# Patient Record
Sex: Male | Born: 1935 | Race: White | Hispanic: No | Smoking: Former smoker
Health system: Southern US, Community
[De-identification: ages and names within clinical notes are randomized; demographics above are authoritative.]

## PROBLEM LIST (undated history)

## (undated) DIAGNOSIS — M359 Systemic involvement of connective tissue, unspecified: Secondary | ICD-10-CM

## (undated) DIAGNOSIS — I10 Essential (primary) hypertension: Secondary | ICD-10-CM

## (undated) DIAGNOSIS — I509 Heart failure, unspecified: Secondary | ICD-10-CM

## (undated) DIAGNOSIS — C801 Malignant (primary) neoplasm, unspecified: Secondary | ICD-10-CM

## (undated) HISTORY — DX: Malignant (primary) neoplasm, unspecified: C80.1

## (undated) HISTORY — PX: APPENDECTOMY: SHX54

## (undated) HISTORY — PX: OTHER SURGICAL HISTORY: SHX169

## (undated) HISTORY — PX: PACEMAKER INSERTION: SHX728

## (undated) HISTORY — PX: CRANIOTOMY: SHX93

## (undated) HISTORY — PX: INGUINAL HERNIA REPAIR: SHX194

## (undated) HISTORY — PX: COLONOSCOPY: SHX5424

## (undated) HISTORY — DX: Essential (primary) hypertension: I10

---

## 1998-08-09 ENCOUNTER — Ambulatory Visit (HOSPITAL_COMMUNITY): Admission: RE | Admit: 1998-08-09 | Discharge: 1998-08-09 | Payer: Self-pay | Admitting: Internal Medicine

## 1998-08-09 ENCOUNTER — Encounter: Payer: Self-pay | Admitting: Internal Medicine

## 1999-11-19 ENCOUNTER — Encounter: Payer: Self-pay | Admitting: Internal Medicine

## 1999-11-19 ENCOUNTER — Ambulatory Visit (HOSPITAL_COMMUNITY): Admission: RE | Admit: 1999-11-19 | Discharge: 1999-11-19 | Payer: Self-pay | Admitting: Internal Medicine

## 2000-08-11 ENCOUNTER — Ambulatory Visit (HOSPITAL_COMMUNITY): Admission: RE | Admit: 2000-08-11 | Discharge: 2000-08-12 | Payer: Self-pay

## 2000-11-24 ENCOUNTER — Emergency Department (HOSPITAL_COMMUNITY): Admission: EM | Admit: 2000-11-24 | Discharge: 2000-11-24 | Payer: Self-pay | Admitting: *Deleted

## 2000-11-24 ENCOUNTER — Encounter: Payer: Self-pay | Admitting: *Deleted

## 2000-11-26 ENCOUNTER — Emergency Department (HOSPITAL_COMMUNITY): Admission: EM | Admit: 2000-11-26 | Discharge: 2000-11-26 | Payer: Self-pay | Admitting: Emergency Medicine

## 2000-11-28 ENCOUNTER — Emergency Department (HOSPITAL_COMMUNITY): Admission: EM | Admit: 2000-11-28 | Discharge: 2000-11-28 | Payer: Self-pay | Admitting: *Deleted

## 2000-12-08 ENCOUNTER — Emergency Department (HOSPITAL_COMMUNITY): Admission: EM | Admit: 2000-12-08 | Discharge: 2000-12-08 | Payer: Self-pay | Admitting: *Deleted

## 2002-02-25 ENCOUNTER — Encounter: Payer: Self-pay | Admitting: Internal Medicine

## 2002-02-25 ENCOUNTER — Ambulatory Visit (HOSPITAL_COMMUNITY): Admission: RE | Admit: 2002-02-25 | Discharge: 2002-02-26 | Payer: Self-pay | Admitting: Internal Medicine

## 2002-02-26 ENCOUNTER — Encounter: Payer: Self-pay | Admitting: Internal Medicine

## 2003-04-08 ENCOUNTER — Ambulatory Visit (HOSPITAL_COMMUNITY): Admission: RE | Admit: 2003-04-08 | Discharge: 2003-04-09 | Payer: Self-pay | Admitting: Cardiology

## 2004-02-08 ENCOUNTER — Ambulatory Visit: Payer: Self-pay | Admitting: *Deleted

## 2004-02-24 ENCOUNTER — Ambulatory Visit: Payer: Self-pay | Admitting: Gastroenterology

## 2004-03-02 ENCOUNTER — Ambulatory Visit: Payer: Self-pay | Admitting: Internal Medicine

## 2004-03-14 ENCOUNTER — Ambulatory Visit: Payer: Self-pay | Admitting: Gastroenterology

## 2004-03-14 ENCOUNTER — Ambulatory Visit (HOSPITAL_COMMUNITY): Admission: RE | Admit: 2004-03-14 | Discharge: 2004-03-14 | Payer: Self-pay | Admitting: Gastroenterology

## 2004-05-29 ENCOUNTER — Ambulatory Visit: Payer: Self-pay | Admitting: Internal Medicine

## 2004-05-31 ENCOUNTER — Ambulatory Visit: Payer: Self-pay | Admitting: Internal Medicine

## 2004-06-13 ENCOUNTER — Ambulatory Visit: Payer: Self-pay | Admitting: *Deleted

## 2004-06-13 ENCOUNTER — Ambulatory Visit: Payer: Self-pay | Admitting: Internal Medicine

## 2004-08-30 ENCOUNTER — Ambulatory Visit: Payer: Self-pay | Admitting: Internal Medicine

## 2004-10-10 ENCOUNTER — Ambulatory Visit: Payer: Self-pay | Admitting: *Deleted

## 2004-10-31 ENCOUNTER — Ambulatory Visit: Payer: Self-pay | Admitting: Internal Medicine

## 2004-10-31 ENCOUNTER — Ambulatory Visit: Payer: Self-pay

## 2004-11-23 ENCOUNTER — Ambulatory Visit: Payer: Self-pay | Admitting: Internal Medicine

## 2004-11-30 ENCOUNTER — Ambulatory Visit: Payer: Self-pay | Admitting: Internal Medicine

## 2005-01-24 ENCOUNTER — Ambulatory Visit: Payer: Self-pay | Admitting: Internal Medicine

## 2005-02-05 ENCOUNTER — Ambulatory Visit: Payer: Self-pay | Admitting: *Deleted

## 2005-03-01 ENCOUNTER — Ambulatory Visit: Payer: Self-pay | Admitting: Internal Medicine

## 2005-03-05 ENCOUNTER — Ambulatory Visit: Payer: Self-pay | Admitting: Internal Medicine

## 2005-05-02 ENCOUNTER — Ambulatory Visit: Payer: Self-pay | Admitting: Internal Medicine

## 2005-05-28 ENCOUNTER — Ambulatory Visit: Payer: Self-pay | Admitting: Internal Medicine

## 2005-05-30 ENCOUNTER — Ambulatory Visit: Payer: Self-pay | Admitting: Internal Medicine

## 2005-06-05 ENCOUNTER — Ambulatory Visit: Payer: Self-pay | Admitting: *Deleted

## 2005-06-07 ENCOUNTER — Ambulatory Visit: Payer: Self-pay | Admitting: Internal Medicine

## 2005-06-27 ENCOUNTER — Emergency Department (HOSPITAL_COMMUNITY): Admission: EM | Admit: 2005-06-27 | Discharge: 2005-06-28 | Payer: Self-pay | Admitting: Emergency Medicine

## 2005-07-02 ENCOUNTER — Ambulatory Visit: Payer: Self-pay | Admitting: *Deleted

## 2005-07-16 ENCOUNTER — Ambulatory Visit: Payer: Self-pay | Admitting: *Deleted

## 2005-07-30 ENCOUNTER — Ambulatory Visit: Payer: Self-pay | Admitting: Internal Medicine

## 2005-08-06 ENCOUNTER — Ambulatory Visit: Payer: Self-pay | Admitting: Internal Medicine

## 2005-09-05 ENCOUNTER — Ambulatory Visit: Payer: Self-pay | Admitting: Internal Medicine

## 2005-09-09 ENCOUNTER — Ambulatory Visit: Payer: Self-pay | Admitting: Internal Medicine

## 2005-09-11 ENCOUNTER — Ambulatory Visit: Payer: Self-pay | Admitting: *Deleted

## 2005-09-13 ENCOUNTER — Inpatient Hospital Stay (HOSPITAL_COMMUNITY): Admission: RE | Admit: 2005-09-13 | Discharge: 2005-09-14 | Payer: Self-pay | Admitting: Internal Medicine

## 2005-09-13 ENCOUNTER — Ambulatory Visit: Payer: Self-pay | Admitting: Internal Medicine

## 2005-11-05 ENCOUNTER — Ambulatory Visit: Payer: Self-pay | Admitting: Internal Medicine

## 2005-11-26 ENCOUNTER — Ambulatory Visit: Payer: Self-pay | Admitting: Internal Medicine

## 2005-11-28 ENCOUNTER — Ambulatory Visit: Payer: Self-pay | Admitting: Internal Medicine

## 2005-12-03 ENCOUNTER — Ambulatory Visit: Payer: Self-pay | Admitting: Internal Medicine

## 2005-12-10 ENCOUNTER — Ambulatory Visit: Payer: Self-pay | Admitting: Internal Medicine

## 2005-12-27 ENCOUNTER — Ambulatory Visit: Payer: Self-pay | Admitting: Internal Medicine

## 2006-01-14 ENCOUNTER — Ambulatory Visit: Payer: Self-pay | Admitting: *Deleted

## 2006-01-15 ENCOUNTER — Encounter: Admission: RE | Admit: 2006-01-15 | Discharge: 2006-01-15 | Payer: Self-pay | Admitting: Nephrology

## 2006-01-28 ENCOUNTER — Encounter: Payer: Self-pay | Admitting: Cardiology

## 2006-01-28 ENCOUNTER — Ambulatory Visit: Payer: Self-pay

## 2006-02-04 ENCOUNTER — Emergency Department (HOSPITAL_COMMUNITY): Admission: EM | Admit: 2006-02-04 | Discharge: 2006-02-05 | Payer: Self-pay | Admitting: Emergency Medicine

## 2006-02-05 ENCOUNTER — Ambulatory Visit: Payer: Self-pay | Admitting: Internal Medicine

## 2006-02-07 ENCOUNTER — Ambulatory Visit: Payer: Self-pay

## 2006-02-10 ENCOUNTER — Ambulatory Visit: Payer: Self-pay | Admitting: Cardiology

## 2006-02-10 ENCOUNTER — Inpatient Hospital Stay (HOSPITAL_COMMUNITY): Admission: RE | Admit: 2006-02-10 | Discharge: 2006-02-13 | Payer: Self-pay | Admitting: Urology

## 2006-02-10 ENCOUNTER — Encounter (INDEPENDENT_AMBULATORY_CARE_PROVIDER_SITE_OTHER): Payer: Self-pay | Admitting: *Deleted

## 2006-02-28 ENCOUNTER — Ambulatory Visit: Payer: Self-pay | Admitting: Internal Medicine

## 2006-05-05 ENCOUNTER — Ambulatory Visit: Payer: Self-pay | Admitting: *Deleted

## 2006-05-05 ENCOUNTER — Ambulatory Visit: Payer: Self-pay | Admitting: Internal Medicine

## 2006-05-29 ENCOUNTER — Ambulatory Visit: Payer: Self-pay | Admitting: Internal Medicine

## 2006-05-29 LAB — CONVERTED CEMR LAB
BUN: 29 mg/dL — ABNORMAL HIGH (ref 6–23)
Creatinine, Ser: 1.7 mg/dL — ABNORMAL HIGH (ref 0.4–1.5)
TSH: 3.72 microintl units/mL (ref 0.35–5.50)

## 2006-06-06 ENCOUNTER — Ambulatory Visit: Payer: Self-pay | Admitting: Internal Medicine

## 2006-08-05 ENCOUNTER — Ambulatory Visit: Payer: Self-pay | Admitting: *Deleted

## 2006-08-12 ENCOUNTER — Ambulatory Visit: Payer: Self-pay | Admitting: Internal Medicine

## 2006-08-26 ENCOUNTER — Ambulatory Visit: Payer: Self-pay | Admitting: Internal Medicine

## 2006-08-26 ENCOUNTER — Ambulatory Visit: Payer: Self-pay | Admitting: *Deleted

## 2006-08-26 LAB — CONVERTED CEMR LAB
ALT: 25 units/L (ref 0–40)
AST: 28 units/L (ref 0–37)
BUN: 29 mg/dL — ABNORMAL HIGH (ref 6–23)
Bilirubin, Direct: 0.2 mg/dL (ref 0.0–0.3)
Calcium: 8.9 mg/dL (ref 8.4–10.5)
Potassium: 4.2 meq/L (ref 3.5–5.1)
Sed Rate: 27 mm/hr — ABNORMAL HIGH (ref 0–20)
Sodium: 137 meq/L (ref 135–145)
Total Bilirubin: 1.1 mg/dL (ref 0.3–1.2)
Vit D, 1,25-Dihydroxy: 50 (ref 20–57)

## 2006-09-04 ENCOUNTER — Ambulatory Visit: Payer: Self-pay | Admitting: Internal Medicine

## 2006-09-09 ENCOUNTER — Ambulatory Visit: Payer: Self-pay | Admitting: Family Medicine

## 2006-11-11 ENCOUNTER — Ambulatory Visit: Payer: Self-pay | Admitting: Internal Medicine

## 2006-11-12 ENCOUNTER — Ambulatory Visit: Payer: Self-pay | Admitting: Internal Medicine

## 2006-11-12 LAB — CONVERTED CEMR LAB
Calcium: 9.5 mg/dL (ref 8.4–10.5)
Creatinine, Ser: 1.8 mg/dL — ABNORMAL HIGH (ref 0.4–1.5)
GFR calc Af Amer: 47 mL/min
Glucose, Bld: 96 mg/dL (ref 70–99)
Sodium: 138 meq/L (ref 135–145)

## 2007-01-07 ENCOUNTER — Encounter: Payer: Self-pay | Admitting: Internal Medicine

## 2007-01-08 DIAGNOSIS — E039 Hypothyroidism, unspecified: Secondary | ICD-10-CM

## 2007-01-08 DIAGNOSIS — I10 Essential (primary) hypertension: Secondary | ICD-10-CM

## 2007-01-30 ENCOUNTER — Encounter: Payer: Self-pay | Admitting: Internal Medicine

## 2007-02-10 ENCOUNTER — Ambulatory Visit: Payer: Self-pay | Admitting: Internal Medicine

## 2007-02-13 ENCOUNTER — Ambulatory Visit: Payer: Self-pay | Admitting: Internal Medicine

## 2007-02-13 LAB — CONVERTED CEMR LAB
Alkaline Phosphatase: 66 units/L (ref 39–117)
Calcium: 9.2 mg/dL (ref 8.4–10.5)
Cholesterol: 117 mg/dL (ref 0–200)
Creatinine, Ser: 2 mg/dL — ABNORMAL HIGH (ref 0.4–1.5)
Glucose, Bld: 92 mg/dL (ref 70–99)
Sodium: 139 meq/L (ref 135–145)
Total Bilirubin: 1 mg/dL (ref 0.3–1.2)
Total CHOL/HDL Ratio: 3.3
Total Protein: 6.8 g/dL (ref 6.0–8.3)
Triglycerides: 59 mg/dL (ref 0–149)

## 2007-03-09 ENCOUNTER — Ambulatory Visit: Payer: Self-pay | Admitting: Internal Medicine

## 2007-03-09 LAB — CONVERTED CEMR LAB
Chloride: 102 meq/L (ref 96–112)
GFR calc Af Amer: 47 mL/min
GFR calc non Af Amer: 39 mL/min

## 2007-05-12 ENCOUNTER — Ambulatory Visit: Payer: Self-pay | Admitting: Internal Medicine

## 2007-05-22 ENCOUNTER — Encounter: Admission: RE | Admit: 2007-05-22 | Discharge: 2007-05-22 | Payer: Self-pay | Admitting: Orthopedic Surgery

## 2007-06-09 ENCOUNTER — Ambulatory Visit: Payer: Self-pay | Admitting: Internal Medicine

## 2007-06-09 LAB — CONVERTED CEMR LAB
BUN: 45 mg/dL — ABNORMAL HIGH (ref 6–23)
Basophils Absolute: 0 10*3/uL (ref 0.0–0.1)
Calcium: 8.9 mg/dL (ref 8.4–10.5)
GFR calc Af Amer: 50 mL/min
GFR calc non Af Amer: 41 mL/min
Glucose, Bld: 90 mg/dL (ref 70–99)
Hemoglobin: 13.2 g/dL (ref 13.0–17.0)
Lymphocytes Relative: 11.1 % — ABNORMAL LOW (ref 12.0–46.0)
MCV: 90.3 fL (ref 78.0–100.0)
Monocytes Absolute: 2 10*3/uL — ABNORMAL HIGH (ref 0.2–0.7)
Monocytes Relative: 13.2 % — ABNORMAL HIGH (ref 3.0–11.0)
Potassium: 4.9 meq/L (ref 3.5–5.1)
RDW: 12.6 % (ref 11.5–14.6)
Vit D, 1,25-Dihydroxy: 36 (ref 30–89)

## 2007-06-15 ENCOUNTER — Ambulatory Visit: Payer: Self-pay | Admitting: Internal Medicine

## 2007-06-15 DIAGNOSIS — M545 Low back pain: Secondary | ICD-10-CM

## 2007-06-23 ENCOUNTER — Encounter: Payer: Self-pay | Admitting: Internal Medicine

## 2007-08-11 ENCOUNTER — Ambulatory Visit: Payer: Self-pay | Admitting: Internal Medicine

## 2007-09-08 ENCOUNTER — Ambulatory Visit: Payer: Self-pay | Admitting: Internal Medicine

## 2007-09-12 LAB — CONVERTED CEMR LAB
BUN: 26 mg/dL — ABNORMAL HIGH (ref 6–23)
Basophils Relative: 0.8 % (ref 0.0–1.0)
CO2: 30 meq/L (ref 19–32)
Calcium: 8.8 mg/dL (ref 8.4–10.5)
Creatinine, Ser: 1.4 mg/dL (ref 0.4–1.5)
Eosinophils Relative: 4.5 % (ref 0.0–5.0)
Glucose, Bld: 94 mg/dL (ref 70–99)
HCT: 35.3 % — ABNORMAL LOW (ref 39.0–52.0)
MCHC: 34.6 g/dL (ref 30.0–36.0)
MCV: 91.1 fL (ref 78.0–100.0)
Neutrophils Relative %: 60.7 % (ref 43.0–77.0)
Sodium: 140 meq/L (ref 135–145)

## 2007-09-18 ENCOUNTER — Ambulatory Visit: Payer: Self-pay | Admitting: Internal Medicine

## 2007-09-30 ENCOUNTER — Encounter: Payer: Self-pay | Admitting: Internal Medicine

## 2007-11-10 ENCOUNTER — Ambulatory Visit: Payer: Self-pay | Admitting: Internal Medicine

## 2007-12-14 ENCOUNTER — Ambulatory Visit: Payer: Self-pay | Admitting: Internal Medicine

## 2007-12-14 LAB — CONVERTED CEMR LAB
BUN: 34 mg/dL — ABNORMAL HIGH (ref 6–23)
Basophils Relative: 0.3 % (ref 0.0–3.0)
CO2: 27 meq/L (ref 19–32)
Chloride: 108 meq/L (ref 96–112)
Creatinine, Ser: 1.6 mg/dL — ABNORMAL HIGH (ref 0.4–1.5)
Glucose, Bld: 103 mg/dL — ABNORMAL HIGH (ref 70–99)
Lymphocytes Relative: 17.9 % (ref 12.0–46.0)
Neutrophils Relative %: 65.4 % (ref 43.0–77.0)
Potassium: 4.5 meq/L (ref 3.5–5.1)
RBC: 4.48 M/uL (ref 4.22–5.81)
RDW: 12.6 % (ref 11.5–14.6)
Sodium: 140 meq/L (ref 135–145)

## 2007-12-21 ENCOUNTER — Ambulatory Visit: Payer: Self-pay | Admitting: Internal Medicine

## 2008-01-25 ENCOUNTER — Encounter: Payer: Self-pay | Admitting: Internal Medicine

## 2008-01-27 ENCOUNTER — Ambulatory Visit: Payer: Self-pay

## 2008-04-26 ENCOUNTER — Ambulatory Visit: Payer: Self-pay | Admitting: Internal Medicine

## 2008-06-07 ENCOUNTER — Encounter: Payer: Self-pay | Admitting: Internal Medicine

## 2008-06-17 ENCOUNTER — Ambulatory Visit: Payer: Self-pay | Admitting: Internal Medicine

## 2008-06-17 LAB — CONVERTED CEMR LAB
Alkaline Phosphatase: 72 units/L (ref 39–117)
BUN: 37 mg/dL — ABNORMAL HIGH (ref 6–23)
Basophils Relative: 1.5 % (ref 0.0–3.0)
Bilirubin, Direct: 0.2 mg/dL (ref 0.0–0.3)
CO2: 28 meq/L (ref 19–32)
Creatinine, Ser: 1.9 mg/dL — ABNORMAL HIGH (ref 0.4–1.5)
Eosinophils Relative: 4.8 % (ref 0.0–5.0)
GFR calc non Af Amer: 36.09 mL/min (ref >60)
Glucose, Bld: 95 mg/dL (ref 70–99)
Lymphocytes Relative: 22.4 % (ref 12.0–46.0)
MCHC: 34.2 g/dL (ref 30.0–36.0)
MCV: 92.7 fL (ref 78.0–100.0)
Monocytes Relative: 15.7 % — ABNORMAL HIGH (ref 3.0–12.0)
Neutrophils Relative %: 55.6 % (ref 43.0–77.0)
Platelets: 142 10*3/uL — ABNORMAL LOW (ref 150.0–400.0)
Sodium: 137 meq/L (ref 135–145)
Total Protein: 6.9 g/dL (ref 6.0–8.3)

## 2008-06-21 ENCOUNTER — Ambulatory Visit: Payer: Self-pay | Admitting: Internal Medicine

## 2008-06-21 DIAGNOSIS — R413 Other amnesia: Secondary | ICD-10-CM | POA: Insufficient documentation

## 2008-06-28 ENCOUNTER — Inpatient Hospital Stay (HOSPITAL_COMMUNITY): Admission: EM | Admit: 2008-06-28 | Discharge: 2008-07-04 | Payer: Self-pay | Admitting: Emergency Medicine

## 2008-07-04 ENCOUNTER — Inpatient Hospital Stay: Admission: RE | Admit: 2008-07-04 | Discharge: 2008-07-26 | Payer: Self-pay | Admitting: Internal Medicine

## 2008-08-03 ENCOUNTER — Ambulatory Visit: Payer: Self-pay | Admitting: Internal Medicine

## 2008-08-16 ENCOUNTER — Encounter (INDEPENDENT_AMBULATORY_CARE_PROVIDER_SITE_OTHER): Payer: Self-pay | Admitting: *Deleted

## 2008-10-05 ENCOUNTER — Inpatient Hospital Stay (HOSPITAL_COMMUNITY): Admission: EM | Admit: 2008-10-05 | Discharge: 2008-10-10 | Payer: Self-pay | Admitting: Emergency Medicine

## 2008-10-10 ENCOUNTER — Inpatient Hospital Stay: Admission: RE | Admit: 2008-10-10 | Discharge: 2008-11-11 | Payer: Self-pay | Admitting: Internal Medicine

## 2008-10-14 ENCOUNTER — Encounter (INDEPENDENT_AMBULATORY_CARE_PROVIDER_SITE_OTHER): Payer: Self-pay | Admitting: *Deleted

## 2008-11-23 ENCOUNTER — Ambulatory Visit: Payer: Self-pay | Admitting: Internal Medicine

## 2008-11-23 LAB — CONVERTED CEMR LAB
BUN: 34 mg/dL — ABNORMAL HIGH (ref 6–23)
CO2: 28 meq/L (ref 19–32)
Chloride: 103 meq/L (ref 96–112)
Glucose, Bld: 96 mg/dL (ref 70–99)
TSH: 4.29 microintl units/mL (ref 0.35–5.50)

## 2008-11-25 ENCOUNTER — Ambulatory Visit: Payer: Self-pay | Admitting: Internal Medicine

## 2008-12-30 ENCOUNTER — Ambulatory Visit: Payer: Self-pay | Admitting: Internal Medicine

## 2008-12-30 DIAGNOSIS — Z95810 Presence of automatic (implantable) cardiac defibrillator: Secondary | ICD-10-CM

## 2009-01-30 ENCOUNTER — Encounter: Payer: Self-pay | Admitting: Internal Medicine

## 2009-03-25 HISTORY — PX: HIP FRACTURE SURGERY: SHX118

## 2009-04-07 ENCOUNTER — Ambulatory Visit: Payer: Self-pay | Admitting: Internal Medicine

## 2009-04-07 ENCOUNTER — Encounter: Payer: Self-pay | Admitting: Internal Medicine

## 2009-04-07 LAB — CONVERTED CEMR LAB
ALT: 16 units/L (ref 0–53)
BUN: 46 mg/dL — ABNORMAL HIGH (ref 6–23)
Basophils Absolute: 0.1 10*3/uL (ref 0.0–0.1)
Bilirubin Urine: NEGATIVE
CO2: 14 meq/L — ABNORMAL LOW (ref 19–32)
Chloride: 101 meq/L (ref 96–112)
Creatinine, Ser: 1.68 mg/dL — ABNORMAL HIGH (ref 0.40–1.50)
Eosinophils Absolute: 0.4 10*3/uL (ref 0.0–0.7)
Eosinophils Relative: 3.9 % (ref 0.0–5.0)
Glucose, Bld: 68 mg/dL — ABNORMAL LOW (ref 70–99)
Hemoglobin, Urine: NEGATIVE
Leukocytes, UA: NEGATIVE
MCHC: 31.9 g/dL (ref 30.0–36.0)
MCV: 94 fL (ref 78.0–100.0)
Monocytes Absolute: 1.6 10*3/uL — ABNORMAL HIGH (ref 0.1–1.0)
Neutrophils Relative %: 67.6 % (ref 43.0–77.0)
Nitrite: NEGATIVE
Potassium: 4.4 meq/L (ref 3.5–5.3)
RBC: 4.68 M/uL (ref 4.22–5.81)
Sed Rate: 43 mm/hr — ABNORMAL HIGH (ref 0–22)
Specific Gravity, Urine: 1.015 (ref 1.000–1.030)
TSH: 7.35 microintl units/mL — ABNORMAL HIGH (ref 0.35–5.50)
Total Protein: 7.6 g/dL (ref 6.0–8.3)
Urobilinogen, UA: 0.2 (ref 0.0–1.0)
WBC: 11 10*3/uL — ABNORMAL HIGH (ref 4.5–10.5)
pH: 5.5 (ref 5.0–8.0)

## 2009-04-14 ENCOUNTER — Ambulatory Visit: Payer: Self-pay | Admitting: Internal Medicine

## 2009-04-26 ENCOUNTER — Encounter: Payer: Self-pay | Admitting: Internal Medicine

## 2009-05-08 ENCOUNTER — Ambulatory Visit: Payer: Self-pay | Admitting: Internal Medicine

## 2009-05-08 LAB — CONVERTED CEMR LAB: TSH: 2.65 microintl units/mL (ref 0.35–5.50)

## 2009-05-11 ENCOUNTER — Ambulatory Visit: Payer: Self-pay | Admitting: Internal Medicine

## 2009-07-18 ENCOUNTER — Ambulatory Visit: Payer: Self-pay | Admitting: Internal Medicine

## 2009-07-19 ENCOUNTER — Encounter: Payer: Self-pay | Admitting: Internal Medicine

## 2009-07-20 ENCOUNTER — Telehealth: Payer: Self-pay | Admitting: Internal Medicine

## 2009-08-01 ENCOUNTER — Ambulatory Visit: Payer: Self-pay | Admitting: Internal Medicine

## 2009-08-03 ENCOUNTER — Ambulatory Visit: Payer: Self-pay | Admitting: Internal Medicine

## 2009-08-04 LAB — CONVERTED CEMR LAB
CO2: 26 meq/L (ref 19–32)
Creatinine, Ser: 1.5 mg/dL (ref 0.4–1.5)
GFR calc non Af Amer: 45.87 mL/min (ref >60)
Potassium: 4.5 meq/L (ref 3.5–5.1)

## 2009-08-10 ENCOUNTER — Ambulatory Visit: Payer: Self-pay | Admitting: Internal Medicine

## 2009-08-17 ENCOUNTER — Ambulatory Visit: Payer: Self-pay | Admitting: Internal Medicine

## 2009-08-18 LAB — CONVERTED CEMR LAB
Basophils Relative: 0.7 % (ref 0.0–3.0)
Calcium: 9.1 mg/dL (ref 8.4–10.5)
Chloride: 106 meq/L (ref 96–112)
Eosinophils Relative: 6 % — ABNORMAL HIGH (ref 0.0–5.0)
GFR calc non Af Amer: 42.35 mL/min (ref >60)
Glucose, Bld: 121 mg/dL — ABNORMAL HIGH (ref 70–99)
HCT: 38.5 % — ABNORMAL LOW (ref 39.0–52.0)
Hemoglobin: 13.1 g/dL (ref 13.0–17.0)
Monocytes Absolute: 1.3 10*3/uL — ABNORMAL HIGH (ref 0.1–1.0)
Monocytes Relative: 14.3 % — ABNORMAL HIGH (ref 3.0–12.0)
Neutrophils Relative %: 62.7 % (ref 43.0–77.0)
Platelets: 155 10*3/uL (ref 150.0–400.0)
Prothrombin Time: 11.5 s (ref 9.1–11.7)
RBC: 4.18 M/uL — ABNORMAL LOW (ref 4.22–5.81)
RDW: 12.9 % (ref 11.5–14.6)
Sodium: 139 meq/L (ref 135–145)
WBC: 8.9 10*3/uL (ref 4.5–10.5)

## 2009-08-24 ENCOUNTER — Ambulatory Visit: Payer: Self-pay | Admitting: Internal Medicine

## 2009-08-24 ENCOUNTER — Ambulatory Visit (HOSPITAL_COMMUNITY): Admission: RE | Admit: 2009-08-24 | Discharge: 2009-08-24 | Payer: Self-pay | Admitting: Internal Medicine

## 2009-09-07 ENCOUNTER — Encounter: Payer: Self-pay | Admitting: Internal Medicine

## 2009-09-07 ENCOUNTER — Ambulatory Visit: Payer: Self-pay

## 2009-10-06 ENCOUNTER — Ambulatory Visit: Payer: Self-pay | Admitting: Internal Medicine

## 2009-10-12 ENCOUNTER — Ambulatory Visit: Payer: Self-pay | Admitting: Internal Medicine

## 2009-11-22 ENCOUNTER — Ambulatory Visit: Payer: Self-pay | Admitting: Internal Medicine

## 2009-11-30 ENCOUNTER — Telehealth: Payer: Self-pay | Admitting: Internal Medicine

## 2009-12-19 ENCOUNTER — Ambulatory Visit: Payer: Self-pay | Admitting: Internal Medicine

## 2009-12-28 ENCOUNTER — Telehealth: Payer: Self-pay | Admitting: Internal Medicine

## 2010-01-12 ENCOUNTER — Encounter (INDEPENDENT_AMBULATORY_CARE_PROVIDER_SITE_OTHER): Payer: Self-pay | Admitting: *Deleted

## 2010-01-12 ENCOUNTER — Ambulatory Visit: Payer: Self-pay | Admitting: Internal Medicine

## 2010-01-12 LAB — CONVERTED CEMR LAB
ALT: 26 units/L (ref 0–53)
Albumin: 4 g/dL (ref 3.5–5.2)
Basophils Absolute: 0.1 10*3/uL (ref 0.0–0.1)
Basophils Relative: 1 % (ref 0.0–3.0)
Bilirubin Urine: NEGATIVE
CO2: 28 meq/L (ref 19–32)
Calcium: 9.5 mg/dL (ref 8.4–10.5)
Chloride: 105 meq/L (ref 96–112)
Cholesterol: 112 mg/dL (ref 0–200)
Creatinine, Ser: 1.6 mg/dL — ABNORMAL HIGH (ref 0.4–1.5)
Eosinophils Absolute: 0.5 10*3/uL (ref 0.0–0.7)
Eosinophils Relative: 5.1 % — ABNORMAL HIGH (ref 0.0–5.0)
HDL: 38.2 mg/dL — ABNORMAL LOW (ref >39.00)
Hemoglobin, Urine: NEGATIVE
Hemoglobin: 13.7 g/dL (ref 13.0–17.0)
Ketones, ur: NEGATIVE mg/dL
LDL Cholesterol: 55 mg/dL (ref 0–99)
Lymphocytes Relative: 19.3 % (ref 12.0–46.0)
Lymphs Abs: 1.9 10*3/uL (ref 0.7–4.0)
MCHC: 34.1 g/dL (ref 30.0–36.0)
Monocytes Absolute: 1.4 10*3/uL — ABNORMAL HIGH (ref 0.1–1.0)
Monocytes Relative: 14.9 % — ABNORMAL HIGH (ref 3.0–12.0)
Neutrophils Relative %: 59.7 % (ref 43.0–77.0)
Nitrite: NEGATIVE
Potassium: 5.6 meq/L — ABNORMAL HIGH (ref 3.5–5.1)
Sodium: 142 meq/L (ref 135–145)
Total Bilirubin: 1.1 mg/dL (ref 0.3–1.2)
Total CHOL/HDL Ratio: 3
Total Protein, Urine: NEGATIVE mg/dL
Triglycerides: 94 mg/dL (ref 0.0–149.0)
Urine Glucose: NEGATIVE mg/dL
Urobilinogen, UA: 0.2 (ref 0.0–1.0)
VLDL: 18.8 mg/dL (ref 0.0–40.0)
WBC: 9.7 10*3/uL (ref 4.5–10.5)
pH: 6 (ref 5.0–8.0)

## 2010-01-17 ENCOUNTER — Ambulatory Visit: Payer: Self-pay | Admitting: Internal Medicine

## 2010-03-22 ENCOUNTER — Ambulatory Visit
Admission: RE | Admit: 2010-03-22 | Discharge: 2010-03-22 | Payer: Self-pay | Source: Home / Self Care | Attending: Internal Medicine | Admitting: Internal Medicine

## 2010-03-22 ENCOUNTER — Encounter: Payer: Self-pay | Admitting: Internal Medicine

## 2010-03-30 LAB — CONVERTED CEMR LAB: CO2: 21 meq/L (ref 19–32)

## 2010-04-04 ENCOUNTER — Encounter (INDEPENDENT_AMBULATORY_CARE_PROVIDER_SITE_OTHER): Payer: Self-pay | Admitting: *Deleted

## 2010-04-26 NOTE — Letter (Signed)
Summary: Implantable Device Instructions  [facility], Main Office  [address]   [place], [place] [address]   Phone: [phone]  Fax: [phone]      Implantable Device Instructions  You are scheduled for:   _____ Generator Change  on 08/25/09 with Dr. Ladona Ridgel.  1.  Please arrive at the Short Stay Center at [facility] at 8:00am on the day of your procedure.  2.  Do not eat or drinkafter midnight the night before your procedure.  3.  Complete lab work on 08/17/09 at 10:00am.   You do not have to be fasting  4.  Plan for an overnight stay.  Bring your insurance cards and a list of your medications.  5.  Wash your chest and neck with antibacterial soap (any brand) the evening before and the morning of your procedure.  Rinse well.    *If you have ANY questions after you get home, please call the office [phone]. Dennis Bast RN  *Every attempt is made to prevent procedures from being rescheduled.  Due to the nauture of Electrophysiology, rescheduling can happen.  The physician is always aware and directs the staff when this occurs.    Appended Document: Implantable Device Instructions procedure date is 08/24/09

## 2010-04-26 NOTE — Assessment & Plan Note (Signed)
Summary: 2 mos f/u // # cd   Vital Signs:  Patient profile:   77 year old male Height:      67 inches Weight:      144 pounds BMI:     22.64 O2 Sat:      98 % on Room air Temp:     98.1 degrees F oral Pulse rate:   79 / minute Pulse rhythm:   regular Resp:     16 per minute BP sitting:   112 / 68  (left arm) Cuff size:   regular  Vitals Entered By: Lanier Prude, CMA(AAMA) (October 12, 2009 9:49 AM)  O2 Flow:  Room air CC: 2 mo f/u Is Patient Diabetic? No   CC:  2 mo f/u.  History of Present Illness: F/u hypothyroidism  Current Medications (verified): 1)  Lipitor 20 Mg  Tabs (Atorvastatin Calcium) .... 1/2 Tab By Mouth Qd 2)  Nasacort Aq 55 Mcg/act  Aers (Triamcinolone Acetonide(Nasal)) .... Each Nostril Once Daily 3)  Nitrostat 0.4 Mg Subl (Nitroglycerin) .Marland Kitchen.. 1 Tablet Under Tongue At Onset of Chest Pain; You May Repeat Every 5 Minutes For Up To 3 Doses. 4)  Metoprolol Succinate 50 Mg  Tb24 (Metoprolol Succinate) .... 1/2 Two Times A Day 5)  Osteo Bi-Flex Adv Double St   Tabs (Misc Natural Products) .... Once Daily 6)  Aspir-Low 81 Mg Tbec (Aspirin) .Marland Kitchen.. 1 Once Daily Pc 7)  Vitamin D3 1000 Unit  Tabs (Cholecalciferol) .Marland Kitchen.. 1 By Mouth Daily 8)  Aricept 10 Mg Tabs (Donepezil Hydrochloride) .... Take 1 Tab Daily 9)  Folic Acid 800 Mcg Tabs (Folic Acid) .... Take 1 By Mouth Once Daily 10)  Levoxyl 112 Mcg Tabs (Levothyroxine Sodium) .Marland Kitchen.. 1 By Mouth Qd  Allergies (verified): No Known Drug Allergies  Past History:  Past Medical History: Last updated: 05/11/2009 Coronary artery disease Dr Ladona Ridgel Hypertension Hypothyroidism Osteoarthritis Osteoporosis (took Actonel x 5 y) Benign prostatic hypertrophy Dr Vonita Moss Renal insufficiency   Dr Briant Cedar Low back pain 2009 Hip fracture x2 on R Sternum fracture 2011  Social History: Last updated: 02/13/2007 Retired Former Smoker Alcohol use-no Regular exercise-no Married  Review of Systems       The patient  complains of weight loss.  The patient denies chest pain, syncope, and abdominal pain.    Physical Exam  General:  Well-developed,well-nourished,in no acute distress; alert,appropriate and cooperative throughout examination Mouth:  Oral mucosa and oropharynx without lesions or exudates.  Teeth in good repair. Lungs:  Normal respiratory effort, chest expands symmetrically. Lungs are clear to auscultation, no crackles or wheezes. Heart:  Normal rate and regular rhythm. S1 and S2 normal without gallop, murmur, click, rub or other extra sounds. Abdomen:  Bowel sounds positive,abdomen soft and non-tender without masses, organomegaly or hernias noted. Msk:  No deformity or scoliosis noted of thoracic or lumbar spine.  Using a walker. R hip is not tender Neurologic:  No cranial nerve deficits noted. Station and gait are normal. Skin:  No bruises Psych:  Cognition and judgment appear intact. Alert and cooperative with normal attention span and concentration. No apparent delusions, illusions, hallucinations   Impression & Recommendations:  Problem # 1:  HYPOTHYROIDISM (ICD-244.9) Assessment Comment Only  The following medications were removed from the medication list:    Levoxyl 112 Mcg Tabs (Levothyroxine sodium) .Marland Kitchen... 1 by mouth qd His updated medication list for this problem includes:    Levoxyl 100 Mcg Tabs (Levothyroxine sodium) .Marland Kitchen... 1 by mouth qd  Labs Reviewed:  TSH: 0.08 (10/06/2009)    Chol: 117 (02/13/2007)   HDL: 35.0 (02/13/2007)   LDL: 70 (02/13/2007)   TG: 59 (02/13/2007)  Problem # 2:  Wt loss Assessment: Comment Only Poss due to low TSH  Complete Medication List: 1)  Lipitor 20 Mg Tabs (Atorvastatin calcium) .... 1/2 tab by mouth qd 2)  Nasacort Aq 55 Mcg/act Aers (Triamcinolone acetonide(nasal)) .... Each nostril once daily 3)  Nitrostat 0.4 Mg Subl (Nitroglycerin) .Marland Kitchen.. 1 tablet under tongue at onset of chest pain; you may repeat every 5 minutes for up to 3 doses. 4)   Metoprolol Succinate 50 Mg Tb24 (Metoprolol succinate) .... 1/2 two times a day 5)  Osteo Bi-flex Adv Double St Tabs (Misc natural products) .... Once daily 6)  Aspir-low 81 Mg Tbec (Aspirin) .Marland Kitchen.. 1 once daily pc 7)  Vitamin D3 1000 Unit Tabs (Cholecalciferol) .Marland Kitchen.. 1 by mouth daily 8)  Aricept 10 Mg Tabs (Donepezil hydrochloride) .... Take 1 tab daily 9)  Folic Acid 800 Mcg Tabs (Folic acid) .... Take 1 by mouth once daily 10)  Levoxyl 100 Mcg Tabs (Levothyroxine sodium) .Marland Kitchen.. 1 by mouth qd  Other Orders: Prescription Created Electronically [id number])  Patient Instructions: 1)  Please schedule a follow-up appointment in 3 months well w/labs. 2)  TSH prior to visit, ICD-9:244.8 - in 6 wks 3)  Take Synthroid samples 200 micrograms 1/2 a day Prescriptions: NASACORT AQ 55 MCG/ACT  AERS (TRIAMCINOLONE ACETONIDE(NASAL)) each nostril once daily  #3 x 4   Entered and Authorized by:   Tresa Garter MD   Signed by:   Tresa Garter MD on 10/12/2009   Method used:   Electronically to        [facility]* (retail)             ,          Ph: [phone]       Fax: [phone]   RxID:   [id number]

## 2010-04-26 NOTE — Letter (Signed)
Summary: Remote Device Check  [employer], Main Office  [address]   [place], [place] [address]   Phone: [phone]  Fax: [phone]     April 04, 2010 MRN: [id number]   NEWT Meester [address] [place], [place]  [address]   Dear Mr. FJELD,   Your remote transmission was recieved and reviewed by your physician.  All diagnostics were within normal limits for you.  __X___Your next transmission is scheduled for:   06-21-2010.  Please transmit at any time this day.  If you have a wireless device your transmission will be sent automatically.   Sincerely,  Vella Kohler

## 2010-04-26 NOTE — Cardiovascular Report (Signed)
Summary: Office Visit   Office Visit   Imported By: Roderic Ovens 12/20/2009 15:45:46  _____________________________________________________________________  External Attachment:    Type:   Image     Comment:   External Document

## 2010-04-26 NOTE — Assessment & Plan Note (Signed)
Summary: PER VANESSA 4 WK APPT--STC   Vital Signs:  Patient profile:   77 year old male Weight:      154 pounds Temp:     96.8 degrees F oral Pulse rate:   52 / minute BP sitting:   112 / 70  (left arm)  Vitals Entered By: Tora Perches (May 11, 2009 10:29 AM) CC: f/u Is Patient Diabetic? No   CC:  f/u.  History of Present Illness: F/u sternum fracture and abn thyroid, HTN. Pain is getting better.  Preventive Screening-Counseling & Management  Alcohol-Tobacco     Smoking Status: quit  Current Medications (verified): 1)  Lipitor 20 Mg  Tabs (Atorvastatin Calcium) .... 1/2  Two Times A Day 2)  Nasacort Aq 55 Mcg/act  Aers (Triamcinolone Acetonide(Nasal)) .... Each Nostril Once Daily 3)  Nitrostat 0.4 Mg Subl (Nitroglycerin) .Marland Kitchen.. 1 Tablet Under Tongue At Onset of Chest Pain; You May Repeat Every 5 Minutes For Up To 3 Doses. 4)  Metoprolol Succinate 50 Mg  Tb24 (Metoprolol Succinate) .... 1/2 Two Times A Day 5)  Osteo Bi-Flex Adv Double St   Tabs (Misc Natural Products) .... Once Daily 6)  Aspir-Low 81 Mg Tbec (Aspirin) .Marland Kitchen.. 1 Once Daily Pc 7)  Vitamin D3 1000 Unit  Tabs (Cholecalciferol) .Marland Kitchen.. 1 By Mouth Daily 8)  Aricept 10 Mg Tabs (Donepezil Hydrochloride) .... Take 1 Tab Daily 9)  Folic Acid 800 Mcg Tabs (Folic Acid) .... Take 1 By Mouth Once Daily 10)  Levothroid 100 Mcg Tabs (Levothyroxine Sodium) .Marland Kitchen.. 1 By Mouth Once Daily For Thyroid  Allergies (verified): No Known Drug Allergies  Past History:  Social History: Last updated: 02/13/2007 Retired Former Smoker Alcohol use-no Regular exercise-no Married  Past Medical History: Coronary artery disease Dr Ladona Ridgel Hypertension Hypothyroidism Osteoarthritis Osteoporosis (took Actonel x 5 y) Benign prostatic hypertrophy Dr Vonita Moss Renal insufficiency   Dr Briant Cedar Low back pain 2009 Hip fracture x2 on R Sternum fracture 2011  Social History: Smoking Status:  quit  Review of Systems       The patient  complains of chest pain.  The patient denies dyspnea on exertion.    Physical Exam  General:  Well-developed,well-nourished,in no acute distress; alert,appropriate and cooperative throughout examination Mouth:  Oral mucosa and oropharynx without lesions or exudates.  Teeth in good repair. Neck:  No deformities, masses, or tenderness noted. Chest Wall:  less tender sernum, no deformitie or crepitus Lungs:  Normal respiratory effort, chest expands symmetrically. Lungs are clear to auscultation, no crackles or wheezes. Heart:  Normal rate and regular rhythm. S1 and S2 normal without gallop, murmur, click, rub or other extra sounds. Abdomen:  Bowel sounds positive,abdomen soft and non-tender without masses, organomegaly or hernias noted. Msk:  No deformity or scoliosis noted of thoracic or lumbar spine.  Using a walker. R hip is not tender Neurologic:  No cranial nerve deficits noted. Station and gait are normal. Skin:  No bruises Psych:  Cognition and judgment appear intact. Alert and cooperative with normal attention span and concentration. No apparent delusions, illusions, hallucinations   Impression & Recommendations:  Problem # 1:  HYPOTHYROIDISM (ICD-244.9)  The following medications were removed from the medication list:    Levothroid 100 Mcg Tabs (Levothyroxine sodium) .Marland Kitchen... 1 by mouth once daily for thyroid His updated medication list for this problem includes:    Levoxyl 125 Mcg Tabs (Levothyroxine sodium) .Marland Kitchen... 1po qd  Problem # 2:  HYPERTENSION (ICD-401.9)  His updated medication list for this problem  includes:    Metoprolol Succinate 50 Mg Tb24 (Metoprolol succinate) .Marland Kitchen... 1/2 two times a day  Problem # 3:  CHEST WALL PAIN, ACUTE (ICD-786.52) Assessment: Improved  His updated medication list for this problem includes:    Nitrostat 0.4 Mg Subl (Nitroglycerin) .Marland Kitchen... 1 tablet under tongue at onset of chest pain; you may repeat every 5 minutes for up to 3 doses.    Aspir-low  81 Mg Tbec (Aspirin) .Marland Kitchen... 1 once daily pc The xrays were reviewed with the patient.   Problem # 4:  MEMORY LOSS (ICD-780.93) Assessment: Improved  Complete Medication List: 1)  Lipitor 20 Mg Tabs (Atorvastatin calcium) .... 1/2  two times a day 2)  Nasacort Aq 55 Mcg/act Aers (Triamcinolone acetonide(nasal)) .... Each nostril once daily 3)  Nitrostat 0.4 Mg Subl (Nitroglycerin) .Marland Kitchen.. 1 tablet under tongue at onset of chest pain; you may repeat every 5 minutes for up to 3 doses. 4)  Metoprolol Succinate 50 Mg Tb24 (Metoprolol succinate) .... 1/2 two times a day 5)  Osteo Bi-flex Adv Double St Tabs (Misc natural products) .... Once daily 6)  Aspir-low 81 Mg Tbec (Aspirin) .Marland Kitchen.. 1 once daily pc 7)  Vitamin D3 1000 Unit Tabs (Cholecalciferol) .Marland Kitchen.. 1 by mouth daily 8)  Aricept 10 Mg Tabs (Donepezil hydrochloride) .... Take 1 tab daily 9)  Folic Acid 800 Mcg Tabs (Folic acid) .... Take 1 by mouth once daily 10)  Levoxyl 125 Mcg Tabs (Levothyroxine sodium) .Marland Kitchen.. 1po qd  Patient Instructions: 1)  Please schedule a follow-up appointment in 3 months. 2)  BMP prior to visit, ICD-9: 3)  TSH prior to visit, ICD-9:244.8 Prescriptions: LEVOXYL 125 MCG TABS (LEVOTHYROXINE SODIUM) 1po qd  #90 x 3   Entered and Authorized by:   Tresa Garter MD   Signed by:   Tresa Garter MD on 05/11/2009   Method used:   Electronically to        [facility]* (mail-order)             ,          Ph: [phone]       Fax: [phone]   RxID:   [id number]

## 2010-04-26 NOTE — Progress Notes (Signed)
Summary: ICD at [facility]  Phone Note Outgoing Call Call back at [facility] Phone [phone]   Call placed by: Gypsy Balsam RN BSN,  July 20, 2009 3:44 PM Summary of Call: No answer at patient's home, attempted to call about ICD being at [facility].  Pt needs appt with Dr Ladona Ridgel to discuss. Gypsy Balsam RN BSN  July 20, 2009 3:44 PM   Follow-up for Phone Call        Spoke with patient.  Appt made for 5-10 with Dr Ladona Ridgel to discuss.  He does not hear device beeping. Gypsy Balsam RN BSN  July 21, 2009 6:38 PM

## 2010-04-26 NOTE — Cardiovascular Report (Signed)
Summary: Office Visit Remote   Office Visit Remote   Imported By: Roderic Ovens 08/07/2009 15:32:57  _____________________________________________________________________  External Attachment:    Type:   Image     Comment:   External Document

## 2010-04-26 NOTE — Assessment & Plan Note (Signed)
Summary: 3 MTH FU--STC   Vital Signs:  Patient profile:   77 year old male Height:      67 inches Weight:      150 pounds BMI:     23.58 O2 Sat:      97 % on Room air Temp:     97.1 degrees F oral Pulse rate:   54 / minute BP sitting:   120 / 56  (left arm) Cuff size:   regular  Vitals Entered By: Lucious Groves (Aug 10, 2009 10:21 AM)  O2 Flow:  Room air Comments Patient notes he has upcoming defib. replacement./kb   History of Present Illness: The patient presents for a follow up of hypertension, OA, hyperlipidemia   Current Medications (verified): 1)  Lipitor 20 Mg  Tabs (Atorvastatin Calcium) .... 1/2 Tab By Mouth Qd 2)  Nasacort Aq 55 Mcg/act  Aers (Triamcinolone Acetonide(Nasal)) .... Each Nostril Once Daily 3)  Nitrostat 0.4 Mg Subl (Nitroglycerin) .Marland Kitchen.. 1 Tablet Under Tongue At Onset of Chest Pain; You May Repeat Every 5 Minutes For Up To 3 Doses. 4)  Metoprolol Succinate 50 Mg  Tb24 (Metoprolol Succinate) .... 1/2 Two Times A Day 5)  Osteo Bi-Flex Adv Double St   Tabs (Misc Natural Products) .... Once Daily 6)  Aspir-Low 81 Mg Tbec (Aspirin) .Marland Kitchen.. 1 Once Daily Pc 7)  Vitamin D3 1000 Unit  Tabs (Cholecalciferol) .Marland Kitchen.. 1 By Mouth Daily 8)  Aricept 10 Mg Tabs (Donepezil Hydrochloride) .... Take 1 Tab Daily 9)  Folic Acid 800 Mcg Tabs (Folic Acid) .... Take 1 By Mouth Once Daily 10)  Levoxyl 125 Mcg Tabs (Levothyroxine Sodium) .Marland Kitchen.. 1 By Mouth Qd 11)  Nasacort Aq 55 Mcg/act Aers (Triamcinolone Acetonide(Nasal)) .... Uad  Allergies (verified): No Known Drug Allergies  Past History:  Past Medical History: Last updated: 05/11/2009 Coronary artery disease Dr Ladona Ridgel Hypertension Hypothyroidism Osteoarthritis Osteoporosis (took Actonel x 5 y) Benign prostatic hypertrophy Dr Vonita Moss Renal insufficiency   Dr Briant Cedar Low back pain 2009 Hip fracture x2 on R Sternum fracture 2011  Social History: Last updated: 02/13/2007 Retired Former Smoker Alcohol use-no Regular  exercise-no Married  Review of Systems  The patient denies fever, chest pain, syncope, dyspnea on exertion, and abdominal pain.    Physical Exam  General:  Well-developed,well-nourished,in no acute distress; alert,appropriate and cooperative throughout examination Nose:  External nasal examination shows no deformity or inflammation. Nasal mucosa are pink and moist without lesions or exudates. Mouth:  Oral mucosa and oropharynx without lesions or exudates.  Teeth in good repair. Lungs:  Normal respiratory effort, chest expands symmetrically. Lungs are clear to auscultation, no crackles or wheezes. Heart:  Normal rate and regular rhythm. S1 and S2 normal without gallop, murmur, click, rub or other extra sounds. Abdomen:  Bowel sounds positive,abdomen soft and non-tender without masses, organomegaly or hernias noted. Msk:  No deformity or scoliosis noted of thoracic or lumbar spine.  Using a walker. R hip is not tender Skin:  No bruises   Impression & Recommendations:  Problem # 1:  HYPOTHYROIDISM (ICD-244.9) Assessment Deteriorated The labs were reviewed with the patient.  The following medications were removed from the medication list:    Levoxyl 125 Mcg Tabs (Levothyroxine sodium) .Marland Kitchen... 1 by mouth qd His updated medication list for this problem includes:    Levoxyl 112 Mcg Tabs (Levothyroxine sodium) .Marland Kitchen... 1 by mouth qd  Orders: Prescription Created Electronically [id number])  Problem # 2:  MEMORY LOSS (ICD-780.93) Assessment: Improved  Problem # 3:  RENAL INSUFFICIENCY (ICD-588.9) Assessment: Unchanged  Problem # 4:  HYPERTENSION (ICD-401.9) Assessment: Unchanged  His updated medication list for this problem includes:    Metoprolol Succinate 50 Mg Tb24 (Metoprolol succinate) .Marland Kitchen... 1/2 two times a day  BP today: 120/56 Prior BP: 130/70 (08/01/2009)  Labs Reviewed: K+: 4.5 (08/03/2009) Creat: : 1.5 (08/03/2009)   Chol: 117 (02/13/2007)   HDL: 35.0 (02/13/2007)   LDL: 70  (02/13/2007)   TG: 59 (02/13/2007)  Problem # 5:  CORONARY ARTERY DISEASE (ICD-414.00) Assessment: Unchanged  His updated medication list for this problem includes:    Nitrostat 0.4 Mg Subl (Nitroglycerin) .Marland Kitchen... 1 tablet under tongue at onset of chest pain; you may repeat every 5 minutes for up to 3 doses.    Metoprolol Succinate 50 Mg Tb24 (Metoprolol succinate) .Marland Kitchen... 1/2 two times a day    Aspir-low 81 Mg Tbec (Aspirin) .Marland Kitchen... 1 once daily pc  Complete Medication List: 1)  Lipitor 20 Mg Tabs (Atorvastatin calcium) .... 1/2 tab by mouth qd 2)  Nasacort Aq 55 Mcg/act Aers (Triamcinolone acetonide(nasal)) .... Each nostril once daily 3)  Nitrostat 0.4 Mg Subl (Nitroglycerin) .Marland Kitchen.. 1 tablet under tongue at onset of chest pain; you may repeat every 5 minutes for up to 3 doses. 4)  Metoprolol Succinate 50 Mg Tb24 (Metoprolol succinate) .... 1/2 two times a day 5)  Osteo Bi-flex Adv Double St Tabs (Misc natural products) .... Once daily 6)  Aspir-low 81 Mg Tbec (Aspirin) .Marland Kitchen.. 1 once daily pc 7)  Vitamin D3 1000 Unit Tabs (Cholecalciferol) .Marland Kitchen.. 1 by mouth daily 8)  Aricept 10 Mg Tabs (Donepezil hydrochloride) .... Take 1 tab daily 9)  Folic Acid 800 Mcg Tabs (Folic acid) .... Take 1 by mouth once daily 10)  Nasacort Aq 55 Mcg/act Aers (Triamcinolone acetonide(nasal)) .... Uad 11)  Levoxyl 112 Mcg Tabs (Levothyroxine sodium) .Marland Kitchen.. 1 by mouth qd  Patient Instructions: 1)  Please schedule a follow-up appointment in 2 months. 2)  TSH prior to visit, ICD-9: 244.8 Prescriptions: LEVOXYL 112 MCG TABS (LEVOTHYROXINE SODIUM) 1 by mouth qd  #90 x 3   Entered and Authorized by:   Tresa Garter MD   Signed by:   Tresa Garter MD on 08/10/2009   Method used:   Electronically to        [facility]* (mail-order)             ,          Ph: [phone]       Fax: [phone]   RxID:   [id number]

## 2010-04-26 NOTE — Assessment & Plan Note (Signed)
Summary: 4 MO ROV /NWS  $50   Vital Signs:  Patient profile:   77 year old [name] Weight:      155 pounds Temp:     96.8 degrees F oral Pulse rate:   49 / minute BP sitting:   112 / 64  (left arm)  Vitals Entered By: Tora Perches (April 07, 2009 10:03 AM) CC: f/u Is Patient Diabetic? No   CC:  f/u.  History of Present Illness: The patient presents for a follow up of hypertension, CAD, hyperlipidemia. C/o mid CP x 1 wk after he fell on a night stand (got dizzy)   Preventive Screening-Counseling & Management  Alcohol-Tobacco     Smoking Status: quit < 6 months  Current Medications (verified): 1)  Levoxyl 75 Mcg Tabs (Levothyroxine Sodium) .Marland Kitchen.. 1 By Mouth Qd 2)  Lipitor 20 Mg  Tabs (Atorvastatin Calcium) .... 1/2  Two Times A Day 3)  Nasacort Aq [name] Mcg/act  Aers (Triamcinolone Acetonide(Nasal)) .... Each Nostril Once Daily 4)  Nitrostat 0.4 Mg Subl (Nitroglycerin) .Marland Kitchen.. 1 Tablet Under Tongue At Onset of Chest Pain; You May Repeat Every 5 Minutes For Up To 3 Doses. 5)  Metoprolol Succinate 50 Mg  Tb24 (Metoprolol Succinate) .... 1/2 Two Times A Day 6)  Osteo Bi-Flex Adv Double St   Tabs (Misc Natural Products) .... Once Daily 7)  Aspir-Low 81 Mg Tbec (Aspirin) .Marland Kitchen.. 1 Once Daily Pc 8)  Vitamin D3 1000 Unit  Tabs (Cholecalciferol) .Marland Kitchen.. 1 By Mouth Daily 9)  Aricept 10 Mg Tabs (Donepezil Hydrochloride) .... Take 1 Tab Daily 10)  Folic Acid 800 Mcg Tabs (Folic Acid) .... Take 1 By Mouth Once Daily  Allergies (verified): No Known Drug Allergies  Past History:  Past Medical History: Coronary artery disease Dr Ladona Ridgel Hypertension Hypothyroidism Osteoarthritis Osteoporosis (took Actonel x 5 y) Benign prostatic hypertrophy Dr Vonita Moss Renal insufficiency   Dr Briant Cedar Low back pain 2009 Hip fracture x2 on R  Social History: Smoking Status:  quit < 6 months  Physical Exam  General:  Well-developed,well-nourished,in no acute distress; alert,appropriate and cooperative  throughout examination Eyes:  No corneal or conjunctival inflammation noted. EOMI. Perrla.  Nose:  External nasal examination shows no deformity or inflammation. Nasal mucosa are pink and moist without lesions or exudates. Mouth:  Oral mucosa and oropharynx without lesions or exudates.  Teeth in good repair. Neck:  No deformities, masses, or tenderness noted. Chest Wall:  tender sernum, no deformitie or crepitus Lungs:  Normal respiratory effort, chest expands symmetrically. Lungs are clear to auscultation, no crackles or wheezes. Heart:  Normal rate and regular rhythm. S1 and S2 normal without gallop, murmur, click, rub or other extra sounds. Abdomen:  Bowel sounds positive,abdomen soft and non-tender without masses, organomegaly or hernias noted. Msk:  No deformity or scoliosis noted of thoracic or lumbar spine.  Using a walker. R hip is not tender Neurologic:  No cranial nerve deficits noted. Station and gait are normal. Skin:  No bruises Psych:  Cognition and judgment appear intact. Alert and cooperative with normal attention span and concentration. No apparent delusions, illusions, hallucinations   Impression & Recommendations:  Problem # 1:  CHEST WALL PAIN, ACUTE (ICD-786.52) r/o sternal/rib fx  Assessment New  His updated medication list for this problem includes:    Nitrostat 0.4 Mg Subl (Nitroglycerin) .Marland Kitchen... 1 tablet under tongue at onset of chest pain; you may repeat every 5 minutes for up to 3 doses.    Aspir-low 81 Mg Tbec (  Aspirin) .Marland Kitchen... 1 once daily pc  Orders: T-Sternum (71120TC) TLB-BMP (Basic Metabolic Panel-BMET) (80048-METABOL) TLB-CBC Platelet - w/Differential (85025-CBCD) TLB-Hepatic/Liver Function Pnl (80076-HEPATIC) TLB-Sedimentation Rate (ESR) (85652-ESR) TLB-TSH (Thyroid Stimulating Hormone) (84443-TSH) TLB-Udip ONLY (81003-UDIP)  Problem # 2:  LOW BACK PAIN (ICD-724.2) Assessment: Comment Only  His updated medication list for this problem includes:     Aspir-low 81 Mg Tbec (Aspirin) .Marland Kitchen... 1 once daily pc  Problem # 3:  OSTEOPOROSIS (ICD-733.00) Assessment: Comment Only We talked about Prolia -not approved for men yet  Problem # 4:  CORONARY ARTERY DISEASE (ICD-414.00) Assessment: Unchanged  His updated medication list for this problem includes:    Nitrostat 0.4 Mg Subl (Nitroglycerin) .Marland Kitchen... 1 tablet under tongue at onset of chest pain; you may repeat every 5 minutes for up to 3 doses.    Metoprolol Succinate 50 Mg Tb24 (Metoprolol succinate) .Marland Kitchen... 1/2 two times a day    Aspir-low 81 Mg Tbec (Aspirin) .Marland Kitchen... 1 once daily pc  Problem # 5:  HYPOTHYROIDISM (ICD-244.9) Assessment: Deteriorated  The following medications were removed from the medication list:    Levoxyl 75 Mcg Tabs (Levothyroxine sodium) .Marland Kitchen... 1 by mouth qd His updated medication list for this problem includes:    Levothroid 100 Mcg Tabs (Levothyroxine sodium) .Marland Kitchen... 1 by mouth once daily for thyroid  Orders: T- * Misc. Laboratory test [phone]) TLB-CBC Platelet - w/Differential (85025-CBCD) TLB-Sedimentation Rate (ESR) (85652-ESR) TLB-TSH (Thyroid Stimulating Hormone) (84443-TSH) TLB-Udip ONLY (81003-UDIP)  Complete Medication List: 1)  Lipitor 20 Mg Tabs (Atorvastatin calcium) .... 1/2  two times a day 2)  Nasacort Aq [name] Mcg/act Aers (Triamcinolone acetonide(nasal)) .... Each nostril once daily 3)  Nitrostat 0.4 Mg Subl (Nitroglycerin) .Marland Kitchen.. 1 tablet under tongue at onset of chest pain; you may repeat every 5 minutes for up to 3 doses. 4)  Metoprolol Succinate 50 Mg Tb24 (Metoprolol succinate) .... 1/2 two times a day 5)  Osteo Bi-flex Adv Double St Tabs (Misc natural products) .... Once daily 6)  Aspir-low 81 Mg Tbec (Aspirin) .Marland Kitchen.. 1 once daily pc 7)  Vitamin D3 1000 Unit Tabs (Cholecalciferol) .Marland Kitchen.. 1 by mouth daily 8)  Aricept 10 Mg Tabs (Donepezil hydrochloride) .... Take 1 tab daily 9)  Folic Acid 800 Mcg Tabs (Folic acid) .... Take 1 by mouth once daily 10)   Levothroid 100 Mcg Tabs (Levothyroxine sodium) .Marland Kitchen.. 1 by mouth once daily for thyroid  Patient Instructions: 1)  Please schedule a follow-up appointment in 3 months. 2)  BMP prior to visit, ICD-9: 3)  Lipid Panel prior to visit, ICD-9:995.20 414.8 4)  TSH prior to visit, ICD-9: 5)  CBC w/ Diff prior to visit, ICD-9: 6)  Call if you are not better in a reasonable amount of time or if worse.  Prescriptions: LEVOTHROID 100 MCG TABS (LEVOTHYROXINE SODIUM) 1 by mouth once daily for thyroid  #30 x 12   Entered and Authorized by:   Tresa Garter MD   Signed by:   Tresa Garter MD on 04/10/2009   Method used:   Print then Give to Patient   RxID:   [id number]

## 2010-04-26 NOTE — Assessment & Plan Note (Signed)
Summary: 3 mo rov /nws  #   Vital Signs:  Patient profile:   77 year old male Height:      67 inches Weight:      150 pounds BMI:     23.58 Temp:     97.6 degrees F oral Pulse rate:   80 / minute Pulse rhythm:   regular Resp:     16 per minute BP sitting:   110 / 68  (left arm) Cuff size:   regular  Vitals Entered By: Lanier Prude, CMA(AAMA) (January 17, 2010 11:18 AM) CC: 3 mo f/u   Primary Care Provider:  Tresa Garter MD  CC:  3 mo f/u.  History of Present Illness: The patient presents for a follow up of hypertension, CAD, hyperlipidemia The patient presents for a preventive health examination  Patient past medical history, social history, and family history reviewed in detail no significant changes.  Patient is physically active. Depression is negative and mood is good. Hearing is decreasedl, and able to perform activities of daily living. Risk of falling is negligible and home safety has been reviewed and is appropriate. Patient has normal height, weight, and visual acuity w/glasses. Patient has been counseled on age-appropriate routine health concerns for screening and prevention. Education, counseling done.    Preventive Screening-Counseling & Management  Alcohol-Tobacco     Alcohol drinks/day: 0     Smoking Status: quit > 6 months     Smoking Cessation Counseling: no  Caffeine-Diet-Exercise     Caffeine Counseling: not indicated; caffeine use is not excessive or problematic     Diet Counseling: not indicated; diet is assessed to be healthy     Does Patient Exercise: yes     Depression Counseling: not indicated; screening negative for depression  Hep-HIV-STD-Contraception     Hepatitis Risk: no risk noted     Sun Exposure-Excessive: no  Safety-Violence-Falls     Seat Belt Use: yes     Firearms in the Home: firearms in the home     Fall Risk Counseling: not indicated; no significant falls noted  Current Medications (verified): 1)  Lipitor 20 Mg  Tabs  (Atorvastatin Calcium) .... 1/2 Tab By Mouth Qd 2)  Nasacort Aq 55 Mcg/act  Aers (Triamcinolone Acetonide(Nasal)) .... Each Nostril Once Daily 3)  Nitrostat 0.4 Mg Subl (Nitroglycerin) .Marland Kitchen.. 1 Tablet Under Tongue At Onset of Chest Pain; You May Repeat Every 5 Minutes For Up To 3 Doses. 4)  Metoprolol Succinate 50 Mg  Tb24 (Metoprolol Succinate) .... 1/2 Two Times A Day 5)  Osteo Bi-Flex Adv Double St   Tabs (Misc Natural Products) .... Once Daily 6)  Aspir-Low 81 Mg Tbec (Aspirin) .Marland Kitchen.. 1 Once Daily Pc 7)  Vitamin D3 1000 Unit  Tabs (Cholecalciferol) .Marland Kitchen.. 1 By Mouth Daily 8)  Aricept 10 Mg Tabs (Donepezil Hydrochloride) .... Take 1 Tab Daily 9)  Folic Acid 800 Mcg Tabs (Folic Acid) .... Take 1 By Mouth Once Daily 10)  Levoxyl 112 Mcg Tabs (Levothyroxine Sodium) .... Once Daily  Allergies (verified): No Known Drug Allergies  Past History:  Past Medical History: Last updated: 05/11/2009 Coronary artery disease Dr Ladona Ridgel Hypertension Hypothyroidism Osteoarthritis Osteoporosis (took Actonel x 5 y) Benign prostatic hypertrophy Dr Vonita Moss Renal insufficiency   Dr Briant Cedar Low back pain 2009 Hip fracture x2 on R Sternum fracture 2011  Social History: Last updated: 02/13/2007 Retired Former Smoker Alcohol use-no Regular exercise-no Married  Social History: Smoking Status:  quit > 6 months Hepatitis Risk:  no risk noted Sun Exposure-Excessive:  no Seat Belt Use:  yes  Review of Systems  The patient denies fever, weight gain, chest pain, and headaches.    Physical Exam  General:  Well-developed,well-nourished,in no acute distress; alert,appropriate and cooperative throughout examination Nose:  External nasal examination shows no deformity or inflammation. Nasal mucosa are pink and moist without lesions or exudates. Mouth:  Oral mucosa and oropharynx without lesions or exudates.  Teeth in good repair. Lungs:  Normal respiratory effort, chest expands symmetrically. Lungs are  clear to auscultation, no crackles or wheezes. Heart:  Normal rate and regular rhythm. S1 and S2 normal without gallop, murmur, click, rub or other extra sounds. Abdomen:  Bowel sounds positive,abdomen soft and non-tender without masses, organomegaly or hernias noted. Msk:  No deformity or scoliosis noted of thoracic or lumbar spine.  Using a walker. R hip is not tender Neurologic:  No cranial nerve deficits noted. Station and gait are normal.ataxic gait.   Skin:  No bruises Psych:  Cognition and judgment appear intact. Alert and cooperative with normal attention span and concentration. No apparent delusions, illusions, hallucinations   Impression & Recommendations:  Problem # 1:  HEALTH MAINTENANCE EXAM (ICD-V70.0) Assessment New  Overall doing well, age appropriate education and counseling updated and referral for appropriate preventive services done unless declined, immunizations up to date or declined, diet counseling done if overweight, urged to quit smoking if smokes, most recent labs reviewed and current ordered if appropriate, ecg reviewed or declined (interpretation per ECG scanned in the EMR if done); information regarding Medicare Preventation requirements given if appropriate.  The labs were reviewed with the patient.   Orders: Medicare -1st Annual Wellness Visit [phone])  Problem # 2:  CHRONIC SYSTOLIC HEART FAILURE (ICD-428.22)  His updated medication list for this problem includes:    Metoprolol Succinate 50 Mg Tb24 (Metoprolol succinate) .Marland Kitchen... 1/2 two times a day    Aspir-low 81 Mg Tbec (Aspirin) .Marland Kitchen... 1 once daily pc  Problem # 3:  MEMORY LOSS (ICD-780.93) Assessment: Unchanged On the regimen of medicine(s) reflected in the chart    Problem # 4:  CORONARY ARTERY DISEASE (ICD-414.00)  His updated medication list for this problem includes:    Nitrostat 0.4 Mg Subl (Nitroglycerin) .Marland Kitchen... 1 tablet under tongue at onset of chest pain; you may repeat every 5 minutes for up  to 3 doses.    Metoprolol Succinate 50 Mg Tb24 (Metoprolol succinate) .Marland Kitchen... 1/2 two times a day    Aspir-low 81 Mg Tbec (Aspirin) .Marland Kitchen... 1 once daily pc  Problem # 5:  RENAL INSUFFICIENCY (ICD-588.9) Assessment: Unchanged  Problem # 6:  ATAXIA (ICD-781.3) multifactorial Assessment: Unchanged See "Patient Instructions".   Complete Medication List: 1)  Lipitor 20 Mg Tabs (Atorvastatin calcium) .... 1/2 tab by mouth two times a day 2)  Nasacort Aq 55 Mcg/act Aers (Triamcinolone acetonide(nasal)) .... Each nostril once daily 3)  Nitrostat 0.4 Mg Subl (Nitroglycerin) .Marland Kitchen.. 1 tablet under tongue at onset of chest pain; you may repeat every 5 minutes for up to 3 doses. 4)  Metoprolol Succinate 50 Mg Tb24 (Metoprolol succinate) .... 1/2 two times a day 5)  Osteo Bi-flex Adv Double St Tabs (Misc natural products) .... Once daily 6)  Aspir-low 81 Mg Tbec (Aspirin) .Marland Kitchen.. 1 once daily pc 7)  Vitamin D3 1000 Unit Tabs (Cholecalciferol) .Marland Kitchen.. 1 by mouth daily 8)  Aricept 10 Mg Tabs (Donepezil hydrochloride) .... Take 1 tab daily 9)  Folic Acid 800 Mcg Tabs (Folic acid) .Marland KitchenMarland KitchenMarland Kitchen  Take 1 by mouth once daily 10)  Levoxyl 100 Mcg Tabs (Levothyroxine sodium) .Marland Kitchen.. 1 once daily  Other Orders: Tdap => 61yrs IM (21308) Admin 1st Vaccine (65784)  Patient Instructions: 1)  Please schedule a follow-up appointment in 4 months. 2)  BMP prior to visit, ICD-9: 3)  TSH prior to visit, ICD-9: 4)  Hepatic Panel prior to visit, ICD-9:244.8 995.20 5)  Use balance exercises that I have provided (5 min. or longer every day)  Prescriptions: LIPITOR 20 MG  TABS (ATORVASTATIN CALCIUM) 1/2 tab by mouth two times a day  #90 x 3   Entered and Authorized by:   Tresa Garter MD   Signed by:   Tresa Garter MD on 01/17/2010   Method used:   Electronically to        [facility]* (retail)             ,          Ph: [phone]       Fax: [phone]   RxID:   [id number]    Orders Added: 1)  Tdap => 17yrs  IM [38756] 2)  Admin 1st Vaccine [90471] 3)  Medicare -1st Annual Wellness Visit [G0438] 4)  Est. Patient Level IV [43329]   Immunization History:  Pneumovax Immunization History:    Pneumovax:  historical (08/31/2003)  Immunizations Administered:  Tetanus Vaccine:    Vaccine Type: Tdap    Site: right deltoid    Dose: 0.5 ml    Route: IM    Given by: Lanier Prude, CMA(AAMA)    Exp. Date: 01/12/2012    Lot #: [id number]    VIS given: 02/10/08 version given January 17, 2010.   Immunization History:  Pneumovax Immunization History:    Pneumovax:  Historical (08/31/2003)  Immunizations Administered:  Tetanus Vaccine:    Vaccine Type: Tdap    Site: right deltoid    Dose: 0.5 ml    Route: IM    Given by: Lanier Prude, CMA(AAMA)    Exp. Date: 01/12/2012    Lot #: [id number]    VIS given: 02/10/08 version given January 17, 2010.  Influenza Vaccine (to be given today)

## 2010-04-26 NOTE — Assessment & Plan Note (Signed)
Summary: pc2 medtronic  *change name in programmer*   Visit Type:  Follow-up Primary Provider:  Tresa Garter MD   History of Present Illness: [name] [name] returns today for followup.  He is a pleasant 77 yo man with an ICM, CHF, HTN, and mild dementia.  In the interim, he has improved after his prior falls and hip fractures.  He denies c/p, sob, or peripheral edema. He is s/p ICD generator change. He denies any intercurrent ICD therapies.  Current Medications (verified): 1)  Lipitor 20 Mg  Tabs (Atorvastatin Calcium) .... 1/2 Tab By Mouth Qd 2)  Nasacort Aq 55 Mcg/act  Aers (Triamcinolone Acetonide(Nasal)) .... Each Nostril Once Daily 3)  Nitrostat 0.4 Mg Subl (Nitroglycerin) .Marland Kitchen.. 1 Tablet Under Tongue At Onset of Chest Pain; You May Repeat Every 5 Minutes For Up To 3 Doses. 4)  Metoprolol Succinate 50 Mg  Tb24 (Metoprolol Succinate) .... 1/2 Two Times A Day 5)  Osteo Bi-Flex Adv Double St   Tabs (Misc Natural Products) .... Once Daily 6)  Aspir-Low 81 Mg Tbec (Aspirin) .Marland Kitchen.. 1 Once Daily Pc 7)  Vitamin D3 1000 Unit  Tabs (Cholecalciferol) .Marland Kitchen.. 1 By Mouth Daily 8)  Aricept 10 Mg Tabs (Donepezil Hydrochloride) .... Take 1 Tab Daily 9)  Folic Acid 800 Mcg Tabs (Folic Acid) .... Take 1 By Mouth Once Daily 10)  Levoxyl 112 Mcg Tabs (Levothyroxine Sodium) .... Once Daily  Allergies (verified): No Known Drug Allergies  Past History:  Past Medical History: Last updated: 05/11/2009 Coronary artery disease Dr Ladona Ridgel Hypertension Hypothyroidism Osteoarthritis Osteoporosis (took Actonel x 5 y) Benign prostatic hypertrophy Dr Vonita Moss Renal insufficiency   Dr Briant Cedar Low back pain 2009 Hip fracture x2 on R Sternum fracture 2011  Past Surgical History: Last updated: 11/25/2008 Permanent pacemaker Appendectomy Inguinal herniorrhaphy Hip fracture ORIF 2010 R  Review of Systems  The patient denies chest pain, syncope, dyspnea on exertion, and peripheral edema.    Vital  Signs:  Patient profile:   77 year old male Height:      67 inches Weight:      149 pounds BMI:     23.42 Pulse rate:   67 / minute BP sitting:   110 / 60  (left arm)  Vitals Entered By: Laurance Flatten CMA (December 19, 2009 2:21 PM)  Physical Exam  General:  Well-developed,well-nourished,in no acute distress; alert,appropriate and cooperative throughout examination Head:  Normocephalic and atraumatic without obvious abnormalities. No apparent alopecia or balding. Eyes:  No corneal or conjunctival inflammation noted. EOMI. Perrla.  Mouth:  Oral mucosa and oropharynx without lesions or exudates.  Teeth in good repair. Neck:  No deformities, masses, or tenderness noted. Chest Wall:  less tender sernum, no deformitie or crepitus. Well healed ICD incision. Lungs:  Normal respiratory effort, chest expands symmetrically. Lungs are clear to auscultation, no crackles or wheezes. Heart:  Normal rate and regular rhythm. S1 and S2 normal without gallop, murmur, click, rub or other extra sounds. Abdomen:  Bowel sounds positive,abdomen soft and non-tender without masses, organomegaly or hernias noted. Msk:  No deformity or scoliosis noted of thoracic or lumbar spine.  Using a walker. R hip is not tender Pulses:  R and L carotid,radial,femoral,dorsalis pedis and posterior tibial pulses are full and equal bilaterally Extremities:  No clubbing, cyanosis, edema, or deformity noted with normal full range of motion of all joints.   Neurologic:  No cranial nerve deficits noted. Station and gait are normal.    ICD Specifications Following MD:  [name] Bunting, MD     ICD Vendor:  Medtronic     ICD Model Number:  [id number]     ICD Serial Number:  [id number] ICD DOI:  08/24/2009     ICD Implanting MD:  [name] Bunting, MD  Lead 1:    Location: RA     DOI: 02/25/2002     Model #: [id number]     Serial #: [id number]     Status: active Lead 2:    Location: RV     DOI: 02/25/2002     Model #: [id number]     Serial #: [id number]      Status: active  Indications::  ICM   ICD Follow Up Battery Voltage:  3.19 V     Charge Time:  8.6 seconds     Underlying rhythm:  SR   ICD Device Measurements Atrium:  Amplitude: 1.9 mV, Impedance: 532 ohms, Threshold: 0.75 V at 0.40 msec Right Ventricle:  Amplitude: 11.3 mV, Impedance: 418 ohms, Threshold: 1.25 V at 0.40 msec Shock Impedance: 41/61 ohms   Episodes MS Episodes:  0     Coumadin:  No Shock:  0     ATP:  0     Nonsustained:  0     Atrial Therapies:  0 Atrial Pacing:  98.8%     Ventricular Pacing:  <0.1%  Brady Parameters Mode MVP     Lower Rate Limit:  60     Upper Rate Limit 130 PAV 180     Sensed AV Delay:  150  Tachy Zones VF:  200     VT:  158     VT1:  158     Next Remote Date:  03/22/2010     Next Cardiology Appt Due:  11/26/2010 Tech Comments:  NORMAL DEVICE FUNCTION.  NO EPISODES SINCE LAST CHECK.  OPTIVOL STABLE.  NO CHANGES MADE. CARELINK TRANSMISSION 03-22-10. ROV IN 12 MTHS. Vella Kohler  December 19, 2009 2:37 PM MD Comments:  Agree with above.  Impression & Recommendations:  Problem # 1:  AUTOMATIC IMPLANTABLE CARDIAC DEFIBRILLATOR SITU (ICD-V45.02) His device is working normally.  Will recheck in several months.  Problem # 2:  CHRONIC SYSTOLIC HEART FAILURE (ICD-428.22) He will continue his current meds and maintain a low sodium diet. His updated medication list for this problem includes:    Nitrostat 0.4 Mg Subl (Nitroglycerin) .Marland Kitchen... 1 tablet under tongue at onset of chest pain; you may repeat every 5 minutes for up to 3 doses.    Metoprolol Succinate 50 Mg Tb24 (Metoprolol succinate) .Marland Kitchen... 1/2 two times a day    Aspir-low 81 Mg Tbec (Aspirin) .Marland Kitchen... 1 once daily pc  Problem # 3:  CORONARY ARTERY DISEASE (ICD-414.00) No anginal symptoms.  Continue meds as below.  He is fairly sedentary. His updated medication list for this problem includes:    Nitrostat 0.4 Mg Subl (Nitroglycerin) .Marland Kitchen... 1 tablet under tongue at onset of chest pain; you may  repeat every 5 minutes for up to 3 doses.    Metoprolol Succinate 50 Mg Tb24 (Metoprolol succinate) .Marland Kitchen... 1/2 two times a day    Aspir-low 81 Mg Tbec (Aspirin) .Marland Kitchen... 1 once daily pc  Patient Instructions: 1)  Your physician wants you to follow-up in:  12 months with DrTaylor  You will receive a reminder letter in the mail two months in advance. If you don't receive a letter, please call our office to schedule the follow-up appointment. 2)  [facility] transmission on 03/22/10 Prescriptions:  METOPROLOL SUCCINATE 50 MG  TB24 (METOPROLOL SUCCINATE) 1/2 two times a day  #90 Tablet x 3   Entered by:   Dennis Bast, RN, BSN   Authorized by:   Laren Boom, MD, [facility]   Signed by:   Dennis Bast, RN, BSN on 12/19/2009   Method used:   Electronically to        [facility]* (retail)             ,          Ph: [phone]       Fax: [phone]   RxID:   [id number] LIPITOR 20 MG  TABS (ATORVASTATIN CALCIUM) 1/2 tab by mouth qd  #90 x 3   Entered by:   Dennis Bast, RN, BSN   Authorized by:   Laren Boom, MD, [facility]   Signed by:   Dennis Bast, RN, BSN on 12/19/2009   Method used:   Electronically to        [facility]* (retail)             ,          Ph: [phone]       Fax: [phone]   RxID:   [id number]

## 2010-04-26 NOTE — Cardiovascular Report (Signed)
Summary: Office Visit Remote   Office Visit Remote   Imported By: Roderic Ovens 05/01/2009 16:31:44  _____________________________________________________________________  External Attachment:    Type:   Image     Comment:   External Document

## 2010-04-26 NOTE — Cardiovascular Report (Signed)
Summary: Office Visit   Office Visit   Imported By: Roderic Ovens 09/21/2009 12:42:45  _____________________________________________________________________  External Attachment:    Type:   Image     Comment:   External Document

## 2010-04-26 NOTE — Progress Notes (Signed)
Summary: [facility] dosage question  Phone Note From Pharmacy Message from:  Patient on December 28, 2009 12:14 PM  Refills Requested: Medication #1:  LIPITOR 20 MG  TABS 1/2 tab by mouth qd verfiy dosage [phone]  ref #[id number]  Caller: [facility]* Summary of Call: [facility] has questions re dosage [phone]  WNU#[id number] Initial call taken by: Glynda Jaeger,  December 28, 2009 12:16 PM  Follow-up for Phone Call        Pharmacy calling backm regarding medication Lipitor Judie Grieve  December 29, 2009 1:25 PM  Pharmacy calling back regarding the medication Lipitor Judie Grieve  January 04, 2010 9:21 AM

## 2010-04-26 NOTE — Cardiovascular Report (Signed)
Summary: Pre Op Orders  Pre Op Orders   Imported By: Roderic Ovens 08/17/2009 12:40:02  _____________________________________________________________________  External Attachment:    Type:   Image     Comment:   External Document

## 2010-04-26 NOTE — Cardiovascular Report (Signed)
Summary: Office Visit Remote   Office Visit Remote   Imported By: Roderic Ovens 04/13/2010 10:53:14  _____________________________________________________________________  External Attachment:    Type:   Image     Comment:   External Document

## 2010-04-26 NOTE — Letter (Signed)
Summary: Remote Device Check  [employer], Main Office  [address]   [place], [place] [address]   Phone: [phone]  Fax: [phone]     April 26, 2009 MRN: [id number]   [name] [name] [address] [place], [place]  [address]   Dear [name],   Your remote transmission was recieved and reviewed by your physician.  All diagnostics were within normal limits for you.  __X___Your next transmission is scheduled for:    July 18, 2009.  Please transmit at any time this day.  If you have a wireless device your transmission will be sent automatically.      Sincerely,  Proofreader

## 2010-04-26 NOTE — Letter (Signed)
Summary: Device-Delinquent Phone Journalist, newspaper, Main Office  [address]   [place], [place] [address]   Phone: [phone]  Fax: [phone]     April 07, 2009 MRN: [id number]   [name] [name] [address] [place], [place]  [address]   Dear Mr. POYSER,  According to our records, you were scheduled for a device phone transmission on  April 04, 2009.     We did not receive any results from this check.  If you transmitted on your scheduled day, please call us to help troubleshoot your system.  If you forgot to send your transmission, please send one upon receipt of this letter.  Thank you,   Architectural technologist [facility]

## 2010-04-26 NOTE — Progress Notes (Signed)
Summary: RESULTS  Phone Note Call from Patient Call back at Home Phone [phone]   Summary of Call: Patient is requesting results of TSH. Should he stay on same dose of medication? If yes, pt needs new rx.  Initial call taken by: Lamar Sprinkles, CMA,  November 30, 2009 10:01 AM  Follow-up for Phone Call        Same dose: ref x 12 months  Follow-up by: Tresa Garter MD,  November 30, 2009 12:42 PM  Additional Follow-up for Phone Call Additional follow up Details #1::        Left detailed vm on pt's home #  Additional Follow-up by: Lamar Sprinkles, CMA,  November 30, 2009 4:11 PM    Prescriptions: LEVOXYL 100 MCG TABS (LEVOTHYROXINE SODIUM) 1 by mouth qd  #90 x 3   Entered by:   Lamar Sprinkles, CMA   Authorized by:   Tresa Garter MD   Signed by:   Lamar Sprinkles, CMA on 11/30/2009   Method used:   Electronically to        [facility]* (retail)             ,          Ph: [phone]       Fax: [phone]   RxID:   [id number]

## 2010-04-26 NOTE — Cardiovascular Report (Signed)
Summary: Pre Op Orders   Pre Op Orders   Imported By: Roderic Ovens 08/30/2009 15:31:16  _____________________________________________________________________  External Attachment:    Type:   Image     Comment:   External Document

## 2010-04-26 NOTE — Assessment & Plan Note (Signed)
Summary: defib [email or web address]   History of Present Illness: Mr. Nylund returns today for followup.  He is a pleasant 77 yo man with an ICM, CHF, HTN, and mild dementia.  In the interim, he has improved after his prior falls and hip fractures.  He denies c/p, sob, or peripheral edema. He has reached ERI.  Current Medications (verified): 1)  Lipitor 20 Mg  Tabs (Atorvastatin Calcium) .... 1/2  Two Times A Day 2)  Nasacort Aq 55 Mcg/act  Aers (Triamcinolone Acetonide(Nasal)) .... Each Nostril Once Daily 3)  Nitrostat 0.4 Mg Subl (Nitroglycerin) .Marland Kitchen.. 1 Tablet Under Tongue At Onset of Chest Pain; You May Repeat Every 5 Minutes For Up To 3 Doses. 4)  Metoprolol Succinate 50 Mg  Tb24 (Metoprolol Succinate) .... 1/2 Two Times A Day 5)  Osteo Bi-Flex Adv Double St   Tabs (Misc Natural Products) .... Once Daily 6)  Aspir-Low 81 Mg Tbec (Aspirin) .Marland Kitchen.. 1 Once Daily Pc 7)  Vitamin D3 1000 Unit  Tabs (Cholecalciferol) .Marland Kitchen.. 1 By Mouth Daily 8)  Aricept 10 Mg Tabs (Donepezil Hydrochloride) .... Take 1 Tab Daily 9)  Folic Acid 800 Mcg Tabs (Folic Acid) .... Take 1 By Mouth Once Daily 10)  Levoxyl 75 Mcg Tabs (Levothyroxine Sodium) .... Take One Tablet By Mouth Once Daily. 11)  Nasacort Aq 55 Mcg/act Aers (Triamcinolone Acetonide(Nasal)) .... Uad  Allergies (verified): No Known Drug Allergies  Past History:  Past Medical History: Last updated: 05/11/2009 Coronary artery disease Dr Ladona Ridgel Hypertension Hypothyroidism Osteoarthritis Osteoporosis (took Actonel x 5 y) Benign prostatic hypertrophy Dr Vonita Moss Renal insufficiency   Dr Briant Cedar Low back pain 2009 Hip fracture x2 on R Sternum fracture 2011  Past Surgical History: Last updated: 11/25/2008 Permanent pacemaker Appendectomy Inguinal herniorrhaphy Hip fracture ORIF 2010 R  Review of Systems  The patient denies chest pain, syncope, dyspnea on exertion, and peripheral edema.    Vital Signs:  Patient profile:   77 year old  male Height:      67 inches Weight:      149 pounds BMI:     23.42 Pulse rate:   50 / minute BP sitting:   130 / 70  (left arm)  Vitals Entered By: Laurance Flatten CMA (Aug 01, 2009 3:43 PM)  Physical Exam  General:  Well-developed,well-nourished,in no acute distress; alert,appropriate and cooperative throughout examination Head:  Normocephalic and atraumatic without obvious abnormalities. No apparent alopecia or balding. Eyes:  No corneal or conjunctival inflammation noted. EOMI. Perrla.  Mouth:  Oral mucosa and oropharynx without lesions or exudates.  Teeth in good repair. Neck:  No deformities, masses, or tenderness noted. Chest Wall:  less tender sernum, no deformitie or crepitus. Well healed ICD incision. Lungs:  Normal respiratory effort, chest expands symmetrically. Lungs are clear to auscultation, no crackles or wheezes. Heart:  Normal rate and regular rhythm. S1 and S2 normal without gallop, murmur, click, rub or other extra sounds. Abdomen:  Bowel sounds positive,abdomen soft and non-tender without masses, organomegaly or hernias noted. Msk:  No deformity or scoliosis noted of thoracic or lumbar spine.  Pulses:  R and L carotid,radial,femoral,dorsalis pedis and posterior tibial pulses are full and equal bilaterally Extremities:  No clubbing, cyanosis, edema, or deformity noted with normal full range of motion of all joints.   Neurologic:  No cranial nerve deficits noted. Station and gait are normal.    ICD Specifications Following MD:  Lewayne Bunting, MD     ICD Vendor:  Medtronic     ICD  Model Number:  7278     ICD Serial Number:  [id number] ICD DOI:  02/25/2002     ICD Implanting MD:  Lewayne Bunting, MD  Lead 1:    Location: RA     DOI: 02/25/2002     Model #: 0981     Serial #: [id number]     Status: active Lead 2:    Location: RV     DOI: 02/25/2002     Model #: 6213     Serial #: [id number]     Status: active  Indications::  ICM   ICD Follow Up Battery Voltage:  2.62 V      Charge Time:  11.19 seconds     Underlying rhythm:  SR   ICD Device Measurements Atrium:  Amplitude: 2.6 mV, Impedance: 624 ohms, Threshold: 1.0 V at 0.2 msec Right Ventricle:  Amplitude: 9.0 mV, Impedance: 448 ohms, Threshold: 1.0 V at 0.03 msec Shock Impedance: 44/65 ohms   Episodes MS Episodes:  0     Percent Mode Switch:  0     Coumadin:  No Shock:  0     ATP:  0     Nonsustained:  0     Atrial Therapies:  0 Atrial Pacing:  27.5%     Ventricular Pacing:  0.3%  Brady Parameters Mode DDD     Lower Rate Limit:  50     Upper Rate Limit 120 PAV 300     Sensed AV Delay:  300  Tachy Zones VF:  200     VT:  250     VT1:  158     Tech Comments:  BATTERY AT ERI.  NORMAL DEVICE FUNCTION.  NO CHANGES MADE.  Vella Kohler  Aug 01, 2009 3:52 PM MD Comments:  Agree with above.  Impression & Recommendations:  Problem # 1:  AUTOMATIC IMPLANTABLE CARDIAC DEFIBRILLATOR SITU (ICD-V45.02) His device has reached ERI. Review of the electrograms reveals that he is using backup pacing.  I discussed the options which include proceeding with ICD generator change or changing to a PPM.  I have discussed the issues of each and will plan to proceed with new ICD insertion and generator change.  Problem # 2:  CHRONIC SYSTOLIC HEART FAILURE (ICD-428.22) His symptoms remain class 2.  Will followup after ICD generator change. His updated medication list for this problem includes:    Nitrostat 0.4 Mg Subl (Nitroglycerin) .Marland Kitchen... 1 tablet under tongue at onset of chest pain; you may repeat every 5 minutes for up to 3 doses.    Metoprolol Succinate 50 Mg Tb24 (Metoprolol succinate) .Marland Kitchen... 1/2 two times a day    Aspir-low 81 Mg Tbec (Aspirin) .Marland Kitchen... 1 once daily pc  Patient Instructions: 1)  Your physician recommends that you continue on your current medications as directed. Please refer to the Current Medication list given to you today.

## 2010-04-26 NOTE — Miscellaneous (Signed)
Summary: Registration Update  Registration Update   Imported By: Elenor Legato 08/01/2009 15:24:16  _____________________________________________________________________  External Attachment:    Type:   Image     Comment:   External Document

## 2010-04-26 NOTE — Procedures (Signed)
Summary: WOUND CHEKC   Current Medications (verified): 1)  Lipitor 20 Mg  Tabs (Atorvastatin Calcium) .... 1/2 Tab By Mouth Qd 2)  Nasacort Aq 55 Mcg/act  Aers (Triamcinolone Acetonide(Nasal)) .... Each Nostril Once Daily 3)  Nitrostat 0.4 Mg Subl (Nitroglycerin) .Marland Kitchen.. 1 Tablet Under Tongue At Onset of Chest Pain; You May Repeat Every 5 Minutes For Up To 3 Doses. 4)  Metoprolol Succinate 50 Mg  Tb24 (Metoprolol Succinate) .... 1/2 Two Times A Day 5)  Osteo Bi-Flex Adv Double St   Tabs (Misc Natural Products) .... Once Daily 6)  Aspir-Low 81 Mg Tbec (Aspirin) .Marland Kitchen.. 1 Once Daily Pc 7)  Vitamin D3 1000 Unit  Tabs (Cholecalciferol) .Marland Kitchen.. 1 By Mouth Daily 8)  Aricept 10 Mg Tabs (Donepezil Hydrochloride) .... Take 1 Tab Daily 9)  Folic Acid 800 Mcg Tabs (Folic Acid) .... Take 1 By Mouth Once Daily 10)  Levoxyl 112 Mcg Tabs (Levothyroxine Sodium) .Marland Kitchen.. 1 By Mouth Qd  Allergies (verified): No Known Drug Allergies   ICD Specifications Following MD:  Lewayne Bunting, MD     ICD Vendor:  Medtronic     ICD Model Number:  7278     ICD Serial Number:  [id number] ICD DOI:  02/25/2002     ICD Implanting MD:  Lewayne Bunting, MD  Lead 1:    Location: RA     DOI: 02/25/2002     Model #: 0981     Serial #: [id number]     Status: active Lead 2:    Location: RV     DOI: 02/25/2002     Model #: 6213     Serial #: [id number]     Status: active  Indications::  ICM   ICD Follow Up Battery Voltage:  3.21 V     Charge Time:  8.6 seconds     Underlying rhythm:  SR   ICD Device Measurements Atrium:  Impedance: 551 ohms,  Right Ventricle:  Impedance: 418 ohms,  Shock Impedance: 42/60 ohms   Episodes MS Episodes:  0     Percent Mode Switch:  0     Coumadin:  No Shock:  0     ATP:  0     Nonsustained:  0     Atrial Therapies:  0 Atrial Pacing:  92%     Ventricular Pacing:  <0.1%  Brady Parameters Mode DDDR     Lower Rate Limit:  60     Upper Rate Limit 130 PAV 180     Sensed AV Delay:  150  Tachy Zones VF:   200     VT:  250     VT1:  158     Tech Comments:  WOUND CHECK--STERI STRIPS REMOVED.  WOUND WITHOUT REDNESS OR EDEMA.  NORMAL DEVICE FUNCTION.  HISTOGRAM FLAT AND WITH PT AP 92% TURNED ON RATE RESPONSE. PT IS SOMEWHAT ACTIVE AND DOES GET TIRED EASILY.  AT CHANGE OUT PT'S HR CHANGED FROM 50 TO 60. CHANGED DEVICE INFORMATION IN [facility] AND NEW TRANSMITTER ORDERED FOR PT.  PT AWARE TO SEND OLD TRANSMITTER BACK.  PT AWARE TO SEND TRANSMISSION ONCE RECEIVES NEW TRANSMITTER.  ROV IN 3 MTHS W/GT. Vella Kohler  September 07, 2009 11:07 AM

## 2010-05-02 ENCOUNTER — Other Ambulatory Visit: Payer: Self-pay

## 2010-05-11 ENCOUNTER — Other Ambulatory Visit: Payer: Medicare Other

## 2010-05-11 ENCOUNTER — Encounter (INDEPENDENT_AMBULATORY_CARE_PROVIDER_SITE_OTHER): Payer: Self-pay | Admitting: *Deleted

## 2010-05-11 ENCOUNTER — Other Ambulatory Visit: Payer: Self-pay | Admitting: Internal Medicine

## 2010-05-11 DIAGNOSIS — E038 Other specified hypothyroidism: Secondary | ICD-10-CM

## 2010-05-11 DIAGNOSIS — T887XXA Unspecified adverse effect of drug or medicament, initial encounter: Secondary | ICD-10-CM

## 2010-05-11 LAB — BASIC METABOLIC PANEL
CO2: 31 mEq/L (ref 19–32)
Chloride: 102 mEq/L (ref 96–112)
Creatinine, Ser: 1.8 mg/dL — ABNORMAL HIGH (ref 0.4–1.5)
Sodium: 138 mEq/L (ref 135–145)

## 2010-05-11 LAB — HEPATIC FUNCTION PANEL
ALT: 34 U/L (ref 0–53)
AST: 33 U/L (ref 0–37)
Bilirubin, Direct: 0.2 mg/dL (ref 0.0–0.3)
Total Bilirubin: 1 mg/dL (ref 0.3–1.2)

## 2010-05-17 ENCOUNTER — Ambulatory Visit (INDEPENDENT_AMBULATORY_CARE_PROVIDER_SITE_OTHER): Payer: Medicare Other | Admitting: Internal Medicine

## 2010-05-17 ENCOUNTER — Encounter: Payer: Self-pay | Admitting: Internal Medicine

## 2010-05-17 DIAGNOSIS — I10 Essential (primary) hypertension: Secondary | ICD-10-CM

## 2010-05-17 DIAGNOSIS — I2510 Atherosclerotic heart disease of native coronary artery without angina pectoris: Secondary | ICD-10-CM

## 2010-05-17 DIAGNOSIS — L570 Actinic keratosis: Secondary | ICD-10-CM

## 2010-05-17 DIAGNOSIS — N259 Disorder resulting from impaired renal tubular function, unspecified: Secondary | ICD-10-CM

## 2010-05-22 NOTE — Assessment & Plan Note (Signed)
Summary: 4 MO FU-# CD   Vital Signs:  Patient profile:   77 year old male Height:      67 inches Weight:      150 pounds BMI:     23.58 Temp:     97.8 degrees F oral Pulse rate:   92 / minute Pulse rhythm:   regular Resp:     16 per minute BP sitting:   120 / 70  (left arm) Cuff size:   regular  Vitals Entered By: Lanier Prude, CMA(AAMA) (May 17, 2010 11:04 AM) CC: 4 mo f/u  Is Patient Diabetic? No   Primary Care Provider:  Tresa Garter MD  CC:  4 mo f/u .  History of Present Illness: The patient presents for a follow up of hypertension, CRF, hyperlipidemia   Current Medications (verified): 1)  Lipitor 20 Mg  Tabs (Atorvastatin Calcium) .... 1/2 Tab By Mouth Two Times A Day 2)  Nasacort Aq 55 Mcg/act  Aers (Triamcinolone Acetonide(Nasal)) .... Each Nostril Once Daily 3)  Nitrostat 0.4 Mg Subl (Nitroglycerin) .Marland Kitchen.. 1 Tablet Under Tongue At Onset of Chest Pain; You May Repeat Every 5 Minutes For Up To 3 Doses. 4)  Metoprolol Succinate 50 Mg  Tb24 (Metoprolol Succinate) .... 1/2 Two Times A Day 5)  Osteo Bi-Flex Adv Double St   Tabs (Misc Natural Products) .... Once Daily 6)  Aspir-Low 81 Mg Tbec (Aspirin) .Marland Kitchen.. 1 Once Daily Pc 7)  Vitamin D3 1000 Unit  Tabs (Cholecalciferol) .Marland Kitchen.. 1 By Mouth Daily 8)  Aricept 10 Mg Tabs (Donepezil Hydrochloride) .... Take 1 Tab Daily 9)  Folic Acid 800 Mcg Tabs (Folic Acid) .... Take 1 By Mouth Once Daily 10)  Levoxyl 100 Mcg Tabs (Levothyroxine Sodium) .Marland Kitchen.. 1 Once Daily  Allergies (verified): No Known Drug Allergies  Past History:  Social History: Last updated: 02/13/2007 Retired Former Smoker Alcohol use-no Regular exercise-no Married  Past Medical History: Coronary artery disease Dr Ladona Ridgel Hypertension Hypothyroidism Osteoarthritis Osteoporosis (took Actonel x 5 y) Benign prostatic hypertrophy Dr Vonita Moss Renal insufficiency   Dr Briant Cedar Low back pain 2009 Hip fracture x2 on R Sternum fracture 2011 Elev  Alk P 2011  Physical Exam  General:  Well-developed,well-nourished,in no acute distress; alert,appropriate and cooperative throughout examination Nose:  External nasal examination shows no deformity or inflammation. Nasal mucosa are pink and moist without lesions or exudates. Mouth:  Oral mucosa and oropharynx without lesions or exudates.  Teeth in good repair. Lungs:  Normal respiratory effort, chest expands symmetrically. Lungs are clear to auscultation, no crackles or wheezes. Heart:  Normal rate and regular rhythm. S1 and S2 normal without gallop, murmur, click, rub or other extra sounds. Abdomen:  Bowel sounds positive,abdomen soft and non-tender without masses, organomegaly or hernias noted. Msk:  No deformity or scoliosis noted of thoracic or lumbar spine.  Using a walker. R hip is not tender Neurologic:  No cranial nerve deficits noted. Station and gait are normal.ataxic gait.   Skin:  AKs on face Psych:  Cognition and judgment appear intact. Alert and cooperative with normal attention span and concentration. No apparent delusions, illusions, hallucinations   Impression & Recommendations:  Problem # 1:  RENAL INSUFFICIENCY (ICD-588.9) Assessment Unchanged The labs were reviewed with the patient. See "Patient Instructions".   Problem # 2:  Elev ALK-P poss due to meds Assessment: Unchanged Will recheck [place] if not better  Problem # 3:  ACTINIC KERATOSIS (ICD-702.0) face Assessment: New He wants to see his grdtr - a  Derm PA  Problem # 4:  CORONARY ARTERY DISEASE (ICD-414.00) Assessment: Unchanged  His updated medication list for this problem includes:    Nitrostat 0.4 Mg Subl (Nitroglycerin) .Marland Kitchen... 1 tablet under tongue at onset of chest pain; you may repeat every 5 minutes for up to 3 doses.    Metoprolol Succinate 50 Mg Tb24 (Metoprolol succinate) .Marland Kitchen... 1/2 two times a day    Aspir-low 81 Mg Tbec (Aspirin) .Marland Kitchen... 1 once daily pc  Problem # 6:  HYPERTENSION  (ICD-401.9) Assessment: Unchanged  His updated medication list for this problem includes:    Metoprolol Succinate 50 Mg Tb24 (Metoprolol succinate) .Marland Kitchen... 1/2 two times a day  Complete Medication List: 1)  Lipitor 20 Mg Tabs (Atorvastatin calcium) .... 1/2 tab by mouth two times a day 2)  Nasacort Aq 55 Mcg/act Aers (Triamcinolone acetonide(nasal)) .... Each nostril once daily 3)  Nitrostat 0.4 Mg Subl (Nitroglycerin) .Marland Kitchen.. 1 tablet under tongue at onset of chest pain; you may repeat every 5 minutes for up to 3 doses. 4)  Metoprolol Succinate 50 Mg Tb24 (Metoprolol succinate) .... 1/2 two times a day 5)  Osteo Bi-flex Adv Double St Tabs (Misc natural products) .... Once daily 6)  Aspir-low 81 Mg Tbec (Aspirin) .Marland Kitchen.. 1 once daily pc 7)  Vitamin D3 1000 Unit Tabs (Cholecalciferol) .Marland Kitchen.. 1 by mouth daily 8)  Aricept 10 Mg Tabs (Donepezil hydrochloride) .... Take 1 tab daily 9)  Folic Acid 800 Mcg Tabs (Folic acid) .... Take 1 by mouth once daily 10)  Levoxyl 100 Mcg Tabs (Levothyroxine sodium) .Marland Kitchen.. 1 once daily  Patient Instructions: 1)  Please schedule a follow-up appointment in 4 months. 2)  BMP prior to visit, ICD-9: 3)  Hepatic Panel prior to visit, ICD-9:401.1  790.50 4)  CBC w/ Diff prior to visit, ICD-9: 5)  TSH prior to visit, ICD-9:   Orders Added: 1)  Est. Patient Level IV [04540]

## 2010-06-11 LAB — SURGICAL PCR SCREEN
MRSA, PCR: NEGATIVE
Staphylococcus aureus: NEGATIVE

## 2010-06-21 ENCOUNTER — Encounter (INDEPENDENT_AMBULATORY_CARE_PROVIDER_SITE_OTHER): Payer: Medicare Other | Admitting: *Deleted

## 2010-06-21 DIAGNOSIS — R0989 Other specified symptoms and signs involving the circulatory and respiratory systems: Secondary | ICD-10-CM

## 2010-06-24 ENCOUNTER — Encounter: Payer: Self-pay | Admitting: *Deleted

## 2010-07-01 LAB — BASIC METABOLIC PANEL
BUN: 27 mg/dL — ABNORMAL HIGH (ref 6–23)
GFR calc non Af Amer: 38 mL/min — ABNORMAL LOW (ref >60)
Potassium: 4.3 mEq/L (ref 3.5–5.1)

## 2010-07-04 LAB — BASIC METABOLIC PANEL WITH GFR
BUN: 30 mg/dL — ABNORMAL HIGH (ref 6–23)
BUN: 34 mg/dL — ABNORMAL HIGH (ref 6–23)
CO2: 24 meq/L (ref 19–32)
CO2: 24 meq/L (ref 19–32)
Calcium: 7.1 mg/dL — ABNORMAL LOW (ref 8.4–10.5)
Calcium: 7.2 mg/dL — ABNORMAL LOW (ref 8.4–10.5)
Chloride: 100 meq/L (ref 96–112)
Chloride: 101 meq/L (ref 96–112)
Creatinine, Ser: 1.64 mg/dL — ABNORMAL HIGH (ref 0.4–1.5)
Creatinine, Ser: 1.9 mg/dL — ABNORMAL HIGH (ref 0.4–1.5)
GFR calc non Af Amer: 34 mL/min — ABNORMAL LOW
GFR calc non Af Amer: 40 mL/min — ABNORMAL LOW
Glucose, Bld: 108 mg/dL — ABNORMAL HIGH (ref 70–99)
Glucose, Bld: 130 mg/dL — ABNORMAL HIGH (ref 70–99)
Potassium: 3.9 meq/L (ref 3.5–5.1)
Potassium: 4.1 meq/L (ref 3.5–5.1)
Sodium: 129 meq/L — ABNORMAL LOW (ref 135–145)
Sodium: 129 meq/L — ABNORMAL LOW (ref 135–145)

## 2010-07-04 LAB — DIFFERENTIAL
Basophils Absolute: 0 K/uL (ref 0.0–0.1)
Basophils Relative: 0 % (ref 0–1)
Eosinophils Absolute: 0.3 K/uL (ref 0.0–0.7)
Eosinophils Relative: 2 % (ref 0–5)
Lymphocytes Relative: 12 % (ref 12–46)
Lymphs Abs: 1.5 K/uL (ref 0.7–4.0)
Monocytes Absolute: 1.4 K/uL — ABNORMAL HIGH (ref 0.1–1.0)
Monocytes Relative: 10 % (ref 3–12)
Neutro Abs: 9.9 K/uL — ABNORMAL HIGH (ref 1.7–7.7)
Neutrophils Relative %: 76 % (ref 43–77)

## 2010-07-04 LAB — PROTIME-INR
INR: 1.1 (ref 0.00–1.49)
Prothrombin Time: 14.1 s (ref 11.6–15.2)

## 2010-07-04 LAB — CROSSMATCH: Antibody Screen: NEGATIVE

## 2010-07-04 LAB — CBC
HCT: 23.1 % — ABNORMAL LOW (ref 39.0–52.0)
HCT: 40.7 % (ref 39.0–52.0)
Hemoglobin: 13.8 g/dL (ref 13.0–17.0)
Hemoglobin: 8.1 g/dL — ABNORMAL LOW (ref 13.0–17.0)
MCHC: 33.9 g/dL (ref 30.0–36.0)
MCHC: 34.2 g/dL (ref 30.0–36.0)
MCHC: 34.8 g/dL (ref 30.0–36.0)
MCV: 92.6 fL (ref 78.0–100.0)
MCV: 93.5 fL (ref 78.0–100.0)
Platelets: 144 K/uL — ABNORMAL LOW (ref 150–400)
Platelets: 80 K/uL — ABNORMAL LOW (ref 150–400)
RBC: 2.5 MIL/uL — ABNORMAL LOW (ref 4.22–5.81)
RBC: 3.45 MIL/uL — ABNORMAL LOW (ref 4.22–5.81)
RBC: 4.35 MIL/uL (ref 4.22–5.81)
RDW: 13.4 % (ref 11.5–15.5)
RDW: 13.9 % (ref 11.5–15.5)
WBC: 11.9 K/uL — ABNORMAL HIGH (ref 4.0–10.5)
WBC: 12 10*3/uL — ABNORMAL HIGH (ref 4.0–10.5)
WBC: 13.1 K/uL — ABNORMAL HIGH (ref 4.0–10.5)

## 2010-07-04 LAB — HEMOGLOBIN AND HEMATOCRIT, BLOOD
HCT: 36.3 % — ABNORMAL LOW (ref 39.0–52.0)
Hemoglobin: 12.3 g/dL — ABNORMAL LOW (ref 13.0–17.0)

## 2010-07-04 LAB — POCT I-STAT, CHEM 8
BUN: 31 mg/dL — ABNORMAL HIGH (ref 6–23)
Calcium, Ion: 1.19 mmol/L (ref 1.12–1.32)
Chloride: 102 meq/L (ref 96–112)
Creatinine, Ser: 2.1 mg/dL — ABNORMAL HIGH (ref 0.4–1.5)
Glucose, Bld: 93 mg/dL (ref 70–99)
HCT: 44 % (ref 39.0–52.0)
Hemoglobin: 15 g/dL (ref 13.0–17.0)
Potassium: 4.5 meq/L (ref 3.5–5.1)
Sodium: 137 meq/L (ref 135–145)
TCO2: 26 mmol/L (ref 0–100)

## 2010-07-04 LAB — COMPREHENSIVE METABOLIC PANEL WITH GFR
ALT: 22 U/L (ref 0–53)
AST: 27 U/L (ref 0–37)
Albumin: 3.7 g/dL (ref 3.5–5.2)
Alkaline Phosphatase: 74 U/L (ref 39–117)
BUN: 25 mg/dL — ABNORMAL HIGH (ref 6–23)
CO2: 26 meq/L (ref 19–32)
Calcium: 9 mg/dL (ref 8.4–10.5)
Chloride: 104 meq/L (ref 96–112)
Creatinine, Ser: 1.85 mg/dL — ABNORMAL HIGH (ref 0.4–1.5)
GFR calc non Af Amer: 35 mL/min — ABNORMAL LOW
Glucose, Bld: 99 mg/dL (ref 70–99)
Potassium: 4.5 meq/L (ref 3.5–5.1)
Sodium: 138 meq/L (ref 135–145)
Total Bilirubin: 1.7 mg/dL — ABNORMAL HIGH (ref 0.3–1.2)
Total Protein: 6.3 g/dL (ref 6.0–8.3)

## 2010-07-04 LAB — BASIC METABOLIC PANEL
BUN: 29 mg/dL — ABNORMAL HIGH (ref 6–23)
Chloride: 105 mEq/L (ref 96–112)
GFR calc non Af Amer: 35 mL/min — ABNORMAL LOW (ref >60)
Potassium: 4.5 mEq/L (ref 3.5–5.1)
Sodium: 136 mEq/L (ref 135–145)

## 2010-07-04 LAB — URINALYSIS, ROUTINE W REFLEX MICROSCOPIC
Bilirubin Urine: NEGATIVE
Glucose, UA: NEGATIVE mg/dL
Hgb urine dipstick: NEGATIVE
Ketones, ur: NEGATIVE mg/dL
Nitrite: NEGATIVE
Protein, ur: NEGATIVE mg/dL
Specific Gravity, Urine: 1.01 (ref 1.005–1.030)
Urobilinogen, UA: 0.2 mg/dL (ref 0.0–1.0)
pH: 6.5 (ref 5.0–8.0)

## 2010-07-11 ENCOUNTER — Ambulatory Visit (INDEPENDENT_AMBULATORY_CARE_PROVIDER_SITE_OTHER): Payer: Medicare Other | Admitting: *Deleted

## 2010-07-11 ENCOUNTER — Other Ambulatory Visit: Payer: Self-pay | Admitting: Internal Medicine

## 2010-07-11 ENCOUNTER — Telehealth: Payer: Self-pay | Admitting: Internal Medicine

## 2010-07-11 DIAGNOSIS — I472 Ventricular tachycardia: Secondary | ICD-10-CM

## 2010-07-11 DIAGNOSIS — Z95810 Presence of automatic (implantable) cardiac defibrillator: Secondary | ICD-10-CM

## 2010-07-11 DIAGNOSIS — I5022 Chronic systolic (congestive) heart failure: Secondary | ICD-10-CM

## 2010-07-12 NOTE — Telephone Encounter (Signed)
Patient received letter re: missed transmission.   Transmission resent and received 4/18

## 2010-07-18 ENCOUNTER — Encounter: Payer: Self-pay | Admitting: *Deleted

## 2010-07-20 NOTE — Progress Notes (Signed)
icd remote check w/icm  

## 2010-08-07 NOTE — Assessment & Plan Note (Signed)
[name]                         ELECTROPHYSIOLOGY OFFICE NOTE   NAME:[name] [name] GLASSCOCK                     MRN:          [id number]  DATE:08/11/2007                            DOB:          October 23, 1922    [name] [name] returns for follow up.  He is a very pleasant elderly male  with a history of ischemic cardiomyopathy, ventricular tachycardia and  SVT.  He is status post ablation of his SVT.  He returns today for  follow-up.  He has been stable.  He denies chest pain or shortness of  breath.  He remains active.  He denies medical noncompliance.   CURRENT MEDICATIONS:  1. Actonel 35 a week.  2. Folate 800 mg daily.  3. Aspirin 81 daily.  4. Lipitor 10 mg daily.  5. Nitrostat 0.4 mg p.r.n.  6. Metoprolol 50 mg half-tablet twice daily.   He is not on an ACE inhibitor secondary to intolerance.  He returns  today for follow-up.  He had no specific complaints today.   PHYSICAL EXAM:  He is a pleasant, elderly man in no distress.  Blood pressure 136/67, pulse was 56 and regular, respirations were 18.  The weight was 155 pounds.  NECK:  No jugular distention.  LUNGS:  Clear bilaterally to auscultation.  No wheezes, rales or rhonchi  were present.  CARDIOVASCULAR:  Regular rate and rhythm, normal S1 and S2.  EXTREMITIES:  No edema.   Interrogation of his defibrillator demonstrates a Medtronic Maximo.  P  and R waves were 2 and 8, respectively, the impedance 536 in the A, 512  at the V.  Threshold was 1 V at 0.2 in both the right atrium and the  right ventricle.  The battery voltage was 2.95 V.  His underlying rhythm  was sinus bradycardia at 56 beats per minute.  There are no intercurrent  IC therapies.  He had no mode switching.  He was 21% A-paced, 2% V-  paced.   IMPRESSION:  1. Ischemic cardiomyopathy.  2. Ventricular tachycardia.  3. Supraventricular tachycardia, status post ablation.   DISCUSSION:  Overall [name] [name] is stable.  His  defibrillator is  working normally.  His heart failure is well-controlled.  He is going to  return to see [place] in a year.  We will do [facility] over the phone in  several months.     Doylene Canning. Ladona Ridgel, MD  Electronically Signed    GWT/MedQ  DD: 08/11/2007  DT: 08/11/2007  Job #: [id number]

## 2010-08-07 NOTE — Assessment & Plan Note (Signed)
[facility]                            CARDIOLOGY OFFICE NOTE   NAME:[name] [name], [name] [name]                     MRN:          [id number]  DATE:08/05/2006                            DOB:          [birth year]    [name] [name] is a very pleasant 77 year old married male followed here  for more than 30 years.  In 1977 he had a acute myocardial infarction  complicated by a ventricular fibrillation.  He essentially had  percutaneous intervention.  He has developed moderate left ventricular  dysfunction with a EF of 45%.   Because of inducible monomorphic VT, he had a implantable Cardioverter  defibrillator by Dr. Ladona Ridgel.   More recently he had episodes of a supraventricular tachycardia and was  seen by Dr. Ladona Ridgel of Aug 06, 2005 and underwent RF ablation which has  been successful and symptomatically is much improved.   He has also had obstructive uropathy, a subsequent TUR by Dr. Vonita Moss  is November and is much improved with a recent BUN of 29, creatinine of  1.7.  He is followed by Dr. Emilie Rutter.   He is feeling well with no cardiac symptoms, no palpitations.   MEDICATIONS:  1. Toprol XL 25 mg.  2. Lipitor 10 mg.  3. Aspirin 81 mg.  4. Folic acid.  5. Aricept.  6. Levothyroxine 75 mg.  7. Actonel.   Blood pressure 130/68, pulse normal sinus rhythm.  GENERAL APPEARANCE:  Normal.  JVP not elevated.  Carotid pulses palpable and equal without bruits.  LUNGS:  Clear.  CARDIAC:  No murmur or gallop.  ABDOMINAL EXAM:  Unremarkable.  EXTREMITIES:  Normal.   DIAGNOSIS:  As above and is noted on May 05, 2006 dictation.   EKG reveals inferior myocardial infarction.   I have suggested the patient have a lipid profile in the near future and  followup for his cardiology care with Dr. Ladona Ridgel.  The patient states it  is just as easy for him to come to [place] as it is to go to  [place].     Cecil Cranker, MD, [facility]     EJL/MedQ  DD:  08/05/2006  DT: 08/06/2006  Job #: [id number]

## 2010-08-07 NOTE — H&P (Signed)
NAMEDIONNE, ROSSA              ACCOUNT NO.:  [id number]   MEDICAL RECORD NO.:  [id number]          PATIENT TYPE:  INP   LOCATION:  1621                         FACILITY:  [facility]   PHYSICIAN:  Madlyn Frankel. Charlann Boxer, M.D.  DATE OF BIRTH:  1922-06-29   DATE OF ADMISSION:  06/28/2008  DATE OF DISCHARGE:                              HISTORY & PHYSICAL   REASON FOR ADMISSION:  Right hip pain.   HISTORY OF PRESENT ILLNESS:  An 77 year old male was tripped up by his  dog today, fell and had immediate right hip pain with inability to bear  weight.  He was brought to the emergency department.  X-rays revealed a  right femoral neck fracture.   PAST HISTORY:  1. Osteoporosis.  2. Hypertension.  3. Pacemaker due to ischemic cardiomyopathy.   FAMILY HISTORY:  Noncontributory.   SOCIAL HISTORY:  Nonsmoker.   DRUG ALLERGIES:  No known drug allergies.   MEDICATION:  1. Metoprolol 50 mg 1/2 tablet p.o. daily.  2. Levothyroxine 175 mcg p.o. daily.  3. Actonel 35 mg weekly.  4. Folic acid 800 mcg daily.  5. Aspirin 81 mg p.o. daily.  6. Nitrate 0.4 mg p.r.n.  7. Lipitor 10 mg p.o. daily.  8. Nasacort p.r.n.  9. Flomax 0.4 mg p.o. daily.   REVIEW OF SYSTEMS:  See HPI.   PHYSICAL EXAMINATION:  Pulse 94, respirations 16, blood pressure 146/90.  GENERAL:  He is medicated.  HEENT:  Has bruising to the left side of his face.  It is nontender to  palpation.  NECK:  Supple with some bruising as well.  No carotid bruits.  CHEST:  Lungs clear to auscultation bilaterally.  BREASTS:  Deferred.  HEART:  S1-S2 distinct.  ABDOMEN:  Soft, nontender, bowel sounds present.  PELVIS:  Stable.  GENITOURINARY:  Deferred.  EXTREMITIES:  Right leg shortened and externally rotated.  SKIN:  Brisk capillary refill.  NEUROLOGIC:  Intact distal sensibilities.   LABORATORY DATA:  Labs, EKG, chest x-ray pending emergency department  evaluation.   IMPRESSION:  Right femoral neck fracture.   PLAN OF ACTION:   Right total hip replacement by Dr. Charlann Boxer.  Risks and  complications were discussed with the patient and family present.     ______________________________  Yetta Glassman. [address], [place]      Madlyn Frankel. Charlann Boxer, M.D.  Electronically Signed    BLM/MEDQ  D:  06/28/2008  T:  06/28/2008  Job:  [id number]

## 2010-08-07 NOTE — Discharge Summary (Signed)
NAMESTAFFORD, RIVIERA              ACCOUNT NO.:  [id number]   MEDICAL RECORD NO.:  [id number]          PATIENT TYPE:  INP   LOCATION:  1621                         FACILITY:  [facility]   PHYSICIAN:  Ollen Gross, M.D.    DATE OF BIRTH:  1922-07-16   DATE OF ADMISSION:  06/28/2008  DATE OF DISCHARGE:  07/02/2008                               DISCHARGE SUMMARY   ADDENDUM:   Please note that Mr. Guastella' hemoglobin increased to 10.7 after  transfusion of 2 units packed red blood cells on April 9.  His sodium  remained stable 129 and his creatinine improved to 1.64 from 1.9.  He is  ready for discharge to skilled nursing facility on April 10.  Discharge  in stable condition.  See regular discharge summary for medications on  discharge.      Ollen Gross, M.D.  Electronically Signed     FA/MEDQ  D:  07/02/2008  T:  07/02/2008  Job:  [id number]

## 2010-08-07 NOTE — Assessment & Plan Note (Signed)
[facility]                         ELECTROPHYSIOLOGY OFFICE NOTE   NAME:[name], [name] EBRON                     MRN:          [id number]  DATE:02/10/2007                            DOB:          [birth year]    [name] [name] returns today for followup.  He is a very pleasant elderly  male with a history of symptomatic bradycardia, ischemic cardiomyopathy,  nonsustained VT, status post ICD implantation, who returns today for  followup.  He denies chest pain or shortness of breath today except that  he does get short of breath with exertion.  He has had no intercurrent  ICD therapies.  He is still taking care of his wife, who is in a  wheelchair.   PHYSICAL EXAMINATION:  He is a pleasant elderly man in no distress.  Blood pressure today was 157/64, pulse 50 and regular.  The respirations  were 18.  The weight was 162 pounds.  NECK:  No jugular venous distention.  LUNGS:  Clear bilaterally to auscultation.  There are no wheezes, rales  or rhonchi present.  CARDIOVASCULAR:  Regular rate and rhythm with a normal S1 and S2.  EXTREMITIES:  No edema.   Interrogation of his defibrillator demonstrates a Medtronic Maximo with  P and R waves of 2 and 10 respectively with an impedance of 568 in the  atrium and 544 in the ventricle.  The threshold has a volt of 0.2 in the  A and a volt of 3 in the V.  Battery voltage was 2.99 volts.  There are  no intercurrent ICD therapies.  He was 29% A-paced.   MEDICATIONS:  1. Synthroid 175 mcg daily.  2. Actonel 35 weekly.  3. Folate.  4. Aspirin 81 a day.  5. Lipitor 20 mg 1/2 tablet daily.  6. Nitrostat.  7. Aricept.  8. Levoxyl.   IMPRESSION:  1. Ischemic cardiomyopathy .  2. Congestive heart failure.  3. Status post implantable cardioverter/defibrillator insertion.   DISCUSSION:  Overall, [name] [name] is stable.  His defibrillator is  working normally.  We will see him back in six months for fasting lipids  and a  cardiology followup.    Doylene Canning. Ladona Ridgel, MD  Electronically Signed   GWT/MedQ  DD: 02/10/2007  DT: 02/11/2007  Job #: [id number]

## 2010-08-07 NOTE — H&P (Signed)
[name] [name], [name] [name]              ACCOUNT NO.:  [id number]   MEDICAL RECORD NO.:  [id number]          PATIENT TYPE:  INP   LOCATION:  5013                         FACILITY:  [facility]   PHYSICIAN:  Madlyn Frankel. Charlann Boxer, M.D.  DATE OF BIRTH:  [birth year]   DATE OF ADMISSION:  10/05/2008  DATE OF DISCHARGE:                              HISTORY & PHYSICAL   REASON FOR ADMISSION:  Right hip pain in the setting of recent right  total hip replacement after falling on the right side.   HISTORY OF PRESENT ILLNESS:  An 77 year old male who fell onto his right  side while walking his dog.  He had immediate pain on his right hip and  right leg when he was bearing weight.  He was brought to the emergency  department via EMS.  In the emergency department, he was evaluated, x-  ray was performed, as well as a CT scan of his right hip.  CT scan  revealed an acute transverse fracture to the trochanteric region of the  femur.  It may have enter into the prosthesis tract.  The Orthopedic  Service was called, and he was evaluated in the emergency department.  Reports no other chest pain, syncope, or other complications as a result  of a fall.   PAST MEDICAL HISTORY:  1. Osteoporosis.  2. Hypertension.  3. Pacemaker.   PAST SURGICAL HISTORY:  Right total hip replacement.   FAMILY HISTORY:  Noncontributory.   SOCIAL HISTORY:  He lives at home with his wife, who is in a wheelchair.  This is a second fall within the past 3-1/2 months.   DRUG ALLERGIES:  No known drug allergies.   MEDICATIONS:  1. Aricept 10 mg p.o. nightly.  2. Levoxyl 75 mcg p.o. daily.  3. Lipitor 10 mg p.o. daily.  4. Metoprolol 50 mg 1/2 tablet p.o. b.i.d.  5. Aspirin 81 mg p.o. daily.  6. Folic acid daily.   REVIEW OF SYSTEMS:  See HPI.   PHYSICAL EXAMINATION:  VITAL SIGNS:  Pulse 52, respiratory rate 16,  blood pressure 134/59.  GENERAL:  Awake, alert, comfortable in supine position.  HEENT:  Normocephalic.  NECK:  Supple.   No carotid bruits.  CHEST:  Lung sounds clear to auscultation bilaterally.  CHEST:  Deferred.  HEART:  S1 and S2 distinct.  ABDOMEN:  Soft.  Bowel sounds present.  PELVIS:  Stable.  GENITOURINARY:  Noncontributory.  EXTREMITIES:  Right lower extremity has difficulty with straight leg  raise.  He has thin body habitus.  SKIN:  He has abrasion on right shin.  NEUROLOGIC:  He has intact distal sensibilities right lower extremity.   RADIOLOGY:  CT scan, right hip shows a right greater trochanter acute  transverse fracture that possibly extends into the lateral portion of  the hip prosthesis tract.   IMPRESSION:  Acute right trochanteric fracture, transverse in nature in  the setting of recent right total hip replacement with pain with  weightbearing.   PLAN OF ACTION:  1. Admit for PT/OT consultation and evaluation.  2. Social work consultation for living situation, would be a  skilled      nursing facility placement candidate based upon unsafe living      conditions and multiple falls recently.  3. For pain control.   Attending physician, Dr. Charlann Boxer will speak with family practitioner as  well as helping coordination of discharge.  All questions were  encouraged, answered, and reviewed.     ______________________________  Yetta Glassman [address], [place]      Madlyn Frankel. Charlann Boxer, M.D.  Electronically Signed    BLM/MEDQ  D:  10/05/2008  T:  10/06/2008  Job:  [id number]

## 2010-08-07 NOTE — Op Note (Signed)
NAMELEIF, LOFLIN              ACCOUNT NO.:  [id number]   MEDICAL RECORD NO.:  [id number]          PATIENT TYPE:  INP   LOCATION:  1621                         FACILITY:  [facility]   PHYSICIAN:  Madlyn Frankel. Charlann Boxer, M.D.  DATE OF BIRTH:  1923-02-19   DATE OF PROCEDURE:  06/30/2008  DATE OF DISCHARGE:                               OPERATIVE REPORT   PREOPERATIVE DIAGNOSIS:  Right femoral neck fracture, displaced,  complicated by preoperative osteoarthritis of the right hip with  symptoms and radiographic findings noted for that.   POSTOPERATIVE DIAGNOSIS:  Right femoral neck fracture, displaced,  complicated by preoperative osteoarthritis of the right hip with  symptoms and radiographic findings noted for that.   PROCEDURE:  Right total hip replacement.   COMPONENTS USED:  DePuy hip system with a size 54 pinnacle cup with a 36  plus 4 neutral Marathon liner and a single cancellous screw and a hole  eliminator, a size 7 high Tri-Lock stem with 36, 1.5 metal ball.   SURGEON:  Madlyn Frankel. Charlann Boxer, M.D.   ASSISTANT:  Yetta Glassman. [address], PA   ANESTHESIA:  General.   BLOOD LOSS:  Approximately 400 cc.   DRAINS:  One.   COMPLICATIONS:  None.   SPECIMENS:  None.   INDICATIONS:  Mr. Swander is an 77 year old male who presented to the  emergency room on June 28, 2008, after being knocked down by his dog.  He sustained a displaced femoral neck fracture.  Review of radiographs  reveal osteoarthritis in the acetabular region in addition to his  femoral head.  He did report hip pain that he was doing a little bit of  physical therapy for preoperatively.  This was an anterior thigh pain.  Based on the fracture pattern and his arthritis, I discussed with the  family and the patient total hip replacement versus hemiarthroplasty,  the risks and benefits of both.  Consent was obtained for a right total  hip replacement.   PROCEDURE IN DETAIL:  The patient was brought to operative theater.  Once  adequate anesthesia, preoperative antibiotics, Ancef administered,  the patient was positioned into the left lateral decubitus position with  the right side up.  The right lower extremity was then prescrubbed,  prepped and draped in a sterile fashion.   A time-out was performed identifying the patient, planned procedure and  extremity.  A lateral-based incision was made for posterior approach to  the hip.  The iliotibial band and gluteal fascia were incised  posteriorly.  The short external rotators were taken down and severed  from the posterior capsule, preserving the posterior capsular leaflet to  repair at the end of the case but also to protect the sciatic nerve from  retractors.  The femoral head was removed following a neck osteotomy  into the trochanteric fossa.  This was a subcapital neck fracture with  displacement, thus the osteotomy was necessary to remove the bone.   We then at this point attended to the femur first.  Retractors were  placed.  I opened up the proximal femur with the canal initiator and  then  reamed once with the hand reamer.  I irrigated the canal to prevent  fat emboli.  At this point I broached up to a size 7, which sat nicely  and wedged within the medial and lateral dimensions.   At this point I placed a sponge into the femur and attended to the  acetabulum.  Acetabular exposure was obtained placing retractors.  The  labrum was debrided.  I began reaming with a 44 reamer and reamed up to  a 53 reamer with excellent bony bed preparation.  A 54 pinnacle cup was  then impacted.  It sat at approximately 20 degrees of forward flexion  and 40 degrees of abduction.  I checked with the alignment guide prior  to this.  The cup sat just at the rim of the anterior column had a  portion of the cup exposed superior lateral.  A single cancellous screw  was utilized to support the initial scratch fit.  A 36 plus 4 neutral  Marathon liner was then utilized as a trial  liner and then the trial  reduction was carried out.  With a 7 high neck and 36, 1.5 ball, the hip  instability was very good.  There was no evidence of impingement and  anteversion was approximately 45 degrees.  There was about a millimeter  of shuck and the leg lengths appeared to be equal.  Given these  parameters, all the trial components were removed.  The hole eliminator  was placed in the acetabulum and the final 36 plus 4 neutral Marathon  liner was impacted with good rim fit.  Final 7 high Tri-Lock stem was  then impacted.  It sat fully seated at about the level where the broach  was.  I did retrial with a plus 5 and a 1.5 but felt that there was the  same stability with a 1.5 ball as there was the 5, and there was even  less shuck with a plus 5, so I was not worried about lengthening.  Given  these parameters, the final 36, 1.5 ball was then impacted onto the  clean and dried trunnion and the hip was reduced.  The hip was irrigated  throughout the case and again at this point.  I reapproximated the  posterior capsule superior leaflet using #1 Vicryl.  A medium Hemovac  drain was placed deep.  The iliotibial band and gluteal fascia were then  reapproximated over top of this drain with #1 Vicryl.  The remaining  wound was closed with 2-0 Vicryl and running 4-0 Monocryl on the skin.  The skin was cleaned, dried and dressed sterilely with Steri-Strips and  Mepilex dressing.  He was brought to the recovery room in stable  condition, tolerating the procedure well.      Madlyn Frankel Charlann Boxer, M.D.  Electronically Signed     MDO/MEDQ  D:  06/30/2008  T:  06/30/2008  Job:  [id number]

## 2010-08-07 NOTE — Discharge Summary (Signed)
[name] [name], [name] [name]              ACCOUNT NO.:  [id number]   MEDICAL RECORD NO.:  [id number]          PATIENT TYPE:  INP   LOCATION:  1621                         FACILITY:  [facility]   PHYSICIAN:  Madlyn Frankel. Charlann Boxer, M.D.  DATE OF BIRTH:  November 30, 1922   DATE OF ADMISSION:  06/28/2008  DATE OF DISCHARGE:  07/02/2008                               DISCHARGE SUMMARY   ADMITTING DIAGNOSES:  1. Osteoporosis.  2. Hypertension.  3. Pacemaker due to ischemic cardiomyopathy.   DISCHARGE DIAGNOSES:  1. Osteoporosis.  2. Hypertension.  3. Pacemaker due to ischemic cardiomyopathy.  4. Acute blood loss anemia.   HISTORY OF PRESENT ILLNESS:  An 77 year old male who was tripped up by  his dog, fell, landed on his right hip.  Had inability to bear weight in  view of pain.  Was brought to the emergency department where x-rays  revealed a right femoral neck fracture.  Prior to this fall he had had  signs and symptoms and evaluation of right hip osteoarthritis.  Due to  the fact that he had previously existing symptoms of osteoarthritis with  symptoms resulting from acetabular wear, he was admitted for a right  total hip replacement due to his injury.   PROCEDURE:  A right total knee replacement by surgeon Dr. Durene Romans,  assistant Dwyane Luo PA-C.   CONSULTS:  [employer] for pacemaker management perioperatively.   LABORATORY DATA:  CBC:  White blood cell 11.9, hemoglobin 8.1,  hematocrit 23.1, platelets 80.  Was transfused 2 units.  H and H to  follow.  Metabolic panel:  Sodium 129, potassium 4.1, BUN 34, creatinine  1.9, glucose 130.  UA in emergency department was negative for UTI.   EKG shows sinus rhythm with premature atrial complexes.  It is paced.   HOSPITAL COURSE:  The patient admitted through the emergency room to  orthopedic service.  He is admitted for a right total hip replacement.  Surgery was performed on April 7.  Postoperatively he did well.  He did  have some acute blood loss  anemia as noted.  He, otherwise, remained  afebrile and orthopedically stable.  His dressing was changed.  No  significant drainage from the wound.  He remained neurovascularly intact  for his right lower extremity throughout his course of stay.  He was  weightbearing as tolerated with improving progress.  Per physical  therapy and occupational therapy evaluation, he was able to get from  sitting to standing with minimal assistance with improving ability to  ambulate with the use of rolling walker and assistance and supervision.  He was transfused 2 units of blood on the 9th.  He was going to have his  H and H rechecked prior to discharge on Saturday, and if he was stable  on Saturday then he would be ready for discharge to skilled nursing  facility rehab.   DISCHARGE DISPOSITION:  Discharge to skilled nursing facility  rehabilitation in stable improved condition upon stabilization.   DISCHARGE DIET:  Heart-healthy.   DISCHARGE WOUND CARE:  Keep wound dry.   DISCHARGE PHYSICAL THERAPY:  He is weightbearing  as tolerated with the  use of a rolling walker.  Goals of physical therapy are going to be to  increase strength, increase balance, and encourage independence in  activities of daily living.   DISCHARGE MEDICATIONS:  1. Actonel 35 mg q. weekly.  2. Foley to 800 mg p.o. daily.  3. Aspirin 81 mg, hold.  4. Lipitor 10 mg p.o. daily.  5. Nitrostat 0.4 mg p.r.n.  6. Levoxyl 75 mcg p.o. daily.  7. Aricept 10 mg p.o. q.h.s.  8. Nasacort 55 mcg spray daily each nostril.  9. Metoprolol 50 mg 1/2 tab b.i.d.  10.Osteo Bi-Flex daily.  11.Vitamin D 3000 units daily.  12.Lovenox 40 mg subcu q.24 x10 days.  Then start enteric-coated      aspirin when Lovenox is completed.  ECASA 325 mg tablet p.o. daily      x4 weeks.  Then resume his 81 mg daily dosage.  13.Robaxin 500 mg p.o. q.6 p.r.n. muscle spasm pain.  14.Iron 325 mg 1 p.o. t.i.d. x3 weeks.  15.Colace 100 mg 1 p.o. b.i.d.   16.MiraLax 17 g p.o. daily p.r.n.  17.Norco 5/325 one to two p.o. q.4-6 p.r.n. pain.   DISCHARGE FOLLOWUP:  Follow up with Dr. Charlann Boxer at phone number [phone] in  2 weeks for wound check.     ______________________________  Yetta Glassman. [address], [place]      Madlyn Frankel. Charlann Boxer, M.D.  Electronically Signed    BLM/MEDQ  D:  07/01/2008  T:  07/01/2008  Job:  [id number]   cc:   Primary care physician of record

## 2010-08-07 NOTE — Assessment & Plan Note (Signed)
[facility]                         ELECTROPHYSIOLOGY OFFICE NOTE   NAME:Kapur, HAADI SANTELLAN                     MRN:          [id number]  DATE:08/12/2006                            DOB:          1922/09/22    Mr. Hofmann returns today for followup.  He is a very pleasant elderly  male with a history of ischemic cardiomyopathy and SVT, status post  ablation, who also is status post ICD insertion.  The patient returns  today for followup and overall is feeling well.  He denies chest pain,  shortness of breath, or palpitations.   MEDICATIONS:  1. Synthroid 175 mcg daily.  2. Actonel.  3. Folate.  4. Aspirin 81 a day.  5. Lipitor 20 mg 1/2 tablet daily.  6. Toprol XL 50 a day.  7. Aricept.  8. Nitrostat.   PHYSICAL EXAMINATION:  GENERAL:  On exam today, he is a pleasant, well-  appearing elderly man in no acute distress.  VITAL SIGNS:  Blood pressure today was 145/62.  The pulse was 50 and  regular.  The respirations were 18.  The weight was 157 pounds.  NECK:  No jugular venous distention.  LUNGS:  Clear bilaterally to auscultation.  There are no wheezes, rales  or rhonchi.  CARDIOVASCULAR:  Regular rate and rhythm with a normal S1 and S2.  EXTREMITIES:  No edema.   Interrogation of his defibrillator demonstrates a Medtronic Maximo with  P and R waves of 2 and 10 respectively.  The impedence of 484 ohm in the  atrium and 496 in the right ventricle with a threshold volt of 0.2 in  the atrium and a volt of 0.3 in the right ventricle.  The battery  voltage is 3.02 volts.  The underlying rhythm was sinus bradycardia.   IMPRESSION:  1. Ischemic cardiomyopathy.  2. Congestive heart failure.  3. History of ventricular tachycardia.  4. Status post ablation.   DISCUSSION:  Overall, Mr. Popov is stable.  His defibrillator is  working normally.  We will plan to see him back in the office in six  months.     Doylene Canning. Ladona Ridgel, MD  Electronically  Signed    GWT/MedQ  DD: 08/12/2006  DT: 08/13/2006  Job #: [id number]

## 2010-08-07 NOTE — Discharge Summary (Signed)
NAMECHERRY, WITTWER              ACCOUNT NO.:  [id number]   MEDICAL RECORD NO.:  [id number]          PATIENT TYPE:  INP   LOCATION:  5013                         FACILITY:  [facility]   PHYSICIAN:  Madlyn Frankel. Charlann Boxer, M.D.  DATE OF BIRTH:  1922/07/24   DATE OF ADMISSION:  10/05/2008  DATE OF DISCHARGE:  10/10/2008                               DISCHARGE SUMMARY   ADMITTING DIAGNOSES:  1. Right hip pain, status post fall.  2. Osteoporosis.  3. Hypertension.  4. Pacemaker.   DISCHARGE DIAGNOSES:  1. Nondisplaced periprosthetic right trochanteric femur fracture.  2. Osteoporosis.  3. Hypertension.  4. Pacemaker.  5. Chronic falls.   HISTORY OF PRESENT ILLNESS:  An 77 year old male who fell on his right  side while walking his dog.  He had immediate pain, difficulty bearing  weight on his right leg, brought to the emergency department where a CT  scan of his right hip showed an acute transverse fracture to the  trochanteric region in the femur; it was nondisplaced.  It did enter  into the prosthesis tract.  He reported no chest pain, syncope, or other  complications as a result of the fall.  No other injuries reported.   CONSULTS:  1. Physical therapy.  2. Occupational therapy.  3. Clinical social work.   LABORATORY DATA:  None.   HOSPITAL COURSE:  Patient admitted to orthopedic service due to fall.  He was partial weightbearing 50% with the use of a rolling walker.  Physical therapy and occupational therapy evaluated patient while in  hospital.  He had pain with ambulation.  He did make some progress while  in the hospital.  Due to his living arrangements, he was not safe for  return home.  Skilled nursing facility rehab was recommended.  Family  was agreeable.  After being seen and evaluated, he was stable when seen  on October 10, 2008, with improved progress.  He was able to ambulate to  the bathroom with a rolling walker independently.  He could perform a  straight-leg raise of  his right leg although it was uncomfortable.   DISCHARGE DISPOSITION:  Discharge to skilled nurse facility rehab in  stable and improved condition.   DISCHARGE DIET:  Regular.   DISCHARGE MEDICATIONS:  1. Folic acid 800 mcg one p.o. daily.  2. Aspirin 81 mg one p.o. daily.  3. Osteo Bi-Flex triple strength 2 tabs b.i.d.  4. Lipitor 10 mg p.o. b.i.d.  5. Nasacort 1 puff daily.  6. Aricept 10 mg daily.  7. Levoxyl 75 mcg daily.  8. Calcitriol 0.5 mcg daily in the evening.  9. Tylenol 325 to 650 mg p.o. q.6 p.r.n. pain.  10.Robaxin 500 mg p.o. q.6 p.r.n. muscle spasm pain.   DISCHARGE PHYSICAL THERAPY:  He is partial weightbearing at 50% with the  use of a rolling walker.  Limit abduction movements.  Need to work on  his strength and his balance.  This to prevent future falls.   DISCHARGE FOLLOWUP:  Follow up with Dr. Charlann Boxer at phone number [phone] in  3 weeks for x-ray followup.  ______________________________  Yetta Glassman [address], [place]      Madlyn Frankel. Charlann Boxer, M.D.  Electronically Signed    BLM/MEDQ  D:  10/10/2008  T:  10/10/2008  Job:  [id number]

## 2010-08-10 NOTE — Cardiovascular Report (Signed)
NAME:  [name] [name], [name] [name]                        ACCOUNT NO.:  [id number]   MEDICAL RECORD NO.:  [id number]                   PATIENT TYPE:  OIB   LOCATION:  6526                                 FACILITY:  [facility]   PHYSICIAN:  Salvadore Farber, M.D.             DATE OF BIRTH:  [birth year]   DATE OF PROCEDURE:  04/08/2003  DATE OF DISCHARGE:  04/09/2003                              CARDIAC CATHETERIZATION   PROCEDURE:  Left heart catheterization, left ventriculography, coronary  angiography, stent to OM-3, drug-eluting stent to mid circumflex.   INDICATION:  Mr. Carne is a 77 year old gentleman with a long history of  coronary artery disease status post inferior myocardial infarction in 1977.  Catheterization in 1978 demonstrated a totally occluded RCA.  He has been  treated medically and generally has done well.  Over the past three weeks,  he has suffered chest discomfort with activity such as walking up his  driveway to the mailbox.  Symptoms are relieved with rest.  Given his known  disease and classic symptom, he was referred for diagnostic angiography with  an eye to percutaneous intervention.   PROCEDURAL TECHNIQUE:  Informed consent was obtained.  Under 1% lidocaine  local anesthesia, a 6 French sheath was placed in the right femoral artery  using the modified Seldinger technique.  Diagnostic angiography and  ventriculography were performed using JL-4, JR-4, and pigtail catheters.  Images demonstrated at least moderate stenosis of the distal portion of the  AV groove circumflex and a severe stenosis in the proximal portion of the  third obtuse marginal.  Decision was made to treat the marginal stenosis as  the likely culprit for his recent onset angina.   Anticoagulation was initiated with heparin, Plavix and double bolus  eptifibatide to achieve and maintain an ACT of greater than 200 seconds.  A  Q 3.5 guide was advanced over wire and engaged in the ostium of the left  main.  Further images were obtained after the administration of  intracoronary nitroglycerin.  Attempt was made to pass the Luge wire beyond  the lesions.  However, it could not be manipulated beyond the  angiographically moderate lesion of the AV groove circumflex.  A PT2 wire  was then successfully advanced beyond this and the more distal lesion into  the distal lesion into the distal portion of the third obtuse marginal.  I  then attempted to perform intravascular ultrasound to better assess the  lesion of the AV groove circumflex.  However, the IVUS catheter could not be  passed beyond this lesion.  This inability to pass the IVUS catheter raised  concern that this lesion was more severe than was evident angiographically.  The Voyager balloon was then positioned across the lesion of the AV groove  circumflex and inflated six atmospheres for 32 seconds.  Attempt was then  made to pass 2.25 x 13-mm Pixel stent through this lesion into the distal  lesion.  I was unable to pass this stent.  Therefore, a 2.0 x 9-mm Maverick  was used to further dilate this lesion at 8 atmospheres for two sequential  inflations.  Attempts were then made to place the Pixel in the distal  lesion.  However, it could not be advanced beyond the more proximal lesion.  In these attempts, the guide and wire became dislodged from the coronary.  Guide was therefore changed to a 6 [place] Q 4.0 guide.  Attempt was made to  pass a Luge wire beyond the lesion without success.  PT2 wire was then  successfully advanced beyond it.  A 3.0 x 15-mm Maverick over-the-wire  balloon was then advanced beyond both lesions into the distal portion of the  third obtuse marginal.  Wire was removed and a support wire advanced via the  balloon lumen.  The Maverick balloon was pulled back and used to further  dilate the lesion of the mid circumflex at 10 atmospheres.  With this  further dilatation of the mid circumflex and additional support  provided by  the wire, I was able to pass the Pixel (2.25 x 13) into position at the  distal lesion.  It was deployed at 12 atmospheres.  The lesion in the mid  circumflex was then stented using a 2.75 x 20-mm Taxus at 18 atmospheres.  A  2.25 x 13-mm PowerSail was then advanced into the distal stent and post  dilatation performed at 16 atmospheres.  A 3.0 x 18-mm PowerSail was then  positioned in the proximal stent and post dilation performed at 18  atmospheres.  Final angiogram demonstrated no residual stenosis in either  lesion and TIMI-3 flow to the distal vasculature and no dissection.  The  patient tolerated the procedure well and was transferred to the holding room  in stable condition.   COMPLICATIONS:  None.   FINDINGS:  1. LV:  96/4/8.  EF 44% with severe inferior hypokinesis.  2. No aortic stenosis.  3. Unable to assess mitral regurgitation due to ventricular ectopy during     ventriculography.  4. Left main:  Angiographically normal.  5. LAD:  The LAD is a large vessel giving rise to a single diagonal branch.     The mid LAD has serial 60 and 50% stenoses.  6. Circumflex:  The circumflex is a large vessel giving rise to three obtuse     marginal branches.  It supplies substantial collaterals to the distal     RCA.  The AV groove circumflex has 40% stenosis in its mid section at the     take off of the second obtuse marginal and at least 60% stenosis more     distally.  While only moderately impressive angiographically, this lesion     precluded passage of all but hydrophilic guide wires and stents implying     a more substantial stenosis.  It was thus treated with angioplasty and     subsequent stenting to facilitate passage of equipment to the lesion of     the third obtuse marginal.  The third obtuse marginal had an 80% stenosis     which was stented using a non drug-eluting stent resulting in no residual     stenosis. 7. RCA:  Proximally occluded with distal portion  collateralized primarily     from the circumflex.   IMPRESSION/RECOMMENDATION:  Successful drug-eluting stent placement in the  distal portion of the AV groove circumflex and non drug-eluting stent  placement in  the proximal portion of large third obtuse marginal.  The  patient tolerated the procedure well.  He will be continued on Plavix for  one year.  Aspirin will be continued indefinitely.  Eptifibatide will be  continued for 18 hours.                                               Salvadore Farber, M.D.    WED/MEDQ  D:  05/19/2003  T:  05/19/2003  Job:  [id number]   cc:   Georgina Quint. Plotnikov, M.D. [facility]   Doylene Canning. Ladona Ridgel, M.D.

## 2010-08-10 NOTE — Discharge Summary (Signed)
   NAME:  [name] [name], [name] [name]                        ACCOUNT NO.:  [id number]   MEDICAL RECORD NO.:  [id number]                   PATIENT TYPE:  OIB   LOCATION:  6533                                 FACILITY:  [facility]   PHYSICIAN:  Doylene Canning. Ladona Ridgel, M.D. [facility]           DATE OF BIRTH:  1923/01/22   DATE OF ADMISSION:  02/25/2002  DATE OF DISCHARGE:  02/26/2002                                 DISCHARGE SUMMARY   PRIMARY DIAGNOSIS:  Ischemic cardiomyopathy with inducible ventricular  tachycardia.   HISTORY OF PRESENT ILLNESS:  This 77 year old gentleman with a history of  ischemic cardiomyopathy and nonsustained VT with a wide QRS who initially  spoke to EP back in the summer time.  At that time, he was not sure he  wanted to consider  invasive EP testing.  He subsequently discussed it with  Dr. Cecil Cranker and others and returned for follow-up visit and was  referred for an EP study and possible ICD.   HOSPITAL COURSE:  The patient was admitted and underwent inducible  monomorphic ventricular tachycardia.  He subsequently underwent placement of  a Medtronic dual chamber ICD.  He tolerated the procedure well.  He had no  immediate postoperative complications, although the patient did have a small  hematoma at the site.  His values were within normal limits and he was  discharged to home in stable condition.   DISCHARGE MEDICATIONS:  1. He was discharged on Atenolol 25 mg 1/2 tablet q.d.  2. Pravachol 20 mg nightly.  3. Synthroid 25 mcg nightly.  4. Flomax 0.4 mg q.d.  5. Folic acid 1 mg q.d.  6. Actenol every week.  7. He was to take Tylenol 1-2 tablets q.4-6h. p.r.n. pain.   DIET:  Low fat, low cholesterol, low salt diet.    DISCHARGE INSTRUCTIONS:  Activity and wound care were as per pacemaker  discharge instruction sheet.  The patient was to follow with the pacemaker  clinic on March 10, 2002 at 9:30 a.m. and then with Dr. Doylene Canning. Ladona Ridgel  within three months.     Chinita Pester, C.R.N.P. [facility]                 Doylene Canning. Ladona Ridgel, M.D. [facility]    DS/MEDQ  D:  02/26/2002  T:  02/26/2002  Job:  [id number]   cc:   Cecil Cranker, M.D. [facility]   Doylene Canning. Ladona Ridgel, M.D. [facility]  [address]  [place]  [place] [address]  Fax: [phone]   Device Clinic at [facility]

## 2010-08-10 NOTE — Assessment & Plan Note (Signed)
[facility]                           ELECTROPHYSIOLOGY OFFICE NOTE   NAME:Gracie, CURLY MACKOWSKI                     MRN:          [id number]  DATE:11/28/2005                            DOB:          29-Jan-1923    PROGRESS NOTE:  Mr. Mabry returns today for followup. He is a very  pleasant elderly man with a history of ischemic cardiomyopathy and VT as  well as recent development of SVT, for which his symptoms worsened over time  and essentially became almost incessant. He underwent catheter ablation back  in June with marked improvement in his symptoms. He returns today for  followup and denies palpitations. He denies chest pain. He denies shortness  of breath. He has been under some increased emotional stress with his wife's  Alzheimer's dementia illness, that he is the primary caregiver of.   PHYSICAL EXAMINATION:  GENERAL:  A pleasant, elderly man in no acute  distress.  VITAL SIGNS:  Blood pressure 122/62, pulse 60 and regular. Respiratory rate  18. Weight 159 pounds.  NECK:  No jugular venous distention.  LUNGS:  Clear to auscultation bilaterally.  CARDIOVASCULAR:  Regular rate and rhythm. Normal S1 and S2.  EXTREMITIES:  No edema.   LABORATORY DATA:  The EKG demonstrates sinus rhythm with intraventricular  conduction delay and prior inferior MI.   IMPRESSION:  1. Ischemic cardiomyopathy.  2. Ventricular tachycardia.  3. Supraventricular tachycardia.  4. Status post ablation.   DISCUSSION:  Overall, Mr. Craver is stable. His SVT is well controlled. His  VT is well controlled. We will plan to see him back in the office, as  previously scheduled.                                   Doylene Canning. Ladona Ridgel, MD   GWT/MedQ  DD:  11/28/2005  DT:  11/28/2005  Job #:  [id number]

## 2010-08-10 NOTE — Assessment & Plan Note (Signed)
[facility]                              CARDIOLOGY OFFICE NOTE   NAME:January, ABED SCHAR                     MRN:          [id number]  DATE:01/14/2006                            DOB:          Aug 31, 1922    A very pleasant 77 year old white married male followed here for more than  30 years.  In 1977 he had acute myocardial infarction complicated by a  ventricular fibrillation.  He subsequently had percutaneous intervention,  developed moderate LV dysfunction with an EF of 45%.   He had inducible monomorphic VT, for which he had implantable cardioverter  defibrillator per Dr. Ladona Ridgel.   More recently he developed episodes of supraventricular arrhythmia, was seen  by Dr. Ladona Ridgel on May 15, underwent RF ablation, which was successful.  The  patient is feeling much improved, though he still gets some dyspnea on  exertion and leg fatigue.  He is on Flomax 0.8, Actonel 35, folic acid,  aspirin 81, Lipitor 10, Toprol XL 50, and Levothroid 75.   PHYSICAL EXAM:  Blood pressure 120/68, pulse 50, sinus bradycardia.  GENERAL:  In no distress.  JVP's not elevated.  Carotid pulses palpated without bruits.  LUNGS:  Clear.  CARDIAC:  No murmur.  EXTREMITIES:  Show no edema.   An EKG reveals sinus bradycardia, old inferior MI.  Essentially unchanged.   IMPRESSION:  1. Coronary artery disease, as described above.  2. History of an isolated ventricular tachycardia.  3. Supraventricular tachycardia status post ablation with no recurrence.  4. Fatigue and dyspnea probably related to left ventricular dysfunction.   We plan to get a 2D echo and also we plan to decrease his Toprol to 25.  Should also note that he was recently referred to the nephrologist because  of renal insufficiency.  Plan to see him back in 2 months or p.r.n.    ______________________________  E. Graceann Congress, MD, [facility]    EJL/MedQ  DD: 01/14/2006  DT: 01/15/2006  Job #: [id number]

## 2010-08-10 NOTE — Assessment & Plan Note (Signed)
[facility]                              CARDIOLOGY OFFICE NOTE   NAME:[name] [name], [name] [name]                     MRN:          [id number]  DATE:02/06/2006                            DOB:          05/12/1922    Urgent surgery is planned on November 19 per Dr. Vonita Moss.   He was seen most recently by me on January 14, 2006.  He has a long history  of coronary artery disease, LV dysfunction, history of VTAC,  supraventricular tachycardia status post ablation.   Most recent stress test was March 07, 2004.  I have suggested an  adenosine Myoview prior to clearing him for surgery, as I think he is at  increased risk.  Should note that the March 07, 2004 Myoview revealed a  large infarct or scar on the inferior intraseptal wall.  There was no  ischemia.     Cecil Cranker, MD, [facility]  Electronically Signed    EJL/MedQ  DD: 02/06/2006  DT: 02/06/2006  Job #: [id number]

## 2010-08-10 NOTE — H&P (Signed)
NAMEFLORIAN, [name] [name]              ACCOUNT NO.:  [id number]   MEDICAL RECORD NO.:  [id number]          PATIENT TYPE:  INP   LOCATION:  0002                         FACILITY:  [facility]   PHYSICIAN:  Maretta Bees. Vonita Moss, M.D.DATE OF BIRTH:  03/05/1923   DATE OF ADMISSION:  02/10/2006  DATE OF DISCHARGE:                                HISTORY & PHYSICAL   HISTORY:  This 77 year old gentleman has developed urinary retention,  hydronephrosis and renal insufficiency. He has had a catheter in place and  has failed voiding trials. His creatinine has remained in the 2.1 range. He  does have a history of chronic PSA elevations in the 5 to 6 range. Also has  bilateral renal cysts on ultrasound. He is admitted now for TUR of the  prostate after obtaining surgical clearance from Dr. Corinda Gubler who did a  stress test and some scar tissue was found and diminished ejection fraction  but felt like he was a reasonable risk, and fluid intake is to be watched.   PAST HISTORY:  Includes cardiac problems including ischemic cardiomyopathy,  ventricular tachycardia, supraventricular tachycardia, and history of  cardiac ablation and placement of a defibrillator. Other medical problems  include hypertension and gout. He also has osteoporosis.   Previous procedures have included appendectomy and hernia repairs. He has  also had surgery for detached retina and cataract surgery.   CURRENT MEDICATIONS:  1. Actonel 35 mg a week.  2. Calcium and vitamin D 600 mg at bedtime.  3. Metoprolol 25 mg a day.  4. Lipitor 10 mg a day.  5. Flomax 0.4 mg.  6. Levothroid 0.075 mg per day.  7. Folic acid 800 mg daily.  8. Osteo Bi-Flex daily.  9. Aspirin 81 mg which he has stopped at the present time.   ALLERGIES:  To drugs are denied.   He does not smoke or drink alcohol.   REVIEW OF SYSTEMS:  Is noted on health history form which I initialed.   His blood pressure is 115/64, temperature 98, pulse 58.  He is alert and  oriented. He appears younger than stated age.  SKIN:  Is warm and dry.  NECK:  Supple.  HEART:  Tones regular.  CHEST:  Clear.  ABDOMEN:  Soft and nontender. No CVA or bladder tenderness. No  hepatosplenomegaly or hernias.  Penis, urethra, meatus, scrotum, testicles and epididymis remarkable for  Foley catheter, prostate ____________ benign rectal exam.   IMPRESSION:  1. BNC.  2. Urinary retention.  3. Hydronephrosis.  4. Renal insufficiency.  5. Bilateral renal cysts.  6. Elevated PSAs.  7. Hypertension.  8. Gout.  9. Coronary disease.  10.Ventricular tachycardia in the past.  11.Implanted defibrillator.  12.Anemia.   PLAN:  TUR of the prostate.      Maretta Bees. Vonita Moss, M.D.  Electronically Signed     LJP/MEDQ  D:  02/10/2006  T:  02/10/2006  Job:  [id number]   cc:   Cecil Cranker, MD, [facility]  [address]  [address]  [place]  [place] [address]   Dyke Maes, M.D.  Fax: [phone]  Evie Lacks. Plotnikov, MD  [address]  [place] [address]

## 2010-08-10 NOTE — Discharge Summary (Signed)
NAMEMARCE, CHARLESWORTH              ACCOUNT NO.:  [id number]   MEDICAL RECORD NO.:  [id number]          PATIENT TYPE:  INP   LOCATION:  1432                         [facility]ILITY[facility]  [facility]   PHYSICIAN:  Maretta Bees. Vonita Moss, M.D.DATE OF BIRTH:  08/11/1922   DATE OF ADMISSION:  02/10/2006  DATE OF DISCHARGE:  02/13/2006                               DISCHARGE SUMMARY   FINAL DIAGNOSES:  1. Bladder neck contracture.  2. Chronic prostatitis.  3. Urinary retention.  4. Hydronephrosis.  5. Renal insufficiency.  6. Bilateral renal cysts.  7. History of PSA elevation.  8. Hypertension.  9. Gout.  10.Coronary artery disease.  11.History of ventricular tachycardia.  12.Implanted defibrillator.  13.Anemia.  14.Osteoporosis.   PROCEDURE:  Transurethral resection of the prostate.   HISTORY:  This gentleman was admitted because of urinary retention,  hydronephrosis, and renal insufficiency that have been improved with a  catheter.  His creatinine is in the range of 2.  He has had previous PSA  elevation to the level of 5-6.   PHYSICAL EXAMINATION:  GENITOURINARY:  Included Foley catheter in place  but otherwise unremarkable.   HOSPITAL COURSE:  After admission, he underwent TUR of the prostate and  his post-op course was uneventful.  He voided satisfactorily after his  Foley catheter was removed.   DISCHARGE MEDICATIONS:  1. Actonel.  2. Calcium and vitamin D.  3. Metoprolol.  4. Lipitor.  5. Flomax.  6. Levothroid.  7. Aspirin 81 mg.  8. Osteo Bioflex.  9. Folic acid.   He was sent home in improved condition.   He will return to the office in 2 weeks in followup.   He will resume as tolerated and have somewhat limited activity with no  excessive straining for 4 weeks.      Maretta Bees. Vonita Moss, M.D.  Electronically Signed     LJP/MEDQ  D:  04/10/2006  T:  04/10/2006  Job:  [id number]

## 2010-08-10 NOTE — Op Note (Signed)
[name] [name], [name] [name]              ACCOUNT NO.:  [id number]   MEDICAL RECORD NO.:  [id number]          PATIENT TYPE:  INP   LOCATION:  2807                         FACILITY:  [facility]   PHYSICIAN:  Doylene Canning. Ladona Ridgel, M.D.  DATE OF BIRTH:  Oct 24, 1922   DATE OF PROCEDURE:  09/13/2005  DATE OF DISCHARGE:                                 OPERATIVE REPORT   PROCEDURE PERFORMED:  Left flank study and radiofrequency catheter ablation  of AV node reentry tachycardia.   INTRODUCTION:  The patient is a 77 year old male with a history of syncope  and ischemic cardiomyopathy who is status post angioplasty and stenting on  multiple occasions, status post MI, status post ICD implantation.  The  patient has had recurrent episodes of tachypalpitations typically at a rate  of approximately 100 beats per minute.  He states that normally his heart  rate is in the 60-70 range, typically even as low as the 50s but in the last  several weeks, he is noted to his heart rate has been in the 95-110 range  almost consistently.  He was seen in our office and was found to be in SVT,  which was terminate with atrial pacing.  He now refers for ablation.   PROCEDURE:  After informed consent was obtained, the patient was taken to  the diagnostic EP lab in fasting state.  After the usual preparation and  draping, intravenous fentanyl was given for sedation.  A 6-French hexapolar  catheter was inserted percutaneously in the right jugular vein and advanced  to the coronary sinus.  A 5-French quadripolar catheter was inserted  percutaneously in the right femoral vein and advanced to the RV apex.  A 5-  French quadripolar catheter was inserted percutaneously in the right femoral  vein and advanced to the His bundle region.  After measurement of the basic  intervals, mapping was carried out.  Of note, the patient was in SVT during  the onset of the ablation procedure.  Mapping demonstrated a short RP  tachycardia with a VA  time of approximately 40 msec.  The patient had PVCs  placed at the time of His bundle refractoriness, demonstrating no atrial pre-  excitation.  Ventricular pacing did demonstrate a VAV conduction pattern in  SVT.  In addition, ventricular pacing to terminate the tachycardia either by  straight V-pacing or with a PVC.  With all the above, a diagnosis of AV node  reentry tachycardia was made and the 7-French quadripolar ablation catheter  was maneuvered in the usual Koch's triangle region.  A total of 5 RF energy  applications were delivered for site 7 to site 9 in Koch's triangle.  During  this there was accelerated junctional rhythm.  Following ablation,  additional rapid ventricular pacing was carried out from the RV apex  demonstrating VA dissociation at 600 msec.  Programmed ventricular  stimulation was carried out from the RV apex, also demonstrating VA  dissociation at 600 msec.  Rapid atrial pacing was carried out from the  coronary sinus as well as the right atrium with a pacing cycle length of 600  msec and stepwise decreased down to 450 msec, where AV Wenckebach was  observed.  During rapid atrial pacing the PR interval was less than the RR  interval.  Programmed atrial stimulation was then carried out from the  coronary sinus in the high right atrium at base drive cycle lengths of 161  and 700 msec.  The S1-S2 interval was stepwise decreased down to 290 msec,  where AV node ERP was observed.  During programmed atrial stimulation there  were no AH jumps, no echo beats, no inducible SVT following ablation.  At  the conclusion of the procedure the patient was observed for 30 minutes and  no recurrent inducible SVT.  The catheters were then removed.  Hemostasis  was assured and the patient was returned to his room in satisfactory  condition.   COMPLICATIONS:  There were no immediate procedure complications.   RESULTS:  A.  ECG:  Baseline ECG demonstrates SVT at 100 beats per  minute.   B.  Baseline intervals:  The HV interval was 51 msec, the PR interval was  180 msec.  The QRS duration 120 msec.  Sinus node cycle length was 1197  msec.   C.  Rapid ventricular pacing:  Rapid atrial pacing following ablation  demonstrated no inducible SVT.  It should be noted that prior to ablation,  rapid ventricular pacing resulted in the initiation of SVT.   D.  Programmed ventricular stimulation:  Programmed ventricular stimulation  was carried out from the RV apex at base drive cycle length of 096 msec.  This demonstrated VA dissociation.   E.  Rapid atrial pacing:  Rapid atrial pacing was carried out catheter  ablation and demonstrated an AV Wenckebach cycle length of 450 msec.  During  rapid atrial pacing the PR interval was less than the RR interval and there  was no inducible SVT following ablation.  Prior to ablation, rapid atrial  pacing resulted in inducible SVT   F.  Programmed atrial stimulation:  Programmed atrial stimulation was  carried out from the coronary sinus in the high right atrium at base drive  cycle lengths of 045 as well 700 msec.  The S1-S2 interval was stepwise  decreased down to 290 msec, where the AV node ERP was observed.  During  programmed atrial stimulation, there no AH jumps and no echo beats following  ablation.  Prior to ablation probing stimulation resulted in initiation of  SVT.   G.  Arrhythmias observed:  AV node reentry tachycardia.  Initiation:  Spontaneous, with catheter manipulation, and with pacing.  Duration:  Sustained.  Termination was spontaneous or with catheter ablation or  programmed ventricular stimulation.   H.  Mapping:  Mapping of Koch's triangle demonstrated the usual size and  orientation.   1.  Radiofrequency energy application:  A total of five RF energy      applications were delivered to the slow pathway region resulting in     accelerated junctional rhythm.  Following ablation there was no       inducible SVT.   CONCLUSION:  This study demonstrates successful with electrophysiologic  study and radiofrequency catheter ablation of typical and incessant AV node  reentry tachycardia with a total of five radiofrequency energy applications  delivered to the slow pathway region, rendering the tachycardia  noninducible.           ______________________________  Doylene Canning. Ladona Ridgel, M.D.     GWT/MEDQ  D:  09/13/2005  T:  09/13/2005  Job:  [id number]   cc:   Cecil Cranker, M.D.  [address]  [address]  [place]  [place] [address]

## 2010-08-10 NOTE — Op Note (Signed)
NAMECHANEL, MCKESSON              ACCOUNT NO.:  [id number]   MEDICAL RECORD NO.:  [id number]          PATIENT TYPE:  INP   LOCATION:  0002                         FACILITY:  [facility]   PHYSICIAN:  Maretta Bees. Vonita Moss, M.D.DATE OF BIRTH:  [birth year]   DATE OF PROCEDURE:  02/10/2006  DATE OF DISCHARGE:                                 OPERATIVE REPORT   PREOPERATIVE DIAGNOSES:  1. Benign prostatic hypertrophy.  2. Large urinary retention.  3. Large capacity bladder.  4. Bilateral hydronephrosis.  5. Renal insufficiency.   POSTOPERATIVE DIAGNOSES:  1. Benign prostatic hypertrophy.  2. Large urinary retention.  3. Large capacity bladder.  4. Bilateral hydronephrosis.  5. Renal insufficiency.   OPERATION/PROCEDURE:  Transurethral resection of the prostate.   SURGEON:  Maretta Bees. Vonita Moss, M.D.   ANESTHESIA:  General.   INDICATIONS:  This 77 year old gentleman has gone into urinary retention.  He has a very large capacity bladder with bilateral hydronephrosis.  He is  brought to the OR today for transurethral resection of the prostate to make  him catheter-free and try and resolve further progression of hydronephrosis,  felt secondary to bladder hypertrophy.   DESCRIPTION OF PROCEDURE:  The patient was brought to the operating room,  placed in the lithotomy position.  External genitalia prepped and draped in  the usual fashion.  He was prepped and draped in the usual fashion.  He was  sounded at 30-French without difficulty and a 28-French resectoscope sheath  was inserted.  The bladder had trabeculation but no stones, tumors,  inflammatory lesions.  He had mainly bilobar hypertrophy.  Transurethral  resection of the prostate was performed, resecting the bladder neck at  first, then resecting both lateral lobes down the capsule.  Also there was a  fair amount of tissue removed from the bladder floor and some anterior  tissue.  He had a very vascular prostate required some tedious  electrocoagulation.  He had a large capacity bladder.  At the end of the  procedure, it was felt that he was well resected and residual chips were  removed from the bladder and further fulguration of bleeders was done and 24-  [place], 30 mL Foley inserted and connected to closed drainage  after filling the balloon with 45 mL and getting clear irrigation.  He was  taken to the recovery room in stable condition.  His blood pressure was in  the 90s and he will get a stat CBC and BMET in the recovery room and may  require blood transfusion in view of his initial hemoglobin of 10.4  preoperatively.      Maretta Bees. Vonita Moss, M.D.  Electronically Signed     LJP/MEDQ  D:  02/10/2006  T:  02/10/2006  Job:  [id number]   cc:   Cecil Cranker, MD, [facility]  [address]  [address]  [place]  [place] [address]   Dyke Maes, M.D.  Fax: [phone]   Georgina Quint. Plotnikov, MD  [address]  [place]  [place] [address]

## 2010-08-10 NOTE — Discharge Summary (Signed)
[name] [name], YUTZY              ACCOUNT NO.:  [id number]   MEDICAL RECORD NO.:  [id number]          PATIENT TYPE:  INP   LOCATION:  2807                         FACILITY:  [facility]   PHYSICIAN:  Maple Mirza, P.A. DATE OF BIRTH:  [birth year]   DATE OF ADMISSION:  09/13/2005  DATE OF DISCHARGE:  09/14/2005                                 DISCHARGE SUMMARY   This gentleman has no known drug allergies.   PRINCIPAL DIAGNOSES:  1.  Discharge day #1 status post electrophysiology study/radiofrequency      catheter ablation of AVNRT.  2.  History of tachy-palpitation, despite medical therapy.   SECONDARY DIAGNOSES:  1.  History of inferior myocardial infarction in 1977, complicated by      ventricular fibrillation with finding of 100% occlusion of the right      coronary artery.  Medical therapy since then.  2.  Left heart cauterization, January 2005, for accelerating angina.      1.  Stent to the A-V groove of the circumflex (drug-eluding stent).      2.  Stent to the proximal OM-3 (bare-metal stent).  3.  Treated hypothyroidism.  4.  Benign prostatic hypertrophy.  5.  I-II congestive heart failure symptoms.  6.  Status post ICD implantation for inducible monomorphic ventricular      tachycardia.  7.  Dyslipidemia.  8.  Status post bilateral inguinal herniorrhaphies.  9.  Status post appendectomy.  10. Status post cataract repair, left eye.  11. Renal insufficiency with admission of 2.3.   PROCEDURE:  September 13, 2005, electrophysiology study/radiofrequency catheter  ablation of an incessant A-V nodal reentry tachycardia.  Five radiofrequency  catheter ablations delivered to a slow P wave to modify this, with the  patient developing accelerated junctional rhythm.  The patient has had no  postprocedure complications.   BRIEF HISTORY:  Mr. Muzquiz is a pleasant 77 year old male.  He has been  followed by Dr. Corinda Gubler for the last 30 years.  In 1977, he had acute  inferior myocardial  infarction.  It was complicated by ventricular  fibrillation.  He subsequently had percutaneous interventions in 2005 for  accelerating angina.   The patient had an ejection fraction of 45%, but was found to have inducible  monomorphic ventricular tachycardia and underwent implantation of  cardioverter defibrillator by Dr. Ladona Ridgel.   Recently, the patient has had recurrent episodes of tachy-palpitations.  Medications have not help reduce his tachy-palpitations and it was  recommended that electrophysiology study with ablation of a supraventricular  tachycardia would serve him best.  At this time, he notes almost daily  recurrence of these tachy-palpitations and they make him feel weak.  He has  no chest pain or dizziness.  At office visit, he has a tachycardia with a  rate of 100.   HOSPITAL COURSE:  The patient presents electively, September 13, 2005.  He  underwent radiofrequency catheter ablation of what was determined to be an  incessant A-V nodal reentry tachycardia by Dr. Lewayne Bunting.  The patient  has had, as mentioned above, no postprocedure complications.  The  cauterization sites are  healing nicely without hematoma.  His rhythm is a  sinus bradycardia.   The patient goes home on his preadmission of medications, which are:  1.  Levothroid 50 mcg daily.  2.  Flomax 0.4 mg 2 tabs daily.  3.  Actonel 35 mg weekly.  4.  Folic acid 800 mcg daily.  5.  Lipitor 10 mg daily at bedtime.  6.  Nasacort spray as needed.  7.  Toprol XL 50 mg daily.  8.  Nitroglycerin tablets 1 tab under the tongue every 5 minutes x3 doses as      needed for chest pain.   He is to follow up at [facility] [address] with Dr.  Ladona Ridgel, Wednesday October 16, 2005, at 11:45.   LABORATORY STUDIES PERTINENT TO THIS ADMISSION:  Complete blood count:  White cells 9.7, hemoglobin 12.1, hematocrit 34.2, platelets 138.  Prothrombin time is 12.1, INR 1.  Serum electrolytes:  Sodium 141, potassium   4.4, chloride 109, carbonate 26, glucose 97, BUN 30, creatinine 2.3.   This is a 30-minute dictation.      Maple Mirza, P.A.     GM/MEDQ  D:  09/13/2005  T:  09/13/2005  Job:  [id number]   cc:   Doylene Canning. Ladona Ridgel, M.D.  [address]  [address]  [place]  [place] [address]   Georgina Quint. Plotnikov, M.D. [facility]  [address]  [place]  [place] [address]

## 2010-08-10 NOTE — Op Note (Signed)
[place]  Patient:    [name] [name], [name] [name]                     MRN: [id number] Proc. Date: 08/11/00 Adm. Date:  [id number] Attending:  Gennie Alma CC:         Cecil Cranker, M.D. [facility]   Operative Report  PREOPERATIVE DIAGNOSIS:  Right inguinal hernia.  POSTOPERATIVE DIAGNOSIS:  Indirect right inguinal hernia.  OPERATION: Repair of right inguinal hernia with mesh.  SURGEON:  Milus Mallick, M.D.  ANESTHESIA:  Local infiltration with 1% Xylocaine with 1:200,000 parts of epinephrine - 27 cc and monitored anesthesia care.  DESCRIPTION OF PROCEDURE:  Under adequate perioperative intravenous sedation, the patients abdomen was prepared and draped in usual fashion.  Satisfactory local and field block anesthesia was instilled with the above anesthetic in the right inguinal canal.  An oblique incision was made overlying the right inguinal canal and carried down through Scarpas and Campers fascia. Bleeders were electrocoagulated and one large vein was divided over hemostats and ligated with 4-0 Vicryl.  The external oblique aponeurosis was exposed and opened in the direction of its fibers down through the external inguinal ring. The ilioinguinal nerve was identified and placed in a position of protection during the procedure.  The spermatic cord was encircled with a Penrose drain and dissected up out of the inguinal canal.  There was weakness of the floor of the inguinal canal, but there was no direct hernia.  There was an indirect hernia that was dissected off of the spermatic cord and reduced into the preperitoneal space.  The hernia was then repaired with Atrium mesh that was cut to appropriate size and sewn down on the floor of the inguinal canal.  The apex suture was into the pubic tubercle and on the inferior side, the mesh was fixed to the inguinal ligament.  On its superior size, it was affixed to the conjoined tendon.  All of the  sutures were done with 0 Surgilon.  The lateral aspect of the mesh was slit in order to accommodate the spermatic cord and the ilioinguinal nerve.  The tails of the mesh were crossed lateral to the internal inguinal ring and fixed to each other with interrupted sutures of 0 Surgilon.  Hemostasis was ascertained.  The external oblique aponeurosis was closed over the cord and the mesh with continuous suture of 3-0 Vicryl.  The newly constructed external inguinal ring admitted the tip of the index finger. Scarpas fascia was closed with continuous suture of 4-0 Vicryl.  Subcuticular layer was reapproximated with a continuous suture of 5-0 Vicryl and half-inch Steri-Strips were applied to the skin.  A sterile dressing was applied. Estimated blood loss for the procedure was negligible.  Patient tolerated the procedure well and left the operating room in satisfactory condition. DD:  08/11/00 TD:  08/11/00 Job: [id number]

## 2010-08-10 NOTE — Assessment & Plan Note (Signed)
[facility]                            CARDIOLOGY OFFICE NOTE   NAME:[name] [name], [name] [name]                     MRN:          [id number]  DATE:05/05/2006                            DOB:          02/18/1923    The patient is a very pleasant 77 year old white married male, followed  here for more than 30 years.  In 1977 he had acute myocardial infarction  complicated by ventricular fibrillation.  Subsequently had percutaneous  intervention, developed  moderate LV dysfunction with an EF of 45%.   He had inducible monomorphic VT and had a implantable cardioverter  defibrillator per Dr. Ladona Ridgel.   More recently he has had some episodes of supraventricular  arrhythmia,and was  seen Dr. Ladona Ridgel on Aug 06, 2005 and underwent RF  ablation which was successful.  Subsequently the patient had some  increase in renal insufficiency, was seen by Dr. Briant Cedar who felt that  he had obstructive uropathy, subsequently had TUR per Dr. Vonita Moss and  blood work has improved with recent BUN of 33, creatinine 1.47.   The patient is feeling well at this time.   MEDICATIONS:  Include:  1. Flomax 0.8.  2. Actonel 35.  3. Folic Acid.  4. Aspirin 81.  5. Lipitor 10.  6. Levothroid 75.  7. Toprol XL 50.   PHYSICAL EXAMINATION:  Shows blood pressure 180/68, pulse 55, normal  sinus rhythm.  General appearance normal.  JVP not elevated, carotid pulses palpable equally without bruits.  LUNGS:  Are clear.  CARDIAC:  Reveals no murmur or gallop.   IMPRESSION:  1. Coronary artery disease with ischemic cardiomyopathy.  2. Ventricular tachycardia - automatic implantable cardioverter      defibrillation.  3. Supraventricular tachycardia, status post radiofrequency (RF)      ablation.  4. Recent transurethral resection for obstructive uropathy with      improvement in renal function.   I would suggest that the patient decrease the Toprol XL to 25 b.i.d.  Apparently this was  suggested previously but not done.  I will see him  back in 3 months or p.r.n.     E. Graceann Congress, MD, [facility]  Electronically Signed    EJL/MedQ  DD: 05/05/2006  DT: 05/06/2006  Job #: [id number]

## 2010-08-10 NOTE — Op Note (Signed)
NAME:  CLARKSON, ROSSELLI                        ACCOUNT NO.:  [id number]   MEDICAL RECORD NO.:  [id number]                   PATIENT TYPE:  OIB   LOCATION:  2899                                 FACILITY:  [facility]   PHYSICIAN:  Doylene Canning. Ladona Ridgel, M.D. [facility]           DATE OF BIRTH:  15-Dec-1922   DATE OF PROCEDURE:  02/25/2002  DATE OF DISCHARGE:                                 OPERATIVE REPORT   PROCEDURE:  Invasive electrophysiologic study followed by insertion of a  dual-chamber ICD.   INDICATIONS FOR PROCEDURE:  Nonsustained ventricular tachycardia, ischemic  cardiomyopathy with ejection fraction of 30%, left bundle branch block, with  inducible monomorphic VT and EP testing.   PROBLEM #1 INDUCTION:  The patient is a very pleasant 76 year old male with  a history of ischemic cardiomyopathy and baseline left bundle branch block  and AV Wenckebach. He has first-degree heart block as well. The patient is  now referred for invasive electrophysiologic study and risk stratification.   DESCRIPTION OF PROCEDURE:  After informed consent was obtained, the patient  was taken diagnostic EP lab in the fasted state. After the usual preparation  and draping, intravenous fentanyl and midazolam was given for sedation. A 5-  French quadripolar catheter was inserted percutaneously in the right jugular  vein and advanced to the coronary sinus. A 5-French quadripolar catheter was  inserted percutaneously in the right femoral vein and advanced to the RV  apex. A 5-French quadripolar catheter was inserted percutaneously in the  right femoral vein and advanced to the His bundle region. Rapid ventricular  pacing was carried out from the RV apex demonstrating baseline V8  association at 600 milliseconds. Programmed ventricular stimulation was then  carried out from the RV apex at base adjusted cycle length of 600  milliseconds. Programmed ventricular stimulation was carried out from the RV  apex with a base  adjusted cycle length of 600 and 400 milliseconds. S1S2,  S1S2S3, and S1S2S3S4 stimuli were subsequently delivered with the S1S2, S2S3  and S3S4 intervals stepwise decreased down ventricular fractions. During  programmed ventricular stimulation, there was inducible monomorphic VT. This  was a very rapid hemodynamically unstable VT with indeterminate access  although it appeared to be right bundle branch block QRS morphology with a  left superior access. Cycle length was approximately 210 milliseconds.  Multiple attempts at pace termination were carried out, the tachycardia  could not be terminated and the patient was subsequently defibrillated. At  this point, the patient was prepped for ICD insertion after the catheters  were removed.   After the usual preparation and draping, intravenous fentanyl and midazolam  was given for sedation and a total of 30 cc of lidocaine was infiltrated  into the left infraclavicular region. A 9 cm incision was carried out over  this region and electrocautery utilized to dissected down to the  subpectoralis fascia. 10 cc of contrast demonstrated a patent left  subclavian  vein. The vein was subsequently punctured x2 and the Medtronic  model 5076, 52 cm active fixation atrial pacing lead serial #[id number] was  advanced into the right atrium and the Medtronic model 6947, 65 cm active  fixation lead, serial #[id number] (ICD lead) was advanced into the right  ventricle. Mapping was cleared out in the right ventricle. The patient's  right ventricle was not particularly large. R waves 13 millivolts and once  the lead was actively fixed in the right ventricle with the patient's  threshold at 0.5 volts at 0.5 milliseconds with a pacing impedence of 723  ohms. Interval pacing in the right ventricle did not result in diaphragmatic  stimulation. With the ventricular lead in satisfactory position, attention  was turned to the atrial lead. T waves measured 4.1 millivolts  in the right  atrial appendage in the atrial threshold once the lead was actively fixed  with 0.7 volts at 0.5 milliseconds. The pacing impedence was 554 ohms. 10  volt pacing did not result in diaphragmatic stimulation when pacing the  right atrium. With right atrial and defibrillation leads in satisfactory  position, they were secured to the subpectoralis fascia with a figure-of-  eight silk suture. __________ were also secured with silk suture.  Electrocautery was utilized to make a subcutaneous pocket and assure  hemostasis. Kanamycin irrigation was utilized to irrigate the pocket. The  Medtronic maximal DR model 7278 dual chamber defibrillator serial  [id number] was advanced and connected to the atrial and defibrillation lead  and placed in a subcutaneous pocket. The generator was secured with a silk  suture. Additional kanamycin was utilized to irrigate the pocket and  defibrillation threshold testing then carried out.   Dictation ended at this point.                                               Doylene Canning. Ladona Ridgel, M.D. [facility]    GWT/MEDQ  D:  02/25/2002  T:  02/25/2002  Job:  [id number]

## 2010-08-10 NOTE — Discharge Summary (Signed)
NAME:  [name] [name], [name] [name]                        ACCOUNT NO.:  [id number]   MEDICAL RECORD NO.:  [id number]                   PATIENT TYPE:  OIB   LOCATION:  6526                                 FACILITY:  [facility]   PHYSICIAN:  Cecil Cranker, M.D.             DATE OF BIRTH:  07/05/1922   DATE OF ADMISSION:  04/08/2003  DATE OF DISCHARGE:  04/09/2003                                 DISCHARGE SUMMARY   BRIEF HISTORY:  This is a pleasant 77 year old male who was seen in the  office by Dr. Corinda Gubler on April 05, 2003. The patient has a long history of  coronary artery disease. He is status post acute diaphragmatic MI in 1977  complicated by ventricular fibrillation with subsequent coronary angiography  in 1978 revealing a totally occluded right coronary artery with moderate  disease to the mid circumflex and minimal disease of the proximal LAD,  circumflex, and obtuse marginal. The patient was treated medically at that  time.   The patient had a coronary angiogram in March of 1993. At that time, his  ejection fraction was noted to be 44%. He had done well until that time  although he did develop LV dysfunction and wide QRS with inducible  ventricular tachycardia and subsequently underwent implant of a  defibrillator performed by Dr. Ladona Ridgel.   When seen in the office on April 05, 2003, the patient reported anginal  symptoms x3 weeks. A decision was made to admit the patient for further  evaluation.   PAST MEDICAL HISTORY:  See cardiac history as noted above. 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 He is status post multiple surgeries. He  has history of a subdural hematoma.   ALLERGIES:  No known drug allergies.   HOSPITAL COURSE:  As noted, this patient was admitted to [facility]  for cardiac catheterization secondary to three weeks of anginal type  symptoms. He underwent cardiac catheterization on April 08, 2003 performed  by Dr. Samule Ohm. Please see his  dictated report for full details. He underwent  PTCA and stenting of the third obtuse marginal. It was recommended that he  be treated with Plavix for at least one year. His ejection fraction was 44%  at time of catheterization. Please see Dr. Melinda Crutch dictated note for full  details.   The following day, the patient was doing well. Decision was made to proceed  with discharge, and he was discharged in stable and improved condition.   LABORATORY DATA:  CBC on the day of discharge revealed hemoglobin 12.9,  hematocrit 37, WBCs 10,400, platelets 135,000. The platelets had been  111,000 on the previous CBC. CK-MB the day prior to discharge revealed a CK  of 106, MB 5.0, index 4.7.   DISCHARGE MEDICATIONS:  1. Atenolol 25 mg one half tablet daily.  2. Levothyroxine 15 mcg daily.  3. Flomax 0.8 mg at bedtime.  4. Actonel as previously taken.  5. Folic acid 1 mg daily.  6. Osteo Bi-Flex as previously used.  7. Aspirin 81 mg daily.  8. Pravachol as previously used.  9. Norvasc 10 mg daily.  10.      Nitroglycerin p.r.n.  11.      The patient was placed on Plavix which he is to continue for one     year per Dr. Melinda Crutch note. HE was told he could take Tylenol as needed.   DISCHARGE INSTRUCTIONS:  The patient was told to avoid any strenuous  activity or driving for at least two days. He was to be on a low salt, low  fat diet. He was told to call the office if he had any increased pain  swelling, or bleeding from his groin.   The patient was told to call Dr. Corinda Gubler Monday for a followup appointment  in one to two weeks or he could follow up with the physician assistant,  Tereso Newcomer. He was to see Dr. Posey Rea as needed or as scheduled.   PROBLEM LIST:  1. Status post percutaneous transluminal coronary angioplasty stenting of     the third obtuse marginal performed April 08, 2003; ejection fraction     44% at time of catheterization.  2. History of coronary artery disease with  previous myocardial infarction     and previous catheterization as noted above.  3. History of left ventricular dysfunction with ejection fraction of 30 to     40%.  4. History of ventricular tachycardia status post AICD per Dr. Ladona Ridgel.  5. History of chronic obstructive pulmonary disease and asbestos exposure.  6. History of subdural hematoma.  7. History of multiple surgeries.  8. Mildly low platelet count.      Delton See, P.A. [facility]                  Cecil Cranker, M.D.    DR/MEDQ  D:  04/09/2003  T:  04/09/2003  Job:  [id number]   cc:   Georgina Quint. Plotnikov, M.D. [facility]

## 2010-08-13 ENCOUNTER — Other Ambulatory Visit: Payer: Self-pay | Admitting: Internal Medicine

## 2010-09-12 ENCOUNTER — Other Ambulatory Visit (INDEPENDENT_AMBULATORY_CARE_PROVIDER_SITE_OTHER): Payer: Medicare Other

## 2010-09-12 DIAGNOSIS — R748 Abnormal levels of other serum enzymes: Secondary | ICD-10-CM

## 2010-09-12 DIAGNOSIS — I10 Essential (primary) hypertension: Secondary | ICD-10-CM

## 2010-09-12 LAB — HEPATIC FUNCTION PANEL
AST: 24 U/L (ref 0–37)
Albumin: 4.1 g/dL (ref 3.5–5.2)
Alkaline Phosphatase: 110 U/L (ref 39–117)

## 2010-09-12 LAB — BASIC METABOLIC PANEL
GFR: 42.25 mL/min — ABNORMAL LOW (ref >60.00)
Glucose, Bld: 91 mg/dL (ref 70–99)
Potassium: 4.6 mEq/L (ref 3.5–5.1)
Sodium: 135 mEq/L (ref 135–145)

## 2010-09-13 ENCOUNTER — Other Ambulatory Visit: Payer: Self-pay | Admitting: Internal Medicine

## 2010-09-13 DIAGNOSIS — R748 Abnormal levels of other serum enzymes: Secondary | ICD-10-CM

## 2010-09-13 DIAGNOSIS — I10 Essential (primary) hypertension: Secondary | ICD-10-CM

## 2010-09-17 ENCOUNTER — Encounter: Payer: Self-pay | Admitting: Internal Medicine

## 2010-09-18 ENCOUNTER — Ambulatory Visit (INDEPENDENT_AMBULATORY_CARE_PROVIDER_SITE_OTHER): Payer: Medicare Other | Admitting: Internal Medicine

## 2010-09-18 ENCOUNTER — Encounter: Payer: Self-pay | Admitting: Internal Medicine

## 2010-09-18 VITALS — BP 100/62 | HR 80 | Temp 98.3°F | Resp 16 | Ht 66.0 in | Wt 149.0 lb

## 2010-09-18 DIAGNOSIS — E039 Hypothyroidism, unspecified: Secondary | ICD-10-CM

## 2010-09-18 DIAGNOSIS — I2510 Atherosclerotic heart disease of native coronary artery without angina pectoris: Secondary | ICD-10-CM

## 2010-09-18 DIAGNOSIS — I10 Essential (primary) hypertension: Secondary | ICD-10-CM

## 2010-09-18 DIAGNOSIS — N259 Disorder resulting from impaired renal tubular function, unspecified: Secondary | ICD-10-CM

## 2010-09-18 DIAGNOSIS — K624 Stenosis of anus and rectum: Secondary | ICD-10-CM

## 2010-09-18 NOTE — Assessment & Plan Note (Signed)
Procedure: Anoscopy Indication: hard to push stool through Risks and benefits were explained to pt in detail. He was placed in lateral decubitus position. Digital rectal exam was painful. Stool was guaiac negative. Narrow anus. Anoscope was introduced with difficulty. No masses. Upon withdrawal mucosa was observed. There was a 5 mm prolapsing polyp at 9 o'clock. Impression: Anal stenosis. Anal polyp. Tolerated well. Complications - none.  Surgical consult

## 2010-09-18 NOTE — Progress Notes (Signed)
Subjective:    Patient ID: [name] [name], male    DOB: December 02, 1922, 77 y.o.   MRN: [id number]  HPI  The patient presents for a follow-up of  chronic hypertension, chronic dyslipidemia, CAD controlled with medicines C/o difficultie passing stool x 1 year "narrow hole". No blood. C/o itching  Past Medical History  Diagnosis Date  . Hypertension   . Thyroid disease   . CAD (coronary artery disease)     Dr. Ladona Ridgel  . Osteoarthritis   . BPH (benign prostatic hypertrophy)     Dr. Vonita Moss  . Osteoporosis     (took actonel x 5y)  . Renal insufficiency     Dr. Merrily Brittle  . Low back pain 2009  . Hip fx     x 2 on R.  . Sternum fx 2011  . Abnormal laboratory test 2011    elevated alk p   Past Surgical History  Procedure Date  . Heart disease     permanent pacemaker  . Appendectomy   . Inguinal hernia repair   . Hip fracture surgery 2011    ORIF  R.    reports that he has quit smoking. He does not have any smokeless tobacco history on file. He reports that he does not drink alcohol. His drug history not on file. family history includes Coronary artery disease in an unspecified family member and Hypertension in an unspecified family member. No Known Allergies Current Outpatient Prescriptions on File Prior to Visit  Medication Sig Dispense Refill  . aspirin 81 MG EC tablet Take 81 mg by mouth daily.        Marland Kitchen atorvastatin (LIPITOR) 20 MG tablet Take 10 mg by mouth 2 (two) times daily.        . Cholecalciferol (VITAMIN D3) 1000 UNITS tablet Take 1,000 Units by mouth daily.        Marland Kitchen donepezil (ARICEPT) 10 MG tablet TAKE 1 TABLET DAILY  90 tablet  2  . folic acid (FOLVITE) 800 MCG tablet Take 400 mcg by mouth daily.        Marland Kitchen levothyroxine (SYNTHROID, LEVOTHROID) 100 MCG tablet Take 100 mcg by mouth daily.        . metoprolol (TOPROL-XL) 50 MG 24 hr tablet Take 25 mg by mouth 2 (two) times daily.        . Misc Natural Products (OSTEO BI-FLEX ADV DOUBLE ST) TABS Take by mouth daily.         . nitroGLYCERIN (NITROSTAT) 0.4 MG SL tablet Place 0.4 mg under the tongue every 5 (five) minutes as needed. For up to 3 doses.        Marland Kitchen triamcinolone (NASACORT AQ) 55 MCG/ACT nasal inhaler Place into the nose daily.         BP 100/62  Pulse 80  Temp(Src) 98.3 F (36.8 C) (Oral)  Resp 16  Ht 5\' 6"  (1.676 m)  Wt 149 lb (67.586 kg)  BMI 24.05 kg/m2  Review of Systems  Constitutional: Negative for appetite change, fatigue and unexpected weight change.  HENT: Negative for nosebleeds, congestion, sore throat, sneezing, trouble swallowing and neck pain.   Eyes: Negative for itching and visual disturbance.  Respiratory: Negative for cough.   Cardiovascular: Negative for chest pain, palpitations and leg swelling.  Gastrointestinal: Positive for rectal pain. Negative for nausea, diarrhea, blood in stool, abdominal distention and anal bleeding.  Genitourinary: Negative for frequency, hematuria and flank pain.  Musculoskeletal: Negative for back pain, joint swelling and gait problem.  Skin: Negative for rash.  Neurological: Negative for dizziness, tremors, speech difficulty and weakness.  Psychiatric/Behavioral: Negative for sleep disturbance, dysphoric mood and agitation. The patient is not nervous/anxious.        Objective:   Physical Exam  Constitutional: He is oriented to person, place, and time. He appears well-developed.  HENT:  Mouth/Throat: Oropharynx is clear and moist.  Eyes: Conjunctivae are normal. Pupils are equal, round, and reactive to light.  Neck: Normal range of motion. No JVD present. No thyromegaly present.  Cardiovascular: Normal rate, regular rhythm, normal heart sounds and intact distal pulses.  Exam reveals no gallop and no friction rub.   No murmur heard. Pulmonary/Chest: Effort normal and breath sounds normal. No respiratory distress. He has no wheezes. He has no rales. He exhibits no tenderness.  Abdominal: Soft. Bowel sounds are normal. He exhibits no  distension and no mass. There is no tenderness. There is no rebound and no guarding.  Genitourinary: Guaiac negative stool.       See anoscopy report  Musculoskeletal: Normal range of motion. He exhibits no edema and no tenderness.  Lymphadenopathy:    He has no cervical adenopathy.  Neurological: He is alert and oriented to person, place, and time. He has normal reflexes. No cranial nerve deficit. He exhibits normal muscle tone. Coordination normal.  Skin: Skin is warm and dry. No rash noted.  Psychiatric: He has a normal mood and affect. His behavior is normal. Judgment and thought content normal.       Lab Results  Component Value Date   WBC 9.7 01/12/2010   HGB 13.7 01/12/2010   HCT 40.1 01/12/2010   PLT 155.0 01/12/2010   CHOL 112 01/12/2010   TRIG 94.0 01/12/2010   HDL 38.20* 01/12/2010   ALT 26 09/12/2010   AST 24 09/12/2010   NA 135 09/12/2010   K 4.6 09/12/2010   CL 101 09/12/2010   CREATININE 1.7* 09/12/2010   BUN 36* 09/12/2010   CO2 26 09/12/2010   TSH 0.44 09/12/2010   PSA 3.67 01/12/2010   INR 1.1 ratio* 08/17/2009      Assessment & Plan:

## 2010-09-18 NOTE — Assessment & Plan Note (Signed)
On Rx 

## 2010-09-18 NOTE — Assessment & Plan Note (Signed)
Cont Rx 

## 2010-09-18 NOTE — Assessment & Plan Note (Signed)
Monitoring labs 

## 2010-10-01 ENCOUNTER — Encounter: Payer: Self-pay | Admitting: Internal Medicine

## 2010-10-03 ENCOUNTER — Telehealth (INDEPENDENT_AMBULATORY_CARE_PROVIDER_SITE_OTHER): Payer: Self-pay

## 2010-10-03 NOTE — Telephone Encounter (Signed)
I called and the wife answered the phone. I tried to get some information about why the patient was coming in the office and to try and reschedule the appointment.The wife was not clear and was hard to communicate with so I left a message for her to have patient call me back so I can reschedule his appointment.

## 2010-10-04 ENCOUNTER — Ambulatory Visit (INDEPENDENT_AMBULATORY_CARE_PROVIDER_SITE_OTHER): Payer: Medicare Other | Admitting: General Surgery

## 2010-10-04 ENCOUNTER — Ambulatory Visit (INDEPENDENT_AMBULATORY_CARE_PROVIDER_SITE_OTHER): Payer: PRIVATE HEALTH INSURANCE | Admitting: General Surgery

## 2010-10-04 ENCOUNTER — Encounter (INDEPENDENT_AMBULATORY_CARE_PROVIDER_SITE_OTHER): Payer: Self-pay | Admitting: General Surgery

## 2010-10-04 DIAGNOSIS — K624 Stenosis of anus and rectum: Secondary | ICD-10-CM

## 2010-10-04 NOTE — Progress Notes (Signed)
The patient was referred to me by Dr. Trinna Post Plotnikov. He is referred because of anal stenosis small stools and an anorectal mass.  The patient has multiple medical problems.  I focused my examination on the patient's anorectal problem. He has a small anterior and rectal polyp which is not appeared to be the reason for the patient's difficulty having normal caliber stools he has a small posterior anal skin tag also.  On digital examination the patient has a very tight anal sphincter. There was some mild mucosal tearing during the process of the examination. On anoscopy I could not visualize any significant growth or lesion  Impression: A suture gentleman with an anal sphincter tenesmus possibly leading to small caliber stools. I believe that a GI consultation will be in order that the patient in the benefit from a botulin injection of the same suture.  There is no surgical procedure necessary at this time.

## 2010-10-11 ENCOUNTER — Other Ambulatory Visit: Payer: Self-pay | Admitting: Internal Medicine

## 2010-10-11 ENCOUNTER — Ambulatory Visit (INDEPENDENT_AMBULATORY_CARE_PROVIDER_SITE_OTHER): Payer: Medicare Other | Admitting: *Deleted

## 2010-10-11 DIAGNOSIS — I2589 Other forms of chronic ischemic heart disease: Secondary | ICD-10-CM

## 2010-10-11 DIAGNOSIS — Z95810 Presence of automatic (implantable) cardiac defibrillator: Secondary | ICD-10-CM

## 2010-10-11 DIAGNOSIS — I495 Sick sinus syndrome: Secondary | ICD-10-CM

## 2010-10-11 DIAGNOSIS — I509 Heart failure, unspecified: Secondary | ICD-10-CM

## 2010-10-11 DIAGNOSIS — I472 Ventricular tachycardia: Secondary | ICD-10-CM

## 2010-10-11 DIAGNOSIS — I428 Other cardiomyopathies: Secondary | ICD-10-CM

## 2010-10-12 LAB — REMOTE ICD DEVICE
AL AMPLITUDE: 2.4 mv
AL IMPEDENCE ICD: 532 Ohm
ATRIAL PACING ICD: 99.7 pct
FVT: 0
RV LEAD AMPLITUDE: 9.9 mv
RV LEAD IMPEDENCE ICD: 418 Ohm
TOT-0001: 1
TOT-0006: 20110602000000
TZAT-0001ATACH: 2
TZAT-0001SLOWVT: 1
TZAT-0001SLOWVT: 2
TZAT-0002ATACH: NEGATIVE
TZAT-0002ATACH: NEGATIVE
TZAT-0002FASTVT: NEGATIVE
TZAT-0004SLOWVT: 8
TZAT-0005SLOWVT: 88 pct
TZAT-0005SLOWVT: 91 pct
TZAT-0018ATACH: NEGATIVE
TZAT-0018ATACH: NEGATIVE
TZAT-0018FASTVT: NEGATIVE
TZAT-0018SLOWVT: NEGATIVE
TZAT-0018SLOWVT: NEGATIVE
TZAT-0019ATACH: 6 V
TZAT-0019ATACH: 6 V
TZAT-0019FASTVT: 8 V
TZAT-0020FASTVT: 1.5 ms
TZON-0004VSLOWVT: 32
TZST-0001FASTVT: 5
TZST-0001FASTVT: 6
TZST-0001SLOWVT: 3
TZST-0001SLOWVT: 6
TZST-0002ATACH: NEGATIVE
TZST-0002FASTVT: NEGATIVE
TZST-0002FASTVT: NEGATIVE
TZST-0002FASTVT: NEGATIVE
TZST-0003SLOWVT: 25 J
TZST-0003SLOWVT: 35 J
VF: 0

## 2010-10-17 NOTE — Progress Notes (Signed)
icd remote check w/icm  

## 2010-10-23 ENCOUNTER — Other Ambulatory Visit: Payer: Self-pay | Admitting: Internal Medicine

## 2010-10-29 ENCOUNTER — Encounter: Payer: Self-pay | Admitting: Internal Medicine

## 2010-10-30 ENCOUNTER — Encounter: Payer: Self-pay | Admitting: *Deleted

## 2010-11-15 ENCOUNTER — Ambulatory Visit: Payer: Medicare Other | Admitting: Internal Medicine

## 2010-11-30 ENCOUNTER — Encounter: Payer: Self-pay | Admitting: Internal Medicine

## 2010-11-30 ENCOUNTER — Ambulatory Visit (INDEPENDENT_AMBULATORY_CARE_PROVIDER_SITE_OTHER): Payer: Medicare Other | Admitting: Internal Medicine

## 2010-11-30 VITALS — BP 118/60 | HR 84 | Ht 68.0 in | Wt 148.0 lb

## 2010-11-30 DIAGNOSIS — K624 Stenosis of anus and rectum: Secondary | ICD-10-CM

## 2010-11-30 MED ORDER — AMBULATORY NON FORMULARY MEDICATION: Status: DC

## 2010-11-30 NOTE — Assessment & Plan Note (Addendum)
He has seen his PCP and a Careers adviser. Notes of their hx and exms, including anoscopies, reviewed.  This appears to be cause of his problems. Has had colonoscopy in past and given age and the available information would not pursue that or flex sig now. He is not a good candidate for surgery. ? If he has an occult fissure. Will treat with diltiazem gel, continue his stool softeners and bowel regimen that seems to be working, and follow-up.dilt gel See AP and JW notes

## 2010-11-30 NOTE — Patient Instructions (Addendum)
Your prescription(s) has(have) been sent to [facility] at [facility] for you to pick up. Return to see Dr. Leone Payor in about 6 weeks. Call back sooner if you continue to have problems or if they worsen.

## 2010-11-30 NOTE — Progress Notes (Signed)
  Subjective:    Patient ID: [name] [name], male    DOB: March 24, 1923, 77 y.o.   MRN: [id number]  HPI This elderly gentleman has had progressively worsening bowel movement difficulties. He has anal stenosis. Stools are getting more narrow there is some pain with defecation but no real bleeding. He says 2 years ago is when things seemed to get worse after he had some tearing during an anal and rectal exam for a prostate exam. He is using a stool softener. Not much abdominal pain. There has been progressive change over the years but this is clearly the worst. He added Metamucil. Moves bowels most days. He has seen a surgeon who did not think any surgical intervention was needed.   Review of Systems Cares for wife who is wheelchair bound, + eyeglasses + impaired gait after hip replacement All other ROS negative    Objective:   Physical Exam General: Well-developed, well-nourished and in no acute distress Vitals: Reviewed and listed above Eyes:anicteric. Mouth and posterior pharynx: normal with dentures.  Neck: supple w/o thyromegaly or mass.  Lungs: clear. Heart: S1S2, no rubs, murmurs, gallops.Distant Abdomen: soft, non-tender, no hepatosplenomegaly, hernia, or mass and BS+.  Rectal:  Tags, anal polyp anal stenosis - able to insert index finger partway but stopped due to pain - this did dilate it some. Lymphatics: no cervical, [place] or inguinal nodes. Extremities:  no edema Skin no rash. Neuro: nonfocal. A&O x 3.  Psych: appropriate mood and  affect.        Assessment & Plan:

## 2010-12-01 ENCOUNTER — Other Ambulatory Visit: Payer: Self-pay | Admitting: Internal Medicine

## 2010-12-03 ENCOUNTER — Encounter: Payer: Self-pay | Admitting: Internal Medicine

## 2010-12-21 ENCOUNTER — Ambulatory Visit (INDEPENDENT_AMBULATORY_CARE_PROVIDER_SITE_OTHER): Payer: Medicare Other | Admitting: Internal Medicine

## 2010-12-21 ENCOUNTER — Encounter: Payer: Self-pay | Admitting: Internal Medicine

## 2010-12-21 DIAGNOSIS — K624 Stenosis of anus and rectum: Secondary | ICD-10-CM

## 2010-12-21 DIAGNOSIS — R279 Unspecified lack of coordination: Secondary | ICD-10-CM

## 2010-12-21 DIAGNOSIS — I10 Essential (primary) hypertension: Secondary | ICD-10-CM

## 2010-12-21 DIAGNOSIS — E039 Hypothyroidism, unspecified: Secondary | ICD-10-CM

## 2010-12-21 DIAGNOSIS — M545 Low back pain, unspecified: Secondary | ICD-10-CM

## 2010-12-21 DIAGNOSIS — I2510 Atherosclerotic heart disease of native coronary artery without angina pectoris: Secondary | ICD-10-CM

## 2010-12-21 DIAGNOSIS — R413 Other amnesia: Secondary | ICD-10-CM

## 2010-12-21 NOTE — Assessment & Plan Note (Signed)
Continue with current prescription therapy as reflected on the Med list.  

## 2010-12-21 NOTE — Assessment & Plan Note (Signed)
better 

## 2010-12-21 NOTE — Assessment & Plan Note (Signed)
He has anal stenosis. Stools are getting more narrow there is some pain with defecation but no real bleeding. He says 2 years ago is when things seemed to get worse after he had some tearing during an anal and rectal exam for a prostate exam. He is using a stool softener. Not much abdominal pain. There has been progressive change over the years but this is clearly the worst. He added Metamucil. Moves bowels most days.  He has seen a surgeon who did not think any surgical intervention was needed.

## 2010-12-21 NOTE — Assessment & Plan Note (Signed)
mild

## 2010-12-21 NOTE — Progress Notes (Signed)
  Subjective:    Patient ID: [name] [name], male    DOB: 07/18/1922, 77 y.o.   MRN: [id number]  HPI  The patient presents for a follow-up of  Chronic anal stenosis, hypertension, chronic dyslipidemia, CAD controlled with medicines    Review of Systems  Constitutional: Negative for appetite change, fatigue and unexpected weight change.  HENT: Negative for nosebleeds, congestion, sore throat, sneezing, trouble swallowing and neck pain.   Eyes: Negative for itching and visual disturbance.  Respiratory: Negative for cough.   Cardiovascular: Negative for chest pain, palpitations and leg swelling.  Gastrointestinal: Negative for nausea, vomiting, abdominal pain, diarrhea, constipation, blood in stool, abdominal distention and rectal pain.  Genitourinary: Negative for frequency and hematuria.  Musculoskeletal: Negative for back pain, joint swelling and gait problem.  Skin: Negative for rash.  Neurological: Negative for dizziness, tremors, speech difficulty and weakness.  Psychiatric/Behavioral: Negative for sleep disturbance, dysphoric mood and agitation. The patient is not nervous/anxious.        Objective:   Physical Exam  Constitutional: He is oriented to person, place, and time. He appears well-developed and well-nourished.  HENT:  Mouth/Throat: Oropharynx is clear and moist.  Eyes: Conjunctivae are normal. Pupils are equal, round, and reactive to light.  Neck: Normal range of motion. No JVD present. No thyromegaly present.  Cardiovascular: Normal rate, regular rhythm, normal heart sounds and intact distal pulses.  Exam reveals no gallop and no friction rub.   No murmur heard. Pulmonary/Chest: Effort normal and breath sounds normal. No respiratory distress. He has no wheezes. He has no rales. He exhibits no tenderness.  Abdominal: Soft. Bowel sounds are normal. He exhibits no distension and no mass. There is no tenderness. There is no rebound and no guarding.  Musculoskeletal: Normal  range of motion. He exhibits no edema and no tenderness.  Lymphadenopathy:    He has no cervical adenopathy.  Neurological: He is alert and oriented to person, place, and time. He has normal reflexes. No cranial nerve deficit. He exhibits normal muscle tone. Coordination normal.  Skin: Skin is warm and dry. No rash noted.  Psychiatric: He has a normal mood and affect. His behavior is normal. Judgment and thought content normal.          Assessment & Plan:

## 2010-12-21 NOTE — Assessment & Plan Note (Addendum)
Mild- declined meds

## 2010-12-28 ENCOUNTER — Other Ambulatory Visit: Payer: Self-pay

## 2010-12-28 ENCOUNTER — Encounter: Payer: Self-pay | Admitting: Internal Medicine

## 2010-12-28 ENCOUNTER — Ambulatory Visit (INDEPENDENT_AMBULATORY_CARE_PROVIDER_SITE_OTHER): Payer: Medicare Other | Admitting: Internal Medicine

## 2010-12-28 DIAGNOSIS — I5022 Chronic systolic (congestive) heart failure: Secondary | ICD-10-CM

## 2010-12-28 DIAGNOSIS — I2510 Atherosclerotic heart disease of native coronary artery without angina pectoris: Secondary | ICD-10-CM

## 2010-12-28 DIAGNOSIS — I428 Other cardiomyopathies: Secondary | ICD-10-CM

## 2010-12-28 LAB — ICD DEVICE OBSERVATION
AL IMPEDENCE ICD: 532 Ohm
AL THRESHOLD: 0.75 V
BATTERY VOLTAGE: 3.1415 V
DEV-0020ICD: NEGATIVE
RV LEAD AMPLITUDE: 10.875 mv
RV LEAD THRESHOLD: 0.75 V
TOT-0006: 20110602000000
TZAT-0001ATACH: 2
TZAT-0001SLOWVT: 2
TZAT-0002ATACH: NEGATIVE
TZAT-0002ATACH: NEGATIVE
TZAT-0002ATACH: NEGATIVE
TZAT-0004SLOWVT: 8
TZAT-0005SLOWVT: 88 pct
TZAT-0005SLOWVT: 91 pct
TZAT-0011SLOWVT: 10 ms
TZAT-0011SLOWVT: 10 ms
TZAT-0012ATACH: 150 ms
TZAT-0012FASTVT: 200 ms
TZAT-0018ATACH: NEGATIVE
TZAT-0018ATACH: NEGATIVE
TZAT-0018FASTVT: NEGATIVE
TZAT-0019ATACH: 6 V
TZAT-0019ATACH: 6 V
TZAT-0019ATACH: 6 V
TZAT-0019FASTVT: 8 V
TZAT-0020ATACH: 1.5 ms
TZAT-0020ATACH: 1.5 ms
TZAT-0020FASTVT: 1.5 ms
TZON-0004VSLOWVT: 32
TZST-0001ATACH: 5
TZST-0001FASTVT: 3
TZST-0001FASTVT: 5
TZST-0001FASTVT: 6
TZST-0001SLOWVT: 3
TZST-0001SLOWVT: 4
TZST-0001SLOWVT: 6
TZST-0002ATACH: NEGATIVE
TZST-0002FASTVT: NEGATIVE
TZST-0002FASTVT: NEGATIVE
TZST-0003SLOWVT: 25 J
TZST-0003SLOWVT: 35 J
VENTRICULAR PACING ICD: 0.02 pct
VF: 0

## 2010-12-28 MED ORDER — NITROGLYCERIN 0.4 MG SL SUBL: 0.4000 mg | SUBLINGUAL_TABLET | SUBLINGUAL | Status: DC | PRN

## 2010-12-28 NOTE — Assessment & Plan Note (Signed)
His device is working normally. We'll plan a recheck in several months. 

## 2010-12-28 NOTE — Assessment & Plan Note (Signed)
He denies anginal symptoms. He'll continue his current medical therapy. I've asked him to increase his exercise. Today we'll refill his sub-lingual nitroglycerin.

## 2010-12-28 NOTE — Patient Instructions (Signed)
Your physician wants you to follow-up in: YEAR WITH DR Court Joy will receive a reminder letter in the mail two months in advance. If you don't receive a letter, please call our office to schedule the follow-up appointment.  Your physician recommends that you continue on your current medications as directed. Please refer to the Current Medication list given to you today.  CARELINK FROM HOME ON 03-28-11

## 2010-12-28 NOTE — Assessment & Plan Note (Signed)
His symptoms are class II. He will continue his current medical therapy and maintains a low-sodium diet.

## 2010-12-28 NOTE — Progress Notes (Signed)
HPI [name] [name] returns today for followup. He has an ischemic cardiomyopathy, chronic systolic heart failure, chronic stable angina, and is status post ICD implantation. He denies chest pain, shortness of breath, or peripheral edema. He notes some memory difficulties. No syncope or ICD shocks. No Known Allergies   Current Outpatient Prescriptions  Medication Sig Dispense Refill  . AMBULATORY NON FORMULARY MEDICATION Diltiazem gel 2% Apply small amount pea-sized to anal area 2-3 times a day for anal stenosis  30 g  1  . aspirin 81 MG EC tablet Take 81 mg by mouth daily.        Marland Kitchen atorvastatin (LIPITOR) 20 MG tablet Take 10 mg by mouth 2 (two) times daily.        . Cholecalciferol (VITAMIN D3) 1000 UNITS tablet Take 1,000 Units by mouth daily.        Marland Kitchen donepezil (ARICEPT) 10 MG tablet TAKE 1 TABLET DAILY  90 tablet  2  . folic acid (FOLVITE) 800 MCG tablet Take 400 mcg by mouth daily.        Marland Kitchen LEVOXYL 100 MCG tablet TAKE 1 TABLET DAILY  90 tablet  2  . metoprolol (TOPROL-XL) 50 MG 24 hr tablet TAKE ONE-HALF (1/2) TABLET TWICE A DAY  90 tablet  3  . Misc Natural Products (OSTEO BI-FLEX ADV DOUBLE ST) TABS Take by mouth daily.        . nitroGLYCERIN (NITROSTAT) 0.4 MG SL tablet Place 0.4 mg under the tongue every 5 (five) minutes as needed. For up to 3 doses.        Marland Kitchen triamcinolone (NASACORT AQ) 55 MCG/ACT nasal inhaler Place into the nose daily.           Past Medical History  Diagnosis Date  . Hypertension   . Thyroid disease   . CAD (coronary artery disease)     Dr. Ladona Ridgel  . Osteoarthritis   . BPH (benign prostatic hypertrophy)     Dr. Vonita Moss  . Osteoporosis     (took actonel x 5y)  . Renal insufficiency     Dr. Merrily Brittle  . Low back pain 2009  . Hip fx     x 2 on R.  . Sternum fx 2011  . Abnormal laboratory test 2011    elevated alk p  . Bruises easily   . Incontinence of urine   . Hearing loss     ROS:   All systems reviewed and negative except as noted in the  HPI.   Past Surgical History  Procedure Date  . Heart disease     permanent pacemaker  . Appendectomy   . Inguinal hernia repair   . Hip fracture surgery 2011    ORIF  R.  . Pacemaker insertion   . Colonoscopy 12/1998, 02/2004    diverticulosois, external hemorrhoids     Family History  Problem Relation Age of Onset  . Coronary artery disease      male 1st degree relative<60  . Hypertension    . Cancer Sister      History   Social History  . Marital Status: Married    Spouse Name: N/A    Number of Children: 2  . Years of Education: N/A   Occupational History  . retired    Social History Main Topics  . Smoking status: Former Games developer  . Smokeless tobacco: Not on file  . Alcohol Use: No  . Drug Use: No  . Sexually Active: Not Currently   Other Topics  Concern  . Not on file   Social History Narrative   Regular exercise--no.     BP 116/76  Pulse 87  Ht 5\' 8"  (1.727 m)  Wt 147 lb 12.8 oz (67.042 kg)  BMI 22.47 kg/m2  Physical Exam:  Well appearing only meds, NAD HEENT: Unremarkable Neck:  No JVD, no thyromegally Lymphatics:  No adenopathy Back:  No CVA tenderness Lungs:  Clear no wheezes, rales, or rhonchi. Well-healed ICD incision. HEART:  Regular rate rhythm, no murmurs, no rubs, no clicks Abd:  soft, positive bowel sounds, no organomegally, no rebound, no guarding Ext:  2 plus pulses, no edema, no cyanosis, no clubbing Skin:  No rashes no nodules Neuro:  CN II through XII intact, motor grossly intact  DEVICE  Normal device function.  See PaceArt for details.   Assess/Plan:

## 2011-01-06 ENCOUNTER — Other Ambulatory Visit: Payer: Self-pay | Admitting: Internal Medicine

## 2011-01-07 ENCOUNTER — Ambulatory Visit (INDEPENDENT_AMBULATORY_CARE_PROVIDER_SITE_OTHER): Payer: Medicare Other | Admitting: Internal Medicine

## 2011-01-07 VITALS — BP 110/62 | HR 80 | Ht 68.0 in | Wt 149.0 lb

## 2011-01-07 DIAGNOSIS — K624 Stenosis of anus and rectum: Secondary | ICD-10-CM

## 2011-01-07 MED ORDER — AMBULATORY NON FORMULARY MEDICATION: Status: DC

## 2011-01-07 NOTE — Patient Instructions (Signed)
Your prescription(s) has(have) been sent to your pharmacy for you to pick up [facility]- Diltiazem gel).

## 2011-01-07 NOTE — Progress Notes (Signed)
  Subjective:    Patient ID: [name] [name], male    DOB: 04-Dec-1922, 77 y.o.   MRN: [id number]  HPI elderly man with anal stenosis seen in the last 6-8 weeks. Diltiazem gel was prescribed. He thinks the diltiazem gel helped. No significant straning with defecation.   Review of Systems As above.    Objective:   Physical Exam Elderly white man no acute distress       Assessment & Plan:

## 2011-01-07 NOTE — Assessment & Plan Note (Signed)
Will continue with diltiazem gel 2%, he can use that as needed. See me on an as-needed basis.

## 2011-01-18 ENCOUNTER — Telehealth: Payer: Self-pay | Admitting: Internal Medicine

## 2011-01-18 MED ORDER — NITROGLYCERIN 0.4 MG SL SUBL: 0.4000 mg | SUBLINGUAL_TABLET | SUBLINGUAL | Status: DC | PRN

## 2011-01-18 NOTE — Telephone Encounter (Signed)
Pt wants refill of nitroglycerin pills called to [facility] please call when done

## 2011-02-28 ENCOUNTER — Other Ambulatory Visit: Payer: Self-pay | Admitting: *Deleted

## 2011-02-28 MED ORDER — ATORVASTATIN CALCIUM 20 MG PO TABS: 10.0000 mg | ORAL_TABLET | Freq: Two times a day (BID) | ORAL | Status: DC

## 2011-02-28 MED ORDER — DONEPEZIL HCL 10 MG PO TABS: 10.0000 mg | ORAL_TABLET | Freq: Every day | ORAL | Status: DC

## 2011-03-27 ENCOUNTER — Encounter: Payer: Self-pay | Admitting: Internal Medicine

## 2011-03-27 ENCOUNTER — Other Ambulatory Visit: Payer: Self-pay | Admitting: Internal Medicine

## 2011-03-27 ENCOUNTER — Ambulatory Visit (INDEPENDENT_AMBULATORY_CARE_PROVIDER_SITE_OTHER): Payer: Medicare Other | Admitting: *Deleted

## 2011-03-27 DIAGNOSIS — Z95810 Presence of automatic (implantable) cardiac defibrillator: Secondary | ICD-10-CM | POA: Diagnosis not present

## 2011-03-27 DIAGNOSIS — I5022 Chronic systolic (congestive) heart failure: Secondary | ICD-10-CM

## 2011-03-27 DIAGNOSIS — I2589 Other forms of chronic ischemic heart disease: Secondary | ICD-10-CM

## 2011-03-27 DIAGNOSIS — I495 Sick sinus syndrome: Secondary | ICD-10-CM

## 2011-03-27 LAB — REMOTE ICD DEVICE
AL IMPEDENCE ICD: 494 Ohm
ATRIAL PACING ICD: 98.31 pct
BATTERY VOLTAGE: 3.1274 V
CHARGE TIME: 8.868 s
DEV-0020ICD: NEGATIVE
PACEART VT: 0
TOT-0001: 1
TOT-0002: 0
TOT-0006: 20110602000000
TZAT-0001ATACH: 3
TZAT-0001FASTVT: 1
TZAT-0001SLOWVT: 1
TZAT-0001SLOWVT: 2
TZAT-0002ATACH: NEGATIVE
TZAT-0002FASTVT: NEGATIVE
TZAT-0004SLOWVT: 8
TZAT-0012ATACH: 150 ms
TZAT-0012ATACH: 150 ms
TZAT-0012ATACH: 150 ms
TZAT-0012FASTVT: 200 ms
TZAT-0012SLOWVT: 200 ms
TZAT-0012SLOWVT: 200 ms
TZAT-0013SLOWVT: 2
TZAT-0018ATACH: NEGATIVE
TZAT-0018FASTVT: NEGATIVE
TZAT-0019ATACH: 6 V
TZAT-0019SLOWVT: 8 V
TZAT-0019SLOWVT: 8 V
TZAT-0020ATACH: 1.5 ms
TZAT-0020ATACH: 1.5 ms
TZAT-0020SLOWVT: 1.5 ms
TZAT-0020SLOWVT: 1.5 ms
TZON-0003SLOWVT: 380 ms
TZON-0003VSLOWVT: 410 ms
TZST-0001ATACH: 4
TZST-0001ATACH: 6
TZST-0001FASTVT: 2
TZST-0001FASTVT: 4
TZST-0001FASTVT: 5
TZST-0001FASTVT: 6
TZST-0001SLOWVT: 3
TZST-0001SLOWVT: 5
TZST-0002ATACH: NEGATIVE
TZST-0002FASTVT: NEGATIVE
TZST-0002FASTVT: NEGATIVE
TZST-0002FASTVT: NEGATIVE
TZST-0003SLOWVT: 25 J
TZST-0003SLOWVT: 35 J
TZST-0003SLOWVT: 35 J
VENTRICULAR PACING ICD: 0 pct

## 2011-03-28 ENCOUNTER — Encounter: Payer: Medicare Other | Admitting: *Deleted

## 2011-03-28 NOTE — Progress Notes (Signed)
Remote defib check  

## 2011-04-10 ENCOUNTER — Encounter: Payer: Self-pay | Admitting: *Deleted

## 2011-04-16 ENCOUNTER — Encounter: Payer: Self-pay | Admitting: Endocrinology

## 2011-04-16 ENCOUNTER — Ambulatory Visit (INDEPENDENT_AMBULATORY_CARE_PROVIDER_SITE_OTHER): Payer: Medicare Other | Admitting: Endocrinology

## 2011-04-16 VITALS — BP 132/88 | HR 80 | Temp 97.6°F | Ht 68.0 in | Wt 158.2 lb

## 2011-04-16 DIAGNOSIS — T148XXA Other injury of unspecified body region, initial encounter: Secondary | ICD-10-CM | POA: Diagnosis not present

## 2011-04-16 DIAGNOSIS — W540XXA Bitten by dog, initial encounter: Secondary | ICD-10-CM

## 2011-04-16 NOTE — Progress Notes (Signed)
Subjective:    Patient ID: [name] [name], male    DOB: Dec 14, 1922, 78 y.o.   MRN: [id number]  HPI 4 days ago, pt was bitten by a dog on both hands.  Since then, he has slight pain at both hands, but no assoc fever.  The animal was captured by animal control,and it is in [place].  Past Medical History  Diagnosis Date  . Hypertension   . Thyroid disease   . CAD (coronary artery disease)     Dr. Ladona Ridgel  . Osteoarthritis   . BPH (benign prostatic hypertrophy)     Dr. Vonita Moss  . Osteoporosis     (took actonel x 5y)  . Renal insufficiency     Dr. Merrily Brittle  . Hip fx     x 2 on R.  . Sternum fx 2011  . Urinary incontinence   . Hearing loss     Past Surgical History  Procedure Date  . Heart disease     permanent pacemaker  . Appendectomy   . Inguinal hernia repair   . Hip fracture surgery 2011    ORIF  R.  . Pacemaker insertion   . Colonoscopy 12/1998, 02/2004    diverticulosois, external hemorrhoids    History   Social History  . Marital Status: Married    Spouse Name: N/A    Number of Children: 2  . Years of Education: N/A   Occupational History  . retired    Social History Main Topics  . Smoking status: Former Games developer  . Smokeless tobacco: Never Used  . Alcohol Use: No  . Drug Use: No  . Sexually Active: Not Currently   Other Topics Concern  . Not on file   Social History Narrative   Regular exercise--no.    Current Outpatient Prescriptions on File Prior to Visit  Medication Sig Dispense Refill  . AMBULATORY NON FORMULARY MEDICATION Diltiazem gel 2% Apply small amount pea-sized to anal area 2-3 times a day for anal stenosis. Use as needed.  30 g  3  . aspirin 81 MG EC tablet Take 81 mg by mouth daily.        Marland Kitchen atorvastatin (LIPITOR) 20 MG tablet Take 0.5 tablets (10 mg total) by mouth 2 (two) times daily.  30 tablet  5  . Cholecalciferol (VITAMIN D3) 1000 UNITS tablet Take 1,000 Units by mouth daily.        . Diltiazem HCl POWD Apply a small  pea-sized amount to anal area 2-3 times daily as needed.  25 g  3  . folic acid (FOLVITE) 800 MCG tablet Take 400 mcg by mouth daily.        Marland Kitchen LEVOXYL 100 MCG tablet TAKE 1 TABLET DAILY  90 tablet  2  . metoprolol (TOPROL-XL) 50 MG 24 hr tablet TAKE ONE-HALF (1/2) TABLET TWICE A DAY  90 tablet  3  . Misc Natural Products (OSTEO BI-FLEX ADV DOUBLE ST) TABS Take by mouth daily.        . nitroGLYCERIN (NITROSTAT) 0.4 MG SL tablet Place 1 tablet (0.4 mg total) under the tongue every 5 (five) minutes as needed for chest pain.  100 tablet  3  . triamcinolone (NASACORT AQ) 55 MCG/ACT nasal inhaler Place into the nose daily.        Marland Kitchen DISCONTD: donepezil (ARICEPT) 10 MG tablet Take 1 tablet (10 mg total) by mouth at bedtime.  90 tablet  2    No Known Allergies  Family History  Problem Relation  Age of Onset  . Coronary artery disease      male 1st degree relative<60  . Hypertension    . Cancer Sister     BP 132/88  Pulse 80  Temp(Src) 97.6 F (36.4 C) (Oral)  Ht 5\' 8"  (1.727 m)  Wt 158 lb 4 oz (71.782 kg)  BMI 24.06 kg/m2  SpO2 98%    Review of Systems Bleeding is better.  Denies numbness.      Objective:   Physical Exam VITAL SIGNS:  See vs page GENERAL: no distress Left hand, dorsal aspect:  3 cm long skin avulsion Right little finger:  Flexor aspect, between ip joints, there is a 2 cm transverse laceration.  The finger is otherwise of normal color and temperature, but slightly swollen.   Neuro: sensation is intact to touch on the right little finger. No surrounding erythema or drainage from either laceration.       Assessment & Plan:  Dog bites, new.  He has high risk of infection

## 2011-04-16 NOTE — Patient Instructions (Signed)
Until the wounds are healed, keep then covered with antibiotic ointment and bandaids.   I hope you feel better soon.  If you don't feel better by next week, please call your doctor.

## 2011-04-17 ENCOUNTER — Other Ambulatory Visit: Payer: Self-pay | Admitting: Internal Medicine

## 2011-05-13 DIAGNOSIS — L570 Actinic keratosis: Secondary | ICD-10-CM | POA: Diagnosis not present

## 2011-05-15 ENCOUNTER — Ambulatory Visit (INDEPENDENT_AMBULATORY_CARE_PROVIDER_SITE_OTHER): Payer: Medicare Other | Admitting: Endocrinology

## 2011-05-15 ENCOUNTER — Encounter: Payer: Self-pay | Admitting: Endocrinology

## 2011-05-15 ENCOUNTER — Ambulatory Visit (INDEPENDENT_AMBULATORY_CARE_PROVIDER_SITE_OTHER)
Admission: RE | Admit: 2011-05-15 | Discharge: 2011-05-15 | Disposition: A | Discharge status: Home / Self Care | Payer: Medicare Other | Source: Ambulatory Visit | Attending: Endocrinology | Admitting: Endocrinology

## 2011-05-15 VITALS — BP 142/84 | HR 79 | Temp 96.5°F | Ht 68.0 in | Wt 156.4 lb

## 2011-05-15 DIAGNOSIS — R05 Cough: Secondary | ICD-10-CM

## 2011-05-15 DIAGNOSIS — R0789 Other chest pain: Secondary | ICD-10-CM | POA: Diagnosis not present

## 2011-05-15 MED ORDER — ATORVASTATIN CALCIUM 20 MG PO TABS: 10.0000 mg | ORAL_TABLET | Freq: Two times a day (BID) | ORAL | Status: DC

## 2011-05-15 MED ORDER — CEFUROXIME AXETIL 500 MG PO TABS: 500.0000 mg | ORAL_TABLET | Freq: Two times a day (BID) | ORAL | Status: DC

## 2011-05-15 NOTE — Patient Instructions (Addendum)
A chest-x-ray is requested for you today.  please call [phone] to hear your test results.  You will be prompted to enter the 9-digit "MRN" number that appears at the top left of this page, followed by #.  Then you will hear the message. i have sent a prescription to your pharmacy, for an antibiotic.   here is a sample of "advair-[id number]."  take 1 puff 2x a day.  rinse mouth after using. I hope you feel better soon.  If you don't feel better by next week, please call back (update: i left message on phone-tree:  rx as we discussed)

## 2011-05-15 NOTE — Progress Notes (Signed)
Subjective:    Patient ID: [name] [name], male    DOB: 1922-12-08, 78 y.o.   MRN: [id number]  HPI Pt states few mos of intermittent resp infections.  Most recently, he has 1 week of slight prod-quality cough in the chest, and assoc pain.  No sore throat or earache.   He fell on left chest wall 2 weeks ago.  It feels better now Past Medical History  Diagnosis Date  . Hypertension   . Thyroid disease   . CAD (coronary artery disease)     Dr. Ladona Ridgel  . Osteoarthritis   . BPH (benign prostatic hypertrophy)     Dr. Vonita Moss  . Osteoporosis     (took actonel x 5y)  . Renal insufficiency     Dr. Merrily Brittle  . Hip fx     x 2 on R.  . Sternum fx 2011  . Urinary incontinence   . Hearing loss     Past Surgical History  Procedure Date  . Heart disease     permanent pacemaker  . Appendectomy   . Inguinal hernia repair   . Hip fracture surgery 2011    ORIF  R.  . Pacemaker insertion   . Colonoscopy 12/1998, 02/2004    diverticulosois, external hemorrhoids    History   Social History  . Marital Status: Married    Spouse Name: N/A    Number of Children: 2  . Years of Education: N/A   Occupational History  . retired    Social History Main Topics  . Smoking status: Former Games developer  . Smokeless tobacco: Never Used  . Alcohol Use: No  . Drug Use: No  . Sexually Active: Not Currently   Other Topics Concern  . Not on file   Social History Narrative   Regular exercise--no.    Current Outpatient Prescriptions on File Prior to Visit  Medication Sig Dispense Refill  . AMBULATORY NON FORMULARY MEDICATION Diltiazem gel 2% Apply small amount pea-sized to anal area 2-3 times a day for anal stenosis. Use as needed.  30 g  3  . aspirin 81 MG EC tablet Take 81 mg by mouth daily.        . Cholecalciferol (VITAMIN D3) 1000 UNITS tablet Take 1,000 Units by mouth daily.        . Diltiazem HCl POWD Apply a small pea-sized amount to anal area 2-3 times daily as needed.  25 g  3  .  donepezil (ARICEPT) 10 MG tablet TAKE 1 TABLET DAILY  90 tablet  1  . folic acid (FOLVITE) 800 MCG tablet Take 400 mcg by mouth daily.        Marland Kitchen LEVOXYL 100 MCG tablet TAKE 1 TABLET DAILY  90 tablet  2  . metoprolol (TOPROL-XL) 50 MG 24 hr tablet TAKE ONE-HALF (1/2) TABLET TWICE A DAY  90 tablet  3  . Misc Natural Products (OSTEO BI-FLEX ADV DOUBLE ST) TABS Take by mouth daily.        . nitroGLYCERIN (NITROSTAT) 0.4 MG SL tablet Place 1 tablet (0.4 mg total) under the tongue every 5 (five) minutes as needed for chest pain.  100 tablet  3  . triamcinolone (NASACORT AQ) 55 MCG/ACT nasal inhaler Place into the nose daily.          No Known Allergies  Family History  Problem Relation Age of Onset  . Coronary artery disease      male 1st degree relative<60  . Hypertension    .  Cancer Sister     BP 142/84  Pulse 79  Temp(Src) 96.5 F (35.8 C) (Oral)  Ht 5\' 8"  (1.727 m)  Wt 156 lb 6.4 oz (70.943 kg)  BMI 23.78 kg/m2  SpO2 96%   Review of Systems He has chills.  He thinks he has been having fever, also.  Denies sob.      Objective:   Physical Exam VITAL SIGNS:  See vs page GENERAL: no distress LUNGS:  Clear to auscultation, except rales at the right base.      Cxr: nad    Assessment & Plan:  Acute bronchitis, new Chest-wall contusion, new HTN, with situational component

## 2011-05-23 ENCOUNTER — Encounter: Payer: Self-pay | Admitting: Internal Medicine

## 2011-05-23 ENCOUNTER — Ambulatory Visit (INDEPENDENT_AMBULATORY_CARE_PROVIDER_SITE_OTHER): Payer: Medicare Other | Admitting: Internal Medicine

## 2011-05-23 DIAGNOSIS — R279 Unspecified lack of coordination: Secondary | ICD-10-CM

## 2011-05-23 DIAGNOSIS — I5022 Chronic systolic (congestive) heart failure: Secondary | ICD-10-CM | POA: Diagnosis not present

## 2011-05-23 DIAGNOSIS — J069 Acute upper respiratory infection, unspecified: Secondary | ICD-10-CM

## 2011-05-23 DIAGNOSIS — F4321 Adjustment disorder with depressed mood: Secondary | ICD-10-CM

## 2011-05-23 DIAGNOSIS — N259 Disorder resulting from impaired renal tubular function, unspecified: Secondary | ICD-10-CM

## 2011-05-23 DIAGNOSIS — I10 Essential (primary) hypertension: Secondary | ICD-10-CM

## 2011-05-23 DIAGNOSIS — I2510 Atherosclerotic heart disease of native coronary artery without angina pectoris: Secondary | ICD-10-CM

## 2011-05-23 DIAGNOSIS — E039 Hypothyroidism, unspecified: Secondary | ICD-10-CM

## 2011-05-23 DIAGNOSIS — M810 Age-related osteoporosis without current pathological fracture: Secondary | ICD-10-CM

## 2011-05-23 MED ORDER — TRIAMCINOLONE ACETONIDE(NASAL) 55 MCG/ACT NA INHA: 1.0000 | Freq: Every day | NASAL | Status: DC

## 2011-05-23 NOTE — Assessment & Plan Note (Signed)
Continue with current prescription therapy as reflected on the Med list.  

## 2011-05-23 NOTE — Progress Notes (Signed)
Patient ID: [name] [name], male   DOB: [birth year], 78 y.o.   MRN: [id number]  Subjective:    Patient ID: [name] [name], male    DOB: [birth year], 78 y.o.   MRN: [id number]  Cough Pertinent negatives include no chest pain, rash or sore throat.    The patient presents for a follow-up of  Chronic anal stenosis, hypertension, chronic dyslipidemia, CAD controlled with medicines  He lost his wife in Dec 2012  C/o URI sx's x 2 d    Review of Systems  Constitutional: Negative for appetite change, fatigue and unexpected weight change.  HENT: Negative for nosebleeds, congestion, sore throat, sneezing, trouble swallowing and neck pain.   Eyes: Negative for itching and visual disturbance.  Respiratory: Positive for cough.   Cardiovascular: Negative for chest pain, palpitations and leg swelling.  Gastrointestinal: Negative for nausea, vomiting, abdominal pain, diarrhea, constipation, blood in stool, abdominal distention and rectal pain.  Genitourinary: Negative for frequency and hematuria.  Musculoskeletal: Negative for back pain, joint swelling and gait problem.  Skin: Negative for rash.  Neurological: Negative for dizziness, tremors, speech difficulty and weakness.  Psychiatric/Behavioral: Negative for sleep disturbance, dysphoric mood and agitation. The patient is not nervous/anxious.        Objective:   Physical Exam  Constitutional: He is oriented to person, place, and time. He appears well-developed and well-nourished.  HENT:  Mouth/Throat: Oropharynx is clear and moist.  Eyes: Conjunctivae are normal. Pupils are equal, round, and reactive to light.  Neck: Normal range of motion. No JVD present. No thyromegaly present.  Cardiovascular: Normal rate, regular rhythm, normal heart sounds and intact distal pulses.  Exam reveals no gallop and no friction rub.   No murmur heard. Pulmonary/Chest: Effort normal and breath sounds normal. No respiratory distress. He has no wheezes. He has no  rales. He exhibits no tenderness.  Abdominal: Soft. Bowel sounds are normal. He exhibits no distension and no mass. There is no tenderness. There is no rebound and no guarding.  Musculoskeletal: Normal range of motion. He exhibits no edema and no tenderness.  Lymphadenopathy:    He has no cervical adenopathy.  Neurological: He is alert and oriented to person, place, and time. He has normal reflexes. No cranial nerve deficit. He exhibits normal muscle tone. Coordination normal.  Skin: Skin is warm and dry. No rash noted.  Psychiatric: He has a normal mood and affect. His behavior is normal. Judgment and thought content normal.          Assessment & Plan:

## 2011-05-23 NOTE — Assessment & Plan Note (Signed)
2/13 viral

## 2011-05-23 NOTE — Assessment & Plan Note (Signed)
Vit D 

## 2011-05-23 NOTE — Assessment & Plan Note (Signed)
Discussed.

## 2011-06-13 ENCOUNTER — Ambulatory Visit (INDEPENDENT_AMBULATORY_CARE_PROVIDER_SITE_OTHER): Payer: Medicare Other | Admitting: Internal Medicine

## 2011-06-13 ENCOUNTER — Encounter: Payer: Self-pay | Admitting: Internal Medicine

## 2011-06-13 VITALS — BP 110/82 | HR 85 | Temp 97.7°F | Wt 157.2 lb

## 2011-06-13 DIAGNOSIS — N259 Disorder resulting from impaired renal tubular function, unspecified: Secondary | ICD-10-CM

## 2011-06-13 DIAGNOSIS — R091 Pleurisy: Secondary | ICD-10-CM

## 2011-06-13 DIAGNOSIS — R05 Cough: Secondary | ICD-10-CM

## 2011-06-13 MED ORDER — HYDROCODONE-HOMATROPINE 5-1.5 MG/5ML PO SYRP: 2.5000 mL | ORAL_SOLUTION | Freq: Three times a day (TID) | ORAL | Status: DC | PRN

## 2011-06-13 MED ORDER — DOXYCYCLINE HYCLATE 100 MG PO TABS: 100.0000 mg | ORAL_TABLET | Freq: Two times a day (BID) | ORAL | Status: AC

## 2011-06-13 NOTE — Patient Instructions (Signed)
It was good to see you today. Doxycycline antibiotics and hydrocodone syrup for cough and pain symptoms - Your prescription(s) have been submitted to your pharmacy. Please take as directed and contact our office if you believe you are having problem(s) with the medication(s).  We reviewed your 04/2011 chest x ray - no fluid or infection seen If symptoms worse, or if unimproved in next 10 days, call for reevaluation as needed

## 2011-06-13 NOTE — Progress Notes (Signed)
  Subjective:    Patient ID: [name] [name], male    DOB: 1923/01/28, 78 y.o.   MRN: [id number]  HPI complains of cough - scant yellow green sputum Onset 1 week ago associated with mild R anterior pleurisy No fever, sore throat, sinus pressure or shortness of breath/dyspnea on exertion  Hx same last month, recurrent symptoms        Past Medical History  Diagnosis Date  . Hypertension   . Hypothyroid   . CAD (coronary artery disease)     Dr. Ladona Ridgel  . Osteoarthritis   . BPH (benign prostatic hypertrophy)     Dr. Vonita Moss  . Osteoporosis     (took actonel x 5y)  . Renal insufficiency     Dr. Merrily Brittle  . Hip fx     x 2 on R.  . Sternum fx 2011  . Urinary incontinence   . Hearing loss     Review of Systems  Constitutional: Negative for fever, chills and fatigue.  Respiratory: Positive for cough. Negative for shortness of breath and wheezing.   Cardiovascular: Negative for palpitations and leg swelling.       Objective:   Physical Exam BP 110/82  Pulse 85  Temp(Src) 97.7 F (36.5 C) (Oral)  Wt 157 lb 3.2 oz (71.305 kg)  SpO2 94% Wt Readings from Last 3 Encounters:  06/13/11 157 lb 3.2 oz (71.305 kg)  05/23/11 156 lb (70.761 kg)  05/15/11 156 lb 6.4 oz (70.943 kg)   Constitutional:  He appears well-developed and well-nourished. No distress.  Neck: Normal range of motion. Neck supple. No JVD present. No thyromegaly present.  Cardiovascular: Normal rate, regular rhythm and normal heart sounds.  No murmur heard. no BLE edema Pulmonary/Chest: Effort normal and breath sounds normal. No respiratory distress. no wheezes. Skin: Skin is warm and dry.  No erythema or ulceration.  Psychiatric: he has a normal mood and affect. behavior is normal. Judgment and thought content normal.   Lab Results  Component Value Date   WBC 9.7 01/12/2010   HGB 13.7 01/12/2010   HCT 40.1 01/12/2010   PLT 155.0 01/12/2010   GLUCOSE 91 09/12/2010   CHOL 112 01/12/2010   TRIG 94.0  01/12/2010   HDL 38.20* 01/12/2010   LDLCALC 55 01/12/2010   ALT 26 09/12/2010   AST 24 09/12/2010   NA 135 09/12/2010   K 4.6 09/12/2010   CL 101 09/12/2010   CREATININE 1.7* 09/12/2010   BUN 36* 09/12/2010   CO2 26 09/12/2010   TSH 0.44 09/12/2010   PSA 3.67 01/12/2010   INR 1.1 ratio* 08/17/2009   Dg Chest 2 View  05/15/2011  *RADIOLOGY REPORT*  Clinical Data: Cough, chest pressure  CHEST - 2 VIEW  Comparison: 08/24/2009  Findings: Cardiomediastinal silhouette is stable.  No acute infiltrate or pleural effusion.  No pulmonary edema.  Dual lead cardiac pacemaker is unchanged in position.  Stable calcified granulomas right base.  Stable mild compression deformity lower thoracic spine.  IMPRESSION: No active disease.  No significant change.  Original Report Authenticated By: Natasha Mead, M.D.       Assessment & Plan:   Cough, mild with pleurisy - unable to tolerate NSAIDs due to CKD Will treat with hydrocodone syrup to suppress cough and help pain -  \also empiric doxy given sputum - erx done No evidence of volume overload on exam - no leg swelling or PND Recent CXR reviewed F/u PCP if persisting symptoms or if worse

## 2011-06-27 ENCOUNTER — Other Ambulatory Visit: Payer: Self-pay | Admitting: Internal Medicine

## 2011-06-27 ENCOUNTER — Encounter: Payer: Medicare Other | Admitting: *Deleted

## 2011-07-01 ENCOUNTER — Ambulatory Visit (INDEPENDENT_AMBULATORY_CARE_PROVIDER_SITE_OTHER): Payer: Medicare Other | Admitting: Endocrinology

## 2011-07-01 ENCOUNTER — Other Ambulatory Visit: Payer: Self-pay | Admitting: *Deleted

## 2011-07-01 ENCOUNTER — Encounter: Payer: Self-pay | Admitting: Endocrinology

## 2011-07-01 ENCOUNTER — Ambulatory Visit (INDEPENDENT_AMBULATORY_CARE_PROVIDER_SITE_OTHER)
Admission: RE | Admit: 2011-07-01 | Discharge: 2011-07-01 | Disposition: A | Discharge status: Home / Self Care | Payer: Medicare Other | Source: Ambulatory Visit | Attending: Endocrinology | Admitting: Endocrinology

## 2011-07-01 ENCOUNTER — Ambulatory Visit: Payer: Medicare Other

## 2011-07-01 VITALS — BP 112/72 | HR 75 | Temp 96.3°F | Ht 68.0 in | Wt 157.6 lb

## 2011-07-01 DIAGNOSIS — M545 Low back pain: Secondary | ICD-10-CM | POA: Diagnosis not present

## 2011-07-01 DIAGNOSIS — M8448XA Pathological fracture, other site, initial encounter for fracture: Secondary | ICD-10-CM | POA: Diagnosis not present

## 2011-07-01 DIAGNOSIS — M5137 Other intervertebral disc degeneration, lumbosacral region: Secondary | ICD-10-CM | POA: Diagnosis not present

## 2011-07-01 DIAGNOSIS — M47817 Spondylosis without myelopathy or radiculopathy, lumbosacral region: Secondary | ICD-10-CM | POA: Diagnosis not present

## 2011-07-01 LAB — URINALYSIS, ROUTINE W REFLEX MICROSCOPIC
Ketones, ur: NEGATIVE
Leukocytes, UA: NEGATIVE
Specific Gravity, Urine: <=1.005 (ref 1.000–1.030)
Urine Glucose: NEGATIVE
Urobilinogen, UA: 0.2 (ref 0.0–1.0)
pH: 5.5 (ref 5.0–8.0)

## 2011-07-01 MED ORDER — TRAMADOL-ACETAMINOPHEN 37.5-325 MG PO TABS: 1.0000 | ORAL_TABLET | Freq: Four times a day (QID) | ORAL | Status: DC | PRN

## 2011-07-01 NOTE — Progress Notes (Signed)
Subjective:    Patient ID: [name] [name], male    DOB: [birth year], 78 y.o.   MRN: [id number]  HPI Pt states 1 day of moderate pain at the right lower back, but no assoc gross hematuria.  He thinks he passed a urinary stone 30 years ago, but he is not sure.   Past Medical History  Diagnosis Date  . Hypertension   . Hypothyroid   . CAD (coronary artery disease)     Dr. Ladona Ridgel  . Osteoarthritis   . BPH (benign prostatic hypertrophy)     Dr. Vonita Moss  . Osteoporosis     (took actonel x 5y)  . Renal insufficiency     Dr. Merrily Brittle  . Hip fx     x 2 on R.  . Sternum fx 2011  . Urinary incontinence   . Hearing loss     Past Surgical History  Procedure Date  . Heart disease     permanent pacemaker  . Appendectomy   . Inguinal hernia repair   . Hip fracture surgery 2011    ORIF  R.  . Pacemaker insertion   . Colonoscopy 12/1998, 02/2004    diverticulosois, external hemorrhoids    History   Social History  . Marital Status: Married    Spouse Name: N/A    Number of Children: 2  . Years of Education: N/A   Occupational History  . retired    Social History Main Topics  . Smoking status: Former Games developer  . Smokeless tobacco: Never Used  . Alcohol Use: No  . Drug Use: No  . Sexually Active: Not Currently   Other Topics Concern  . Not on file   Social History Narrative   Regular exercise--no.    Current Outpatient Prescriptions on File Prior to Visit  Medication Sig Dispense Refill  . AMBULATORY NON FORMULARY MEDICATION Diltiazem gel 2% Apply small amount pea-sized to anal area 2-3 times a day for anal stenosis. Use as needed.  30 g  3  . aspirin 81 MG EC tablet Take 81 mg by mouth daily.        Marland Kitchen atorvastatin (LIPITOR) 20 MG tablet Take 0.5 tablets (10 mg total) by mouth 2 (two) times daily.  90 tablet  3  . Cholecalciferol (VITAMIN D3) 1000 UNITS tablet Take 1,000 Units by mouth daily.        . Diltiazem HCl POWD Apply a small pea-sized amount to anal area  2-3 times daily as needed.  25 g  3  . donepezil (ARICEPT) 10 MG tablet TAKE 1 TABLET DAILY  90 tablet  1  . folic acid (FOLVITE) 800 MCG tablet Take 400 mcg by mouth daily.        Marland Kitchen LEVOXYL 100 MCG tablet TAKE 1 TABLET DAILY  90 tablet  3  . metoprolol (TOPROL-XL) 50 MG 24 hr tablet TAKE ONE-HALF (1/2) TABLET TWICE A DAY  90 tablet  3  . Misc Natural Products (OSTEO BI-FLEX ADV DOUBLE ST) TABS Take by mouth daily.        . nitroGLYCERIN (NITROSTAT) 0.4 MG SL tablet Place 1 tablet (0.4 mg total) under the tongue every 5 (five) minutes as needed for chest pain.  100 tablet  3  . triamcinolone (NASACORT AQ) 55 MCG/ACT nasal inhaler Place 1 spray into the nose daily.  3 Inhaler  3    No Known Allergies  Family History  Problem Relation Age of Onset  . Coronary artery disease  male 1st degree relative<60  . Hypertension    . Cancer Sister     BP 112/72  Pulse 75  Temp(Src) 96.3 F (35.7 C) (Oral)  Ht 5\' 8"  (1.727 m)  Wt 157 lb 9.6 oz (71.487 kg)  BMI 23.96 kg/m2  SpO2 95%    Review of Systems Denies numbness and bowel/bladder retention.      Objective:   Physical Exam VITAL SIGNS:  See vs page GENERAL: no distress Spine: nontender Neuro: sensation is intact to touch on the LE's. Gait: normal.    (i reviewed ua result)    Assessment & Plan:  Low-back pain, prob due to strain, new

## 2011-07-01 NOTE — Patient Instructions (Addendum)
X-rays and a urine test are being requested for you today.  You will receive a letter with results. i have sent a prescription to your pharmacy, for a pain medication. I hope you feel better soon.  If you don't feel better by next week, please call back.

## 2011-07-02 ENCOUNTER — Telehealth: Payer: Self-pay | Admitting: *Deleted

## 2011-07-02 NOTE — Telephone Encounter (Signed)
Called pt to inform of chest xray and UA results, no answer/unable to leave message. (Letter also mailed to pt)

## 2011-07-03 NOTE — Telephone Encounter (Signed)
Pt informed of results.

## 2011-07-04 ENCOUNTER — Encounter: Payer: Self-pay | Admitting: *Deleted

## 2011-07-09 ENCOUNTER — Encounter: Payer: Self-pay | Admitting: Internal Medicine

## 2011-07-09 ENCOUNTER — Ambulatory Visit (INDEPENDENT_AMBULATORY_CARE_PROVIDER_SITE_OTHER): Payer: Medicare Other | Admitting: Internal Medicine

## 2011-07-09 VITALS — BP 128/90 | HR 80 | Temp 96.8°F | Resp 16 | Wt 152.0 lb

## 2011-07-09 DIAGNOSIS — I10 Essential (primary) hypertension: Secondary | ICD-10-CM | POA: Diagnosis not present

## 2011-07-09 DIAGNOSIS — M546 Pain in thoracic spine: Secondary | ICD-10-CM

## 2011-07-09 DIAGNOSIS — I2510 Atherosclerotic heart disease of native coronary artery without angina pectoris: Secondary | ICD-10-CM

## 2011-07-09 DIAGNOSIS — S22009A Unspecified fracture of unspecified thoracic vertebra, initial encounter for closed fracture: Secondary | ICD-10-CM

## 2011-07-09 DIAGNOSIS — S22080A Wedge compression fracture of T11-T12 vertebra, initial encounter for closed fracture: Secondary | ICD-10-CM

## 2011-07-09 DIAGNOSIS — E039 Hypothyroidism, unspecified: Secondary | ICD-10-CM

## 2011-07-09 MED ORDER — TRAMADOL-ACETAMINOPHEN 37.5-325 MG PO TABS: 1.0000 | ORAL_TABLET | Freq: Four times a day (QID) | ORAL | Status: AC | PRN

## 2011-07-09 NOTE — Assessment & Plan Note (Signed)
Continue with current prescription therapy as reflected on the Med list.  

## 2011-07-09 NOTE — Assessment & Plan Note (Signed)
4/13/

## 2011-07-09 NOTE — Assessment & Plan Note (Signed)
07/01/11 xray: Comparison: CT myelogram 05/22/2007.  Findings: Degenerative disc disease throughout the lumbar spine,  most pronounced at L1-2 and T12-L1. Mild compression and anterior  wedging noted at T12. This area was not imaged on the prior CT  myelogram, and therefore is age indeterminate. Degenerative facet  disease in the lower lumbar spine.  IMPRESSION: Degenerative changes as above.  Age indeterminate mild compression fracture at T12

## 2011-07-09 NOTE — Progress Notes (Signed)
Patient ID: [name] [name], male   DOB: September 11, 1922, 78 y.o.   MRN: [id number]  Subjective:    Patient ID: [name] [name], male    DOB: 08/11/1922, 78 y.o.   MRN: [id number]  Back Pain Pertinent negatives include no abdominal pain, chest pain or weakness.   C/o lower thor pain x 1 wk  The patient presents for a follow-up of  Chronic anal stenosis, hypertension, chronic dyslipidemia, CAD controlled with medicines  Wt Readings from Last 3 Encounters:  07/09/11 152 lb (68.947 kg)  07/01/11 157 lb 9.6 oz (71.487 kg)  06/13/11 157 lb 3.2 oz (71.305 kg)   BP Readings from Last 3 Encounters:  07/09/11 128/90  07/01/11 112/72  06/13/11 110/82      Review of Systems  Constitutional: Negative for appetite change, fatigue and unexpected weight change.  HENT: Negative for nosebleeds, congestion, sore throat, sneezing, trouble swallowing and neck pain.   Eyes: Negative for itching and visual disturbance.  Respiratory: Negative for cough.   Cardiovascular: Negative for chest pain, palpitations and leg swelling.  Gastrointestinal: Negative for nausea, vomiting, abdominal pain, diarrhea, constipation, blood in stool, abdominal distention and rectal pain.  Genitourinary: Negative for frequency and hematuria.  Musculoskeletal: Positive for back pain. Negative for joint swelling and gait problem.  Skin: Negative for rash.  Neurological: Negative for dizziness, tremors, speech difficulty and weakness.  Psychiatric/Behavioral: Negative for sleep disturbance, dysphoric mood and agitation. The patient is not nervous/anxious.        Objective:   Physical Exam  Constitutional: He is oriented to person, place, and time. He appears well-developed and well-nourished.  HENT:  Mouth/Throat: Oropharynx is clear and moist.  Eyes: Conjunctivae are normal. Pupils are equal, round, and reactive to light.  Neck: Normal range of motion. No JVD present. No thyromegaly present.  Cardiovascular: Normal rate,  regular rhythm, normal heart sounds and intact distal pulses.  Exam reveals no gallop and no friction rub.   No murmur heard. Pulmonary/Chest: Effort normal and breath sounds normal. No respiratory distress. He has no wheezes. He has no rales. He exhibits no tenderness.  Abdominal: Soft. Bowel sounds are normal. He exhibits no distension and no mass. There is no tenderness. There is no rebound and no guarding.  Musculoskeletal: Normal range of motion. He exhibits tenderness (T12 is tender). He exhibits no edema.  Lymphadenopathy:    He has no cervical adenopathy.  Neurological: He is alert and oriented to person, place, and time. He has normal reflexes. No cranial nerve deficit. He exhibits normal muscle tone. Coordination normal.  Skin: Skin is warm and dry. No rash noted.  Psychiatric: He has a normal mood and affect. His behavior is normal. Judgment and thought content normal.   Lab Results  Component Value Date   WBC 9.7 01/12/2010   HGB 13.7 01/12/2010   HCT 40.1 01/12/2010   PLT 155.0 01/12/2010   GLUCOSE 91 09/12/2010   CHOL 112 01/12/2010   TRIG 94.0 01/12/2010   HDL 38.20* 01/12/2010   LDLCALC 55 01/12/2010   ALT 26 09/12/2010   AST 24 09/12/2010   NA 135 09/12/2010   K 4.6 09/12/2010   CL 101 09/12/2010   CREATININE 1.7* 09/12/2010   BUN 36* 09/12/2010   CO2 26 09/12/2010   TSH 0.44 09/12/2010   PSA 3.67 01/12/2010   INR 1.1 ratio* 08/17/2009          Assessment & Plan:

## 2011-07-16 ENCOUNTER — Ambulatory Visit (INDEPENDENT_AMBULATORY_CARE_PROVIDER_SITE_OTHER): Payer: Medicare Other | Admitting: *Deleted

## 2011-07-16 ENCOUNTER — Encounter: Payer: Self-pay | Admitting: Internal Medicine

## 2011-07-16 DIAGNOSIS — I5022 Chronic systolic (congestive) heart failure: Secondary | ICD-10-CM | POA: Diagnosis not present

## 2011-07-16 DIAGNOSIS — I495 Sick sinus syndrome: Secondary | ICD-10-CM

## 2011-07-19 LAB — REMOTE ICD DEVICE
AL AMPLITUDE: 2.1 mv
BAMS-0001: 170 {beats}/min
BATTERY VOLTAGE: 3.1142 V
CHARGE TIME: 8.868 s
RV LEAD AMPLITUDE: 10.8 mv
RV LEAD IMPEDENCE ICD: 437 Ohm
TOT-0001: 1
TOT-0002: 0
TOT-0006: 20110602000000
TZAT-0002ATACH: NEGATIVE
TZAT-0004SLOWVT: 8
TZAT-0004SLOWVT: 8
TZAT-0011SLOWVT: 10 ms
TZAT-0011SLOWVT: 10 ms
TZAT-0012ATACH: 150 ms
TZAT-0012FASTVT: 200 ms
TZAT-0018FASTVT: NEGATIVE
TZAT-0019ATACH: 6 V
TZAT-0019ATACH: 6 V
TZAT-0019FASTVT: 8 V
TZAT-0019SLOWVT: 8 V
TZAT-0019SLOWVT: 8 V
TZAT-0020ATACH: 1.5 ms
TZAT-0020ATACH: 1.5 ms
TZAT-0020ATACH: 1.5 ms
TZAT-0020FASTVT: 1.5 ms
TZAT-0020SLOWVT: 1.5 ms
TZAT-0020SLOWVT: 1.5 ms
TZON-0003VSLOWVT: 410 ms
TZON-0004VSLOWVT: 32
TZST-0001FASTVT: 2
TZST-0001FASTVT: 5
TZST-0001FASTVT: 6
TZST-0001SLOWVT: 5
TZST-0002ATACH: NEGATIVE
TZST-0002FASTVT: NEGATIVE
TZST-0002FASTVT: NEGATIVE
TZST-0002FASTVT: NEGATIVE
TZST-0003SLOWVT: 25 J
TZST-0003SLOWVT: 35 J

## 2011-07-23 NOTE — Progress Notes (Signed)
Remote icd check w/icm  

## 2011-08-09 ENCOUNTER — Ambulatory Visit (INDEPENDENT_AMBULATORY_CARE_PROVIDER_SITE_OTHER): Payer: Medicare Other | Admitting: Internal Medicine

## 2011-08-09 ENCOUNTER — Encounter: Payer: Self-pay | Admitting: Internal Medicine

## 2011-08-09 ENCOUNTER — Encounter: Payer: Self-pay | Admitting: *Deleted

## 2011-08-09 VITALS — BP 112/78 | HR 76 | Temp 97.9°F | Resp 16 | Wt 154.0 lb

## 2011-08-09 DIAGNOSIS — F4321 Adjustment disorder with depressed mood: Secondary | ICD-10-CM | POA: Diagnosis not present

## 2011-08-09 DIAGNOSIS — S22080A Wedge compression fracture of T11-T12 vertebra, initial encounter for closed fracture: Secondary | ICD-10-CM

## 2011-08-09 DIAGNOSIS — S22009A Unspecified fracture of unspecified thoracic vertebra, initial encounter for closed fracture: Secondary | ICD-10-CM

## 2011-08-09 DIAGNOSIS — M546 Pain in thoracic spine: Secondary | ICD-10-CM

## 2011-08-09 DIAGNOSIS — M545 Low back pain: Secondary | ICD-10-CM | POA: Diagnosis not present

## 2011-08-09 DIAGNOSIS — E039 Hypothyroidism, unspecified: Secondary | ICD-10-CM

## 2011-08-09 MED ORDER — LEVOTHYROXINE SODIUM 100 MCG PO TABS: 100.0000 ug | ORAL_TABLET | Freq: Every day | ORAL | Status: DC

## 2011-08-09 NOTE — Assessment & Plan Note (Addendum)
Better Thor vert fx 4/13 -- better Declined further w/up and Rx 

## 2011-08-09 NOTE — Progress Notes (Signed)
   Subjective:    Patient ID: [name] [name], male    DOB: 28-Feb-1923, 78 y.o.   MRN: [id number]  Back Pain Pertinent negatives include no abdominal pain, chest pain or weakness.   C/o lower thor pain (vert fx) x 6 wks -- better  The patient presents for a follow-up of  Chronic anal stenosis, hypertension, chronic dyslipidemia, CAD controlled with medicines  Wt Readings from Last 3 Encounters:  08/09/11 154 lb (69.854 kg)  07/09/11 152 lb (68.947 kg)  07/01/11 157 lb 9.6 oz (71.487 kg)   BP Readings from Last 3 Encounters:  08/09/11 112/78  07/09/11 128/90  07/01/11 112/72      Review of Systems  Constitutional: Negative for appetite change, fatigue and unexpected weight change.  HENT: Negative for nosebleeds, congestion, sore throat, sneezing, trouble swallowing and neck pain.   Eyes: Negative for itching and visual disturbance.  Respiratory: Negative for cough.   Cardiovascular: Negative for chest pain, palpitations and leg swelling.  Gastrointestinal: Negative for nausea, vomiting, abdominal pain, diarrhea, constipation, blood in stool, abdominal distention and rectal pain.  Genitourinary: Negative for frequency and hematuria.  Musculoskeletal: Positive for back pain. Negative for joint swelling and gait problem.  Skin: Negative for rash.  Neurological: Negative for dizziness, tremors, speech difficulty and weakness.  Psychiatric/Behavioral: Negative for sleep disturbance, dysphoric mood and agitation. The patient is not nervous/anxious.        Objective:   Physical Exam  Constitutional: He is oriented to person, place, and time. He appears well-developed and well-nourished.  HENT:  Mouth/Throat: Oropharynx is clear and moist.  Eyes: Conjunctivae are normal. Pupils are equal, round, and reactive to light.  Neck: Normal range of motion. No JVD present. No thyromegaly present.  Cardiovascular: Normal rate, regular rhythm, normal heart sounds and intact distal pulses.   Exam reveals no gallop and no friction rub.   No murmur heard. Pulmonary/Chest: Effort normal and breath sounds normal. No respiratory distress. He has no wheezes. He has no rales. He exhibits no tenderness.  Abdominal: Soft. Bowel sounds are normal. He exhibits no distension and no mass. There is no tenderness. There is no rebound and no guarding.  Musculoskeletal: Normal range of motion. He exhibits tenderness (T12 is tender). He exhibits no edema.  Lymphadenopathy:    He has no cervical adenopathy.  Neurological: He is alert and oriented to person, place, and time. He has normal reflexes. No cranial nerve deficit. He exhibits normal muscle tone. Coordination normal.  Skin: Skin is warm and dry. No rash noted.  Psychiatric: He has a normal mood and affect. His behavior is normal. Judgment and thought content normal.   Lab Results  Component Value Date   WBC 9.7 01/12/2010   HGB 13.7 01/12/2010   HCT 40.1 01/12/2010   PLT 155.0 01/12/2010   GLUCOSE 91 09/12/2010   CHOL 112 01/12/2010   TRIG 94.0 01/12/2010   HDL 38.20* 01/12/2010   LDLCALC 55 01/12/2010   ALT 26 09/12/2010   AST 24 09/12/2010   NA 135 09/12/2010   K 4.6 09/12/2010   CL 101 09/12/2010   CREATININE 1.7* 09/12/2010   BUN 36* 09/12/2010   CO2 26 09/12/2010   TSH 0.44 09/12/2010   PSA 3.67 01/12/2010   INR 1.1 ratio* 08/17/2009          Assessment & Plan:

## 2011-08-09 NOTE — Assessment & Plan Note (Addendum)
Better Thor vert fx 4/13 -- better Declined further w/up and Rx

## 2011-08-09 NOTE — Assessment & Plan Note (Signed)
Thor vert fx 4/13 -- better Declined further w/up and Rx

## 2011-08-09 NOTE — Assessment & Plan Note (Signed)
Continue with current prescription therapy as reflected on the Med list. meds refilled

## 2011-08-09 NOTE — Assessment & Plan Note (Signed)
Doing better.   

## 2011-09-23 DIAGNOSIS — H353290 Exudative age-related macular degeneration, unspecified eye, stage unspecified: Secondary | ICD-10-CM | POA: Diagnosis not present

## 2011-09-23 DIAGNOSIS — H33059 Total retinal detachment, unspecified eye: Secondary | ICD-10-CM | POA: Diagnosis not present

## 2011-09-29 ENCOUNTER — Other Ambulatory Visit: Payer: Self-pay | Admitting: Internal Medicine

## 2011-09-30 ENCOUNTER — Other Ambulatory Visit: Payer: Self-pay | Admitting: *Deleted

## 2011-09-30 MED ORDER — DONEPEZIL HCL 10 MG PO TABS: 10.0000 mg | ORAL_TABLET | Freq: Every day | ORAL | Status: DC

## 2011-10-24 ENCOUNTER — Encounter: Payer: Self-pay | Admitting: Internal Medicine

## 2011-10-24 ENCOUNTER — Ambulatory Visit (INDEPENDENT_AMBULATORY_CARE_PROVIDER_SITE_OTHER): Payer: Medicare Other | Admitting: *Deleted

## 2011-10-24 DIAGNOSIS — I5022 Chronic systolic (congestive) heart failure: Secondary | ICD-10-CM | POA: Diagnosis not present

## 2011-10-24 DIAGNOSIS — Z95810 Presence of automatic (implantable) cardiac defibrillator: Secondary | ICD-10-CM | POA: Diagnosis not present

## 2011-10-29 LAB — REMOTE ICD DEVICE
AL AMPLITUDE: 2.1 mv
AL IMPEDENCE ICD: 494 Ohm
BAMS-0001: 170 {beats}/min
BATTERY VOLTAGE: 3.0938 V
RV LEAD AMPLITUDE: 9.8 mv
TOT-0006: 20110602000000
TZAT-0001ATACH: 1
TZAT-0001ATACH: 2
TZAT-0001ATACH: 3
TZAT-0001SLOWVT: 2
TZAT-0002ATACH: NEGATIVE
TZAT-0004SLOWVT: 8
TZAT-0004SLOWVT: 8
TZAT-0005SLOWVT: 88 pct
TZAT-0005SLOWVT: 91 pct
TZAT-0012ATACH: 150 ms
TZAT-0012ATACH: 150 ms
TZAT-0012SLOWVT: 200 ms
TZAT-0012SLOWVT: 200 ms
TZAT-0013SLOWVT: 2
TZAT-0013SLOWVT: 2
TZAT-0018ATACH: NEGATIVE
TZAT-0018ATACH: NEGATIVE
TZAT-0018FASTVT: NEGATIVE
TZAT-0019ATACH: 6 V
TZAT-0019FASTVT: 8 V
TZAT-0020ATACH: 1.5 ms
TZAT-0020FASTVT: 1.5 ms
TZON-0003ATACH: 350 ms
TZON-0003SLOWVT: 380 ms
TZON-0003VSLOWVT: 410 ms
TZON-0004SLOWVT: 28
TZON-0004VSLOWVT: 32
TZST-0001ATACH: 4
TZST-0001ATACH: 6
TZST-0001FASTVT: 2
TZST-0001FASTVT: 6
TZST-0001SLOWVT: 3
TZST-0001SLOWVT: 5
TZST-0001SLOWVT: 6
TZST-0002ATACH: NEGATIVE
TZST-0002FASTVT: NEGATIVE
TZST-0002FASTVT: NEGATIVE
TZST-0002FASTVT: NEGATIVE
TZST-0003SLOWVT: 25 J
TZST-0003SLOWVT: 35 J
VENTRICULAR PACING ICD: 0.01 pct
VF: 0

## 2011-11-07 ENCOUNTER — Encounter: Payer: Self-pay | Admitting: *Deleted

## 2011-11-22 ENCOUNTER — Encounter: Payer: Self-pay | Admitting: Internal Medicine

## 2011-11-22 ENCOUNTER — Ambulatory Visit (INDEPENDENT_AMBULATORY_CARE_PROVIDER_SITE_OTHER): Payer: Medicare Other | Admitting: Internal Medicine

## 2011-11-22 VITALS — BP 110/58 | HR 70 | Temp 97.7°F | Resp 16 | Wt 157.8 lb

## 2011-11-22 DIAGNOSIS — I2510 Atherosclerotic heart disease of native coronary artery without angina pectoris: Secondary | ICD-10-CM | POA: Diagnosis not present

## 2011-11-22 DIAGNOSIS — F4321 Adjustment disorder with depressed mood: Secondary | ICD-10-CM

## 2011-11-22 DIAGNOSIS — M545 Low back pain, unspecified: Secondary | ICD-10-CM

## 2011-11-22 DIAGNOSIS — Z0000 Encounter for general adult medical examination without abnormal findings: Secondary | ICD-10-CM | POA: Diagnosis not present

## 2011-11-22 DIAGNOSIS — N400 Enlarged prostate without lower urinary tract symptoms: Secondary | ICD-10-CM | POA: Diagnosis not present

## 2011-11-22 DIAGNOSIS — N259 Disorder resulting from impaired renal tubular function, unspecified: Secondary | ICD-10-CM

## 2011-11-22 DIAGNOSIS — S22009A Unspecified fracture of unspecified thoracic vertebra, initial encounter for closed fracture: Secondary | ICD-10-CM

## 2011-11-22 DIAGNOSIS — R413 Other amnesia: Secondary | ICD-10-CM

## 2011-11-22 DIAGNOSIS — I10 Essential (primary) hypertension: Secondary | ICD-10-CM

## 2011-11-22 DIAGNOSIS — I5022 Chronic systolic (congestive) heart failure: Secondary | ICD-10-CM

## 2011-11-22 DIAGNOSIS — S22080A Wedge compression fracture of T11-T12 vertebra, initial encounter for closed fracture: Secondary | ICD-10-CM

## 2011-11-22 MED ORDER — HYDROCODONE-HOMATROPINE 5-1.5 MG/5ML PO SYRP: 2.5000 mL | ORAL_SOLUTION | Freq: Three times a day (TID) | ORAL | Status: AC | PRN

## 2011-11-22 MED ORDER — DONEPEZIL HCL 10 MG PO TABS: 10.0000 mg | ORAL_TABLET | Freq: Every day | ORAL | Status: DC

## 2011-11-22 MED ORDER — NITROGLYCERIN 0.4 MG SL SUBL: 0.4000 mg | SUBLINGUAL_TABLET | SUBLINGUAL | Status: DC | PRN

## 2011-11-22 MED ORDER — METOPROLOL SUCCINATE ER 50 MG PO TB24: 25.0000 mg | ORAL_TABLET | Freq: Every day | ORAL | Status: DC

## 2011-11-22 NOTE — Assessment & Plan Note (Signed)
Continue with current prescription therapy as reflected on the Med list.  

## 2011-11-22 NOTE — Assessment & Plan Note (Signed)
Better  

## 2011-11-22 NOTE — Assessment & Plan Note (Signed)
Watching  

## 2011-11-22 NOTE — Assessment & Plan Note (Signed)
The patient is here for annual Medicare wellness examination and management of other chronic and acute problems.   The risk factors are reflected in the social history.  The roster of all physicians providing medical care to patient - is listed in the Snapshot section of the chart.  Activities of daily living:  The patient is 100% inedpendent in all ADLs: dressing, toileting, feeding as well as independent mobility  Home safety : The patient has smoke detectors in the home. They wear seatbelts. There is no violence in the home.   There is no risks for hepatitis, STDs or HIV. There is no   history of blood transfusion. They have no travel history to infectious disease endemic areas of the world.  The patient has  seen their dentist in the last 12 month. They have  seen their eye doctor in the last year. They deny  Any major hearing difficulty and have not had audiologic testing in the last year.  They do not  have excessive sun exposure. Discussed the need for sun protection: hats, long sleeves and use of sunscreen if there is significant sun exposure.   Diet: the importance of a healthy diet is discussed. They do have a reasonably healthy  diet.  The patient has a fairly regular exercise program of a mixed nature: walking, yard work, etc.The benefits of regular aerobic exercise were discussed.  Depression screen: there are no signs or vegative symptoms of depression- irritability, change in appetite, anhedonia, sadness/tearfullness.  Cognitive assessment: the patient manages all their financial and personal affairs and is actively engaged. They could relate day,date,year and events; recalled 3/3 objects at 3 minutes  The following portions of the patient's history were reviewed and updated as appropriate: allergies, current medications, past family history, past medical history,  past surgical history, past social history  and problem list.  Vision, hearing, body mass index were assessed and  reviewed.   During the course of the visit the patient was educated and counseled about appropriate screening and preventive services including : fall prevention , diabetes screening, nutrition counseling, colorectal cancer screening, and recommended immunizations. He had colonosc in 2005. He states he had Zostavax in 2009

## 2011-11-22 NOTE — Assessment & Plan Note (Addendum)
Continue with current prescription therapy as reflected on the Med list. PSA

## 2011-11-22 NOTE — Progress Notes (Signed)
Subjective:    Patient ID: [name] [name], male    DOB: 11-13-1922, 78 y.o.   MRN: [id number]  The patient is here for a wellness exam. The patient has been doing well overall without major physical or psychological issues going on lately.Back Pain Pertinent negatives include no abdominal pain, chest pain or weakness.   C/o lower thor pain (vert fx)  - off and on x months -- better  The patient presents for a follow-up of chronic anal stenosis, hypertension, chronic dyslipidemia, CAD controlled with medicines  Wt Readings from Last 3 Encounters:  11/22/11 157 lb 12 oz (71.555 kg)  08/09/11 154 lb (69.854 kg)  07/09/11 152 lb (68.947 kg)   BP Readings from Last 3 Encounters:  11/22/11 110/58  08/09/11 112/78  07/09/11 128/90      Review of Systems  Constitutional: Negative for appetite change, fatigue and unexpected weight change.  HENT: Negative for nosebleeds, congestion, sore throat, sneezing, trouble swallowing and neck pain.   Eyes: Negative for itching and visual disturbance.  Respiratory: Negative for cough.   Cardiovascular: Negative for chest pain, palpitations and leg swelling.  Gastrointestinal: Negative for nausea, vomiting, abdominal pain, diarrhea, constipation, blood in stool, abdominal distention and rectal pain.  Genitourinary: Negative for frequency and hematuria.  Musculoskeletal: Positive for back pain. Negative for joint swelling and gait problem.  Skin: Negative for rash.  Neurological: Negative for dizziness, tremors, speech difficulty and weakness.  Psychiatric/Behavioral: Negative for disturbed wake/sleep cycle, dysphoric mood and agitation. The patient is not nervous/anxious.        Objective:   Physical Exam  Constitutional: He is oriented to person, place, and time. He appears well-developed and well-nourished.  HENT:  Mouth/Throat: Oropharynx is clear and moist.  Eyes: Conjunctivae are normal. Pupils are equal, round, and reactive to light.   Neck: Normal range of motion. No JVD present. No thyromegaly present.  Cardiovascular: Normal rate, regular rhythm, normal heart sounds and intact distal pulses.  Exam reveals no gallop and no friction rub.   No murmur heard. Pulmonary/Chest: Effort normal and breath sounds normal. No respiratory distress. He has no wheezes. He has no rales. He exhibits no tenderness.  Abdominal: Soft. Bowel sounds are normal. He exhibits no distension and no mass. There is no tenderness. There is no rebound and no guarding.  Musculoskeletal: Normal range of motion. He exhibits tenderness (T12 is tender). He exhibits no edema.  Lymphadenopathy:    He has no cervical adenopathy.  Neurological: He is alert and oriented to person, place, and time. He has normal reflexes. No cranial nerve deficit. He exhibits normal muscle tone. Coordination normal.  Skin: Skin is warm and dry. No rash noted.  Psychiatric: He has a normal mood and affect. His behavior is normal. Judgment and thought content normal.   Lab Results  Component Value Date   WBC 9.7 01/12/2010   HGB 13.7 01/12/2010   HCT 40.1 01/12/2010   PLT 155.0 01/12/2010   GLUCOSE 91 09/12/2010   CHOL 112 01/12/2010   TRIG 94.0 01/12/2010   HDL 38.20* 01/12/2010   LDLCALC 55 01/12/2010   ALT 26 09/12/2010   AST 24 09/12/2010   NA 135 09/12/2010   K 4.6 09/12/2010   CL 101 09/12/2010   CREATININE 1.7* 09/12/2010   BUN 36* 09/12/2010   CO2 26 09/12/2010   TSH 0.44 09/12/2010   PSA 3.67 01/12/2010   INR 1.1 ratio* 08/17/2009  Assessment & Plan:

## 2011-11-27 ENCOUNTER — Other Ambulatory Visit: Payer: Self-pay | Admitting: *Deleted

## 2011-11-27 MED ORDER — DONEPEZIL HCL 10 MG PO TABS: 10.0000 mg | ORAL_TABLET | Freq: Every day | ORAL | Status: DC

## 2011-11-28 ENCOUNTER — Other Ambulatory Visit (INDEPENDENT_AMBULATORY_CARE_PROVIDER_SITE_OTHER): Payer: Medicare Other

## 2011-11-28 DIAGNOSIS — N400 Enlarged prostate without lower urinary tract symptoms: Secondary | ICD-10-CM

## 2011-11-28 DIAGNOSIS — I10 Essential (primary) hypertension: Secondary | ICD-10-CM | POA: Diagnosis not present

## 2011-11-28 DIAGNOSIS — N259 Disorder resulting from impaired renal tubular function, unspecified: Secondary | ICD-10-CM

## 2011-11-28 DIAGNOSIS — R413 Other amnesia: Secondary | ICD-10-CM

## 2011-11-28 DIAGNOSIS — F4321 Adjustment disorder with depressed mood: Secondary | ICD-10-CM | POA: Diagnosis not present

## 2011-11-28 DIAGNOSIS — Z0000 Encounter for general adult medical examination without abnormal findings: Secondary | ICD-10-CM

## 2011-11-28 DIAGNOSIS — M545 Low back pain, unspecified: Secondary | ICD-10-CM

## 2011-11-28 DIAGNOSIS — I2510 Atherosclerotic heart disease of native coronary artery without angina pectoris: Secondary | ICD-10-CM | POA: Diagnosis not present

## 2011-11-28 DIAGNOSIS — S22080A Wedge compression fracture of T11-T12 vertebra, initial encounter for closed fracture: Secondary | ICD-10-CM

## 2011-11-28 DIAGNOSIS — I5022 Chronic systolic (congestive) heart failure: Secondary | ICD-10-CM | POA: Diagnosis not present

## 2011-11-28 DIAGNOSIS — S22009A Unspecified fracture of unspecified thoracic vertebra, initial encounter for closed fracture: Secondary | ICD-10-CM

## 2011-11-28 LAB — COMPREHENSIVE METABOLIC PANEL
ALT: 19 U/L (ref 0–53)
AST: 22 U/L (ref 0–37)
Albumin: 3.7 g/dL (ref 3.5–5.2)
BUN: 33 mg/dL — ABNORMAL HIGH (ref 6–23)
CO2: 26 mEq/L (ref 19–32)
Calcium: 8.9 mg/dL (ref 8.4–10.5)
Chloride: 100 mEq/L (ref 96–112)
Creatinine, Ser: 1.6 mg/dL — ABNORMAL HIGH (ref 0.4–1.5)
GFR: 43.34 mL/min — ABNORMAL LOW (ref >60.00)
Potassium: 4.5 mEq/L (ref 3.5–5.1)

## 2011-11-28 LAB — CBC WITH DIFFERENTIAL/PLATELET
Basophils Absolute: 0.1 10*3/uL (ref 0.0–0.1)
Eosinophils Absolute: 0.4 10*3/uL (ref 0.0–0.7)
Lymphocytes Relative: 13.9 % (ref 12.0–46.0)
MCHC: 33.1 g/dL (ref 30.0–36.0)
Monocytes Relative: 14.3 % — ABNORMAL HIGH (ref 3.0–12.0)
Neutrophils Relative %: 67.4 % (ref 43.0–77.0)
RBC: 4.29 Mil/uL (ref 4.22–5.81)
RDW: 13.6 % (ref 11.5–14.6)

## 2011-11-28 LAB — BASIC METABOLIC PANEL
CO2: 26 mEq/L (ref 19–32)
Chloride: 100 mEq/L (ref 96–112)
Potassium: 4.5 mEq/L (ref 3.5–5.1)

## 2011-11-28 LAB — TSH: TSH: 2.93 u[IU]/mL (ref 0.35–5.50)

## 2011-11-28 LAB — LIPID PANEL
Cholesterol: 104 mg/dL (ref 0–200)
HDL: 37.3 mg/dL — ABNORMAL LOW (ref >39.00)
LDL Cholesterol: 58 mg/dL (ref 0–99)
Triglycerides: 44 mg/dL (ref 0.0–149.0)
VLDL: 8.8 mg/dL (ref 0.0–40.0)

## 2011-11-28 LAB — URINALYSIS
Bilirubin Urine: NEGATIVE
Hgb urine dipstick: NEGATIVE
Nitrite: NEGATIVE
Total Protein, Urine: NEGATIVE
Urobilinogen, UA: 0.2 (ref 0.0–1.0)

## 2011-11-28 LAB — HEPATIC FUNCTION PANEL
Albumin: 3.7 g/dL (ref 3.5–5.2)
Total Protein: 6.5 g/dL (ref 6.0–8.3)

## 2011-11-28 LAB — PSA: PSA: 3.42 ng/mL (ref 0.10–4.00)

## 2011-12-16 DIAGNOSIS — L820 Inflamed seborrheic keratosis: Secondary | ICD-10-CM | POA: Diagnosis not present

## 2011-12-25 ENCOUNTER — Encounter: Payer: Self-pay | Admitting: *Deleted

## 2011-12-26 ENCOUNTER — Telehealth: Payer: Self-pay | Admitting: Internal Medicine

## 2011-12-26 NOTE — Telephone Encounter (Signed)
Use over-the-counter  "cold" medicines  such as  "Afrin" nasal spray for nasal congestion as directed instead. Use" Delsym" or" Robitussin" cough syrup varietis for cough.  You can use plain "Tylenol" or "Advi"l for fever, chills and achyness.   "Common cold" symptoms are usually triggered by a virus.  The antibiotics are usually not necessary. On average, a" viral cold" illness would take 4-7 days to resolve. Please, make an appointment if you are not better or if you're worse.  

## 2011-12-26 NOTE — Telephone Encounter (Signed)
Pt informed

## 2011-12-26 NOTE — Telephone Encounter (Signed)
Caller: [name]/Patient; Patient Name: [name] [name]; PCP: Sonda Primes (Adults only); Best Callback Phone Number: [phone] Onset 12/26/11  Afebrile, Sore Muscles and slight Headache, pain 4/10. Also states some nasal congestion.  States thinks he has the Flu.  States felt like he had a fever last night unsure of temp.  Has appointment to see Cardiologist 12/31/11 and was waiting to get Flu shot then.  Urgent symptom of "New or worsening flu-like illness and in high risk group for complications" per Flu - Like Symptoms protocol.  Disposition Call provider within 8 hours.   OFFICE NOTE: DISPOSITION CALL PROVIDER IN 8 HOURS.  PLEASE FOLLOW UP WITH PATIENT WITH FURTHER INSTRUCTION OR APPOINTMENT TIME.

## 2011-12-29 ENCOUNTER — Emergency Department (HOSPITAL_COMMUNITY): Payer: Medicare Other

## 2011-12-29 ENCOUNTER — Inpatient Hospital Stay (HOSPITAL_COMMUNITY)
Admission: EM | Admit: 2011-12-29 | Discharge: 2012-01-02 | DRG: 644 | Disposition: A | Discharge status: Home Health | Payer: Medicare Other | Attending: Internal Medicine | Admitting: Internal Medicine

## 2011-12-29 ENCOUNTER — Encounter (HOSPITAL_COMMUNITY): Payer: Self-pay | Admitting: *Deleted

## 2011-12-29 DIAGNOSIS — K624 Stenosis of anus and rectum: Secondary | ICD-10-CM

## 2011-12-29 DIAGNOSIS — Z23 Encounter for immunization: Secondary | ICD-10-CM

## 2011-12-29 DIAGNOSIS — E039 Hypothyroidism, unspecified: Secondary | ICD-10-CM

## 2011-12-29 DIAGNOSIS — M545 Low back pain, unspecified: Secondary | ICD-10-CM

## 2011-12-29 DIAGNOSIS — I495 Sick sinus syndrome: Secondary | ICD-10-CM | POA: Diagnosis not present

## 2011-12-29 DIAGNOSIS — Z79899 Other long term (current) drug therapy: Secondary | ICD-10-CM

## 2011-12-29 DIAGNOSIS — E871 Hypo-osmolality and hyponatremia: Secondary | ICD-10-CM

## 2011-12-29 DIAGNOSIS — R509 Fever, unspecified: Secondary | ICD-10-CM | POA: Diagnosis not present

## 2011-12-29 DIAGNOSIS — R413 Other amnesia: Secondary | ICD-10-CM | POA: Diagnosis not present

## 2011-12-29 DIAGNOSIS — Z7982 Long term (current) use of aspirin: Secondary | ICD-10-CM

## 2011-12-29 DIAGNOSIS — M810 Age-related osteoporosis without current pathological fracture: Secondary | ICD-10-CM

## 2011-12-29 DIAGNOSIS — N259 Disorder resulting from impaired renal tubular function, unspecified: Secondary | ICD-10-CM

## 2011-12-29 DIAGNOSIS — F4321 Adjustment disorder with depressed mood: Secondary | ICD-10-CM

## 2011-12-29 DIAGNOSIS — R05 Cough: Secondary | ICD-10-CM | POA: Diagnosis not present

## 2011-12-29 DIAGNOSIS — Z950 Presence of cardiac pacemaker: Secondary | ICD-10-CM

## 2011-12-29 DIAGNOSIS — Z87891 Personal history of nicotine dependence: Secondary | ICD-10-CM

## 2011-12-29 DIAGNOSIS — M546 Pain in thoracic spine: Secondary | ICD-10-CM

## 2011-12-29 DIAGNOSIS — N400 Enlarged prostate without lower urinary tract symptoms: Secondary | ICD-10-CM

## 2011-12-29 DIAGNOSIS — B9789 Other viral agents as the cause of diseases classified elsewhere: Secondary | ICD-10-CM | POA: Diagnosis present

## 2011-12-29 DIAGNOSIS — R279 Unspecified lack of coordination: Secondary | ICD-10-CM

## 2011-12-29 DIAGNOSIS — I2510 Atherosclerotic heart disease of native coronary artery without angina pectoris: Secondary | ICD-10-CM

## 2011-12-29 DIAGNOSIS — M1990 Unspecified osteoarthritis, unspecified site: Secondary | ICD-10-CM

## 2011-12-29 DIAGNOSIS — D71 Functional disorders of polymorphonuclear neutrophils: Secondary | ICD-10-CM | POA: Diagnosis not present

## 2011-12-29 DIAGNOSIS — L570 Actinic keratosis: Secondary | ICD-10-CM

## 2011-12-29 DIAGNOSIS — J069 Acute upper respiratory infection, unspecified: Secondary | ICD-10-CM

## 2011-12-29 DIAGNOSIS — Z95810 Presence of automatic (implantable) cardiac defibrillator: Secondary | ICD-10-CM

## 2011-12-29 DIAGNOSIS — I5022 Chronic systolic (congestive) heart failure: Secondary | ICD-10-CM | POA: Diagnosis not present

## 2011-12-29 DIAGNOSIS — I10 Essential (primary) hypertension: Secondary | ICD-10-CM

## 2011-12-29 DIAGNOSIS — S22080A Wedge compression fracture of T11-T12 vertebra, initial encounter for closed fracture: Secondary | ICD-10-CM

## 2011-12-29 DIAGNOSIS — H9190 Unspecified hearing loss, unspecified ear: Secondary | ICD-10-CM | POA: Diagnosis present

## 2011-12-29 DIAGNOSIS — Z0000 Encounter for general adult medical examination without abnormal findings: Secondary | ICD-10-CM

## 2011-12-29 DIAGNOSIS — E236 Other disorders of pituitary gland: Principal | ICD-10-CM | POA: Diagnosis present

## 2011-12-29 LAB — URINALYSIS, ROUTINE W REFLEX MICROSCOPIC
Bilirubin Urine: NEGATIVE
Ketones, ur: NEGATIVE mg/dL
Nitrite: NEGATIVE
pH: 6.5 (ref 5.0–8.0)

## 2011-12-29 LAB — COMPREHENSIVE METABOLIC PANEL
ALT: 16 U/L (ref 0–53)
AST: 23 U/L (ref 0–37)
Alkaline Phosphatase: 98 U/L (ref 39–117)
CO2: 23 mEq/L (ref 19–32)
Chloride: 84 mEq/L — ABNORMAL LOW (ref 96–112)
GFR calc Af Amer: 56 mL/min — ABNORMAL LOW (ref >90)
GFR calc non Af Amer: 49 mL/min — ABNORMAL LOW (ref >90)
Glucose, Bld: 97 mg/dL (ref 70–99)
Potassium: 4.5 mEq/L (ref 3.5–5.1)
Sodium: 116 mEq/L — CL (ref 135–145)
Total Bilirubin: 1.1 mg/dL (ref 0.3–1.2)

## 2011-12-29 LAB — BASIC METABOLIC PANEL
BUN: 25 mg/dL — ABNORMAL HIGH (ref 6–23)
CO2: 24 mEq/L (ref 19–32)
CO2: 25 mEq/L (ref 19–32)
Calcium: 8.6 mg/dL (ref 8.4–10.5)
Calcium: 8.4 mg/dL (ref 8.4–10.5)
Chloride: 83 mEq/L — ABNORMAL LOW (ref 96–112)
Creatinine, Ser: 1.35 mg/dL (ref 0.50–1.35)
GFR calc non Af Amer: 43 mL/min — ABNORMAL LOW (ref >90)
Glucose, Bld: 95 mg/dL (ref 70–99)
Glucose, Bld: 99 mg/dL (ref 70–99)
Sodium: 116 mEq/L — CL (ref 135–145)

## 2011-12-29 LAB — INFLUENZA PANEL BY PCR (TYPE A & B): Influenza B By PCR: NEGATIVE

## 2011-12-29 LAB — SODIUM, URINE, RANDOM: Sodium, Ur: 58 mEq/L

## 2011-12-29 LAB — CBC WITH DIFFERENTIAL/PLATELET
Basophils Absolute: 0.1 10*3/uL (ref 0.0–0.1)
Eosinophils Absolute: 0.4 10*3/uL (ref 0.0–0.7)
HCT: 38.5 % — ABNORMAL LOW (ref 39.0–52.0)
Hemoglobin: 13.8 g/dL (ref 13.0–17.0)
MCH: 30.7 pg (ref 26.0–34.0)
MCHC: 35.8 g/dL (ref 30.0–36.0)
Monocytes Absolute: 1.9 10*3/uL — ABNORMAL HIGH (ref 0.1–1.0)
Monocytes Relative: 19 % — ABNORMAL HIGH (ref 3–12)
Neutrophils Relative %: 63 % (ref 43–77)

## 2011-12-29 MED ORDER — DONEPEZIL HCL 10 MG PO TABS
10.0000 mg | ORAL_TABLET | Freq: Every day | ORAL | Status: DC
  Administered 2011-12-29 – 2012-01-01 (×4): 10 mg via ORAL
  Filled 2011-12-29 (×5): qty 1

## 2011-12-29 MED ORDER — ASPIRIN 81 MG PO TBEC
81.0000 mg | DELAYED_RELEASE_TABLET | Freq: Every day | ORAL | Status: DC
Start: 2011-12-29 — End: 2011-12-29

## 2011-12-29 MED ORDER — LEVOTHYROXINE SODIUM 100 MCG PO TABS
100.0000 ug | ORAL_TABLET | Freq: Every day | ORAL | Status: DC
  Administered 2011-12-30 – 2012-01-02 (×4): 100 ug via ORAL
  Filled 2011-12-29 (×5): qty 1

## 2011-12-29 MED ORDER — ONDANSETRON HCL 4 MG/2ML IJ SOLN: 4.0000 mg | Freq: Four times a day (QID) | INTRAMUSCULAR | Status: DC | PRN

## 2011-12-29 MED ORDER — METOPROLOL SUCCINATE ER 25 MG PO TB24
25.0000 mg | ORAL_TABLET | Freq: Every day | ORAL | Status: DC
  Administered 2011-12-30 – 2012-01-01 (×3): 25 mg via ORAL
  Filled 2011-12-29 (×3): qty 1

## 2011-12-29 MED ORDER — ACETAMINOPHEN 325 MG PO TABS
650.0000 mg | ORAL_TABLET | Freq: Four times a day (QID) | ORAL | Status: DC | PRN
  Administered 2012-01-01: 650 mg via ORAL
  Filled 2011-12-29: qty 2

## 2011-12-29 MED ORDER — INFLUENZA VIRUS VACC SPLIT PF IM SUSP
0.5000 mL | INTRAMUSCULAR | Status: AC
  Administered 2011-12-30: 0.5 mL via INTRAMUSCULAR
  Filled 2011-12-29: qty 0.5

## 2011-12-29 MED ORDER — ATORVASTATIN CALCIUM 10 MG PO TABS
10.0000 mg | ORAL_TABLET | Freq: Two times a day (BID) | ORAL | Status: DC
  Administered 2011-12-29 – 2012-01-02 (×8): 10 mg via ORAL
  Filled 2011-12-29 (×9): qty 1

## 2011-12-29 MED ORDER — VITAMIN D3 25 MCG (1000 UNIT) PO TABS
1000.0000 [IU] | ORAL_TABLET | Freq: Every day | ORAL | Status: DC
  Administered 2011-12-30 – 2012-01-02 (×4): 1000 [IU] via ORAL
  Filled 2011-12-29 (×5): qty 1

## 2011-12-29 MED ORDER — NITROGLYCERIN 0.4 MG SL SUBL: 0.4000 mg | SUBLINGUAL_TABLET | SUBLINGUAL | Status: DC | PRN

## 2011-12-29 MED ORDER — ONDANSETRON HCL 4 MG PO TABS: 4.0000 mg | ORAL_TABLET | Freq: Four times a day (QID) | ORAL | Status: DC | PRN

## 2011-12-29 MED ORDER — SODIUM CHLORIDE 0.9 % IV SOLN
INTRAVENOUS | Status: DC
  Administered 2011-12-29 – 2011-12-31 (×3): via INTRAVENOUS

## 2011-12-29 MED ORDER — SODIUM CHLORIDE 1 G PO TABS
1.0000 g | ORAL_TABLET | Freq: Once | ORAL | Status: AC
  Administered 2011-12-29: 1 g via ORAL
  Filled 2011-12-29: qty 1

## 2011-12-29 MED ORDER — ASPIRIN EC 81 MG PO TBEC
81.0000 mg | DELAYED_RELEASE_TABLET | Freq: Every day | ORAL | Status: DC
  Administered 2011-12-30 – 2012-01-02 (×4): 81 mg via ORAL
  Filled 2011-12-29 (×4): qty 1

## 2011-12-29 MED ORDER — ACETAMINOPHEN 650 MG RE SUPP: 650.0000 mg | Freq: Four times a day (QID) | RECTAL | Status: DC | PRN

## 2011-12-29 MED ORDER — DOCUSATE SODIUM 100 MG PO CAPS
100.0000 mg | ORAL_CAPSULE | Freq: Every day | ORAL | Status: DC | PRN
  Administered 2012-01-01: 100 mg via ORAL
  Filled 2011-12-29: qty 1

## 2011-12-29 MED ORDER — HEPARIN SODIUM (PORCINE) 5000 UNIT/ML IJ SOLN
5000.0000 [IU] | Freq: Three times a day (TID) | INTRAMUSCULAR | Status: DC
  Administered 2011-12-29 – 2012-01-02 (×9): 5000 [IU] via SUBCUTANEOUS
  Filled 2011-12-29 (×15): qty 1

## 2011-12-29 NOTE — ED Provider Notes (Signed)
History     CSN: [id number]  Arrival date & time 12/29/11  4098   First MD Initiated Contact with Patient 12/29/11 1225      Chief Complaint  Patient presents with  . Cough  . Fever    (Consider location/radiation/quality/duration/timing/severity/associated sxs/prior treatment) HPI Comments: The patient is an 78 year old man who says his shoulder feels like. Initially he was over a period that he developed cough, and intermittent chills and fever. He tried to call him to be seen by his primary care doctor last week but couldn't get him. He has taken Tylenol for fever. His illnesses lasted about 8 days.  Patient is a 78 y.o. male presenting with general illness. The history is provided by the patient and medical records. No language interpreter was used.  Illness  The current episode started more than 1 week ago. The problem occurs continuously. Progression since onset: Fever and chills waxing and waning. The problem is severe. Nothing relieves the symptoms. Nothing aggravates the symptoms. Associated symptoms include a fever, congestion, muscle aches and cough. He has received no recent medical care.    Past Medical History  Diagnosis Date  . Hypertension   . Hypothyroid   . CAD (coronary artery disease)     Dr. Ladona Ridgel  . Osteoarthritis   . BPH (benign prostatic hypertrophy)     Dr. Vonita Moss  . Osteoporosis     (took actonel x 5y)  . Renal insufficiency     Dr. Merrily Brittle  . Hip fx     x 2 on R.  . Sternum fx 2011  . Urinary incontinence   . Hearing loss     Past Surgical History  Procedure Date  . Heart disease     permanent pacemaker  . Appendectomy   . Inguinal hernia repair   . Hip fracture surgery 2011    ORIF  R.  . Pacemaker insertion   . Colonoscopy 12/1998, 02/2004    diverticulosois, external hemorrhoids    Family History  Problem Relation Age of Onset  . Coronary artery disease      male 1st degree relative<60  . Hypertension    . Cancer  Sister     History  Substance Use Topics  . Smoking status: Former Games developer  . Smokeless tobacco: Never Used  . Alcohol Use: No      Review of Systems  Constitutional: Positive for fever and chills.  HENT: Positive for congestion.   Eyes: Negative.   Respiratory: Positive for cough.   Cardiovascular: Negative.   Gastrointestinal: Negative.   Genitourinary: Negative.   Musculoskeletal: Negative.   Skin: Negative.   Neurological: Negative.   Psychiatric/Behavioral: Negative.     Allergies  Review of patient's allergies indicates no known allergies.  Home Medications   Current Outpatient Rx  Name Route Sig Dispense Refill  . ASPIRIN 81 MG PO TBEC Oral Take 81 mg by mouth daily.      . ATORVASTATIN CALCIUM 20 MG PO TABS Oral Take 10 mg by mouth 2 (two) times daily.    Marland Kitchen VITAMIN D3 1000 UNITS PO TABS Oral Take 1,000 Units by mouth daily.      . DONEPEZIL HCL 10 MG PO TABS Oral Take 10 mg by mouth at bedtime.    Marland Kitchen FOLIC ACID 800 MCG PO TABS Oral Take 800 mcg by mouth daily.     Marland Kitchen LEVOTHYROXINE SODIUM 100 MCG PO TABS Oral Take 100 mcg by mouth daily.    Marland Kitchen METOPROLOL SUCCINATE  ER 50 MG PO TB24 Oral Take 25 mg by mouth daily. Take with or immediately following a meal.    . OSTEO BI-FLEX ADV DOUBLE ST PO TABS Oral Take 1 tablet by mouth at bedtime.     Marland Kitchen OVER THE COUNTER MEDICATION Nasal Place 1 spray into the nose 4 (four) times daily as needed. Nasal spray nasal congestion    . TRIAMCINOLONE ACETONIDE 55 MCG/ACT NA INHA Nasal Place 1 spray into the nose daily.    Marland Kitchen NITROGLYCERIN 0.4 MG SL SUBL Sublingual Place 0.4 mg under the tongue every 5 (five) minutes as needed. Chest pain      BP 122/68  Pulse 60  Temp 97.6 F (36.4 C) (Oral)  Resp 20  SpO2 100%  Physical Exam  Nursing note and vitals reviewed. Constitutional: He is oriented to person, place, and time. He appears well-developed and well-nourished. No distress.  HENT:  Head: Normocephalic and atraumatic.  Right  Ear: External ear normal.  Left Ear: External ear normal.  Mouth/Throat: Oropharynx is clear and moist.  Eyes: Conjunctivae normal and EOM are normal. Pupils are equal, round, and reactive to light.  Neck: Normal range of motion. Neck supple.  Cardiovascular: Normal rate, regular rhythm and normal heart sounds.   Pulmonary/Chest: Effort normal and breath sounds normal.  Abdominal: Soft. Bowel sounds are normal.  Musculoskeletal: Normal range of motion. He exhibits no edema and no tenderness.  Neurological: He is alert and oriented to person, place, and time.       No sensory or motor deficit.  Skin: Skin is warm and dry.  Psychiatric: He has a normal mood and affect. His behavior is normal.    ED Course  Procedures (including critical care time)  Labs Reviewed  CBC WITH DIFFERENTIAL - Abnormal; Notable for the following:    HCT 38.5 (*)     Monocytes Relative 19 (*)     Monocytes Absolute 1.9 (*)     All other components within normal limits  COMPREHENSIVE METABOLIC PANEL - Abnormal; Notable for the following:    Sodium 116 (*)     Chloride 84 (*)     GFR calc non Af Amer 49 (*)     GFR calc Af Amer 56 (*)     All other components within normal limits  URINALYSIS, ROUTINE W REFLEX MICROSCOPIC   Dg Chest 2 View  12/29/2011  *RADIOLOGY REPORT*  Clinical Data: Cough and congestion.  Fever.  Hypertension.  CHEST - 2 VIEW  Comparison: 05/15/2011  Findings: AICD noted with unchanged lead positioning.  Atherosclerotic aortic arch noted.  Old granulomatous disease noted on the right.  Heart size is within normal limits.  Thoracic spondylosis noted with several chronic thoracic compression fractures.  Probable remote sternal fracture.  IMPRESSION:  1.  No acute thoracic findings. 2.  Old granulomatous disease. 3.  Old thoracic compression fractures and old sternal fracture. 4.  Atherosclerotic aortic arch.   Original Report Authenticated By: Dellia Cloud, M.D.     Date: 12/29/2011   Rate: 75  Rhythm: normal sinus rhythm  QRS Axis: right  Intervals: PR prolonged QRS:  Q waves in inferior leads suggest old inferior myocardial infarction.  ST/T Wave abnormalities: Inverted T waves in inferior and lateral leads.  Conduction Disutrbances:nonspecific intraventricular conduction delay  Narrative Interpretation: Abnormal EKg.  Old EKG Reviewed: unchanged  1:17 PM Results for orders placed during the hospital encounter of 12/29/11  URINALYSIS, ROUTINE W REFLEX MICROSCOPIC  Component Value Range   Color, Urine YELLOW  YELLOW   APPearance CLEAR  CLEAR   Specific Gravity, Urine 1.012  1.005 - 1.030   pH 6.5  5.0 - 8.0   Glucose, UA NEGATIVE  NEGATIVE mg/dL   Hgb urine dipstick NEGATIVE  NEGATIVE   Bilirubin Urine NEGATIVE  NEGATIVE   Ketones, ur NEGATIVE  NEGATIVE mg/dL   Protein, ur NEGATIVE  NEGATIVE mg/dL   Urobilinogen, UA 0.2  0.0 - 1.0 mg/dL   Nitrite NEGATIVE  NEGATIVE   Leukocytes, UA NEGATIVE  NEGATIVE  CBC WITH DIFFERENTIAL      Component Value Range   WBC 10.1  4.0 - 10.5 K/uL   RBC 4.49  4.22 - 5.81 MIL/uL   Hemoglobin 13.8  13.0 - 17.0 g/dL   HCT 09.8 (*) 11.9 - 14.7 %   MCV 85.7  78.0 - 100.0 fL   MCH 30.7  26.0 - 34.0 pg   MCHC 35.8  30.0 - 36.0 g/dL   RDW 82.9  56.2 - 13.0 %   Platelets 199  150 - 400 K/uL   Neutrophils Relative 63  43 - 77 %   Neutro Abs 6.4  1.7 - 7.7 K/uL   Lymphocytes Relative 14  12 - 46 %   Lymphs Abs 1.4  0.7 - 4.0 K/uL   Monocytes Relative 19 (*) 3 - 12 %   Monocytes Absolute 1.9 (*) 0.1 - 1.0 K/uL   Eosinophils Relative 4  0 - 5 %   Eosinophils Absolute 0.4  0.0 - 0.7 K/uL   Basophils Relative 1  0 - 1 %   Basophils Absolute 0.1  0.0 - 0.1 K/uL  COMPREHENSIVE METABOLIC PANEL      Component Value Range   Sodium 116 (*) 135 - 145 mEq/L   Potassium 4.5  3.5 - 5.1 mEq/L   Chloride 84 (*) 96 - 112 mEq/L   CO2 23  19 - 32 mEq/L   Glucose, Bld 97  70 - 99 mg/dL   BUN 18  6 - 23 mg/dL   Creatinine, Ser 8.65  0.50 -  1.35 mg/dL   Calcium 9.1  8.4 - 78.4 mg/dL   Total Protein 6.9  6.0 - 8.3 g/dL   Albumin 3.7  3.5 - 5.2 g/dL   AST 23  0 - 37 U/L   ALT 16  0 - 53 U/L   Alkaline Phosphatase 98  39 - 117 U/L   Total Bilirubin 1.1  0.3 - 1.2 mg/dL   GFR calc non Af Amer 49 (*) >90 mL/min   GFR calc Af Amer 56 (*) >90 mL/min   Dg Chest 2 View  12/29/2011  *RADIOLOGY REPORT*  Clinical Data: Cough and congestion.  Fever.  Hypertension.  CHEST - 2 VIEW  Comparison: 05/15/2011  Findings: AICD noted with unchanged lead positioning.  Atherosclerotic aortic arch noted.  Old granulomatous disease noted on the right.  Heart size is within normal limits.  Thoracic spondylosis noted with several chronic thoracic compression fractures.  Probable remote sternal fracture.  IMPRESSION:  1.  No acute thoracic findings. 2.  Old granulomatous disease. 3.  Old thoracic compression fractures and old sternal fracture. 4.  Atherosclerotic aortic arch.   Original Report Authenticated By: Dellia Cloud, M.D.     Lab workup showed hyponatremia of 116.  Advised admission to correct this.    1. Hyponatremia            Carleene Cooper III,  MD 12/30/11 1610

## 2011-12-29 NOTE — H&P (Signed)
[facility] History and Physical  [name] [name] ION:[id number] DOB: [birth year] DOA: 12/29/2011  Referring physician: Dr. Ignacia Palma PCP: Sonda Primes, MD  Specialists: none  Chief Complaint: Body aches  HPI: [name] [name] is a 78 y.o. male  Reports that over the last 8 days he has been having rhinorrhea and body aches.  The problem has been persistent and he has mild relief with tylenol.  Nothing else makes him feel worse that he is aware of.  The problem occurred insidiously 8 days ago and has persisted.  Was associated with generalized body aches.  He denies any recent sick contacts.  He lives at home by himself but reports that his son lives "2 doors down" from him.  He reports that he eats a low sodium diet and that within the last week his oral intake has been poor.  In the ED patient's wbc was WNL at 10.1, Chest x ray 2 view did not show any acute thoracic findings, U/A was WNL's, and Sodium level was 116.  We were subsequently contacted for evaluation and recommendations regarding admission for hyponatremia  Review of Systems: The patient denies anorexia, + fevers, weight loss,, vision loss, decreased hearing, hoarseness, chest pain, syncope, dyspnea on exertion, peripheral edema, balance deficits, hemoptysis, abdominal pain, melena, hematochezia, severe indigestion/heartburn, hematuria, incontinence, genital sores, muscle weakness, suspicious skin lesions, transient blindness, difficulty walking, depression, unusual weight change, abnormal bleeding, enlarged lymph nodes, angioedema, and breast masses.    Past Medical History  Diagnosis Date  . Hypertension   . Hypothyroid   . CAD (coronary artery disease)     Dr. Ladona Ridgel  . Osteoarthritis   . BPH (benign prostatic hypertrophy)     Dr. Vonita Moss  . Osteoporosis     (took actonel x 5y)  . Renal insufficiency     Dr. Merrily Brittle  . Hip fx     x 2 on R.  . Sternum fx 2011  . Urinary incontinence   . Hearing loss     Past Surgical History  Procedure Date  . Heart disease     permanent pacemaker  . Appendectomy   . Inguinal hernia repair   . Hip fracture surgery 2011    ORIF  R.  . Pacemaker insertion   . Colonoscopy 12/1998, 02/2004    diverticulosois, external hemorrhoids   Social History:  reports that he has quit smoking. He has never used smokeless tobacco. He reports that he does not drink alcohol or use illicit drugs. Lives at home by himself Can patient participate in ADLs? yes  No Known Allergies  Family History  Problem Relation Age of Onset  . Coronary artery disease      male 1st degree relative<60  . Hypertension    . Cancer Sister   As indicated above no other condition reported by patient when asked directly.  Prior to Admission medications   Medication Sig Start Date End Date Taking? Authorizing Provider  aspirin 81 MG EC tablet Take 81 mg by mouth daily.     Yes Historical Provider, MD  atorvastatin (LIPITOR) 20 MG tablet Take 10 mg by mouth 2 (two) times daily. 05/15/11  Yes Romero Belling, MD  Cholecalciferol (VITAMIN D3) 1000 UNITS tablet Take 1,000 Units by mouth daily.     Yes Historical Provider, MD  donepezil (ARICEPT) 10 MG tablet Take 10 mg by mouth at bedtime. 11/27/11  Yes Georgina Quint Plotnikov, MD  folic acid (FOLVITE) 800 MCG tablet Take 800 mcg by mouth  daily.    Yes Historical Provider, MD  levothyroxine (SYNTHROID, LEVOTHROID) 100 MCG tablet Take 100 mcg by mouth daily. 08/09/11  Yes Georgina Quint Plotnikov, MD  metoprolol succinate (TOPROL-XL) 50 MG 24 hr tablet Take 25 mg by mouth daily. Take with or immediately following a meal. 11/22/11  Yes Tresa Garter, MD  Misc Natural Products (OSTEO BI-FLEX ADV DOUBLE ST) TABS Take 1 tablet by mouth at bedtime.    Yes Historical Provider, MD  OVER THE COUNTER MEDICATION Place 1 spray into the nose 4 (four) times daily as needed. Nasal spray nasal congestion   Yes Historical Provider, MD  triamcinolone (NASACORT) 55  MCG/ACT nasal inhaler Place 1 spray into the nose daily. 05/23/11  Yes Georgina Quint Plotnikov, MD  nitroGLYCERIN (NITROSTAT) 0.4 MG SL tablet Place 0.4 mg under the tongue every 5 (five) minutes as needed. Chest pain 11/22/11 11/21/12  Tresa Garter, MD   Physical Exam: Filed Vitals:   12/29/11 0954 12/29/11 1210 12/29/11 1245 12/29/11 1345  BP: 124/78 122/68 124/65 134/67  Pulse: 66 60 61 60  Temp: 97.6 F (36.4 C)     TempSrc: Oral     Resp: 20  17 21   SpO2: 100% 100% 98% 95%     General:  Pt in NAD, Alert and awake  Eyes: EOMI, PERRLA  ENT: no masses on visual examination, normal ext. appearance  Neck: supple, no goiter  Cardiovascular: RRR, No MRG  Respiratory: CTA BL, no wheezes  Abdomen: Soft, NT, ND  Skin: warm and dry   Musculoskeletal: no clubbing or cyanosis  Psychiatric: normal mood and affect  Neurologic: Answers questions appropriately and moves all extremities.  Labs on Admission:  Basic Metabolic Panel:  Lab 12/29/11 1478  NA 116*  K 4.5  CL 84*  CO2 23  GLUCOSE 97  BUN 18  CREATININE 1.28  CALCIUM 9.1  MG --  PHOS --   Liver Function Tests:  Lab 12/29/11 1019  AST 23  ALT 16  ALKPHOS 98  BILITOT 1.1  PROT 6.9  ALBUMIN 3.7   No results found for this basename: LIPASE:5,AMYLASE:5 in the last 168 hours No results found for this basename: AMMONIA:5 in the last 168 hours CBC:  Lab 12/29/11 1019  WBC 10.1  NEUTROABS 6.4  HGB 13.8  HCT 38.5*  MCV 85.7  PLT 199   Cardiac Enzymes: No results found for this basename: CKTOTAL:5,CKMB:5,CKMBINDEX:5,TROPONINI:5 in the last 168 hours  BNP (last 3 results) No results found for this basename: PROBNP:3 in the last 8760 hours CBG: No results found for this basename: GLUCAP:5 in the last 168 hours  Radiological Exams on Admission: Dg Chest 2 View  12/29/2011  *RADIOLOGY REPORT*  Clinical Data: Cough and congestion.  Fever.  Hypertension.  CHEST - 2 VIEW  Comparison: 05/15/2011   Findings: AICD noted with unchanged lead positioning.  Atherosclerotic aortic arch noted.  Old granulomatous disease noted on the right.  Heart size is within normal limits.  Thoracic spondylosis noted with several chronic thoracic compression fractures.  Probable remote sternal fracture.  IMPRESSION:  1.  No acute thoracic findings. 2.  Old granulomatous disease. 3.  Old thoracic compression fractures and old sternal fracture. 4.  Atherosclerotic aortic arch.   Original Report Authenticated By: Dellia Cloud, M.D.     EKG: Independently reviewed. Sinus rhythm with T wave inversion at inferior and lateral leads. No ST elevations no prior EKG for comparison.  Pt reports prior history of MI in  56.  Assessment/Plan Principal Problem:  *Hyponatremia Active Problems:  HYPOTHYROIDISM  HYPERTENSION  CORONARY ARTERY DISEASE  Chronic systolic heart failure  URI (upper respiratory infection)   1. Hyponatremia - Place on med/surg bed.  Monitor BMP's q 4 hours to avoid over rapid correction - Most likely related to low sodium diet and poor oral intake at home.  As such low solute intake most likely causative factor.  Will gently hydrate given history of systolic CHF with NS 50cc/hr. - Obtain urine osm/sodium levels and serum osmolality  - Avoid correcting more than 10 meq sodium in 24 hour period  2. Chronic systolic HF - pt appears compensated - continue B blocker - gently hydrate - avoid nsaids  3. Hypothyroidism - Stable will continue home regimen  4. CAD - Continue home regimen of aspirin. Condition stable  5. URI - most likely viral - supportive therapy - assess for influenza  Code Status: full Family Communication: no family at bedside Disposition Plan: Most likely 1-2 days  Time spent: > 50 minutes  Penny Pia [facility] Pager [phone]  If 7PM-7AM, please contact night-coverage [email or web address] Password [email or web address] 12/29/2011, 2:17 PM

## 2011-12-29 NOTE — ED Notes (Signed)
Diet ordered for pt

## 2011-12-29 NOTE — Progress Notes (Signed)
CRITICAL VALUE ALERT  Critical value received:  Serum osmolality   Date of notification:  12/29/11   Time of notification:  2211  Critical value read back: yes   Nurse who received alert: Renessa Wellnitz, Joan Mayans   MD notified (1st page):  Bretta Bang   Time of first page:  2220  MD notified (2nd page):  Time of second page:  Responding MD:  Bretta Bang   Time MD responded:  2225  No changes made per MD.  Will continue to monitor patient.

## 2011-12-29 NOTE — ED Notes (Addendum)
Pt in c/o cough and intermittent fever over last 8 days, told to take tylenol and cough medication per PMD, pt in today due to increased fever last night and possible hallucinations when his fever was elevated. Pt last had tylenol for fever at 10pm yesterday.

## 2011-12-30 DIAGNOSIS — I5022 Chronic systolic (congestive) heart failure: Secondary | ICD-10-CM | POA: Diagnosis not present

## 2011-12-30 DIAGNOSIS — Z95810 Presence of automatic (implantable) cardiac defibrillator: Secondary | ICD-10-CM | POA: Diagnosis not present

## 2011-12-30 DIAGNOSIS — J069 Acute upper respiratory infection, unspecified: Secondary | ICD-10-CM | POA: Diagnosis not present

## 2011-12-30 DIAGNOSIS — E871 Hypo-osmolality and hyponatremia: Secondary | ICD-10-CM | POA: Diagnosis not present

## 2011-12-30 LAB — BASIC METABOLIC PANEL
BUN: 23 mg/dL (ref 6–23)
BUN: 22 mg/dL (ref 6–23)
BUN: 22 mg/dL (ref 6–23)
BUN: 23 mg/dL (ref 6–23)
BUN: 23 mg/dL (ref 6–23)
CO2: 22 mEq/L (ref 19–32)
CO2: 21 mEq/L (ref 19–32)
CO2: 24 mEq/L (ref 19–32)
Calcium: 8.3 mg/dL — ABNORMAL LOW (ref 8.4–10.5)
Chloride: 85 mEq/L — ABNORMAL LOW (ref 96–112)
Chloride: 84 mEq/L — ABNORMAL LOW (ref 96–112)
Chloride: 83 mEq/L — ABNORMAL LOW (ref 96–112)
Creatinine, Ser: 1.26 mg/dL (ref 0.50–1.35)
Creatinine, Ser: 1.26 mg/dL (ref 0.50–1.35)
Creatinine, Ser: 1.29 mg/dL (ref 0.50–1.35)
Creatinine, Ser: 1.38 mg/dL — ABNORMAL HIGH (ref 0.50–1.35)
Creatinine, Ser: 1.2 mg/dL (ref 0.50–1.35)
GFR calc non Af Amer: 53 mL/min — ABNORMAL LOW (ref >90)
Glucose, Bld: 86 mg/dL (ref 70–99)
Glucose, Bld: 111 mg/dL — ABNORMAL HIGH (ref 70–99)
Glucose, Bld: 98 mg/dL (ref 70–99)
Glucose, Bld: 116 mg/dL — ABNORMAL HIGH (ref 70–99)
Glucose, Bld: 105 mg/dL — ABNORMAL HIGH (ref 70–99)
Potassium: 4.3 mEq/L (ref 3.5–5.1)
Potassium: 4.3 mEq/L (ref 3.5–5.1)
Potassium: 4.3 mEq/L (ref 3.5–5.1)
Sodium: 117 mEq/L — CL (ref 135–145)

## 2011-12-30 LAB — CBC
HCT: 35 % — ABNORMAL LOW (ref 39.0–52.0)
Hemoglobin: 12.5 g/dL — ABNORMAL LOW (ref 13.0–17.0)
MCV: 85.2 fL (ref 78.0–100.0)
WBC: 10.7 10*3/uL — ABNORMAL HIGH (ref 4.0–10.5)

## 2011-12-30 MED ORDER — POLYVINYL ALCOHOL 1.4 % OP SOLN
2.0000 [drp] | OPHTHALMIC | Status: DC | PRN
  Filled 2011-12-30: qty 15

## 2011-12-30 MED ORDER — ALPRAZOLAM 0.25 MG PO TABS
0.2500 mg | ORAL_TABLET | Freq: Once | ORAL | Status: DC
  Filled 2011-12-30: qty 1

## 2011-12-30 NOTE — Progress Notes (Signed)
INITIAL ADULT NUTRITION ASSESSMENT Date: 12/30/2011   Time: 9:51 AM Reason for Assessment: Consult   INTERVENTION: 1. Encouraged continued PO intake  2. RD will continue to follow   DOCUMENTATION CODES Per approved criteria  -Not Applicable    ASSESSMENT: Male 78 y.o.  Dx: Hyponatremia  Hx:  Past Medical History  Diagnosis Date  . Hypertension   . Hypothyroid   . CAD (coronary artery disease)     Dr. Ladona Ridgel  . Osteoarthritis   . BPH (benign prostatic hypertrophy)     Dr. Vonita Moss  . Osteoporosis     (took actonel x 5y)  . Renal insufficiency     Dr. Merrily Brittle  . Hip fx     x 2 on R.  . Sternum fx 2011  . Urinary incontinence   . Hearing loss     Past Surgical History  Procedure Date  . Heart disease     permanent pacemaker  . Appendectomy   . Inguinal hernia repair   . Hip fracture surgery 2011    ORIF  R.  . Pacemaker insertion   . Colonoscopy 12/1998, 02/2004    diverticulosois, external hemorrhoids    Related Meds:     . ALPRAZolam  0.25 mg Oral Once  . aspirin EC  81 mg Oral Daily  . atorvastatin  10 mg Oral BID  . cholecalciferol  1,000 Units Oral Daily  . donepezil  10 mg Oral QHS  . heparin  5,000 Units Subcutaneous Q8H  . influenza  inactive virus vaccine  0.5 mL Intramuscular Tomorrow-1000  . levothyroxine  100 mcg Oral QAC breakfast  . metoprolol succinate  25 mg Oral Daily  . sodium chloride  1 g Oral Once  . DISCONTD: aspirin  81 mg Oral Daily     Ht: 5' 9.6" (176.8 cm)  Wt: 154 lb 5.2 oz (70 kg)  Ideal Wt: 73 kg  % Ideal Wt: 96%  Usual Wt:  Wt Readings from Last 5 Encounters:  12/29/11 154 lb 5.2 oz (70 kg)  11/22/11 157 lb 12 oz (71.555 kg)  08/09/11 154 lb (69.854 kg)  07/09/11 152 lb (68.947 kg)  07/01/11 157 lb 9.6 oz (71.487 kg)    % Usual Wt: ~100%  Body mass index is 22.40 kg/(m^2). Pt is WNL per current BMI   Food/Nutrition Related Hx: reports good appetite and no weight changes PTA   Labs:  CMP       Component Value Date/Time   NA 117* 12/30/2011 0530   K 4.3 12/30/2011 0530   CL 85* 12/30/2011 0530   CO2 22 12/30/2011 0530   GLUCOSE 86 12/30/2011 0530   BUN 23 12/30/2011 0530   CREATININE 1.26 12/30/2011 0530   CALCIUM 8.2* 12/30/2011 0530   PROT 6.9 12/29/2011 1019   ALBUMIN 3.7 12/29/2011 1019   AST 23 12/29/2011 1019   ALT 16 12/29/2011 1019   ALKPHOS 98 12/29/2011 1019   BILITOT 1.1 12/29/2011 1019   GFRNONAA 49* 12/30/2011 0530   GFRAA 57* 12/30/2011 0530      Intake/Output Summary (Last 24 hours) at 12/30/11 0953 Last data filed at 12/30/11 0700  Gross per 24 hour  Intake 1197.5 ml  Output    550 ml  Net  647.5 ml     Diet Order: General  Supplements/Tube Feeding: none   IVF:    sodium chloride Last Rate: 50 mL/hr at 12/29/11 1651    Estimated Nutritional Needs:   Kcal: 1610-9604 Protein: 70-80 gm  Fluid: 1.6-1.8 L   Admitted with hyponatremia, suspected r/t poor oral intake and low sodium diet. RD consulted for assessment of nutrition status.  Pt reports eating well, usually 2 meals daily with fluids (coffee/tea/water). No recent weight loss. Good appetite this morning, ate most of his breakfast.   NUTRITION DIAGNOSIS: -Altered nutrition-related laboratory values (specify) (NI-2.2).  Status: Ongoing  RELATED TO: decreased oral intake   AS EVIDENCE BY: Sodium: 117 (L)  MONITORING/EVALUATION(Goals): Goal: PO intake to continue to meet nutrition needs   Monitor: PO intake, weight, labs  EDUCATION NEEDS: -No education needs identified at this time    Clarene Duke RD, LDN Pager [phone] After Hours pager [phone]  12/30/2011, 9:51 AM

## 2011-12-30 NOTE — Progress Notes (Signed)
[facility] PROGRESS NOTE  NEMO SUDBURY ZOX:[id number] DOB: [birth year] DOA: 12/29/2011 PCP: Sonda Primes, MD  HPI/Subjective: Denies any specific complaints  Assessment/Plan:  Hyponatremia -Monitor BMP's q 4 hours to avoid over rapid correction  -Patient seems euvolemic, no evidence of lower extremity edema. -Low serum osmolality, urine osmole is more than 400 and has urine sodium of 58. -And this is consistent with impaired water excretion, likely SIADH -SIADH likely caused by his recent chest infection. -Check TSH, cortisol. I will continue the fluids at a slow rate, restrict fluids, check BMP regularly.  Chronic systolic HF  - pt appears compensated  - continue B blocker  - gently hydrate  - avoid nsaids   Hypothyroidism  - Stable will continue home regimen   CAD  - Continue home regimen of aspirin. Condition stable   URI  - most likely viral  - supportive therapy  - assess for influenza   Code Status: Full Family Communication:  Disposition Plan: Remains in patient   Consultants:  None  Procedures:  None  Antibiotics: None   Objective: Filed Vitals:   12/29/11 1541 12/29/11 1828 12/29/11 2042 12/30/11 0518  BP: 132/63 137/79 149/76 158/79  Pulse: 61 65 62 68  Temp: 97.8 F (36.6 C) 97.5 F (36.4 C) 97.5 F (36.4 C) 97.7 F (36.5 C)  TempSrc: Oral Oral Oral Oral  Resp: 18 18 18 17   Height:   5' 9.6" (1.768 m)   Weight:   70 kg (154 lb 5.2 oz)   SpO2: 98% 99% 99% 95%    Intake/Output Summary (Last 24 hours) at 12/30/11 1226 Last data filed at 12/30/11 0700  Gross per 24 hour  Intake 1197.5 ml  Output    550 ml  Net  647.5 ml   Filed Weights   12/29/11 2042  Weight: 70 kg (154 lb 5.2 oz)    Exam:  General: Alert and awake, oriented x3, not in any acute distress. HEENT: anicteric sclera, pupils reactive to light and accommodation, EOMI CVS: S1-S2 clear, no murmur rubs or gallops Chest: clear to auscultation bilaterally,  no wheezing, rales or rhonchi Abdomen: soft nontender, nondistended, normal bowel sounds, no organomegaly Extremities: no cyanosis, clubbing or edema noted bilaterally Neuro: Cranial nerves II-XII intact, no focal neurological deficits  Data Reviewed: Basic Metabolic Panel:  Lab 12/30/11 8119 12/30/11 0530 12/29/11 2154 12/29/11 1636 12/29/11 1019  NA 117* 117* 116* 116* 116*  K 4.3 4.3 4.6 4.2 4.5  CL 84* 85* 84* 83* 84*  CO2 22 22 25 24 23   GLUCOSE 111* 86 99 95 97  BUN 22 23 25* 19 18  CREATININE 1.26 1.26 1.42* 1.35 1.28  CALCIUM 8.8 8.2* 8.4 8.6 9.1  MG -- -- -- -- --  PHOS -- -- -- -- --   Liver Function Tests:  Lab 12/29/11 1019  AST 23  ALT 16  ALKPHOS 98  BILITOT 1.1  PROT 6.9  ALBUMIN 3.7   No results found for this basename: LIPASE:5,AMYLASE:5 in the last 168 hours No results found for this basename: AMMONIA:5 in the last 168 hours CBC:  Lab 12/30/11 0530 12/29/11 1019  WBC 10.7* 10.1  NEUTROABS -- 6.4  HGB 12.5* 13.8  HCT 35.0* 38.5*  MCV 85.2 85.7  PLT 162 199   Cardiac Enzymes: No results found for this basename: CKTOTAL:5,CKMB:5,CKMBINDEX:5,TROPONINI:5 in the last 168 hours BNP (last 3 results) No results found for this basename: PROBNP:3 in the last 8760 hours CBG: No results found for  this basename: GLUCAP:5 in the last 168 hours  No results found for this or any previous visit (from the past 240 hour(s)).   Studies: Dg Chest 2 View  12/29/2011  *RADIOLOGY REPORT*  Clinical Data: Cough and congestion.  Fever.  Hypertension.  CHEST - 2 VIEW  Comparison: 05/15/2011  Findings: AICD noted with unchanged lead positioning.  Atherosclerotic aortic arch noted.  Old granulomatous disease noted on the right.  Heart size is within normal limits.  Thoracic spondylosis noted with several chronic thoracic compression fractures.  Probable remote sternal fracture.  IMPRESSION:  1.  No acute thoracic findings. 2.  Old granulomatous disease. 3.  Old thoracic  compression fractures and old sternal fracture. 4.  Atherosclerotic aortic arch.   Original Report Authenticated By: Dellia Cloud, M.D.     Scheduled Meds:   . ALPRAZolam  0.25 mg Oral Once  . aspirin EC  81 mg Oral Daily  . atorvastatin  10 mg Oral BID  . cholecalciferol  1,000 Units Oral Daily  . donepezil  10 mg Oral QHS  . heparin  5,000 Units Subcutaneous Q8H  . influenza  inactive virus vaccine  0.5 mL Intramuscular Tomorrow-1000  . levothyroxine  100 mcg Oral QAC breakfast  . metoprolol succinate  25 mg Oral Daily  . sodium chloride  1 g Oral Once  . DISCONTD: aspirin  81 mg Oral Daily   Continuous Infusions:   . sodium chloride 50 mL/hr at 12/30/11 1225    Principal Problem:  *Hyponatremia Active Problems:  HYPOTHYROIDISM  HYPERTENSION  CORONARY ARTERY DISEASE  Chronic systolic heart failure  URI (upper respiratory infection)    Time spent: 35 minute    [facility] A  [facility] Pager [phone]. If 8PM-8AM, please contact night-coverage at [email or web address], password [email or web address] 12/30/2011, 12:26 PM  LOS: 1 day

## 2011-12-30 NOTE — Progress Notes (Signed)
CRITICAL VALUE ALERT  Critical value received:  Sodium 117  Date of notification:  12/30/11  Time of notification:  0750  Critical value read back:yes  Nurse who received alert:  Mauro Kaufmann  MD notified (1st page):  Ahmed Prima  Time of first page:  0750  MD notified (2nd page):  Time of second page:  Responding MD:  Arthor Captain  Time MD responded:  0800

## 2011-12-30 NOTE — Progress Notes (Signed)
Lab called and gave Na+ 116 critical value to charge nurse Macarthur Critchley; hospitalist Dr. Bretta Bang on the floor and stated to continue to monitor patient.

## 2011-12-31 ENCOUNTER — Encounter: Payer: Medicare Other | Admitting: Internal Medicine

## 2011-12-31 DIAGNOSIS — I5022 Chronic systolic (congestive) heart failure: Secondary | ICD-10-CM | POA: Diagnosis not present

## 2011-12-31 DIAGNOSIS — E871 Hypo-osmolality and hyponatremia: Secondary | ICD-10-CM | POA: Diagnosis not present

## 2011-12-31 DIAGNOSIS — I10 Essential (primary) hypertension: Secondary | ICD-10-CM | POA: Diagnosis not present

## 2011-12-31 DIAGNOSIS — J069 Acute upper respiratory infection, unspecified: Secondary | ICD-10-CM | POA: Diagnosis not present

## 2011-12-31 LAB — BASIC METABOLIC PANEL
BUN: 19 mg/dL (ref 6–23)
BUN: 20 mg/dL (ref 6–23)
BUN: 21 mg/dL (ref 6–23)
BUN: 23 mg/dL (ref 6–23)
BUN: 25 mg/dL — ABNORMAL HIGH (ref 6–23)
CO2: 23 mEq/L (ref 19–32)
CO2: 22 mEq/L (ref 19–32)
Calcium: 8 mg/dL — ABNORMAL LOW (ref 8.4–10.5)
Calcium: 8.7 mg/dL (ref 8.4–10.5)
Calcium: 9 mg/dL (ref 8.4–10.5)
Chloride: 89 mEq/L — ABNORMAL LOW (ref 96–112)
Chloride: 90 mEq/L — ABNORMAL LOW (ref 96–112)
Chloride: 91 mEq/L — ABNORMAL LOW (ref 96–112)
Creatinine, Ser: 1.11 mg/dL (ref 0.50–1.35)
Creatinine, Ser: 1.14 mg/dL (ref 0.50–1.35)
Creatinine, Ser: 1.25 mg/dL (ref 0.50–1.35)
GFR calc Af Amer: 58 mL/min — ABNORMAL LOW (ref >90)
GFR calc non Af Amer: 53 mL/min — ABNORMAL LOW (ref >90)
GFR calc non Af Amer: 37 mL/min — ABNORMAL LOW (ref >90)
Glucose, Bld: 87 mg/dL (ref 70–99)
Glucose, Bld: 90 mg/dL (ref 70–99)
Glucose, Bld: 99 mg/dL (ref 70–99)
Potassium: 4 mEq/L (ref 3.5–5.1)

## 2011-12-31 MED ORDER — FUROSEMIDE 10 MG/ML IJ SOLN
40.0000 mg | Freq: Once | INTRAMUSCULAR | Status: AC
  Administered 2011-12-31: 40 mg via INTRAVENOUS
  Filled 2011-12-31: qty 4

## 2011-12-31 NOTE — Evaluation (Signed)
Physical Therapy Evaluation Patient Details Name: [name] [name] MRN: [id number] DOB: 1923-02-10 Today's Date: 12/31/2011 Time: 1203-1221 PT Time Calculation (min): 18 min  PT Assessment / Plan / Recommendation Clinical Impression  Pt admitted with hyponatremia and aching. Pt lives alone with dgtr nearby and lots of family support. Pt with balance deficits with gait and recommend RW at all times for safety and to maximize independence. Will follow acutely to maximize mobility, balance and gait. Discussed with pt the option of HHPT but he denied any therapy outside acute but is agreeable to using RW. Will follow,     PT Assessment  Patient needs continued PT services    Follow Up Recommendations  Supervision - Intermittent;Other (comment) (RW for mobility)    Does the patient have the potential to tolerate intense rehabilitation      Barriers to Discharge Decreased caregiver support      Equipment Recommendations  None recommended by PT    Recommendations for Other Services     Frequency Min 3X/week    Precautions / Restrictions Precautions Precautions: Fall   Pertinent Vitals/Pain No pain     Mobility  Bed Mobility Bed Mobility: Supine to Sit Supine to Sit: With rails;HOB flat;6: Modified independent (Device/Increase time) Transfers Transfers: Sit to Stand;Stand to Sit Sit to Stand: 6: Modified independent (Device/Increase time);From bed Stand to Sit: 6: Modified independent (Device/Increase time);With armrests;To chair/3-in-1 Ambulation/Gait Ambulation/Gait Assistance: 4: Min guard Ambulation Distance (Feet): 300 Feet Assistive device: None Ambulation/Gait Assistance Details: Pt with antalgic gait and one LOB with ambulation with min assist to correct Gait Pattern: Step-through pattern;Antalgic;Trunk flexed;Decreased stride length Gait velocity: decreased Stairs: Yes Stairs Assistance: 6: Modified independent (Device/Increase time) Stair Management Technique: One  rail Left;Step to pattern Number of Stairs: 4     Shoulder Instructions     Exercises General Exercises - Lower Extremity Long Arc Quad: AROM;Both;10 reps;Seated Hip Flexion/Marching: AROM;Both;10 reps;Seated   PT Diagnosis: Difficulty walking  PT Problem List: Decreased activity tolerance;Decreased balance;Decreased knowledge of use of DME PT Treatment Interventions: Gait training;DME instruction;Functional mobility training;Therapeutic activities;Therapeutic exercise;Balance training;Patient/family education   PT Goals Acute Rehab PT Goals PT Goal Formulation: With patient Time For Goal Achievement: 01/14/12 Potential to Achieve Goals: Good Pt will Ambulate: >150 feet;with modified independence;with least restrictive assistive device PT Goal: Ambulate - Progress: Goal set today Additional Goals Additional Goal #1: Pt will complete Berg to further assess balance deficits PT Goal: Additional Goal #1 - Progress: Goal set today  Visit Information  Last PT Received On: 12/31/11 Assistance Needed: +1    Subjective Data  Subjective: I had therapy after my two broken hips Patient Stated Goal: return home   Prior Functioning  Home Living Lives With: Alone Available Help at Discharge: Family;Available PRN/intermittently Type of Home: House Home Access: Stairs to enter [employer] of Steps: 4 Entrance Stairs-Rails: Right;Left Home Layout: One level Bathroom Shower/Tub: Engineer, manufacturing systems: Standard Home Adaptive Equipment: Bedside commode/3-in-1;Walker - rolling;Walker - four wheeled;Wheelchair - powered;Shower chair with back Prior Function Level of Independence: Independent Able to Take Stairs?: Yes Driving: Yes Vocation: Retired Comments: Pt states dgtr lives next door and they share yard work and that he goes to [employer] every morning for breakfast in his golf cart. Pt has power WC from wife who passed in Dec.  Communication Communication: No  difficulties    Cognition  Overall Cognitive Status: Appears within functional limits for tasks assessed/performed Arousal/Alertness: Awake/alert Orientation Level: Appears intact for tasks assessed Behavior During Session:  WFL for tasks performed    Extremity/Trunk Assessment Right Lower Extremity Assessment RLE ROM/Strength/Tone: [facility] for tasks assessed Left Lower Extremity Assessment LLE ROM/Strength/Tone: [facility] for tasks assessed Trunk Assessment Trunk Assessment: Other exceptions;Kyphotic (forward head)   Balance    End of Session PT - End of Session Equipment Utilized During Treatment: Gait belt Activity Tolerance: Patient tolerated treatment well Patient left: in chair;with call bell/phone within reach Nurse Communication: Mobility status  GP Functional Assessment Tool Used: clnical judgement Functional Limitation: Mobility: Walking and moving around Mobility: Walking and Moving Around Current Status (J1914): At least 1 percent but less than 20 percent impaired, limited or restricted Mobility: Walking and Moving Around Goal Status [phone]): 0 percent impaired, limited or restricted   Delorse Lek 12/31/2011, 1:11 PM  Delaney Meigs, PT [phone]

## 2011-12-31 NOTE — Progress Notes (Signed)
[facility] PROGRESS NOTE  [name] [name] ZOX:[id number] DOB: [birth year] DOA: 12/29/2011 PCP: Sonda Primes, MD  HPI/Subjective: Denies any specific complaints  Assessment/Plan:  Hyponatremia -Monitor BMP's q 4 hours to avoid over rapid correction  -Patient seems euvolemic, no evidence of lower extremity edema. -Low serum osmolality, urine osmolality is more than 400 and has urine sodium of 58. 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 Has normal cortisol and TSH levels -I will continue the fluids at a slow rate, restrict fluids, check BMP regularly. -Sodium slowly improving, give IV Lasix today. If no improvement can try Samsca in AM.  Chronic systolic HF  -This is appear to be compensated. -Continue Toprol-XL  Hypothyroidism  - Stable will continue home regimen, TSH is normal   CAD  -Continue home regimen of aspirin. Condition stable   URI  -Likely viral URI, patient improved and denies any current symptoms. -This is presumed to be the cause of his SIADH.   Code Status: Full Family Communication:  Disposition Plan: Remains in patient   Consultants:  None  Procedures:  None  Antibiotics: None   Objective: Filed Vitals:   12/30/11 0518 12/30/11 1401 12/30/11 2052 12/31/11 0555  BP: 158/79 109/63 147/71 134/76  Pulse: 68 60 60 60  Temp: 97.7 F (36.5 C) 97.7 F (36.5 C) 98.8 F (37.1 C) 97.5 F (36.4 C)  TempSrc: Oral Oral Oral Oral  Resp: 17 18 18 16   Height:      Weight:      SpO2: 95% 96% 95% 97%    Intake/Output Summary (Last 24 hours) at 12/31/11 0954 Last data filed at 12/31/11 0900  Gross per 24 hour  Intake 1781.33 ml  Output   1925 ml  Net -143.67 ml   Filed Weights   12/29/11 2042  Weight: 70 kg (154 lb 5.2 oz)    Exam:  General: Alert and awake, oriented x3, not in any acute distress. HEENT: anicteric sclera, pupils reactive to light and  accommodation, EOMI CVS: S1-S2 clear, no murmur rubs or gallops Chest: clear to auscultation bilaterally, no wheezing, rales or rhonchi Abdomen: soft nontender, nondistended, normal bowel sounds, no organomegaly Extremities: no cyanosis, clubbing or edema noted bilaterally Neuro: Cranial nerves II-XII intact, no focal neurological deficits  Data Reviewed: Basic Metabolic Panel:  Lab 12/31/11 8119 12/31/11 0403 12/31/11 0001 12/30/11 2105 12/30/11 1553  NA 121* 122* 119* 120* 117*  K 4.1 4.0 4.4 4.3 4.5  CL 90* 91* 89* 89* 85*  CO2 23 22 22 21 24   GLUCOSE 84 87 94 105* 116*  BUN 19 21 23 23 23   CREATININE 1.14 1.20 1.25 1.20 1.38*  CALCIUM 8.3* 8.3* 8.0* 8.3* 8.3*  MG -- -- -- -- --  PHOS -- -- -- -- --   Liver Function Tests:  Lab 12/29/11 1019  AST 23  ALT 16  ALKPHOS 98  BILITOT 1.1  PROT 6.9  ALBUMIN 3.7   No results found for this basename: LIPASE:5,AMYLASE:5 in the last 168 hours No results found for this basename: AMMONIA:5 in the last 168 hours CBC:  Lab 12/30/11 0530 12/29/11 1019  WBC 10.7* 10.1  NEUTROABS -- 6.4  HGB 12.5* 13.8  HCT 35.0* 38.5*  MCV 85.2 85.7  PLT 162 199   Cardiac Enzymes: No results found for this basename: CKTOTAL:5,CKMB:5,CKMBINDEX:5,TROPONINI:5 in the last 168 hours BNP (last 3 results) No results found for this basename: PROBNP:3 in the last 8760  hours CBG: No results found for this basename: GLUCAP:5 in the last 168 hours  No results found for this or any previous visit (from the past 240 hour(s)).   Studies: Dg Chest 2 View  12/29/2011  *RADIOLOGY REPORT*  Clinical Data: Cough and congestion.  Fever.  Hypertension.  CHEST - 2 VIEW  Comparison: 05/15/2011  Findings: AICD noted with unchanged lead positioning.  Atherosclerotic aortic arch noted.  Old granulomatous disease noted on the right.  Heart size is within normal limits.  Thoracic spondylosis noted with several chronic thoracic compression fractures.  Probable remote  sternal fracture.  IMPRESSION:  1.  No acute thoracic findings. 2.  Old granulomatous disease. 3.  Old thoracic compression fractures and old sternal fracture. 4.  Atherosclerotic aortic arch.   Original Report Authenticated By: Dellia Cloud, M.D.     Scheduled Meds:    . ALPRAZolam  0.25 mg Oral Once  . aspirin EC  81 mg Oral Daily  . atorvastatin  10 mg Oral BID  . cholecalciferol  1,000 Units Oral Daily  . donepezil  10 mg Oral QHS  . heparin  5,000 Units Subcutaneous Q8H  . influenza  inactive virus vaccine  0.5 mL Intramuscular Tomorrow-1000  . levothyroxine  100 mcg Oral QAC breakfast  . metoprolol succinate  25 mg Oral Daily   Continuous Infusions:    . sodium chloride 75 mL/hr at 12/30/11 1316    Principal Problem:  *Hyponatremia Active Problems:  HYPOTHYROIDISM  HYPERTENSION  CORONARY ARTERY DISEASE  Chronic systolic heart failure  URI (upper respiratory infection)    Time spent: 35 minute    [facility] A  [facility] Pager [phone]. If 8PM-8AM, please contact night-coverage at [email or web address], password [facility] 12/31/2011, 9:54 AM  LOS: 2 days

## 2011-12-31 NOTE — Progress Notes (Signed)
CRITICAL VALUE ALERT  Critical value received:  Na 119  Date of notification:  12/31/2011   Time of notification: 12:59 AM   Critical value read back:yes  Nurse who received alert:  Kennyth Arnold, RN  MD notified (1st page):  Schorr, NP  Time of first page:  12:59 AM   MD notified (2nd page):  Time of second page:  Responding MD:    Time MD responded:

## 2011-12-31 NOTE — Care Management Note (Addendum)
    Page 1 of 2   01/02/2012     2:15:32 PM   CARE MANAGEMENT NOTE 01/02/2012  Patient:  [name] [name], [name] [name]   Account Number:  [id number]  Date Initiated:  12/31/2011  Documentation initiated by:  Letha Cape  Subjective/Objective Assessment:   dx hyponatremia  siadh  admit- lives alone.     Action/Plan:   pt eval- rec rolling walker, pt has refused any other pt but will agree to use rolling walker.  pt has decided to have hhpt.   Anticipated DC Date:  01/02/2012   Anticipated DC Plan:  HOME W HOME HEALTH SERVICES      DC Planning Services  CM consult      [facility] Choice  HOME HEALTH   Choice offered to / List presented to:  C-1 Patient   DME arranged  Levan Hurst      DME agency  [facility]     HH arranged  HH-2 PT      [employer] agency  [facility]   Status of service:  Completed, signed off Medicare Important Message given?   (If response is "NO", the following Medicare IM given date fields will be blank) Date Medicare IM given:   Date Additional Medicare IM given:    Discharge Disposition:  HOME W HOME HEALTH SERVICES  Per UR Regulation:  Reviewed for med. necessity/level of care/duration of stay  If discussed at Long Length of Stay Meetings, dates discussed:    Comments:  01/02/12 14:14 Letha Cape RN, BSN [phone] pt dc to home with [facility] services.  01/01/12 10:26 Letha Cape RN, BSN [phone] patient lives alone, patient has decided to have hhpt, he chose [facility], referral made to [facility], Marie notified.  Justin notified for rolling walker as well.  Patient for dc today. Soc will begin 24-48 hrs post discharge.  12/31/11 17:25 Letha Cape RN, BSN [phone] pt lives alone, per physical therapy would like for pt to have some pt at home but patient refused to have any other pt than whats done in the hospital, but is agreeable to a rolling walker, order in for rolling walker.  NCM will continue to follow for dc needs.

## 2012-01-01 DIAGNOSIS — I2510 Atherosclerotic heart disease of native coronary artery without angina pectoris: Secondary | ICD-10-CM | POA: Diagnosis present

## 2012-01-01 DIAGNOSIS — Z23 Encounter for immunization: Secondary | ICD-10-CM | POA: Diagnosis not present

## 2012-01-01 DIAGNOSIS — N400 Enlarged prostate without lower urinary tract symptoms: Secondary | ICD-10-CM | POA: Diagnosis present

## 2012-01-01 DIAGNOSIS — J069 Acute upper respiratory infection, unspecified: Secondary | ICD-10-CM | POA: Diagnosis present

## 2012-01-01 DIAGNOSIS — E871 Hypo-osmolality and hyponatremia: Secondary | ICD-10-CM | POA: Diagnosis not present

## 2012-01-01 DIAGNOSIS — R52 Pain, unspecified: Secondary | ICD-10-CM | POA: Diagnosis not present

## 2012-01-01 DIAGNOSIS — Z7982 Long term (current) use of aspirin: Secondary | ICD-10-CM | POA: Diagnosis not present

## 2012-01-01 DIAGNOSIS — I10 Essential (primary) hypertension: Secondary | ICD-10-CM | POA: Diagnosis not present

## 2012-01-01 DIAGNOSIS — M810 Age-related osteoporosis without current pathological fracture: Secondary | ICD-10-CM | POA: Diagnosis present

## 2012-01-01 DIAGNOSIS — M1990 Unspecified osteoarthritis, unspecified site: Secondary | ICD-10-CM | POA: Diagnosis present

## 2012-01-01 DIAGNOSIS — Z79899 Other long term (current) drug therapy: Secondary | ICD-10-CM | POA: Diagnosis not present

## 2012-01-01 DIAGNOSIS — Z87891 Personal history of nicotine dependence: Secondary | ICD-10-CM | POA: Diagnosis not present

## 2012-01-01 DIAGNOSIS — E236 Other disorders of pituitary gland: Secondary | ICD-10-CM | POA: Diagnosis present

## 2012-01-01 DIAGNOSIS — E039 Hypothyroidism, unspecified: Secondary | ICD-10-CM | POA: Diagnosis not present

## 2012-01-01 DIAGNOSIS — B9789 Other viral agents as the cause of diseases classified elsewhere: Secondary | ICD-10-CM | POA: Diagnosis present

## 2012-01-01 DIAGNOSIS — Z950 Presence of cardiac pacemaker: Secondary | ICD-10-CM | POA: Diagnosis not present

## 2012-01-01 DIAGNOSIS — H9190 Unspecified hearing loss, unspecified ear: Secondary | ICD-10-CM | POA: Diagnosis present

## 2012-01-01 DIAGNOSIS — I5022 Chronic systolic (congestive) heart failure: Secondary | ICD-10-CM | POA: Diagnosis not present

## 2012-01-01 LAB — BASIC METABOLIC PANEL
BUN: 26 mg/dL — ABNORMAL HIGH (ref 6–23)
Chloride: 95 mEq/L — ABNORMAL LOW (ref 96–112)
Creatinine, Ser: 1.52 mg/dL — ABNORMAL HIGH (ref 0.50–1.35)
GFR calc Af Amer: 46 mL/min — ABNORMAL LOW (ref >90)
GFR calc non Af Amer: 39 mL/min — ABNORMAL LOW (ref >90)

## 2012-01-01 MED ORDER — FUROSEMIDE 40 MG PO TABS
40.0000 mg | ORAL_TABLET | Freq: Every day | ORAL | Status: DC
  Administered 2012-01-01 – 2012-01-02 (×2): 40 mg via ORAL
  Filled 2012-01-01 (×2): qty 1

## 2012-01-01 MED ORDER — METOPROLOL SUCCINATE 12.5 MG HALF TABLET
12.5000 mg | ORAL_TABLET | Freq: Every day | ORAL | Status: DC
  Administered 2012-01-02: 12.5 mg via ORAL
  Filled 2012-01-01: qty 1

## 2012-01-01 NOTE — Progress Notes (Signed)
Occupational Therapy Evaluation Patient Details Name: [name] [name] MRN: [id number] DOB: [birth year] Today's Date: 01/01/2012 Time: 7829-5621 OT Time Calculation (min): 16 min  OT Assessment / Plan / Recommendation Clinical Impression  Pleasant 78 yo with recent admission for hyponatremia. Pt lives alone and is approriate to return home to live independently with intermittent S of daughter. Rec to pt to use showerseat that he has and to install grab bars in the shower. Discussed home safety. No further Ot needs.    OT Assessment  Patient does not need any further OT services    Follow Up Recommendations  No OT follow up    Barriers to Discharge  none    Equipment Recommendations  None recommended by OT    Recommendations for Other Services    Frequency    eval only   Precautions / Restrictions Precautions Precautions: None   Pertinent Vitals/Pain No c/o pain    ADL  Grooming: Performed;Modified independent Where Assessed - Grooming: Unsupported standing Upper Body Bathing: Simulated;Modified independent Where Assessed - Upper Body Bathing: Unsupported sitting Lower Body Bathing: Simulated;Set up Where Assessed - Lower Body Bathing: Unsupported sit to stand Upper Body Dressing: Performed;Set up Where Assessed - Upper Body Dressing: Unsupported sitting Lower Body Dressing: Performed;Set up Where Assessed - Lower Body Dressing: Unsupported sit to stand Toilet Transfer: Performed;Supervision/safety Toilet Transfer Method: Sit to stand;Stand pivot Toilet Transfer Equipment: Comfort height toilet Toileting - Clothing Manipulation and Hygiene: Performed;Modified independent Where Assessed - Toileting Clothing Manipulation and Hygiene: Standing Equipment Used: Gait belt Transfers/Ambulation Related to ADLs: mod I  ADL Comments: OVERALL MOD i TO s.     OT Diagnosis:    OT Problem List:   OT Treatment Interventions:     OT Goals Acute Rehab OT Goals OT Goal Formulation:   (eval only)  Visit Information  Last OT Received On: 01/01/12 Assistance Needed: +1    Subjective Data      Prior Functioning     Home Living Lives With: Alone Available Help at Discharge: Family;Available PRN/intermittently Type of Home: House Home Access: Stairs to enter [employer] of Steps: 4 Entrance Stairs-Rails: Right;Left Home Layout: One level Bathroom Shower/Tub: Engineer, manufacturing systems: Standard Bathroom Accessibility: Yes How Accessible: Accessible via walker Home Adaptive Equipment: Bedside commode/3-in-1;Walker - rolling;Walker - four wheeled;Wheelchair - powered;Shower chair with back Prior Function Level of Independence: Independent Able to Take Stairs?: Yes Driving: Yes Vocation: Retired Comments: uses a cane at times Communication Communication: No difficulties Dominant Hand: Right         Vision/Perception     Cognition  Overall Cognitive Status: Appears within functional limits for tasks assessed/performed Arousal/Alertness: Awake/alert Orientation Level: Appears intact for tasks assessed Behavior During Session: [name]    Extremity/Trunk Assessment Right Upper Extremity Assessment RUE ROM/Strength/Tone: Within functional levels Left Upper Extremity Assessment LUE ROM/Strength/Tone: Within functional levels Right Lower Extremity Assessment RLE ROM/Strength/Tone: WFL for tasks assessed Left Lower Extremity Assessment LLE ROM/Strength/Tone: [facility] for tasks assessed Trunk Assessment Trunk Assessment: Kyphotic     Mobility Bed Mobility Bed Mobility: Supine to Sit Supine to Sit: 6: Modified independent (Device/Increase time);HOB flat Transfers Transfers: Sit to Stand;Stand to Sit Sit to Stand: 6: Modified independent (Device/Increase time);With upper extremity assist;From bed Stand to Sit: 6: Modified independent (Device/Increase time);To chair/3-in-1;With upper extremity assist Details for Transfer  Assistance: GOOD SAFETY/JUDGEMENT     Shoulder Instructions     Exercise     Balance Balance Balance Assessed:  (wfL FOR  adl)   End of Session OT - End of Session Equipment Utilized During Treatment: Gait belt Activity Tolerance: Patient tolerated treatment well Patient left: in chair;with call bell/phone within reach Nurse Communication: Mobility status  GO     [name] [name],HILLARY 01/01/2012, 4:00 PM [facility], OTR/L  [phone] 01/01/2012

## 2012-01-01 NOTE — Progress Notes (Signed)
PATIENT DETAILS Name: [name] [name] Age: 78 y.o. Sex: male Date of Birth: [birth year] Admit Date: 12/29/2011 Admitting Physician Penny Pia, MD JYN:WGNF Plotnikov, MD  Subjective:   Assessment/Plan: Principal Problem:  *Hyponatremia -likely 2/2 SIADH -even though has h/o CHF- is euvolemic clinically - start lasix -stop IVF -fluid restriction  Active Problems:  HYPOTHYROIDISM -TSH within normal limits -c/w Levothyroxine   HYPERTENSION -controlled with Metoprolol   CORONARY ARTERY DISEASE -c/w ASA, statins, lopressor   Chronic systolic heart failure -compensated -clinically compensated -EF per echo in 1007-45-50 %   URI (upper respiratory infection) -stable -seems to have resolved  Disposition: Remain inpatient  DVT Prophylaxis: SCD's  Code Status: Full code   Procedures:  None  CONSULTS:  None  PHYSICAL EXAM: Vital signs in last 24 hours: Filed Vitals:   12/31/11 1356 12/31/11 2200 01/01/12 0600 01/01/12 0908  BP: 105/67 106/61 145/76 135/75  Pulse: 60 61 66 60  Temp: 97.5 F (36.4 C) 97.5 F (36.4 C) 97.5 F (36.4 C) 97.6 F (36.4 C)  TempSrc: Oral Oral Oral Oral  Resp: 18   18  Height:      Weight:      SpO2: 99% 94% 97% 98%    Weight change:  Body mass index is 22.40 kg/(m^2).   Gen Exam: Awake and alert with clear speech.   Neck: Supple, No JVD.  Chest: B/L Clear.   CVS: S1 S2 Regular, no murmurs.  Abdomen: soft, BS +, non tender, non distended.  Extremities: no edema, lower extremities warm to touch. Neurologic: Non Focal.   Skin: No Rash.   Wounds: N/A.    Intake/Output from previous day:  Intake/Output Summary (Last 24 hours) at 01/01/12 1121 Last data filed at 01/01/12 0910  Gross per 24 hour  Intake   1465 ml  Output   2550 ml  Net  -1085 ml     LAB RESULTS: CBC  Lab 12/30/11 0530 12/29/11 1019  WBC 10.7* 10.1  HGB 12.5* 13.8  HCT 35.0* 38.5*  PLT 162 199  MCV 85.2 85.7  MCH 30.4 30.7  MCHC 35.7  35.8  RDW 12.6 12.5  LYMPHSABS -- 1.4  MONOABS -- 1.9*  EOSABS -- 0.4  BASOSABS -- 0.1  BANDABS -- --    Chemistries   Lab 01/01/12 0500 12/31/11 2057 12/31/11 1304 12/31/11 0757 12/31/11 0403  NA 127* 124* 122* 121* 122*  K 4.1 3.7 3.9 4.1 4.0  CL 95* 88* 89* 90* 91*  CO2 22 25 22 23 22   GLUCOSE 85 99 90 84 87  BUN 26* 25* 20 19 21   CREATININE 1.52* 1.59* 1.11 1.14 1.20  CALCIUM 8.5 8.7 9.0 8.3* 8.3*  MG -- -- -- -- --    CBG: No results found for this basename: GLUCAP:5 in the last 168 hours  GFR Estimated Creatinine Clearance: 33.9 ml/min (by C-G formula based on Cr of 1.52).  Coagulation profile No results found for this basename: INR:5,PROTIME:5 in the last 168 hours  Cardiac Enzymes No results found for this basename: CK:3,CKMB:3,TROPONINI:3,MYOGLOBIN:3 in the last 168 hours  No components found with this basename: POCBNP:3 No results found for this basename: DDIMER:2 in the last 72 hours No results found for this basename: HGBA1C:2 in the last 72 hours No results found for this basename: CHOL:2,HDL:2,LDLCALC:2,TRIG:2,CHOLHDL:2,LDLDIRECT:2 in the last 72 hours  Basename 12/30/11 1553  TSH 4.012  T4TOTAL --  T3FREE --  THYROIDAB --   No results found for this basename: VITAMINB12:2,FOLATE:2,FERRITIN:2,TIBC:2,IRON:2,RETICCTPCT:2 in the  last 72 hours No results found for this basename: LIPASE:2,AMYLASE:2 in the last 72 hours  Urine Studies No results found for this basename: UACOL:2,UAPR:2,USPG:2,UPH:2,UTP:2,UGL:2,UKET:2,UBIL:2,UHGB:2,UNIT:2,UROB:2,ULEU:2,UEPI:2,UWBC:2,URBC:2,UBAC:2,CAST:2,CRYS:2,UCOM:2,BILUA:2 in the last 72 hours  MICROBIOLOGY: No results found for this or any previous visit (from the past 240 hour(s)).  RADIOLOGY STUDIES/RESULTS: Dg Chest 2 View  12/29/2011  *RADIOLOGY REPORT*  Clinical Data: Cough and congestion.  Fever.  Hypertension.  CHEST - 2 VIEW  Comparison: 05/15/2011  Findings: AICD noted with unchanged lead positioning.   Atherosclerotic aortic arch noted.  Old granulomatous disease noted on the right.  Heart size is within normal limits.  Thoracic spondylosis noted with several chronic thoracic compression fractures.  Probable remote sternal fracture.  IMPRESSION:  1.  No acute thoracic findings. 2.  Old granulomatous disease. 3.  Old thoracic compression fractures and old sternal fracture. 4.  Atherosclerotic aortic arch.   Original Report Authenticated By: Dellia Cloud, M.D.     MEDICATIONS: Scheduled Meds:   . ALPRAZolam  0.25 mg Oral Once  . aspirin EC  81 mg Oral Daily  . atorvastatin  10 mg Oral BID  . cholecalciferol  1,000 Units Oral Daily  . donepezil  10 mg Oral QHS  . furosemide  40 mg Oral Daily  . heparin  5,000 Units Subcutaneous Q8H  . levothyroxine  100 mcg Oral QAC breakfast  . metoprolol succinate  25 mg Oral Daily   Continuous Infusions:   . DISCONTD: sodium chloride 75 mL/hr at 01/01/12 0500   PRN Meds:.acetaminophen, acetaminophen, docusate sodium, nitroGLYCERIN, ondansetron (ZOFRAN) IV, ondansetron, polyvinyl alcohol  Antibiotics: Anti-infectives    None       Jeoffrey Massed, MD  [facility] Pager:[phone]  If 7PM-7AM, please contact night-coverage [email or web address] Password [email or web address] 01/01/2012, 11:21 AM   LOS: 3 days

## 2012-01-01 NOTE — Progress Notes (Signed)
During change of shift report, found prescription eye drops on patient's table.  Pt stated "My doctor prescribed this to me because of allergies.  I take it once a day, one drop in each eye."  Pt and family advised not to use medication until it has been verified by their attending physician and pharmacy.  The eye drops are Bepreve 1.5%.

## 2012-01-02 ENCOUNTER — Telehealth: Payer: Self-pay | Admitting: Internal Medicine

## 2012-01-02 DIAGNOSIS — E871 Hypo-osmolality and hyponatremia: Secondary | ICD-10-CM | POA: Diagnosis not present

## 2012-01-02 DIAGNOSIS — E039 Hypothyroidism, unspecified: Secondary | ICD-10-CM | POA: Diagnosis not present

## 2012-01-02 DIAGNOSIS — I5022 Chronic systolic (congestive) heart failure: Secondary | ICD-10-CM | POA: Diagnosis not present

## 2012-01-02 DIAGNOSIS — I10 Essential (primary) hypertension: Secondary | ICD-10-CM | POA: Diagnosis not present

## 2012-01-02 LAB — BASIC METABOLIC PANEL
BUN: 31 mg/dL — ABNORMAL HIGH (ref 6–23)
CO2: 20 mEq/L (ref 19–32)
Chloride: 95 mEq/L — ABNORMAL LOW (ref 96–112)
Creatinine, Ser: 1.49 mg/dL — ABNORMAL HIGH (ref 0.50–1.35)
Glucose, Bld: 83 mg/dL (ref 70–99)

## 2012-01-02 MED ORDER — FUROSEMIDE 40 MG PO TABS: 40.0000 mg | ORAL_TABLET | Freq: Every day | ORAL | Status: DC

## 2012-01-02 MED ORDER — METOPROLOL SUCCINATE ER 25 MG PO TB24: 12.5000 mg | ORAL_TABLET | Freq: Every day | ORAL | Status: DC

## 2012-01-02 MED ORDER — POTASSIUM CHLORIDE ER 10 MEQ PO TBCR: 10.0000 meq | EXTENDED_RELEASE_TABLET | Freq: Two times a day (BID) | ORAL | Status: DC

## 2012-01-02 NOTE — Progress Notes (Signed)
Physical Therapy Treatment Patient Details Name: [name] [name] MRN: [id number] DOB: 1923/01/07 Today's Date: 01/02/2012 Time: 4098-1191 PT Time Calculation (min): 16 min  PT Assessment / Plan / Recommendation Comments on Treatment Session  Pt admitted for hyponatremia moving well with ambulation and activity today with RW. Pt with improved gait, endurance and speed with use of RW today. Pt with no concerns reguarding returning home and encouraged to continue use of RW. Will follow.     Follow Up Recommendations  No PT follow up;Supervision - Intermittent;Other (comment) (RW for ambulation- pt has rollator)     Does the patient have the potential to tolerate intense rehabilitation     Barriers to Discharge        Equipment Recommendations  None recommended by PT    Recommendations for Other Services    Frequency     Plan Discharge plan remains appropriate    Precautions / Restrictions Precautions Precautions: Fall   Pertinent Vitals/Pain No pain    Mobility  Bed Mobility Supine to Sit: 6: Modified independent (Device/Increase time);HOB flat Transfers Transfers: Stand Pivot Transfers Sit to Stand: 6: Modified independent (Device/Increase time);From chair/3-in-1;From bed Stand to Sit: 6: Modified independent (Device/Increase time);To chair/3-in-1;With armrests Stand Pivot Transfers: 6: Modified independent (Device/Increase time) Ambulation/Gait Ambulation/Gait Assistance: 6: Modified independent (Device/Increase time) Ambulation Distance (Feet): 1000 Feet Assistive device: Rolling walker Ambulation/Gait Assistance Details: initial cueing for position in RW and direction to room but otherwise pt ambulating safely without assist with increased gait speed compared to no AD Gait Pattern: Step-through pattern;Trunk flexed Gait velocity: WFL Stairs: No    Exercises General Exercises - Lower Extremity Long Arc Quad: AROM;Both;20 reps;Seated Hip Flexion/Marching: AROM;Both;20  reps;Seated   PT Diagnosis:    PT Problem List:   PT Treatment Interventions:     PT Goals Acute Rehab PT Goals PT Goal: Ambulate - Progress: Met  Visit Information  Last PT Received On: 01/02/12 Assistance Needed: +1    Subjective Data  Subjective: I didn't walk out of the room yesterday   Cognition  Overall Cognitive Status: Appears within functional limits for tasks assessed/performed Arousal/Alertness: Awake/alert Orientation Level: Appears intact for tasks assessed Behavior During Session: [facility] for tasks performed    Balance     End of Session PT - End of Session Activity Tolerance: Patient tolerated treatment well Patient left: in chair;with call bell/phone within reach Nurse Communication: Mobility status   GP     Toney Sang Beth 01/02/2012, 10:32 AM Delaney Meigs, PT [phone]

## 2012-01-02 NOTE — Telephone Encounter (Signed)
Patient called back and scheduled apt

## 2012-01-02 NOTE — Telephone Encounter (Signed)
Called patient on only number listed to schedule, lvmom.

## 2012-01-02 NOTE — Telephone Encounter (Signed)
Work in w/me tomorrow or pls sch w/another MD next week Thx

## 2012-01-02 NOTE — Discharge Summary (Signed)
PNoneNoneATIENT DETAILS Name: [name] [name] Age: 78 y.o. Sex: male Date of Birth: December 21, 1922 MRN: [id number]. Admit Date: 12/29/2011 Admitting Physician: Penny Pia, MD NWG:NFAO Plotnikov, MD  Recommendations for Outpatient Follow-up:  1. Follow Na and other lytes at next visit 2. If Na persistently low or severe hyponatremia recurs-may need work up for malignancy  PRIMARY DISCHARGE DIAGNOSIS:  Principal Problem:  *Hyponatremia Active Problems:  HYPOTHYROIDISM  HYPERTENSION  CORONARY ARTERY DISEASE  Chronic systolic heart failure  URI (upper respiratory infection)      PAST MEDICAL HISTORY: Past Medical History  Diagnosis Date  . Hypertension   . Hypothyroid   . CAD (coronary artery disease)     Dr. Ladona Ridgel  . Osteoarthritis   . BPH (benign prostatic hypertrophy)     Dr. Vonita Moss  . Osteoporosis     (took actonel x 5y)  . Renal insufficiency     Dr. Merrily Brittle  . Hip fx     x 2 on R.  . Sternum fx 2011  . Urinary incontinence   . Hearing loss     DISCHARGE MEDICATIONS:   Medication List     As of 01/02/2012 10:47 AM    TAKE these medications         aspirin 81 MG EC tablet   Take 81 mg by mouth daily.      atorvastatin 20 MG tablet   Commonly known as: LIPITOR   Take 10 mg by mouth 2 (two) times daily.      cholecalciferol 1000 UNITS tablet   Commonly known as: VITAMIN D   Take 1,000 Units by mouth daily.      donepezil 10 MG tablet   Commonly known as: ARICEPT   Take 10 mg by mouth at bedtime.      folic acid 800 MCG tablet   Commonly known as: FOLVITE   Take 800 mcg by mouth daily.      furosemide 40 MG tablet   Commonly known as: LASIX   Take 1 tablet (40 mg total) by mouth daily.      levothyroxine 100 MCG tablet   Commonly known as: SYNTHROID, LEVOTHROID   Take 100 mcg by mouth daily.      metoprolol succinate 25 MG 24 hr tablet   Commonly known as: TOPROL-XL   Take 0.5 tablets (12.5 mg total) by mouth daily. Take with or  immediately following a meal.      nitroGLYCERIN 0.4 MG SL tablet   Commonly known as: NITROSTAT   Place 0.4 mg under the tongue every 5 (five) minutes as needed. Chest pain      Osteo Bi-Flex Adv Double St Tabs   Take 1 tablet by mouth at bedtime.      OVER THE COUNTER MEDICATION   Place 1 spray into the nose 4 (four) times daily as needed. Nasal spray  nasal congestion      potassium chloride 10 MEQ tablet   Commonly known as: K-DUR   Take 1 tablet (10 mEq total) by mouth 2 (two) times daily.      triamcinolone 55 MCG/ACT nasal inhaler   Commonly known as: NASACORT   Place 1 spray into the nose daily.          BRIEF HPI:  See H&P, Labs, Consult and Test reports for all details in brief, Reports that over the last 8 days he has been having rhinorrhea and body aches. The problem has been persistent and he has mild relief with tylenol.  Nothing else makes him feel worse that he is aware of. The problem occurred insidiously 8 days ago and has persisted. Was associated with generalized body aches. He denies any recent sick contacts. He lives at home by himself but reports that his son lives "2 doors down" from him. He reports that he eats a low sodium diet and that within the last week his oral intake has been poor.  In the ED patient's wbc was WNL at 10.1, Chest x ray 2 view did not show any acute thoracic findings, U/A was WNL's, and Sodium level was 116.  We were subsequently contacted for evaluation and recommendations regarding admission for hyponatremia   CONSULTATIONS:  None  PERTINENT RADIOLOGIC STUDIES: Dg Chest 2 View  12/29/2011  *RADIOLOGY REPORT*  Clinical Data: Cough and congestion.  Fever.  Hypertension.  CHEST - 2 VIEW  Comparison: 05/15/2011  Findings: AICD noted with unchanged lead positioning.  Atherosclerotic aortic arch noted.  Old granulomatous disease noted on the right.  Heart size is within normal limits.  Thoracic spondylosis noted with several chronic  thoracic compression fractures.  Probable remote sternal fracture.  IMPRESSION:  1.  No acute thoracic findings. 2.  Old granulomatous disease. 3.  Old thoracic compression fractures and old sternal fracture. 4.  Atherosclerotic aortic arch.   Original Report Authenticated By: Dellia Cloud, M.D.      PERTINENT LAB RESULTS: CBC: No results found for this basename: WBC:2,HGB:2,HCT:2,PLT:2 in the last 72 hours CMET CMP     Component Value Date/Time   NA 128* 01/02/2012 0540   K 4.2 01/02/2012 0540   CL 95* 01/02/2012 0540   CO2 20 01/02/2012 0540   GLUCOSE 83 01/02/2012 0540   BUN 31* 01/02/2012 0540   CREATININE 1.49* 01/02/2012 0540   CALCIUM 8.9 01/02/2012 0540   PROT 6.9 12/29/2011 1019   ALBUMIN 3.7 12/29/2011 1019   AST 23 12/29/2011 1019   ALT 16 12/29/2011 1019   ALKPHOS 98 12/29/2011 1019   BILITOT 1.1 12/29/2011 1019   GFRNONAA 40* 01/02/2012 0540   GFRAA 47* 01/02/2012 0540    GFR Estimated Creatinine Clearance: 34.6 ml/min (by C-G formula based on Cr of 1.49). No results found for this basename: LIPASE:2,AMYLASE:2 in the last 72 hours No results found for this basename: CKTOTAL:3,CKMB:3,CKMBINDEX:3,TROPONINI:3 in the last 72 hours No components found with this basename: POCBNP:3 No results found for this basename: DDIMER:2 in the last 72 hours No results found for this basename: HGBA1C:2 in the last 72 hours No results found for this basename: CHOL:2,HDL:2,LDLCALC:2,TRIG:2,CHOLHDL:2,LDLDIRECT:2 in the last 72 hours  Basename 12/30/11 1553  TSH 4.012  T4TOTAL --  T3FREE --  THYROIDAB --   No results found for this basename: VITAMINB12:2,FOLATE:2,FERRITIN:2,TIBC:2,IRON:2,RETICCTPCT:2 in the last 72 hours Coags: No results found for this basename: PT:2,INR:2 in the last 72 hours Microbiology: No results found for this or any previous visit (from the past 240 hour(s)).   BRIEF HOSPITAL COURSE:   Principal Problem:  *Hyponatremia -Upon admission initially  given his history it was thought that he probably had hyponatremia from perhaps dehydration. However is hyponatremia still persisted in spite of IV fluids, subsequently a urine osmolality and serum osmolality were done. Serum osmolality was low at 246, urine osmolality was 419, a random urine sodium was 58. Patient was then placed on Lasix, was also placed on fluid restriction, with these measures sodium slowly started to come up. By discharge sodium had come up to 128. Patient feels much more steady on  his feet, he has been seen by physical therapy services, and deemed to need home health physical therapy. -A chest x-ray done on admission was negative for pneumonia or any other significant acute abnormalities. He does have some old granulomatous changes on his chest x-ray. At this time it is felt that his SIADH perhaps may be triggered by a smaller pneumonia, for now we will continue him on fluid restriction, Lasix and monitor him in the outpatient setting. However if he were to have recurrence of severe hyponatremia and at some point we will need to consider malignancy as a cause of his SIADH. He has been instructed to followup with his primary care practitioner within one week to get a repeat electrolytes.  Active Problems:  HYPOTHYROIDISM -TSH was within normal limits, continue with levothyroxine   HYPERTENSION -Since he will be on Lasix, his Toprol dose has been decreased to 12.5 mg.   CORONARY ARTERY DISEASE -This is stable, continue aspirin.   Chronic systolic heart failure -This is clinically compensated.   URI (upper respiratory infection)  -? Viral syndrome, this is resolved.   TODAY-DAY OF DISCHARGE:  Subjective:   [name] [name] today has no headache,no chest abdominal pain,no new weakness tingling or numbness, feels much better wants to go home today.   Objective:   Blood pressure 121/66, pulse 59, temperature 97.8 F (36.6 C), temperature source Oral, resp. rate 18, height  5' 9.6" (1.768 m), weight 70 kg (154 lb 5.2 oz), SpO2 98.00%.  Intake/Output Summary (Last 24 hours) at 01/02/12 1047 Last data filed at 01/02/12 0843  Gross per 24 hour  Intake    582 ml  Output   1566 ml  Net   -984 ml    Exam Awake Alert, Oriented *3, No new F.N deficits, Normal affect [place].AT,PERRAL Supple Neck,No JVD, No cervical lymphadenopathy appriciated.  Symmetrical Chest wall movement, Good air movement bilaterally, CTAB RRR,No Gallops,Rubs or new Murmurs, No Parasternal Heave +ve B.Sounds, Abd Soft, Non tender, No organomegaly appriciated, No rebound -guarding or rigidity. No Cyanosis, Clubbing or edema, No new Rash or bruise  DISCHARGE CONDITION: Stable  DISPOSITION: HOME  DISCHARGE INSTRUCTIONS:    Activity:  As tolerated with Full fall precautions use walker/cane & assistance as needed  Diet recommendation: Heart Healthy diet 1000 ml fluid restriction-daily  Follow-up Information    Follow up with Sonda Primes, MD. Schedule an appointment as soon as possible for a visit in 5 days.   Contact information:   [address] [place] [address] [phone]         Total Time spent on discharge equals 45 minutes.  SignedJeoffrey Massed 01/02/2012 10:47 AM

## 2012-01-02 NOTE — Telephone Encounter (Signed)
The hospital called hoping to schedule a hospital follow up for the patient.  The dr is hoping to get the apt on Monday or Tuesday.  I explained that Dr.Plotnikov was not in the office next week and the next available would be October 21st.  He stated that was too far out, and is hoping a different dr can see the patient Monday or Tuesday.  Do you want me to schedule a hospital follow up with a different Dr?  If so, is there a particular physician that you would prefer?   Dr. Jerral Ralph called from the hospital - [phone]   Thanks!

## 2012-01-03 ENCOUNTER — Ambulatory Visit (INDEPENDENT_AMBULATORY_CARE_PROVIDER_SITE_OTHER): Payer: Medicare Other | Admitting: Internal Medicine

## 2012-01-03 ENCOUNTER — Encounter: Payer: Self-pay | Admitting: Internal Medicine

## 2012-01-03 VITALS — BP 124/72 | HR 72 | Temp 97.7°F | Resp 16 | Wt 152.0 lb

## 2012-01-03 DIAGNOSIS — E871 Hypo-osmolality and hyponatremia: Secondary | ICD-10-CM

## 2012-01-03 DIAGNOSIS — I10 Essential (primary) hypertension: Secondary | ICD-10-CM

## 2012-01-03 NOTE — Assessment & Plan Note (Signed)
Continue with current prescription therapy as reflected on the Med list.  

## 2012-01-03 NOTE — Progress Notes (Signed)
Patient ID: [name] [name], male   DOB: 1922-06-07, 78 y.o.   MRN: [id number]   Subjective:    Patient ID: [name] [name], male    DOB: [birth year], 78 y.o.   MRN: [id number] He is here o hve a post-hosp f/up:   BRIEF HOSPITAL COURSE:  Principal Problem:  *Hyponatremia  -Upon admission initially given his history it was thought that he probably had hyponatremia from perhaps dehydration. However is hyponatremia still persisted in spite of IV fluids, subsequently a urine osmolality and serum osmolality were done. Serum osmolality was low at 246, urine osmolality was 419, a random urine sodium was 58. Patient was then placed on Lasix, was also placed on fluid restriction, with these measures sodium slowly started to come up. By discharge sodium had come up to 128. Patient feels much more steady on his feet, he has been seen by physical therapy services, and deemed to need home health physical therapy.  -A chest x-ray done on admission was negative for pneumonia or any other significant acute abnormalities. He does have some old granulomatous changes on his chest x-ray. At this time it is felt that his SIADH perhaps may be triggered by a smaller pneumonia, for now we will continue him on fluid restriction, Lasix and monitor him in the outpatient setting. However if he were to have recurrence of severe hyponatremia and at some point we will need to consider malignancy as a cause of his SIADH. He has been instructed to followup with his primary care practitioner within one week to get a repeat electrolytes.  Active Problems:  HYPOTHYROIDISM  -TSH was within normal limits, continue with levothyroxine  HYPERTENSION  -Since he will be on Lasix, his Toprol dose has been decreased to 12.5 mg.  CORONARY ARTERY DISEASE  -This is stable, continue aspirin.  Chronic systolic heart failure  -This is clinically compensated.  URI (upper respiratory infection)  -? Viral syndrome, this is resolved.      Back  Pain Pertinent negatives include no abdominal pain, chest pain or weakness.     The patient presents for a follow-up of  Chronic anal stenosis, hypertension, chronic dyslipidemia, CAD controlled with medicines  Wt Readings from Last 3 Encounters:  01/03/12 152 lb (68.947 kg)  12/29/11 154 lb 5.2 oz (70 kg)  11/22/11 157 lb 12 oz (71.555 kg)   BP Readings from Last 3 Encounters:  01/03/12 124/72  01/02/12 121/66  11/22/11 110/58      Review of Systems  Constitutional: Negative for appetite change, fatigue and unexpected weight change.  HENT: Negative for nosebleeds, congestion, sore throat, sneezing, trouble swallowing and neck pain.   Eyes: Negative for itching and visual disturbance.  Respiratory: Negative for cough.   Cardiovascular: Negative for chest pain, palpitations and leg swelling.  Gastrointestinal: Negative for nausea, vomiting, abdominal pain, diarrhea, constipation, blood in stool, abdominal distention and rectal pain.  Genitourinary: Negative for frequency and hematuria.  Musculoskeletal: Positive for back pain. Negative for joint swelling and gait problem.  Skin: Negative for rash.  Neurological: Negative for dizziness, tremors, speech difficulty and weakness.  Psychiatric/Behavioral: Negative for disturbed wake/sleep cycle, dysphoric mood and agitation. The patient is not nervous/anxious.        Objective:   Physical Exam  Constitutional: He is oriented to person, place, and time. He appears well-developed and well-nourished.  HENT:  Mouth/Throat: Oropharynx is clear and moist.  Eyes: Conjunctivae normal are normal. Pupils are equal, round, and reactive to light.  Neck: Normal  range of motion. No JVD present. No thyromegaly present.  Cardiovascular: Normal rate, regular rhythm, normal heart sounds and intact distal pulses.  Exam reveals no gallop and no friction rub.   No murmur heard. Pulmonary/Chest: Effort normal and breath sounds normal. No respiratory  distress. He has no wheezes. He has no rales. He exhibits no tenderness.  Abdominal: Soft. Bowel sounds are normal. He exhibits no distension and no mass. There is no tenderness. There is no rebound and no guarding.  Musculoskeletal: Normal range of motion. He exhibits tenderness (T12 is tender). He exhibits no edema.  Lymphadenopathy:    He has no cervical adenopathy.  Neurological: He is alert and oriented to person, place, and time. He has normal reflexes. No cranial nerve deficit. He exhibits normal muscle tone. Coordination normal.  Skin: Skin is warm and dry. No rash noted.  Psychiatric: He has a normal mood and affect. His behavior is normal. Judgment and thought content normal.   Lab Results  Component Value Date   WBC 10.7* 12/30/2011   HGB 12.5* 12/30/2011   HCT 35.0* 12/30/2011   PLT 162 12/30/2011   GLUCOSE 83 01/02/2012   CHOL 104 11/28/2011   TRIG 44.0 11/28/2011   HDL 37.30* 11/28/2011   LDLCALC 58 11/28/2011   ALT 16 12/29/2011   AST 23 12/29/2011   NA 128* 01/02/2012   K 4.2 01/02/2012   CL 95* 01/02/2012   CREATININE 1.49* 01/02/2012   BUN 31* 01/02/2012   CO2 20 01/02/2012   TSH 4.012 12/30/2011   PSA 3.42 11/28/2011   INR 1.1 ratio* 08/17/2009          Assessment & Plan:

## 2012-01-03 NOTE — Assessment & Plan Note (Signed)
Reduce liquids Labs in 1 week

## 2012-01-06 DIAGNOSIS — I5022 Chronic systolic (congestive) heart failure: Secondary | ICD-10-CM | POA: Diagnosis not present

## 2012-01-06 DIAGNOSIS — R262 Difficulty in walking, not elsewhere classified: Secondary | ICD-10-CM | POA: Diagnosis not present

## 2012-01-06 DIAGNOSIS — IMO0001 Reserved for inherently not codable concepts without codable children: Secondary | ICD-10-CM | POA: Diagnosis not present

## 2012-01-06 DIAGNOSIS — J069 Acute upper respiratory infection, unspecified: Secondary | ICD-10-CM | POA: Diagnosis not present

## 2012-01-06 DIAGNOSIS — I2510 Atherosclerotic heart disease of native coronary artery without angina pectoris: Secondary | ICD-10-CM | POA: Diagnosis not present

## 2012-01-06 DIAGNOSIS — I10 Essential (primary) hypertension: Secondary | ICD-10-CM | POA: Diagnosis not present

## 2012-01-08 ENCOUNTER — Other Ambulatory Visit: Payer: Self-pay | Admitting: Internal Medicine

## 2012-01-09 ENCOUNTER — Other Ambulatory Visit (INDEPENDENT_AMBULATORY_CARE_PROVIDER_SITE_OTHER): Payer: Medicare Other

## 2012-01-09 ENCOUNTER — Telehealth: Payer: Self-pay | Admitting: Internal Medicine

## 2012-01-09 DIAGNOSIS — J069 Acute upper respiratory infection, unspecified: Secondary | ICD-10-CM | POA: Diagnosis not present

## 2012-01-09 DIAGNOSIS — IMO0001 Reserved for inherently not codable concepts without codable children: Secondary | ICD-10-CM | POA: Diagnosis not present

## 2012-01-09 DIAGNOSIS — E871 Hypo-osmolality and hyponatremia: Secondary | ICD-10-CM | POA: Diagnosis not present

## 2012-01-09 DIAGNOSIS — I10 Essential (primary) hypertension: Secondary | ICD-10-CM

## 2012-01-09 DIAGNOSIS — I2510 Atherosclerotic heart disease of native coronary artery without angina pectoris: Secondary | ICD-10-CM | POA: Diagnosis not present

## 2012-01-09 DIAGNOSIS — R262 Difficulty in walking, not elsewhere classified: Secondary | ICD-10-CM | POA: Diagnosis not present

## 2012-01-09 DIAGNOSIS — I5022 Chronic systolic (congestive) heart failure: Secondary | ICD-10-CM | POA: Diagnosis not present

## 2012-01-09 LAB — BASIC METABOLIC PANEL
CO2: 28 mEq/L (ref 19–32)
Calcium: 9.1 mg/dL (ref 8.4–10.5)
Creatinine, Ser: 2.2 mg/dL — ABNORMAL HIGH (ref 0.4–1.5)
Glucose, Bld: 83 mg/dL (ref 70–99)

## 2012-01-09 NOTE — Telephone Encounter (Signed)
[name] [name], please, inform patient that all labs are normal except for abn kidney test (sodium is nl now) Hold Furosemide

## 2012-01-13 DIAGNOSIS — J069 Acute upper respiratory infection, unspecified: Secondary | ICD-10-CM | POA: Diagnosis not present

## 2012-01-13 DIAGNOSIS — R262 Difficulty in walking, not elsewhere classified: Secondary | ICD-10-CM | POA: Diagnosis not present

## 2012-01-13 DIAGNOSIS — I5022 Chronic systolic (congestive) heart failure: Secondary | ICD-10-CM | POA: Diagnosis not present

## 2012-01-13 DIAGNOSIS — I2510 Atherosclerotic heart disease of native coronary artery without angina pectoris: Secondary | ICD-10-CM | POA: Diagnosis not present

## 2012-01-13 DIAGNOSIS — IMO0001 Reserved for inherently not codable concepts without codable children: Secondary | ICD-10-CM | POA: Diagnosis not present

## 2012-01-13 DIAGNOSIS — I10 Essential (primary) hypertension: Secondary | ICD-10-CM | POA: Diagnosis not present

## 2012-01-16 DIAGNOSIS — I5022 Chronic systolic (congestive) heart failure: Secondary | ICD-10-CM | POA: Diagnosis not present

## 2012-01-16 DIAGNOSIS — IMO0001 Reserved for inherently not codable concepts without codable children: Secondary | ICD-10-CM | POA: Diagnosis not present

## 2012-01-16 DIAGNOSIS — I10 Essential (primary) hypertension: Secondary | ICD-10-CM | POA: Diagnosis not present

## 2012-01-16 DIAGNOSIS — R262 Difficulty in walking, not elsewhere classified: Secondary | ICD-10-CM | POA: Diagnosis not present

## 2012-01-16 DIAGNOSIS — J069 Acute upper respiratory infection, unspecified: Secondary | ICD-10-CM | POA: Diagnosis not present

## 2012-01-16 DIAGNOSIS — I2510 Atherosclerotic heart disease of native coronary artery without angina pectoris: Secondary | ICD-10-CM | POA: Diagnosis not present

## 2012-01-21 DIAGNOSIS — I2510 Atherosclerotic heart disease of native coronary artery without angina pectoris: Secondary | ICD-10-CM | POA: Diagnosis not present

## 2012-01-21 DIAGNOSIS — I10 Essential (primary) hypertension: Secondary | ICD-10-CM | POA: Diagnosis not present

## 2012-01-21 DIAGNOSIS — J069 Acute upper respiratory infection, unspecified: Secondary | ICD-10-CM | POA: Diagnosis not present

## 2012-01-21 DIAGNOSIS — IMO0001 Reserved for inherently not codable concepts without codable children: Secondary | ICD-10-CM | POA: Diagnosis not present

## 2012-01-21 DIAGNOSIS — R262 Difficulty in walking, not elsewhere classified: Secondary | ICD-10-CM | POA: Diagnosis not present

## 2012-01-21 DIAGNOSIS — I5022 Chronic systolic (congestive) heart failure: Secondary | ICD-10-CM | POA: Diagnosis not present

## 2012-01-22 DIAGNOSIS — H26499 Other secondary cataract, unspecified eye: Secondary | ICD-10-CM | POA: Diagnosis not present

## 2012-01-22 DIAGNOSIS — H353290 Exudative age-related macular degeneration, unspecified eye, stage unspecified: Secondary | ICD-10-CM | POA: Diagnosis not present

## 2012-01-23 DIAGNOSIS — R262 Difficulty in walking, not elsewhere classified: Secondary | ICD-10-CM | POA: Diagnosis not present

## 2012-01-23 DIAGNOSIS — J069 Acute upper respiratory infection, unspecified: Secondary | ICD-10-CM | POA: Diagnosis not present

## 2012-01-23 DIAGNOSIS — I10 Essential (primary) hypertension: Secondary | ICD-10-CM | POA: Diagnosis not present

## 2012-01-23 DIAGNOSIS — IMO0001 Reserved for inherently not codable concepts without codable children: Secondary | ICD-10-CM | POA: Diagnosis not present

## 2012-01-23 DIAGNOSIS — I2510 Atherosclerotic heart disease of native coronary artery without angina pectoris: Secondary | ICD-10-CM | POA: Diagnosis not present

## 2012-01-23 DIAGNOSIS — I5022 Chronic systolic (congestive) heart failure: Secondary | ICD-10-CM | POA: Diagnosis not present

## 2012-01-28 DIAGNOSIS — I10 Essential (primary) hypertension: Secondary | ICD-10-CM

## 2012-01-28 DIAGNOSIS — IMO0001 Reserved for inherently not codable concepts without codable children: Secondary | ICD-10-CM | POA: Diagnosis not present

## 2012-01-28 DIAGNOSIS — J069 Acute upper respiratory infection, unspecified: Secondary | ICD-10-CM

## 2012-01-28 DIAGNOSIS — I2510 Atherosclerotic heart disease of native coronary artery without angina pectoris: Secondary | ICD-10-CM

## 2012-01-29 ENCOUNTER — Encounter: Payer: Self-pay | Admitting: Internal Medicine

## 2012-01-29 ENCOUNTER — Ambulatory Visit (INDEPENDENT_AMBULATORY_CARE_PROVIDER_SITE_OTHER): Payer: Medicare Other | Admitting: Internal Medicine

## 2012-01-29 VITALS — BP 126/70 | HR 71 | Ht 68.0 in | Wt 151.0 lb

## 2012-01-29 DIAGNOSIS — I5022 Chronic systolic (congestive) heart failure: Secondary | ICD-10-CM | POA: Diagnosis not present

## 2012-01-29 DIAGNOSIS — I495 Sick sinus syndrome: Secondary | ICD-10-CM | POA: Diagnosis not present

## 2012-01-29 LAB — ICD DEVICE OBSERVATION
AL AMPLITUDE: 3.3 mv
AL THRESHOLD: 0.75 V
BAMS-0001: 170 {beats}/min
CHARGE TIME: 9.2 s
RV LEAD IMPEDENCE ICD: 380 Ohm
RV LEAD THRESHOLD: 1.25 V
TZAT-0002ATACH: NEGATIVE
TZAT-0002ATACH: NEGATIVE
TZAT-0004SLOWVT: 8
TZAT-0004SLOWVT: 8
TZAT-0005SLOWVT: 88 pct
TZAT-0005SLOWVT: 91 pct
TZAT-0011SLOWVT: 10 ms
TZAT-0011SLOWVT: 10 ms
TZAT-0012ATACH: 150 ms
TZAT-0012ATACH: 150 ms
TZAT-0012FASTVT: 200 ms
TZAT-0012SLOWVT: 200 ms
TZAT-0012SLOWVT: 200 ms
TZAT-0018ATACH: NEGATIVE
TZAT-0019ATACH: 6 V
TZAT-0019ATACH: 6 V
TZAT-0019FASTVT: 8 V
TZAT-0020ATACH: 1.5 ms
TZAT-0020ATACH: 1.5 ms
TZAT-0020FASTVT: 1.5 ms
TZON-0003ATACH: 350 ms
TZON-0003SLOWVT: 380 ms
TZON-0003VSLOWVT: 410 ms
TZST-0001ATACH: 4
TZST-0001FASTVT: 2
TZST-0001FASTVT: 6
TZST-0001SLOWVT: 4
TZST-0002FASTVT: NEGATIVE
TZST-0002FASTVT: NEGATIVE
TZST-0002FASTVT: NEGATIVE
TZST-0003SLOWVT: 25 J
TZST-0003SLOWVT: 35 J
TZST-0003SLOWVT: 35 J

## 2012-01-29 NOTE — Progress Notes (Signed)
HPI [name] returns today for followup. He is a pleasant elderly man with an ICM, chronic systolic CHF, s/p ICD implant. In the interim he has been stable. He has class 2. Symptoms. No syncope. No edema. No Known Allergies   Current Outpatient Prescriptions  Medication Sig Dispense Refill  . aspirin 81 MG EC tablet Take 81 mg by mouth daily.        Marland Kitchen atorvastatin (LIPITOR) 20 MG tablet Take 10 mg by mouth 2 (two) times daily.      . Cholecalciferol (VITAMIN D3) 1000 UNITS tablet Take 1,000 Units by mouth daily.        . Diltiazem HCl POWD APPLY A SMALL PEA-SIZED AMOUNT TO ANAL AREA 2-3 TIMES DAILY.  30 g  2  . donepezil (ARICEPT) 10 MG tablet Take 10 mg by mouth at bedtime.      . folic acid (FOLVITE) 800 MCG tablet Take 800 mcg by mouth daily.       . furosemide (LASIX) 40 MG tablet Take 1 tablet (40 mg total) by mouth daily.  30 tablet  0  . levothyroxine (SYNTHROID, LEVOTHROID) 100 MCG tablet Take 100 mcg by mouth daily.      . metoprolol succinate (TOPROL-XL) 25 MG 24 hr tablet Take 0.5 tablets (12.5 mg total) by mouth daily. Take with or immediately following a meal.  30 tablet  0  . Misc Natural Products (OSTEO BI-FLEX ADV DOUBLE ST) TABS Take 1 tablet by mouth at bedtime.       . nitroGLYCERIN (NITROSTAT) 0.4 MG SL tablet Place 0.4 mg under the tongue every 5 (five) minutes as needed. Chest pain      . OVER THE COUNTER MEDICATION Place 1 spray into the nose 4 (four) times daily as needed. Nasal spray nasal congestion      . potassium chloride (K-DUR) 10 MEQ tablet Take 1 tablet (10 mEq total) by mouth 2 (two) times daily.  30 tablet  0  . triamcinolone (NASACORT) 55 MCG/ACT nasal inhaler Place 1 spray into the nose daily.         Past Medical History  Diagnosis Date  . Hypertension   . Hypothyroid   . CAD (coronary artery disease)     Dr. Ladona Ridgel  . Osteoarthritis   . BPH (benign prostatic hypertrophy)     Dr. Vonita Moss  . Osteoporosis     (took actonel x 5y)  . Renal  insufficiency     Dr. Merrily Brittle  . Hip fx     x 2 on R.  . Sternum fx 2011  . Urinary incontinence   . Hearing loss     ROS:   All systems reviewed and negative except as noted in the HPI.   Past Surgical History  Procedure Date  . Heart disease     permanent pacemaker  . Appendectomy   . Inguinal hernia repair   . Hip fracture surgery 2011    ORIF  R.  . Pacemaker insertion   . Colonoscopy 12/1998, 02/2004    diverticulosois, external hemorrhoids     Family History  Problem Relation Age of Onset  . Coronary artery disease      male 1st degree relative<60  . Hypertension    . Cancer Sister      History   Social History  . Marital Status: Married    Spouse Name: N/A    Number of Children: 2  . Years of Education: N/A   Occupational History  .  retired    Social History Main Topics  . Smoking status: Former Smoker -- 1.0 packs/day for 30 years    Quit date: 12/29/1950  . Smokeless tobacco: Never Used  . Alcohol Use: No  . Drug Use: No  . Sexually Active: Not Currently   Other Topics Concern  . Not on file   Social History Narrative   Regular exercise--no.     BP 126/70  Pulse 71  Ht 5\' 8"  (1.727 m)  Wt 151 lb (68.493 kg)  BMI 22.96 kg/m2  SpO2 95%  Physical Exam:  Well appearing elderly man, NAD HEENT: Unremarkable Neck:  No JVD, no thyromegally Lungs:  Clear with no wheezes, rales, or rhonchi HEART:  Regular rate rhythm, no murmurs, no rubs, no clicks Abd:  soft, positive bowel sounds, no organomegally, no rebound, no guarding Ext:  2 plus pulses, no edema, no cyanosis, no clubbing Skin:  No rashes no nodules Neuro:  CN II through XII intact, motor grossly intact   DEVICE  Normal device function.  See PaceArt for details.   Assess/Plan:

## 2012-01-29 NOTE — Patient Instructions (Signed)
Your physician wants you to follow-up in: 12 months with Dr. Taylor. You will receive a reminder letter in the mail two months in advance. If you don't receive a letter, please call our office to schedule the follow-up appointment.    

## 2012-02-01 DIAGNOSIS — I10 Essential (primary) hypertension: Secondary | ICD-10-CM

## 2012-02-01 DIAGNOSIS — E871 Hypo-osmolality and hyponatremia: Secondary | ICD-10-CM

## 2012-02-14 ENCOUNTER — Encounter: Payer: Self-pay | Admitting: Internal Medicine

## 2012-02-14 ENCOUNTER — Other Ambulatory Visit (INDEPENDENT_AMBULATORY_CARE_PROVIDER_SITE_OTHER): Payer: Medicare Other

## 2012-02-14 ENCOUNTER — Ambulatory Visit (INDEPENDENT_AMBULATORY_CARE_PROVIDER_SITE_OTHER): Payer: Medicare Other | Admitting: Internal Medicine

## 2012-02-14 VITALS — BP 132/70 | HR 80 | Resp 16 | Wt 153.0 lb

## 2012-02-14 DIAGNOSIS — E039 Hypothyroidism, unspecified: Secondary | ICD-10-CM

## 2012-02-14 DIAGNOSIS — N259 Disorder resulting from impaired renal tubular function, unspecified: Secondary | ICD-10-CM

## 2012-02-14 DIAGNOSIS — R413 Other amnesia: Secondary | ICD-10-CM

## 2012-02-14 DIAGNOSIS — Z23 Encounter for immunization: Secondary | ICD-10-CM

## 2012-02-14 DIAGNOSIS — E871 Hypo-osmolality and hyponatremia: Secondary | ICD-10-CM

## 2012-02-14 DIAGNOSIS — R279 Unspecified lack of coordination: Secondary | ICD-10-CM

## 2012-02-14 DIAGNOSIS — I10 Essential (primary) hypertension: Secondary | ICD-10-CM

## 2012-02-14 LAB — CBC WITH DIFFERENTIAL/PLATELET
Basophils Absolute: 0 10*3/uL (ref 0.0–0.1)
HCT: 40.5 % (ref 39.0–52.0)
Hemoglobin: 13.4 g/dL (ref 13.0–17.0)
Lymphs Abs: 2 10*3/uL (ref 0.7–4.0)
MCHC: 33 g/dL (ref 30.0–36.0)
MCV: 93.7 fl (ref 78.0–100.0)
Monocytes Absolute: 1.5 10*3/uL — ABNORMAL HIGH (ref 0.1–1.0)
Monocytes Relative: 14.9 % — ABNORMAL HIGH (ref 3.0–12.0)
Neutro Abs: 5.7 10*3/uL (ref 1.4–7.7)
Platelets: 172 10*3/uL (ref 150.0–400.0)
RDW: 13.2 % (ref 11.5–14.6)

## 2012-02-14 LAB — BASIC METABOLIC PANEL
BUN: 34 mg/dL — ABNORMAL HIGH (ref 6–23)
CO2: 27 mEq/L (ref 19–32)
Chloride: 102 mEq/L (ref 96–112)
GFR: 40.96 mL/min — ABNORMAL LOW (ref >60.00)
Glucose, Bld: 107 mg/dL — ABNORMAL HIGH (ref 70–99)
Potassium: 4.5 mEq/L (ref 3.5–5.1)
Sodium: 135 mEq/L (ref 135–145)

## 2012-02-14 MED ORDER — NITROGLYCERIN 0.4 MG SL SUBL: 0.4000 mg | SUBLINGUAL_TABLET | SUBLINGUAL | Status: DC | PRN

## 2012-02-14 MED ORDER — METOPROLOL SUCCINATE ER 25 MG PO TB24: 12.5000 mg | ORAL_TABLET | Freq: Two times a day (BID) | ORAL | Status: DC

## 2012-02-14 NOTE — Assessment & Plan Note (Signed)
Continue with current prescription therapy as reflected on the Med list.  

## 2012-02-14 NOTE — Assessment & Plan Note (Signed)
BMET 

## 2012-02-14 NOTE — Assessment & Plan Note (Signed)
Better  

## 2012-02-14 NOTE — Progress Notes (Signed)
   Subjective:    Patient ID: [name] [name], male    DOB: May 11, 1922, 78 y.o.   MRN: [id number]     HPI   The patient presents for a follow-up of anal stenosis, hypertension, chronic dyslipidemia, CAD, dementia controlled with medicines F/u on low Na He insisting on tDAP  Wt Readings from Last 3 Encounters:  02/14/12 153 lb (69.4 kg)  01/29/12 151 lb (68.493 kg)  01/03/12 152 lb (68.947 kg)   BP Readings from Last 3 Encounters:  02/14/12 132/70  01/29/12 126/70  01/03/12 124/72      Review of Systems  Constitutional: Negative for appetite change, fatigue and unexpected weight change.  HENT: Negative for nosebleeds, congestion, sore throat, sneezing, trouble swallowing and neck pain.   Eyes: Negative for itching and visual disturbance.  Respiratory: Negative for cough.   Cardiovascular: Negative for palpitations and leg swelling.  Gastrointestinal: Negative for nausea, vomiting, diarrhea, constipation, blood in stool, abdominal distention and rectal pain.  Genitourinary: Negative for frequency and hematuria.  Musculoskeletal: Negative for joint swelling and gait problem.  Skin: Negative for rash.  Neurological: Negative for dizziness, tremors and speech difficulty.  Psychiatric/Behavioral: Negative for sleep disturbance, dysphoric mood and agitation. The patient is not nervous/anxious.        Objective:   Physical Exam  Constitutional: He is oriented to person, place, and time. He appears well-developed and well-nourished.  HENT:  Mouth/Throat: Oropharynx is clear and moist.  Eyes: Conjunctivae normal are normal. Pupils are equal, round, and reactive to light.  Neck: Normal range of motion. No JVD present. No thyromegaly present.  Cardiovascular: Normal rate, regular rhythm, normal heart sounds and intact distal pulses.  Exam reveals no gallop and no friction rub.   No murmur heard. Pulmonary/Chest: Effort normal and breath sounds normal. No respiratory distress. He  has no wheezes. He has no rales. He exhibits no tenderness.  Abdominal: Soft. Bowel sounds are normal. He exhibits no distension and no mass. There is no tenderness. There is no rebound and no guarding.  Musculoskeletal: Normal range of motion. He exhibits tenderness (T12 is tender). He exhibits no edema.  Lymphadenopathy:    He has no cervical adenopathy.  Neurological: He is alert and oriented to person, place, and time. He has normal reflexes. No cranial nerve deficit. He exhibits normal muscle tone. Coordination normal.  Skin: Skin is warm and dry. No rash noted.  Psychiatric: He has a normal mood and affect. His behavior is normal. Judgment and thought content normal.   Lab Results  Component Value Date   WBC 10.7* 12/30/2011   HGB 12.5* 12/30/2011   HCT 35.0* 12/30/2011   PLT 162 12/30/2011   GLUCOSE 83 01/09/2012   CHOL 104 11/28/2011   TRIG 44.0 11/28/2011   HDL 37.30* 11/28/2011   LDLCALC 58 11/28/2011   ALT 16 12/29/2011   AST 23 12/29/2011   NA 137 01/09/2012   K 4.8 01/09/2012   CL 101 01/09/2012   CREATININE 2.2* 01/09/2012   BUN 44* 01/09/2012   CO2 28 01/09/2012   TSH 4.012 12/30/2011   PSA 3.42 11/28/2011   INR 1.1 ratio* 08/17/2009          Assessment & Plan:

## 2012-02-15 ENCOUNTER — Encounter: Payer: Self-pay | Admitting: Internal Medicine

## 2012-02-17 ENCOUNTER — Other Ambulatory Visit: Payer: Self-pay | Admitting: *Deleted

## 2012-02-17 MED ORDER — NITROGLYCERIN 0.4 MG SL SUBL: 0.4000 mg | SUBLINGUAL_TABLET | SUBLINGUAL | Status: DC | PRN

## 2012-02-17 MED ORDER — ATORVASTATIN CALCIUM 20 MG PO TABS: 10.0000 mg | ORAL_TABLET | Freq: Two times a day (BID) | ORAL | Status: DC

## 2012-02-17 NOTE — Telephone Encounter (Signed)
REFILL ATORVASTATIN TO [facility]

## 2012-04-01 ENCOUNTER — Ambulatory Visit: Payer: Medicare Other | Admitting: Internal Medicine

## 2012-04-29 DIAGNOSIS — L821 Other seborrheic keratosis: Secondary | ICD-10-CM | POA: Diagnosis not present

## 2012-04-29 DIAGNOSIS — D235 Other benign neoplasm of skin of trunk: Secondary | ICD-10-CM | POA: Diagnosis not present

## 2012-04-29 DIAGNOSIS — L820 Inflamed seborrheic keratosis: Secondary | ICD-10-CM | POA: Diagnosis not present

## 2012-05-01 ENCOUNTER — Other Ambulatory Visit: Payer: Self-pay | Admitting: *Deleted

## 2012-05-01 MED ORDER — METOPROLOL SUCCINATE ER 25 MG PO TB24: 12.5000 mg | ORAL_TABLET | Freq: Two times a day (BID) | ORAL | Status: DC

## 2012-05-01 MED ORDER — TRIAMCINOLONE ACETONIDE(NASAL) 55 MCG/ACT NA INHA: 1.0000 | Freq: Every day | NASAL | Status: DC

## 2012-05-01 MED ORDER — LEVOTHYROXINE SODIUM 100 MCG PO TABS: 100.0000 ug | ORAL_TABLET | Freq: Every day | ORAL | Status: DC

## 2012-05-01 MED ORDER — DONEPEZIL HCL 10 MG PO TABS: 10.0000 mg | ORAL_TABLET | Freq: Every day | ORAL | Status: DC

## 2012-05-01 NOTE — Telephone Encounter (Signed)
Pt needs refills sent to [facility]-he changed insurance this year. Rx's sent.

## 2012-05-04 ENCOUNTER — Encounter: Payer: Medicare Other | Admitting: *Deleted

## 2012-05-06 ENCOUNTER — Other Ambulatory Visit: Payer: Self-pay | Admitting: *Deleted

## 2012-05-06 MED ORDER — ATORVASTATIN CALCIUM 20 MG PO TABS: 10.0000 mg | ORAL_TABLET | Freq: Two times a day (BID) | ORAL | Status: DC

## 2012-05-11 ENCOUNTER — Telehealth: Payer: Self-pay | Admitting: *Deleted

## 2012-05-11 ENCOUNTER — Encounter: Payer: Self-pay | Admitting: *Deleted

## 2012-05-11 MED ORDER — FLUTICASONE PROPIONATE 50 MCG/ACT NA SUSP: 2.0000 | Freq: Every day | NASAL | Status: DC

## 2012-05-11 NOTE — Telephone Encounter (Signed)
Rec fax from [facility] stating Triamcinolone is not on formulary. Alternative is Fluticasone 50 mcg/act nasal spray. Please advise. Ok to change?

## 2012-05-11 NOTE — Telephone Encounter (Signed)
New Rx sent.

## 2012-05-11 NOTE — Telephone Encounter (Signed)
Ok Thx 

## 2012-05-12 ENCOUNTER — Encounter: Payer: Self-pay | Admitting: Internal Medicine

## 2012-05-12 ENCOUNTER — Other Ambulatory Visit: Payer: Self-pay | Admitting: Internal Medicine

## 2012-05-12 ENCOUNTER — Ambulatory Visit (INDEPENDENT_AMBULATORY_CARE_PROVIDER_SITE_OTHER): Payer: Medicare Other | Admitting: *Deleted

## 2012-05-12 DIAGNOSIS — I5022 Chronic systolic (congestive) heart failure: Secondary | ICD-10-CM | POA: Diagnosis not present

## 2012-05-12 DIAGNOSIS — Z95810 Presence of automatic (implantable) cardiac defibrillator: Secondary | ICD-10-CM | POA: Diagnosis not present

## 2012-05-17 LAB — REMOTE ICD DEVICE
AL IMPEDENCE ICD: 532 Ohm
CHARGE TIME: 9.529 s
DEV-0020ICD: NEGATIVE
PACEART VT: 0
RV LEAD AMPLITUDE: 10.6 mv
TOT-0001: 1
TOT-0002: 0
TOT-0006: 20110602000000
TZAT-0001ATACH: 3
TZAT-0001FASTVT: 1
TZAT-0001SLOWVT: 1
TZAT-0002ATACH: NEGATIVE
TZAT-0012ATACH: 150 ms
TZAT-0012ATACH: 150 ms
TZAT-0012FASTVT: 200 ms
TZAT-0012SLOWVT: 200 ms
TZAT-0012SLOWVT: 200 ms
TZAT-0018ATACH: NEGATIVE
TZAT-0019ATACH: 6 V
TZAT-0019SLOWVT: 8 V
TZAT-0019SLOWVT: 8 V
TZAT-0020ATACH: 1.5 ms
TZAT-0020ATACH: 1.5 ms
TZAT-0020SLOWVT: 1.5 ms
TZAT-0020SLOWVT: 1.5 ms
TZON-0003VSLOWVT: 410 ms
TZST-0001ATACH: 5
TZST-0001ATACH: 6
TZST-0001FASTVT: 4
TZST-0001FASTVT: 5
TZST-0001SLOWVT: 5
TZST-0002ATACH: NEGATIVE
TZST-0002FASTVT: NEGATIVE
TZST-0002FASTVT: NEGATIVE
TZST-0003SLOWVT: 25 J
TZST-0003SLOWVT: 35 J
VENTRICULAR PACING ICD: 0 pct

## 2012-05-19 ENCOUNTER — Telehealth: Payer: Self-pay | Admitting: Internal Medicine

## 2012-05-19 ENCOUNTER — Encounter: Payer: Self-pay | Admitting: Internal Medicine

## 2012-05-19 ENCOUNTER — Other Ambulatory Visit (INDEPENDENT_AMBULATORY_CARE_PROVIDER_SITE_OTHER): Payer: Medicare Other

## 2012-05-19 ENCOUNTER — Ambulatory Visit (INDEPENDENT_AMBULATORY_CARE_PROVIDER_SITE_OTHER): Payer: Medicare Other | Admitting: Internal Medicine

## 2012-05-19 VITALS — BP 120/70 | HR 72 | Temp 96.8°F | Resp 16 | Wt 153.0 lb

## 2012-05-19 DIAGNOSIS — E871 Hypo-osmolality and hyponatremia: Secondary | ICD-10-CM | POA: Diagnosis not present

## 2012-05-19 DIAGNOSIS — I10 Essential (primary) hypertension: Secondary | ICD-10-CM | POA: Diagnosis not present

## 2012-05-19 DIAGNOSIS — I2510 Atherosclerotic heart disease of native coronary artery without angina pectoris: Secondary | ICD-10-CM | POA: Diagnosis not present

## 2012-05-19 LAB — BASIC METABOLIC PANEL
CO2: 25 mEq/L (ref 19–32)
Calcium: 9 mg/dL (ref 8.4–10.5)
Creatinine, Ser: 1.7 mg/dL — ABNORMAL HIGH (ref 0.4–1.5)
GFR: 41.21 mL/min — ABNORMAL LOW (ref >60.00)
Glucose, Bld: 84 mg/dL (ref 70–99)
Sodium: 135 mEq/L (ref 135–145)

## 2012-05-19 MED ORDER — LEVOTHYROXINE SODIUM 100 MCG PO TABS: 100.0000 ug | ORAL_TABLET | Freq: Every day | ORAL | Status: DC

## 2012-05-19 MED ORDER — METOPROLOL SUCCINATE ER 25 MG PO TB24: 12.5000 mg | ORAL_TABLET | Freq: Two times a day (BID) | ORAL | Status: DC

## 2012-05-19 MED ORDER — FLUTICASONE PROPIONATE 50 MCG/ACT NA SUSP: 2.0000 | Freq: Every day | NASAL | Status: DC

## 2012-05-19 NOTE — Assessment & Plan Note (Signed)
Continue with current prescription therapy as reflected on the Med list.  

## 2012-05-19 NOTE — Telephone Encounter (Signed)
Stacey, please, inform patient that all labs are ok Thx 

## 2012-05-19 NOTE — Assessment & Plan Note (Signed)
Discussed.

## 2012-05-19 NOTE — Assessment & Plan Note (Signed)
Watching Na

## 2012-05-19 NOTE — Assessment & Plan Note (Signed)
Watching creat

## 2012-05-19 NOTE — Progress Notes (Signed)
   Subjective:       HPI   The patient presents for a follow-up of  hypertension, chronic dyslipidemia, CAD, dementia controlled with medicines F/u on low Na   Wt Readings from Last 3 Encounters:  05/19/12 153 lb (69.4 kg)  02/14/12 153 lb (69.4 kg)  01/29/12 151 lb (68.493 kg)   BP Readings from Last 3 Encounters:  05/19/12 120/70  02/14/12 132/70  01/29/12 126/70      Review of Systems  Constitutional: Negative for appetite change, fatigue and unexpected weight change.  HENT: Negative for nosebleeds, congestion, sore throat, sneezing, trouble swallowing and neck pain.   Eyes: Negative for itching and visual disturbance.  Respiratory: Negative for cough.   Cardiovascular: Negative for palpitations and leg swelling.  Gastrointestinal: Negative for nausea, vomiting, diarrhea, constipation, blood in stool, abdominal distention and rectal pain.  Genitourinary: Negative for frequency and hematuria.  Musculoskeletal: Negative for joint swelling and gait problem.  Skin: Negative for rash.  Neurological: Negative for dizziness, tremors and speech difficulty.  Psychiatric/Behavioral: Negative for sleep disturbance, dysphoric mood and agitation. The patient is not nervous/anxious.        Objective:   Physical Exam  Constitutional: He is oriented to person, place, and time. He appears well-developed and well-nourished.  HENT:  Mouth/Throat: Oropharynx is clear and moist.  Eyes: Conjunctivae are normal. Pupils are equal, round, and reactive to light.  Neck: Normal range of motion. No JVD present. No thyromegaly present.  Cardiovascular: Normal rate, regular rhythm, normal heart sounds and intact distal pulses.  Exam reveals no gallop and no friction rub.   No murmur heard. Pulmonary/Chest: Effort normal and breath sounds normal. No respiratory distress. He has no wheezes. He has no rales. He exhibits no tenderness.  Abdominal: Soft. Bowel sounds are normal. He exhibits no  distension and no mass. There is no tenderness. There is no rebound and no guarding.  Musculoskeletal: Normal range of motion. He exhibits tenderness (T12 is tender). He exhibits no edema.  Lymphadenopathy:    He has no cervical adenopathy.  Neurological: He is alert and oriented to person, place, and time. He has normal reflexes. No cranial nerve deficit. He exhibits normal muscle tone. Coordination normal.  Skin: Skin is warm and dry. No rash noted.  Psychiatric: He has a normal mood and affect. His behavior is normal. Judgment and thought content normal.   Lab Results  Component Value Date   WBC 9.9 02/14/2012   HGB 13.4 02/14/2012   HCT 40.5 02/14/2012   PLT 172.0 02/14/2012   GLUCOSE 107* 02/14/2012   CHOL 104 11/28/2011   TRIG 44.0 11/28/2011   HDL 37.30* 11/28/2011   LDLCALC 58 11/28/2011   ALT 16 12/29/2011   AST 23 12/29/2011   NA 135 02/14/2012   K 4.5 02/14/2012   CL 102 02/14/2012   CREATININE 1.7* 02/14/2012   BUN 34* 02/14/2012   CO2 27 02/14/2012   TSH 4.012 12/30/2011   PSA 3.42 11/28/2011   INR 1.1 ratio* 08/17/2009          Assessment & Plan:

## 2012-05-20 NOTE — Telephone Encounter (Signed)
Pt informed

## 2012-05-27 ENCOUNTER — Encounter: Payer: Self-pay | Admitting: *Deleted

## 2012-06-26 ENCOUNTER — Telehealth: Payer: Self-pay | Admitting: Internal Medicine

## 2012-06-26 ENCOUNTER — Ambulatory Visit (INDEPENDENT_AMBULATORY_CARE_PROVIDER_SITE_OTHER)
Admission: RE | Admit: 2012-06-26 | Discharge: 2012-06-26 | Disposition: A | Discharge status: Home / Self Care | Payer: Medicare Other | Source: Ambulatory Visit | Attending: Internal Medicine | Admitting: Internal Medicine

## 2012-06-26 ENCOUNTER — Encounter: Payer: Self-pay | Admitting: Internal Medicine

## 2012-06-26 ENCOUNTER — Ambulatory Visit (INDEPENDENT_AMBULATORY_CARE_PROVIDER_SITE_OTHER): Payer: Medicare Other | Admitting: Internal Medicine

## 2012-06-26 VITALS — BP 118/70 | HR 76 | Temp 96.6°F | Resp 16 | Wt 149.0 lb

## 2012-06-26 DIAGNOSIS — I10 Essential (primary) hypertension: Secondary | ICD-10-CM | POA: Diagnosis not present

## 2012-06-26 DIAGNOSIS — R05 Cough: Secondary | ICD-10-CM

## 2012-06-26 DIAGNOSIS — R059 Cough, unspecified: Secondary | ICD-10-CM

## 2012-06-26 DIAGNOSIS — J449 Chronic obstructive pulmonary disease, unspecified: Secondary | ICD-10-CM | POA: Diagnosis not present

## 2012-06-26 DIAGNOSIS — R413 Other amnesia: Secondary | ICD-10-CM | POA: Diagnosis not present

## 2012-06-26 DIAGNOSIS — J069 Acute upper respiratory infection, unspecified: Secondary | ICD-10-CM

## 2012-06-26 MED ORDER — PROMETHAZINE-CODEINE 6.25-10 MG/5ML PO SYRP: 5.0000 mL | ORAL_SOLUTION | ORAL | Status: DC | PRN

## 2012-06-26 MED ORDER — CEFUROXIME AXETIL 250 MG PO TABS: 250.0000 mg | ORAL_TABLET | Freq: Two times a day (BID) | ORAL | Status: DC

## 2012-06-26 MED ORDER — CEFTRIAXONE SODIUM 1 G IJ SOLR
1.0000 g | Freq: Once | INTRAMUSCULAR | Status: AC
  Administered 2012-06-26: 1 g via INTRAMUSCULAR

## 2012-06-26 NOTE — Assessment & Plan Note (Signed)
CXR Prom-cod Ceftin Rocephin 1 g

## 2012-06-26 NOTE — Patient Instructions (Addendum)
Use over-the-counter  "cold" medicines  such as  "Afrin" nasal spray for nasal congestion as directed instead. Use" Delsym" or" Robitussin" cough syrup varietis for cough.  You can use plain "Tylenol" or "Advil" for fever, chills and achyness.  Please, make an appointment if you are not better or if you're worse.  

## 2012-06-26 NOTE — Telephone Encounter (Signed)
[name] [name], please, inform patient that there was no pneumonia on CXR Thx

## 2012-06-26 NOTE — Progress Notes (Signed)
Patient ID: [name] [name], male   DOB: 06/13/1922, 79 y.o.   MRN: [id number]   Subjective:       Cough This is a new problem. The current episode started in the past 7 days. The problem has been gradually worsening. The problem occurs every few minutes. The cough is productive of brown sputum. Pertinent negatives include no chest pain, fever, rash, sore throat, shortness of breath, weight loss or wheezing. He has tried OTC cough suppressant for the symptoms. The treatment provided no relief.     The patient presents for a follow-up of  hypertension, chronic dyslipidemia, CAD, dementia controlled with medicines F/u on low Na   Wt Readings from Last 3 Encounters:  06/26/12 149 lb (67.586 kg)  05/19/12 153 lb (69.4 kg)  02/14/12 153 lb (69.4 kg)   BP Readings from Last 3 Encounters:  06/26/12 118/70  05/19/12 120/70  02/14/12 132/70      Review of Systems  Constitutional: Negative for fever, weight loss, appetite change, fatigue and unexpected weight change.  HENT: Negative for nosebleeds, congestion, sore throat, sneezing, trouble swallowing and neck pain.   Eyes: Negative for itching and visual disturbance.  Respiratory: Positive for cough. Negative for shortness of breath and wheezing.   Cardiovascular: Negative for chest pain, palpitations and leg swelling.  Gastrointestinal: Negative for nausea, vomiting, diarrhea, constipation, blood in stool, abdominal distention and rectal pain.  Genitourinary: Negative for frequency and hematuria.  Musculoskeletal: Negative for joint swelling and gait problem.  Skin: Negative for rash.  Neurological: Negative for dizziness, tremors and speech difficulty.  Psychiatric/Behavioral: Negative for sleep disturbance, dysphoric mood and agitation. The patient is not nervous/anxious.        Objective:   Physical Exam  Constitutional: He is oriented to person, place, and time. He appears well-developed and well-nourished.  HENT:   Mouth/Throat: Oropharynx is clear and moist.  Eyes: Conjunctivae are normal. Pupils are equal, round, and reactive to light.  Neck: Normal range of motion. No JVD present. No thyromegaly present.  Cardiovascular: Normal rate, regular rhythm, normal heart sounds and intact distal pulses.  Exam reveals no gallop and no friction rub.   No murmur heard. Pulmonary/Chest: Effort normal and breath sounds normal. No respiratory distress. He has no wheezes. He has no rales. He exhibits no tenderness.  Abdominal: Soft. Bowel sounds are normal. He exhibits no distension and no mass. There is no tenderness. There is no rebound and no guarding.  Musculoskeletal: Normal range of motion. He exhibits tenderness (T12 is tender). He exhibits no edema.  Lymphadenopathy:    He has no cervical adenopathy.  Neurological: He is alert and oriented to person, place, and time. He has normal reflexes. No cranial nerve deficit. He exhibits normal muscle tone. Coordination normal.  Skin: Skin is warm and dry. No rash noted.  Psychiatric: He has a normal mood and affect. His behavior is normal. Judgment and thought content normal.   Lab Results  Component Value Date   WBC 9.9 02/14/2012   HGB 13.4 02/14/2012   HCT 40.5 02/14/2012   PLT 172.0 02/14/2012   GLUCOSE 84 05/19/2012   CHOL 104 11/28/2011   TRIG 44.0 11/28/2011   HDL 37.30* 11/28/2011   LDLCALC 58 11/28/2011   ALT 16 12/29/2011   AST 23 12/29/2011   NA 135 05/19/2012   K 4.9 05/19/2012   CL 101 05/19/2012   CREATININE 1.7* 05/19/2012   BUN 35* 05/19/2012   CO2 25 05/19/2012   TSH 4.012  12/30/2011   PSA 3.42 11/28/2011   INR 1.1 ratio* 08/17/2009          Assessment & Plan:

## 2012-06-26 NOTE — Telephone Encounter (Signed)
Pt informed

## 2012-06-27 ENCOUNTER — Encounter: Payer: Self-pay | Admitting: Internal Medicine

## 2012-06-27 NOTE — Assessment & Plan Note (Signed)
Doing well 

## 2012-06-27 NOTE — Assessment & Plan Note (Signed)
IM Rocephin PO abx Prom cod syr

## 2012-06-27 NOTE — Assessment & Plan Note (Signed)
Continue with current prescription therapy as reflected on the Med list.  

## 2012-08-18 ENCOUNTER — Encounter: Payer: Medicare Other | Admitting: *Deleted

## 2012-08-24 ENCOUNTER — Encounter: Payer: Self-pay | Admitting: *Deleted

## 2012-08-25 ENCOUNTER — Encounter: Payer: Self-pay | Admitting: Internal Medicine

## 2012-08-25 ENCOUNTER — Ambulatory Visit (INDEPENDENT_AMBULATORY_CARE_PROVIDER_SITE_OTHER): Payer: Medicare Other | Admitting: *Deleted

## 2012-08-25 DIAGNOSIS — Z95810 Presence of automatic (implantable) cardiac defibrillator: Secondary | ICD-10-CM | POA: Diagnosis not present

## 2012-08-25 DIAGNOSIS — I472 Ventricular tachycardia: Secondary | ICD-10-CM | POA: Diagnosis not present

## 2012-08-25 DIAGNOSIS — I5022 Chronic systolic (congestive) heart failure: Secondary | ICD-10-CM | POA: Diagnosis not present

## 2012-08-25 DIAGNOSIS — I4729 Other ventricular tachycardia: Secondary | ICD-10-CM

## 2012-08-28 LAB — REMOTE ICD DEVICE
AL AMPLITUDE: 2 mv
AL IMPEDENCE ICD: 494 Ohm
ATRIAL PACING ICD: 95.28 pct
CHARGE TIME: 9.729 s
RV LEAD AMPLITUDE: 9.1 mv
RV LEAD IMPEDENCE ICD: 380 Ohm
TOT-0001: 1
TOT-0002: 0
TOT-0006: 20110602000000
TZAT-0001ATACH: 2
TZAT-0001SLOWVT: 1
TZAT-0001SLOWVT: 2
TZAT-0002ATACH: NEGATIVE
TZAT-0002ATACH: NEGATIVE
TZAT-0002ATACH: NEGATIVE
TZAT-0002FASTVT: NEGATIVE
TZAT-0005SLOWVT: 88 pct
TZAT-0005SLOWVT: 91 pct
TZAT-0012FASTVT: 200 ms
TZAT-0018ATACH: NEGATIVE
TZAT-0018FASTVT: NEGATIVE
TZAT-0018SLOWVT: NEGATIVE
TZAT-0018SLOWVT: NEGATIVE
TZAT-0019ATACH: 6 V
TZAT-0019ATACH: 6 V
TZAT-0019ATACH: 6 V
TZAT-0019FASTVT: 8 V
TZAT-0019SLOWVT: 8 V
TZAT-0019SLOWVT: 8 V
TZON-0004VSLOWVT: 32
TZON-0005SLOWVT: 12
TZST-0001ATACH: 5
TZST-0001ATACH: 6
TZST-0001FASTVT: 3
TZST-0001FASTVT: 5
TZST-0001SLOWVT: 3
TZST-0002ATACH: NEGATIVE
TZST-0002FASTVT: NEGATIVE
TZST-0002FASTVT: NEGATIVE
TZST-0002FASTVT: NEGATIVE
TZST-0003SLOWVT: 35 J

## 2012-09-15 ENCOUNTER — Encounter: Payer: Self-pay | Admitting: Internal Medicine

## 2012-09-15 ENCOUNTER — Telehealth: Payer: Self-pay | Admitting: Internal Medicine

## 2012-09-15 NOTE — Telephone Encounter (Signed)
09-02-12 457pm line busy x 4, pt needs device appt to reprgram device/mt 09-03-12 lmm @ 1101am/mt 09-15-12 sent appt letter/mt

## 2012-09-16 ENCOUNTER — Ambulatory Visit: Payer: Medicare Other | Admitting: Internal Medicine

## 2012-09-21 NOTE — Telephone Encounter (Deleted)
error 

## 2012-09-23 ENCOUNTER — Encounter: Payer: Self-pay | Admitting: Internal Medicine

## 2012-09-23 ENCOUNTER — Ambulatory Visit (INDEPENDENT_AMBULATORY_CARE_PROVIDER_SITE_OTHER): Payer: Medicare Other | Admitting: Internal Medicine

## 2012-09-23 ENCOUNTER — Other Ambulatory Visit (INDEPENDENT_AMBULATORY_CARE_PROVIDER_SITE_OTHER): Payer: Medicare Other

## 2012-09-23 VITALS — BP 130/80 | HR 80 | Temp 97.1°F | Resp 16 | Wt 153.0 lb

## 2012-09-23 DIAGNOSIS — J449 Chronic obstructive pulmonary disease, unspecified: Secondary | ICD-10-CM

## 2012-09-23 DIAGNOSIS — I10 Essential (primary) hypertension: Secondary | ICD-10-CM

## 2012-09-23 DIAGNOSIS — R413 Other amnesia: Secondary | ICD-10-CM

## 2012-09-23 DIAGNOSIS — K624 Stenosis of anus and rectum: Secondary | ICD-10-CM

## 2012-09-23 DIAGNOSIS — F4321 Adjustment disorder with depressed mood: Secondary | ICD-10-CM | POA: Diagnosis not present

## 2012-09-23 DIAGNOSIS — IMO0001 Reserved for inherently not codable concepts without codable children: Secondary | ICD-10-CM

## 2012-09-23 LAB — BASIC METABOLIC PANEL
BUN: 26 mg/dL — ABNORMAL HIGH (ref 6–23)
CO2: 25 mEq/L (ref 19–32)
Calcium: 9.2 mg/dL (ref 8.4–10.5)
Creatinine, Ser: 1.5 mg/dL (ref 0.4–1.5)
GFR: 45.53 mL/min — ABNORMAL LOW (ref >60.00)
Glucose, Bld: 95 mg/dL (ref 70–99)

## 2012-09-23 MED ORDER — DONEPEZIL HCL 10 MG PO TABS: 10.0000 mg | ORAL_TABLET | Freq: Every day | ORAL | Status: DC

## 2012-09-23 MED ORDER — UMECLIDINIUM-VILANTEROL 62.5-25 MCG/INH IN AEPB: 1.0000 | INHALATION_SPRAY | Freq: Every day | RESPIRATORY_TRACT | Status: DC

## 2012-09-23 NOTE — Assessment & Plan Note (Signed)
Continue with current prescription therapy as reflected on the Med list.  

## 2012-09-23 NOTE — Assessment & Plan Note (Signed)
H/o smoking and asbestos exposure I personally provided Anoro inhaler use teaching. After the teaching patient was able to demonstrate it's use effectively. All questions were answered

## 2012-09-23 NOTE — Progress Notes (Signed)
Subjective:       Cough This is a new problem. The current episode started more than 1 month ago. The problem has been unchanged. The problem occurs every few hours. The cough is productive of brown sputum. Pertinent negatives include no chest pain, fever, rash, sore throat, shortness of breath, weight loss or wheezing. He has tried OTC cough suppressant for the symptoms. The treatment provided no relief.     The patient presents for a follow-up of  hypertension, chronic dyslipidemia, CAD, dementia controlled with medicines F/u on low Na   Wt Readings from Last 3 Encounters:  09/23/12 153 lb (69.4 kg)  06/26/12 149 lb (67.586 kg)  05/19/12 153 lb (69.4 kg)   BP Readings from Last 3 Encounters:  09/23/12 130/80  06/26/12 118/70  05/19/12 120/70      Review of Systems  Constitutional: Negative for fever, weight loss, appetite change, fatigue and unexpected weight change.  HENT: Negative for nosebleeds, congestion, sore throat, sneezing, trouble swallowing and neck pain.   Eyes: Negative for itching and visual disturbance.  Respiratory: Positive for cough. Negative for shortness of breath and wheezing.   Cardiovascular: Negative for chest pain, palpitations and leg swelling.  Gastrointestinal: Negative for nausea, vomiting, diarrhea, constipation, blood in stool, abdominal distention and rectal pain.  Genitourinary: Negative for frequency and hematuria.  Musculoskeletal: Negative for joint swelling and gait problem.  Skin: Negative for rash.  Neurological: Negative for dizziness, tremors and speech difficulty.  Psychiatric/Behavioral: Negative for sleep disturbance, dysphoric mood and agitation. The patient is not nervous/anxious.        Objective:   Physical Exam  Constitutional: He is oriented to person, place, and time. He appears well-developed and well-nourished.  HENT:  Mouth/Throat: Oropharynx is clear and moist.  Eyes: Conjunctivae are normal. Pupils are  equal, round, and reactive to light.  Neck: Normal range of motion. No JVD present. No thyromegaly present.  Cardiovascular: Normal rate, regular rhythm, normal heart sounds and intact distal pulses.  Exam reveals no gallop and no friction rub.   No murmur heard. Pulmonary/Chest: Effort normal and breath sounds normal. No respiratory distress. He has no wheezes. He has no rales. He exhibits no tenderness.  Abdominal: Soft. Bowel sounds are normal. He exhibits no distension and no mass. There is no tenderness. There is no rebound and no guarding.  Musculoskeletal: Normal range of motion. He exhibits tenderness (T12 is tender). He exhibits no edema.  Lymphadenopathy:    He has no cervical adenopathy.  Neurological: He is alert and oriented to person, place, and time. He has normal reflexes. No cranial nerve deficit. He exhibits normal muscle tone. Coordination normal.  Skin: Skin is warm and dry. No rash noted.  Psychiatric: He has a normal mood and affect. His behavior is normal. Judgment and thought content normal.   Lab Results  Component Value Date   WBC 9.9 02/14/2012   HGB 13.4 02/14/2012   HCT 40.5 02/14/2012   PLT 172.0 02/14/2012   GLUCOSE 84 05/19/2012   CHOL 104 11/28/2011   TRIG 44.0 11/28/2011   HDL 37.30* 11/28/2011   LDLCALC 58 11/28/2011   ALT 16 12/29/2011   AST 23 12/29/2011   NA 135 05/19/2012   K 4.9 05/19/2012   CL 101 05/19/2012   CREATININE 1.7* 05/19/2012   BUN 35* 05/19/2012   CO2 25 05/19/2012   TSH 4.012 12/30/2011   PSA 3.42 11/28/2011   INR 1.1 ratio* 08/17/2009    I personally provided  Anoro inhaler use teaching. After the teaching patient was able to demonstrate it's use effectively. All questions were answered       Assessment & Plan:

## 2012-09-23 NOTE — Assessment & Plan Note (Signed)
Better  

## 2012-09-24 ENCOUNTER — Ambulatory Visit (INDEPENDENT_AMBULATORY_CARE_PROVIDER_SITE_OTHER): Payer: Medicare Other | Admitting: *Deleted

## 2012-09-24 DIAGNOSIS — I428 Other cardiomyopathies: Secondary | ICD-10-CM

## 2012-09-25 ENCOUNTER — Other Ambulatory Visit: Payer: Self-pay | Admitting: Internal Medicine

## 2012-09-25 DIAGNOSIS — I10 Essential (primary) hypertension: Secondary | ICD-10-CM

## 2012-10-30 ENCOUNTER — Telehealth: Payer: Self-pay | Admitting: *Deleted

## 2012-10-30 NOTE — Telephone Encounter (Signed)
Rec fax from pharmacy stating Anoro Ellipta Inhaler is $324.88. Please advise any cheaper alternatives?

## 2012-10-30 NOTE — Telephone Encounter (Signed)
There is no inexpensive inhalers available. [facility] would cover a nebulizer for home use. Would he like to have one? I'll write a Rx. Thx

## 2012-11-02 NOTE — Telephone Encounter (Signed)
Pt called back ask to speak to the nurse. Inform pt that Rx is ready, pt stated he will come pick it up. Please advise.

## 2012-11-02 NOTE — Telephone Encounter (Signed)
Left detailed mess informing pt of below. Rx is at my desk waiting for pt to call back.

## 2012-11-03 NOTE — Telephone Encounter (Signed)
Pt picked up Rx for nebulizer and duoneb.

## 2012-11-30 ENCOUNTER — Ambulatory Visit (INDEPENDENT_AMBULATORY_CARE_PROVIDER_SITE_OTHER): Payer: Medicare Other | Admitting: *Deleted

## 2012-11-30 DIAGNOSIS — I5022 Chronic systolic (congestive) heart failure: Secondary | ICD-10-CM | POA: Diagnosis not present

## 2012-11-30 DIAGNOSIS — Z95810 Presence of automatic (implantable) cardiac defibrillator: Secondary | ICD-10-CM | POA: Diagnosis not present

## 2012-12-07 DIAGNOSIS — Z23 Encounter for immunization: Secondary | ICD-10-CM | POA: Diagnosis not present

## 2012-12-09 LAB — REMOTE ICD DEVICE
AL AMPLITUDE: 2 mv
AL IMPEDENCE ICD: 494 Ohm
ATRIAL PACING ICD: 96.67 pct
CHARGE TIME: 9.729 s
FVT: 0
RV LEAD IMPEDENCE ICD: 418 Ohm
TOT-0001: 1
TOT-0002: 0
TOT-0006: 20110602000000
TZAT-0001SLOWVT: 1
TZAT-0001SLOWVT: 2
TZAT-0002ATACH: NEGATIVE
TZAT-0002ATACH: NEGATIVE
TZAT-0002ATACH: NEGATIVE
TZAT-0002FASTVT: NEGATIVE
TZAT-0005SLOWVT: 88 pct
TZAT-0012FASTVT: 200 ms
TZAT-0018ATACH: NEGATIVE
TZAT-0018SLOWVT: NEGATIVE
TZAT-0018SLOWVT: NEGATIVE
TZAT-0019ATACH: 6 V
TZAT-0019ATACH: 6 V
TZAT-0019ATACH: 6 V
TZAT-0019FASTVT: 8 V
TZAT-0019SLOWVT: 8 V
TZAT-0019SLOWVT: 8 V
TZON-0004VSLOWVT: 32
TZON-0005SLOWVT: 12
TZST-0001ATACH: 5
TZST-0001ATACH: 6
TZST-0001FASTVT: 3
TZST-0001FASTVT: 5
TZST-0001SLOWVT: 3
TZST-0001SLOWVT: 5
TZST-0002ATACH: NEGATIVE
TZST-0002ATACH: NEGATIVE
TZST-0002FASTVT: NEGATIVE
TZST-0002FASTVT: NEGATIVE
TZST-0002FASTVT: NEGATIVE
TZST-0003SLOWVT: 35 J

## 2012-12-22 ENCOUNTER — Encounter: Payer: Self-pay | Admitting: *Deleted

## 2012-12-26 ENCOUNTER — Encounter: Payer: Self-pay | Admitting: Internal Medicine

## 2013-01-25 ENCOUNTER — Encounter: Payer: Self-pay | Admitting: Internal Medicine

## 2013-01-25 ENCOUNTER — Ambulatory Visit (INDEPENDENT_AMBULATORY_CARE_PROVIDER_SITE_OTHER): Payer: Medicare Other | Admitting: Internal Medicine

## 2013-01-25 VITALS — BP 140/72 | HR 72 | Temp 96.7°F | Resp 16 | Wt 152.0 lb

## 2013-01-25 DIAGNOSIS — M545 Low back pain, unspecified: Secondary | ICD-10-CM

## 2013-01-25 DIAGNOSIS — J449 Chronic obstructive pulmonary disease, unspecified: Secondary | ICD-10-CM | POA: Diagnosis not present

## 2013-01-25 DIAGNOSIS — I10 Essential (primary) hypertension: Secondary | ICD-10-CM

## 2013-01-25 DIAGNOSIS — I2510 Atherosclerotic heart disease of native coronary artery without angina pectoris: Secondary | ICD-10-CM

## 2013-01-25 DIAGNOSIS — IMO0001 Reserved for inherently not codable concepts without codable children: Secondary | ICD-10-CM

## 2013-01-25 MED ORDER — ATORVASTATIN CALCIUM 20 MG PO TABS: 10.0000 mg | ORAL_TABLET | Freq: Two times a day (BID) | ORAL | Status: DC

## 2013-01-25 MED ORDER — IPRATROPIUM-ALBUTEROL 0.5-2.5 (3) MG/3ML IN SOLN: 3.0000 mL | Freq: Four times a day (QID) | RESPIRATORY_TRACT | Status: DC | PRN

## 2013-01-25 NOTE — Assessment & Plan Note (Signed)
Continue with current prescription therapy as reflected on the Med list.  

## 2013-01-25 NOTE — Assessment & Plan Note (Signed)
Continue with current prn prescription therapy as reflected on the Med list.  

## 2013-01-25 NOTE — Progress Notes (Signed)
   Subjective:       HPI   The patient presents for a follow-up of  hypertension, chronic dyslipidemia, CAD, dementia controlled with medicines F/u on low Na   Wt Readings from Last 3 Encounters:  01/25/13 152 lb (68.947 kg)  09/23/12 153 lb (69.4 kg)  06/26/12 149 lb (67.586 kg)   BP Readings from Last 3 Encounters:  01/25/13 140/72  09/23/12 130/80  06/26/12 118/70      Review of Systems  Constitutional: Negative for appetite change, fatigue and unexpected weight change.  HENT: Negative for congestion, nosebleeds, sneezing and trouble swallowing.   Eyes: Negative for itching and visual disturbance.  Cardiovascular: Negative for palpitations and leg swelling.  Gastrointestinal: Negative for nausea, vomiting, diarrhea, constipation, blood in stool, abdominal distention and rectal pain.  Genitourinary: Negative for frequency and hematuria.  Musculoskeletal: Negative for gait problem, joint swelling and neck pain.  Neurological: Negative for dizziness, tremors and speech difficulty.  Psychiatric/Behavioral: Negative for sleep disturbance, dysphoric mood and agitation. The patient is not nervous/anxious.        Objective:   Physical Exam  Constitutional: He is oriented to person, place, and time. He appears well-developed and well-nourished.  HENT:  Mouth/Throat: Oropharynx is clear and moist.  Eyes: Conjunctivae are normal. Pupils are equal, round, and reactive to light.  Neck: Normal range of motion. No JVD present. No thyromegaly present.  Cardiovascular: Normal rate, regular rhythm, normal heart sounds and intact distal pulses.  Exam reveals no gallop and no friction rub.   No murmur heard. Pulmonary/Chest: Effort normal and breath sounds normal. No respiratory distress. He has no wheezes. He has no rales. He exhibits no tenderness.  Abdominal: Soft. Bowel sounds are normal. He exhibits no distension and no mass. There is no tenderness. There is no rebound and no  guarding.  Musculoskeletal: Normal range of motion. He exhibits tenderness (T12 is tender). He exhibits no edema.  Lymphadenopathy:    He has no cervical adenopathy.  Neurological: He is alert and oriented to person, place, and time. He has normal reflexes. No cranial nerve deficit. He exhibits normal muscle tone. Coordination normal.  Skin: Skin is warm and dry. No rash noted.  Psychiatric: He has a normal mood and affect. His behavior is normal. Judgment and thought content normal.   Lab Results  Component Value Date   WBC 9.9 02/14/2012   HGB 13.4 02/14/2012   HCT 40.5 02/14/2012   PLT 172.0 02/14/2012   GLUCOSE 95 09/23/2012   CHOL 104 11/28/2011   TRIG 44.0 11/28/2011   HDL 37.30* 11/28/2011   LDLCALC 58 11/28/2011   ALT 16 12/29/2011   AST 23 12/29/2011   NA 131* 09/23/2012   K 4.6 09/23/2012   CL 97 09/23/2012   CREATININE 1.5 09/23/2012   BUN 26* 09/23/2012   CO2 25 09/23/2012   TSH 4.012 12/30/2011   PSA 3.42 11/28/2011   INR 1.1 ratio* 08/17/2009          Assessment & Plan:

## 2013-01-25 NOTE — Assessment & Plan Note (Signed)
On HHN now

## 2013-02-02 ENCOUNTER — Encounter: Payer: Self-pay | Admitting: Internal Medicine

## 2013-02-02 ENCOUNTER — Ambulatory Visit (INDEPENDENT_AMBULATORY_CARE_PROVIDER_SITE_OTHER): Payer: Medicare Other | Admitting: Internal Medicine

## 2013-02-02 VITALS — BP 141/81 | HR 77 | Ht 68.0 in | Wt 153.0 lb

## 2013-02-02 DIAGNOSIS — I10 Essential (primary) hypertension: Secondary | ICD-10-CM | POA: Diagnosis not present

## 2013-02-02 DIAGNOSIS — I495 Sick sinus syndrome: Secondary | ICD-10-CM

## 2013-02-02 DIAGNOSIS — I2510 Atherosclerotic heart disease of native coronary artery without angina pectoris: Secondary | ICD-10-CM

## 2013-02-02 DIAGNOSIS — I5022 Chronic systolic (congestive) heart failure: Secondary | ICD-10-CM | POA: Diagnosis not present

## 2013-02-02 DIAGNOSIS — Z95810 Presence of automatic (implantable) cardiac defibrillator: Secondary | ICD-10-CM

## 2013-02-02 LAB — MDC_IDC_ENUM_SESS_TYPE_INCLINIC
Battery Voltage: 3.01 V
Brady Statistic AP VP Percent: 0.01 %
Brady Statistic AP VS Percent: 95 %
Brady Statistic AS VP Percent: 0 %
Date Time Interrogation Session: 20141111110406
HighPow Impedance: 41 Ohm
HighPow Impedance: 59 Ohm
Lead Channel Impedance Value: 418 Ohm
Lead Channel Impedance Value: 532 Ohm
Lead Channel Pacing Threshold Amplitude: 0.75 V
Lead Channel Pacing Threshold Pulse Width: 0.4 ms
Lead Channel Sensing Intrinsic Amplitude: 11.375 mV
Lead Channel Sensing Intrinsic Amplitude: 3 mV
Lead Channel Setting Pacing Amplitude: 2 V
Lead Channel Setting Pacing Amplitude: 2.5 V
Lead Channel Setting Pacing Pulse Width: 0.4 ms
Lead Channel Setting Sensing Sensitivity: 0.45 mV
Zone Setting Detection Interval: 300 ms
Zone Setting Detection Interval: 350 ms
Zone Setting Detection Interval: 410 ms

## 2013-02-02 NOTE — Patient Instructions (Signed)
Your physician recommends that you continue on your current medications as directed. Please refer to the Current Medication list given to you today.  Remote monitoring is used to monitor your Pacemaker of ICD from home. This monitoring reduces the number of office visits required to check your device to one time per year. It allows [place] to keep an eye on the functioning of your device to ensure it is working properly. You are scheduled for a device check from home on [employer] 05/06/2013. You may send your transmission at any time that day. If you have a wireless device, the transmission will be sent automatically. After your physician reviews your transmission, you will receive a postcard with your next transmission date.  Your physician wants you to follow-up in: 12 months with Dr. Ladona Ridgel. You will receive a reminder letter in the mail two months in advance. If you don't receive a letter, please call our office to schedule the follow-up appointment.

## 2013-02-02 NOTE — Assessment & Plan Note (Signed)
His heart failure symptoms remain class II. He'll continue his current medical therapy. No change in medications at this time.

## 2013-02-02 NOTE — Assessment & Plan Note (Signed)
He is atrial pacing ulcer 100% of the time.

## 2013-02-02 NOTE — Progress Notes (Signed)
HPI [name] [name] returns today for followup. He is a pleasant elderly man with an ICM, chronic systolic CHF, s/p ICD implant. In the interim he has been stable. He has class 2 symptoms. No syncope. No edema.  He notes that he occasionally uses his nebulizer to help with his wheezing. No Known Allergies   Current Outpatient Prescriptions  Medication Sig Dispense Refill  . aspirin 81 MG EC tablet Take 81 mg by mouth daily.        Marland Kitchen atorvastatin (LIPITOR) 20 MG tablet Take 0.5 tablets (10 mg total) by mouth 2 (two) times daily.  90 tablet  2  . Cholecalciferol (VITAMIN D3) 1000 UNITS tablet Take 1,000 Units by mouth daily.        Marland Kitchen donepezil (ARICEPT) 10 MG tablet Take 1 tablet (10 mg total) by mouth at bedtime.  90 tablet  2  . fluticasone (FLONASE) 50 MCG/ACT nasal spray Place 2 sprays into the nose daily.  48 g  3  . folic acid (FOLVITE) 800 MCG tablet Take 800 mcg by mouth daily.       Marland Kitchen ipratropium-albuterol (DUONEB) 0.5-2.5 (3) MG/3ML SOLN Take 3 mLs by nebulization every 6 (six) hours as needed. Via HHN  360 mL  3  . levothyroxine (SYNTHROID, LEVOTHROID) 100 MCG tablet Take 1 tablet (100 mcg total) by mouth daily.  90 tablet  3  . metoprolol succinate (TOPROL-XL) 25 MG 24 hr tablet Take 0.5 tablets (12.5 mg total) by mouth 2 (two) times daily. Take with or immediately following a meal.  90 tablet  3  . Misc Natural Products (OSTEO BI-FLEX ADV DOUBLE ST) TABS Take 1 tablet by mouth at bedtime.       . nitroGLYCERIN (NITROSTAT) 0.4 MG SL tablet Place 1 tablet (0.4 mg total) under the tongue every 5 (five) minutes as needed. Chest pain  30 tablet  2  . OVER THE COUNTER MEDICATION Place 1 spray into the nose 4 (four) times daily as needed. Nasal spray nasal congestion      . triamcinolone (NASACORT) 55 MCG/ACT nasal inhaler Place 1 spray into the nose daily.  3 Inhaler  1   No current facility-administered medications for this visit.     Past Medical History  Diagnosis Date  . Hypertension    . Hypothyroid   . CAD (coronary artery disease)     Dr. Ladona Ridgel  . Osteoarthritis   . BPH (benign prostatic hypertrophy)     Dr. Vonita Moss  . Osteoporosis     (took actonel x 5y)  . Renal insufficiency     Dr. Merrily Brittle  . Hip fx     x 2 on R.  . Sternum fx 2011  . Urinary incontinence   . Hearing loss     ROS:   All systems reviewed and negative except as noted in the HPI.   Past Surgical History  Procedure Laterality Date  . Heart disease      permanent pacemaker  . Appendectomy    . Inguinal hernia repair    . Hip fracture surgery  2011    ORIF  R.  . Pacemaker insertion    . Colonoscopy  12/1998, 02/2004    diverticulosois, external hemorrhoids     Family History  Problem Relation Age of Onset  . Coronary artery disease      male 1st degree relative<60  . Hypertension    . Cancer Sister      History   Social History  .  Marital Status: Married    Spouse Name: N/A    Number of Children: 2  . Years of Education: N/A   Occupational History  . retired    Social History Main Topics  . Smoking status: Former Smoker -- 1.00 packs/day for 30 years    Quit date: 12/29/1950  . Smokeless tobacco: Never Used  . Alcohol Use: No  . Drug Use: No  . Sexual Activity: Not Currently   Other Topics Concern  . Not on file   Social History Narrative   Regular exercise--no.     BP 141/81  Pulse 77  Ht 5\' 8"  (1.727 m)  Wt 153 lb (69.4 kg)  BMI 23.27 kg/m2  Physical Exam:  Well appearing elderly man, NAD HEENT: Unremarkable Neck:   6 cm JVD, no thyromegally Lungs:  Clear with no wheezes, rales, or rhonchi HEART:  Regular rate rhythm, no murmurs, no rubs, no clicks Abd:  soft, positive bowel sounds, no organomegally, no rebound, no guarding Ext:  2 plus pulses, no edema, no cyanosis, no clubbing Skin:  No rashes no nodules Neuro:  CN II through XII intact, motor grossly intact   ECG -  Sinus rhythm with atrial pacing  DEVICE  Normal device  function.  See PaceArt for details.   Assess/Plan:

## 2013-02-02 NOTE — Assessment & Plan Note (Signed)
His Medtronic dual-chamber ICD is working normally. We'll continue  followup in several months.

## 2013-02-05 ENCOUNTER — Encounter: Payer: Self-pay | Admitting: Internal Medicine

## 2013-02-23 DIAGNOSIS — H43399 Other vitreous opacities, unspecified eye: Secondary | ICD-10-CM | POA: Diagnosis not present

## 2013-02-23 DIAGNOSIS — H26499 Other secondary cataract, unspecified eye: Secondary | ICD-10-CM | POA: Diagnosis not present

## 2013-03-23 ENCOUNTER — Other Ambulatory Visit: Payer: Self-pay | Admitting: Internal Medicine

## 2013-03-24 ENCOUNTER — Other Ambulatory Visit: Payer: Self-pay | Admitting: *Deleted

## 2013-03-24 MED ORDER — ATORVASTATIN CALCIUM 20 MG PO TABS: 10.0000 mg | ORAL_TABLET | Freq: Two times a day (BID) | ORAL | Status: DC

## 2013-04-02 ENCOUNTER — Telehealth: Payer: Self-pay | Admitting: Internal Medicine

## 2013-04-02 MED ORDER — AMBULATORY NON FORMULARY MEDICATION: Status: DC

## 2013-04-02 NOTE — Telephone Encounter (Signed)
Ok to refill the diltiazem gel as previously prescribed

## 2013-04-02 NOTE — Telephone Encounter (Signed)
Patient informed that Diltiazem gel phoned into [facility] in [place] at [phone] , pt given pharmacy hours.  He will keep his upcoming appointment.

## 2013-04-02 NOTE — Telephone Encounter (Signed)
Appointment is Jan. 30th, please advise regarding refill Sir, thank you.

## 2013-04-23 ENCOUNTER — Encounter: Payer: Self-pay | Admitting: Internal Medicine

## 2013-04-23 ENCOUNTER — Ambulatory Visit (INDEPENDENT_AMBULATORY_CARE_PROVIDER_SITE_OTHER): Payer: Medicare Other | Admitting: Internal Medicine

## 2013-04-23 VITALS — BP 114/68 | HR 72 | Ht 66.0 in | Wt 151.5 lb

## 2013-04-23 DIAGNOSIS — K624 Stenosis of anus and rectum: Secondary | ICD-10-CM | POA: Diagnosis not present

## 2013-04-23 MED ORDER — DILTIAZEM GEL 2 %: 1.0000 "application " | Freq: Three times a day (TID) | CUTANEOUS | Status: DC

## 2013-04-23 NOTE — Progress Notes (Signed)
         Subjective:    Patient ID: [name] [name], male    DOB: 1922-09-03, 80 y.o.   MRN: [id number]  HPI Patient is here to discuss refilling diltiazem gel for his chronic recurrent anal stenosis. That has provided symptomatic relief over the years. He says that he had filled in [place] and they prescribed an ointment and he prefers the gel that we can get from [facility].  Medications, allergies, past medical history, past surgical history, family history and social history are reviewed and updated in the EMR.  Review of Systems As above    Objective:   Physical Exam Elderly white man in no acute distress    Assessment & Plan:   1. Stenosis, anal canal

## 2013-04-23 NOTE — Patient Instructions (Signed)
We have sent the following medications to [facility] for you to pick up at your convenience: Dilitazem gel  Follow up with [place] as needed.   I appreciate the opportunity to care for you.

## 2013-04-23 NOTE — Assessment & Plan Note (Signed)
Refill diltiazem gel See me prn PCP may refill or I can up to 2 years

## 2013-05-06 ENCOUNTER — Encounter: Payer: Self-pay | Admitting: Internal Medicine

## 2013-05-06 ENCOUNTER — Ambulatory Visit (INDEPENDENT_AMBULATORY_CARE_PROVIDER_SITE_OTHER): Payer: Medicare Other | Admitting: *Deleted

## 2013-05-06 DIAGNOSIS — I495 Sick sinus syndrome: Secondary | ICD-10-CM | POA: Diagnosis not present

## 2013-05-06 DIAGNOSIS — I428 Other cardiomyopathies: Secondary | ICD-10-CM

## 2013-05-06 DIAGNOSIS — I5022 Chronic systolic (congestive) heart failure: Secondary | ICD-10-CM

## 2013-05-12 ENCOUNTER — Encounter: Payer: Self-pay | Admitting: *Deleted

## 2013-05-13 LAB — MDC_IDC_ENUM_SESS_TYPE_REMOTE
Battery Voltage: 2.99 V
Brady Statistic AP VP Percent: 0.01 %
Brady Statistic AS VP Percent: 0 %
Brady Statistic AS VS Percent: 1.5 %
Brady Statistic RA Percent Paced: 98.5 %
Brady Statistic RV Percent Paced: 0.01 %
Date Time Interrogation Session: 20150212214038
HIGH POWER IMPEDANCE MEASURED VALUE: 41 Ohm
HIGH POWER IMPEDANCE MEASURED VALUE: 60 Ohm
Lead Channel Sensing Intrinsic Amplitude: 2.25 mV
Lead Channel Sensing Intrinsic Amplitude: 2.25 mV
Lead Channel Setting Pacing Amplitude: 2.5 V
Lead Channel Setting Pacing Pulse Width: 0.4 ms
MDC IDC MSMT LEADCHNL RA IMPEDANCE VALUE: 494 Ohm
MDC IDC MSMT LEADCHNL RV IMPEDANCE VALUE: 418 Ohm
MDC IDC MSMT LEADCHNL RV SENSING INTR AMPL: 9.5 mV
MDC IDC MSMT LEADCHNL RV SENSING INTR AMPL: 9.5 mV
MDC IDC SET LEADCHNL RA PACING AMPLITUDE: 2 V
MDC IDC SET LEADCHNL RV SENSING SENSITIVITY: 0.45 mV
MDC IDC SET ZONE DETECTION INTERVAL: 410 ms
MDC IDC STAT BRADY AP VS PERCENT: 98.49 %
Zone Setting Detection Interval: 300 ms
Zone Setting Detection Interval: 350 ms
Zone Setting Detection Interval: 380 ms

## 2013-05-19 ENCOUNTER — Encounter: Payer: Self-pay | Admitting: *Deleted

## 2013-05-25 ENCOUNTER — Encounter: Payer: Self-pay | Admitting: Internal Medicine

## 2013-05-25 ENCOUNTER — Other Ambulatory Visit: Payer: Self-pay

## 2013-05-25 ENCOUNTER — Ambulatory Visit (INDEPENDENT_AMBULATORY_CARE_PROVIDER_SITE_OTHER): Payer: Medicare Other | Admitting: Internal Medicine

## 2013-05-25 ENCOUNTER — Other Ambulatory Visit (INDEPENDENT_AMBULATORY_CARE_PROVIDER_SITE_OTHER): Payer: Medicare Other

## 2013-05-25 VITALS — BP 130/66 | HR 80 | Temp 96.7°F | Wt 155.0 lb

## 2013-05-25 DIAGNOSIS — M545 Low back pain, unspecified: Secondary | ICD-10-CM

## 2013-05-25 DIAGNOSIS — Z23 Encounter for immunization: Secondary | ICD-10-CM | POA: Diagnosis not present

## 2013-05-25 DIAGNOSIS — I10 Essential (primary) hypertension: Secondary | ICD-10-CM

## 2013-05-25 DIAGNOSIS — L853 Xerosis cutis: Secondary | ICD-10-CM

## 2013-05-25 DIAGNOSIS — L258 Unspecified contact dermatitis due to other agents: Secondary | ICD-10-CM

## 2013-05-25 DIAGNOSIS — J449 Chronic obstructive pulmonary disease, unspecified: Secondary | ICD-10-CM

## 2013-05-25 DIAGNOSIS — M1990 Unspecified osteoarthritis, unspecified site: Secondary | ICD-10-CM | POA: Diagnosis not present

## 2013-05-25 DIAGNOSIS — E039 Hypothyroidism, unspecified: Secondary | ICD-10-CM

## 2013-05-25 DIAGNOSIS — I2510 Atherosclerotic heart disease of native coronary artery without angina pectoris: Secondary | ICD-10-CM

## 2013-05-25 DIAGNOSIS — IMO0001 Reserved for inherently not codable concepts without codable children: Secondary | ICD-10-CM

## 2013-05-25 LAB — HEPATIC FUNCTION PANEL
ALBUMIN: 3.7 g/dL (ref 3.5–5.2)
ALT: 34 U/L (ref 0–53)
AST: 31 U/L (ref 0–37)
Alkaline Phosphatase: 114 U/L (ref 39–117)
Bilirubin, Direct: 0.1 mg/dL (ref 0.0–0.3)
Total Bilirubin: 0.9 mg/dL (ref 0.3–1.2)
Total Protein: 7.1 g/dL (ref 6.0–8.3)

## 2013-05-25 LAB — LIPID PANEL
Cholesterol: 110 mg/dL (ref 0–200)
HDL: 40.3 mg/dL (ref >39.00)
LDL CALC: 56 mg/dL (ref 0–99)
Total CHOL/HDL Ratio: 3
Triglycerides: 67 mg/dL (ref 0.0–149.0)
VLDL: 13.4 mg/dL (ref 0.0–40.0)

## 2013-05-25 LAB — BASIC METABOLIC PANEL
BUN: 29 mg/dL — ABNORMAL HIGH (ref 6–23)
CO2: 26 meq/L (ref 19–32)
Calcium: 8.9 mg/dL (ref 8.4–10.5)
Chloride: 101 mEq/L (ref 96–112)
Creatinine, Ser: 1.7 mg/dL — ABNORMAL HIGH (ref 0.4–1.5)
GFR: 41.12 mL/min — AB (ref >60.00)
GLUCOSE: 81 mg/dL (ref 70–99)
POTASSIUM: 4.8 meq/L (ref 3.5–5.1)
Sodium: 132 mEq/L — ABNORMAL LOW (ref 135–145)

## 2013-05-25 MED ORDER — METOPROLOL SUCCINATE ER 25 MG PO TB24: 12.5000 mg | ORAL_TABLET | Freq: Two times a day (BID) | ORAL | Status: DC

## 2013-05-25 MED ORDER — LEVOTHYROXINE SODIUM 100 MCG PO TABS: 100.0000 ug | ORAL_TABLET | Freq: Every day | ORAL | Status: DC

## 2013-05-25 MED ORDER — TRIAMCINOLONE ACETONIDE 0.5 % EX CREA: 1.0000 "application " | TOPICAL_CREAM | Freq: Three times a day (TID) | CUTANEOUS | Status: DC

## 2013-05-25 NOTE — Assessment & Plan Note (Signed)
Continue with current prescription therapy as reflected on the Med list.  

## 2013-05-25 NOTE — Assessment & Plan Note (Signed)
Tylenol prn 

## 2013-05-25 NOTE — Assessment & Plan Note (Signed)
Triamc prn

## 2013-05-25 NOTE — Progress Notes (Signed)
   Subjective:       HPI   The patient presents for a follow-up of  hypertension, chronic dyslipidemia, CAD, dementia controlled with medicines F/u on low Na   Wt Readings from Last 3 Encounters:  05/25/13 155 lb (70.308 kg)  04/23/13 151 lb 8 oz (68.72 kg)  02/02/13 153 lb (69.4 kg)   BP Readings from Last 3 Encounters:  05/25/13 130/66  04/23/13 114/68  02/02/13 141/81      Review of Systems  Constitutional: Negative for appetite change, fatigue and unexpected weight change.  HENT: Negative for congestion, nosebleeds, sneezing and trouble swallowing.   Eyes: Negative for itching and visual disturbance.  Cardiovascular: Negative for palpitations and leg swelling.  Gastrointestinal: Negative for nausea, vomiting, diarrhea, constipation, blood in stool, abdominal distention and rectal pain.  Genitourinary: Negative for frequency and hematuria.  Musculoskeletal: Negative for gait problem, joint swelling and neck pain.  Neurological: Negative for dizziness, tremors and speech difficulty.  Psychiatric/Behavioral: Negative for sleep disturbance, dysphoric mood and agitation. The patient is not nervous/anxious.        Objective:   Physical Exam  Constitutional: He is oriented to person, place, and time. He appears well-developed and well-nourished.  HENT:  Mouth/Throat: Oropharynx is clear and moist.  Eyes: Conjunctivae are normal. Pupils are equal, round, and reactive to light.  Neck: Normal range of motion. No JVD present. No thyromegaly present.  Cardiovascular: Normal rate, regular rhythm, normal heart sounds and intact distal pulses.  Exam reveals no gallop and no friction rub.   No murmur heard. Pulmonary/Chest: Effort normal and breath sounds normal. No respiratory distress. He has no wheezes. He has no rales. He exhibits no tenderness.  Abdominal: Soft. Bowel sounds are normal. He exhibits no distension and no mass. There is no tenderness. There is no rebound and  no guarding.  Musculoskeletal: Normal range of motion. He exhibits tenderness (T12 is tender). He exhibits no edema.  Lymphadenopathy:    He has no cervical adenopathy.  Neurological: He is alert and oriented to person, place, and time. He has normal reflexes. No cranial nerve deficit. He exhibits normal muscle tone. Coordination normal.  Skin: Skin is warm and dry. No rash noted.  Psychiatric: He has a normal mood and affect. His behavior is normal. Judgment and thought content normal.   Lab Results  Component Value Date   WBC 9.9 02/14/2012   HGB 13.4 02/14/2012   HCT 40.5 02/14/2012   PLT 172.0 02/14/2012   GLUCOSE 95 09/23/2012   CHOL 104 11/28/2011   TRIG 44.0 11/28/2011   HDL 37.30* 11/28/2011   LDLCALC 58 11/28/2011   ALT 16 12/29/2011   AST 23 12/29/2011   NA 131* 09/23/2012   K 4.6 09/23/2012   CL 97 09/23/2012   CREATININE 1.5 09/23/2012   BUN 26* 09/23/2012   CO2 25 09/23/2012   TSH 4.012 12/30/2011   PSA 3.42 11/28/2011   INR 1.1 ratio* 08/17/2009          Assessment & Plan:

## 2013-05-25 NOTE — Progress Notes (Signed)
Pre visit review using our clinic review tool, if applicable. No additional management support is needed unless otherwise documented below in the visit note. 

## 2013-07-13 ENCOUNTER — Other Ambulatory Visit: Payer: Self-pay | Admitting: *Deleted

## 2013-07-13 MED ORDER — FLUTICASONE PROPIONATE 50 MCG/ACT NA SUSP: 2.0000 | Freq: Every day | NASAL | Status: DC

## 2013-07-19 ENCOUNTER — Ambulatory Visit (INDEPENDENT_AMBULATORY_CARE_PROVIDER_SITE_OTHER): Payer: Medicare Other | Admitting: Internal Medicine

## 2013-07-19 ENCOUNTER — Ambulatory Visit (INDEPENDENT_AMBULATORY_CARE_PROVIDER_SITE_OTHER)
Admission: RE | Admit: 2013-07-19 | Discharge: 2013-07-19 | Disposition: A | Discharge status: Home / Self Care | Payer: Medicare Other | Source: Ambulatory Visit | Attending: Internal Medicine | Admitting: Internal Medicine

## 2013-07-19 ENCOUNTER — Encounter: Payer: Self-pay | Admitting: Internal Medicine

## 2013-07-19 VITALS — BP 130/76 | HR 70 | Temp 97.0°F | Wt 153.0 lb

## 2013-07-19 DIAGNOSIS — R0609 Other forms of dyspnea: Secondary | ICD-10-CM | POA: Diagnosis not present

## 2013-07-19 DIAGNOSIS — R0689 Other abnormalities of breathing: Secondary | ICD-10-CM

## 2013-07-19 DIAGNOSIS — I2510 Atherosclerotic heart disease of native coronary artery without angina pectoris: Secondary | ICD-10-CM

## 2013-07-19 DIAGNOSIS — J309 Allergic rhinitis, unspecified: Secondary | ICD-10-CM

## 2013-07-19 DIAGNOSIS — R0989 Other specified symptoms and signs involving the circulatory and respiratory systems: Secondary | ICD-10-CM

## 2013-07-19 NOTE — Progress Notes (Signed)
Pre visit review using our clinic review tool, if applicable. No additional management support is needed unless otherwise documented below in the visit note. 

## 2013-07-19 NOTE — Progress Notes (Signed)
   Subjective:    Patient ID: [name] [name], male    DOB: 21-Dec-1922, 80 y.o.   MRN: [id number]  HPI   Symptoms began 07/14/13 as sore throat. He believes this was related to pollen exposure & mowing his yard  He used Listerine, cough drops, Tylenol with partial response  At this time he continues to have some sore throat early in the morning which essentially resolves through the day.  He quit smoking at age 89.    Review of Systems  He specifically denies frontal headache, facial pain, dental pain, nasal purulence, earache, or otic discharge  He has no cough or sputum production. He denies any associated wheezing or shortness of breath  He has no fever, chills, sweats  He has no significant extrinsic symptoms of itchy, watery eyes, sneezing.     Objective:   Physical Exam General appearance:good health ;well nourished; no acute distress or increased work of breathing is present.  No  lymphadenopathy about the head, neck, or axilla noted. Appears younger than stated age  Eyes: No conjunctival inflammation or lid edema is present. There is no scleral icterus.  Ears:  External ear exam shows no significant lesions or deformities.  Otoscopic examination reveals wax R > L Nose:  External nasal examination shows no deformity or inflammation. Nasal mucosa are pink and moist without lesions or exudates. No septal dislocation or deviation.No obstruction to airflow.   Oral exam: Dental hygiene is good; lips and gums are healthy appearing.There is no oropharyngeal erythema or exudate noted.   Neck:  No deformities,  masses, or tenderness noted.   Supple with full range of motion without pain.   Heart:  Normal rate and regular rhythm. S1 and S2 normal without gallop, murmur, click, or rub. Distant heart sounds.   Lungs:Rales RLL>>>LLL.Marland KitchenNo increased work of breathing.    Extremities:  No cyanosis, edema, or clubbing  noted    Skin: Warm & dry w/o jaundice or tenting.           Assessment & Plan:  #1 allergic rhinitis  #2 abnormal breath sounds with no increased work of breathing  See orders

## 2013-07-19 NOTE — Patient Instructions (Signed)

## 2013-07-20 ENCOUNTER — Other Ambulatory Visit: Payer: Self-pay | Admitting: *Deleted

## 2013-07-20 MED ORDER — METOPROLOL SUCCINATE ER 25 MG PO TB24: 12.5000 mg | ORAL_TABLET | Freq: Two times a day (BID) | ORAL | Status: DC

## 2013-07-20 MED ORDER — LEVOTHYROXINE SODIUM 100 MCG PO TABS: 100.0000 ug | ORAL_TABLET | Freq: Every day | ORAL | Status: DC

## 2013-07-20 NOTE — Telephone Encounter (Signed)
A user error has taken place: open wrong chart. Pt has 2 charts...Johny Chess

## 2013-08-10 ENCOUNTER — Ambulatory Visit (INDEPENDENT_AMBULATORY_CARE_PROVIDER_SITE_OTHER): Payer: Medicare Other | Admitting: *Deleted

## 2013-08-10 ENCOUNTER — Encounter: Payer: Self-pay | Admitting: Internal Medicine

## 2013-08-10 ENCOUNTER — Telehealth: Payer: Self-pay | Admitting: Cardiology

## 2013-08-10 DIAGNOSIS — I495 Sick sinus syndrome: Secondary | ICD-10-CM

## 2013-08-10 NOTE — Progress Notes (Signed)
Remote ICD transmission.   

## 2013-08-10 NOTE — Telephone Encounter (Signed)
LMOVM reminding pt to send remote transmission.   

## 2013-08-12 LAB — MDC_IDC_ENUM_SESS_TYPE_REMOTE
Battery Voltage: 2.97 V
Brady Statistic AP VS Percent: 99.15 %
Brady Statistic RA Percent Paced: 99.15 %
Brady Statistic RV Percent Paced: 0.01 %
Date Time Interrogation Session: 20150519160020
HighPow Impedance: 43 Ohm
HighPow Impedance: 60 Ohm
Lead Channel Impedance Value: 418 Ohm
Lead Channel Impedance Value: 494 Ohm
Lead Channel Sensing Intrinsic Amplitude: 2 mV
Lead Channel Setting Pacing Amplitude: 2.5 V
MDC IDC MSMT LEADCHNL RA SENSING INTR AMPL: 2 mV
MDC IDC MSMT LEADCHNL RV SENSING INTR AMPL: 9.75 mV
MDC IDC MSMT LEADCHNL RV SENSING INTR AMPL: 9.75 mV
MDC IDC SET LEADCHNL RA PACING AMPLITUDE: 2 V
MDC IDC SET LEADCHNL RV PACING PULSEWIDTH: 0.4 ms
MDC IDC SET LEADCHNL RV SENSING SENSITIVITY: 0.45 mV
MDC IDC SET ZONE DETECTION INTERVAL: 380 ms
MDC IDC STAT BRADY AP VP PERCENT: 0.01 %
MDC IDC STAT BRADY AS VP PERCENT: 0 %
MDC IDC STAT BRADY AS VS PERCENT: 0.85 %
Zone Setting Detection Interval: 300 ms
Zone Setting Detection Interval: 350 ms
Zone Setting Detection Interval: 410 ms

## 2013-08-20 ENCOUNTER — Encounter: Payer: Self-pay | Admitting: Cardiology

## 2013-09-04 ENCOUNTER — Other Ambulatory Visit: Payer: Self-pay | Admitting: Internal Medicine

## 2013-09-28 ENCOUNTER — Encounter: Payer: Self-pay | Admitting: Internal Medicine

## 2013-09-28 ENCOUNTER — Other Ambulatory Visit (INDEPENDENT_AMBULATORY_CARE_PROVIDER_SITE_OTHER): Payer: Medicare Other

## 2013-09-28 ENCOUNTER — Ambulatory Visit (INDEPENDENT_AMBULATORY_CARE_PROVIDER_SITE_OTHER): Payer: Medicare Other | Admitting: Internal Medicine

## 2013-09-28 VITALS — BP 128/76 | HR 87 | Temp 98.5°F | Resp 16 | Ht 66.0 in | Wt 153.0 lb

## 2013-09-28 DIAGNOSIS — R413 Other amnesia: Secondary | ICD-10-CM

## 2013-09-28 DIAGNOSIS — I10 Essential (primary) hypertension: Secondary | ICD-10-CM

## 2013-09-28 DIAGNOSIS — I2510 Atherosclerotic heart disease of native coronary artery without angina pectoris: Secondary | ICD-10-CM

## 2013-09-28 DIAGNOSIS — IMO0002 Reserved for concepts with insufficient information to code with codable children: Secondary | ICD-10-CM | POA: Diagnosis not present

## 2013-09-28 DIAGNOSIS — R279 Unspecified lack of coordination: Secondary | ICD-10-CM

## 2013-09-28 DIAGNOSIS — S22080S Wedge compression fracture of T11-T12 vertebra, sequela: Secondary | ICD-10-CM

## 2013-09-28 DIAGNOSIS — H1045 Other chronic allergic conjunctivitis: Secondary | ICD-10-CM

## 2013-09-28 DIAGNOSIS — H1013 Acute atopic conjunctivitis, bilateral: Secondary | ICD-10-CM

## 2013-09-28 LAB — BASIC METABOLIC PANEL
BUN: 27 mg/dL — ABNORMAL HIGH (ref 6–23)
CALCIUM: 9 mg/dL (ref 8.4–10.5)
CHLORIDE: 97 meq/L (ref 96–112)
CO2: 28 mEq/L (ref 19–32)
CREATININE: 1.6 mg/dL — AB (ref 0.4–1.5)
GFR: 43.15 mL/min — ABNORMAL LOW (ref >60.00)
Glucose, Bld: 99 mg/dL (ref 70–99)
Potassium: 4.6 mEq/L (ref 3.5–5.1)
Sodium: 130 mEq/L — ABNORMAL LOW (ref 135–145)

## 2013-09-28 MED ORDER — BEPOTASTINE BESILATE 1.5 % OP SOLN: OPHTHALMIC | Status: DC

## 2013-09-28 NOTE — Assessment & Plan Note (Signed)
Doing well 

## 2013-09-28 NOTE — Assessment & Plan Note (Signed)
Doing well Continue with current prescription therapy as reflected on the Med list.  

## 2013-09-28 NOTE — Assessment & Plan Note (Addendum)
On Bepreve gtt -- R

## 2013-09-28 NOTE — Progress Notes (Signed)
   Subjective:       HPI   The patient presents for a follow-up of  hypertension, chronic dyslipidemia, CAD, dementia controlled with medicines. C/o eye allergies.  F/u on low Na   Wt Readings from Last 3 Encounters:  09/28/13 153 lb (69.4 kg)  07/19/13 153 lb (69.4 kg)  05/25/13 155 lb (70.308 kg)   BP Readings from Last 3 Encounters:  09/28/13 128/76  07/19/13 130/76  05/25/13 130/66      Review of Systems  Constitutional: Negative for appetite change, fatigue and unexpected weight change.  HENT: Negative for congestion, nosebleeds, sneezing and trouble swallowing.   Eyes: Negative for itching and visual disturbance.  Cardiovascular: Negative for palpitations and leg swelling.  Gastrointestinal: Negative for nausea, vomiting, diarrhea, constipation, blood in stool, abdominal distention and rectal pain.  Genitourinary: Negative for frequency and hematuria.  Musculoskeletal: Negative for gait problem, joint swelling and neck pain.  Neurological: Negative for dizziness, tremors and speech difficulty.  Psychiatric/Behavioral: Negative for sleep disturbance, dysphoric mood and agitation. The patient is not nervous/anxious.        Objective:   Physical Exam  Constitutional: He is oriented to person, place, and time. He appears well-developed and well-nourished.  HENT:  Mouth/Throat: Oropharynx is clear and moist.  Eyes: Conjunctivae are normal. Pupils are equal, round, and reactive to light.  Neck: Normal range of motion. No JVD present. No thyromegaly present.  Cardiovascular: Normal rate, regular rhythm, normal heart sounds and intact distal pulses.  Exam reveals no gallop and no friction rub.   No murmur heard. Pulmonary/Chest: Effort normal and breath sounds normal. No respiratory distress. He has no wheezes. He has no rales. He exhibits no tenderness.  Abdominal: Soft. Bowel sounds are normal. He exhibits no distension and no mass. There is no tenderness. There is  no rebound and no guarding.  Musculoskeletal: Normal range of motion. He exhibits tenderness (T12 is tender). He exhibits no edema.  Lymphadenopathy:    He has no cervical adenopathy.  Neurological: He is alert and oriented to person, place, and time. He has normal reflexes. No cranial nerve deficit. He exhibits normal muscle tone. Coordination normal.  Skin: Skin is warm and dry. No rash noted.  Psychiatric: He has a normal mood and affect. His behavior is normal. Judgment and thought content normal.   Lab Results  Component Value Date   WBC 9.9 02/14/2012   HGB 13.4 02/14/2012   HCT 40.5 02/14/2012   PLT 172.0 02/14/2012   GLUCOSE 81 05/25/2013   CHOL 110 05/25/2013   TRIG 67.0 05/25/2013   HDL 40.30 05/25/2013   LDLCALC 56 05/25/2013   ALT 34 05/25/2013   AST 31 05/25/2013   NA 132* 05/25/2013   K 4.8 05/25/2013   CL 101 05/25/2013   CREATININE 1.7* 05/25/2013   BUN 29* 05/25/2013   CO2 26 05/25/2013   TSH 4.012 12/30/2011   PSA 3.42 11/28/2011   INR 1.1 ratio* 08/17/2009          Assessment & Plan:

## 2013-09-28 NOTE — Assessment & Plan Note (Signed)
Continue with current prescription therapy as reflected on the Med list.  

## 2013-09-29 ENCOUNTER — Telehealth: Payer: Self-pay | Admitting: Internal Medicine

## 2013-09-29 NOTE — Telephone Encounter (Signed)
Relevant patient education assigned to patient using Emmi. ° °

## 2013-11-11 ENCOUNTER — Encounter: Payer: Medicare Other | Admitting: *Deleted

## 2013-11-11 ENCOUNTER — Telehealth: Payer: Self-pay | Admitting: Cardiology

## 2013-11-11 ENCOUNTER — Telehealth: Payer: Self-pay | Admitting: Internal Medicine

## 2013-11-11 NOTE — Telephone Encounter (Signed)
Spoke with pt and reminded pt of remote transmission that is due today. Pt verbalized understanding.   

## 2013-11-11 NOTE — Telephone Encounter (Signed)
Gave pt tech support number for pt to call and receive help with trouble shooting monitor.

## 2013-11-11 NOTE — Telephone Encounter (Signed)
New Message  Pt called requests a call back. He says he is having a hard time sending a signal.. Please assist

## 2013-11-12 ENCOUNTER — Encounter: Payer: Self-pay | Admitting: Cardiology

## 2013-11-17 ENCOUNTER — Encounter: Payer: Self-pay | Admitting: Internal Medicine

## 2013-11-17 ENCOUNTER — Ambulatory Visit (INDEPENDENT_AMBULATORY_CARE_PROVIDER_SITE_OTHER): Payer: Medicare Other | Admitting: *Deleted

## 2013-11-17 DIAGNOSIS — I4729 Other ventricular tachycardia: Secondary | ICD-10-CM | POA: Diagnosis not present

## 2013-11-17 DIAGNOSIS — I428 Other cardiomyopathies: Secondary | ICD-10-CM | POA: Diagnosis not present

## 2013-11-17 DIAGNOSIS — I472 Ventricular tachycardia: Secondary | ICD-10-CM | POA: Diagnosis not present

## 2013-11-17 LAB — MDC_IDC_ENUM_SESS_TYPE_REMOTE
Battery Voltage: 2.92 V
Brady Statistic AS VS Percent: 1.01 %
Date Time Interrogation Session: 20150826140705
HighPow Impedance: 49 Ohm
HighPow Impedance: 66 Ohm
Lead Channel Sensing Intrinsic Amplitude: 10.625 mV
Lead Channel Sensing Intrinsic Amplitude: 10.625 mV
Lead Channel Setting Pacing Amplitude: 2 V
Lead Channel Setting Pacing Pulse Width: 0.4 ms
Lead Channel Setting Sensing Sensitivity: 0.45 mV
MDC IDC MSMT LEADCHNL RA IMPEDANCE VALUE: 532 Ohm
MDC IDC MSMT LEADCHNL RA SENSING INTR AMPL: 2.25 mV
MDC IDC MSMT LEADCHNL RA SENSING INTR AMPL: 2.25 mV
MDC IDC MSMT LEADCHNL RV IMPEDANCE VALUE: 437 Ohm
MDC IDC SET LEADCHNL RV PACING AMPLITUDE: 2.5 V
MDC IDC SET ZONE DETECTION INTERVAL: 300 ms
MDC IDC SET ZONE DETECTION INTERVAL: 350 ms
MDC IDC STAT BRADY AP VP PERCENT: 0.01 %
MDC IDC STAT BRADY AP VS PERCENT: 98.98 %
MDC IDC STAT BRADY AS VP PERCENT: 0 %
MDC IDC STAT BRADY RA PERCENT PACED: 98.99 %
MDC IDC STAT BRADY RV PERCENT PACED: 0.01 %
Zone Setting Detection Interval: 380 ms
Zone Setting Detection Interval: 410 ms

## 2013-11-18 NOTE — Progress Notes (Signed)
Remote ICD transmission.   

## 2013-12-02 ENCOUNTER — Encounter: Payer: Self-pay | Admitting: Cardiology

## 2013-12-07 ENCOUNTER — Encounter: Payer: Self-pay | Admitting: Cardiology

## 2013-12-27 DIAGNOSIS — Z23 Encounter for immunization: Secondary | ICD-10-CM | POA: Diagnosis not present

## 2013-12-28 ENCOUNTER — Other Ambulatory Visit: Payer: Self-pay | Admitting: Internal Medicine

## 2014-02-01 ENCOUNTER — Ambulatory Visit (INDEPENDENT_AMBULATORY_CARE_PROVIDER_SITE_OTHER): Payer: Medicare Other | Admitting: Internal Medicine

## 2014-02-01 ENCOUNTER — Other Ambulatory Visit (INDEPENDENT_AMBULATORY_CARE_PROVIDER_SITE_OTHER): Payer: Medicare Other

## 2014-02-01 ENCOUNTER — Encounter: Payer: Self-pay | Admitting: Internal Medicine

## 2014-02-01 VITALS — BP 150/90 | HR 85 | Temp 98.3°F | Wt 153.0 lb

## 2014-02-01 DIAGNOSIS — I5022 Chronic systolic (congestive) heart failure: Secondary | ICD-10-CM

## 2014-02-01 DIAGNOSIS — I2510 Atherosclerotic heart disease of native coronary artery without angina pectoris: Secondary | ICD-10-CM

## 2014-02-01 DIAGNOSIS — I10 Essential (primary) hypertension: Secondary | ICD-10-CM

## 2014-02-01 DIAGNOSIS — M546 Pain in thoracic spine: Secondary | ICD-10-CM | POA: Diagnosis not present

## 2014-02-01 DIAGNOSIS — K624 Stenosis of anus and rectum: Secondary | ICD-10-CM | POA: Diagnosis not present

## 2014-02-01 LAB — BASIC METABOLIC PANEL
BUN: 27 mg/dL — AB (ref 6–23)
CHLORIDE: 101 meq/L (ref 96–112)
CO2: 22 mEq/L (ref 19–32)
Calcium: 8.8 mg/dL (ref 8.4–10.5)
Creatinine, Ser: 1.5 mg/dL (ref 0.4–1.5)
GFR: 45.73 mL/min — ABNORMAL LOW (ref >60.00)
Glucose, Bld: 90 mg/dL (ref 70–99)
Potassium: 4.7 mEq/L (ref 3.5–5.1)
SODIUM: 134 meq/L — AB (ref 135–145)

## 2014-02-01 MED ORDER — DILTIAZEM GEL 2 %: 1.0000 "application " | Freq: Three times a day (TID) | CUTANEOUS | Status: DC

## 2014-02-01 MED ORDER — IPRATROPIUM-ALBUTEROL 0.5-2.5 (3) MG/3ML IN SOLN: 3.0000 mL | Freq: Four times a day (QID) | RESPIRATORY_TRACT | Status: DC | PRN

## 2014-02-01 NOTE — Assessment & Plan Note (Signed)
Continue with current prescription therapy as reflected on the Med list.  

## 2014-02-01 NOTE — Assessment & Plan Note (Signed)
resolved 

## 2014-02-01 NOTE — Assessment & Plan Note (Signed)
Continue with current prescription therapy as reflected on the Med list. BPis ok at home SBP 130

## 2014-02-01 NOTE — Progress Notes (Signed)
Patient ID: [name] [name], male   DOB: 1922-08-20, 80 y.o.   MRN: [id number]   Subjective:       HPI   The patient presents for a follow-up of  hypertension, chronic dyslipidemia, CAD, dementia controlled with medicines. C/o eye allergies.  F/u on low Na   Wt Readings from Last 3 Encounters:  02/01/14 153 lb (69.4 kg)  09/28/13 153 lb (69.4 kg)  07/19/13 153 lb (69.4 kg)   BP Readings from Last 3 Encounters:  02/01/14 150/90  09/28/13 128/76  07/19/13 130/76      Review of Systems  Constitutional: Negative for appetite change, fatigue and unexpected weight change.  HENT: Negative for congestion, nosebleeds, sneezing and trouble swallowing.   Eyes: Negative for itching and visual disturbance.  Cardiovascular: Negative for palpitations and leg swelling.  Gastrointestinal: Negative for nausea, vomiting, diarrhea, constipation, blood in stool, abdominal distention and rectal pain.  Genitourinary: Negative for frequency and hematuria.  Musculoskeletal: Negative for joint swelling, gait problem and neck pain.  Neurological: Negative for dizziness, tremors and speech difficulty.  Psychiatric/Behavioral: Negative for sleep disturbance, dysphoric mood and agitation. The patient is not nervous/anxious.      Subjective:       HPI   The patient presents for a follow-up of  hypertension, chronic dyslipidemia, CAD, dementia controlled with medicines. C/o eye allergies.  F/u on low Na   Wt Readings from Last 3 Encounters:  02/01/14 153 lb (69.4 kg)  09/28/13 153 lb (69.4 kg)  07/19/13 153 lb (69.4 kg)   BP Readings from Last 3 Encounters:  02/01/14 150/90  09/28/13 128/76  07/19/13 130/76      Review of Systems  Constitutional: Negative for appetite change, fatigue and unexpected weight change.  HENT: Negative for congestion, nosebleeds, sneezing and trouble swallowing.   Eyes: Negative for itching and visual disturbance.  Cardiovascular: Negative for  palpitations and leg swelling.  Gastrointestinal: Negative for nausea, vomiting, diarrhea, constipation, blood in stool, abdominal distention and rectal pain.  Genitourinary: Negative for frequency and hematuria.  Musculoskeletal: Negative for gait problem, joint swelling and neck pain.  Neurological: Negative for dizziness, tremors and speech difficulty.  Psychiatric/Behavioral: Negative for sleep disturbance, dysphoric mood and agitation. The patient is not nervous/anxious.        Objective:     Lab Results  Component Value Date   WBC 9.9 02/14/2012   HGB 13.4 02/14/2012   HCT 40.5 02/14/2012   PLT 172.0 02/14/2012   GLUCOSE 99 09/28/2013   CHOL 110 05/25/2013   TRIG 67.0 05/25/2013   HDL 40.30 05/25/2013   LDLCALC 56 05/25/2013   ALT 34 05/25/2013   AST 31 05/25/2013   NA 130* 09/28/2013   K 4.6 09/28/2013   CL 97 09/28/2013   CREATININE 1.6* 09/28/2013   BUN 27* 09/28/2013   CO2 28 09/28/2013   TSH 4.012 12/30/2011   PSA 3.42 11/28/2011   INR 1.1 ratio* 08/17/2009          Assessment & Plan:       Objective:   Physical Exam  Constitutional: He is oriented to person, place, and time. He appears well-developed. No distress.  NAD  HENT:  Mouth/Throat: Oropharynx is clear and moist.  Eyes: Conjunctivae are normal. Pupils are equal, round, and reactive to light.  Neck: Normal range of motion. No JVD present. No thyromegaly present.  Cardiovascular: Normal rate, regular rhythm, normal heart sounds and intact distal pulses.  Exam reveals no gallop and no friction  rub.   No murmur heard. Pulmonary/Chest: Effort normal and breath sounds normal. No respiratory distress. He has no wheezes. He has no rales. He exhibits no tenderness.  Abdominal: Soft. Bowel sounds are normal. He exhibits no distension and no mass. There is no tenderness. There is no rebound and no guarding.  Musculoskeletal: Normal range of motion. He exhibits no edema or tenderness.  Lymphadenopathy:     He has no cervical adenopathy.  Neurological: He is alert and oriented to person, place, and time. He has normal reflexes. No cranial nerve deficit. He exhibits normal muscle tone. He displays a negative Romberg sign. Coordination and gait normal.  Skin: Skin is warm and dry. No rash noted.  Psychiatric: He has a normal mood and affect. His behavior is normal. Judgment and thought content normal.   Lab Results  Component Value Date   WBC 9.9 02/14/2012   HGB 13.4 02/14/2012   HCT 40.5 02/14/2012   PLT 172.0 02/14/2012   GLUCOSE 99 09/28/2013   CHOL 110 05/25/2013   TRIG 67.0 05/25/2013   HDL 40.30 05/25/2013   LDLCALC 56 05/25/2013   ALT 34 05/25/2013   AST 31 05/25/2013   NA 130* 09/28/2013   K 4.6 09/28/2013   CL 97 09/28/2013   CREATININE 1.6* 09/28/2013   BUN 27* 09/28/2013   CO2 28 09/28/2013   TSH 4.012 12/30/2011   PSA 3.42 11/28/2011   INR 1.1 ratio* 08/17/2009          Assessment & Plan:

## 2014-02-01 NOTE — Progress Notes (Signed)
Pre visit review using our clinic review tool, if applicable. No additional management support is needed unless otherwise documented below in the visit note. 

## 2014-02-04 ENCOUNTER — Other Ambulatory Visit: Payer: Self-pay

## 2014-02-04 ENCOUNTER — Other Ambulatory Visit: Payer: Self-pay | Admitting: Internal Medicine

## 2014-02-04 MED ORDER — ALBUTEROL SULFATE (2.5 MG/3ML) 0.083% IN NEBU: INHALATION_SOLUTION | RESPIRATORY_TRACT | Status: DC

## 2014-02-04 NOTE — Telephone Encounter (Signed)
Patient called in regards to this refill of albuterol.  Patient is completely out.  Patient is requesting a call in regards.

## 2014-02-04 NOTE — Telephone Encounter (Signed)
erx done

## 2014-02-15 ENCOUNTER — Ambulatory Visit (INDEPENDENT_AMBULATORY_CARE_PROVIDER_SITE_OTHER): Payer: Medicare Other | Admitting: Internal Medicine

## 2014-02-15 ENCOUNTER — Encounter: Payer: Self-pay | Admitting: Internal Medicine

## 2014-02-15 VITALS — BP 110/64 | HR 77 | Ht 68.0 in | Wt 154.6 lb

## 2014-02-15 DIAGNOSIS — I10 Essential (primary) hypertension: Secondary | ICD-10-CM | POA: Diagnosis not present

## 2014-02-15 DIAGNOSIS — I495 Sick sinus syndrome: Secondary | ICD-10-CM | POA: Diagnosis not present

## 2014-02-15 DIAGNOSIS — I2510 Atherosclerotic heart disease of native coronary artery without angina pectoris: Secondary | ICD-10-CM | POA: Diagnosis not present

## 2014-02-15 DIAGNOSIS — I5022 Chronic systolic (congestive) heart failure: Secondary | ICD-10-CM | POA: Diagnosis not present

## 2014-02-15 DIAGNOSIS — Z95810 Presence of automatic (implantable) cardiac defibrillator: Secondary | ICD-10-CM | POA: Diagnosis not present

## 2014-02-15 MED ORDER — NITROGLYCERIN 0.4 MG SL SUBL: 0.4000 mg | SUBLINGUAL_TABLET | SUBLINGUAL | Status: DC | PRN

## 2014-02-15 NOTE — Assessment & Plan Note (Signed)
His Medtronic DDD ICD is working normally. Will recheck in several months.

## 2014-02-15 NOTE — Assessment & Plan Note (Signed)
His symptoms are class 2A. He will continue his current meds.  

## 2014-02-15 NOTE — Patient Instructions (Signed)
Your physician wants you to follow-up in: 12 months with Dr. Knox Saliva will receive a reminder letter in the mail two months in advance. If you don't receive a letter, please call our office to schedule the follow-up appointment.  Remote monitoring is used to monitor your Pacemaker or ICD from home. This monitoring reduces the number of office visits required to check your device to one time per year. It allows [place] to keep an eye on the functioning of your device to ensure it is working properly. You are scheduled for a device check from home on 05/17/14. You may send your transmission at any time that day. If you have a wireless device, the transmission will be sent automatically. After your physician reviews your transmission, you will receive a postcard with your next transmission date.

## 2014-02-15 NOTE — Progress Notes (Signed)
HPI [name] [name] returns today for followup. He is a pleasant elderly man with an ICM, chronic systolic CHF, s/p ICD implant. In the interim he has been stable. He has class 2 symptoms. No syncope. No edema.  He notes that he occasionally uses his nebulizer to help with his wheezing. He walks regularly. No chest pain. No Known Allergies   Current Outpatient Prescriptions  Medication Sig Dispense Refill  . albuterol (PROVENTIL) (2.5 MG/3ML) 0.083% nebulizer solution USE ONE VIAL VIA NEBULIZER FOUR TIMES DAILY AS NEEDED. (Patient taking differently: USE ONE VIAL VIA NEBULIZER FOUR TIMES DAILY AS NEEDED. For shortness of breath or wheezing.) 375 mL 5  . aspirin 81 MG EC tablet Take 81 mg by mouth daily.      Marland Kitchen atorvastatin (LIPITOR) 20 MG tablet TAKE 1/2 TABLET  TWICE DAILY (Patient taking differently: TAKE 1/2 TABLET BY MOUTH TWICE DAILY) 90 tablet 2  . Bepotastine Besilate 1.5 % SOLN 1 gtt in R eye qd (Patient taking differently: Place 1 drop into the right eye daily. ) 5 mL 4  . Cholecalciferol (VITAMIN D3) 1000 UNITS tablet Take 1,000 Units by mouth daily.      Marland Kitchen diltiazem 2 % GEL Apply 1 application topically 3 (three) times daily. 30 g 3  . donepezil (ARICEPT) 10 MG tablet TAKE 1 TABLET AT BEDTIME (Patient taking differently: TAKE 1 TABLET BY MOUTH  AT BEDTIME) 90 tablet 2  . fluticasone (FLONASE) 50 MCG/ACT nasal spray Place 2 sprays into both nostrils daily. 48 g 3  . folic acid (FOLVITE) 485 MCG tablet Take 800 mcg by mouth daily.     Marland Kitchen levothyroxine (SYNTHROID, LEVOTHROID) 100 MCG tablet Take 1 tablet (100 mcg total) by mouth daily. 90 tablet 3  . metoprolol succinate (TOPROL-XL) 25 MG 24 hr tablet Take 0.5 tablets (12.5 mg total) by mouth 2 (two) times daily. Take with or immediately following a meal. 90 tablet 3  . Misc Natural Products (OSTEO BI-FLEX ADV DOUBLE ST) TABS Take 1 tablet by mouth at bedtime.     . nitroGLYCERIN (NITROSTAT) 0.4 MG SL tablet Place 1 tablet (0.4 mg total) under  the tongue every 5 (five) minutes as needed. Chest pain 30 tablet 2  . triamcinolone (NASACORT) 55 MCG/ACT nasal inhaler Place 1 spray into the nose daily. 3 Inhaler 1  . triamcinolone cream (KENALOG) 0.5 % Apply 1 application topically 3 (three) times daily. For dry skin, itching 60 g 3   No current facility-administered medications for this visit.     Past Medical History  Diagnosis Date  . Hypertension   . Hypothyroid   . CAD (coronary artery disease)     Dr. Lovena Le  . Osteoarthritis   . BPH (benign prostatic hypertrophy)     Dr. Terance Hart  . Osteoporosis     (took actonel x 5y)  . Renal insufficiency     Dr. Armstead Peaks  . Hip fx     x 2 on R.  . Sternum fx 2011  . Urinary incontinence   . Hearing loss     ROS:   All systems reviewed and negative except as noted in the HPI.   Past Surgical History  Procedure Laterality Date  . Heart disease      permanent pacemaker  . Appendectomy    . Inguinal hernia repair    . Hip fracture surgery  2011    ORIF  R.  . Pacemaker insertion    . Colonoscopy  12/1998, 02/2004  diverticulosois, external hemorrhoids     Family History  Problem Relation Age of Onset  . Coronary artery disease      male 1st degree relative<60  . Hypertension    . Cancer Sister      History   Social History  . Marital Status: Married    Spouse Name: N/A    Number of Children: 2  . Years of Education: N/A   Occupational History  . retired    Social History Main Topics  . Smoking status: Former Smoker -- 1.00 packs/day for 30 years    Quit date: 12/29/1950  . Smokeless tobacco: Never Used  . Alcohol Use: No  . Drug Use: No  . Sexual Activity: Not Currently   Other Topics Concern  . Not on file   Social History Narrative   Regular exercise--no.     BP 110/64 mmHg  Pulse 77  Ht 5\' 8"  (1.727 m)  Wt 154 lb 9.6 oz (70.126 kg)  BMI 23.51 kg/m2  Physical Exam:  Well appearing elderly man, NAD HEENT:  Unremarkable Neck:   6 cm JVD, no thyromegally Lungs:  Clear with no wheezes, rales, or rhonchi HEART:  Regular rate rhythm, no murmurs, no rubs, no clicks Abd:  soft, positive bowel sounds, no organomegally, no rebound, no guarding Ext:  2 plus pulses, no edema, no cyanosis, no clubbing Skin:  No rashes no nodules Neuro:  CN II through XII intact, motor grossly intact   DEVICE  Normal device function.  See PaceArt for details.   Assess/Plan:

## 2014-02-15 NOTE — Assessment & Plan Note (Signed)
His blood pressure is well controlled. He will continue his current medical therapy. He will maintain a low-sodium diet. He will remain active.

## 2014-02-16 LAB — MDC_IDC_ENUM_SESS_TYPE_INCLINIC
Battery Voltage: 2.89 V
Brady Statistic AP VS Percent: 99.05 %
Brady Statistic AS VS Percent: 0.94 %
Brady Statistic RA Percent Paced: 99.06 %
Brady Statistic RV Percent Paced: 0.01 %
HIGH POWER IMPEDANCE MEASURED VALUE: 44 Ohm
HighPow Impedance: 65 Ohm
Lead Channel Impedance Value: 494 Ohm
Lead Channel Sensing Intrinsic Amplitude: 1.875 mV
Lead Channel Sensing Intrinsic Amplitude: 11.875 mV
Lead Channel Setting Pacing Amplitude: 2 V
Lead Channel Setting Pacing Amplitude: 2.5 V
Lead Channel Setting Pacing Pulse Width: 0.4 ms
Lead Channel Setting Sensing Sensitivity: 0.45 mV
MDC IDC MSMT LEADCHNL RA SENSING INTR AMPL: 2.125 mV
MDC IDC MSMT LEADCHNL RV IMPEDANCE VALUE: 437 Ohm
MDC IDC MSMT LEADCHNL RV SENSING INTR AMPL: 10.5 mV
MDC IDC SESS DTM: 20151124210423
MDC IDC SET ZONE DETECTION INTERVAL: 380 ms
MDC IDC STAT BRADY AP VP PERCENT: 0.01 %
MDC IDC STAT BRADY AS VP PERCENT: 0 %
Zone Setting Detection Interval: 300 ms
Zone Setting Detection Interval: 350 ms
Zone Setting Detection Interval: 410 ms

## 2014-02-23 DIAGNOSIS — H26493 Other secondary cataract, bilateral: Secondary | ICD-10-CM | POA: Diagnosis not present

## 2014-03-24 ENCOUNTER — Ambulatory Visit (INDEPENDENT_AMBULATORY_CARE_PROVIDER_SITE_OTHER): Payer: Medicare Other | Admitting: Internal Medicine

## 2014-03-24 ENCOUNTER — Encounter: Payer: Self-pay | Admitting: Internal Medicine

## 2014-03-24 VITALS — BP 150/80 | HR 87 | Temp 98.2°F | Ht 66.0 in | Wt 152.2 lb

## 2014-03-24 DIAGNOSIS — J449 Chronic obstructive pulmonary disease, unspecified: Secondary | ICD-10-CM | POA: Diagnosis not present

## 2014-03-24 DIAGNOSIS — IMO0001 Reserved for inherently not codable concepts without codable children: Secondary | ICD-10-CM

## 2014-03-24 DIAGNOSIS — J209 Acute bronchitis, unspecified: Secondary | ICD-10-CM | POA: Diagnosis not present

## 2014-03-24 DIAGNOSIS — I2510 Atherosclerotic heart disease of native coronary artery without angina pectoris: Secondary | ICD-10-CM

## 2014-03-24 MED ORDER — AZITHROMYCIN 250 MG PO TABS: ORAL_TABLET | ORAL | Status: DC

## 2014-03-24 MED ORDER — HYDROCODONE-HOMATROPINE 5-1.5 MG/5ML PO SYRP: 5.0000 mL | ORAL_SOLUTION | Freq: Four times a day (QID) | ORAL | Status: DC | PRN

## 2014-03-24 NOTE — Progress Notes (Signed)
Pre visit review using our clinic review tool, if applicable. No additional management support is needed unless otherwise documented below in the visit note. 

## 2014-03-24 NOTE — Progress Notes (Signed)
   Subjective:    Patient ID: [name] [name], male    DOB: 1922-12-07, 80 y.o.   MRN: [id number]  HPI  He describes respiratory symptoms for approximately 10 days. Initially he had a sore throat and some postnasal drainage. He's developed a paroxysmal cough which lasts up to 10 seconds. The sputum produced is clear.  He's had associated itchy, watery eyes. He also had chills.  The cough has been associated with shortness of breath  He does have a history of COPD and chronic bronchitis. He uses his nebulizer on a bid schedule.  He has not smoked for over 3 decades. He did smoke 2 packs per day.    Review of Systems He's had no associated fever or sweats  He denies frontal headache, facial pain, nasal purulence, otic pain, otic discharge  The cough is not associated with wheezing.     Objective:   Physical Exam  Pertinent or positive findings include: He is slightly hoarse. Chest is barrel-shaped Pacer is noted in the left upper chest.  General appearance:good health ;well nourished; no acute distress or increased work of breathing is present.  No  lymphadenopathy about the head, neck, or axilla noted.  Eyes: No conjunctival inflammation or lid edema is present. There is no scleral icterus. Ears:  External ear exam shows no significant lesions or deformities.  Otoscopic examination reveals clear canals, tympanic membranes are intact bilaterally without bulging, retraction, inflammation or discharge. Nose:  External nasal examination shows no deformity or inflammation. Nasal mucosa are pink and moist without lesions or exudates. No septal dislocation or deviation.No obstruction to airflow.  Oral exam: Dental hygiene is good; lips and gums are healthy appearing.There is no oropharyngeal erythema or exudate noted.  Neck:  No deformities, thyromegaly, masses, or tenderness noted.   Supple with full range of motion without pain. Heart:  Normal rate and regular rhythm. S1 and S2  normal without gallop, murmur, click, rub or other extra sounds.  Lungs:Chest clear to auscultation; no wheezes, rhonchi,rales ,or rubs present.No increased work of breathing.   Extremities:  No cyanosis, edema, or clubbing  noted  Skin: Warm & dry w/o jaundice or tenting.       Assessment & Plan:  #1 acute bronchitis w/o bronchospasm  #2 COPD Plan: See orders and recommendations

## 2014-03-24 NOTE — Patient Instructions (Signed)
Carry room temperature water and sip liberally after coughing. 

## 2014-04-28 ENCOUNTER — Other Ambulatory Visit: Payer: Self-pay | Admitting: Internal Medicine

## 2014-04-30 ENCOUNTER — Emergency Department (HOSPITAL_COMMUNITY): Payer: No Typology Code available for payment source

## 2014-04-30 ENCOUNTER — Encounter (HOSPITAL_COMMUNITY): Payer: Self-pay | Admitting: Cardiology

## 2014-04-30 ENCOUNTER — Emergency Department (HOSPITAL_COMMUNITY)
Admission: EM | Admit: 2014-04-30 | Discharge: 2014-04-30 | Disposition: A | Discharge status: Home / Self Care | Payer: No Typology Code available for payment source | Attending: Emergency Medicine | Admitting: Emergency Medicine

## 2014-04-30 DIAGNOSIS — Z950 Presence of cardiac pacemaker: Secondary | ICD-10-CM | POA: Diagnosis not present

## 2014-04-30 DIAGNOSIS — I10 Essential (primary) hypertension: Secondary | ICD-10-CM | POA: Insufficient documentation

## 2014-04-30 DIAGNOSIS — T148 Other injury of unspecified body region: Secondary | ICD-10-CM | POA: Insufficient documentation

## 2014-04-30 DIAGNOSIS — S3992XA Unspecified injury of lower back, initial encounter: Secondary | ICD-10-CM | POA: Diagnosis not present

## 2014-04-30 DIAGNOSIS — R079 Chest pain, unspecified: Secondary | ICD-10-CM | POA: Diagnosis not present

## 2014-04-30 DIAGNOSIS — Z7951 Long term (current) use of inhaled steroids: Secondary | ICD-10-CM | POA: Diagnosis not present

## 2014-04-30 DIAGNOSIS — S299XXA Unspecified injury of thorax, initial encounter: Secondary | ICD-10-CM | POA: Insufficient documentation

## 2014-04-30 DIAGNOSIS — Z8781 Personal history of (healed) traumatic fracture: Secondary | ICD-10-CM | POA: Diagnosis not present

## 2014-04-30 DIAGNOSIS — S0990XA Unspecified injury of head, initial encounter: Secondary | ICD-10-CM | POA: Diagnosis not present

## 2014-04-30 DIAGNOSIS — Y998 Other external cause status: Secondary | ICD-10-CM | POA: Diagnosis not present

## 2014-04-30 DIAGNOSIS — S3991XA Unspecified injury of abdomen, initial encounter: Secondary | ICD-10-CM | POA: Diagnosis not present

## 2014-04-30 DIAGNOSIS — H9190 Unspecified hearing loss, unspecified ear: Secondary | ICD-10-CM | POA: Insufficient documentation

## 2014-04-30 DIAGNOSIS — M1990 Unspecified osteoarthritis, unspecified site: Secondary | ICD-10-CM | POA: Diagnosis not present

## 2014-04-30 DIAGNOSIS — Z87891 Personal history of nicotine dependence: Secondary | ICD-10-CM | POA: Insufficient documentation

## 2014-04-30 DIAGNOSIS — Y9389 Activity, other specified: Secondary | ICD-10-CM | POA: Diagnosis not present

## 2014-04-30 DIAGNOSIS — Z87448 Personal history of other diseases of urinary system: Secondary | ICD-10-CM | POA: Diagnosis not present

## 2014-04-30 DIAGNOSIS — M5032 Other cervical disc degeneration, mid-cervical region: Secondary | ICD-10-CM | POA: Insufficient documentation

## 2014-04-30 DIAGNOSIS — I2510 Atherosclerotic heart disease of native coronary artery without angina pectoris: Secondary | ICD-10-CM | POA: Diagnosis not present

## 2014-04-30 DIAGNOSIS — S199XXA Unspecified injury of neck, initial encounter: Secondary | ICD-10-CM | POA: Diagnosis not present

## 2014-04-30 DIAGNOSIS — R109 Unspecified abdominal pain: Secondary | ICD-10-CM | POA: Diagnosis not present

## 2014-04-30 DIAGNOSIS — R0981 Nasal congestion: Secondary | ICD-10-CM | POA: Diagnosis not present

## 2014-04-30 DIAGNOSIS — Z7982 Long term (current) use of aspirin: Secondary | ICD-10-CM | POA: Diagnosis not present

## 2014-04-30 DIAGNOSIS — Z7952 Long term (current) use of systemic steroids: Secondary | ICD-10-CM | POA: Insufficient documentation

## 2014-04-30 DIAGNOSIS — R52 Pain, unspecified: Secondary | ICD-10-CM

## 2014-04-30 DIAGNOSIS — Z792 Long term (current) use of antibiotics: Secondary | ICD-10-CM | POA: Diagnosis not present

## 2014-04-30 DIAGNOSIS — E039 Hypothyroidism, unspecified: Secondary | ICD-10-CM | POA: Diagnosis not present

## 2014-04-30 DIAGNOSIS — Y92410 Unspecified street and highway as the place of occurrence of the external cause: Secondary | ICD-10-CM | POA: Diagnosis not present

## 2014-04-30 DIAGNOSIS — S20219A Contusion of unspecified front wall of thorax, initial encounter: Secondary | ICD-10-CM | POA: Diagnosis not present

## 2014-04-30 DIAGNOSIS — S60811A Abrasion of right wrist, initial encounter: Secondary | ICD-10-CM | POA: Diagnosis not present

## 2014-04-30 DIAGNOSIS — T07XXXA Unspecified multiple injuries, initial encounter: Secondary | ICD-10-CM

## 2014-04-30 LAB — BASIC METABOLIC PANEL
Anion gap: 5 (ref 5–15)
BUN: 26 mg/dL — ABNORMAL HIGH (ref 6–23)
CO2: 25 mmol/L (ref 19–32)
Calcium: 8.8 mg/dL (ref 8.4–10.5)
Chloride: 104 mmol/L (ref 96–112)
Creatinine, Ser: 1.56 mg/dL — ABNORMAL HIGH (ref 0.50–1.35)
GFR calc Af Amer: 44 mL/min — ABNORMAL LOW (ref >90)
GFR calc non Af Amer: 38 mL/min — ABNORMAL LOW (ref >90)
Glucose, Bld: 100 mg/dL — ABNORMAL HIGH (ref 70–99)
Potassium: 4.5 mmol/L (ref 3.5–5.1)
Sodium: 134 mmol/L — ABNORMAL LOW (ref 135–145)

## 2014-04-30 LAB — CBC WITH DIFFERENTIAL/PLATELET
BASOS ABS: 0.1 10*3/uL (ref 0.0–0.1)
Basophils Relative: 1 % (ref 0–1)
EOS PCT: 8 % — AB (ref 0–5)
Eosinophils Absolute: 0.9 10*3/uL — ABNORMAL HIGH (ref 0.0–0.7)
HEMATOCRIT: 38.8 % — AB (ref 39.0–52.0)
Hemoglobin: 13 g/dL (ref 13.0–17.0)
Lymphocytes Relative: 23 % (ref 12–46)
Lymphs Abs: 2.6 10*3/uL (ref 0.7–4.0)
MCH: 31.7 pg (ref 26.0–34.0)
MCHC: 33.5 g/dL (ref 30.0–36.0)
MCV: 94.6 fL (ref 78.0–100.0)
MONO ABS: 1.5 10*3/uL — AB (ref 0.1–1.0)
MONOS PCT: 13 % — AB (ref 3–12)
NEUTROS ABS: 6 10*3/uL (ref 1.7–7.7)
Neutrophils Relative %: 55 % (ref 43–77)
Platelets: 142 10*3/uL — ABNORMAL LOW (ref 150–400)
RBC: 4.1 MIL/uL — ABNORMAL LOW (ref 4.22–5.81)
RDW: 13.5 % (ref 11.5–15.5)
WBC: 11.1 10*3/uL — ABNORMAL HIGH (ref 4.0–10.5)

## 2014-04-30 LAB — TROPONIN I: Troponin I: <0.03 ng/mL (ref <0.031)

## 2014-04-30 MED ORDER — IOHEXOL 300 MG/ML  SOLN
80.0000 mL | Freq: Once | INTRAMUSCULAR | Status: AC | PRN
  Administered 2014-04-30: 80 mL via INTRAVENOUS
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
## 2014-04-30 NOTE — ED Notes (Signed)
Pt no distress.  Still rates chest pain a 2/10.

## 2014-04-30 NOTE — ED Notes (Signed)
Pt alert & oriented x4, stable gait. Patient  given discharge instructions, paperwork & prescription(s). Patient Parent verbalized understanding. Pt left department in wheelchair w/ no further questions.

## 2014-04-30 NOTE — Discharge Instructions (Signed)
Use ice on the sore areas 3 times a day, for 2 days, after that, use heat. Take Tylenol every 4 hours if needed for pain. See your doctor for a checkup, next week.   Contusion A contusion is a deep bruise. Contusions are the result of an injury that caused bleeding under the skin. The contusion may turn blue, purple, or yellow. Minor injuries will give you a painless contusion, but more severe contusions may stay painful and swollen for a few weeks.  CAUSES  A contusion is usually caused by a blow, trauma, or direct force to an area of the body. SYMPTOMS   Swelling and redness of the injured area.  Bruising of the injured area.  Tenderness and soreness of the injured area.  Pain. DIAGNOSIS  The diagnosis can be made by taking a history and physical exam. An X-ray, CT scan, or MRI may be needed to determine if there were any associated injuries, such as fractures. TREATMENT  Specific treatment will depend on what area of the body was injured. In general, the best treatment for a contusion is resting, icing, elevating, and applying cold compresses to the injured area. Over-the-counter medicines may also be recommended for pain control. Ask your caregiver what the best treatment is for your contusion. HOME CARE INSTRUCTIONS   Put ice on the injured area.  Put ice in a plastic bag.  Place a towel between your skin and the bag.  Leave the ice on for 15-20 minutes, 3-4 times a day, or as directed by your health care provider.  Only take over-the-counter or prescription medicines for pain, discomfort, or fever as directed by your caregiver. Your caregiver may recommend avoiding anti-inflammatory medicines (aspirin, ibuprofen, and naproxen) for 48 hours because these medicines may increase bruising.  Rest the injured area.  If possible, elevate the injured area to reduce swelling. SEEK IMMEDIATE MEDICAL CARE IF:   You have increased bruising or swelling.  You have pain that is  getting worse.  Your swelling or pain is not relieved with medicines. MAKE SURE YOU:   Understand these instructions.  Will watch your condition.  Will get help right away if you are not doing well or get worse. Document Released: 12/19/2004 Document Revised: 03/16/2013 Document Reviewed: 01/14/2011 [facility] Patient Information 2015 [place], [place]. This information is not intended to replace advice given to you by your health care provider. Make sure you discuss any questions you have with your health care provider.
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
## 2014-05-06 ENCOUNTER — Ambulatory Visit (INDEPENDENT_AMBULATORY_CARE_PROVIDER_SITE_OTHER): Payer: Medicare Other | Admitting: Internal Medicine

## 2014-05-06 ENCOUNTER — Encounter: Payer: Self-pay | Admitting: Internal Medicine

## 2014-05-06 VITALS — BP 150/84 | HR 82 | Wt 156.0 lb

## 2014-05-06 DIAGNOSIS — M5442 Lumbago with sciatica, left side: Secondary | ICD-10-CM

## 2014-05-06 DIAGNOSIS — M5441 Lumbago with sciatica, right side: Secondary | ICD-10-CM | POA: Diagnosis not present

## 2014-05-06 DIAGNOSIS — M546 Pain in thoracic spine: Secondary | ICD-10-CM
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
## 2014-05-06 NOTE — Progress Notes (Signed)
Pre visit review using our clinic review tool, if applicable. No additional management support is needed unless otherwise documented below in the visit note. 

## 2014-05-10 ENCOUNTER — Other Ambulatory Visit: Payer: Self-pay | Admitting: Internal Medicine

## 2014-05-17 ENCOUNTER — Telehealth: Payer: Self-pay | Admitting: Cardiology

## 2014-05-17 ENCOUNTER — Ambulatory Visit (INDEPENDENT_AMBULATORY_CARE_PROVIDER_SITE_OTHER): Payer: Medicare Other | Admitting: *Deleted

## 2014-05-17 DIAGNOSIS — I472 Ventricular tachycardia: Secondary | ICD-10-CM

## 2014-05-17 DIAGNOSIS — I5022 Chronic systolic (congestive) heart failure: Secondary | ICD-10-CM | POA: Diagnosis not present

## 2014-05-17 DIAGNOSIS — I4729 Other ventricular tachycardia: Secondary | ICD-10-CM

## 2014-05-17 LAB — MDC_IDC_ENUM_SESS_TYPE_REMOTE
Battery Voltage: 2.8 V
Brady Statistic AP VP Percent: 0 %
Brady Statistic AS VP Percent: 0 %
Brady Statistic AS VS Percent: 1.94 %
Date Time Interrogation Session: 20160223183423
HIGH POWER IMPEDANCE MEASURED VALUE: 62 Ohm
HighPow Impedance: 45 Ohm
Lead Channel Impedance Value: 437 Ohm
Lead Channel Impedance Value: 532 Ohm
Lead Channel Sensing Intrinsic Amplitude: 10.875 mV
Lead Channel Sensing Intrinsic Amplitude: 2.125 mV
Lead Channel Sensing Intrinsic Amplitude: 2.125 mV
Lead Channel Setting Pacing Amplitude: 2.5 V
Lead Channel Setting Pacing Pulse Width: 0.4 ms
MDC IDC MSMT LEADCHNL RV SENSING INTR AMPL: 10.875 mV
MDC IDC SET LEADCHNL RA PACING AMPLITUDE: 2 V
MDC IDC SET LEADCHNL RV SENSING SENSITIVITY: 0.45 mV
MDC IDC SET ZONE DETECTION INTERVAL: 410 ms
MDC IDC STAT BRADY AP VS PERCENT: 98.06 %
MDC IDC STAT BRADY RA PERCENT PACED: 98.06 %
MDC IDC STAT BRADY RV PERCENT PACED: 0 %
Zone Setting Detection Interval: 300 ms
Zone Setting Detection Interval: 350 ms
Zone Setting Detection Interval: 380 ms

## 2014-05-17 NOTE — Progress Notes (Signed)
Remote ICD transmission.   

## 2014-05-17 NOTE — Telephone Encounter (Signed)
Spoke with pt and reminded pt of remote transmission that is due today. Pt verbalized understanding.   

## 2014-05-26 ENCOUNTER — Encounter: Payer: Self-pay | Admitting: Internal Medicine

## 2014-05-26 ENCOUNTER — Ambulatory Visit (INDEPENDENT_AMBULATORY_CARE_PROVIDER_SITE_OTHER): Payer: Medicare Other | Admitting: Internal Medicine

## 2014-05-26 VITALS — BP 148/90 | HR 87 | Temp 97.5°F | Wt 153.0 lb

## 2014-05-26 DIAGNOSIS — M25561 Pain in right knee: Secondary | ICD-10-CM | POA: Diagnosis not present

## 2014-05-26 DIAGNOSIS — M546 Pain in thoracic spine: Secondary | ICD-10-CM | POA: Diagnosis not present

## 2014-05-26 MED ORDER — FLUTICASONE PROPIONATE 50 MCG/ACT NA SUSP: 2.0000 | Freq: Every day | NASAL | Status: AC

## 2014-05-26 MED ORDER — METOPROLOL SUCCINATE ER 25 MG PO TB24: 12.5000 mg | ORAL_TABLET | Freq: Two times a day (BID) | ORAL | Status: DC

## 2014-05-26 MED ORDER — LEVOTHYROXINE SODIUM 100 MCG PO TABS: 100.0000 ug | ORAL_TABLET | Freq: Every day | ORAL | Status: DC
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
## 2014-05-26 NOTE — Progress Notes (Signed)
Pre visit review using our clinic review tool, if applicable. No additional management support is needed unless otherwise documented below in the visit note. 
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
## 2014-05-27 ENCOUNTER — Encounter: Payer: Self-pay | Admitting: Cardiology

## 2014-06-01 ENCOUNTER — Encounter: Payer: Self-pay | Admitting: Internal Medicine

## 2014-06-02 ENCOUNTER — Encounter: Payer: Medicare Other | Admitting: Internal Medicine

## 2014-06-03 DIAGNOSIS — S22040A Wedge compression fracture of fourth thoracic vertebra, initial encounter for closed fracture: Secondary | ICD-10-CM | POA: Diagnosis not present

## 2014-06-03 DIAGNOSIS — M546 Pain in thoracic spine: Secondary | ICD-10-CM | POA: Diagnosis not present

## 2014-06-03 DIAGNOSIS — S22030A Wedge compression fracture of third thoracic vertebra, initial encounter for closed fracture: Secondary | ICD-10-CM | POA: Diagnosis not present

## 2014-06-06 ENCOUNTER — Other Ambulatory Visit: Payer: Self-pay | Admitting: Physician Assistant

## 2014-06-06 DIAGNOSIS — M546 Pain in thoracic spine: Secondary | ICD-10-CM

## 2014-06-08 ENCOUNTER — Ambulatory Visit
Admission: RE | Admit: 2014-06-08 | Discharge: 2014-06-08 | Disposition: A | Discharge status: Home / Self Care | Payer: PRIVATE HEALTH INSURANCE | Source: Ambulatory Visit | Attending: Physician Assistant | Admitting: Physician Assistant

## 2014-06-08 DIAGNOSIS — M546 Pain in thoracic spine: Secondary | ICD-10-CM

## 2014-06-08 DIAGNOSIS — S22040A Wedge compression fracture of fourth thoracic vertebra, initial encounter for closed fracture: Secondary | ICD-10-CM | POA: Diagnosis not present

## 2014-06-08 DIAGNOSIS — M5134 Other intervertebral disc degeneration, thoracic region: Secondary | ICD-10-CM | POA: Diagnosis not present

## 2014-06-16 ENCOUNTER — Encounter (HOSPITAL_COMMUNITY): Payer: Self-pay

## 2014-06-16 ENCOUNTER — Inpatient Hospital Stay (HOSPITAL_COMMUNITY)
Admission: EM | Admit: 2014-06-16 | Discharge: 2014-06-20 | DRG: 378 | Disposition: A | Discharge status: Home Health | Payer: Medicare Other | Attending: Internal Medicine | Admitting: Internal Medicine

## 2014-06-16 DIAGNOSIS — M810 Age-related osteoporosis without current pathological fracture: Secondary | ICD-10-CM | POA: Diagnosis present

## 2014-06-16 DIAGNOSIS — D696 Thrombocytopenia, unspecified: Secondary | ICD-10-CM | POA: Diagnosis not present

## 2014-06-16 DIAGNOSIS — I10 Essential (primary) hypertension: Secondary | ICD-10-CM | POA: Diagnosis present

## 2014-06-16 DIAGNOSIS — M1990 Unspecified osteoarthritis, unspecified site: Secondary | ICD-10-CM | POA: Diagnosis present

## 2014-06-16 DIAGNOSIS — D62 Acute posthemorrhagic anemia: Secondary | ICD-10-CM | POA: Diagnosis not present

## 2014-06-16 DIAGNOSIS — K7460 Unspecified cirrhosis of liver: Secondary | ICD-10-CM | POA: Diagnosis present

## 2014-06-16 DIAGNOSIS — E871 Hypo-osmolality and hyponatremia: Secondary | ICD-10-CM | POA: Diagnosis present

## 2014-06-16 DIAGNOSIS — I2510 Atherosclerotic heart disease of native coronary artery without angina pectoris: Secondary | ICD-10-CM | POA: Diagnosis present

## 2014-06-16 DIAGNOSIS — T39395A Adverse effect of other nonsteroidal anti-inflammatory drugs [NSAID], initial encounter: Secondary | ICD-10-CM | POA: Diagnosis present

## 2014-06-16 DIAGNOSIS — K921 Melena: Secondary | ICD-10-CM | POA: Diagnosis not present

## 2014-06-16 DIAGNOSIS — E875 Hyperkalemia: Secondary | ICD-10-CM | POA: Diagnosis present

## 2014-06-16 DIAGNOSIS — I959 Hypotension, unspecified: Secondary | ICD-10-CM

## 2014-06-16 DIAGNOSIS — H9190 Unspecified hearing loss, unspecified ear: Secondary | ICD-10-CM | POA: Diagnosis present

## 2014-06-16 DIAGNOSIS — M40204 Unspecified kyphosis, thoracic region: Secondary | ICD-10-CM | POA: Diagnosis present

## 2014-06-16 DIAGNOSIS — R031 Nonspecific low blood-pressure reading: Secondary | ICD-10-CM | POA: Diagnosis not present

## 2014-06-16 DIAGNOSIS — M549 Dorsalgia, unspecified: Secondary | ICD-10-CM | POA: Diagnosis present

## 2014-06-16 DIAGNOSIS — D631 Anemia in chronic kidney disease: Secondary | ICD-10-CM

## 2014-06-16 DIAGNOSIS — K2980 Duodenitis without bleeding: Secondary | ICD-10-CM | POA: Diagnosis present

## 2014-06-16 DIAGNOSIS — E039 Hypothyroidism, unspecified: Secondary | ICD-10-CM | POA: Diagnosis present

## 2014-06-16 DIAGNOSIS — T1490 Injury, unspecified: Secondary | ICD-10-CM | POA: Diagnosis not present

## 2014-06-16 DIAGNOSIS — Z8249 Family history of ischemic heart disease and other diseases of the circulatory system: Secondary | ICD-10-CM

## 2014-06-16 DIAGNOSIS — Z7982 Long term (current) use of aspirin: Secondary | ICD-10-CM

## 2014-06-16 DIAGNOSIS — N189 Chronic kidney disease, unspecified: Secondary | ICD-10-CM

## 2014-06-16 DIAGNOSIS — Z87891 Personal history of nicotine dependence: Secondary | ICD-10-CM

## 2014-06-16 DIAGNOSIS — K254 Chronic or unspecified gastric ulcer with hemorrhage: Principal | ICD-10-CM | POA: Diagnosis present

## 2014-06-16 DIAGNOSIS — D72829 Elevated white blood cell count, unspecified: Secondary | ICD-10-CM | POA: Diagnosis present

## 2014-06-16 NOTE — ED Provider Notes (Signed)
This chart was scribed for [place], DO by Delphia Grates, ED Scribe. This patient was seen in room APA06/APA06 and the patient's care was started at 11:55 PM.  TIME SEEN: 2355  CHIEF COMPLAINT: Black, tarry stools  HPI:  HPI Comments: [name] [name] is a 81 y.o. male, with history of HTN, hypothyroid, CAD, BPH,  who presents to the Emergency Department complaining of black stools since this morning at 0900. He also notes back pain but states this is chronic from a compression fracture. Reports he has been taking Anaprox recently for his back pain. He reports history of colon polyps and states he last colonoscopy was several years ago. He is unsure who his previous gastroenterologist was. Patient currently takes 81 mg aspirin and has not had a blood transfusion in the past. Patient denies nausea, vomiting, or dizziness. No chest pain or shortness of breath. Denies bowel or bladder incontinence, numbness, tingling or focal weakness.   ROS: See HPI Constitutional: no fever  Eyes: no drainage  ENT: no runny nose   Cardiovascular:  no chest pain  Resp: no SOB  GI: no vomiting GU: no dysuria Integumentary: no rash  Allergy: no hives  Musculoskeletal: no leg swelling  Neurological: no slurred speech ROS otherwise negative  PAST MEDICAL HISTORY/PAST SURGICAL HISTORY:  Past Medical History  Diagnosis Date  . Hypertension   . Hypothyroid   . CAD (coronary artery disease)     Dr. Lovena Le  . Osteoarthritis   . BPH (benign prostatic hypertrophy)     Dr. Terance Hart  . Osteoporosis     (took actonel x 5y)  . Renal insufficiency     Dr. Armstead Peaks  . Hip fx     x 2 on R.  . Sternum fx 2011  . Urinary incontinence   . Hearing loss     MEDICATIONS:  Prior to Admission medications   Medication Sig Start Date End Date Taking? Authorizing Provider  albuterol (PROVENTIL) (2.5 MG/3ML) 0.083% nebulizer solution USE ONE VIAL VIA NEBULIZER FOUR TIMES DAILY AS NEEDED. Patient taking  differently: Take 2.5 mg by nebulization 2 (two) times daily. USE ONE VIAL VIA NEBULIZER FOUR TIMES DAILY AS NEEDED. For shortness of breath or wheezing. 02/04/14   Aleksei Plotnikov V, MD  Ascorbic Acid (VITAMIN C PO) Take 1 tablet by mouth daily.    Historical Provider, MD  aspirin 81 MG EC tablet Take 81 mg by mouth daily.      Historical Provider, MD  atorvastatin (LIPITOR) 20 MG tablet TAKE 1/2 TABLET  TWICE DAILY Patient taking differently: TAKE 1/2 TABLET BY MOUTH TWICE DAILY 12/29/13   Aleksei Plotnikov V, MD  Bepotastine Besilate 1.5 % SOLN 1 gtt in R eye qd Patient taking differently: Place 1 drop into the right eye daily.  09/28/13   Aleksei Plotnikov V, MD  Cholecalciferol (VITAMIN D3) 1000 UNITS tablet Take 1,000 Units by mouth daily.      Historical Provider, MD  diltiazem 2 % GEL Apply 1 application topically 3 (three) times daily. Patient taking differently: Apply 1 application topically 3 (three) times daily as needed.  02/01/14   Aleksei Plotnikov V, MD  donepezil (ARICEPT) 10 MG tablet TAKE 1 TABLET AT BEDTIME 05/10/14   Aleksei Plotnikov V, MD  fluticasone (FLONASE) 50 MCG/ACT nasal spray Place 2 sprays into both nostrils daily. 05/26/14   Aleksei Plotnikov V, MD  folic acid (FOLVITE) 952 MCG tablet Take 800 mcg by mouth daily.     Historical Provider,  MD  hydroxypropyl methylcellulose / hypromellose (ISOPTO TEARS / GONIOVISC) 2.5 % ophthalmic solution Place 1 drop into the left eye daily.    Historical Provider, MD  levothyroxine (SYNTHROID, LEVOTHROID) 100 MCG tablet Take 1 tablet (100 mcg total) by mouth daily. 05/26/14   Aleksei Plotnikov V, MD  metoprolol succinate (TOPROL-XL) 25 MG 24 hr tablet Take 0.5 tablets (12.5 mg total) by mouth 2 (two) times daily. Take with or immediately following a meal. 07/20/13   Aleksei Plotnikov V, MD  metoprolol succinate (TOPROL-XL) 25 MG 24 hr tablet Take 0.5 tablets (12.5 mg total) by mouth 2 (two) times daily. 05/26/14   Aleksei Plotnikov V, MD   Misc Natural Products (OSTEO BI-FLEX ADV DOUBLE ST) TABS Take 1 tablet by mouth at bedtime.     Historical Provider, MD  nitroGLYCERIN (NITROSTAT) 0.4 MG SL tablet Place 1 tablet (0.4 mg total) under the tongue every 5 (five) minutes as needed. Chest pain 02/15/14 02/14/16  Evans Lance, MD  triamcinolone (NASACORT) 55 MCG/ACT nasal inhaler Place 1 spray into the nose daily. 05/01/12   Aleksei Plotnikov V, MD  triamcinolone cream (KENALOG) 0.5 % Apply 1 application topically 3 (three) times daily. For dry skin, itching Patient taking differently: Apply 1 application topically 3 (three) times daily as needed (itching, dry skin). For dry skin, itching 05/25/13   Cassandria Anger, MD    ALLERGIES:  No Known Allergies  SOCIAL HISTORY:  History  Substance Use Topics  . Smoking status: Former Smoker -- 1.00 packs/day for 30 years    Quit date: 12/29/1950  . Smokeless tobacco: Never Used  . Alcohol Use: No    FAMILY HISTORY: Family History  Problem Relation Age of Onset  . Coronary artery disease      male 1st degree relative<60  . Hypertension    . Cancer Sister     EXAM:  BP 95/58 mmHg  Pulse 69  Temp(Src) 97.5 F (36.4 C)  Resp 16  Ht 5\' 4"  (1.626 m)  Wt 153 lb (69.4 kg)  BMI 26.25 kg/m2  SpO2 75%  CONSTITUTIONAL: Alert and oriented and responds appropriately to questions. Thin, chronically ill-appearing, in no distress, hypotensive HEAD: Normocephalic EYES: Conjunctivae pallor, PERRL ENT: normal nose; no rhinorrhea; moist mucous membranes; pharynx without lesions noted NECK: Supple, no meningismus, no LAD  CARD: RRR; S1 and S2 appreciated; no murmurs, no clicks, no rubs, no gallops RESP: Normal chest excursion without splinting or tachypnea; breath sounds clear and equal bilaterally; no wheezes, no rhonchi, no rales, no hypoxia respiratory distress ABD/GI: Normal bowel sounds; non-distended; soft, non-tender, no rebound, no guarding Rectal: black/maroon colored  stool. 2 nonthrombosed external hemorrhoids. Normal rectal tone. BACK:  The back appears normal and is non-tender to palpation, there is no CVA tenderness EXT: Normal ROM in all joints; non-tender to palpation; no edema; normal capillary refill; no cyanosis    SKIN: Normal color for age and race; warm NEURO: Moves all extremities equally, sensation to light touch intact diffusely, cranial nerves II through XII intact PSYCH: The patient's mood and manner are appropriate. Grooming and personal hygiene are appropriate.  MEDICAL DECISION MAKING: Patient here a GI bleed with hypotension. We'll give IV fluids, protonic, obtain labs, type and screen. Abdominal exam is completely benign. Denies prior history of GI bleed. Likely secondary to recent NSAID use due to compression fracture in his back. No new back pain. No new neurologic deficits.  ED PROGRESS: Patient's blood pressure improving with IV fluids. Hemoglobin is  11.9. Platelets, coags normal. Discussed with Dr. Oneida Alar with gastroenterology who will see the patient in the morning for endoscopy. Will keep patient nothing by mouth, continue IV fluids.   Patient now reports he would like to be transferred to [place] with Dr. Alcario Drought with hospitalist service who will assist with transfer to [facility] stepdown. Updated Dr. Oneida Alar.  Patient's blood pressure improved and stable.    We have now been informed that there are no stepdown beds available at [facility]. Patient is now comfortable with staying here. Dr. Oneida Alar will be consulted again by hospitalist service in the morning.      EKG Interpretation  Date/Time:  Thursday June 16 2014 23:39:17 EDT Ventricular Rate:  75 PR Interval:  198 QRS Duration: 127 QT Interval:  419 QTC Calculation: 468 R Axis:   42 Text Interpretation:  Sinus rhythm Nonspecific intraventricular conduction delay Anterolateral infarct, age indeterminate No significant change since last  tracing Confirmed by WARD,  DO, KRISTEN ([id number]) on 06/17/2014 12:15:55 AM         CRITICAL CARE Performed by: Nyra Jabs   Total critical care time: 45 minutes  Critical care time was exclusive of separately billable procedures and treating other patients.  Critical care was necessary to treat or prevent imminent or life-threatening deterioration.  Critical care was time spent personally by me on the following activities: development of treatment plan with patient and/or surrogate as well as nursing, discussions with consultants, evaluation of patient's response to treatment, examination of patient, obtaining history from patient or surrogate, ordering and performing treatments and interventions, ordering and review of laboratory studies, ordering and review of radiographic studies, pulse oximetry and re-evaluation of patient's condition.    I personally performed the services described in this documentation, which was scribed in my presence. The recorded information has been reviewed and is accurate.    [place], DO 06/17/14 [phone]
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
## 2014-06-16 NOTE — ED Notes (Signed)
Patient on cardiac monitor at this time. Manual BP 90/60

## 2014-06-17 ENCOUNTER — Encounter (HOSPITAL_COMMUNITY)
Admission: EM | Disposition: A | Discharge status: Home Health | Payer: Self-pay | Source: Home / Self Care | Attending: Internal Medicine

## 2014-06-17 ENCOUNTER — Encounter (HOSPITAL_COMMUNITY): Payer: Self-pay

## 2014-06-17 DIAGNOSIS — Z8249 Family history of ischemic heart disease and other diseases of the circulatory system: Secondary | ICD-10-CM | POA: Diagnosis not present

## 2014-06-17 DIAGNOSIS — K2980 Duodenitis without bleeding: Secondary | ICD-10-CM | POA: Diagnosis present

## 2014-06-17 DIAGNOSIS — H9190 Unspecified hearing loss, unspecified ear: Secondary | ICD-10-CM | POA: Diagnosis present

## 2014-06-17 DIAGNOSIS — Z87891 Personal history of nicotine dependence: Secondary | ICD-10-CM | POA: Diagnosis not present

## 2014-06-17 DIAGNOSIS — M1990 Unspecified osteoarthritis, unspecified site: Secondary | ICD-10-CM | POA: Diagnosis present

## 2014-06-17 DIAGNOSIS — I10 Essential (primary) hypertension: Secondary | ICD-10-CM | POA: Diagnosis present

## 2014-06-17 DIAGNOSIS — E875 Hyperkalemia: Secondary | ICD-10-CM | POA: Diagnosis not present

## 2014-06-17 DIAGNOSIS — K2950 Unspecified chronic gastritis without bleeding: Secondary | ICD-10-CM | POA: Diagnosis not present

## 2014-06-17 DIAGNOSIS — D696 Thrombocytopenia, unspecified: Secondary | ICD-10-CM | POA: Diagnosis present

## 2014-06-17 DIAGNOSIS — K7460 Unspecified cirrhosis of liver: Secondary | ICD-10-CM | POA: Diagnosis not present

## 2014-06-17 DIAGNOSIS — T39395A Adverse effect of other nonsteroidal anti-inflammatory drugs [NSAID], initial encounter: Secondary | ICD-10-CM | POA: Diagnosis not present

## 2014-06-17 DIAGNOSIS — I2510 Atherosclerotic heart disease of native coronary artery without angina pectoris: Secondary | ICD-10-CM | POA: Diagnosis present

## 2014-06-17 DIAGNOSIS — D72829 Elevated white blood cell count, unspecified: Secondary | ICD-10-CM | POA: Diagnosis present

## 2014-06-17 DIAGNOSIS — E039 Hypothyroidism, unspecified: Secondary | ICD-10-CM | POA: Diagnosis present

## 2014-06-17 DIAGNOSIS — K254 Chronic or unspecified gastric ulcer with hemorrhage: Principal | ICD-10-CM

## 2014-06-17 DIAGNOSIS — K250 Acute gastric ulcer with hemorrhage: Secondary | ICD-10-CM | POA: Diagnosis not present

## 2014-06-17 DIAGNOSIS — I9589 Other hypotension: Secondary | ICD-10-CM

## 2014-06-17 DIAGNOSIS — D62 Acute posthemorrhagic anemia: Secondary | ICD-10-CM | POA: Diagnosis not present

## 2014-06-17 DIAGNOSIS — Z7982 Long term (current) use of aspirin: Secondary | ICD-10-CM | POA: Diagnosis not present

## 2014-06-17 DIAGNOSIS — D6489 Other specified anemias: Secondary | ICD-10-CM | POA: Diagnosis not present

## 2014-06-17 DIAGNOSIS — K921 Melena: Secondary | ICD-10-CM

## 2014-06-17 DIAGNOSIS — K259 Gastric ulcer, unspecified as acute or chronic, without hemorrhage or perforation: Secondary | ICD-10-CM

## 2014-06-17 DIAGNOSIS — E871 Hypo-osmolality and hyponatremia: Secondary | ICD-10-CM | POA: Diagnosis not present

## 2014-06-17 DIAGNOSIS — M810 Age-related osteoporosis without current pathological fracture: Secondary | ICD-10-CM | POA: Diagnosis present

## 2014-06-17 DIAGNOSIS — M40204 Unspecified kyphosis, thoracic region: Secondary | ICD-10-CM | POA: Diagnosis present

## 2014-06-17 DIAGNOSIS — I951 Orthostatic hypotension: Secondary | ICD-10-CM | POA: Diagnosis not present

## 2014-06-17 DIAGNOSIS — M549 Dorsalgia, unspecified: Secondary | ICD-10-CM | POA: Diagnosis present

## 2014-06-17 HISTORY — PX: ESOPHAGOGASTRODUODENOSCOPY: SHX1529

## 2014-06-17 LAB — BASIC METABOLIC PANEL
Anion gap: 6 (ref 5–15)
BUN: 63 mg/dL — ABNORMAL HIGH (ref 6–23)
CO2: 21 mmol/L (ref 19–32)
Calcium: 7.8 mg/dL — ABNORMAL LOW (ref 8.4–10.5)
Chloride: 101 mmol/L (ref 96–112)
Creatinine, Ser: 1.72 mg/dL — ABNORMAL HIGH (ref 0.50–1.35)
GFR calc non Af Amer: 33 mL/min — ABNORMAL LOW (ref >90)
GFR, EST AFRICAN AMERICAN: 39 mL/min — AB (ref >90)
Glucose, Bld: 95 mg/dL (ref 70–99)
POTASSIUM: 5.2 mmol/L — AB (ref 3.5–5.1)
Sodium: 128 mmol/L — ABNORMAL LOW (ref 135–145)

## 2014-06-17 LAB — COMPREHENSIVE METABOLIC PANEL
ALBUMIN: 4 g/dL (ref 3.5–5.2)
ALT: 28 U/L (ref 0–53)
ANION GAP: 11 (ref 5–15)
AST: 33 U/L (ref 0–37)
Alkaline Phosphatase: 155 U/L — ABNORMAL HIGH (ref 39–117)
BUN: 62 mg/dL — ABNORMAL HIGH (ref 6–23)
CO2: 22 mmol/L (ref 19–32)
Calcium: 9.1 mg/dL (ref 8.4–10.5)
Chloride: 92 mmol/L — ABNORMAL LOW (ref 96–112)
Creatinine, Ser: 1.86 mg/dL — ABNORMAL HIGH (ref 0.50–1.35)
GFR calc Af Amer: 35 mL/min — ABNORMAL LOW (ref >90)
GFR calc non Af Amer: 30 mL/min — ABNORMAL LOW (ref >90)
GLUCOSE: 114 mg/dL — AB (ref 70–99)
POTASSIUM: 5.4 mmol/L — AB (ref 3.5–5.1)
Sodium: 125 mmol/L — ABNORMAL LOW (ref 135–145)
TOTAL PROTEIN: 7.5 g/dL (ref 6.0–8.3)
Total Bilirubin: 1.1 mg/dL (ref 0.3–1.2)

## 2014-06-17 LAB — CBC
HCT: 25.5 % — ABNORMAL LOW (ref 39.0–52.0)
Hemoglobin: 8.8 g/dL — ABNORMAL LOW (ref 13.0–17.0)
MCH: 31.9 pg (ref 26.0–34.0)
MCHC: 34.5 g/dL (ref 30.0–36.0)
MCV: 92.4 fL (ref 78.0–100.0)
Platelets: 162 10*3/uL (ref 150–400)
RBC: 2.76 MIL/uL — ABNORMAL LOW (ref 4.22–5.81)
RDW: 13.8 % (ref 11.5–15.5)
WBC: 13.1 10*3/uL — ABNORMAL HIGH (ref 4.0–10.5)

## 2014-06-17 LAB — LACTIC ACID, PLASMA: Lactic Acid, Venous: 2 mmol/L (ref 0.5–2.0)

## 2014-06-17 LAB — CBC WITH DIFFERENTIAL/PLATELET
BASOS ABS: 0 10*3/uL (ref 0.0–0.1)
BASOS PCT: 0 % (ref 0–1)
EOS ABS: 0 10*3/uL (ref 0.0–0.7)
Eosinophils Relative: 0 % (ref 0–5)
HCT: 34.6 % — ABNORMAL LOW (ref 39.0–52.0)
HEMOGLOBIN: 11.9 g/dL — AB (ref 13.0–17.0)
Lymphocytes Relative: 9 % — ABNORMAL LOW (ref 12–46)
Lymphs Abs: 1.4 10*3/uL (ref 0.7–4.0)
MCH: 31.7 pg (ref 26.0–34.0)
MCHC: 34.4 g/dL (ref 30.0–36.0)
MCV: 92.3 fL (ref 78.0–100.0)
Monocytes Absolute: 1.2 10*3/uL — ABNORMAL HIGH (ref 0.1–1.0)
Monocytes Relative: 8 % (ref 3–12)
Neutro Abs: 12.7 10*3/uL — ABNORMAL HIGH (ref 1.7–7.7)
Neutrophils Relative %: 83 % — ABNORMAL HIGH (ref 43–77)
Platelets: 216 10*3/uL (ref 150–400)
RBC: 3.75 MIL/uL — ABNORMAL LOW (ref 4.22–5.81)
RDW: 13.7 % (ref 11.5–15.5)
WBC: 15.3 10*3/uL — ABNORMAL HIGH (ref 4.0–10.5)

## 2014-06-17 LAB — APTT: aPTT: 35 seconds (ref 24–37)

## 2014-06-17 LAB — POC OCCULT BLOOD, ED: Fecal Occult Bld: POSITIVE — AB

## 2014-06-17 LAB — MRSA PCR SCREENING: MRSA BY PCR: NEGATIVE

## 2014-06-17 LAB — PROTIME-INR
INR: 1.17 (ref 0.00–1.49)
Prothrombin Time: 15 seconds (ref 11.6–15.2)

## 2014-06-17 SURGERY — EGD (ESOPHAGOGASTRODUODENOSCOPY)
Anesthesia: Moderate Sedation

## 2014-06-17 MED ORDER — TRIAMCINOLONE ACETONIDE 0.5 % EX CREA
1.0000 "application " | TOPICAL_CREAM | Freq: Three times a day (TID) | CUTANEOUS | Status: DC | PRN
  Filled 2014-06-17: qty 15

## 2014-06-17 MED ORDER — SODIUM CHLORIDE 0.9 % IJ SOLN
3.0000 mL | Freq: Two times a day (BID) | INTRAMUSCULAR | Status: DC
  Administered 2014-06-17 – 2014-06-20 (×7): 3 mL via INTRAVENOUS

## 2014-06-17 MED ORDER — PANTOPRAZOLE SODIUM 40 MG IV SOLR
INTRAVENOUS | Status: AC
  Filled 2014-06-17: qty 80

## 2014-06-17 MED ORDER — PANTOPRAZOLE SODIUM 40 MG IV SOLR
INTRAVENOUS | Status: AC
Start: 2014-06-17 — End: 2014-06-17
  Filled 2014-06-17: qty 80

## 2014-06-17 MED ORDER — MIDAZOLAM HCL 5 MG/5ML IJ SOLN
INTRAMUSCULAR | Status: AC
  Filled 2014-06-17: qty 10

## 2014-06-17 MED ORDER — ALBUTEROL SULFATE (2.5 MG/3ML) 0.083% IN NEBU
2.5000 mg | INHALATION_SOLUTION | Freq: Two times a day (BID) | RESPIRATORY_TRACT | Status: DC
  Administered 2014-06-17 – 2014-06-20 (×6): 2.5 mg via RESPIRATORY_TRACT
  Filled 2014-06-17 (×5): qty 3

## 2014-06-17 MED ORDER — FENTANYL CITRATE 0.05 MG/ML IJ SOLN
INTRAMUSCULAR | Status: DC | PRN
  Administered 2014-06-17: 25 ug via INTRAVENOUS

## 2014-06-17 MED ORDER — OLOPATADINE HCL 0.1 % OP SOLN
1.0000 [drp] | Freq: Two times a day (BID) | OPHTHALMIC | Status: DC
  Administered 2014-06-17 – 2014-06-20 (×7): 1 [drp] via OPHTHALMIC
  Filled 2014-06-17: qty 5

## 2014-06-17 MED ORDER — FENTANYL CITRATE 0.05 MG/ML IJ SOLN
INTRAMUSCULAR | Status: AC
  Filled 2014-06-17: qty 2

## 2014-06-17 MED ORDER — ATORVASTATIN CALCIUM 10 MG PO TABS
10.0000 mg | ORAL_TABLET | Freq: Two times a day (BID) | ORAL | Status: DC
  Administered 2014-06-17 – 2014-06-20 (×6): 10 mg via ORAL
  Filled 2014-06-17 (×7): qty 1

## 2014-06-17 MED ORDER — SODIUM CHLORIDE 0.9 % IV SOLN
1000.0000 mL | Freq: Once | INTRAVENOUS | Status: AC
  Administered 2014-06-17: 1000 mL via INTRAVENOUS

## 2014-06-17 MED ORDER — SODIUM CHLORIDE 0.9 % IV SOLN
1000.0000 mL | INTRAVENOUS | Status: DC
  Administered 2014-06-17: 1000 mL via INTRAVENOUS

## 2014-06-17 MED ORDER — FLUTICASONE PROPIONATE 50 MCG/ACT NA SUSP
2.0000 | Freq: Every day | NASAL | Status: DC
  Administered 2014-06-17 – 2014-06-20 (×4): 2 via NASAL
  Filled 2014-06-17: qty 16

## 2014-06-17 MED ORDER — PANTOPRAZOLE SODIUM 40 MG PO TBEC
40.0000 mg | DELAYED_RELEASE_TABLET | Freq: Two times a day (BID) | ORAL | Status: DC
  Administered 2014-06-18 – 2014-06-20 (×5): 40 mg via ORAL
  Filled 2014-06-17 (×6): qty 1

## 2014-06-17 MED ORDER — POLYETHYLENE GLYCOL 3350 17 G PO PACK
17.0000 g | PACK | Freq: Every day | ORAL | Status: DC
  Administered 2014-06-17 – 2014-06-19 (×3): 17 g via ORAL
  Filled 2014-06-17 (×4): qty 1

## 2014-06-17 MED ORDER — DOCUSATE SODIUM 100 MG PO CAPS
200.0000 mg | ORAL_CAPSULE | Freq: Every day | ORAL | Status: DC
  Administered 2014-06-17 – 2014-06-19 (×3): 200 mg via ORAL
  Filled 2014-06-17 (×3): qty 2

## 2014-06-17 MED ORDER — LIDOCAINE VISCOUS 2 % MT SOLN
OROMUCOSAL | Status: AC
  Filled 2014-06-17: qty 15

## 2014-06-17 MED ORDER — MIDAZOLAM HCL 5 MG/5ML IJ SOLN
INTRAMUSCULAR | Status: DC | PRN
  Administered 2014-06-17: 1 mg via INTRAVENOUS
  Administered 2014-06-17: 2 mg via INTRAVENOUS
  Administered 2014-06-17: 1 mg via INTRAVENOUS

## 2014-06-17 MED ORDER — SODIUM CHLORIDE 0.9 % IV BOLUS (SEPSIS)
1000.0000 mL | Freq: Once | INTRAVENOUS | Status: AC
  Administered 2014-06-17: 1000 mL via INTRAVENOUS

## 2014-06-17 MED ORDER — DONEPEZIL HCL 5 MG PO TABS
10.0000 mg | ORAL_TABLET | Freq: Every day | ORAL | Status: DC
  Administered 2014-06-17 – 2014-06-19 (×3): 10 mg via ORAL
  Filled 2014-06-17 (×3): qty 2
  Filled 2014-06-17 (×3): qty 1

## 2014-06-17 MED ORDER — SODIUM CHLORIDE 0.9 % IV SOLN
1000.0000 mL | INTRAVENOUS | Status: DC
  Administered 2014-06-17 – 2014-06-19 (×4): 1000 mL via INTRAVENOUS

## 2014-06-17 MED ORDER — SODIUM CHLORIDE 0.9 % IV SOLN
80.0000 mg | Freq: Once | INTRAVENOUS | Status: AC
  Administered 2014-06-17: 80 mg via INTRAVENOUS
  Filled 2014-06-17: qty 80

## 2014-06-17 MED ORDER — HYDROCODONE-ACETAMINOPHEN 5-325 MG PO TABS
1.0000 | ORAL_TABLET | Freq: Four times a day (QID) | ORAL | Status: DC | PRN
  Administered 2014-06-17 – 2014-06-20 (×4): 1 via ORAL
  Filled 2014-06-17 (×4): qty 1

## 2014-06-17 MED ORDER — MEPERIDINE HCL 100 MG/ML IJ SOLN
INTRAMUSCULAR | Status: AC
  Filled 2014-06-17: qty 2

## 2014-06-17 MED ORDER — SODIUM CHLORIDE 0.9 % IV SOLN
8.0000 mg/h | INTRAVENOUS | Status: DC
  Administered 2014-06-17 (×2): 8 mg/h via INTRAVENOUS
  Filled 2014-06-17 (×11): qty 80

## 2014-06-17 MED ORDER — ALBUTEROL SULFATE (2.5 MG/3ML) 0.083% IN NEBU
2.5000 mg | INHALATION_SOLUTION | Freq: Two times a day (BID) | RESPIRATORY_TRACT | Status: DC
  Administered 2014-06-17: 2.5 mg via RESPIRATORY_TRACT
  Filled 2014-06-17 (×2): qty 3

## 2014-06-17 MED ORDER — LEVOTHYROXINE SODIUM 100 MCG PO TABS
100.0000 ug | ORAL_TABLET | Freq: Every day | ORAL | Status: DC
  Administered 2014-06-17 – 2014-06-20 (×4): 100 ug via ORAL
  Filled 2014-06-17 (×4): qty 1
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
## 2014-06-17 NOTE — ED Notes (Signed)
Primary assessment was done upon arrival @ 2325. Sao2 increased to 84% with 4lnc and increased to 99% with 5lnc. O2 now down to 2lnc and sats holding @ 100%

## 2014-06-17 NOTE — Interval H&P Note (Signed)
History and Physical Interval Note:  06/17/2014 11:27 AM  [name] [name]  has presented today for surgery, with the diagnosis of Gastric bleed  The various methods of treatment have been discussed with the patient and family. After consideration of risks, benefits and other options for treatment, the patient has consented to  Procedure(s): ESOPHAGOGASTRODUODENOSCOPY (EGD) (N/A) as a surgical intervention .  The patient's history has been reviewed, patient examined, no change in status, stable for surgery.  I have reviewed the patient's chart and labs.  Questions were answered to the patient's satisfaction.     [employer]
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
## 2014-06-17 NOTE — Care Management Note (Addendum)
    Page 1 of 2   06/20/2014     1:14:06 PM CARE MANAGEMENT NOTE 06/20/2014  Patient:  [name] [name], [name] [name]   Account Number:  [id number]  Date Initiated:  06/17/2014  Documentation initiated by:  CHILDRESS,JESSICA  Subjective/Objective Assessment:   Pt is from home, lives alone but has family nearby that checks on him. Pt has cane, walker and wheelchair if needed but doesn't use them regularly. Pt has no other DME's or [place] services. Pt plans to discharge home with self care.     Action/Plan:   Family says they do not anticipate him needing anything, they feel he is still at baseline. No CM needs identified. Will cont to follow if pt does not discharge over weekend.   Anticipated DC Date:  06/18/2014   Anticipated DC Plan:  [place]  CM consult      [facility] Choice  HOME HEALTH   Choice offered to / List presented to:  C-1 Patient        [place] arranged  [place] [facility]      [place].   Status of service:  Completed, signed off Medicare Important Message given?  YES (If response is "NO", the following Medicare IM given date fields will be blank) Date Medicare IM given:  06/17/2014 Medicare IM given by:  Jolene Provost Date Additional Medicare IM given:   Additional Medicare IM given by:    Discharge Disposition:  HOME/SELF CARE  Per UR Regulation:  Reviewed for med. necessity/level of care/duration of stay  If discussed at [place] of Stay Meetings, dates discussed:    Comments:  06/20/14 [place], RN BSN CM Pt discharged home today. Pt and pts nurse aware of discharge arrangements.  06/20/14 [place], RN BSN CM PT recommends HH PT. Pt and daughter chooses [facility] (pt has used in the past). Romualdo Bolk of [facility] is aware and will collect the pts information from the chart. [place] services to start within 48 hours of discharge. Pt has a cane, walker, and w/c if needed. No other Cm needs noted.  06/17/2014 Reid Hope King, RN, MSN, CM

## 2014-06-17 NOTE — ED Notes (Signed)
Sleeping at this time, has remained fully alert, conversant, denies abd pain or rumbling. No further stools

## 2014-06-17 NOTE — Care Management Utilization Note (Signed)
UR completed 

## 2014-06-17 NOTE — ED Notes (Signed)
Sleeping, family has gone home
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
## 2014-06-17 NOTE — Progress Notes (Signed)
Patient seen and examined. Admitted earlier today with melena. Discussed with Dr. Oneida Alar a who plans to take patient for EGD today. Hb down to 8.8. Continue to follow Hb; no plans for transfusion at present.  [name] Mend, MD [facility] Pager: [phone]

## 2014-06-17 NOTE — Op Note (Signed)
[place], [id number]   ENDOSCOPY PROCEDURE REPORT  PATIENT: [name] [name], [name] [name]  MR#: [id number] BIRTHDATE: 17-Jul-1922 , 25  yrs. old GENDER: male  ENDOSCOPIST: Danie Binder, MD REFERRED PP:IRJJ Avel Sensor, M.D.  Evans Lance, M.D.  PROCEDURE DATE: 07/16/2014 PROCEDURE:   EGD w/ biopsy  INDICATIONS:melena. MEDICATIONS: Fentanyl 25 mcg IV and Versed 3 mg IV TOPICAL ANESTHETIC:   Viscous Xylocaine ASA CLASS:  DESCRIPTION OF PROCEDURE:     Physical exam was performed.  Informed consent was obtained from the patient after explaining the benefits, risks, and alternatives to the procedure.  The patient was connected to the monitor and placed in the left lateral position.  Continuous oxygen was provided by nasal cannula and IV medicine administered through an indwelling cannula.  After administration of sedation, the patients esophagus was intubated and the [id number] ([id number])  endoscope was advanced under direct visualization to the second portion of the duodenum.  The scope was removed slowly by carefully examining the color, texture, anatomy, and integrity of the mucosa on the way out.  The patient was recovered in endoscopy and discharged home in satisfactory condition.   ESOPHAGUS: There was a stricture at the gastroesophageal junction. The stricture was easily traversable.   STOMACH: Multiple non-bleeding, clean-based and deep ulcers ranging between 3-5 mm in size with surrounding edema were found in the prepyloric region of the stomach and gastric antrum.   DUODENUM: Moderate duodenal inflammation was found in the duodenal bulb.   The duodenal mucosa showed no abnormalities in the 2nd part of the duodenum. COMPLICATIONS: There were no immediate complications.  ENDOSCOPIC IMPRESSION: 1.   PATENT stricture at the gastroesophageal junction 2.   UGIB DUE TO Multiple GASTRIC ulcers 3.   MODERATE DUODENITIS  RECOMMENDATIONS: BID  PPI FOR 3 MOS THEN ONCE DAILY FOREVER IF PT CONTINUES ON ASA. HOLD ASA.  RE-START IN 2 WEEKS. AVOID NSAIDS FOR ONE MO. VICODIN 1/2-1 PO Q6H PRN PAIN AWAIT BIOPSY OPV IN 2 MOS W/ DR.  Ginnie Marich REPEAT EGD IN 3 MOS TO ASSESS HEALING  REPEAT EXAM: eSigned:  Danie Binder, MD July 16, 2014 2:25 PM   CPT CODES: ICD CODES:  The ICD and CPT codes recommended by this software are interpretations from the data that the clinical staff has captured with the software.  The verification of the translation of this report to the ICD and CPT codes and modifiers is the sole responsibility of the health care institution and practicing physician where this report was generated.  [employer] will not be held responsible for the validity of the ICD and CPT codes included on this report.  AMA assumes no liability for data contained or not contained herein. CPT is a Designer, television/film set of the [employer].

## 2014-06-18 DIAGNOSIS — K250 Acute gastric ulcer with hemorrhage: Secondary | ICD-10-CM

## 2014-06-18 DIAGNOSIS — N189 Chronic kidney disease, unspecified: Secondary | ICD-10-CM

## 2014-06-18 DIAGNOSIS — D631 Anemia in chronic kidney disease: Secondary | ICD-10-CM

## 2014-06-18 DIAGNOSIS — D6489 Other specified anemias: Secondary | ICD-10-CM

## 2014-06-18 DIAGNOSIS — Z7982 Long term (current) use of aspirin: Secondary | ICD-10-CM

## 2014-06-18 DIAGNOSIS — I951 Orthostatic hypotension: Secondary | ICD-10-CM

## 2014-06-18 DIAGNOSIS — K921 Melena: Secondary | ICD-10-CM

## 2014-06-18 LAB — CBC
HCT: 22.5 % — ABNORMAL LOW (ref 39.0–52.0)
Hemoglobin: 7.7 g/dL — ABNORMAL LOW (ref 13.0–17.0)
MCH: 32.5 pg (ref 26.0–34.0)
MCHC: 34.2 g/dL (ref 30.0–36.0)
MCV: 94.9 fL (ref 78.0–100.0)
Platelets: 131 10*3/uL — ABNORMAL LOW (ref 150–400)
RBC: 2.37 MIL/uL — AB (ref 4.22–5.81)
RDW: 14.8 % (ref 11.5–15.5)
WBC: 12.3 10*3/uL — ABNORMAL HIGH (ref 4.0–10.5)

## 2014-06-18 MED ORDER — SODIUM POLYSTYRENE SULFONATE 15 GM/60ML PO SUSP
30.0000 g | Freq: Once | ORAL | Status: AC
  Administered 2014-06-18: 30 g via ORAL
  Filled 2014-06-18: qty 120

## 2014-06-18 NOTE — Progress Notes (Signed)
  Subjective:  Patient has no complaints. He denies nausea vomiting dysphagia heartburn or abdominal pain. He has not had a bowel movement in 24 hours.    Objective: Blood pressure 114/41, pulse 73, temperature 97.9 F (36.6 C), temperature source Oral, resp. rate 20, height 5\' 4"  (1.626 m), weight 147 lb 14.9 oz (67.1 kg), SpO2 98 %. Patient is alert and in no acute distress. Conjunctiva was pale. Sclerae nonicteric. Abdomen is symmetrical soft with mild midepigastric tenderness. No LE edema noted.  Labs/studies Results:   Recent Labs  06/16/14 2350 06/17/14 0506 06/18/14 1033  WBC 15.3* 13.1* 12.3*  HGB 11.9* 8.8* 7.7*  HCT 34.6* 25.5* 22.5*  PLT 216 162 131*    BMET   Recent Labs  06/16/14 2350 06/17/14 0506  NA 125* 128*  K 5.4* 5.2*  CL 92* 101  CO2 22 21  GLUCOSE 114* 95  BUN 62* 63*  CREATININE 1.86* 1.72*  CALCIUM 9.1 7.8*    LFT   Recent Labs  06/16/14 2350  PROT 7.5  ALBUMIN 4.0  AST 33  ALT 28  ALKPHOS 155*  BILITOT 1.1     Assessment:  #1. Upper GI bleed secondary to multiple gastric ulcers. Endoscopically these ulcers appear to be benign. Biopsies pending. Patient has been on low-dose aspirin. No evidence of active bleeding. #2. Anemia secondary to upper GI bleed.    Recommendations;  H&H later today as planned. Unless hemoglobin starts to creep up he will need another unit of PRBCs. Continue pantoprazole at 40 mg by mouth twice a day. Await gastric biopsy results.

## 2014-06-18 NOTE — Progress Notes (Signed)
Called report to nurse RN that is covering 338 on floor 300. Transported all belongings and medications when we transported patient on bed that he was in. No complaints or problems during transfer.
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
## 2014-06-19 DIAGNOSIS — D62 Acute posthemorrhagic anemia: Secondary | ICD-10-CM

## 2014-06-19 LAB — CBC
HEMATOCRIT: 22.5 % — AB (ref 39.0–52.0)
HEMATOCRIT: 28.2 % — AB (ref 39.0–52.0)
HEMOGLOBIN: 7.7 g/dL — AB (ref 13.0–17.0)
HEMOGLOBIN: 9.6 g/dL — AB (ref 13.0–17.0)
MCH: 32.1 pg (ref 26.0–34.0)
MCH: 32.2 pg (ref 26.0–34.0)
MCHC: 34.2 g/dL (ref 30.0–36.0)
MCHC: 34 g/dL (ref 30.0–36.0)
MCV: 93.8 fL (ref 78.0–100.0)
MCV: 94.6 fL (ref 78.0–100.0)
PLATELETS: 138 10*3/uL — AB (ref 150–400)
Platelets: 136 10*3/uL — ABNORMAL LOW (ref 150–400)
RBC: 2.4 MIL/uL — ABNORMAL LOW (ref 4.22–5.81)
RBC: 2.98 MIL/uL — AB (ref 4.22–5.81)
RDW: 14.6 % (ref 11.5–15.5)
RDW: 14.5 % (ref 11.5–15.5)
WBC: 13.3 10*3/uL — ABNORMAL HIGH (ref 4.0–10.5)
WBC: 12.1 10*3/uL — ABNORMAL HIGH (ref 4.0–10.5)

## 2014-06-19 LAB — BASIC METABOLIC PANEL
Anion gap: 7 (ref 5–15)
BUN: 39 mg/dL — ABNORMAL HIGH (ref 6–23)
CALCIUM: 7.9 mg/dL — AB (ref 8.4–10.5)
CO2: 22 mmol/L (ref 19–32)
CREATININE: 1.62 mg/dL — AB (ref 0.50–1.35)
Chloride: 105 mmol/L (ref 96–112)
GFR calc non Af Amer: 36 mL/min — ABNORMAL LOW (ref >90)
GFR, EST AFRICAN AMERICAN: 42 mL/min — AB (ref >90)
Glucose, Bld: 101 mg/dL — ABNORMAL HIGH (ref 70–99)
Potassium: 4.3 mmol/L (ref 3.5–5.1)
SODIUM: 134 mmol/L — AB (ref 135–145)

## 2014-06-19 LAB — PREPARE RBC (CROSSMATCH)

## 2014-06-19 LAB — ABO/RH: ABO/RH(D): A POS

## 2014-06-19 MED ORDER — SODIUM CHLORIDE 0.9 % IV SOLN
Freq: Once | INTRAVENOUS | Status: AC
  Administered 2014-06-19: 10 mL via INTRAVENOUS

## 2014-06-19 NOTE — Progress Notes (Signed)
Notifed Jerilee Hoh MD of new hgb 9.6 post transfusion.

## 2014-06-19 NOTE — Progress Notes (Signed)
Per patients permission I discussed the patients plan of care as far as what the patients hgb was for this am, orders for blood and recheck of hgb, and for activity today as far as the patient being out of bed to chair.  She verbalized understanding.  No complaints or concerns voiced at this time.

## 2014-06-19 NOTE — Plan of Care (Signed)
Patient was gotten OOB at approx 1000.  He got up with minimal assistance and assisted with his bath.  He tolerated the activity well.
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
## 2014-06-20 ENCOUNTER — Telehealth: Payer: Self-pay | Admitting: Gastroenterology

## 2014-06-20 ENCOUNTER — Encounter: Payer: Self-pay | Admitting: Gastroenterology

## 2014-06-20 DIAGNOSIS — K7460 Unspecified cirrhosis of liver: Secondary | ICD-10-CM

## 2014-06-20 LAB — TYPE AND SCREEN
ABO/RH(D): A POS
ANTIBODY SCREEN: NEGATIVE
UNIT DIVISION: 0

## 2014-06-20 LAB — BASIC METABOLIC PANEL
Anion gap: 7 (ref 5–15)
BUN: 28 mg/dL — AB (ref 6–23)
CO2: 23 mmol/L (ref 19–32)
Calcium: 7.9 mg/dL — ABNORMAL LOW (ref 8.4–10.5)
Chloride: 104 mmol/L (ref 96–112)
Creatinine, Ser: 1.39 mg/dL — ABNORMAL HIGH (ref 0.50–1.35)
GFR calc Af Amer: 50 mL/min — ABNORMAL LOW (ref >90)
GFR, EST NON AFRICAN AMERICAN: 43 mL/min — AB (ref >90)
GLUCOSE: 98 mg/dL (ref 70–99)
Potassium: 3.9 mmol/L (ref 3.5–5.1)
Sodium: 134 mmol/L — ABNORMAL LOW (ref 135–145)

## 2014-06-20 LAB — CBC
HCT: 25.5 % — ABNORMAL LOW (ref 39.0–52.0)
HEMOGLOBIN: 8.8 g/dL — AB (ref 13.0–17.0)
MCH: 32.2 pg (ref 26.0–34.0)
MCHC: 34.5 g/dL (ref 30.0–36.0)
MCV: 93.4 fL (ref 78.0–100.0)
Platelets: 141 10*3/uL — ABNORMAL LOW (ref 150–400)
RBC: 2.73 MIL/uL — ABNORMAL LOW (ref 4.22–5.81)
RDW: 14.6 % (ref 11.5–15.5)
WBC: 10.4 10*3/uL (ref 4.0–10.5)

## 2014-06-20 MED ORDER — PANTOPRAZOLE SODIUM 40 MG PO TBEC: 40.0000 mg | DELAYED_RELEASE_TABLET | Freq: Two times a day (BID) | ORAL | Status: DC

## 2014-06-20 MED ORDER — TRAMADOL HCL 50 MG PO TBDP: 1.0000 | ORAL_TABLET | Freq: Four times a day (QID) | ORAL | Status: DC | PRN

## 2014-06-20 NOTE — Progress Notes (Signed)
    Subjective: Large black stool this morning. No abdominal pain, N/V. On complaint is back pain.   Objective: Vital signs in last 24 hours: Temp:  [98 F (36.7 C)-98.4 F (36.9 C)] 98.2 F (36.8 C) (03/28 0611) Pulse Rate:  [66-74] 66 (03/28 0611) Resp:  [20] 20 (03/28 0611) BP: (114-146)/(47-77) 130/77 mmHg (03/28 0611) SpO2:  [95 %-98 %] 98 % (03/28 0701) Last BM Date: 06/16/14 General:   Alert and oriented, pleasant Head:  Normocephalic and atraumatic. Eyes:  No icterus, sclera clear. Conjuctiva pink.  Abdomen:  Bowel sounds present, soft, non-tender, non-distended. No HSM or hernias noted.  Neurologic:  Alert and  oriented x4 Psych:  Alert and cooperative. Normal mood and affect.  Intake/Output from previous day: 03/27 0701 - 03/28 0700 In: 1070 [P.O.:720; Blood:350] Out: 1600 [Urine:1600] Intake/Output this shift:    Lab Results:  Recent Labs  06/19/14 0603 06/19/14 1714 06/20/14 0622  WBC 13.3* 12.1* 10.4  HGB 7.7* 9.6* 8.8*  HCT 22.5* 28.2* 25.5*  PLT 136* 138* 141*   BMET  Recent Labs  06/19/14 0603 06/20/14 0622  NA 134* 134*  K 4.3 3.9  CL 105 104  CO2 22 23  GLUCOSE 101* 98  BUN 39* 28*  CREATININE 1.62* 1.39*  CALCIUM 7.9* 7.9*    Assessment: 81 year old male admitted with upper GI bleed secondary to NSAID-induced PUD. Drift in Hgb from yesterday, down to 8.8. Large black stool this morning. Would monitor serial H/H and hopeful discharge tomorrow if no further overt GI bleeding and stable H/H. As of note, cirrhosis reported on CT Feb 2016. Will arrange outpatient follow-up for cirrhosis care.     Plan: BID PPI for 3 months then daily indefinitely Restart aspirin in 2 weeks Avoid NSAIDs  Follow-up on biopsy Repeat EGD in 3 months Follow-up in approximately 4 weeks for cirrhosis care   Orvil Feil, ANP-BC [facility]     LOS: 3 days    06/20/2014, 10:27 AM

## 2014-06-20 NOTE — Telephone Encounter (Signed)
Please have patient follow-up with [place] in 4 weeks for hospital follow-up.

## 2014-06-20 NOTE — Discharge Instructions (Signed)
Hold aspirin and restart in 2 weeks.

## 2014-06-20 NOTE — Progress Notes (Signed)
Patient discharged with instructions, prescriptions, and care notes.  He and his family verbalized understanding.   Verbalized understanding via teach back.  IV was removed and the site was WNL. Patient voiced no further complaints or concerns at the time of discharge.  Appointments scheduled per instructions.  Patient left the floor via w/c with staff and family in stable condition.

## 2014-06-20 NOTE — Telephone Encounter (Signed)
APPOINTMENT MADE AND LETTER SENT °
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
## 2014-06-20 NOTE — Progress Notes (Signed)
Notified [place] per Dr. Jerilee Hoh request for the patient to have an f/u appointment.  Spoke with Jackelyn Poling and the appointment was made for April 20, at 2 pm and instructed to be there at 1330.  I notified Butch Penny and spoke with the patients daughter and told her about the appointment.  She verbalized understanding but stated that she wasn't sure she needed an appoint ment with Dr. Nelva Bush, that he may need to see another specialist since she stated that MD had done all they could do for Mr. Westall.
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
## 2014-06-20 NOTE — Progress Notes (Signed)
Dr. Jerilee Hoh and Sherle Poe notified of the patient having a large black stool.  MD stated that she verbalized to the patient that he may would have a few more black stools due to the ulcers, and Vicente Males said she would see the patient.  The patient did not complain of any abdominal pain opr discomfort with the  BM he actually state that he felt better since he went.

## 2014-06-20 NOTE — Progress Notes (Signed)
Butch Penny the patients daughter n law called me voicing concerns about the patient being discharged with his current hgb and not having it rechecked in the morning.  She was afraid that he would pass out according to her.  I voiced to her after discussing the issue with Dr. Jerilee Hoh she stated that he was stable and baseline, and that the dark stool this am was probably residual blood since he had not had a BM since admission.  I voiced to the daughter n law and to his daughter that I would call the MD so that she could talk to her.  MD called me back and spoke with the family.

## 2014-06-21 ENCOUNTER — Telehealth: Payer: Self-pay | Admitting: *Deleted

## 2014-06-21 NOTE — Telephone Encounter (Signed)
Daughter Ivin Booty) called back completed TCM call below..  Transition Care Management Follow-up Telephone Call D/C 06/20/14  How have you been since you were released from the hospital? Pt daughter return call back she stated dad is doing ok since being home   Do you understand why you were in the hospital? YES   Do you understand the discharge instrcutions? YES  Items Reviewed:  Medications reviewed: YES  Allergies reviewed: YES  Dietary changes reviewed: YES, low sodium & heart healthy  Referrals reviewed: Yes, [facility] has contact [facility]   Functional Questionnaire:   Activities of Daily Living (ADLs):   Daughter states he are independent in the following: ambulation, bathing and hygiene, feeding, continence, grooming, toileting and dressing She States he gets around pretty good not walking as much de to the back pain but he is able to walk & do for himself   Any transportation issues/concerns?: NO   Any patient concerns? NO   Confirmed importance and date/time of follow-up visits scheduled: YES, already had f/u appt w/Dr. Camila Li on 06/27/14 made that appt a TCM appt  Confirmed with patient if condition begins to worsen call PCP or go to the ER.

## 2014-06-21 NOTE — Telephone Encounter (Signed)
Called pt & daughter concerning TCM appt no answer LMOM RTC...Johny Chess

## 2014-06-21 NOTE — Progress Notes (Signed)
UR chart review completed.  

## 2014-06-22 DIAGNOSIS — I10 Essential (primary) hypertension: Secondary | ICD-10-CM | POA: Diagnosis not present

## 2014-06-22 DIAGNOSIS — K250 Acute gastric ulcer with hemorrhage: Secondary | ICD-10-CM | POA: Diagnosis not present

## 2014-06-22 DIAGNOSIS — S22049D Unspecified fracture of fourth thoracic vertebra, subsequent encounter for fracture with routine healing: Secondary | ICD-10-CM | POA: Diagnosis not present

## 2014-06-22 DIAGNOSIS — Z9181 History of falling: Secondary | ICD-10-CM | POA: Diagnosis not present

## 2014-06-22 DIAGNOSIS — K7460 Unspecified cirrhosis of liver: Secondary | ICD-10-CM | POA: Diagnosis not present

## 2014-06-22 DIAGNOSIS — E871 Hypo-osmolality and hyponatremia: Secondary | ICD-10-CM | POA: Diagnosis not present

## 2014-06-23 DIAGNOSIS — E871 Hypo-osmolality and hyponatremia: Secondary | ICD-10-CM | POA: Diagnosis not present

## 2014-06-23 DIAGNOSIS — I10 Essential (primary) hypertension: Secondary | ICD-10-CM | POA: Diagnosis not present

## 2014-06-23 DIAGNOSIS — S22049D Unspecified fracture of fourth thoracic vertebra, subsequent encounter for fracture with routine healing: Secondary | ICD-10-CM | POA: Diagnosis not present

## 2014-06-23 DIAGNOSIS — K250 Acute gastric ulcer with hemorrhage: Secondary | ICD-10-CM | POA: Diagnosis not present

## 2014-06-23 DIAGNOSIS — Z9181 History of falling: Secondary | ICD-10-CM | POA: Diagnosis not present

## 2014-06-23 DIAGNOSIS — K7460 Unspecified cirrhosis of liver: Secondary | ICD-10-CM | POA: Diagnosis not present

## 2014-06-24 DIAGNOSIS — Z9181 History of falling: Secondary | ICD-10-CM | POA: Diagnosis not present

## 2014-06-24 DIAGNOSIS — E871 Hypo-osmolality and hyponatremia: Secondary | ICD-10-CM | POA: Diagnosis not present

## 2014-06-24 DIAGNOSIS — S22049D Unspecified fracture of fourth thoracic vertebra, subsequent encounter for fracture with routine healing: Secondary | ICD-10-CM | POA: Diagnosis not present

## 2014-06-24 DIAGNOSIS — I10 Essential (primary) hypertension: Secondary | ICD-10-CM | POA: Diagnosis not present

## 2014-06-24 DIAGNOSIS — K250 Acute gastric ulcer with hemorrhage: Secondary | ICD-10-CM | POA: Diagnosis not present

## 2014-06-24 DIAGNOSIS — K7460 Unspecified cirrhosis of liver: Secondary | ICD-10-CM | POA: Diagnosis not present

## 2014-06-27 ENCOUNTER — Encounter: Payer: Self-pay | Admitting: Internal Medicine

## 2014-06-27 ENCOUNTER — Ambulatory Visit (INDEPENDENT_AMBULATORY_CARE_PROVIDER_SITE_OTHER): Payer: Medicare Other | Admitting: Internal Medicine

## 2014-06-27 ENCOUNTER — Other Ambulatory Visit (INDEPENDENT_AMBULATORY_CARE_PROVIDER_SITE_OTHER): Payer: Medicare Other

## 2014-06-27 VITALS — BP 152/82 | HR 88 | Wt 153.0 lb

## 2014-06-27 DIAGNOSIS — Z9181 History of falling: Secondary | ICD-10-CM | POA: Diagnosis not present

## 2014-06-27 DIAGNOSIS — R413 Other amnesia: Secondary | ICD-10-CM

## 2014-06-27 DIAGNOSIS — D62 Acute posthemorrhagic anemia: Secondary | ICD-10-CM | POA: Diagnosis not present

## 2014-06-27 DIAGNOSIS — S22040A Wedge compression fracture of fourth thoracic vertebra, initial encounter for closed fracture: Secondary | ICD-10-CM

## 2014-06-27 DIAGNOSIS — K7460 Unspecified cirrhosis of liver: Secondary | ICD-10-CM | POA: Diagnosis not present

## 2014-06-27 DIAGNOSIS — E871 Hypo-osmolality and hyponatremia: Secondary | ICD-10-CM | POA: Diagnosis not present

## 2014-06-27 DIAGNOSIS — K250 Acute gastric ulcer with hemorrhage: Secondary | ICD-10-CM | POA: Diagnosis not present

## 2014-06-27 DIAGNOSIS — I2510 Atherosclerotic heart disease of native coronary artery without angina pectoris: Secondary | ICD-10-CM

## 2014-06-27 DIAGNOSIS — I5022 Chronic systolic (congestive) heart failure: Secondary | ICD-10-CM | POA: Diagnosis not present

## 2014-06-27 DIAGNOSIS — S22049D Unspecified fracture of fourth thoracic vertebra, subsequent encounter for fracture with routine healing: Secondary | ICD-10-CM | POA: Diagnosis not present

## 2014-06-27 DIAGNOSIS — I10 Essential (primary) hypertension: Secondary | ICD-10-CM | POA: Diagnosis not present

## 2014-06-27 LAB — BASIC METABOLIC PANEL
BUN: 20 mg/dL (ref 6–23)
CO2: 27 mEq/L (ref 19–32)
Calcium: 8.9 mg/dL (ref 8.4–10.5)
Chloride: 97 mEq/L (ref 96–112)
Creatinine, Ser: 1.52 mg/dL — ABNORMAL HIGH (ref 0.40–1.50)
GFR: 46.04 mL/min — ABNORMAL LOW (ref >60.00)
Glucose, Bld: 95 mg/dL (ref 70–99)
Potassium: 4.3 mEq/L (ref 3.5–5.1)
Sodium: 129 mEq/L — ABNORMAL LOW (ref 135–145)

## 2014-06-27 LAB — CBC WITH DIFFERENTIAL/PLATELET
BASOS PCT: 0.9 % (ref 0.0–3.0)
Basophils Absolute: 0.1 10*3/uL (ref 0.0–0.1)
Eosinophils Absolute: 0.5 10*3/uL (ref 0.0–0.7)
Eosinophils Relative: 5.7 % — ABNORMAL HIGH (ref 0.0–5.0)
HCT: 29.3 % — ABNORMAL LOW (ref 39.0–52.0)
HEMOGLOBIN: 10.1 g/dL — AB (ref 13.0–17.0)
LYMPHS PCT: 19 % (ref 12.0–46.0)
Lymphs Abs: 1.7 10*3/uL (ref 0.7–4.0)
MCHC: 34.5 g/dL (ref 30.0–36.0)
MCV: 93.1 fl (ref 78.0–100.0)
MONOS PCT: 16.3 % — AB (ref 3.0–12.0)
Monocytes Absolute: 1.4 10*3/uL — ABNORMAL HIGH (ref 0.1–1.0)
Neutro Abs: 5.1 10*3/uL (ref 1.4–7.7)
Neutrophils Relative %: 58.1 % (ref 43.0–77.0)
Platelets: 227 10*3/uL (ref 150.0–400.0)
RBC: 3.14 Mil/uL — AB (ref 4.22–5.81)
RDW: 14.9 % (ref 11.5–15.5)
WBC: 8.7 10*3/uL (ref 4.0–10.5)

## 2014-06-27 MED ORDER — TRAMADOL HCL 50 MG PO TBDP: 1.0000 | ORAL_TABLET | Freq: Four times a day (QID) | ORAL | Status: DC | PRN

## 2014-06-27 NOTE — Assessment & Plan Note (Signed)
Tramadol and Tylenol prn Kyphoplasty with Dr Patrecia Pour pending

## 2014-06-27 NOTE — Assessment & Plan Note (Signed)
Labs

## 2014-06-27 NOTE — Progress Notes (Signed)
Pre visit review using our clinic review tool, if applicable. No additional management support is needed unless otherwise documented below in the visit note. 
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
## 2014-06-27 NOTE — Patient Instructions (Signed)
Take Vit D and TUMs 2 a day

## 2014-06-27 NOTE — Assessment & Plan Note (Addendum)
Tramadol and Tylenol prn Kyphoplasty with Dr Estanislado Pandy pending

## 2014-06-28 NOTE — Assessment & Plan Note (Signed)
Doing well 

## 2014-06-28 NOTE — Assessment & Plan Note (Signed)
On Lipitor 

## 2014-07-01 ENCOUNTER — Other Ambulatory Visit: Payer: Self-pay | Admitting: Radiology

## 2014-07-01 ENCOUNTER — Other Ambulatory Visit (HOSPITAL_COMMUNITY): Payer: Self-pay | Admitting: Interventional Radiology

## 2014-07-01 DIAGNOSIS — S22040A Wedge compression fracture of fourth thoracic vertebra, initial encounter for closed fracture: Secondary | ICD-10-CM

## 2014-07-01 DIAGNOSIS — M549 Dorsalgia, unspecified: Secondary | ICD-10-CM

## 2014-07-01 DIAGNOSIS — IMO0002 Reserved for concepts with insufficient information to code with codable children: Secondary | ICD-10-CM

## 2014-07-04 ENCOUNTER — Encounter (HOSPITAL_COMMUNITY): Payer: Self-pay

## 2014-07-04 ENCOUNTER — Ambulatory Visit (HOSPITAL_COMMUNITY)
Admission: RE | Admit: 2014-07-04 | Discharge: 2014-07-04 | Disposition: A | Discharge status: Home / Self Care | Payer: No Typology Code available for payment source | Source: Ambulatory Visit | Attending: Interventional Radiology | Admitting: Interventional Radiology

## 2014-07-04 DIAGNOSIS — I10 Essential (primary) hypertension: Secondary | ICD-10-CM | POA: Diagnosis not present

## 2014-07-04 DIAGNOSIS — S22040A Wedge compression fracture of fourth thoracic vertebra, initial encounter for closed fracture: Secondary | ICD-10-CM

## 2014-07-04 DIAGNOSIS — M4854XA Collapsed vertebra, not elsewhere classified, thoracic region, initial encounter for fracture: Secondary | ICD-10-CM | POA: Diagnosis not present

## 2014-07-04 DIAGNOSIS — S22048A Other fracture of fourth thoracic vertebra, initial encounter for closed fracture: Secondary | ICD-10-CM | POA: Insufficient documentation

## 2014-07-04 DIAGNOSIS — Z87891 Personal history of nicotine dependence: Secondary | ICD-10-CM | POA: Diagnosis not present

## 2014-07-04 DIAGNOSIS — H9190 Unspecified hearing loss, unspecified ear: Secondary | ICD-10-CM | POA: Diagnosis not present

## 2014-07-04 DIAGNOSIS — Z9049 Acquired absence of other specified parts of digestive tract: Secondary | ICD-10-CM | POA: Insufficient documentation

## 2014-07-04 DIAGNOSIS — M1990 Unspecified osteoarthritis, unspecified site: Secondary | ICD-10-CM | POA: Diagnosis not present

## 2014-07-04 DIAGNOSIS — I2510 Atherosclerotic heart disease of native coronary artery without angina pectoris: Secondary | ICD-10-CM | POA: Insufficient documentation

## 2014-07-04 DIAGNOSIS — Z950 Presence of cardiac pacemaker: Secondary | ICD-10-CM | POA: Diagnosis not present

## 2014-07-04 DIAGNOSIS — Z79899 Other long term (current) drug therapy: Secondary | ICD-10-CM | POA: Insufficient documentation

## 2014-07-04 DIAGNOSIS — N400 Enlarged prostate without lower urinary tract symptoms: Secondary | ICD-10-CM | POA: Insufficient documentation

## 2014-07-04 DIAGNOSIS — M549 Dorsalgia, unspecified: Secondary | ICD-10-CM

## 2014-07-04 DIAGNOSIS — E039 Hypothyroidism, unspecified: Secondary | ICD-10-CM | POA: Insufficient documentation

## 2014-07-04 DIAGNOSIS — N289 Disorder of kidney and ureter, unspecified: Secondary | ICD-10-CM | POA: Diagnosis not present

## 2014-07-04 DIAGNOSIS — M810 Age-related osteoporosis without current pathological fracture: Secondary | ICD-10-CM | POA: Insufficient documentation

## 2014-07-04 DIAGNOSIS — S22049D Unspecified fracture of fourth thoracic vertebra, subsequent encounter for fracture with routine healing: Secondary | ICD-10-CM | POA: Diagnosis not present

## 2014-07-04 LAB — BASIC METABOLIC PANEL
Anion gap: 12 (ref 5–15)
BUN: 24 mg/dL — AB (ref 6–23)
CHLORIDE: 99 mmol/L (ref 96–112)
CO2: 23 mmol/L (ref 19–32)
Calcium: 9.1 mg/dL (ref 8.4–10.5)
Creatinine, Ser: 1.7 mg/dL — ABNORMAL HIGH (ref 0.50–1.35)
GFR calc Af Amer: 39 mL/min — ABNORMAL LOW (ref >90)
GFR, EST NON AFRICAN AMERICAN: 34 mL/min — AB (ref >90)
Glucose, Bld: 94 mg/dL (ref 70–99)
POTASSIUM: 4.4 mmol/L (ref 3.5–5.1)
Sodium: 134 mmol/L — ABNORMAL LOW (ref 135–145)

## 2014-07-04 LAB — CBC WITH DIFFERENTIAL/PLATELET
BASOS ABS: 0.1 10*3/uL (ref 0.0–0.1)
Basophils Relative: 1 % (ref 0–1)
EOS ABS: 0.7 10*3/uL (ref 0.0–0.7)
EOS PCT: 7 % — AB (ref 0–5)
HCT: 33.1 % — ABNORMAL LOW (ref 39.0–52.0)
Hemoglobin: 10.9 g/dL — ABNORMAL LOW (ref 13.0–17.0)
Lymphocytes Relative: 17 % (ref 12–46)
Lymphs Abs: 1.6 10*3/uL (ref 0.7–4.0)
MCH: 31.3 pg (ref 26.0–34.0)
MCHC: 32.9 g/dL (ref 30.0–36.0)
MCV: 95.1 fL (ref 78.0–100.0)
Monocytes Absolute: 1.5 10*3/uL — ABNORMAL HIGH (ref 0.1–1.0)
Monocytes Relative: 16 % — ABNORMAL HIGH (ref 3–12)
NEUTROS ABS: 5.5 10*3/uL (ref 1.7–7.7)
NEUTROS PCT: 59 % (ref 43–77)
Platelets: 205 10*3/uL (ref 150–400)
RBC: 3.48 MIL/uL — ABNORMAL LOW (ref 4.22–5.81)
RDW: 14.4 % (ref 11.5–15.5)
WBC: 9.3 10*3/uL (ref 4.0–10.5)

## 2014-07-04 LAB — APTT: aPTT: 28 seconds (ref 24–37)

## 2014-07-04 LAB — PROTIME-INR
INR: 1.1 (ref 0.00–1.49)
PROTHROMBIN TIME: 14.3 s (ref 11.6–15.2)

## 2014-07-04 MED ORDER — CEFAZOLIN SODIUM-DEXTROSE 2-3 GM-% IV SOLR: 2.0000 g | Freq: Once | INTRAVENOUS | Status: DC

## 2014-07-04 MED ORDER — SODIUM CHLORIDE 0.9 % IV SOLN: INTRAVENOUS | Status: AC

## 2014-07-04 MED ORDER — NALOXONE HCL 0.4 MG/ML IJ SOLN
INTRAMUSCULAR | Status: AC
  Filled 2014-07-04: qty 1

## 2014-07-04 MED ORDER — CEFAZOLIN SODIUM-DEXTROSE 2-3 GM-% IV SOLR
INTRAVENOUS | Status: AC
  Filled 2014-07-04: qty 50

## 2014-07-04 MED ORDER — FENTANYL CITRATE 0.05 MG/ML IJ SOLN
INTRAMUSCULAR | Status: AC | PRN
  Administered 2014-07-04: 25 ug via INTRAVENOUS
  Administered 2014-07-04 (×2): 12.5 ug via INTRAVENOUS

## 2014-07-04 MED ORDER — SODIUM CHLORIDE 0.9 % IV SOLN
Freq: Once | INTRAVENOUS | Status: AC
  Administered 2014-07-04: 10:00:00 via INTRAVENOUS

## 2014-07-04 MED ORDER — BUPIVACAINE HCL (PF) 0.25 % IJ SOLN
INTRAMUSCULAR | Status: AC
  Filled 2014-07-04: qty 30

## 2014-07-04 MED ORDER — MIDAZOLAM HCL 2 MG/2ML IJ SOLN
INTRAMUSCULAR | Status: AC | PRN
  Administered 2014-07-04 (×2): 0.5 mg via INTRAVENOUS
  Administered 2014-07-04: 1 mg via INTRAVENOUS

## 2014-07-04 MED ORDER — MIDAZOLAM HCL 2 MG/2ML IJ SOLN
INTRAMUSCULAR | Status: AC
  Filled 2014-07-04: qty 4

## 2014-07-04 MED ORDER — FLUMAZENIL 0.5 MG/5ML IV SOLN
INTRAVENOUS | Status: AC
  Filled 2014-07-04: qty 5

## 2014-07-04 MED ORDER — FENTANYL CITRATE 0.05 MG/ML IJ SOLN
INTRAMUSCULAR | Status: AC
  Filled 2014-07-04: qty 4

## 2014-07-04 MED ORDER — IOHEXOL 300 MG/ML  SOLN
50.0000 mL | Freq: Once | INTRAMUSCULAR | Status: AC | PRN
  Administered 2014-07-04: 5 mL via INTRAVENOUS

## 2014-07-04 MED ORDER — HYDROMORPHONE HCL 1 MG/ML IJ SOLN
INTRAMUSCULAR | Status: AC
  Filled 2014-07-04: qty 1

## 2014-07-04 MED ORDER — TOBRAMYCIN SULFATE 1.2 G IJ SOLR
INTRAMUSCULAR | Status: AC
  Filled 2014-07-04: qty 1.2

## 2014-07-04 NOTE — Procedures (Signed)
S/P T4 VP

## 2014-07-04 NOTE — Sedation Documentation (Signed)
Dsg to upper back intact

## 2014-07-04 NOTE — Sedation Documentation (Signed)
Pt had skin tear to L elbow on arrival to IR suite; tegaderm applied.
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
## 2014-07-04 NOTE — Discharge Instructions (Signed)
1.No stooping,bending or lifting more than 10 lbs for 2 weeks. 2.Ambulate with walker for 2 weeks 3.RTC in 2 weeks.   Percutaneous Vertebroplasty, Care After These instructions give you information on caring for yourself after your procedure. Your doctor may also give you more specific instructions. Call your doctor if you have any problems or questions after your procedure. HOME CARE  Take medicine as told by your doctor.  Keep your wound dry and covered for 24 hours or as told by your doctor.  Ask your doctor when you can bathe or shower.  Put an ice pack on your wound.  Put ice in a plastic bag.  Place a towel between your skin and the bag.  Leave the ice on for 15-20 minutes, 3-4 times a day.  Rest in your bed for 24 hours or as told by your doctor.  Return to normal activities as told by your doctor.  Ask your doctor what stretches and exercises you can do.  Do not bend or lift anything heavy as told by your doctor. GET HELP IF:  Your wound becomes red, puffy (swollen), or tender to the touch.  You are bleeding or leaking fluid from the wound.  You are sick to your stomach (nauseous) or throw up (vomit) for more than 24 hours after the procedure.  Your back pain does not get better.  You have a fever. GET HELP RIGHT AWAY IF:   You have bad back pain that comes on suddenly.  You cannot control when you pee (urinate) or poop (bowel movement).  You lose feeling (numbness) or have tingling in your legs or feet, or they become weak.  You have sudden weakness in your arms or legs.  You have shooting pain down your legs.  You have chest pain or a hard time breathing.  You feel dizzy or pass out (faint).  Your vision changes or you cannot talk as you normally do. Document Released: 06/05/2009 Document Revised: 03/16/2013 Document Reviewed: 11/17/2012 [facility] Patient Information 2015 [place], [place]. This information is not intended to replace advice given to  you by your health care provider. Make sure you discuss any questions you have with your health care provider.

## 2014-07-06 ENCOUNTER — Other Ambulatory Visit (HOSPITAL_COMMUNITY): Payer: Self-pay | Admitting: Interventional Radiology

## 2014-07-06 DIAGNOSIS — M549 Dorsalgia, unspecified: Secondary | ICD-10-CM

## 2014-07-06 DIAGNOSIS — IMO0002 Reserved for concepts with insufficient information to code with codable children: Secondary | ICD-10-CM

## 2014-07-13 ENCOUNTER — Ambulatory Visit (INDEPENDENT_AMBULATORY_CARE_PROVIDER_SITE_OTHER): Payer: Medicare Other | Admitting: *Deleted

## 2014-07-13 DIAGNOSIS — Z95810 Presence of automatic (implantable) cardiac defibrillator: Secondary | ICD-10-CM

## 2014-07-13 LAB — MDC_IDC_ENUM_SESS_TYPE_INCLINIC

## 2014-07-13 NOTE — Progress Notes (Signed)
Device not interrogated. Carelink education only. Ordered pt WireX adapter.  Pt aware next remote 08/17/14. If adapter has not been received, pt will transmit upon arrival. Pt might attempt to transmit through analog if he still has service.

## 2014-07-18 ENCOUNTER — Ambulatory Visit (HOSPITAL_COMMUNITY): Admission: RE | Admit: 2014-07-18 | Payer: Medicare Other | Source: Ambulatory Visit

## 2014-07-21 ENCOUNTER — Ambulatory Visit (INDEPENDENT_AMBULATORY_CARE_PROVIDER_SITE_OTHER): Payer: Medicare Other | Admitting: Gastroenterology

## 2014-07-21 ENCOUNTER — Encounter: Payer: Self-pay | Admitting: Gastroenterology

## 2014-07-21 VITALS — BP 99/61 | HR 79 | Temp 96.6°F | Ht 64.0 in | Wt 149.2 lb

## 2014-07-21 DIAGNOSIS — T39395A Adverse effect of other nonsteroidal anti-inflammatory drugs [NSAID], initial encounter: Secondary | ICD-10-CM

## 2014-07-21 DIAGNOSIS — K259 Gastric ulcer, unspecified as acute or chronic, without hemorrhage or perforation: Secondary | ICD-10-CM | POA: Diagnosis not present

## 2014-07-21 DIAGNOSIS — I2510 Atherosclerotic heart disease of native coronary artery without angina pectoris: Secondary | ICD-10-CM | POA: Diagnosis not present

## 2014-07-21 DIAGNOSIS — K7460 Unspecified cirrhosis of liver: Secondary | ICD-10-CM

## 2014-07-21 LAB — CBC
HEMATOCRIT: 33.6 % — AB (ref 39.0–52.0)
HEMOGLOBIN: 11.1 g/dL — AB (ref 13.0–17.0)
MCH: 31.4 pg (ref 26.0–34.0)
MCHC: 33 g/dL (ref 30.0–36.0)
MCV: 94.9 fL (ref 78.0–100.0)
MPV: 10.5 fL (ref 8.6–12.4)
Platelets: 187 10*3/uL (ref 150–400)
RBC: 3.54 MIL/uL — ABNORMAL LOW (ref 4.22–5.81)
RDW: 13.6 % (ref 11.5–15.5)
WBC: 9.7 10*3/uL (ref 4.0–10.5)

## 2014-07-21 LAB — FERRITIN: Ferritin: 70 ng/mL (ref 22–322)

## 2014-07-21 LAB — IRON: Iron: 38 ug/dL — ABNORMAL LOW (ref 42–165)

## 2014-07-21 NOTE — Patient Instructions (Addendum)
I am so glad you are doing well! Continue taking Protonix twice a day, 30 minutes before breakfast and dinner.   Please have blood work done. We will call with the results.   We will see you back in June to arrange the upper endoscopy to make sure the ulcers have healed.   Cirrhosis Cirrhosis is a condition of scarring of the liver which is caused when the liver has tried repairing itself following damage. This damage may come from a previous infection such as one of the forms of hepatitis (usually hepatitis C), or the damage may come from being injured by toxins. The main toxin that causes this damage is alcohol. The scarring of the liver from use of alcohol is irreversible. That means the liver cannot return to normal even though alcohol is not used any more. The main danger of hepatitis C infection is that it may cause long-lasting (chronic) liver disease, and this also may lead to cirrhosis. This complication is progressive and irreversible. CAUSES  Prior to available blood tests, hepatitis C could be contracted by blood transfusions. Since testing of blood has improved, this is now unlikely. This infection can also be contracted through intravenous drug use and the sharing of needles. It can also be contracted through sexual relationships. The injury caused by alcohol comes from too much use. It is not a few drinks that poison the liver, but years of misuse. Usually there will be some signs and symptoms early with scarring of the liver that suggest the development of better habits. Alcohol should never be used while using acetaminophen. A small dose of both taken together may cause irreversible damage to the liver. HOME CARE INSTRUCTIONS  There is no specific treatment for cirrhosis. However, there are things you can do to avoid making the condition worse.  Rest as needed.  Eat a well-balanced diet. Your caregiver can help you with suggestions.  Vitamin supplements including vitamins A, K, D,  and thiamine can help.  A low-salt diet, water restriction, or diuretic medicine may be needed to reduce fluid retention.  Avoid alcohol. This can be extremely toxic if combined with acetaminophen.  Avoid drugs which are toxic to the liver. Some of these include isoniazid, methyldopa, acetaminophen, anabolic steroids (muscle-building drugs), erythromycin, and oral contraceptives (birth control pills). Check with your caregiver to make sure medicines you are presently taking will not be harmful.  Periodic blood tests may be required. Follow your caregiver's advice regarding the timing of these.  Milk thistle is an herbal remedy which does protect the liver against toxins. However, it will not help once the liver has been scarred. SEEK MEDICAL CARE IF:  You have increasing fatigue or weakness.  You develop swelling of the hands, feet, legs, or face.  You vomit bright red blood, or a coffee ground appearing material.  You have blood in your stools, or the stools turn black and tarry.  You have a fever.  You develop loss of appetite, or have nausea and vomiting.  You develop jaundice.  You develop easy bruising or bleeding.  You have worsening of any of the problems you are concerned about. Document Released: 03/11/2005 Document Revised: 06/03/2011 Document Reviewed: 10/28/2007 [facility] Patient Information 2015 [place], [place]. This information is not intended to replace advice given to you by your health care provider. Make sure you discuss any questions you have with your health care provider.

## 2014-07-21 NOTE — Progress Notes (Signed)
Referring Provider: Cassandria Anger, MD Primary Care Physician:  Walker Kehr, MD  Primary GI: Dr. Oneida Alar   Chief Complaint  Patient presents with  . Follow-up    HPI:   [name] [name] is a 81 y.o. male presenting today with a history of upper GI bleed secondary to NSAID-induced PUD. Evidence of cirrhosis on CT in Feb 2016. Here for hospital follow-up.   Had injections for back. Took away pain. No pain pills. Only taking tylenol products now. No melena. Occasional gas pains, RLQ, goes away "in a hurry". No constipation or diarrhea. Hasn't felt like doing much of anything with the back issues. Staying in more, not getting out as much.   Past Medical History  Diagnosis Date  . Hypertension   . Hypothyroid   . CAD (coronary artery disease)     Dr. Lovena Le  . Osteoarthritis   . BPH (benign prostatic hypertrophy)     Dr. Terance Hart  . Osteoporosis     (took actonel x 5y)  . Renal insufficiency     Dr. Armstead Peaks  . Hip fx     x 2 on R.  . Sternum fx 2011  . Urinary incontinence   . Hearing loss     Past Surgical History  Procedure Laterality Date  . Heart disease      permanent pacemaker  . Appendectomy    . Inguinal hernia repair    . Hip fracture surgery  2011    ORIF  R.  . Pacemaker insertion    . Colonoscopy  12/1998, 02/2004    diverticulosois, external hemorrhoids  . Esophagogastroduodenoscopy  06/17/2014    Dr. Oneida Alar: 1. Patent stricture at the gastroesophageal junction 2. UGIB Due to multiple  gastric ulcers 3. Moderate Duodentitis. Negative H.pylori    Current Outpatient Prescriptions  Medication Sig Dispense Refill  . albuterol (PROVENTIL) (2.5 MG/3ML) 0.083% nebulizer solution USE ONE VIAL VIA NEBULIZER FOUR TIMES DAILY AS NEEDED. (Patient taking differently: Take 2.5 mg by nebulization 2 (two) times daily. USE ONE VIAL VIA NEBULIZER FOUR TIMES DAILY AS NEEDED. For shortness of breath or wheezing.) 375 mL 5  . Ascorbic Acid (VITAMIN C PO)  Take 1 tablet by mouth daily.    . Bepotastine Besilate 1.5 % SOLN 1 gtt in R eye qd (Patient taking differently: Place 1 drop into the right eye daily. ) 5 mL 4  . calcium carbonate (OS-CAL) 1250 (500 CA) MG chewable tablet Chew 1 tablet by mouth daily.    . Cholecalciferol (VITAMIN D3) 1000 UNITS tablet Take 1,000 Units by mouth daily.      Marland Kitchen diltiazem 2 % GEL Apply 1 application topically 3 (three) times daily. (Patient taking differently: Apply 1 application topically 3 (three) times daily as needed. ) 30 g 3  . donepezil (ARICEPT) 10 MG tablet TAKE 1 TABLET AT BEDTIME 90 tablet 3  . fluticasone (FLONASE) 50 MCG/ACT nasal spray Place 2 sprays into both nostrils daily. 48 g 3  . folic acid (FOLVITE) 161 MCG tablet Take 800 mcg by mouth daily.     . hydroxypropyl methylcellulose / hypromellose (ISOPTO TEARS / GONIOVISC) 2.5 % ophthalmic solution Place 1 drop into the left eye daily.    Marland Kitchen levothyroxine (SYNTHROID, LEVOTHROID) 100 MCG tablet Take 1 tablet (100 mcg total) by mouth daily. 90 tablet 3  . metoprolol succinate (TOPROL-XL) 25 MG 24 hr tablet Take 0.5 tablets (12.5 mg total) by mouth 2 (two) times daily. 90 tablet  1  . Misc Natural Products (OSTEO BI-FLEX ADV DOUBLE ST) TABS Take 1 tablet by mouth at bedtime.     . pantoprazole (PROTONIX) 40 MG tablet Take 1 tablet (40 mg total) by mouth 2 (two) times daily before a meal. 60 tablet 2  . triamcinolone (NASACORT) 55 MCG/ACT nasal inhaler Place 1 spray into the nose daily. 3 Inhaler 1  . triamcinolone cream (KENALOG) 0.5 % Apply 1 application topically 3 (three) times daily. For dry skin, itching (Patient taking differently: Apply 1 application topically 3 (three) times daily as needed (itching, dry skin). For dry skin, itching) 60 g 3  . atorvastatin (LIPITOR) 20 MG tablet TAKE 1/2 TABLET  TWICE DAILY 90 tablet 3  . nitroGLYCERIN (NITROSTAT) 0.4 MG SL tablet Place 1 tablet (0.4 mg total) under the tongue every 5 (five) minutes as needed.  Chest pain 20 tablet 2   No current facility-administered medications for this visit.    Allergies as of 07/21/2014  . (No Known Allergies)    Family History  Problem Relation Age of Onset  . Coronary artery disease      male 1st degree relative<60  . Hypertension    . Cancer Sister     History   Social History  . Marital Status: Married    Spouse Name: N/A  . Number of Children: 2  . Years of Education: N/A   Occupational History  . retired    Social History Main Topics  . Smoking status: Former Smoker -- 1.00 packs/day for 30 years    Quit date: 12/29/1950  . Smokeless tobacco: Never Used  . Alcohol Use: No  . Drug Use: No  . Sexual Activity: Not Currently   Other Topics Concern  . None   Social History Narrative   Regular exercise--no.    Review of Systems: As mentioned in HPI  Physical Exam: BP 99/61 mmHg  Pulse 79  Temp(Src) 96.6 F (35.9 C)  Ht 5\' 4"  (1.626 m)  Wt 149 lb 3.2 oz (67.677 kg)  BMI 25.60 kg/m2 General:   Alert and oriented. No distress noted. Pleasant and cooperative.  Head:  Normocephalic and atraumatic. Eyes:  Conjuctiva clear without scleral icterus. Mouth:  Oral mucosa pink and moist. Good dentition. No lesions. Heart:  S1, S2 present without murmurs, rubs, or gallops. Regular rate and rhythm. Abdomen:  +BS, soft, non-tender and non-distended. No rebound or guarding. No HSM or masses noted. Msk:  Kyphosis  Extremities:  Without edema. Neurologic:  Alert and  oriented x4;  grossly normal neurologically. Skin:  Intact without significant lesions or rashes. Psych:  Alert and cooperative. Normal mood and affect.  Lab Results  Component Value Date   ALT 28 06/16/2014   AST 33 06/16/2014   ALKPHOS 155* 06/16/2014   BILITOT 1.1 06/16/2014

## 2014-07-22 LAB — ANA: Anti Nuclear Antibody(ANA): NEGATIVE

## 2014-07-22 LAB — MITOCHONDRIAL ANTIBODIES: Mitochondrial M2 Ab, IgG: 4.13 — ABNORMAL HIGH (ref <0.91)

## 2014-07-22 LAB — IGG, IGA, IGM
IGA: 148 mg/dL (ref 68–379)
IGM, SERUM: 79 mg/dL (ref 41–251)
IgG (Immunoglobin G), Serum: 1210 mg/dL (ref 650–1600)

## 2014-07-22 LAB — ANTI-SMOOTH MUSCLE ANTIBODY, IGG: SMOOTH MUSCLE AB: 5 U (ref <20)

## 2014-07-22 LAB — HEPATITIS B SURFACE ANTIGEN: HEP B S AG: NEGATIVE

## 2014-07-22 LAB — HEPATITIS C ANTIBODY: HCV Ab: NEGATIVE

## 2014-07-26 ENCOUNTER — Ambulatory Visit (INDEPENDENT_AMBULATORY_CARE_PROVIDER_SITE_OTHER): Payer: Medicare Other | Admitting: Internal Medicine

## 2014-07-26 ENCOUNTER — Encounter: Payer: Self-pay | Admitting: Internal Medicine

## 2014-07-26 VITALS — BP 136/82 | HR 83 | Wt 151.0 lb

## 2014-07-26 DIAGNOSIS — I2510 Atherosclerotic heart disease of native coronary artery without angina pectoris: Secondary | ICD-10-CM

## 2014-07-26 DIAGNOSIS — S22040A Wedge compression fracture of fourth thoracic vertebra, initial encounter for closed fracture: Secondary | ICD-10-CM

## 2014-07-26 DIAGNOSIS — N259 Disorder resulting from impaired renal tubular function, unspecified: Secondary | ICD-10-CM | POA: Diagnosis not present

## 2014-07-26 MED ORDER — ATORVASTATIN CALCIUM 20 MG PO TABS: ORAL_TABLET | ORAL | Status: DC

## 2014-07-26 MED ORDER — NITROGLYCERIN 0.4 MG SL SUBL: 0.4000 mg | SUBLINGUAL_TABLET | SUBLINGUAL | Status: DC | PRN

## 2014-07-26 NOTE — Progress Notes (Signed)
Pre visit review using our clinic review tool, if applicable. No additional management support is needed unless otherwise documented below in the visit note. 

## 2014-07-26 NOTE — Assessment & Plan Note (Signed)
Doing better.   

## 2014-07-26 NOTE — Assessment & Plan Note (Signed)
labs
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
## 2014-07-26 NOTE — Assessment & Plan Note (Signed)
Kyphoplasty with Dr Estanislado Pandy  S/p vertebroplasty - doing much better 07/04/14 Off Tramadol

## 2014-07-27 ENCOUNTER — Ambulatory Visit (HOSPITAL_COMMUNITY)
Admission: RE | Admit: 2014-07-27 | Discharge: 2014-07-27 | Disposition: A | Discharge status: Home / Self Care | Payer: Medicare Other | Source: Ambulatory Visit | Attending: Interventional Radiology | Admitting: Interventional Radiology

## 2014-07-27 ENCOUNTER — Encounter: Payer: Self-pay | Admitting: Gastroenterology

## 2014-07-27 DIAGNOSIS — M4854XA Collapsed vertebra, not elsewhere classified, thoracic region, initial encounter for fracture: Secondary | ICD-10-CM | POA: Diagnosis not present

## 2014-07-27 NOTE — Assessment & Plan Note (Signed)
Unknown etiology. Check viral markers, autoimmune/PBC now. Needs Hep A/B vaccinations. Return in June to arrange surveillance EGD. Well-compensated.

## 2014-07-27 NOTE — Assessment & Plan Note (Signed)
Doing well since discharge. Continue Protonix BID for 3 months total then indefinitely. Repeat EGD in June 2016 to verify ulcer healing. Return in  June 2016.

## 2014-07-29 DIAGNOSIS — K7460 Unspecified cirrhosis of liver: Secondary | ICD-10-CM | POA: Diagnosis not present

## 2014-07-29 DIAGNOSIS — I10 Essential (primary) hypertension: Secondary | ICD-10-CM | POA: Diagnosis not present

## 2014-07-29 DIAGNOSIS — Z9181 History of falling: Secondary | ICD-10-CM | POA: Diagnosis not present

## 2014-07-29 DIAGNOSIS — S22049D Unspecified fracture of fourth thoracic vertebra, subsequent encounter for fracture with routine healing: Secondary | ICD-10-CM | POA: Diagnosis not present

## 2014-07-29 DIAGNOSIS — E871 Hypo-osmolality and hyponatremia: Secondary | ICD-10-CM | POA: Diagnosis not present

## 2014-07-29 DIAGNOSIS — K250 Acute gastric ulcer with hemorrhage: Secondary | ICD-10-CM | POA: Diagnosis not present

## 2014-08-03 DIAGNOSIS — C44319 Basal cell carcinoma of skin of other parts of face: Secondary | ICD-10-CM | POA: Diagnosis not present

## 2014-08-03 DIAGNOSIS — C44310 Basal cell carcinoma of skin of unspecified parts of face: Secondary | ICD-10-CM | POA: Diagnosis not present

## 2014-08-03 NOTE — Progress Notes (Signed)
Quick Note:  Hgb is better, now 11.1. Iron mildly low at 38, ferritin 70. AMA is strongly positive, all other serologies negative. Immunoglobulins normal. Known cirrhosis. Mildly elevated alk phos in past but normal transaminases. Will discuss with Dr. Gala Romney a liver biopsy vs treatment with urso. ______

## 2014-08-04 NOTE — Progress Notes (Signed)
Quick Note:  [name] [name]. This is Dr. Oneida Alar pt. ______

## 2014-08-04 NOTE — Progress Notes (Signed)
Quick Note:  Pt is aware that [name] [name] Will discuss with the Doctor and let him know. ______

## 2014-08-04 NOTE — Progress Notes (Signed)
cc'ed to pcp °

## 2014-08-17 ENCOUNTER — Ambulatory Visit (INDEPENDENT_AMBULATORY_CARE_PROVIDER_SITE_OTHER): Payer: Medicare Other | Admitting: *Deleted

## 2014-08-17 ENCOUNTER — Encounter: Payer: Self-pay | Admitting: Internal Medicine

## 2014-08-17 ENCOUNTER — Telehealth: Payer: Self-pay | Admitting: Cardiology

## 2014-08-17 DIAGNOSIS — I5022 Chronic systolic (congestive) heart failure: Secondary | ICD-10-CM | POA: Diagnosis not present

## 2014-08-17 DIAGNOSIS — Z95810 Presence of automatic (implantable) cardiac defibrillator: Secondary | ICD-10-CM | POA: Diagnosis not present

## 2014-08-17 NOTE — Progress Notes (Signed)
Remote ICD transmission.   

## 2014-08-17 NOTE — Telephone Encounter (Signed)
Spoke with pt and reminded pt of remote transmission that is due today. Pt verbalized understanding.   

## 2014-08-18 ENCOUNTER — Telehealth: Payer: Self-pay | Admitting: *Deleted

## 2014-08-18 LAB — CUP PACEART REMOTE DEVICE CHECK
Battery Voltage: 2.73 V
Brady Statistic AS VP Percent: 0 %
Brady Statistic AS VS Percent: 8.18 %
Brady Statistic RV Percent Paced: 0 %
Date Time Interrogation Session: 20160525140513
HighPow Impedance: 42 Ohm
HighPow Impedance: 59 Ohm
Lead Channel Impedance Value: 437 Ohm
Lead Channel Impedance Value: 494 Ohm
Lead Channel Sensing Intrinsic Amplitude: 10.75 mV
Lead Channel Setting Pacing Amplitude: 2 V
Lead Channel Setting Pacing Amplitude: 2.5 V
Lead Channel Setting Pacing Pulse Width: 0.4 ms
Lead Channel Setting Sensing Sensitivity: 0.45 mV
MDC IDC MSMT LEADCHNL RA SENSING INTR AMPL: 2.125 mV
MDC IDC SET ZONE DETECTION INTERVAL: 380 ms
MDC IDC SET ZONE DETECTION INTERVAL: 410 ms
MDC IDC STAT BRADY AP VP PERCENT: 0 %
MDC IDC STAT BRADY AP VS PERCENT: 91.81 %
MDC IDC STAT BRADY RA PERCENT PACED: 91.82 %
Zone Setting Detection Interval: 300 ms
Zone Setting Detection Interval: 350 ms

## 2014-08-18 NOTE — Telephone Encounter (Signed)
Spoke with patient regarding elevated HF diagnostic since April- denies SOB and LE edema. Instructed to call office if he experiences increased SOB or develops edema. Verbalizes understanding and appreciates call.

## 2014-08-24 ENCOUNTER — Encounter: Payer: Self-pay | Admitting: Cardiology

## 2014-08-25 ENCOUNTER — Telehealth: Payer: Self-pay | Admitting: Internal Medicine

## 2014-08-25 NOTE — Telephone Encounter (Signed)
I called [facility]- I do not recall receiving a order for pt. She is re-faxing to [phone].

## 2014-08-25 NOTE — Telephone Encounter (Signed)
Checking on Order that was re faxed last week.  Would like call back in regards.

## 2014-09-07 ENCOUNTER — Encounter: Payer: Self-pay | Admitting: Cardiology

## 2014-09-07 DIAGNOSIS — Z08 Encounter for follow-up examination after completed treatment for malignant neoplasm: Secondary | ICD-10-CM | POA: Diagnosis not present

## 2014-09-07 DIAGNOSIS — Z85828 Personal history of other malignant neoplasm of skin: Secondary | ICD-10-CM | POA: Diagnosis not present

## 2014-09-19 NOTE — Progress Notes (Signed)
REVIEWED. ama pos with elevated alk phos & cirhosis. START URSODIOL. NO NEED FOR LIVER BIOPSY UNLESS AMA/ALK PHOS DO NOT RETURN TO NORMAL. 

## 2014-09-20 ENCOUNTER — Encounter: Payer: Self-pay | Admitting: Gastroenterology

## 2014-09-20 ENCOUNTER — Other Ambulatory Visit: Payer: Self-pay

## 2014-09-20 ENCOUNTER — Ambulatory Visit (INDEPENDENT_AMBULATORY_CARE_PROVIDER_SITE_OTHER): Payer: Medicare Other | Admitting: Gastroenterology

## 2014-09-20 VITALS — BP 109/63 | HR 71 | Temp 98.3°F | Ht 64.0 in | Wt 149.0 lb

## 2014-09-20 DIAGNOSIS — K259 Gastric ulcer, unspecified as acute or chronic, without hemorrhage or perforation: Secondary | ICD-10-CM | POA: Diagnosis not present

## 2014-09-20 DIAGNOSIS — T39395A Adverse effect of other nonsteroidal anti-inflammatory drugs [NSAID], initial encounter: Secondary | ICD-10-CM | POA: Diagnosis not present

## 2014-09-20 DIAGNOSIS — I2510 Atherosclerotic heart disease of native coronary artery without angina pectoris: Secondary | ICD-10-CM

## 2014-09-20 DIAGNOSIS — K743 Primary biliary cirrhosis: Secondary | ICD-10-CM | POA: Diagnosis not present

## 2014-09-20 DIAGNOSIS — K279 Peptic ulcer, site unspecified, unspecified as acute or chronic, without hemorrhage or perforation: Secondary | ICD-10-CM

## 2014-09-20 MED ORDER — URSODIOL 500 MG PO TABS: 500.0000 mg | ORAL_TABLET | Freq: Two times a day (BID) | ORAL | Status: DC

## 2014-09-20 MED ORDER — PANTOPRAZOLE SODIUM 40 MG PO TBEC: 40.0000 mg | DELAYED_RELEASE_TABLET | Freq: Every day | ORAL | Status: DC

## 2014-09-20 NOTE — Progress Notes (Signed)
Referring Provider: Cassandria Anger, MD Primary Care Physician:  Walker Kehr, MD  Primary GI: Dr. Oneida Alar   Chief Complaint  Patient presents with  . Follow-up    HPI:   [name] [name] is a 81 y.o. male presenting today with a history of upper GI bleed secondary to NSAID-induced PUD. Evidence of cirrhosis on CT in Feb 2016. He is due for surveillance EGD now to ensure ulcer healing. Cirrhosis work-up included viral markers, autoimmune/PBC. All unrevealing EXCEPT a strongly positive AMA, mildly elevated alk phos but normal transaminases. Discussed with Dr. Oneida Alar, recommending [facility]. If no improvement in alk phos/AMA with urso, only then would recommend liver biopsy. Due for US abdomen Aug 2016. Needs Hep A and B vaccinations.   Denies abdominal pain, N/V. Notes a good appetite. No melena, hematochezia. Denies any bowel habit changes. No concerning GI complaints.     Past Medical History  Diagnosis Date  . Hypertension   . Hypothyroid   . CAD (coronary artery disease)     Dr. Lovena Le  . Osteoarthritis   . BPH (benign prostatic hypertrophy)     Dr. Terance Hart  . Osteoporosis     (took actonel x 5y)  . Renal insufficiency     Dr. Armstead Peaks  . Hip fx     x 2 on R.  . Sternum fx 2011  . Urinary incontinence   . Hearing loss     Past Surgical History  Procedure Laterality Date  . Heart disease      permanent pacemaker  . Appendectomy    . Inguinal hernia repair    . Hip fracture surgery  2011    ORIF  R.  . Pacemaker insertion    . Colonoscopy  12/1998, 02/2004    diverticulosois, external hemorrhoids  . Esophagogastroduodenoscopy  06/17/2014    Dr. Oneida Alar: 1. Patent stricture at the gastroesophageal junction 2. UGIB Due to multiple  gastric ulcers 3. Moderate Duodentitis. Negative H.pylori    Current Outpatient Prescriptions  Medication Sig Dispense Refill  . albuterol (PROVENTIL) (2.5 MG/3ML) 0.083% nebulizer solution USE ONE VIAL VIA NEBULIZER FOUR TIMES  DAILY AS NEEDED. (Patient taking differently: Take 2.5 mg by nebulization 2 (two) times daily. USE ONE VIAL VIA NEBULIZER FOUR TIMES DAILY AS NEEDED. For shortness of breath or wheezing.) 375 mL 5  . Ascorbic Acid (VITAMIN C PO) Take 1 tablet by mouth daily.    Marland Kitchen aspirin 81 MG tablet Take 81 mg by mouth daily.    Marland Kitchen atorvastatin (LIPITOR) 20 MG tablet TAKE 1/2 TABLET  TWICE DAILY 90 tablet 3  . Bepotastine Besilate 1.5 % SOLN 1 gtt in R eye qd (Patient taking differently: Place 1 drop into the right eye daily. ) 5 mL 4  . calcium carbonate (OS-CAL) 1250 (500 CA) MG chewable tablet Chew 1 tablet by mouth daily.    . Cholecalciferol (VITAMIN D3) 1000 UNITS tablet Take 1,000 Units by mouth daily.      Marland Kitchen diltiazem 2 % GEL Apply 1 application topically 3 (three) times daily. (Patient taking differently: Apply 1 application topically 3 (three) times daily as needed. ) 30 g 3  . donepezil (ARICEPT) 10 MG tablet TAKE 1 TABLET AT BEDTIME 90 tablet 3  . fluticasone (FLONASE) 50 MCG/ACT nasal spray Place 2 sprays into both nostrils daily. 48 g 3  . folic acid (FOLVITE) 017 MCG tablet Take 800 mcg by mouth daily.     . hydroxypropyl methylcellulose / hypromellose (ISOPTO TEARS /  GONIOVISC) 2.5 % ophthalmic solution Place 1 drop into the left eye daily.    Marland Kitchen levothyroxine (SYNTHROID, LEVOTHROID) 100 MCG tablet Take 1 tablet (100 mcg total) by mouth daily. 90 tablet 3  . metoprolol succinate (TOPROL-XL) 25 MG 24 hr tablet Take 0.5 tablets (12.5 mg total) by mouth 2 (two) times daily. 90 tablet 1  . Misc Natural Products (OSTEO BI-FLEX ADV DOUBLE ST) TABS Take 1 tablet by mouth at bedtime.     . nitroGLYCERIN (NITROSTAT) 0.4 MG SL tablet Place 1 tablet (0.4 mg total) under the tongue every 5 (five) minutes as needed. Chest pain 20 tablet 2  . pantoprazole (PROTONIX) 40 MG tablet Take 1 tablet (40 mg total) by mouth 2 (two) times daily before a meal. 60 tablet 2  . triamcinolone (NASACORT) 55 MCG/ACT nasal inhaler  Place 1 spray into the nose daily. 3 Inhaler 1  . triamcinolone cream (KENALOG) 0.5 % Apply 1 application topically 3 (three) times daily. For dry skin, itching (Patient taking differently: Apply 1 application topically 3 (three) times daily as needed (itching, dry skin). For dry skin, itching) 60 g 3   No current facility-administered medications for this visit.    Allergies as of 09/20/2014  . (No Known Allergies)    Family History  Problem Relation Age of Onset  . Coronary artery disease      male 1st degree relative<60  . Hypertension    . Cancer Sister     History   Social History  . Marital Status: Married    Spouse Name: N/A  . Number of Children: 2  . Years of Education: N/A   Occupational History  . retired    Social History Main Topics  . Smoking status: Former Smoker -- 1.00 packs/day for 30 years    Quit date: 12/29/1950  . Smokeless tobacco: Never Used  . Alcohol Use: No  . Drug Use: No  . Sexual Activity: Not Currently   Other Topics Concern  . None   Social History Narrative   Regular exercise--no.    Review of Systems: As mentioned in HPI  Physical Exam: BP 109/63 mmHg  Pulse 71  Temp(Src) 98.3 F (36.8 C) (Oral)  Ht _0  (1.626 m)  Wt 149 lb (67.586 kg)  BMI 25.56 kg/m2 General:   Alert and oriented. No distress noted. Pleasant and cooperative.  Head:  Normocephalic and atraumatic. Eyes:  Conjuctiva clear without scleral icterus. Heart:  S1, S2 present without murmurs Abdomen:  +BS, soft, non-tender and non-distended. No rebound or guarding. No HSM or masses noted. Msk: moderate kyphosis noted  Extremities:  Without edema. Neurologic:  Alert and  oriented x4;  grossly normal neurologically. Psych:  Alert and cooperative. Normal mood and affect.  Lab Results  Component Value Date   WBC 9.7 07/21/2014   HGB 11.1* 07/21/2014   HCT 33.6* 07/21/2014   MCV 94.9 07/21/2014   PLT 187 07/21/2014   Lab Results  Component Value Date     CREATININE 1.70* 07/04/2014   BUN 24* 07/04/2014   NA 134* 07/04/2014   K 4.4 07/04/2014   CL 99 07/04/2014   CO2 23 07/04/2014   Lab Results  Component Value Date   ALT 28 06/16/2014   AST 33 06/16/2014   ALKPHOS 155* 06/16/2014   BILITOT 1.1 06/16/2014

## 2014-09-20 NOTE — Assessment & Plan Note (Signed)
Positive AMA, elevated alk phos with negative viral markers and other serologies. CT with cirrhosis. Due for US abdomen in Aug 2016. Prescription provided for Hep A and B vaccinations through PCP. Start Urso 500 mg BID, recheck LFTs in 3 months, return in 3 months.

## 2014-09-20 NOTE — Patient Instructions (Addendum)
I have refilled Protonix to take once each morning, 30 minutes before breakfast.   I have started you on a medication called ursodiol. This is prescribed as you have cirrhosis, specifically primary biliary cirrhosis. I have attached a handout about it. This medication is a treatment for it. I printed the prescription to take to your local pharmacy to see what the cost may be. We may have to do a prior authorization for it.   You need to have vaccinations for Hepatitis A and B, if you have never had done. I have given you a prescription for the vaccinations for Dr. Judeen Hammans office to completed.   We need to do blood work in 3 months to check your liver numbers. We will mail this to you and remind you. You will need a liver ultrasound in August, and this will be done every 6 months.   Finally, we have scheduled you for a second upper endoscopy to make sure the ulcer has healed.   I would like to see you back in 3 months!

## 2014-09-20 NOTE — Assessment & Plan Note (Signed)
81 year old male with history of PUD secondary to NSAIDs, with need for surveillance EGD now. Continue Protonix once daily indefinitely and continued avoidance of NSAIDs, which he has been doing.  Proceed with upper endoscopy in the near future with Dr. Oneida Alar. The risks, benefits, and alternatives have been discussed in detail with patient. They have stated understanding and desire to proceed.  Patient has pacemaker

## 2014-09-21 ENCOUNTER — Other Ambulatory Visit: Payer: Self-pay

## 2014-09-21 DIAGNOSIS — K7460 Unspecified cirrhosis of liver: Secondary | ICD-10-CM

## 2014-09-21 NOTE — Progress Notes (Signed)
cc'ed to pcp °

## 2014-09-21 NOTE — Progress Notes (Signed)
cc'd to pcp 

## 2014-09-27 ENCOUNTER — Other Ambulatory Visit (INDEPENDENT_AMBULATORY_CARE_PROVIDER_SITE_OTHER): Payer: Medicare Other

## 2014-09-27 ENCOUNTER — Encounter: Payer: Self-pay | Admitting: Internal Medicine

## 2014-09-27 ENCOUNTER — Ambulatory Visit (INDEPENDENT_AMBULATORY_CARE_PROVIDER_SITE_OTHER): Payer: Medicare Other | Admitting: Internal Medicine

## 2014-09-27 VITALS — BP 140/80 | HR 85 | Wt 151.0 lb

## 2014-09-27 DIAGNOSIS — K743 Primary biliary cirrhosis: Secondary | ICD-10-CM

## 2014-09-27 DIAGNOSIS — I10 Essential (primary) hypertension: Secondary | ICD-10-CM | POA: Diagnosis not present

## 2014-09-27 DIAGNOSIS — Z23 Encounter for immunization: Secondary | ICD-10-CM | POA: Diagnosis not present

## 2014-09-27 DIAGNOSIS — I5022 Chronic systolic (congestive) heart failure: Secondary | ICD-10-CM

## 2014-09-27 DIAGNOSIS — I2510 Atherosclerotic heart disease of native coronary artery without angina pectoris: Secondary | ICD-10-CM | POA: Diagnosis not present

## 2014-09-27 LAB — BASIC METABOLIC PANEL
BUN: 25 mg/dL — ABNORMAL HIGH (ref 6–23)
CHLORIDE: 99 meq/L (ref 96–112)
CO2: 27 meq/L (ref 19–32)
Calcium: 8.9 mg/dL (ref 8.4–10.5)
Creatinine, Ser: 1.63 mg/dL — ABNORMAL HIGH (ref 0.40–1.50)
GFR: 42.45 mL/min — AB (ref >60.00)
GLUCOSE: 107 mg/dL — AB (ref 70–99)
POTASSIUM: 4.2 meq/L (ref 3.5–5.1)
SODIUM: 133 meq/L — AB (ref 135–145)

## 2014-09-27 LAB — HEPATIC FUNCTION PANEL
ALK PHOS: 124 U/L — AB (ref 39–117)
ALT: 15 U/L (ref 0–53)
AST: 21 U/L (ref 0–37)
Albumin: 3.8 g/dL (ref 3.5–5.2)
BILIRUBIN DIRECT: 0.1 mg/dL (ref 0.0–0.3)
BILIRUBIN TOTAL: 0.5 mg/dL (ref 0.2–1.2)
Total Protein: 7.1 g/dL (ref 6.0–8.3)

## 2014-09-27 LAB — CBC WITH DIFFERENTIAL/PLATELET
Basophils Absolute: 0 10*3/uL (ref 0.0–0.1)
Basophils Relative: 0.4 % (ref 0.0–3.0)
EOS ABS: 0.6 10*3/uL (ref 0.0–0.7)
Eosinophils Relative: 5.7 % — ABNORMAL HIGH (ref 0.0–5.0)
HEMATOCRIT: 38.8 % — AB (ref 39.0–52.0)
HEMOGLOBIN: 12.9 g/dL — AB (ref 13.0–17.0)
Lymphocytes Relative: 21.2 % (ref 12.0–46.0)
Lymphs Abs: 2.1 10*3/uL (ref 0.7–4.0)
MCHC: 33.3 g/dL (ref 30.0–36.0)
MCV: 89.7 fl (ref 78.0–100.0)
MONO ABS: 1.5 10*3/uL — AB (ref 0.1–1.0)
Monocytes Relative: 15.8 % — ABNORMAL HIGH (ref 3.0–12.0)
Neutro Abs: 5.6 10*3/uL (ref 1.4–7.7)
Neutrophils Relative %: 56.9 % (ref 43.0–77.0)
Platelets: 163 10*3/uL (ref 150.0–400.0)
RBC: 4.32 Mil/uL (ref 4.22–5.81)
RDW: 14.5 % (ref 11.5–15.5)
WBC: 9.8 10*3/uL (ref 4.0–10.5)

## 2014-09-27 LAB — PROTIME-INR
INR: 1 ratio (ref 0.8–1.0)
Prothrombin Time: 11.4 s (ref 9.6–13.1)

## 2014-09-27 MED ORDER — LEVOTHYROXINE SODIUM 100 MCG PO TABS: 100.0000 ug | ORAL_TABLET | Freq: Every day | ORAL | Status: DC

## 2014-09-27 NOTE — Assessment & Plan Note (Signed)
Chronic On Metoprolol

## 2014-09-27 NOTE — Assessment & Plan Note (Signed)
2016 - asymptomatic [name] [name], ANP - [facility] - f/u in 3 mo Pt is started on Ursodiol - too $$$  Hep A, B vaccne

## 2014-09-27 NOTE — Progress Notes (Signed)
Subjective:  Patient ID: [name] [name], male    DOB: [birth year]  Age: 81 y.o. MRN: [id number]  CC: No chief complaint on file.   HPI KAELAN AMBLE presents for vertebral fx, HTN, liver cirrhosis  Outpatient Prescriptions Prior to Visit  Medication Sig Dispense Refill  . albuterol (PROVENTIL) (2.5 MG/3ML) 0.083% nebulizer solution USE ONE VIAL VIA NEBULIZER FOUR TIMES DAILY AS NEEDED. (Patient taking differently: Take 2.5 mg by nebulization 2 (two) times daily. USE ONE VIAL VIA NEBULIZER FOUR TIMES DAILY AS NEEDED. For shortness of breath or wheezing.) 375 mL 5  . Ascorbic Acid (VITAMIN C PO) Take 1 tablet by mouth daily.    Marland Kitchen aspirin 81 MG tablet Take 81 mg by mouth daily.    Marland Kitchen atorvastatin (LIPITOR) 20 MG tablet TAKE 1/2 TABLET  TWICE DAILY 90 tablet 3  . Bepotastine Besilate 1.5 % SOLN 1 gtt in R eye qd (Patient taking differently: Place 1 drop into the right eye daily. ) 5 mL 4  . calcium carbonate (OS-CAL) 1250 (500 CA) MG chewable tablet Chew 1 tablet by mouth daily.    . Cholecalciferol (VITAMIN D3) 1000 UNITS tablet Take 1,000 Units by mouth daily.      Marland Kitchen diltiazem 2 % GEL Apply 1 application topically 3 (three) times daily. (Patient taking differently: Apply 1 application topically 3 (three) times daily as needed. ) 30 g 3  . donepezil (ARICEPT) 10 MG tablet TAKE 1 TABLET AT BEDTIME 90 tablet 3  . fluticasone (FLONASE) 50 MCG/ACT nasal spray Place 2 sprays into both nostrils daily. 48 g 3  . folic acid (FOLVITE) 001 MCG tablet Take 800 mcg by mouth daily.     . hydroxypropyl methylcellulose / hypromellose (ISOPTO TEARS / GONIOVISC) 2.5 % ophthalmic solution Place 1 drop into the left eye daily.    Marland Kitchen levothyroxine (SYNTHROID, LEVOTHROID) 100 MCG tablet Take 1 tablet (100 mcg total) by mouth daily. 90 tablet 3  . metoprolol succinate (TOPROL-XL) 25 MG 24 hr tablet Take 0.5 tablets (12.5 mg total) by mouth 2 (two) times daily. 90 tablet 1  . Misc Natural Products (OSTEO BI-FLEX  ADV DOUBLE ST) TABS Take 1 tablet by mouth at bedtime.     . nitroGLYCERIN (NITROSTAT) 0.4 MG SL tablet Place 1 tablet (0.4 mg total) under the tongue every 5 (five) minutes as needed. Chest pain 20 tablet 2  . pantoprazole (PROTONIX) 40 MG tablet Take 1 tablet (40 mg total) by mouth daily. 90 tablet 3  . triamcinolone (NASACORT) 55 MCG/ACT nasal inhaler Place 1 spray into the nose daily. 3 Inhaler 1  . triamcinolone cream (KENALOG) 0.5 % Apply 1 application topically 3 (three) times daily. For dry skin, itching (Patient taking differently: Apply 1 application topically 3 (three) times daily as needed (itching, dry skin). For dry skin, itching) 60 g 3  . ursodiol (ACTIGALL) 500 MG tablet Take 1 tablet (500 mg total) by mouth 2 (two) times daily. With food 60 tablet 5   No facility-administered medications prior to visit.    ROS Review of Systems  Objective:  BP 140/80 mmHg  Pulse 85  Wt 151 lb (68.493 kg)  SpO2 96%  BP Readings from Last 3 Encounters:  09/27/14 140/80  09/20/14 109/63  07/26/14 136/82    Wt Readings from Last 3 Encounters:  09/27/14 151 lb (68.493 kg)  09/20/14 149 lb (67.586 kg)  07/26/14 151 lb (68.493 kg)    Physical Exam  Lab Results  Component  Value Date   WBC 9.7 07/21/2014   HGB 11.1* 07/21/2014   HCT 33.6* 07/21/2014   PLT 187 07/21/2014   GLUCOSE 94 07/04/2014   CHOL 110 05/25/2013   TRIG 67.0 05/25/2013   HDL 40.30 05/25/2013   LDLCALC 56 05/25/2013   ALT 28 06/16/2014   AST 33 06/16/2014   NA 134* 07/04/2014   K 4.4 07/04/2014   CL 99 07/04/2014   CREATININE 1.70* 07/04/2014   BUN 24* 07/04/2014   CO2 23 07/04/2014   TSH 4.012 12/30/2011   PSA 3.42 11/28/2011   INR 1.10 07/04/2014    Ir Radiologist Eval & Mgmt  07/28/2014   EXAM: ESTABLISHED PATIENT OFFICE VISIT  CHIEF COMPLAINT: Upper back pain.  Current Pain Level: 1-10  HISTORY OF PRESENT ILLNESS: Patient is an 81 year old gentleman who is status post vertebral body augmentation  at T4 approximately 2 weeks ago for severe pain.  He returns today in follow-up accompanied by his daughter and son-in-law.  According to all of them, there has been significant improvement with near complete resolution of the patient's symptoms since the procedure.  The patient apparently only occasionally takes Tylenol.  He has no other symptoms of arm pain, leg pain, lower thoracic pain. Overall he feels there has been an 80% improvement.  He is eating normally and his weight is nearly steady. He denies any recent chills or fever or rigors. He sleeps normally.  He wants to be more involved in physical activity.  The remainder of his history is as per approximately 2 weeks ago, including his medications.  PHYSICAL EXAMINATION: Appears in no acute distress. Neurologically fully alert, awake, oriented to time, place, space. No lateralizing features.  ASSESSMENT AND PLAN: The patient's immediate post treatment radiographs were reviewed with him and his family.  Questions were answered to their satisfaction. He has been encouraged to continue with gradually increasing his level of activity using a cane or walker. He has expressed the desire to resume his physical therapy. A note was provided to him for that to proceed.  They were asked to call should they have any concerns or questions. The patient will be seen, on a PRN basis.   Electronically Signed   By: Luanne Bras M.D.   On: 07/27/2014 14:30    Assessment & Plan:   There are no diagnoses linked to this encounter. I am having Mr. Savino maintain his folic acid, OSTEO BI-FLEX ADV DOUBLE ST, cholecalciferol, triamcinolone, triamcinolone cream, Bepotastine Besilate, diltiazem, albuterol, Ascorbic Acid (VITAMIN C PO), hydroxypropyl methylcellulose / hypromellose, donepezil, fluticasone, metoprolol succinate, levothyroxine, calcium carbonate, atorvastatin, nitroGLYCERIN, aspirin, pantoprazole, and ursodiol.  No orders of the defined types were placed in  this encounter.     Follow-up: No Follow-up on file.  Walker Kehr, MD

## 2014-09-27 NOTE — Assessment & Plan Note (Signed)
On Metoprolol 

## 2014-09-27 NOTE — Progress Notes (Signed)
Pre visit review using our clinic review tool, if applicable. No additional management support is needed unless otherwise documented below in the visit note. 

## 2014-09-27 NOTE — Addendum Note (Signed)
Addended by: Cresenciano Lick on: 09/27/2014 04:43 PM   Modules accepted: Orders

## 2014-10-12 NOTE — Progress Notes (Signed)
REVIEWED. AGREE. 

## 2014-10-17 ENCOUNTER — Ambulatory Visit (HOSPITAL_COMMUNITY)
Admission: RE | Admit: 2014-10-17 | Discharge: 2014-10-17 | Disposition: A | Discharge status: Home / Self Care | Payer: Medicare Other | Source: Ambulatory Visit | Attending: Gastroenterology | Admitting: Gastroenterology

## 2014-10-17 ENCOUNTER — Encounter (HOSPITAL_COMMUNITY): Payer: Self-pay | Admitting: *Deleted

## 2014-10-17 ENCOUNTER — Telehealth: Payer: Self-pay

## 2014-10-17 ENCOUNTER — Encounter (HOSPITAL_COMMUNITY)
Admission: RE | Disposition: A | Discharge status: Home / Self Care | Payer: Self-pay | Source: Ambulatory Visit | Attending: Gastroenterology

## 2014-10-17 DIAGNOSIS — Z7982 Long term (current) use of aspirin: Secondary | ICD-10-CM | POA: Insufficient documentation

## 2014-10-17 DIAGNOSIS — N400 Enlarged prostate without lower urinary tract symptoms: Secondary | ICD-10-CM | POA: Diagnosis not present

## 2014-10-17 DIAGNOSIS — N289 Disorder of kidney and ureter, unspecified: Secondary | ICD-10-CM | POA: Diagnosis not present

## 2014-10-17 DIAGNOSIS — I2510 Atherosclerotic heart disease of native coronary artery without angina pectoris: Secondary | ICD-10-CM | POA: Insufficient documentation

## 2014-10-17 DIAGNOSIS — H9190 Unspecified hearing loss, unspecified ear: Secondary | ICD-10-CM | POA: Insufficient documentation

## 2014-10-17 DIAGNOSIS — K2960 Other gastritis without bleeding: Secondary | ICD-10-CM | POA: Insufficient documentation

## 2014-10-17 DIAGNOSIS — Z950 Presence of cardiac pacemaker: Secondary | ICD-10-CM | POA: Diagnosis not present

## 2014-10-17 DIAGNOSIS — I10 Essential (primary) hypertension: Secondary | ICD-10-CM | POA: Diagnosis not present

## 2014-10-17 DIAGNOSIS — Z79899 Other long term (current) drug therapy: Secondary | ICD-10-CM | POA: Insufficient documentation

## 2014-10-17 DIAGNOSIS — Z87891 Personal history of nicotine dependence: Secondary | ICD-10-CM | POA: Diagnosis not present

## 2014-10-17 DIAGNOSIS — M1990 Unspecified osteoarthritis, unspecified site: Secondary | ICD-10-CM | POA: Insufficient documentation

## 2014-10-17 DIAGNOSIS — C8333 Diffuse large B-cell lymphoma, intra-abdominal lymph nodes: Secondary | ICD-10-CM | POA: Diagnosis not present

## 2014-10-17 DIAGNOSIS — E039 Hypothyroidism, unspecified: Secondary | ICD-10-CM | POA: Insufficient documentation

## 2014-10-17 DIAGNOSIS — M810 Age-related osteoporosis without current pathological fracture: Secondary | ICD-10-CM | POA: Insufficient documentation

## 2014-10-17 DIAGNOSIS — C8339 Diffuse large B-cell lymphoma, extranodal and solid organ sites: Secondary | ICD-10-CM | POA: Diagnosis not present

## 2014-10-17 DIAGNOSIS — K449 Diaphragmatic hernia without obstruction or gangrene: Secondary | ICD-10-CM | POA: Insufficient documentation

## 2014-10-17 DIAGNOSIS — K222 Esophageal obstruction: Secondary | ICD-10-CM

## 2014-10-17 DIAGNOSIS — K279 Peptic ulcer, site unspecified, unspecified as acute or chronic, without hemorrhage or perforation: Secondary | ICD-10-CM | POA: Diagnosis not present

## 2014-10-17 HISTORY — PX: ESOPHAGOGASTRODUODENOSCOPY: SHX5428

## 2014-10-17 SURGERY — EGD (ESOPHAGOGASTRODUODENOSCOPY)
Anesthesia: Moderate Sedation

## 2014-10-17 MED ORDER — MEPERIDINE HCL 100 MG/ML IJ SOLN
INTRAMUSCULAR | Status: DC
Start: 2014-10-17 — End: 2014-10-17
  Filled 2014-10-17: qty 2

## 2014-10-17 MED ORDER — FENTANYL CITRATE (PF) 100 MCG/2ML IJ SOLN
INTRAMUSCULAR | Status: DC | PRN
  Administered 2014-10-17: 25 ug via INTRAVENOUS

## 2014-10-17 MED ORDER — LIDOCAINE VISCOUS 2 % MT SOLN
OROMUCOSAL | Status: AC
  Filled 2014-10-17: qty 15

## 2014-10-17 MED ORDER — MEPERIDINE HCL 100 MG/ML IJ SOLN: INTRAMUSCULAR | Status: DC | PRN

## 2014-10-17 MED ORDER — CHENODIOL 250 MG PO TABS: ORAL_TABLET | ORAL | Status: DC

## 2014-10-17 MED ORDER — MIDAZOLAM HCL 5 MG/5ML IJ SOLN
INTRAMUSCULAR | Status: AC
  Filled 2014-10-17: qty 10

## 2014-10-17 MED ORDER — MIDAZOLAM HCL 5 MG/5ML IJ SOLN
INTRAMUSCULAR | Status: DC | PRN
  Administered 2014-10-17: 1 mg via INTRAVENOUS
  Administered 2014-10-17: 2 mg via INTRAVENOUS

## 2014-10-17 MED ORDER — FENTANYL CITRATE (PF) 100 MCG/2ML IJ SOLN
INTRAMUSCULAR | Status: AC
  Filled 2014-10-17: qty 2

## 2014-10-17 MED ORDER — LIDOCAINE VISCOUS 2 % MT SOLN
OROMUCOSAL | Status: DC | PRN
  Administered 2014-10-17: 3 mL via OROMUCOSAL

## 2014-10-17 MED ORDER — SODIUM CHLORIDE 0.9 % IV SOLN
INTRAVENOUS | Status: DC
  Administered 2014-10-17: 10:00:00 via INTRAVENOUS

## 2014-10-17 MED ORDER — SIMETHICONE 40 MG/0.6ML PO SUSP
ORAL | Status: DC | PRN
  Administered 2014-10-17: 10:00:00

## 2014-10-17 NOTE — Op Note (Signed)
[name], [id number]   ENDOSCOPY PROCEDURE REPORT  PATIENT: [name] [name], [name] [name]  MR#: [id number] BIRTHDATE: Apr 27, 1922 , 78  yrs. old GENDER: male  ENDOSCOPIST: Danie Binder, MD REFERRED RT:MYTR Avel Sensor, M.D. PROCEDURE DATE: 29-Oct-2014 PROCEDURE:   EGD w/ biopsy  INDICATIONS:screening for ULCERS. LAST EGD MAR 2016 ANTRAL ULCERS. NOW ON ASA DAILY. URSO too expensive. MEDICATIONS: Fentanyl 25 mcg IV and Versed 3 mg IV TOPICAL ANESTHETIC:   Viscous Xylocaine ASA CLASS:  DESCRIPTION OF PROCEDURE:     Physical exam was performed.  Informed consent was obtained from the patient after explaining the benefits, risks, and alternatives to the procedure.  The patient was connected to the monitor and placed in the left lateral position.  Continuous oxygen was provided by nasal cannula and IV medicine administered through an indwelling cannula.  After administration of sedation, the patients esophagus was intubated and the     endoscope was advanced under direct visualization to the second portion of the duodenum.  The scope was removed slowly by carefully examining the color, texture, anatomy, and integrity of the mucosa on the way out.  The patient was recovered in endoscopy and discharged home in satisfactory condition.  Estimated blood loss is zero unless otherwise noted in this procedure report.     ESOPHAGUS: There was a stricture at the gastroesophageal junction. The stricture was easily traversable.  STOMACH: A medium sized hiatal hernia was noted.   Four non-bleeding, clean-based and punctate ulcers ranging between 3-69mm in size with surrounding edema were found on the lesser curvature of the gastric body and greater curvature gastric body.  Biopsies were taken around the ulcers.   Mild non-erosive gastritis (inflammation) was found in the gastric antrum.  DUODENUM: The duodenal mucosa showed no abnormalities in the bulb and 2nd  part of the duodenum. COMPLICATIONS: There were no immediate complications.  ENDOSCOPIC IMPRESSION: 1.   PATENT stricture at the gastroesophageal junction 2.   Medium sized hiatal hernia 3.   Four GASTRIC ulcers on the lesser curvature of the gastric body and greater curvature gastric body  RECOMMENDATIONS: COMPLETE THE CURRENT BOTTLE OF URSO.  CONTINUE CHENODIOL 250 MG BID WITH A 90 DAY SUPPLY. STRICTLY AVOID ASPIRIN, AND NSAIDS FOR 3 MOS DUE TO PUD. USE TYLENOL AS NEEDED FOR PAIN. PROTONIX 30 MINUTES PRIOR TO FIRST MEAL DAILY. AWAIT BIOPSY RESULTS. FOLLOW UP IN 3 MOS FOR EGD TO ASSESS HEALING. CONSIDER EUS. REPEAT LIVER PANEL IN 4 MOS.  REPEAT EXAM:    eSigned:  Danie Binder, MD 10-29-14 11:04 AM   CPT CODES: ICD CODES:  The ICD and CPT codes recommended by this software are interpretations from the data that the clinical staff has captured with the software.  The verification of the translation of this report to the ICD and CPT codes and modifiers is the sole responsibility of the health care institution and practicing physician where this report was generated.  [place]. will not be held responsible for the validity of the ICD and CPT codes included on this report.  AMA assumes no liability for data contained or not contained herein. CPT is a Designer, television/film set of the [employer].

## 2014-10-17 NOTE — Telephone Encounter (Signed)
PER ENDO. PATIENT NEEDS REPEAT LIVER PANEL IN 4 MONTHS

## 2014-10-17 NOTE — Discharge Instructions (Signed)
YOU STILL HAVE ulcers in your stomach BUT THEY ARE IN A DIFFERENT LOCATION. I BIOPSIED YOUR ULCERS AND YOUR STOMACH.    COMPLETE THE CURRENT BOTTLE OF URSO. YOU WILL BE SENT A NEW BOTTLE FOR CHENODIOL 250 MG BID WITH A 90 DAY SUPPLY.  STRICTLY AVOID ASPIRIN, BC/GOODY POWDERS, IBUPROFEN/MOTRIN, OR NAPROXEN/ALEVE FOR 3 MOS BECAUSE YOU HAVE A ULCERS IN YOUR STOMACH.  USE TYLENOL AS NEEDED FOR PAIN.  CONTINUE PROTONIX  TAKE 30 MINUTES PRIOR TO YOUR FIRST MEAL DAILY.  YOUR BIOPSY RESULTS WILL BE AVAILABLE IN MY CHART AFTER JUL 27 AND MY OFFICE WILL CONTACT YOU IN 10-14 DAYS WITH YOUR RESULTS.    FOLLOW UP IN 3 MOS FOR REPEAT UPPER ENDOSCOPY TO MAKE SURE THE ULCERS HAVE HEALED.  REPEAT LIVER PANEL IN 4 MOS.    UPPER ENDOSCOPY AFTER CARE Read the instructions outlined below and refer to this sheet in the next week. These discharge instructions provide you with general information on caring for yourself after you leave the hospital. While your treatment has been planned according to the most current medical practices available, unavoidable complications occasionally occur. If you have any problems or questions after discharge, call DR. Jarika Robben, [phone].  ACTIVITY  You may resume your regular activity, but move at a slower pace for the next 24 hours.   Take frequent rest periods for the next 24 hours.   Walking will help get rid of the air and reduce the bloated feeling in your belly (abdomen).   No driving for 24 hours (because of the medicine (anesthesia) used during the test).   You may shower.   Do not sign any important legal documents or operate any machinery for 24 hours (because of the anesthesia used during the test).    NUTRITION  Drink plenty of fluids.   You may resume your normal diet as instructed by your doctor.   Begin with a light meal and progress to your normal diet. Heavy or fried foods are harder to digest and may make you feel sick to your stomach  (nauseated).   Avoid alcoholic beverages for 24 hours or as instructed.    MEDICATIONS  You may resume your normal medications.   WHAT YOU CAN EXPECT TODAY  Some feelings of bloating in the abdomen.   Passage of more gas than usual.    IF YOU HAD A BIOPSY TAKEN DURING THE UPPER ENDOSCOPY:    Eat a soft diet IF YOU HAVE NAUSEA, BLOATING, ABDOMINAL PAIN, OR VOMITING.    FINDING OUT THE RESULTS OF YOUR TEST Not all test results are available during your visit. DR. Oneida Alar WILL CALL YOU WITHIN 14 DAYS OF YOUR PROCEDUE WITH YOUR RESULTS. Do not assume everything is normal if you have not heard from DR. Tava Peery, CALL HER OFFICE AT [phone].  SEEK IMMEDIATE MEDICAL ATTENTION AND CALL THE OFFICE: [phone] IF:  You have more than a spotting of blood in your stool.   Your belly is swollen (abdominal distention).   You are nauseated or vomiting.   You have a temperature over 101F.   You have abdominal pain or discomfort that is severe or gets worse throughout the day.   Ulcer Disease (Peptic Ulcer, Gastric Ulcer, Duodenal Ulcer) You have an ulcer. This may be in your stomach (gastric ulcer) or in the first part of your small bowel, the duodenum (duodenal ulcer). An ulcer is a break in the lining of the stomach or duodenum. The ulcer causes erosion into the deeper  tissue.  CAUSES The stomach has a lining to protect itself from the acid that digests food. The lining can be damaged in two main ways:  The Helico Pylori bacteria (H. Pyolori) can infect the lining of the stomach and cause ulcers.   ASPIRIN AND Nonsteroidal, anti-inflammatory medications (NSAIDS) can cause gastric ulcerations.   Smoking tobacco can increase the acid in the stomach. This can lead to ulcers, and will impair healing of ulcers.   Other factors, such as alcohol use and stress may contribute to ulcer formation.  SYMPTOMS The problems (symptoms) of ulcer disease are usually a burning or gnawing  of the mid-upper belly (abdomen). This is often worse on an empty stomach and may get better with food. This may be associated with feeling sick to your stomach (nausea), bloating, and vomiting.

## 2014-10-17 NOTE — H&P (Addendum)
Primary Care Physician:  Walker Kehr, MD Primary Gastroenterologist:  Dr. Oneida Alar  Pre-Procedure History & Physical: HPI:  [name] [name] is a 81 y.o. male here for peptic ulcer disease-confirm healing.  Past Medical History  Diagnosis Date  . Hypertension   . Hypothyroid   . CAD (coronary artery disease)     Dr. Lovena Le  . Osteoarthritis   . BPH (benign prostatic hypertrophy)     Dr. Terance Hart  . Osteoporosis     (took actonel x 5y)  . Renal insufficiency     Dr. Armstead Peaks  . Hip fx     x 2 on R.  . Sternum fx 2011  . Urinary incontinence   . Hearing loss   . Seizures     Past Surgical History  Procedure Laterality Date  . Heart disease      permanent pacemaker  . Appendectomy    . Inguinal hernia repair    . Hip fracture surgery  2011    ORIF  R.  . Pacemaker insertion    . Colonoscopy  12/1998, 02/2004    diverticulosois, external hemorrhoids  . Esophagogastroduodenoscopy  06/17/2014    Dr. Oneida Alar: 1. Patent stricture at the gastroesophageal junction 2. UGIB Due to multiple  gastric ulcers 3. Moderate Duodentitis. Negative H.pylori    Prior to Admission medications   Medication Sig Start Date End Date Taking? Authorizing Provider  albuterol (PROVENTIL) (2.5 MG/3ML) 0.083% nebulizer solution USE ONE VIAL VIA NEBULIZER FOUR TIMES DAILY AS NEEDED. Patient taking differently: Take 2.5 mg by nebulization 2 (two) times daily. USE ONE VIAL VIA NEBULIZER FOUR TIMES DAILY AS NEEDED. For shortness of breath or wheezing. 02/04/14  Yes Aleksei Plotnikov V, MD  Ascorbic Acid (VITAMIN C PO) Take 1 tablet by mouth daily.   Yes Historical Provider, MD  aspirin 81 MG tablet Take 81 mg by mouth daily.   Yes Historical Provider, MD  atorvastatin (LIPITOR) 20 MG tablet TAKE 1/2 TABLET  TWICE DAILY 07/26/14  Yes Aleksei Plotnikov V, MD  Bepotastine Besilate 1.5 % SOLN 1 gtt in R eye qd Patient taking differently: Place 1 drop into the right eye daily.  09/28/13  Yes Aleksei Plotnikov  V, MD  Cholecalciferol (VITAMIN D3) 1000 UNITS tablet Take 1,000 Units by mouth daily.     Yes Historical Provider, MD  diltiazem 2 % GEL Apply 1 application topically 3 (three) times daily. Patient taking differently: Apply 1 application topically 3 (three) times daily as needed.  02/01/14  Yes Aleksei Plotnikov V, MD  donepezil (ARICEPT) 10 MG tablet TAKE 1 TABLET AT BEDTIME 05/10/14  Yes Aleksei Plotnikov V, MD  fluticasone (FLONASE) 50 MCG/ACT nasal spray Place 2 sprays into both nostrils daily. 05/26/14  Yes Aleksei Plotnikov V, MD  folic acid (FOLVITE) 242 MCG tablet Take 800 mcg by mouth daily.    Yes Historical Provider, MD  levothyroxine (SYNTHROID, LEVOTHROID) 100 MCG tablet Take 1 tablet (100 mcg total) by mouth daily. 09/27/14  Yes Aleksei Plotnikov V, MD  metoprolol succinate (TOPROL-XL) 25 MG 24 hr tablet Take 0.5 tablets (12.5 mg total) by mouth 2 (two) times daily. 05/26/14  Yes Aleksei Plotnikov V, MD  pantoprazole (PROTONIX) 40 MG tablet Take 1 tablet (40 mg total) by mouth daily. 09/20/14  Yes Orvil Feil, NP  triamcinolone (NASACORT) 55 MCG/ACT nasal inhaler Place 1 spray into the nose daily. 05/01/12  Yes Aleksei Plotnikov V, MD  ursodiol (ACTIGALL) 500 MG tablet Take 1 tablet (500 mg total) by  mouth 2 (two) times daily. With food 09/20/14  Yes Orvil Feil, NP  nitroGLYCERIN (NITROSTAT) 0.4 MG SL tablet Place 1 tablet (0.4 mg total) under the tongue every 5 (five) minutes as needed. Chest pain 07/26/14 07/24/16  Cassandria Anger, MD    Allergies as of 09/20/2014  . (No Known Allergies)    Family History  Problem Relation Age of Onset  . Coronary artery disease      male 1st degree relative<60  . Hypertension    . Cancer Sister     History   Social History  . Marital Status: Married    Spouse Name: N/A  . Number of Children: 2  . Years of Education: N/A   Occupational History  . retired    Social History Main Topics  . Smoking status: Former Smoker -- 1.00 packs/day  for 30 years    Quit date: 12/29/1950  . Smokeless tobacco: Never Used  . Alcohol Use: No  . Drug Use: No  . Sexual Activity: Not Currently   Other Topics Concern  . Not on file   Social History Narrative   Regular exercise--no.    Review of Systems: See HPI, otherwise negative ROS   Physical Exam: BP 153/81 mmHg  Pulse 66  Temp(Src) 98.7 F (37.1 C) (Oral)  Resp 16  Ht 5\' 4"  (1.626 m)  Wt 150 lb (68.04 kg)  BMI 25.73 kg/m2  SpO2 99% General:   Alert,  pleasant and cooperative in NAD Head:  Normocephalic and atraumatic. Neck:  Supple; Lungs:  Clear throughout to auscultation.    Heart:  Regular rate and rhythm. Abdomen:  Soft, nontender and nondistended. Normal bowel sounds, without guarding, and without rebound.   Neurologic:  Alert and  oriented x4;  grossly normal neurologically.  Impression/Plan:    PUD  PLAN:  REPEAT EGD TO CONFIRM ULCERS ARE HEALED. Pt wantS cheaper URSO. DISCUSSED WITH MIKE. CHANGED TO CHENODIOL ON FORMULARY AT 5-7 MG/KG.

## 2014-10-19 ENCOUNTER — Other Ambulatory Visit: Payer: Self-pay

## 2014-10-19 DIAGNOSIS — K743 Primary biliary cirrhosis: Secondary | ICD-10-CM

## 2014-10-19 NOTE — Telephone Encounter (Signed)
Lab order on file for 02/17/2015.

## 2014-10-21 ENCOUNTER — Other Ambulatory Visit (HOSPITAL_COMMUNITY): Payer: Self-pay | Admitting: Oncology

## 2014-10-21 ENCOUNTER — Telehealth: Payer: Self-pay | Admitting: *Deleted

## 2014-10-21 ENCOUNTER — Telehealth: Payer: Self-pay | Admitting: Gastroenterology

## 2014-10-21 ENCOUNTER — Encounter (HOSPITAL_COMMUNITY): Payer: Self-pay | Admitting: Gastroenterology

## 2014-10-21 NOTE — Telephone Encounter (Signed)
Dr. 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 If any questions, can call Dr. Oneida Alar at [phone] or page him at [phone].

## 2014-10-21 NOTE — Telephone Encounter (Signed)
DISCUSSED RESULTS WITH MR. KEFALAS. WILL GET PT SEEN SOON.

## 2014-10-21 NOTE — Telephone Encounter (Signed)
Patient has 3 month fu with [place] for repeat EGD

## 2014-10-21 NOTE — Telephone Encounter (Signed)
Called patient TO DISCUSS RESULTS. LVM-CALL [phone] OR [phone] TO DISCUSS. SPOKE WITH DAUGHTER SHARON LOFTIS. AWARE OF RESULTS. HER FATHER DOESN'T ANSWER PHONE IF HE DOESN'T RECOGNIZE THE NUMBER. HE WILL LIKELY NEED FOLLOW UP EGD. WILL AWAIT RECS FROM ONCOLOGY.

## 2014-10-22 NOTE — Telephone Encounter (Signed)
Noted. Thx.

## 2014-10-24 ENCOUNTER — Telehealth: Payer: Self-pay | Admitting: Gastroenterology

## 2014-10-24 NOTE — Telephone Encounter (Signed)
PATIENT ON RECALL FOR LFT'S

## 2014-10-24 NOTE — Telephone Encounter (Signed)
Noted  

## 2014-10-25 ENCOUNTER — Other Ambulatory Visit: Payer: Self-pay

## 2014-10-25 DIAGNOSIS — K743 Primary biliary cirrhosis: Secondary | ICD-10-CM

## 2014-10-25 NOTE — Telephone Encounter (Signed)
Lab order on file for September 2016.

## 2014-10-26 ENCOUNTER — Emergency Department (HOSPITAL_COMMUNITY): Payer: Medicare Other

## 2014-10-26 ENCOUNTER — Ambulatory Visit (HOSPITAL_COMMUNITY): Payer: Medicare Other | Admitting: Hematology & Oncology

## 2014-10-26 ENCOUNTER — Encounter (HOSPITAL_COMMUNITY): Payer: Self-pay

## 2014-10-26 ENCOUNTER — Inpatient Hospital Stay (HOSPITAL_COMMUNITY)
Admission: EM | Admit: 2014-10-26 | Discharge: 2014-11-02 | DRG: 872 | Disposition: A | Discharge status: Home / Self Care | Payer: Medicare Other | Attending: Internal Medicine | Admitting: Internal Medicine

## 2014-10-26 DIAGNOSIS — R509 Fever, unspecified: Secondary | ICD-10-CM | POA: Diagnosis not present

## 2014-10-26 DIAGNOSIS — Z77090 Contact with and (suspected) exposure to asbestos: Secondary | ICD-10-CM | POA: Diagnosis present

## 2014-10-26 DIAGNOSIS — I2510 Atherosclerotic heart disease of native coronary artery without angina pectoris: Secondary | ICD-10-CM | POA: Diagnosis present

## 2014-10-26 DIAGNOSIS — N281 Cyst of kidney, acquired: Secondary | ICD-10-CM | POA: Diagnosis not present

## 2014-10-26 DIAGNOSIS — I959 Hypotension, unspecified: Secondary | ICD-10-CM | POA: Diagnosis not present

## 2014-10-26 DIAGNOSIS — Z950 Presence of cardiac pacemaker: Secondary | ICD-10-CM

## 2014-10-26 DIAGNOSIS — E039 Hypothyroidism, unspecified: Secondary | ICD-10-CM | POA: Diagnosis present

## 2014-10-26 DIAGNOSIS — D696 Thrombocytopenia, unspecified: Secondary | ICD-10-CM | POA: Diagnosis present

## 2014-10-26 DIAGNOSIS — K743 Primary biliary cirrhosis: Secondary | ICD-10-CM | POA: Diagnosis present

## 2014-10-26 DIAGNOSIS — C8339 Diffuse large B-cell lymphoma, extranodal and solid organ sites: Secondary | ICD-10-CM | POA: Diagnosis not present

## 2014-10-26 DIAGNOSIS — R918 Other nonspecific abnormal finding of lung field: Secondary | ICD-10-CM | POA: Diagnosis not present

## 2014-10-26 DIAGNOSIS — N183 Chronic kidney disease, stage 3 (moderate): Secondary | ICD-10-CM | POA: Diagnosis present

## 2014-10-26 DIAGNOSIS — E871 Hypo-osmolality and hyponatremia: Secondary | ICD-10-CM | POA: Diagnosis present

## 2014-10-26 DIAGNOSIS — R5081 Fever presenting with conditions classified elsewhere: Secondary | ICD-10-CM | POA: Diagnosis not present

## 2014-10-26 DIAGNOSIS — B961 Klebsiella pneumoniae [K. pneumoniae] as the cause of diseases classified elsewhere: Secondary | ICD-10-CM | POA: Diagnosis present

## 2014-10-26 DIAGNOSIS — I429 Cardiomyopathy, unspecified: Secondary | ICD-10-CM | POA: Diagnosis present

## 2014-10-26 DIAGNOSIS — Z8249 Family history of ischemic heart disease and other diseases of the circulatory system: Secondary | ICD-10-CM | POA: Diagnosis not present

## 2014-10-26 DIAGNOSIS — H9190 Unspecified hearing loss, unspecified ear: Secondary | ICD-10-CM | POA: Diagnosis present

## 2014-10-26 DIAGNOSIS — I5022 Chronic systolic (congestive) heart failure: Secondary | ICD-10-CM | POA: Diagnosis not present

## 2014-10-26 DIAGNOSIS — A4159 Other Gram-negative sepsis: Principal | ICD-10-CM | POA: Diagnosis present

## 2014-10-26 DIAGNOSIS — C8335 Diffuse large B-cell lymphoma, lymph nodes of inguinal region and lower limb: Secondary | ICD-10-CM | POA: Diagnosis not present

## 2014-10-26 DIAGNOSIS — K862 Cyst of pancreas: Secondary | ICD-10-CM | POA: Diagnosis not present

## 2014-10-26 DIAGNOSIS — J449 Chronic obstructive pulmonary disease, unspecified: Secondary | ICD-10-CM | POA: Diagnosis present

## 2014-10-26 DIAGNOSIS — Z79899 Other long term (current) drug therapy: Secondary | ICD-10-CM

## 2014-10-26 DIAGNOSIS — R7881 Bacteremia: Secondary | ICD-10-CM | POA: Diagnosis not present

## 2014-10-26 DIAGNOSIS — R069 Unspecified abnormalities of breathing: Secondary | ICD-10-CM | POA: Diagnosis not present

## 2014-10-26 DIAGNOSIS — I129 Hypertensive chronic kidney disease with stage 1 through stage 4 chronic kidney disease, or unspecified chronic kidney disease: Secondary | ICD-10-CM | POA: Diagnosis present

## 2014-10-26 DIAGNOSIS — K8020 Calculus of gallbladder without cholecystitis without obstruction: Secondary | ICD-10-CM | POA: Diagnosis not present

## 2014-10-26 DIAGNOSIS — N184 Chronic kidney disease, stage 4 (severe): Secondary | ICD-10-CM | POA: Diagnosis not present

## 2014-10-26 DIAGNOSIS — Z809 Family history of malignant neoplasm, unspecified: Secondary | ICD-10-CM

## 2014-10-26 DIAGNOSIS — R6883 Chills (without fever): Secondary | ICD-10-CM | POA: Diagnosis not present

## 2014-10-26 DIAGNOSIS — C7800 Secondary malignant neoplasm of unspecified lung: Secondary | ICD-10-CM

## 2014-10-26 DIAGNOSIS — K279 Peptic ulcer, site unspecified, unspecified as acute or chronic, without hemorrhage or perforation: Secondary | ICD-10-CM | POA: Diagnosis present

## 2014-10-26 DIAGNOSIS — M810 Age-related osteoporosis without current pathological fracture: Secondary | ICD-10-CM | POA: Diagnosis present

## 2014-10-26 DIAGNOSIS — K7460 Unspecified cirrhosis of liver: Secondary | ICD-10-CM | POA: Diagnosis not present

## 2014-10-26 DIAGNOSIS — J9811 Atelectasis: Secondary | ICD-10-CM | POA: Diagnosis not present

## 2014-10-26 DIAGNOSIS — R0602 Shortness of breath: Secondary | ICD-10-CM

## 2014-10-26 DIAGNOSIS — I10 Essential (primary) hypertension: Secondary | ICD-10-CM | POA: Diagnosis present

## 2014-10-26 DIAGNOSIS — Z87891 Personal history of nicotine dependence: Secondary | ICD-10-CM

## 2014-10-26 DIAGNOSIS — Z95810 Presence of automatic (implantable) cardiac defibrillator: Secondary | ICD-10-CM | POA: Diagnosis not present

## 2014-10-26 DIAGNOSIS — C8330 Diffuse large B-cell lymphoma, unspecified site: Secondary | ICD-10-CM | POA: Diagnosis not present

## 2014-10-26 DIAGNOSIS — Z8572 Personal history of non-Hodgkin lymphomas: Secondary | ICD-10-CM

## 2014-10-26 LAB — COMPREHENSIVE METABOLIC PANEL
ALT: 135 U/L — ABNORMAL HIGH (ref 17–63)
ANION GAP: 12 (ref 5–15)
AST: 222 U/L — AB (ref 15–41)
Albumin: 3.6 g/dL (ref 3.5–5.0)
Alkaline Phosphatase: 152 U/L — ABNORMAL HIGH (ref 38–126)
BUN: 34 mg/dL — ABNORMAL HIGH (ref 6–20)
CHLORIDE: 98 mmol/L — AB (ref 101–111)
CO2: 22 mmol/L (ref 22–32)
Calcium: 8.6 mg/dL — ABNORMAL LOW (ref 8.9–10.3)
Creatinine, Ser: 1.71 mg/dL — ABNORMAL HIGH (ref 0.61–1.24)
GFR calc Af Amer: 39 mL/min — ABNORMAL LOW (ref >60)
GFR, EST NON AFRICAN AMERICAN: 33 mL/min — AB (ref >60)
Glucose, Bld: 102 mg/dL — ABNORMAL HIGH (ref 65–99)
Potassium: 4.3 mmol/L (ref 3.5–5.1)
Sodium: 132 mmol/L — ABNORMAL LOW (ref 135–145)
TOTAL PROTEIN: 6.8 g/dL (ref 6.5–8.1)
Total Bilirubin: 2.5 mg/dL — ABNORMAL HIGH (ref 0.3–1.2)

## 2014-10-26 LAB — URINALYSIS, ROUTINE W REFLEX MICROSCOPIC
Bilirubin Urine: NEGATIVE
Glucose, UA: NEGATIVE mg/dL
HGB URINE DIPSTICK: NEGATIVE
KETONES UR: NEGATIVE mg/dL
Leukocytes, UA: NEGATIVE
NITRITE: NEGATIVE
PROTEIN: NEGATIVE mg/dL
Specific Gravity, Urine: 1.015 (ref 1.005–1.030)
Urobilinogen, UA: 0.2 mg/dL (ref 0.0–1.0)
pH: 6 (ref 5.0–8.0)

## 2014-10-26 LAB — I-STAT CG4 LACTIC ACID, ED
LACTIC ACID, VENOUS: 1.55 mmol/L (ref 0.5–2.0)
Lactic Acid, Venous: 1.15 mmol/L (ref 0.5–2.0)

## 2014-10-26 LAB — CBC WITH DIFFERENTIAL/PLATELET
Basophils Absolute: 0 10*3/uL (ref 0.0–0.1)
Basophils Relative: 0 % (ref 0–1)
EOS PCT: 0 % (ref 0–5)
Eosinophils Absolute: 0.1 10*3/uL (ref 0.0–0.7)
HCT: 40.9 % (ref 39.0–52.0)
Hemoglobin: 14.1 g/dL (ref 13.0–17.0)
LYMPHS ABS: 0.3 10*3/uL — AB (ref 0.7–4.0)
LYMPHS PCT: 2 % — AB (ref 12–46)
MCH: 30.3 pg (ref 26.0–34.0)
MCHC: 34.5 g/dL (ref 30.0–36.0)
MCV: 87.8 fL (ref 78.0–100.0)
MONOS PCT: 8 % (ref 3–12)
Monocytes Absolute: 1.2 10*3/uL — ABNORMAL HIGH (ref 0.1–1.0)
NEUTROS PCT: 90 % — AB (ref 43–77)
Neutro Abs: 12.9 10*3/uL — ABNORMAL HIGH (ref 1.7–7.7)
Platelets: 125 10*3/uL — ABNORMAL LOW (ref 150–400)
RBC: 4.66 MIL/uL (ref 4.22–5.81)
RDW: 15.1 % (ref 11.5–15.5)
WBC: 14.5 10*3/uL — ABNORMAL HIGH (ref 4.0–10.5)

## 2014-10-26 LAB — SEDIMENTATION RATE: SED RATE: 23 mm/h — AB (ref 0–16)

## 2014-10-26 LAB — BRAIN NATRIURETIC PEPTIDE: B Natriuretic Peptide: 355 pg/mL — ABNORMAL HIGH (ref 0.0–100.0)

## 2014-10-26 LAB — C-REACTIVE PROTEIN: CRP: 0.6 mg/dL (ref <1.0)

## 2014-10-26 LAB — LIPASE, BLOOD: Lipase: 86 U/L — ABNORMAL HIGH (ref 22–51)

## 2014-10-26 LAB — TSH: TSH: 11.336 u[IU]/mL — AB (ref 0.350–4.500)

## 2014-10-26 LAB — MRSA PCR SCREENING: MRSA BY PCR: NEGATIVE

## 2014-10-26 MED ORDER — ONDANSETRON HCL 4 MG PO TABS: 4.0000 mg | ORAL_TABLET | Freq: Four times a day (QID) | ORAL | Status: DC | PRN

## 2014-10-26 MED ORDER — FLUTICASONE PROPIONATE 50 MCG/ACT NA SUSP
2.0000 | Freq: Every day | NASAL | Status: DC
  Administered 2014-10-26 – 2014-11-02 (×8): 2 via NASAL
  Filled 2014-10-26 (×4): qty 16

## 2014-10-26 MED ORDER — PANTOPRAZOLE SODIUM 40 MG PO TBEC
40.0000 mg | DELAYED_RELEASE_TABLET | Freq: Two times a day (BID) | ORAL | Status: DC
  Administered 2014-10-26 – 2014-11-02 (×15): 40 mg via ORAL
  Filled 2014-10-26 (×16): qty 1

## 2014-10-26 MED ORDER — SODIUM CHLORIDE 0.9 % IV SOLN
INTRAVENOUS | Status: DC
  Administered 2014-10-26: 1000 mL via INTRAVENOUS
  Administered 2014-10-26 – 2014-10-31 (×4): via INTRAVENOUS

## 2014-10-26 MED ORDER — FLUTICASONE PROPIONATE 50 MCG/ACT NA SUSP: 1.0000 | Freq: Every day | NASAL | Status: DC

## 2014-10-26 MED ORDER — HEPARIN SODIUM (PORCINE) 5000 UNIT/ML IJ SOLN
5000.0000 [IU] | Freq: Three times a day (TID) | INTRAMUSCULAR | Status: DC
  Administered 2014-10-26 – 2014-11-02 (×20): 5000 [IU] via SUBCUTANEOUS
  Filled 2014-10-26 (×24): qty 1

## 2014-10-26 MED ORDER — SODIUM CHLORIDE 0.9 % IV BOLUS (SEPSIS)
250.0000 mL | Freq: Once | INTRAVENOUS | Status: AC
  Administered 2014-10-26: 250 mL via INTRAVENOUS

## 2014-10-26 MED ORDER — DONEPEZIL HCL 10 MG PO TABS
10.0000 mg | ORAL_TABLET | Freq: Every day | ORAL | Status: DC
  Administered 2014-10-26 – 2014-11-01 (×7): 10 mg via ORAL
  Filled 2014-10-26 (×3): qty 2
  Filled 2014-10-26 (×2): qty 1
  Filled 2014-10-26 (×2): qty 2
  Filled 2014-10-26: qty 1

## 2014-10-26 MED ORDER — VANCOMYCIN HCL IN DEXTROSE 750-5 MG/150ML-% IV SOLN
750.0000 mg | INTRAVENOUS | Status: DC
  Administered 2014-10-27: 750 mg via INTRAVENOUS
  Filled 2014-10-26 (×2): qty 150

## 2014-10-26 MED ORDER — SODIUM CHLORIDE 0.9 % IV BOLUS (SEPSIS)
1000.0000 mL | Freq: Once | INTRAVENOUS | Status: AC
  Administered 2014-10-26: 1000 mL via INTRAVENOUS

## 2014-10-26 MED ORDER — ACETAMINOPHEN 325 MG PO TABS
650.0000 mg | ORAL_TABLET | Freq: Once | ORAL | Status: AC
  Administered 2014-10-26: 650 mg via ORAL
  Filled 2014-10-26: qty 2

## 2014-10-26 MED ORDER — PIPERACILLIN-TAZOBACTAM 3.375 G IVPB
3.3750 g | Freq: Three times a day (TID) | INTRAVENOUS | Status: DC
  Administered 2014-10-26 – 2014-10-29 (×8): 3.375 g via INTRAVENOUS
  Filled 2014-10-26 (×15): qty 50

## 2014-10-26 MED ORDER — ALUM & MAG HYDROXIDE-SIMETH 200-200-20 MG/5ML PO SUSP: 30.0000 mL | Freq: Four times a day (QID) | ORAL | Status: DC | PRN

## 2014-10-26 MED ORDER — VANCOMYCIN HCL IN DEXTROSE 1-5 GM/200ML-% IV SOLN
1000.0000 mg | Freq: Once | INTRAVENOUS | Status: AC
Start: 2014-10-26 — End: 2014-10-26
  Administered 2014-10-26: 1000 mg via INTRAVENOUS
  Filled 2014-10-26: qty 200

## 2014-10-26 MED ORDER — LEVOTHYROXINE SODIUM 100 MCG PO TABS
100.0000 ug | ORAL_TABLET | Freq: Every day | ORAL | Status: DC
  Administered 2014-10-26 – 2014-11-02 (×8): 100 ug via ORAL
  Filled 2014-10-26 (×9): qty 1

## 2014-10-26 MED ORDER — PIPERACILLIN-TAZOBACTAM 3.375 G IVPB 30 MIN
3.3750 g | Freq: Once | INTRAVENOUS | Status: AC
  Administered 2014-10-26: 3.375 g via INTRAVENOUS
  Filled 2014-10-26: qty 50

## 2014-10-26 MED ORDER — ALBUTEROL SULFATE (2.5 MG/3ML) 0.083% IN NEBU
2.5000 mg | INHALATION_SOLUTION | Freq: Two times a day (BID) | RESPIRATORY_TRACT | Status: DC
  Administered 2014-10-26 – 2014-11-02 (×15): 2.5 mg via RESPIRATORY_TRACT
  Filled 2014-10-26 (×15): qty 3

## 2014-10-26 MED ORDER — ACETAMINOPHEN 325 MG PO TABS: 650.0000 mg | ORAL_TABLET | Freq: Four times a day (QID) | ORAL | Status: DC | PRN

## 2014-10-26 MED ORDER — ONDANSETRON HCL 4 MG/2ML IJ SOLN: 4.0000 mg | Freq: Four times a day (QID) | INTRAMUSCULAR | Status: DC | PRN

## 2014-10-26 MED ORDER — ACETAMINOPHEN 650 MG RE SUPP: 650.0000 mg | Freq: Four times a day (QID) | RECTAL | Status: DC | PRN

## 2014-10-26 NOTE — ED Notes (Signed)
Ems reports pt was very active outside yesterday and this morning woke up aroun 0500 with chills and sob.  EMS reports temp was 98.5 orally,  o2 sat 91% on room air.  EMS started IV in left forearm.

## 2014-10-26 NOTE — ED Notes (Signed)
Pt resting with eyes shut.  Easily arrouses.  Feels cooler to touch ,  Rechecked rectal temp.  Pt states he does feel better.

## 2014-10-26 NOTE — Progress Notes (Signed)
ANTIBIOTIC CONSULT NOTE - INITIAL  Pharmacy Consult for Vancomycin and Zosyn Indication: sepsis / chills / fever  Allergies  Allergen Reactions  . Aleve [Naproxen Sodium] Other (See Comments)    bleeding  . Ibuprofen Other (See Comments)    bleeding   Patient Measurements: Height: 5\' 4"  (162.6 cm) Weight: 152 lb (68.947 kg) IBW/kg (Calculated) : 59.2  Vital Signs: Temp: 100.9 F (38.3 C) (08/03 1139) Temp Source: Rectal (08/03 1139) BP: 95/50 mmHg (08/03 1430) Pulse Rate: 66 (08/03 1300) Intake/Output from previous day:   Intake/Output from this shift:    Labs:  Recent Labs  10/26/14 1003  WBC 14.5*  HGB 14.1  PLT 125*  CREATININE 1.71*   Estimated Creatinine Clearance: 24 mL/min (by C-G formula based on Cr of 1.71). No results for input(s): VANCOTROUGH, VANCOPEAK, VANCORANDOM, GENTTROUGH, GENTPEAK, GENTRANDOM, TOBRATROUGH, TOBRAPEAK, TOBRARND, AMIKACINPEAK, AMIKACINTROU, AMIKACIN in the last 72 hours.   Microbiology: No results found for this or any previous visit (from the past 720 hour(s)).  Medical History: Past Medical History  Diagnosis Date  . Hypertension   . Hypothyroid   . CAD (coronary artery disease)     Dr. Lovena Le  . Osteoarthritis   . BPH (benign prostatic hypertrophy)     Dr. Terance Hart  . Osteoporosis     (took actonel x 5y)  . Renal insufficiency     Dr. Armstead Peaks  . Hip fx     x 2 on R.  . Sternum fx 2011  . Urinary incontinence   . Hearing loss   . Seizures   . Cancer     B cell lymphoma stomach   Vancomycin and Zosyn (received initial doses in ED) 8/3 >>  Assessment: 81yo male with PMH that includes hypertension, primary biliary cirrhosis, hypothyroidism, CAD dose post pacemaker , chronic kidney disease most recently B-cell lymphoma of the stomach presents to the emergency department with the chief complaint of severe generalized weakness and chills. Initial evaluation in the emergency department reveals sepsis of unknown  source with fever hypotension, leukocytosis.  clcr ~ 24-28ml/min  Goal of Therapy:  Vancomycin trough level 15-20 mcg/ml  Plan:  Vancomycin 750mg  IV q24hrs Check trough at steady state Zosyn 3.375gm IV q8h, each dose over 4 hrs Monitor labs, renal fxn, and c/s  [place] Crane, Vincy Feliz A 10/26/2014,3:10 PM

## 2014-10-26 NOTE — H&P (Signed)
[facility] History and Physical  [name] [name]:[id number] DOB: 1922-10-12 DOA: 10/26/2014  Referring physician:  PCP: Walker Kehr, MD   Chief Complaint: chills generalized weakness  HPI: [name] [name] is a very pleasant 81 y.o. male with a past medical history that includes hypertension, primary biliary cirrhosis, hypothyroidism, CAD dose post pacemaker , chronic kidney disease most recently B-cell lymphoma of the stomach presents to the emergency department with the chief complaint of severe generalized weakness and chills. Initial evaluation in the emergency department reveals sepsis of unknown source with fever hypotension, leukocytosis.  Patient reports he was in his usual state of health yesterday and felt well when he went to bed last night. He awakened at 5 AM and had "chills all over". Associated symptoms include generalized weakness such that he had difficulty getting out of bed. He denies headache dizziness syncope or near-syncope. He denies chest pain palpitations shortness of breath. He denies abdominal pain nausea vomiting diarrhea constipation. He denies dysuria hematuria frequency or urgency.   Workup in the emergency department includes complete metabolic panel significant for sodium of 132 creatinine 1.7 serum glucose 102 alkaline phosphatase 152 AST 222 ALT 135 total bilirubin 2.5, BNP 355 lactic acid 1.55. CBC significant for leukocytosis of 14.5 and platelets 125. Urinalysis is unremarkable, chest x-ray Increased interstitial densities bilaterally suggest acute bronchitis or early interstitial pneumonia. There is no definite pulmonary edema though early interstitial edema could be manifested in a similar fashion. CT of the chest reveals multiple new pulmonary nodules bilaterally highly worrisome for pulmonary metastatic disease. Extensive atherosclerotic disease. Cirrhotic liver. Cholelithiasis. Extensive renal cystic disease. Diffuse wall thickening of the  esophagus, nonspecific, can be seen with esophagitis and reflux disease though tumor not entirely excluded; consider followup endoscopy when clinically appropriate.  In the emergency department his oxygen saturation level is 93% on 2 L and his temperature is 025.4  with a systolic blood pressure in the high 80s.Marland Kitchen He is provided with 1-1/2 L of normal saline vancomycin and Zosyn. At the time of my exam his blood pressure. At the time of my exam systolic blood pressures 97.   Review of Systems:  10 point review of systems complete and all systems are negative except as indicated in history of present illness  Past Medical History  Diagnosis Date  . Hypertension   . Hypothyroid   . CAD (coronary artery disease)     Dr. Lovena Le  . Osteoarthritis   . BPH (benign prostatic hypertrophy)     Dr. Terance Hart  . Osteoporosis     (took actonel x 5y)  . Renal insufficiency     Dr. Armstead Peaks  . Hip fx     x 2 on R.  . Sternum fx 2011  . Urinary incontinence   . Hearing loss   . Seizures   . Cancer     B cell lymphoma stomach   Past Surgical History  Procedure Laterality Date  . Heart disease      permanent pacemaker  . Appendectomy    . Inguinal hernia repair    . Hip fracture surgery  2011    ORIF  R.  . Pacemaker insertion    . Colonoscopy  12/1998, 02/2004    diverticulosois, external hemorrhoids  . Esophagogastroduodenoscopy  06/17/2014    Dr. Oneida Alar: 1. Patent stricture at the gastroesophageal junction 2. UGIB Due to multiple  gastric ulcers 3. Moderate Duodentitis. Negative H.pylori  . Esophagogastroduodenoscopy N/A 10/17/2014    Procedure: ESOPHAGOGASTRODUODENOSCOPY (EGD);  Surgeon: Danie Binder, MD;  Location: [facility];  Service: Endoscopy;  Laterality: N/A;  1030   Social History:  reports that he quit smoking about 63 years ago. He has never used smokeless tobacco. He reports that he does not drink alcohol or use illicit drugs. He has been retired since 1984 he lives  alone he is active and independent with ADLs Allergies  Allergen Reactions  . Aleve [Naproxen Sodium] Other (See Comments)    bleeding  . Ibuprofen Other (See Comments)    bleeding    Family History  Problem Relation Age of Onset  . Coronary artery disease      male 1st degree relative<60  . Hypertension    . Cancer Sister    family medical history reviewed and noncontributory to the admission of this elderly gentleman  Prior to Admission medications   Medication Sig Start Date End Date Taking? Authorizing Provider  albuterol (PROVENTIL) (2.5 MG/3ML) 0.083% nebulizer solution USE ONE VIAL VIA NEBULIZER FOUR TIMES DAILY AS NEEDED. Patient taking differently: Take 2.5 mg by nebulization 2 (two) times daily. USE ONE VIAL VIA NEBULIZER FOUR TIMES DAILY AS NEEDED. For shortness of breath or wheezing. 02/04/14  Yes Aleksei Plotnikov V, MD  Ascorbic Acid (VITAMIN C PO) Take 1 tablet by mouth daily.   Yes Historical Provider, MD  atorvastatin (LIPITOR) 20 MG tablet TAKE 1/2 TABLET  TWICE DAILY 07/26/14  Yes Aleksei Plotnikov V, MD  Cholecalciferol (VITAMIN D3) 1000 UNITS tablet Take 1,000 Units by mouth daily.     Yes Historical Provider, MD  diltiazem 2 % GEL Apply 1 application topically 3 (three) times daily. Patient taking differently: Apply 1 application topically 3 (three) times daily as needed.  02/01/14  Yes Aleksei Plotnikov V, MD  donepezil (ARICEPT) 10 MG tablet TAKE 1 TABLET AT BEDTIME 05/10/14  Yes Aleksei Plotnikov V, MD  fluticasone (FLONASE) 50 MCG/ACT nasal spray Place 2 sprays into both nostrils daily. 05/26/14  Yes Aleksei Plotnikov V, MD  folic acid (FOLVITE) 595 MCG tablet Take 800 mcg by mouth daily.    Yes Historical Provider, MD  levothyroxine (SYNTHROID, LEVOTHROID) 100 MCG tablet Take 1 tablet (100 mcg total) by mouth daily. 09/27/14  Yes Aleksei Plotnikov V, MD  metoprolol succinate (TOPROL-XL) 25 MG 24 hr tablet Take 0.5 tablets (12.5 mg total) by mouth 2 (two) times  daily. 05/26/14  Yes Aleksei Plotnikov V, MD  nitroGLYCERIN (NITROSTAT) 0.4 MG SL tablet Place 1 tablet (0.4 mg total) under the tongue every 5 (five) minutes as needed. Chest pain 07/26/14 07/24/16 Yes Aleksei Plotnikov V, MD  pantoprazole (PROTONIX) 40 MG tablet Take 1 tablet (40 mg total) by mouth daily. Patient taking differently: Take 40 mg by mouth 2 (two) times daily.  09/20/14  Yes Orvil Feil, NP  triamcinolone (NASACORT) 55 MCG/ACT nasal inhaler Place 1 spray into the nose daily. 05/01/12  Yes Aleksei Plotnikov V, MD  ursodiol (ACTIGALL) 500 MG tablet Take 1 tablet by mouth 2 (two) times daily. 09/20/14  Yes Historical Provider, MD   Physical Exam: Filed Vitals:   10/26/14 1130 10/26/14 1139 10/26/14 1200 10/26/14 1218  BP: 100/52  85/56 87/48  Pulse: 68  66 65  Temp:  100.9 F (38.3 C)    TempSrc:  Rectal    Resp: 23  17 17   Height:      Weight:      SpO2: 96%  96% 92%    Wt Readings from Last 3 Encounters:  10/26/14 68.947 kg (152 lb)  10/17/14 68.04 kg (150 lb)  09/27/14 68.493 kg (151 lb)    General:  Appears calm and comfortable, slightly ill-appearing but not toxic Eyes: PERRL, normal lids, irises & conjunctiva ENT: grossly normal hearing, his membranes of his mouth are slightly pink and dry Neck: no LAD, masses or thyromegaly Cardiovascular: RRR, no m/r/g. No LE edema. Respiratory: CTA bilaterally, no w/r/r. Normal respiratory effort. Abdomen: soft, ntnd positive bowel sounds throughout no guarding or rebounding Skin: no rash or induration seen on limited exam Musculoskeletal: grossly normal tone BUE/BLE Psychiatric: grossly normal mood and affect, speech fluent and appropriate Neurologic: grossly non-focal. Speech slow and deliberate but clear he is oriented to person place and time. Follows commands moves all extremities           Labs on Admission:  Basic Metabolic Panel:  Recent Labs Lab 10/26/14 1003  NA 132*  K 4.3  CL 98*  CO2 22  GLUCOSE 102*  BUN  34*  CREATININE 1.71*  CALCIUM 8.6*   Liver Function Tests:  Recent Labs Lab 10/26/14 1003  AST 222*  ALT 135*  ALKPHOS 152*  BILITOT 2.5*  PROT 6.8  ALBUMIN 3.6    Recent Labs Lab 10/26/14 1003  LIPASE 86*   No results for input(s): AMMONIA in the last 168 hours. CBC:  Recent Labs Lab 10/26/14 1003  WBC 14.5*  NEUTROABS 12.9*  HGB 14.1  HCT 40.9  MCV 87.8  PLT 125*   Cardiac Enzymes: No results for input(s): CKTOTAL, CKMB, CKMBINDEX, TROPONINI in the last 168 hours.  BNP (last 3 results)  Recent Labs  10/26/14 1003  BNP 355.0*    ProBNP (last 3 results) No results for input(s): PROBNP in the last 8760 hours.  CBG: No results for input(s): GLUCAP in the last 168 hours.  Radiological Exams on Admission: Ct Chest Wo Contrast  10/26/2014   CLINICAL DATA:  Awoke with chills and shortness of breath this morning at 0500 hours, hypoxemia, hypertension, was coronary disease, renal insufficiency, cirrhosis  EXAM: CT CHEST WITHOUT CONTRAST  TECHNIQUE: Multidetector CT imaging of the chest was performed following the standard protocol without IV contrast. Sagittal and coronal MPR images reconstructed from axial data set.  COMPARISON:  04/30/2014  FINDINGS: Dependent gallstones in gallbladder.  Large cyst at the upper poles of both kidneys again noted.  Cirrhotic appearing liver again identified.  Remaining visualized portion of upper abdomen grossly unremarkable.  Extensive atherosclerotic calcifications including coronary arteries.  AICD leads RIGHT atrium and RIGHT ventricle.  Upper normal sized RIGHT paratracheal node 10 mm short axis image 22.  No definite thoracic adenopathy.  Thickening of the wall of the mid to distal esophagus little changed.  Calcified pleural plaques in the RIGHT hemi thorax but not definitely seen on LEFT.  Calcified granulomata RIGHT lower lobe.  11 x 8 mm RIGHT upper lobe pulmonary nodule image 20, new.  21 x 17 mm lobulated medial RIGHT lower  lobe nodule image 44, new.  16 x 15 mm lobulated LEFT lower lobe nodule image 31, new.  Multiple additional new small BILATERAL pulmonary nodules.  Findings are highly concerning for pulmonary metastases.  Dependent atelectasis without definite infiltrate, pleural effusion or pneumothorax.  Diffuse osseous demineralization with multiple thoracic compression fractures, including a new fracture with interval vertebroplasty in the upper thoracic spine.  No definite osseous metastatic lesions.  IMPRESSION: Multiple new pulmonary nodules bilaterally highly worrisome for pulmonary metastatic disease.  Extensive atherosclerotic disease.  Cirrhotic liver.  Cholelithiasis.  Extensive renal cystic disease.  Diffuse wall thickening of the esophagus, nonspecific, can be seen with esophagitis and reflux disease though tumor not entirely excluded; consider followup endoscopy when clinically appropriate.   Electronically Signed   By: Lavonia Dana M.D.   On: 10/26/2014 12:13   Dg Chest Portable 1 View  10/26/2014   CLINICAL DATA:  Awakened this morning with chills and shortness of breath, was physically active yesterday, history of coronary artery disease  EXAM: PORTABLE CHEST - 1 VIEW  COMPARISON:  PA and lateral chest x-ray of April 30, 2014  FINDINGS: The lungs are adequately inflated. The interstitial markings are increased bilaterally. There is a stable calcified 1.3 cm x 1 cm nodule in the right lower lung. The heart is normal in size. The pulmonary vascularity is not engorged. The AICD is in reasonable position. There is no pleural effusion. There is tortuosity of the descending thoracic aorta. The observed bony thorax exhibits no acute abnormality.  IMPRESSION: Increased interstitial densities bilaterally suggest acute bronchitis or early interstitial pneumonia. There is no definite pulmonary edema though early interstitial edema could be manifested in a similar fashion.  A PA and lateral chest x-ray would be useful if  the patient can undergo the procedure.   Electronically Signed   By: David  Martinique M.D.   On: 10/26/2014 10:03    EKG:   Assessment/Plan Principal Problem:   Sepsis: Source unclear. Will admit to ICU we'll continue fluid resuscitation cycle lactic acid levels provide empiric antibiotic that were initiated in the emergency department. Will repeat blood cultures tomorrow. Will obtain a sedimentation rate and C-reactive protein. Urinalysis unremarkable chest x-ray inconclusive CT of the chest with multiple pulmonary nodules, not definite infiltrate some atelectasis. She does have a pacemaker/defibrillator so vegetative valve and differential. At the time of my exam a systolic blood pressure 97. Active Problems:    Essential hypertension: Blood pressure very soft in the emergency department. See #1. Home medications include low-dose beta blocker. I will hold this for now do to #1. Monitor closely.   B-cell lymphoma of the stomach: Patient had EGD late July biopsies resulted in B-cell lymphoma. He had an appointment with Dr.Penland this afternoon. Will request an oncology consult to establish patient contact and make any recommendations as needed.  Pulmonary nodules: multiple per CT worrisome for metastasis. No definitive infiltrate. Will request oncology consult as patient schedule to see today.   CKD (chronic kidney disease): Creatinine on admission 1.7. Chart review indicates this is slightly above baseline. Will monitor urine output. The fluids as noted above. Hold any nephrotoxic's. Recheck in the morning  Elevated transaminase: hx of primary biliary cirrhosis. Was started on Ursodiol per chart but that too expensive. Changed to chenodiol recently. See EGD report below. Today's levels trending up from last month. Monitor.     Thrombocytopenia: May be related to acute illness. No signs and symptoms of obvious bleeding.    Coronary atherosclerosis: Denies chest pain EKG without acute changes.  Status post pacemaker most recently serviced in May of this year. Continue Lipitor    Chronic systolic heart failure: Not on any diuretics. Does not appear overloaded. He's on a beta blocker which I will hold as noted above.    Hyponatremia: Mild and somewhat chronic according to his chart review. IV fluids as noted above. Will monitor.    COPD bronchitis: Appears stable at baseline. Will continue his home inhalers.    PUD (peptic ulcer disease) recent  EGD with four GASTRIC ulcers on the lesser curvature of the gastric body and greater curvature gastric body. Recommending protonix and to COMPLETE THE CURRENT BOTTLE OF URSO. CONTINUE CHENODIOL 250 MG BID WITH A 90 DAY SUPPLY.STRICTLY AVOID ASPIRIN, AND NSAIDS FOR 3 MOS DUE TO PUD. USE TYLENOL AS NEEDED FOR PAIN. PROTONIX 30 MINUTES PRIOR TO FIRST MEAL DAILY. FOLLOW UP IN 3 MOS FOR EGD TO ASSESS HEALING. CONSIDER EUS. REPEAT LIVER PANEL IN 4 MOS.   Code Status: full DVT Prophylaxis: Family Communication: none present Disposition Plan: home when ready  Time spent: 68 minutes  [facility] Pager [phone]

## 2014-10-26 NOTE — ED Notes (Signed)
Patient's family has patient's yellow watch and gray necklace

## 2014-10-26 NOTE — ED Provider Notes (Signed)
CSN: [id number]     Arrival date & time 10/26/14  3299 History  This chart was scribed for [name] Sorrow, MD by Starleen Arms, ED Scribe. This patient was seen in room APA01/APA01 and the patient's care was started at 9:56 AM.   Chief Complaint  Patient presents with  . Chills  . Shortness of Breath   The history is provided by the patient. No language interpreter was used.    HPI Comments: [name] [name] is a 81 y.o. male brought in by EMS with hx of asthma who presents complaining of chills onset this morning at 0500.  The patient also notes some suprapubic abdominal pain onset this morning. EMS responded to the patient's home where his 02 saturation was 91% and he was placed on 2L/min 02.  His 02 saturation in the ED is 93% with a temperature of 103.   Patient currently has stomach cancer (dx 3 days ago after endoscopy) and is followed by Dr. Oneida Alar.  He is scheduled to have an unknown GI procedure today at 2:00 pm.  He denies SOB, other pain.  PCP: [name] [name]  Past Medical History  Diagnosis Date  . Hypertension   . Hypothyroid   . CAD (coronary artery disease)     Dr. Lovena Le  . Osteoarthritis   . BPH (benign prostatic hypertrophy)     Dr. Terance Hart  . Osteoporosis     (took actonel x 5y)  . Renal insufficiency     Dr. Armstead Peaks  . Hip fx     x 2 on R.  . Sternum fx 2011  . Urinary incontinence   . Hearing loss   . Seizures   . Cancer     B cell lymphoma stomach   Past Surgical History  Procedure Laterality Date  . Heart disease      permanent pacemaker  . Appendectomy    . Inguinal hernia repair    . Hip fracture surgery  2011    ORIF  R.  . Pacemaker insertion    . Colonoscopy  12/1998, 02/2004    diverticulosois, external hemorrhoids  . Esophagogastroduodenoscopy  06/17/2014    Dr. Oneida Alar: 1. Patent stricture at the gastroesophageal junction 2. UGIB Due to multiple  gastric ulcers 3. Moderate Duodentitis. Negative H.pylori  . Esophagogastroduodenoscopy N/A  10/17/2014    Procedure: ESOPHAGOGASTRODUODENOSCOPY (EGD);  Surgeon: Danie Binder, MD;  Location: [facility];  Service: Endoscopy;  Laterality: N/A;  1030   Family History  Problem Relation Age of Onset  . Coronary artery disease      male 1st degree relative<60  . Hypertension    . Cancer Sister    History  Substance Use Topics  . Smoking status: Former Smoker -- 1.00 packs/day for 30 years    Quit date: 12/29/1950  . Smokeless tobacco: Never Used  . Alcohol Use: No    Review of Systems  Constitutional: Positive for chills. Negative for fever.  HENT: Negative for rhinorrhea and sore throat.   Eyes: Negative for visual disturbance.  Respiratory: Negative for cough and shortness of breath.   Cardiovascular: Negative for chest pain and leg swelling.  Gastrointestinal: Positive for abdominal pain. Negative for nausea, vomiting and diarrhea.  Genitourinary: Negative for dysuria.  Musculoskeletal: Negative for back pain and neck pain.  Skin: Positive for rash.  Neurological: Negative for headaches.  Hematological: Does not bruise/bleed easily.  Psychiatric/Behavioral: Negative for confusion.      Allergies  Aleve and Ibuprofen  Home Medications  Prior to Admission medications   Medication Sig Start Date End Date Taking? Authorizing Provider  albuterol (PROVENTIL) (2.5 MG/3ML) 0.083% nebulizer solution USE ONE VIAL VIA NEBULIZER FOUR TIMES DAILY AS NEEDED. Patient taking differently: Take 2.5 mg by nebulization 2 (two) times daily. USE ONE VIAL VIA NEBULIZER FOUR TIMES DAILY AS NEEDED. For shortness of breath or wheezing. 02/04/14  Yes Aleksei Plotnikov V, MD  Ascorbic Acid (VITAMIN C PO) Take 1 tablet by mouth daily.   Yes Historical Provider, MD  atorvastatin (LIPITOR) 20 MG tablet TAKE 1/2 TABLET  TWICE DAILY 07/26/14  Yes Aleksei Plotnikov V, MD  Cholecalciferol (VITAMIN D3) 1000 UNITS tablet Take 1,000 Units by mouth daily.     Yes Historical Provider, MD  diltiazem  2 % GEL Apply 1 application topically 3 (three) times daily. Patient taking differently: Apply 1 application topically 3 (three) times daily as needed.  02/01/14  Yes Aleksei Plotnikov V, MD  donepezil (ARICEPT) 10 MG tablet TAKE 1 TABLET AT BEDTIME 05/10/14  Yes Aleksei Plotnikov V, MD  fluticasone (FLONASE) 50 MCG/ACT nasal spray Place 2 sprays into both nostrils daily. 05/26/14  Yes Aleksei Plotnikov V, MD  folic acid (FOLVITE) 562 MCG tablet Take 800 mcg by mouth daily.    Yes Historical Provider, MD  levothyroxine (SYNTHROID, LEVOTHROID) 100 MCG tablet Take 1 tablet (100 mcg total) by mouth daily. 09/27/14  Yes Aleksei Plotnikov V, MD  metoprolol succinate (TOPROL-XL) 25 MG 24 hr tablet Take 0.5 tablets (12.5 mg total) by mouth 2 (two) times daily. 05/26/14  Yes Aleksei Plotnikov V, MD  nitroGLYCERIN (NITROSTAT) 0.4 MG SL tablet Place 1 tablet (0.4 mg total) under the tongue every 5 (five) minutes as needed. Chest pain 07/26/14 07/24/16 Yes Aleksei Plotnikov V, MD  pantoprazole (PROTONIX) 40 MG tablet Take 1 tablet (40 mg total) by mouth daily. Patient taking differently: Take 40 mg by mouth 2 (two) times daily.  09/20/14  Yes Orvil Feil, NP  triamcinolone (NASACORT) 55 MCG/ACT nasal inhaler Place 1 spray into the nose daily. 05/01/12  Yes Aleksei Plotnikov V, MD  ursodiol (ACTIGALL) 500 MG tablet Take 1 tablet by mouth 2 (two) times daily. 09/20/14  Yes Historical Provider, MD   BP 87/48 mmHg  Pulse 65  Temp(Src) 100.9 F (38.3 C) (Rectal)  Resp 17  Ht 5\' 4"  (1.626 m)  Wt 152 lb (68.947 kg)  BMI 26.08 kg/m2  SpO2 92% Physical Exam  Constitutional: He is oriented to person, place, and time. He appears well-developed and well-nourished. No distress.  HENT:  Head: Normocephalic and atraumatic.  Eyes: Conjunctivae and EOM are normal.  Pupils normal.  Sclear clear.  Eyes tracking normal.  Slight irregularity to right pupil (hx of cataract surgery)  Neck: Neck supple. No tracheal deviation present.   Cardiovascular: Normal rate and regular rhythm.   Pulmonary/Chest: Effort normal. No respiratory distress.  Lungs CTA bilaterally.   Abdominal: Soft. Bowel sounds are normal. There is no tenderness.  Flat  Musculoskeletal: Normal range of motion. He exhibits no edema.  Capillary refill in both feet less than 2 seconds.   Neurological: He is alert and oriented to person, place, and time. No cranial nerve deficit. He exhibits normal muscle tone. Coordination normal.  Skin: Skin is warm and dry.  Psychiatric: He has a normal mood and affect. His behavior is normal.  Nursing note and vitals reviewed.   ED Course  Procedures (including critical care time)  DIAGNOSTIC STUDIES: Oxygen Saturation is 93% on RA,  adequate by my interpretation.    COORDINATION OF CARE:  10:03 AM Discussed treatment plan with patient at bedside.  Patient acknowledges and agrees with plan.    Labs Review Labs Reviewed  CBC WITH DIFFERENTIAL/PLATELET - Abnormal; Notable for the following:    WBC 14.5 (*)    Platelets 125 (*)    Neutrophils Relative % 90 (*)    Neutro Abs 12.9 (*)    Lymphocytes Relative 2 (*)    Lymphs Abs 0.3 (*)    Monocytes Absolute 1.2 (*)    All other components within normal limits  COMPREHENSIVE METABOLIC PANEL - Abnormal; Notable for the following:    Sodium 132 (*)    Chloride 98 (*)    Glucose, Bld 102 (*)    BUN 34 (*)    Creatinine, Ser 1.71 (*)    Calcium 8.6 (*)    AST 222 (*)    ALT 135 (*)    Alkaline Phosphatase 152 (*)    Total Bilirubin 2.5 (*)    GFR calc non Af Amer 33 (*)    GFR calc Af Amer 39 (*)    All other components within normal limits  LIPASE, BLOOD - Abnormal; Notable for the following:    Lipase 86 (*)    All other components within normal limits  BRAIN NATRIURETIC PEPTIDE - Abnormal; Notable for the following:    B Natriuretic Peptide 355.0 (*)    All other components within normal limits  CULTURE, BLOOD (ROUTINE X 2)  CULTURE, BLOOD (ROUTINE  X 2)  URINE CULTURE  URINALYSIS, ROUTINE W REFLEX MICROSCOPIC (NOT AT [facility])  I-STAT CG4 LACTIC ACID, ED  I-STAT CG4 LACTIC ACID, ED    Imaging Review Ct Chest Wo Contrast  10/26/2014   CLINICAL DATA:  Awoke with chills and shortness of breath this morning at 0500 hours, hypoxemia, hypertension, was coronary disease, renal insufficiency, cirrhosis  EXAM: CT CHEST WITHOUT CONTRAST  TECHNIQUE: Multidetector CT imaging of the chest was performed following the standard protocol without IV contrast. Sagittal and coronal MPR images reconstructed from axial data set.  COMPARISON:  04/30/2014  FINDINGS: Dependent gallstones in gallbladder.  Large cyst at the upper poles of both kidneys again noted.  Cirrhotic appearing liver again identified.  Remaining visualized portion of upper abdomen grossly unremarkable.  Extensive atherosclerotic calcifications including coronary arteries.  AICD leads RIGHT atrium and RIGHT ventricle.  Upper normal sized RIGHT paratracheal node 10 mm short axis image 22.  No definite thoracic adenopathy.  Thickening of the wall of the mid to distal esophagus little changed.  Calcified pleural plaques in the RIGHT hemi thorax but not definitely seen on LEFT.  Calcified granulomata RIGHT lower lobe.  11 x 8 mm RIGHT upper lobe pulmonary nodule image 20, new.  21 x 17 mm lobulated medial RIGHT lower lobe nodule image 44, new.  16 x 15 mm lobulated LEFT lower lobe nodule image 31, new.  Multiple additional new small BILATERAL pulmonary nodules.  Findings are highly concerning for pulmonary metastases.  Dependent atelectasis without definite infiltrate, pleural effusion or pneumothorax.  Diffuse osseous demineralization with multiple thoracic compression fractures, including a new fracture with interval vertebroplasty in the upper thoracic spine.  No definite osseous metastatic lesions.  IMPRESSION: Multiple new pulmonary nodules bilaterally highly worrisome for pulmonary metastatic disease.   Extensive atherosclerotic disease.  Cirrhotic liver.  Cholelithiasis.  Extensive renal cystic disease.  Diffuse wall thickening of the esophagus, nonspecific, can be seen with esophagitis and reflux  disease though tumor not entirely excluded; consider followup endoscopy when clinically appropriate.   Electronically Signed   By: Lavonia Dana M.D.   On: 10/26/2014 12:13   Dg Chest Portable 1 View  10/26/2014   CLINICAL DATA:  Awakened this morning with chills and shortness of breath, was physically active yesterday, history of coronary artery disease  EXAM: PORTABLE CHEST - 1 VIEW  COMPARISON:  PA and lateral chest x-ray of April 30, 2014  FINDINGS: The lungs are adequately inflated. The interstitial markings are increased bilaterally. There is a stable calcified 1.3 cm x 1 cm nodule in the right lower lung. The heart is normal in size. The pulmonary vascularity is not engorged. The AICD is in reasonable position. There is no pleural effusion. There is tortuosity of the descending thoracic aorta. The observed bony thorax exhibits no acute abnormality.  IMPRESSION: Increased interstitial densities bilaterally suggest acute bronchitis or early interstitial pneumonia. There is no definite pulmonary edema though early interstitial edema could be manifested in a similar fashion.  A PA and lateral chest x-ray would be useful if the patient can undergo the procedure.   Electronically Signed   By: David  Martinique M.D.   On: 10/26/2014 10:03     EKG Interpretation   Date/Time:  Wednesday October 26 2014 09:43:54 EDT Ventricular Rate:  78 PR Interval:  186 QRS Duration: 130 QT Interval:  411 QTC Calculation: 468 R Axis:   77 Text Interpretation:  Sinus rhythm Nonspecific intraventricular conduction  delay Inferior infarct, age indeterminate Probable anterior infarct, age  indeterminate No significant change since last tracing Confirmed by  Byanka Landrus  MD, Aundreya Souffrant [phone]) on 10/26/2014 9:52:05 AM      CRITICAL  CARE Performed by: [name] [name] Total critical care time: 30 Critical care time was exclusive of separately billable procedures and treating other patients. Critical care was necessary to treat or prevent imminent or life-threatening deterioration. Critical care was time spent personally by me on the following activities: development of treatment plan with patient and/or surrogate as well as nursing, discussions with consultants, evaluation of patient's response to treatment, examination of patient, obtaining history from patient or surrogate, ordering and performing treatments and interventions, ordering and review of laboratory studies, ordering and review of radiographic studies, pulse oximetry and re-evaluation of patient's condition.     MDM   Final diagnoses:  SOB (shortness of breath)  Fever, unspecified fever cause  Hypotension, unspecified hypotension type   Patient presented with significant fever and oxygen requirement. Initially blood pressure was fine was not tachycardic and had no significant increased respiratory rate. Patient had the sort of pre-sepsis as a concern and had labs drawn for that. Lactic acid was not elevated. Chest x-ray and CT of the chest does not show evidence of pneumonia. Patient with recent diagnosis of stomach cancer. CT does show evidence of some metastatic disease in the lungs. Patient did have an appointment later today with hematology oncology. Patient will require admission. Patient eventually did develop some hypotension responded to 1 L fluid challenge and blood pressure came back up to 97 systolic. Patient was given broad-spectrum anabiotic to cover for the fever and probable infection. Urinalysis is still pending. Patient will be admitted by hospitalist. Blood culture sent.  I personally performed the services described in this documentation, which was scribed in my presence. The recorded information has been reviewed and is accurate.     [name] Sorrow, MD 10/26/14 1331

## 2014-10-27 ENCOUNTER — Encounter (HOSPITAL_COMMUNITY): Payer: Self-pay | Admitting: Oncology

## 2014-10-27 ENCOUNTER — Inpatient Hospital Stay (HOSPITAL_COMMUNITY): Payer: Medicare Other

## 2014-10-27 DIAGNOSIS — Z8572 Personal history of non-Hodgkin lymphomas: Secondary | ICD-10-CM

## 2014-10-27 DIAGNOSIS — R5081 Fever presenting with conditions classified elsewhere: Secondary | ICD-10-CM

## 2014-10-27 LAB — COMPREHENSIVE METABOLIC PANEL
ALK PHOS: 84 U/L (ref 38–126)
ALT: 94 U/L — ABNORMAL HIGH (ref 17–63)
AST: 119 U/L — ABNORMAL HIGH (ref 15–41)
Albumin: 2.7 g/dL — ABNORMAL LOW (ref 3.5–5.0)
Anion gap: 11 (ref 5–15)
BUN: 32 mg/dL — AB (ref 6–20)
CO2: 20 mmol/L — ABNORMAL LOW (ref 22–32)
Calcium: 7.6 mg/dL — ABNORMAL LOW (ref 8.9–10.3)
Chloride: 104 mmol/L (ref 101–111)
Creatinine, Ser: 1.72 mg/dL — ABNORMAL HIGH (ref 0.61–1.24)
GFR calc non Af Amer: 33 mL/min — ABNORMAL LOW (ref >60)
GFR, EST AFRICAN AMERICAN: 39 mL/min — AB (ref >60)
GLUCOSE: 83 mg/dL (ref 65–99)
Potassium: 3.8 mmol/L (ref 3.5–5.1)
SODIUM: 135 mmol/L (ref 135–145)
TOTAL PROTEIN: 5.4 g/dL — AB (ref 6.5–8.1)
Total Bilirubin: 3.2 mg/dL — ABNORMAL HIGH (ref 0.3–1.2)

## 2014-10-27 LAB — CBC
HEMATOCRIT: 32.6 % — AB (ref 39.0–52.0)
HEMOGLOBIN: 11 g/dL — AB (ref 13.0–17.0)
MCH: 29.6 pg (ref 26.0–34.0)
MCHC: 33.7 g/dL (ref 30.0–36.0)
MCV: 87.9 fL (ref 78.0–100.0)
Platelets: 102 10*3/uL — ABNORMAL LOW (ref 150–400)
RBC: 3.71 MIL/uL — AB (ref 4.22–5.81)
RDW: 15.8 % — ABNORMAL HIGH (ref 11.5–15.5)
WBC: 14.6 10*3/uL — AB (ref 4.0–10.5)

## 2014-10-27 NOTE — Progress Notes (Signed)
[facility] PROGRESS NOTE  [name] [name] WUJ:[id number] DOB: April 17, 1922 DOA: 10/26/2014 PCP: Walker Kehr, MD  Assessment/Plan: Principal Problem: Sepsis: gram neg bacteremia. Source uncertain. Urinalysis unremarkable chest x-ray inconclusive CT of the chest with multiple pulmonary nodules, not definite infiltrate some atelectasis. Some concern related to lymphoma of stomach. Improved this am. Hemodynamically stable.  Will obtain echocardiogram. Continue vanc and zosyn day #2. Active Problems:   Essential hypertension: hypotensive in ED. Improved with IV fluids. Stable this am. Holding home beta blocker. Monitor. Transfer to tele.     B-cell lymphoma of the stomach: Patient had EGD late July biopsies resulted in B-cell lymphoma. Spoke with Dr Whitney Muse who recommends transfer to [facility] for IP treatment if family/patient want to pursue treatment. Patient wishes to pursue treatment.   Pulmonary nodules: multiple per CT worrisome for metastasis. No definitive infiltrate. Will confirm patient wishes for goals of treatment.    CKD (chronic kidney disease): III. Urine output good. Stable at baseline.   Elevated transaminase: hx of primary biliary cirrhosis. Was started on Ursodiol per chart but that too expensive. Changed to chenodiol recently. See EGD report below.     Thrombocytopenia: May be related to acute illness in setting of cirrhosis.  No signs and symptoms of obvious bleeding.   Coronary atherosclerosis: No chest pain EKG without acute changes. Status post pacemaker most recently serviced in May of this year. Continue Lipitor   Chronic systolic heart failure: Not on any diuretics. Does not appear overloaded. He's on a beta blocker which I will hold as noted above.   Hyponatremia: resolved.   COPD bronchitis: Appears stable at baseline. Will continue his home inhalers.   PUD (peptic ulcer disease) recent EGD with four GASTRIC ulcers on the lesser curvature of the gastric  body and greater curvature gastric body. Recommending protonix and to COMPLETE THE CURRENT BOTTLE OF URSO. CONTINUE CHENODIOL 250 MG BID WITH A 90 DAY SUPPLY.STRICTLY AVOID ASPIRIN, AND NSAIDS FOR 3 MOS DUE TO PUD. USE TYLENOL AS NEEDED FOR PAIN. PROTONIX 30 MINUTES PRIOR TO FIRST MEAL DAILY. FOLLOW UP IN 3 MOS FOR EGD TO ASSESS HEALING. CONSIDER EUS. REPEAT LIVER PANEL IN 4 MOS.    Code Status: full Family Communication: family at bedside Disposition Plan: to be determined   Consultants:  Oncology Dr Whitney Muse  Procedures:  none  Antibiotics:  Vancomycin 10/26/14>>  Zosyn 10/26/14>>  HPI/Subjective: Sitting up eating breakfast. Reports feeling "better". Denies pain  Objective: Filed Vitals:   10/27/14 0800  BP: 127/48  Pulse: 63  Temp: 98.4 F (36.9 C)  Resp: 21    Intake/Output Summary (Last 24 hours) at 10/27/14 0919 Last data filed at 10/27/14 0800  Gross per 24 hour  Intake 4272.5 ml  Output   1101 ml  Net 3171.5 ml   Filed Weights   10/26/14 0945 10/26/14 1523 10/27/14 0500  Weight: 68.947 kg (152 lb) 67.9 kg (149 lb 11.1 oz) 70 kg (154 lb 5.2 oz)    Exam:   General:  Thin somewhat frail appearing but comfortable  Cardiovascular: RRR no MGR no LE edema  Respiratory: normal effort BS slightly distant but clear  Abdomen: soft non-distended non-tender +BS  Musculoskeletal: no clubbing no cyanosis   Data Reviewed: Basic Metabolic Panel:  Recent Labs Lab 10/26/14 1003 10/27/14 0415  NA 132* 135  K 4.3 3.8  CL 98* 104  CO2 22 20*  GLUCOSE 102* 83  BUN 34* 32*  CREATININE 1.71* 1.72*  CALCIUM 8.6* 7.6*  Liver Function Tests:  Recent Labs Lab 10/26/14 1003 10/27/14 0415  AST 222* 119*  ALT 135* 94*  ALKPHOS 152* 84  BILITOT 2.5* 3.2*  PROT 6.8 5.4*  ALBUMIN 3.6 2.7*    Recent Labs Lab 10/26/14 1003  LIPASE 86*   No results for input(s): AMMONIA in the last 168 hours. CBC:  Recent Labs Lab 10/26/14 1003 10/27/14 0415  WBC  14.5* 14.6*  NEUTROABS 12.9*  --   HGB 14.1 11.0*  HCT 40.9 32.6*  MCV 87.8 87.9  PLT 125* 102*   Cardiac Enzymes: No results for input(s): CKTOTAL, CKMB, CKMBINDEX, TROPONINI in the last 168 hours. BNP (last 3 results)  Recent Labs  10/26/14 1003  BNP 355.0*    ProBNP (last 3 results) No results for input(s): PROBNP in the last 8760 hours.  CBG: No results for input(s): GLUCAP in the last 168 hours.  Recent Results (from the past 240 hour(s))  Culture, blood (routine x 2)     Status: None (Preliminary result)   Collection Time: 10/26/14  9:55 AM  Result Value Ref Range Status   Specimen Description BLOOD RIGHT HAND DRAWN BY RN  Final   Special Requests   Final    BOTTLES DRAWN AEROBIC AND ANAEROBIC AEB=8CC ANA=6CC   Culture  Setup Time   Final    GRAM NEGATIVE RODS Gram Stain Report Called to,Read Back By and Verified With: LEE B AT 0513 ON [id number] BY FORSYTH K    Culture PENDING  Incomplete   Report Status PENDING  Incomplete  Culture, blood (routine x 2)     Status: None (Preliminary result)   Collection Time: 10/26/14 10:08 AM  Result Value Ref Range Status   Specimen Description BLOOD LEFT ANTECUBITAL  Final   Special Requests BOTTLES DRAWN AEROBIC AND ANAEROBIC 6CC EACH  Final   Culture  Setup Time   Final    GRAM NEGATIVE RODS Gram Stain Report Called to,Read Back By and Verified With: LEE B AT 0513 ON [id number] BY FORSYTH K    Culture PENDING  Incomplete   Report Status PENDING  Incomplete  MRSA PCR Screening     Status: None   Collection Time: 10/26/14  3:35 PM  Result Value Ref Range Status   MRSA by PCR NEGATIVE NEGATIVE Final    Comment:        The GeneXpert MRSA Assay (FDA approved for NASAL specimens only), is one component of a comprehensive MRSA colonization surveillance program. It is not intended to diagnose MRSA infection nor to guide or monitor treatment for MRSA infections.      Studies: Ct Chest Wo Contrast  10/26/2014   CLINICAL  DATA:  Awoke with chills and shortness of breath this morning at 0500 hours, hypoxemia, hypertension, was coronary disease, renal insufficiency, cirrhosis  EXAM: CT CHEST WITHOUT CONTRAST  TECHNIQUE: Multidetector CT imaging of the chest was performed following the standard protocol without IV contrast. Sagittal and coronal MPR images reconstructed from axial data set.  COMPARISON:  04/30/2014  FINDINGS: Dependent gallstones in gallbladder.  Large cyst at the upper poles of both kidneys again noted.  Cirrhotic appearing liver again identified.  Remaining visualized portion of upper abdomen grossly unremarkable.  Extensive atherosclerotic calcifications including coronary arteries.  AICD leads RIGHT atrium and RIGHT ventricle.  Upper normal sized RIGHT paratracheal node 10 mm short axis image 22.  No definite thoracic adenopathy.  Thickening of the wall of the mid to distal esophagus little changed.  Calcified pleural  plaques in the RIGHT hemi thorax but not definitely seen on LEFT.  Calcified granulomata RIGHT lower lobe.  11 x 8 mm RIGHT upper lobe pulmonary nodule image 20, new.  21 x 17 mm lobulated medial RIGHT lower lobe nodule image 44, new.  16 x 15 mm lobulated LEFT lower lobe nodule image 31, new.  Multiple additional new small BILATERAL pulmonary nodules.  Findings are highly concerning for pulmonary metastases.  Dependent atelectasis without definite infiltrate, pleural effusion or pneumothorax.  Diffuse osseous demineralization with multiple thoracic compression fractures, including a new fracture with interval vertebroplasty in the upper thoracic spine.  No definite osseous metastatic lesions.  IMPRESSION: Multiple new pulmonary nodules bilaterally highly worrisome for pulmonary metastatic disease.  Extensive atherosclerotic disease.  Cirrhotic liver.  Cholelithiasis.  Extensive renal cystic disease.  Diffuse wall thickening of the esophagus, nonspecific, can be seen with esophagitis and reflux disease  though tumor not entirely excluded; consider followup endoscopy when clinically appropriate.   Electronically Signed   By: Lavonia Dana M.D.   On: 10/26/2014 12:13   Dg Chest Portable 1 View  10/26/2014   CLINICAL DATA:  Awakened this morning with chills and shortness of breath, was physically active yesterday, history of coronary artery disease  EXAM: PORTABLE CHEST - 1 VIEW  COMPARISON:  PA and lateral chest x-ray of April 30, 2014  FINDINGS: The lungs are adequately inflated. The interstitial markings are increased bilaterally. There is a stable calcified 1.3 cm x 1 cm nodule in the right lower lung. The heart is normal in size. The pulmonary vascularity is not engorged. The AICD is in reasonable position. There is no pleural effusion. There is tortuosity of the descending thoracic aorta. The observed bony thorax exhibits no acute abnormality.  IMPRESSION: Increased interstitial densities bilaterally suggest acute bronchitis or early interstitial pneumonia. There is no definite pulmonary edema though early interstitial edema could be manifested in a similar fashion.  A PA and lateral chest x-ray would be useful if the patient can undergo the procedure.   Electronically Signed   By: David  Martinique M.D.   On: 10/26/2014 10:03    Scheduled Meds: . albuterol  2.5 mg Nebulization BID  . donepezil  10 mg Oral QHS  . fluticasone  2 spray Each Nare Daily  . heparin  5,000 Units Subcutaneous 3 times per day  . levothyroxine  100 mcg Oral QAC breakfast  . pantoprazole  40 mg Oral BID  . piperacillin-tazobactam (ZOSYN)  IV  3.375 g Intravenous Q8H  . vancomycin  750 mg Intravenous Q24H   Continuous Infusions: . sodium chloride 75 mL/hr at 10/27/14 0736    Principal Problem:   Sepsis Active Problems:   Hypothyroidism   Essential hypertension   Coronary atherosclerosis   Chronic systolic heart failure   Automatic implantable cardioverter-defibrillator in situ   Hyponatremia   COPD bronchitis    Cirrhosis   PUD (peptic ulcer disease)   CKD (chronic kidney disease)   Thrombocytopenia   Pulmonary nodules/lesions, multiple   Bacteremia    Time spent: 30 minutes    [facility] Pager [phone]. If 7PM-7AM, please contact night-coverage at [email or web address], password [facility] 10/27/2014, 9:19 AM  LOS: 1 day

## 2014-10-27 NOTE — Progress Notes (Addendum)
Dr Darrick Meigs updated on pt Blood cultures with gram- rods & aerobic/anerobic
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
## 2014-10-28 ENCOUNTER — Ambulatory Visit: Payer: Medicare Other

## 2014-10-28 LAB — CBC
HEMATOCRIT: 34.9 % — AB (ref 39.0–52.0)
Hemoglobin: 11.9 g/dL — ABNORMAL LOW (ref 13.0–17.0)
MCH: 30.2 pg (ref 26.0–34.0)
MCHC: 34.1 g/dL (ref 30.0–36.0)
MCV: 88.6 fL (ref 78.0–100.0)
Platelets: 105 10*3/uL — ABNORMAL LOW (ref 150–400)
RBC: 3.94 MIL/uL — ABNORMAL LOW (ref 4.22–5.81)
RDW: 16.1 % — AB (ref 11.5–15.5)
WBC: 10.2 10*3/uL (ref 4.0–10.5)

## 2014-10-28 LAB — BASIC METABOLIC PANEL
ANION GAP: 10 (ref 5–15)
BUN: 26 mg/dL — ABNORMAL HIGH (ref 6–20)
CHLORIDE: 106 mmol/L (ref 101–111)
CO2: 23 mmol/L (ref 22–32)
Calcium: 8 mg/dL — ABNORMAL LOW (ref 8.9–10.3)
Creatinine, Ser: 1.57 mg/dL — ABNORMAL HIGH (ref 0.61–1.24)
GFR calc Af Amer: 43 mL/min — ABNORMAL LOW (ref >60)
GFR calc non Af Amer: 37 mL/min — ABNORMAL LOW (ref >60)
GLUCOSE: 96 mg/dL (ref 65–99)
POTASSIUM: 4.2 mmol/L (ref 3.5–5.1)
Sodium: 139 mmol/L (ref 135–145)

## 2014-10-28 LAB — URINE CULTURE

## 2014-10-28 NOTE — Progress Notes (Signed)
[facility] PROGRESS NOTE  [name] GOT:[id number] DOB: 1922/04/21 DOA: 10/26/2014 PCP: Walker Kehr, MD  Assessment/Plan:  Gram negative bacteremia: afebrile and non-toxic appearing. Vanc and zosy day #3. Hemodynamically stable. Echo with estimated ejection fractionwas in the range of 30% to 35%. Diffuse hypokinesis and grade 2 diastolic dysfunction. Will change IV to [facility].   Active Problems: Sepsis gram neg bacteremia. Some concern related to lymphoma of stomach.  Hemodynamically stable. Continue vanc and zosyn day #3. Leukocytosis resolved. Max temp 99.2. Will mobilize   Essential hypertension: hypotensive in ED. SBP range 117-150. Will change IV to [facility].   Holding home beta blocker. Monitor.   B-cell lymphoma of the stomach: Patient had EGD late July biopsies resulted in B-cell lymphoma. Appreciate oncology assistance. Recommendations noted.   Chronic systolic heart failure: Not on any diuretics. Volume status +3L. Echo with EF 30-35% as noted above.  Holding home beta blocker as noted above. Will monitor closely and resume as indicated. Continue daily weight and intake and output.  Pulmonary nodules: multiple per CT worrisome for metastasis. No definitive infiltrate. Oxygen saturation level 98% on room air.   CKD (chronic kidney disease): III. Urine output good. Creatinine trending down. monitor.   Elevated transaminase: hx of primary biliary cirrhosis. Was started on Ursodiol per chart but that too expensive. Changed to chenodiol recently. See EGD report below.    Thrombocytopenia: May be related to acute illness in setting of cirrhosis. stable No signs and symptoms of obvious bleeding.   Coronary atherosclerosis: No chest pain EKG without acute changes. Status post pacemaker most recently serviced in May of this year. Continue Lipitor     Hyponatremia: resolved.   COPD bronchitis: remains stable at baseline. Will continue his home inhalers.   PUD  (peptic ulcer disease) recent EGD with four GASTRIC ulcers on the lesser curvature of the gastric body and greater curvature gastric body. Recommending protonix and to COMPLETE THE CURRENT BOTTLE OF URSO. CONTINUE CHENODIOL 250 MG BID WITH A 90 DAY SUPPLY.STRICTLY AVOID ASPIRIN, AND NSAIDS FOR 3 MOS DUE TO PUD. USE TYLENOL AS NEEDED FOR PAIN. PROTONIX 30 MINUTES PRIOR TO FIRST MEAL DAILY. FOLLOW UP IN 3 MOS FOR EGD TO ASSESS HEALING. CONSIDER EUS. REPEAT LIVER PANEL IN 4 MOS.   Code Status: full Family Communication: none present Disposition Plan: to [facility] on 10/31/14   Consultants:  oncology  Procedures:  Echo   Antibiotics:  Vancomycin 10/26/14>>  Zosyn 10/26/14>>  HPI/Subjective: Sitting up in bed eating breakfast. Reports sleeping well and feeling better  Objective: Filed Vitals:   10/28/14 0602  BP: 150/66  Pulse: 63  Temp: 98 F (36.7 C)  Resp: 20    Intake/Output Summary (Last 24 hours) at 10/28/14 0830 Last data filed at 10/28/14 5974  Gross per 24 hour  Intake 2210.42 ml  Output   1825 ml  Net 385.42 ml   Filed Weights   10/26/14 1523 10/27/14 0500 10/28/14 0602  Weight: 67.9 kg (149 lb 11.1 oz) 70 kg (154 lb 5.2 oz) 71 kg (156 lb 8.4 oz)    Exam:   General:  Well nourished appears comfortable  Cardiovascular: RRR no MGR No LE edema  Respiratory: normal effort BS clear bilaterally no crackles   Abdomen: flat soft +BS non-tender to palpation no guarding   Musculoskeletal: joints without swelling/erythema   Data Reviewed: Basic Metabolic Panel:  Recent Labs Lab 10/26/14 1003 10/27/14 0415 10/28/14 0546  NA 132* 135 139  K 4.3 3.8 4.2  CL 98* 104 106  CO2 22 20* 23  GLUCOSE 102* 83 96  BUN 34* 32* 26*  CREATININE 1.71* 1.72* 1.57*  CALCIUM 8.6* 7.6* 8.0*   Liver Function Tests:  Recent Labs Lab 10/26/14 1003 10/27/14 0415  AST 222* 119*  ALT 135* 94*  ALKPHOS 152* 84  BILITOT 2.5* 3.2*  PROT 6.8 5.4*  ALBUMIN 3.6 2.7*    Recent  Labs Lab 10/26/14 1003  LIPASE 86*   No results for input(s): AMMONIA in the last 168 hours. CBC:  Recent Labs Lab 10/26/14 1003 10/27/14 0415 10/28/14 0646  WBC 14.5* 14.6* 10.2  NEUTROABS 12.9*  --   --   HGB 14.1 11.0* 11.9*  HCT 40.9 32.6* 34.9*  MCV 87.8 87.9 88.6  PLT 125* 102* 105*   Cardiac Enzymes: No results for input(s): CKTOTAL, CKMB, CKMBINDEX, TROPONINI in the last 168 hours. BNP (last 3 results)  Recent Labs  10/26/14 1003  BNP 355.0*    ProBNP (last 3 results) No results for input(s): PROBNP in the last 8760 hours.  CBG: No results for input(s): GLUCAP in the last 168 hours.  Recent Results (from the past 240 hour(s))  Culture, blood (routine x 2)     Status: None (Preliminary result)   Collection Time: 10/26/14  9:55 AM  Result Value Ref Range Status   Specimen Description BLOOD RIGHT HAND DRAWN BY RN  Final   Special Requests   Final    BOTTLES DRAWN AEROBIC AND ANAEROBIC AEB=8CC ANA=6CC   Culture  Setup Time   Final    GRAM NEGATIVE RODS Gram Stain Report Called to,Read Back By and Verified With: LEE B AT 0513 ON [id number] BY FORSYTH K Performed at [place] 1 DAY  Final   Report Status PENDING  Incomplete  Culture, blood (routine x 2)     Status: None (Preliminary result)   Collection Time: 10/26/14 10:08 AM  Result Value Ref Range Status   Specimen Description BLOOD LEFT ANTECUBITAL  Final   Special Requests BOTTLES DRAWN AEROBIC AND ANAEROBIC [facility] EACH  Final   Culture  Setup Time   Final    GRAM NEGATIVE RODS Gram Stain Report Called to,Read Back By and Verified With: LEE B AT [place] ON [id number] BY FORSYTH K Performed at [facility]    Culture NO GROWTH 1 DAY  Final   Report Status PENDING  Incomplete  Urine culture     Status: None (Preliminary result)   Collection Time: 10/26/14 11:40 AM  Result Value Ref Range Status   Specimen Description URINE, CLEAN CATCH  Final   Special Requests NONE  Final    Culture   Final    CULTURE REINCUBATED FOR BETTER GROWTH Performed at [facility]    Report Status PENDING  Incomplete  MRSA PCR Screening     Status: None   Collection Time: 10/26/14  3:35 PM  Result Value Ref Range Status   MRSA by PCR NEGATIVE NEGATIVE Final    Comment:        The GeneXpert MRSA Assay (FDA approved for NASAL specimens only), is one component of a comprehensive MRSA colonization surveillance program. It is not intended to diagnose MRSA infection nor to guide or monitor treatment for MRSA infections.      Studies: Ct Chest Wo Contrast  10/26/2014   CLINICAL DATA:  Awoke with chills and shortness of breath this morning at 0500 hours, hypoxemia, hypertension, was  coronary disease, renal insufficiency, cirrhosis  EXAM: CT CHEST WITHOUT CONTRAST  TECHNIQUE: Multidetector CT imaging of the chest was performed following the standard protocol without IV contrast. Sagittal and coronal MPR images reconstructed from axial data set.  COMPARISON:  04/30/2014  FINDINGS: Dependent gallstones in gallbladder.  Large cyst at the upper poles of both kidneys again noted.  Cirrhotic appearing liver again identified.  Remaining visualized portion of upper abdomen grossly unremarkable.  Extensive atherosclerotic calcifications including coronary arteries.  AICD leads RIGHT atrium and RIGHT ventricle.  Upper normal sized RIGHT paratracheal node 10 mm short axis image 22.  No definite thoracic adenopathy.  Thickening of the wall of the mid to distal esophagus little changed.  Calcified pleural plaques in the RIGHT hemi thorax but not definitely seen on LEFT.  Calcified granulomata RIGHT lower lobe.  11 x 8 mm RIGHT upper lobe pulmonary nodule image 20, new.  21 x 17 mm lobulated medial RIGHT lower lobe nodule image 44, new.  16 x 15 mm lobulated LEFT lower lobe nodule image 31, new.  Multiple additional new small BILATERAL pulmonary nodules.  Findings are highly concerning for pulmonary  metastases.  Dependent atelectasis without definite infiltrate, pleural effusion or pneumothorax.  Diffuse osseous demineralization with multiple thoracic compression fractures, including a new fracture with interval vertebroplasty in the upper thoracic spine.  No definite osseous metastatic lesions.  IMPRESSION: Multiple new pulmonary nodules bilaterally highly worrisome for pulmonary metastatic disease.  Extensive atherosclerotic disease.  Cirrhotic liver.  Cholelithiasis.  Extensive renal cystic disease.  Diffuse wall thickening of the esophagus, nonspecific, can be seen with esophagitis and reflux disease though tumor not entirely excluded; consider followup endoscopy when clinically appropriate.   Electronically Signed   By: Lavonia Dana M.D.   On: 10/26/2014 12:13   Dg Chest Portable 1 View  10/26/2014   CLINICAL DATA:  Awakened this morning with chills and shortness of breath, was physically active yesterday, history of coronary artery disease  EXAM: PORTABLE CHEST - 1 VIEW  COMPARISON:  PA and lateral chest x-ray of April 30, 2014  FINDINGS: The lungs are adequately inflated. The interstitial markings are increased bilaterally. There is a stable calcified 1.3 cm x 1 cm nodule in the right lower lung. The heart is normal in size. The pulmonary vascularity is not engorged. The AICD is in reasonable position. There is no pleural effusion. There is tortuosity of the descending thoracic aorta. The observed bony thorax exhibits no acute abnormality.  IMPRESSION: Increased interstitial densities bilaterally suggest acute bronchitis or early interstitial pneumonia. There is no definite pulmonary edema though early interstitial edema could be manifested in a similar fashion.  A PA and lateral chest x-ray would be useful if the patient can undergo the procedure.   Electronically Signed   By: David  Martinique M.D.   On: 10/26/2014 10:03    Scheduled Meds: . albuterol  2.5 mg Nebulization BID  . donepezil  10 mg  Oral QHS  . fluticasone  2 spray Each Nare Daily  . heparin  5,000 Units Subcutaneous 3 times per day  . levothyroxine  100 mcg Oral QAC breakfast  . pantoprazole  40 mg Oral BID  . piperacillin-tazobactam (ZOSYN)  IV  3.375 g Intravenous Q8H   Continuous Infusions: . sodium chloride 50 mL/hr at 10/28/14 0451    Principal Problem:   Sepsis Active Problems:   Hypothyroidism   Essential hypertension   Coronary atherosclerosis   Chronic systolic heart failure   Automatic implantable  cardioverter-defibrillator in situ   Hyponatremia   COPD bronchitis   Cirrhosis   PUD (peptic ulcer disease)   CKD (chronic kidney disease)   Thrombocytopenia   Pulmonary nodules/lesions, multiple   Bacteremia   Diffuse large B cell lymphoma    Time spent: 35 minutes    [facility] Pager [phone] If 7PM-7AM, please contact night-coverage at [email or web address], password [facility] 10/28/2014, 8:30 AM  LOS: 2 days

## 2014-10-28 NOTE — Progress Notes (Signed)
PT Cancellation Note  Patient Details Name: [name] [name] MRN: [id number] DOB: 11-Jan-1923   Cancelled Treatment:    Reason Eval/Treat Not Completed: Other (comment).   There is a plan in place for pt to transfer to [facility] on 10-31-14.  Currently, CNAs are ambulating pt in the hallway.  It is probably premature to do an evaluation now and it would be best to have that done as he gets closer to discharge from there.  I will d/c him from service.  Please call me with any concerns.  Thanks!   Sable Feil  PT 10/28/2014, 12:48 PM [phone]

## 2014-10-28 NOTE — Progress Notes (Signed)
ANTIBIOTIC CONSULT NOTE - follow up  Pharmacy Consult for Vancomycin and Zosyn Indication: sepsis / chills / fever / GN bacteremia  Allergies  Allergen Reactions  . Aleve [Naproxen Sodium] Other (See Comments)    bleeding  . Ibuprofen Other (See Comments)    bleeding   Patient Measurements: Height: 5\' 4"  (162.6 cm) Weight: 156 lb 8.4 oz (71 kg) IBW/kg (Calculated) : 59.2  Vital Signs: Temp: 98 F (36.7 C) (08/05 0602) Temp Source: Oral (08/05 0602) BP: 150/66 mmHg (08/05 0602) Pulse Rate: 63 (08/05 0602) Intake/Output from previous day: 08/04 0701 - 08/05 0700 In: 2237.9 [P.O.:840; I.V.:1247.9; IV Piggyback:150] Out: 3329 [Urine:1825] Intake/Output from this shift: Total I/O In: 500 [P.O.:500] Out: -   Labs:  Recent Labs  10/26/14 1003 10/27/14 0415 10/28/14 0546 10/28/14 0646  WBC 14.5* 14.6*  --  10.2  HGB 14.1 11.0*  --  11.9*  PLT 125* 102*  --  105*  CREATININE 1.71* 1.72* 1.57*  --    Estimated Creatinine Clearance: 26.2 mL/min (by C-G formula based on Cr of 1.57). No results for input(s): VANCOTROUGH, VANCOPEAK, VANCORANDOM, GENTTROUGH, GENTPEAK, GENTRANDOM, TOBRATROUGH, TOBRAPEAK, TOBRARND, AMIKACINPEAK, AMIKACINTROU, AMIKACIN in the last 72 hours.   Microbiology: Recent Results (from the past 720 hour(s))  Culture, blood (routine x 2)     Status: None (Preliminary result)   Collection Time: 10/26/14  9:55 AM  Result Value Ref Range Status   Specimen Description BLOOD RIGHT HAND DRAWN BY RN  Final   Special Requests   Final    BOTTLES DRAWN AEROBIC AND ANAEROBIC AEB=8CC ANA=6CC   Culture  Setup Time   Final    GRAM NEGATIVE RODS Gram Stain Report Called to,Read Back By and Verified With: LEE B AT 5188 ON [id number] BY FORSYTH K Performed at [place]    Culture   Final    [place] Performed at [facility]    Report Status PENDING  Incomplete  Culture, blood (routine x 2)      Status: None (Preliminary result)   Collection Time: 10/26/14 10:08 AM  Result Value Ref Range Status   Specimen Description BLOOD LEFT ANTECUBITAL  Final   Special Requests BOTTLES DRAWN AEROBIC AND ANAEROBIC [facility] EACH  Final   Culture  Setup Time   Final    GRAM NEGATIVE RODS Gram Stain Report Called to,Read Back By and Verified With: LEE B AT [place] ON [id number] BY FORSYTH K Performed at [place]    Culture   Final    [place] Performed at [facility]    Report Status PENDING  Incomplete  Urine culture     Status: None (Preliminary result)   Collection Time: 10/26/14 11:40 AM  Result Value Ref Range Status   Specimen Description URINE, CLEAN CATCH  Final   Special Requests NONE  Final   Culture   Final    CULTURE REINCUBATED FOR BETTER GROWTH Performed at [facility]    Report Status PENDING  Incomplete  MRSA PCR Screening     Status: None   Collection Time: 10/26/14  3:35 PM  Result Value Ref Range Status   MRSA by PCR NEGATIVE NEGATIVE Final    Comment:        The GeneXpert MRSA Assay (FDA approved for NASAL specimens only), is one component of a comprehensive MRSA colonization surveillance program. It is not intended to  diagnose MRSA infection nor to guide or monitor treatment for MRSA infections.    Medical History: Past Medical History  Diagnosis Date  . Hypertension   . Hypothyroid   . CAD (coronary artery disease)     Dr. Lovena Le  . Osteoarthritis   . BPH (benign prostatic hypertrophy)     Dr. Terance Hart  . Osteoporosis     (took actonel x 5y)  . Renal insufficiency     Dr. Armstead Peaks  . Hip fx     x 2 on R.  . Sternum fx 2011  . Urinary incontinence   . Hearing loss   . Seizures   . Cancer     B cell lymphoma stomach  . Diffuse large B cell lymphoma 10/27/2014   Vancomycin and Zosyn (received initial doses in ED) Vancomycin 8/3 >>8/4 Zosyn 8/3 >>  Assessment: 81yo male  with PMH that includes hypertension, primary biliary cirrhosis, hypothyroidism, CAD dose post pacemaker , chronic kidney disease most recently B-cell lymphoma of the stomach presents to the emergency department with the chief complaint of severe generalized weakness and chills. Initial evaluation in the emergency department reveals sepsis of unknown source with fever hypotension, leukocytosis.  Blood cx now with GNR, MD stopped Vancomycin. Afebrile, WBC normalized. SCr improved.  Goal of Therapy:  Eradicate infection.  Plan:  Continue Zosyn 3.375gm IV q8h, each dose over 4 hrs Monitor labs, renal fxn, and c/s - f/u speciation of GNR, narrow abx as appropriate  Hart Robinsons A 10/28/2014,11:02 AM

## 2014-10-29 DIAGNOSIS — Z95810 Presence of automatic (implantable) cardiac defibrillator: Secondary | ICD-10-CM

## 2014-10-29 DIAGNOSIS — C8330 Diffuse large B-cell lymphoma, unspecified site: Secondary | ICD-10-CM

## 2014-10-29 LAB — CULTURE, BLOOD (ROUTINE X 2)

## 2014-10-29 LAB — BASIC METABOLIC PANEL
Anion gap: 10 (ref 5–15)
BUN: 21 mg/dL — AB (ref 6–20)
CHLORIDE: 104 mmol/L (ref 101–111)
CO2: 22 mmol/L (ref 22–32)
Calcium: 8.4 mg/dL — ABNORMAL LOW (ref 8.9–10.3)
Creatinine, Ser: 1.53 mg/dL — ABNORMAL HIGH (ref 0.61–1.24)
GFR calc Af Amer: 44 mL/min — ABNORMAL LOW (ref >60)
GFR calc non Af Amer: 38 mL/min — ABNORMAL LOW (ref >60)
Glucose, Bld: 91 mg/dL (ref 65–99)
POTASSIUM: 4 mmol/L (ref 3.5–5.1)
SODIUM: 136 mmol/L (ref 135–145)

## 2014-10-29 LAB — CBC
HCT: 33.1 % — ABNORMAL LOW (ref 39.0–52.0)
Hemoglobin: 11.3 g/dL — ABNORMAL LOW (ref 13.0–17.0)
MCH: 30 pg (ref 26.0–34.0)
MCHC: 34.1 g/dL (ref 30.0–36.0)
MCV: 87.8 fL (ref 78.0–100.0)
Platelets: 103 10*3/uL — ABNORMAL LOW (ref 150–400)
RBC: 3.77 MIL/uL — ABNORMAL LOW (ref 4.22–5.81)
RDW: 16.1 % — AB (ref 11.5–15.5)
WBC: 9 10*3/uL (ref 4.0–10.5)

## 2014-10-29 LAB — T4, FREE: Free T4: 1.15 ng/dL — ABNORMAL HIGH (ref 0.61–1.12)

## 2014-10-29 MED ORDER — CEFAZOLIN SODIUM-DEXTROSE 2-3 GM-% IV SOLR
2.0000 g | Freq: Two times a day (BID) | INTRAVENOUS | Status: DC
  Administered 2014-10-29 – 2014-11-02 (×8): 2 g via INTRAVENOUS
  Filled 2014-10-29 (×12): qty 50

## 2014-10-29 NOTE — Progress Notes (Signed)
[facility] PROGRESS NOTE  [name] [name]:[id number] DOB: 10/06/1922    PCP:   Walker Kehr, MD   HPI:   Admitted for sepsis, now [facility] grew Klebsiella  He did not require pressor, so no stress dose steroid given. It is unlikely that he had perforated viscous given no significant abdominal pain. He is significantly better. Orgininally on IV Van/Zosyn, then [employer], and with Klebsiella Oxytoca, Dr Will Bonnet of [facility] recommended IV Ancef.  He will receive IV antibiotic until Monday, then plan was to see if  [facility] can evaluate for chemotherapy for his gastric lymphoma.  TSH was 11 noted.   Rewiew of Systems:  Constitutional: Negative for malaise, fever and chills. No significant weight loss or weight gain Eyes: Negative for eye pain, redness and discharge, diplopia, visual changes, or flashes of light. ENMT: Negative for ear pain, hoarseness, nasal congestion, sinus pressure and sore throat. No headaches; tinnitus, drooling, or problem swallowing. Cardiovascular: Negative for chest pain, palpitations, diaphoresis, dyspnea and peripheral edema. ; No orthopnea, PND Respiratory: Negative for cough, hemoptysis, wheezing and stridor. No pleuritic chestpain. Gastrointestinal: Negative for nausea, vomiting, diarrhea, constipation, abdominal pain, melena, blood in stool, hematemesis, jaundice and rectal bleeding.    Genitourinary: Negative for frequency, dysuria, incontinence,flank pain and hematuria; Musculoskeletal: Negative for back pain and neck pain. Negative for swelling and trauma.;  Skin: . Negative for pruritus, rash, abrasions, bruising and skin lesion.; ulcerations Neuro: Negative for headache, lightheadedness and neck stiffness. Negative for weakness, altered level of consciousness , altered mental status, extremity weakness, burning feet, involuntary movement, seizure and syncope.  Psych: negative for anxiety, depression, insomnia, tearfulness, panic attacks, hallucinations,  paranoia, suicidal or homicidal ideation    Past Medical History  Diagnosis Date  . Hypertension   . Hypothyroid   . CAD (coronary artery disease)     Dr. Lovena   . Osteoarthritis   . BPH (benign prostatic hypertrophy)     Dr. Terance Hart  . Osteoporosis     (took actonel x 5y)  . Renal insufficiency     Dr. Armstead Peaks  . Hip fx     x 2 on R.  . Sternum fx 2011  . Urinary incontinence   . Hearing loss   . Seizures   . Cancer     B cell lymphoma stomach  . 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 Heart disease      permanent pacemaker  . Appendectomy    . Inguinal hernia repair    . Hip fracture surgery  2011    ORIF  R.  . Pacemaker insertion    . Colonoscopy  12/1998, 02/2004    diverticulosois, external hemorrhoids  . Esophagogastroduodenoscopy  06/17/2014    Dr. Oneida Alar: 1. Patent stricture at the gastroesophageal junction 2. UGIB Due to multiple  gastric ulcers 3. Moderate Duodentitis. Negative H.pylori  . Esophagogastroduodenoscopy N/A 10/17/2014    Procedure: ESOPHAGOGASTRODUODENOSCOPY (EGD);  Surgeon: Danie Binder, MD;  Location: [facility];  Service: Endoscopy;  Laterality: N/A;  1030    Medications:  HOME MEDS: Prior to Admission medications   Medication Sig Start Date End Date Taking? Authorizing Provider  albuterol (PROVENTIL) (2.5 MG/3ML) 0.083% nebulizer solution USE ONE VIAL VIA NEBULIZER FOUR TIMES DAILY AS NEEDED. Patient taking differently: Take 2.5 mg by nebulization 2 (two) times daily. USE ONE VIAL VIA NEBULIZER FOUR TIMES DAILY AS NEEDED. For shortness of breath or wheezing. 02/04/14  Yes Aleksei Plotnikov V, MD  Ascorbic Acid (VITAMIN C PO) Take 1 tablet by mouth daily.   Yes Historical Provider, MD  atorvastatin (LIPITOR) 20 MG tablet TAKE 1/2 TABLET  TWICE DAILY 07/26/14  Yes Aleksei Plotnikov V, MD  Cholecalciferol (VITAMIN D3) 1000 UNITS tablet Take 1,000 Units by mouth daily.     Yes  Historical Provider, MD  diltiazem 2 % GEL Apply 1 application topically 3 (three) times daily. Patient taking differently: Apply 1 application topically 3 (three) times daily as needed.  02/01/14  Yes Aleksei Plotnikov V, MD  donepezil (ARICEPT) 10 MG tablet TAKE 1 TABLET AT BEDTIME 05/10/14  Yes Aleksei Plotnikov V, MD  fluticasone (FLONASE) 50 MCG/ACT nasal spray Place 2 sprays into both nostrils daily. 05/26/14  Yes Aleksei Plotnikov V, MD  folic acid (FOLVITE) 024 MCG tablet Take 800 mcg by mouth daily.    Yes Historical Provider, MD  levothyroxine (SYNTHROID, LEVOTHROID) 100 MCG tablet Take 1 tablet (100 mcg total) by mouth daily. 09/27/14  Yes Aleksei Plotnikov V, MD  metoprolol succinate (TOPROL-XL) 25 MG 24 hr tablet Take 0.5 tablets (12.5 mg total) by mouth 2 (two) times daily. 05/26/14  Yes Aleksei Plotnikov V, MD  nitroGLYCERIN (NITROSTAT) 0.4 MG SL tablet Place 1 tablet (0.4 mg total) under the tongue every 5 (five) minutes as needed. Chest pain 07/26/14 07/24/16 Yes Aleksei Plotnikov V, MD  pantoprazole (PROTONIX) 40 MG tablet Take 1 tablet (40 mg total) by mouth daily. Patient taking differently: Take 40 mg by mouth 2 (two) times daily.  09/20/14  Yes Orvil Feil, NP  triamcinolone (NASACORT) 55 MCG/ACT nasal inhaler Place 1 spray into the nose daily. 05/01/12  Yes Aleksei Plotnikov V, MD  ursodiol (ACTIGALL) 500 MG tablet Take 1 tablet by mouth 2 (two) times daily. 09/20/14  Yes Historical Provider, MD     Allergies:  Allergies  Allergen Reactions  . Aleve [Naproxen Sodium] Other (See Comments)    bleeding  . Ibuprofen Other (See Comments)    bleeding    Social History:   reports that he quit smoking about 38 years ago. He has never used smokeless tobacco. He reports that he does not drink alcohol or use illicit drugs.  Family History: Family History  Problem Relation Age of Onset  . Coronary artery disease      male 1st degree relative<60  . Hypertension    . Cancer Sister       Physical Exam: Filed Vitals:   10/28/14 1937 10/28/14 2253 10/29/14 0533 10/29/14 0749  BP:  150/67 144/66   Pulse:  78 59   Temp:  98.7 F (37.1 C) 98.6 F (37 C)   TempSrc:  Oral Oral   Resp:  20 20   Height:      Weight:   68.402 kg (150 lb 12.8 oz)   SpO2: 96% 96% 97% 97%   Blood pressure 144/66, pulse 59, temperature 98.6 F (37 C), temperature source Oral, resp. rate 20, height 5\' 4"  (1.626 m), weight 68.402 kg (150 lb 12.8 oz), SpO2 97 %.  GEN:  Pleasant  patient lying in the stretcher in no acute distress; cooperative with exam. PSYCH:  alert and oriented x4; does not appear anxious or depressed; affect is appropriate. HEENT: Mucous membranes pink and anicteric; PERRLA; EOM intact; no cervical lymphadenopathy nor thyromegaly or carotid bruit; no JVD; There were no stridor. Neck is very supple. Breasts:: Not examined CHEST WALL: No tenderness CHEST: Normal respiration, clear to auscultation  bilaterally.  HEART: Regular rate and rhythm.  There are no murmur, rub, or gallops.   BACK: No kyphosis or scoliosis; no CVA tenderness ABDOMEN: soft and non-tender; no masses, no organomegaly, normal abdominal bowel sounds; no pannus; no intertriginous candida. There is no rebound and no distention. Rectal Exam: Not done EXTREMITIES: No bone or joint deformity; age-appropriate arthropathy of the hands and knees; no edema; no ulcerations.  There is no calf tenderness. Genitalia: not examined PULSES: 2+ and symmetric SKIN: Normal hydration no rash or ulceration CNS: Cranial nerves 2-12 grossly intact no focal lateralizing neurologic deficit.  Speech is fluent; uvula elevated with phonation, facial symmetry and tongue midline. DTR are normal bilaterally, cerebella exam is intact, barbinski is negative and strengths are equaled bilaterally.  No sensory loss.   Labs on Admission:  Basic Metabolic Panel:  Recent Labs Lab 10/26/14 1003 10/27/14 0415 10/28/14 0546 10/29/14 0624   NA 132* 135 139 136  K 4.3 3.8 4.2 4.0  CL 98* 104 106 104  CO2 22 20* 23 22  GLUCOSE 102* 83 96 91  BUN 34* 32* 26* 21*  CREATININE 1.71* 1.72* 1.57* 1.53*  CALCIUM 8.6* 7.6* 8.0* 8.4*   Liver Function Tests:  Recent Labs Lab 10/26/14 1003 10/27/14 0415  AST 222* 119*  ALT 135* 94*  ALKPHOS 152* 84  BILITOT 2.5* 3.2*  PROT 6.8 5.4*  ALBUMIN 3.6 2.7*    Recent Labs Lab 10/26/14 1003  LIPASE 86*   No results for input(s): AMMONIA in the last 168 hours. CBC:  Recent Labs Lab 10/26/14 1003 10/27/14 0415 10/28/14 0646 10/29/14 0624  WBC 14.5* 14.6* 10.2 9.0  NEUTROABS 12.9*  --   --   --   HGB 14.1 11.0* 11.9* 11.3*  HCT 40.9 32.6* 34.9* 33.1*  MCV 87.8 87.9 88.6 87.8  PLT 125* 102* 105* 103*   Assessment/Plan Present on Admission:  . Sepsis . Essential hypertension . Coronary atherosclerosis . Chronic systolic heart failure . Automatic implantable cardioverter-defibrillator in situ . COPD bronchitis . PUD (peptic ulcer disease) . Hyponatremia . CKD (chronic kidney disease) . Thrombocytopenia . Hypothyroidism . Cirrhosis . Pulmonary nodules/lesions, multiple . Bacteremia  PLAN: Klebsiella Oxytoca Sepsis:  Source uncertain. Urinalysis unremarkable chest x-ray inconclusive CT of the chest with multiple pulmonary nodules, not definite infiltrate some atelectasis. Some concern related to lymphoma of stomach. Improved this am. Hemodynamically stable. Will obtain echocardiogram  Will change antibiotic to IV Ancef as per ID.    Essential hypertension: hypotensive in ED. Improved with IV fluids. Stable this am. Holding home beta blocker. Monitor. Transfer to tele.   B-cell lymphoma of the stomach: Patient had EGD late July biopsies resulted in B-cell lymphoma. Spoke with Dr Whitney Muse who recommends transfer to [facility] for IP treatment if family/patient want to pursue treatment. Patient wishes to pursue treatment.  Tentative plan for Monday.  Arrangement will be  made by Heme/Onc.   Pulmonary nodules: multiple per CT worrisome for metastasis. No definitive infiltrate. Will confirm patient wishes for goals of treatment.   CKD (chronic kidney disease): III. Urine output good. Stable at baseline.   Elevated transaminase: hx of primary biliary cirrhosis. Was started on Ursodiol per chart but that too expensive. Changed to chenodiol recently. See EGD report below.    Thrombocytopenia: May be related to acute illness in setting of cirrhosis. No signs and symptoms of obvious bleeding.   Coronary atherosclerosis: No chest pain EKG without acute changes. Status post pacemaker most recently serviced in May  of this year. Continue Lipitor   Chronic systolic heart failure: Not on any diuretics. Does not appear overloaded. He's on a beta blocker which I will hold as noted above.   Hyponatremia: resolved.   COPD bronchitis: Appears stable at baseline. Will continue his home inhalers.   PUD (peptic ulcer disease) recent EGD with four GASTRIC ulcers on the lesser curvature of the gastric body and greater curvature gastric body. Recommending protonix and to COMPLETE THE CURRENT BOTTLE OF URSO. CONTINUE CHENODIOL 250 MG BID WITH A 90 DAY SUPPLY.STRICTLY AVOID ASPIRIN, AND NSAIDS FOR 3 MOS DUE TO PUD. USE TYLENOL AS NEEDED FOR PAIN. PROTONIX 30 MINUTES PRIOR TO FIRST MEAL DAILY. FOLLOW UP IN 3 MOS FOR EGD TO ASSESS HEALING. CONSIDER EUS. REPEAT LIVER PANEL IN 4 MOS.   Hypothyroidism:  Currently on Synthroid 156mcg.  Will check T4 as TSH was slightly increase during sickness.   Code Status: full  Other plans as per orders.  Orvan Falconer, MD. [facility] Pager [phone] 7pm to 7am.  10/29/2014, 11:58 AM

## 2014-10-30 NOTE — Progress Notes (Signed)
[facility] PROGRESS NOTE  [name] [name] MGQ:[id number] DOB: 12/04/1922    PCP:   Walker Kehr, MD  Admitted for sepsis, now [facility] grew Klebsiella He did not require pressor, so no stress dose steroid given. It is unlikely that he had perforated viscous given no significant abdominal pain. He is significantly better. Orgininally on IV Van/Zosyn, then [employer], and with Klebsiella Oxytoca, Dr Will Bonnet of [facility] recommended IV Ancef. He will receive IV antibiotic until Monday, then plan was to see if [facility] can evaluate for chemotherapy for his gastric lymphoma. TSH was 11 noted, T4 is not low.   Rewiew of Systems:  Constitutional: Negative for malaise, fever and chills. No significant weight loss or weight gain Eyes: Negative for eye pain, redness and discharge, diplopia, visual changes, or flashes of light. ENMT: Negative for ear pain, hoarseness, nasal congestion, sinus pressure and sore throat. No headaches; tinnitus, drooling, or problem swallowing. Cardiovascular: Negative for chest pain, palpitations, diaphoresis, dyspnea and peripheral edema. ; No orthopnea, PND Respiratory: Negative for cough, hemoptysis, wheezing and stridor. No pleuritic chestpain. Gastrointestinal: Negative for nausea, vomiting, diarrhea, constipation, abdominal pain, melena, blood in stool, hematemesis, jaundice and rectal bleeding.    Genitourinary: Negative for frequency, dysuria, incontinence,flank pain and hematuria; Musculoskeletal: Negative for back pain and neck pain. Negative for swelling and trauma.;  Skin: . Negative for pruritus, rash, abrasions, bruising and skin lesion.; ulcerations Neuro: Negative for headache, lightheadedness and neck stiffness. Negative for weakness, altered level of consciousness , altered mental status, extremity weakness, burning feet, involuntary movement, seizure and syncope.  Psych: negative for anxiety, depression, insomnia, tearfulness, panic attacks, hallucinations,  paranoia, suicidal or homicidal ideation    Past Medical History  Diagnosis Date  . Hypertension   . Hypothyroid   . CAD (coronary artery disease)     Dr. Lovena   . Osteoarthritis   . BPH (benign prostatic hypertrophy)     Dr. Terance Hart  . Osteoporosis     (took actonel x 5y)  . Renal insufficiency     Dr. Armstead Peaks  . Hip fx     x 2 on R.  . Sternum fx 2011  . Urinary incontinence   . Hearing loss   . Seizures   . Cancer     B cell lymphoma stomach  . 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 Heart disease      permanent pacemaker  . Appendectomy    . Inguinal hernia repair    . Hip fracture surgery  2011    ORIF  R.  . Pacemaker insertion    . Colonoscopy  12/1998, 02/2004    diverticulosois, external hemorrhoids  . Esophagogastroduodenoscopy  06/17/2014    Dr. Oneida Alar: 1. Patent stricture at the gastroesophageal junction 2. UGIB Due to multiple  gastric ulcers 3. Moderate Duodentitis. Negative H.pylori  . Esophagogastroduodenoscopy N/A 10/17/2014    Procedure: ESOPHAGOGASTRODUODENOSCOPY (EGD);  Surgeon: Danie Binder, MD;  Location: [facility];  Service: Endoscopy;  Laterality: N/A;  1030    Medications:  HOME MEDS: Prior to Admission medications   Medication Sig Start Date End Date Taking? Authorizing Provider  albuterol (PROVENTIL) (2.5 MG/3ML) 0.083% nebulizer solution USE ONE VIAL VIA NEBULIZER FOUR TIMES DAILY AS NEEDED. Patient taking differently: Take 2.5 mg by nebulization 2 (two) times daily. USE ONE VIAL VIA NEBULIZER FOUR TIMES DAILY AS NEEDED. For shortness of breath or wheezing. 02/04/14  Yes Aleksei Plotnikov  V, MD  Ascorbic Acid (VITAMIN C PO) Take 1 tablet by mouth daily.   Yes Historical Provider, MD  atorvastatin (LIPITOR) 20 MG tablet TAKE 1/2 TABLET  TWICE DAILY 07/26/14  Yes Aleksei Plotnikov V, MD  Cholecalciferol (VITAMIN D3) 1000 UNITS tablet Take 1,000 Units by mouth daily.     Yes  Historical Provider, MD  diltiazem 2 % GEL Apply 1 application topically 3 (three) times daily. Patient taking differently: Apply 1 application topically 3 (three) times daily as needed.  02/01/14  Yes Aleksei Plotnikov V, MD  donepezil (ARICEPT) 10 MG tablet TAKE 1 TABLET AT BEDTIME 05/10/14  Yes Aleksei Plotnikov V, MD  fluticasone (FLONASE) 50 MCG/ACT nasal spray Place 2 sprays into both nostrils daily. 05/26/14  Yes Aleksei Plotnikov V, MD  folic acid (FOLVITE) 970 MCG tablet Take 800 mcg by mouth daily.    Yes Historical Provider, MD  levothyroxine (SYNTHROID, LEVOTHROID) 100 MCG tablet Take 1 tablet (100 mcg total) by mouth daily. 09/27/14  Yes Aleksei Plotnikov V, MD  metoprolol succinate (TOPROL-XL) 25 MG 24 hr tablet Take 0.5 tablets (12.5 mg total) by mouth 2 (two) times daily. 05/26/14  Yes Aleksei Plotnikov V, MD  nitroGLYCERIN (NITROSTAT) 0.4 MG SL tablet Place 1 tablet (0.4 mg total) under the tongue every 5 (five) minutes as needed. Chest pain 07/26/14 07/24/16 Yes Aleksei Plotnikov V, MD  pantoprazole (PROTONIX) 40 MG tablet Take 1 tablet (40 mg total) by mouth daily. Patient taking differently: Take 40 mg by mouth 2 (two) times daily.  09/20/14  Yes Orvil Feil, NP  triamcinolone (NASACORT) 55 MCG/ACT nasal inhaler Place 1 spray into the nose daily. 05/01/12  Yes Aleksei Plotnikov V, MD  ursodiol (ACTIGALL) 500 MG tablet Take 1 tablet by mouth 2 (two) times daily. 09/20/14  Yes Historical Provider, MD     Allergies:  Allergies  Allergen Reactions  . Aleve [Naproxen Sodium] Other (See Comments)    bleeding  . Ibuprofen Other (See Comments)    bleeding    Social History:   reports that he quit smoking about 38 years ago. He has never used smokeless tobacco. He reports that he does not drink alcohol or use illicit drugs.  Family History: Family History  Problem Relation Age of Onset  . Coronary artery disease      male 1st degree relative<60  . Hypertension    . Cancer Sister       Physical Exam: Filed Vitals:   10/29/14 2113 10/30/14 0524 10/30/14 0726 10/30/14 1436  BP: 137/63 167/74  147/61  Pulse: 73 69  62  Temp: 98.7 F (37.1 C) 98.1 F (36.7 C)  98.4 F (36.9 C)  TempSrc: Oral Oral  Oral  Resp: 20 20  20   Height:      Weight:  67.858 kg (149 lb 9.6 oz)    SpO2: 93% 96% 96% 98%   Blood pressure 147/61, pulse 62, temperature 98.4 F (36.9 C), temperature source Oral, resp. rate 20, height 5\' 4"  (1.626 m), weight 67.858 kg (149 lb 9.6 oz), SpO2 98 %.  GEN:  Pleasant patient lying in the stretcher in no acute distress; cooperative with exam. PSYCH:  alert and oriented x4; does not appear anxious or depressed; affect is appropriate. HEENT: Mucous membranes pink and anicteric; PERRLA; EOM intact; no cervical lymphadenopathy nor thyromegaly or carotid bruit; no JVD; There were no stridor. Neck is very supple. Breasts:: Not examined CHEST WALL: No tenderness CHEST: Normal respiration, clear to auscultation bilaterally.  HEART: Regular rate and rhythm.  There are no murmur, rub, or gallops.   BACK: No kyphosis or scoliosis; no CVA tenderness ABDOMEN: soft and non-tender; no masses, no organomegaly, normal abdominal bowel sounds; no pannus; no intertriginous candida. There is no rebound and no distention. Rectal Exam: Not done EXTREMITIES: No bone or joint deformity; age-appropriate arthropathy of the hands and knees; no edema; no ulcerations.  There is no calf tenderness. Genitalia: not examined PULSES: 2+ and symmetric SKIN: Normal hydration no rash or ulceration CNS: Cranial nerves 2-12 grossly intact no focal lateralizing neurologic deficit.  Speech is fluent; uvula elevated with phonation, facial symmetry and tongue midline. DTR are normal bilaterally, cerebella exam is intact, barbinski is negative and strengths are equaled bilaterally.  No sensory loss.   Labs on Admission:  Basic Metabolic Panel:  Recent Labs Lab 10/26/14 1003 10/27/14 0415  10/28/14 0546 10/29/14 0624  NA 132* 135 139 136  K 4.3 3.8 4.2 4.0  CL 98* 104 106 104  CO2 22 20* 23 22  GLUCOSE 102* 83 96 91  BUN 34* 32* 26* 21*  CREATININE 1.71* 1.72* 1.57* 1.53*  CALCIUM 8.6* 7.6* 8.0* 8.4*   Liver Function Tests:  Recent Labs Lab 10/26/14 1003 10/27/14 0415  AST 222* 119*  ALT 135* 94*  ALKPHOS 152* 84  BILITOT 2.5* 3.2*  PROT 6.8 5.4*  ALBUMIN 3.6 2.7*    Recent Labs Lab 10/26/14 1003  LIPASE 86*   No results for input(s): AMMONIA in the last 168 hours. CBC:  Recent Labs Lab 10/26/14 1003 10/27/14 0415 10/28/14 0646 10/29/14 0624  WBC 14.5* 14.6* 10.2 9.0  NEUTROABS 12.9*  --   --   --   HGB 14.1 11.0* 11.9* 11.3*  HCT 40.9 32.6* 34.9* 33.1*  MCV 87.8 87.9 88.6 87.8  PLT 125* 102* 105* 103*   Assessment/Plan Present on Admission:  . Sepsis . Essential hypertension . Coronary atherosclerosis . Chronic systolic heart failure . Automatic implantable cardioverter-defibrillator in situ . COPD bronchitis . PUD (peptic ulcer disease) . Hyponatremia . CKD (chronic kidney disease) . Thrombocytopenia . Hypothyroidism . Cirrhosis . Pulmonary nodules/lesions, multiple . Bacteremia  PLAN:  Klebsiella Oxytoca Sepsis: Source uncertain. Urinalysis unremarkable chest x-ray inconclusive CT of the chest with multiple pulmonary nodules, not definite infiltrate some atelectasis. Some concern related to lymphoma of stomach. Hemodynamically stable. Will obtain echocardiogram Will change antibiotic to IV Ancef as per ID.    Essential hypertension: hypotensive in ED. Improved with IV fluids. Stable this am. Holding home beta blocker. Monitor. Transfer to tele.   B-cell lymphoma of the stomach: Patient had EGD late July biopsies resulted in B-cell lymphoma. Spoke with Dr Whitney Muse who recommends transfer to [facility] for IP treatment if family/patient want to pursue treatment. Patient wishes to pursue treatment. Tentative plan for Monday.  Arrangement will be made by Heme/Onc.   Pulmonary nodules: multiple per CT worrisome for metastasis. No definitive infiltrate. Will confirm patient wishes for goals of treatment.   CKD (chronic kidney disease): III. Urine output good. Stable at baseline.   Elevated transaminase: hx of primary biliary cirrhosis. Was started on Ursodiol per chart but that too expensive. Changed to chenodiol recently. See EGD report below.    Thrombocytopenia: May be related to acute illness in setting of cirrhosis. No signs and symptoms of obvious bleeding.   Coronary atherosclerosis: No chest pain EKG without acute changes. Status post pacemaker most recently serviced in May of this year. Continue Lipitor   Chronic  systolic heart failure: Not on any diuretics. Does not appear overloaded. He's on a beta blocker which I will hold as noted above.   Hyponatremia: resolved.   COPD bronchitis: Appears stable at baseline. Will continue his home inhalers.   PUD (peptic ulcer disease) recent EGD with four GASTRIC ulcers on the lesser curvature of the gastric body and greater curvature gastric body. Recommending protonix and to COMPLETE THE CURRENT BOTTLE OF URSO. CONTINUE CHENODIOL 250 MG BID WITH A 90 DAY SUPPLY.STRICTLY AVOID ASPIRIN, AND NSAIDS FOR 3 MOS DUE TO PUD. USE TYLENOL AS NEEDED FOR PAIN. PROTONIX 30 MINUTES PRIOR TO FIRST MEAL DAILY. FOLLOW UP IN 3 MOS FOR EGD TO ASSESS HEALING. CONSIDER EUS. REPEAT LIVER PANEL IN 4 MOS.  Hypothyroidism: Currently on Synthroid 183mcg.  TSH was slightly increase during sickness, but free T4 was not elevated.   Other plans as per orders.  Code Status: FULL Haskel Khan, MD. [facility] Pager [phone] 7pm to 7am.  10/30/2014, 3:14 PM

## 2014-10-31 ENCOUNTER — Inpatient Hospital Stay (HOSPITAL_COMMUNITY): Payer: Medicare Other

## 2014-10-31 DIAGNOSIS — R531 Weakness: Secondary | ICD-10-CM

## 2014-10-31 DIAGNOSIS — I959 Hypotension, unspecified: Secondary | ICD-10-CM

## 2014-10-31 DIAGNOSIS — N184 Chronic kidney disease, stage 4 (severe): Secondary | ICD-10-CM

## 2014-10-31 DIAGNOSIS — R509 Fever, unspecified: Secondary | ICD-10-CM

## 2014-10-31 DIAGNOSIS — Z950 Presence of cardiac pacemaker: Secondary | ICD-10-CM

## 2014-10-31 DIAGNOSIS — K7460 Unspecified cirrhosis of liver: Secondary | ICD-10-CM

## 2014-10-31 DIAGNOSIS — I429 Cardiomyopathy, unspecified: Secondary | ICD-10-CM

## 2014-10-31 DIAGNOSIS — C8339 Diffuse large B-cell lymphoma, extranodal and solid organ sites: Secondary | ICD-10-CM

## 2014-10-31 DIAGNOSIS — R918 Other nonspecific abnormal finding of lung field: Secondary | ICD-10-CM

## 2014-10-31 DIAGNOSIS — R7881 Bacteremia: Secondary | ICD-10-CM

## 2014-10-31 LAB — BASIC METABOLIC PANEL
Anion gap: 10 (ref 5–15)
BUN: 24 mg/dL — AB (ref 6–20)
CO2: 25 mmol/L (ref 22–32)
CREATININE: 1.46 mg/dL — AB (ref 0.61–1.24)
Calcium: 8.7 mg/dL — ABNORMAL LOW (ref 8.9–10.3)
Chloride: 100 mmol/L — ABNORMAL LOW (ref 101–111)
GFR calc Af Amer: 47 mL/min — ABNORMAL LOW (ref >60)
GFR calc non Af Amer: 41 mL/min — ABNORMAL LOW (ref >60)
GLUCOSE: 89 mg/dL (ref 65–99)
Potassium: 3.9 mmol/L (ref 3.5–5.1)
Sodium: 135 mmol/L (ref 135–145)

## 2014-10-31 LAB — CBC
HCT: 35.4 % — ABNORMAL LOW (ref 39.0–52.0)
Hemoglobin: 12 g/dL — ABNORMAL LOW (ref 13.0–17.0)
MCH: 29.7 pg (ref 26.0–34.0)
MCHC: 33.9 g/dL (ref 30.0–36.0)
MCV: 87.6 fL (ref 78.0–100.0)
PLATELETS: 145 10*3/uL — AB (ref 150–400)
RBC: 4.04 MIL/uL — ABNORMAL LOW (ref 4.22–5.81)
RDW: 16.5 % — ABNORMAL HIGH (ref 11.5–15.5)
WBC: 11.1 10*3/uL — AB (ref 4.0–10.5)

## 2014-10-31 MED ORDER — IOHEXOL 300 MG/ML  SOLN
25.0000 mL | INTRAMUSCULAR | Status: AC
  Administered 2014-10-31 (×2): 25 mL via ORAL

## 2014-10-31 NOTE — Progress Notes (Signed)
[facility] PROGRESS NOTE  [name] [name] NWG:[id number] DOB: Aug 22, 1922 DOA: 10/26/2014 PCP: Walker Kehr, MD  Assessment/Plan: Gram negative bacteremia: afebrile and non-toxic appearing. Vanc and zosy day #3. Hemodynamically stable. Echo with estimated ejection fractionwas in the range of 30% to 35%. Diffuse hypokinesis and grade 2 diastolic dysfunction. Will change IV to [facility].   Active Problems: Sepsis gram neg bacteremia. Some concern related to lymphoma of stomach. Hemodynamically stable.originally provided with  vanc and zosyn then vanc discontinued and with Klebsiella ocytoca ID recommended Ancef. Continue ancef. He remains afebrile and non-toxic appearing. Leukocytosis trending up. Await evaluation of oncology for possible transfer to [facility] for first chemo treatment. Defer to oncology   Essential hypertension: controlled. Home BB on hold.  Monitor.   B-cell lymphoma of the stomach: Patient had EGD late July biopsies resulted in B-cell lymphoma. Will be evaluated by oncology for transfer to [facility] for first treatment IP.  Appreciate oncology assistance. Recommendations noted.   Chronic systolic heart failure: Not on any diuretics. Volume status +2L. Echo with EF 30-35% as noted above. Holding home beta blocker as noted above. .  Pulmonary nodules: multiple per CT worrisome for metastasis. No definitive infiltrate. Oxygen saturation level 98% on room air.   CKD (chronic kidney disease): III. Urine output good. Creatinine continues to trend down.    Elevated transaminase: hx of primary biliary cirrhosis. Was started on Ursodiol per chart but that too expensive. Changed to chenodiol recently. See EGD report below.    Thrombocytopenia: May be related to acute illness in setting of cirrhosis. Improving.  No signs and symptoms of obvious bleeding.   Coronary atherosclerosis: No chest pain EKG without acute changes. Status post pacemaker most recently serviced in May of this year.  Continue Lipitor     Hyponatremia: resolved.   COPD bronchitis: remains stable at baseline. Will continue his home inhalers.   PUD (peptic ulcer disease) recent EGD with four GASTRIC ulcers on the lesser curvature of the gastric body and greater curvature gastric body. Recommending protonix and to COMPLETE THE CURRENT BOTTLE OF URSO. CONTINUE CHENODIOL 250 MG BID WITH A 90 DAY SUPPLY.STRICTLY AVOID ASPIRIN, AND NSAIDS FOR 3 MOS DUE TO PUD. USE TYLENOL AS NEEDED FOR PAIN. PROTONIX 30 MINUTES PRIOR TO FIRST MEAL DAILY. FOLLOW UP IN 3 MOS FOR EGD TO ASSESS HEALING. CONSIDER EUS. REPEAT LIVER PANEL IN 4 MOS.    Code Status: full Family Communication: daughter at bedside Disposition Plan: to be determined   Consultants:  oncology  Procedures:  none  Antibiotics:  Vancomycin 10/26/14>>10/29/14  Zosyn 10/26/14>>11/08/14  anceph 10/29/14>>   HPI/Subjective: Sitting up in bed reading paper. Denies pain discomfort. Reports sleeping well  Objective: Filed Vitals:   10/31/14 0620  BP: 130/71  Pulse: 64  Temp: 98.2 F (36.8 C)  Resp: 20    Intake/Output Summary (Last 24 hours) at 10/31/14 1044 Last data filed at 10/31/14 5784  Gross per 24 hour  Intake  903.5 ml  Output   1600 ml  Net -696.5 ml   Filed Weights   10/29/14 0533 10/30/14 0524 10/31/14 0620  Weight: 68.402 kg (150 lb 12.8 oz) 67.858 kg (149 lb 9.6 oz) 67.087 kg (147 lb 14.4 oz)    Exam:   General:  Well nourished  Cardiovascular: RRR no MGR No LE  Respiratory: normal effort BS clear bilateral no wheeze  Abdomen: non distended non tender +BS   Musculoskeletal: no clubbing or cyanosis   Data Reviewed: Basic Metabolic Panel:  Recent  Labs Lab 10/26/14 1003 10/27/14 0415 10/28/14 0546 10/29/14 0624 10/31/14 0600  NA 132* 135 139 136 135  K 4.3 3.8 4.2 4.0 3.9  CL 98* 104 106 104 100*  CO2 22 20* 23 22 25   GLUCOSE 102* 83 96 91 89  BUN 34* 32* 26* 21* 24*  CREATININE 1.71* 1.72* 1.57*  1.53* 1.46*  CALCIUM 8.6* 7.6* 8.0* 8.4* 8.7*   Liver Function Tests:  Recent Labs Lab 10/26/14 1003 10/27/14 0415  AST 222* 119*  ALT 135* 94*  ALKPHOS 152* 84  BILITOT 2.5* 3.2*  PROT 6.8 5.4*  ALBUMIN 3.6 2.7*    Recent Labs Lab 10/26/14 1003  LIPASE 86*   No results for input(s): AMMONIA in the last 168 hours. CBC:  Recent Labs Lab 10/26/14 1003 10/27/14 0415 10/28/14 0646 10/29/14 0624 10/31/14 0600  WBC 14.5* 14.6* 10.2 9.0 11.1*  NEUTROABS 12.9*  --   --   --   --   HGB 14.1 11.0* 11.9* 11.3* 12.0*  HCT 40.9 32.6* 34.9* 33.1* 35.4*  MCV 87.8 87.9 88.6 87.8 87.6  PLT 125* 102* 105* 103* 145*   Cardiac Enzymes: No results for input(s): CKTOTAL, CKMB, CKMBINDEX, TROPONINI in the last 168 hours. BNP (last 3 results)  Recent Labs  10/26/14 1003  BNP 355.0*    ProBNP (last 3 results) No results for input(s): PROBNP in the last 8760 hours.  CBG: No results for input(s): GLUCAP in the last 168 hours.  Recent Results (from the past 240 hour(s))  Culture, blood (routine x 2)     Status: None   Collection Time: 10/26/14  9:55 AM  Result Value Ref Range Status   Specimen Description BLOOD RIGHT HAND DRAWN BY RN  Final   Special Requests   Final    BOTTLES DRAWN AEROBIC AND ANAEROBIC AEB=8CC ANA=6CC   Culture  Setup Time   Final    GRAM NEGATIVE RODS Gram Stain Report Called to,Read Back By and Verified With: LEE B AT 0513 ON [id number] BY Mikel Cella K Performed at [place]    Culture   Final    KLEBSIELLA OXYTOCA SUSCEPTIBILITIES PERFORMED ON PREVIOUS CULTURE WITHIN THE LAST 5 DAYS. Performed at [facility]    Report Status 10/29/2014 FINAL  Final  Culture, blood (routine x 2)     Status: None   Collection Time: 10/26/14 10:08 AM  Result Value Ref Range Status   Specimen Description BLOOD LEFT ANTECUBITAL  Final   Special Requests BOTTLES DRAWN AEROBIC AND ANAEROBIC [facility] EACH  Final   Culture   Setup Time   Final    GRAM NEGATIVE RODS Gram Stain Report Called to,Read Back By and Verified With: LEE B AT 0513 ON [id number] BY FORSYTH K Performed at [place]    Culture   Final    KLEBSIELLA OXYTOCA Performed at [facility]    Report Status 10/29/2014 FINAL  Final   Organism ID, Bacteria KLEBSIELLA OXYTOCA  Final      Susceptibility   Klebsiella oxytoca - MIC*    AMPICILLIN 4 RESISTANT Resistant     CEFAZOLIN <=4 SENSITIVE Sensitive     CEFEPIME <=1 SENSITIVE Sensitive     CEFTAZIDIME <=1 SENSITIVE Sensitive     CEFTRIAXONE <=1 SENSITIVE Sensitive     CIPROFLOXACIN <=0.25 SENSITIVE Sensitive     GENTAMICIN <=1 SENSITIVE Sensitive     IMIPENEM <=0.25 SENSITIVE Sensitive  TRIMETH/SULFA <=20 SENSITIVE Sensitive     AMPICILLIN/SULBACTAM <=2 SENSITIVE Sensitive     PIP/TAZO <=4 SENSITIVE Sensitive     * KLEBSIELLA OXYTOCA  Urine culture     Status: None   Collection Time: 10/26/14 11:40 AM  Result Value Ref Range Status   Specimen Description URINE, CLEAN CATCH  Final   Special Requests NONE  Final   Culture   Final    80,000 COLONIES/ml MULTIPLE SPECIES PRESENT, SUGGEST RECOLLECTION Performed at [facility]    Report Status 10/28/2014 FINAL  Final  MRSA PCR Screening     Status: None   Collection Time: 10/26/14  3:35 PM  Result Value Ref Range Status   MRSA by PCR NEGATIVE NEGATIVE Final    Comment:        The GeneXpert MRSA Assay (FDA approved for NASAL specimens only), is one component of a comprehensive MRSA colonization surveillance program. It is not intended to diagnose MRSA infection nor to guide or monitor treatment for MRSA infections.      Studies: No results found.  Scheduled Meds: . albuterol  2.5 mg Nebulization BID  .  ceFAZolin (ANCEF) IV  2 g Intravenous Q12H  . donepezil  10 mg Oral QHS  . fluticasone  2 spray Each Nare Daily  . heparin  5,000 Units Subcutaneous 3 times per  day  . levothyroxine  100 mcg Oral QAC breakfast  . pantoprazole  40 mg Oral BID   Continuous Infusions: . sodium chloride 10 mL/hr at 10/28/14 6378    Principal Problem:   Sepsis Active Problems:   Hypothyroidism   Essential hypertension   Coronary atherosclerosis   Chronic systolic heart failure   Automatic implantable cardioverter-defibrillator in situ   Hyponatremia   COPD bronchitis   Cirrhosis   PUD (peptic ulcer disease)   CKD (chronic kidney disease)   Thrombocytopenia   Pulmonary nodules/lesions, multiple   Bacteremia   Diffuse large B cell lymphoma    Time spent: 30 minutes    [place] [facility] Pager [phone]. If 7PM-7AM, please contact night-coverage at [email or web address], password [facility] 10/31/2014, 10:44 AM  LOS: 5 days

## 2014-10-31 NOTE — Care Management Important Message (Signed)
Important Message  Patient Details  Name: [name] [name] MRN: [id number] Date of Birth: [birth year]   Medicare Important Message Given:  Yes-second notification given    Sherald Barge, RN 10/31/2014, 4:10 PM
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
## 2014-10-31 NOTE — Plan of Care (Signed)
Problem: Phase III Progression Outcomes Goal: Activity at appropriate level-compared to baseline (UP IN CHAIR FOR HEMODIALYSIS)  Outcome: Completed/Met Date Met:  10/31/14 Out of bed to chair this morning.Marland KitchenMarland Kitchen

## 2014-10-31 NOTE — Progress Notes (Signed)
Patient being transferred to [facility] for further treatment, report called,and given to Roseanne Kaufman RN. Vital signs stable. No c/o pain or discomfort noted. Transported via [facility] to awaiting facility.Marland KitchenMarland Kitchen

## 2014-10-31 NOTE — Care Management Note (Signed)
Case Management Note  Patient Details  Name: [name] [name] MRN: [id number] Date of Birth: 1923/01/14  Subjective/Objective: 81 y/o m admitted w/sepsis. BF:XOVANVBT. From home.                   Action/Plan: d/c plan home.   Expected Discharge Date:                 Expected Discharge Plan:  [place]  In-House Referral:     Discharge planning Services  CM Consult  Post Acute Care Choice:    Choice offered to:     DME Arranged:    DME Agency:     HH Arranged:    [place] Agency:     Status of Service:  In process, will continue to follow  Medicare Important Message Given:    Date Medicare IM Given:    Medicare IM give by:    Date Additional Medicare IM Given:    Additional Medicare Important Message give by:     If discussed at [place] of Stay Meetings, dates discussed:    Additional Comments:  Dessa Phi, RN 10/31/2014, 2:44 PM

## 2014-10-31 NOTE — Progress Notes (Signed)
Chart reviewed.  No issues noted over the weekend.  Case discussed with Dyanne Carrel, NP (Hospitalist).  We recommend hospitalist-to-hospitalist transfer to [facility] with Oncology consultation with Dr. 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 Oneida Alar.  See onlcology consult note from 10/27/2014 for more details. Complicating issues include:  Gram Neg sepsis, TMAX24 hr 98.53F.  On Ancef currently. Chronic systolic heart failure, LVEF 30-35% CKD, Stage III Transaminitis Cirrhosis of liver Thrombocytopenia  Due to risk of gastric perforation with response to therapy, which is typically quick, patient would be better served in [place] for initial treatment.  Subsequent treatments can be administered at [facility].  Patient and plan discussed with Dr. Ancil Linsey and she is in agreement with the aforementioned.   KEFALAS,THOMAS 10/31/2014 11:13 AM

## 2014-10-31 NOTE — Progress Notes (Signed)
Patient ambulated in hallway today without difficulty. Will continue to monitor patient.Marland Kitchen

## 2014-11-01 ENCOUNTER — Other Ambulatory Visit: Payer: Self-pay | Admitting: Hematology and Oncology

## 2014-11-01 DIAGNOSIS — I10 Essential (primary) hypertension: Secondary | ICD-10-CM

## 2014-11-01 DIAGNOSIS — D696 Thrombocytopenia, unspecified: Secondary | ICD-10-CM

## 2014-11-01 DIAGNOSIS — C8330 Diffuse large B-cell lymphoma, unspecified site: Secondary | ICD-10-CM

## 2014-11-01 LAB — CBC
HCT: 30.5 % — ABNORMAL LOW (ref 39.0–52.0)
Hemoglobin: 10.5 g/dL — ABNORMAL LOW (ref 13.0–17.0)
MCH: 30 pg (ref 26.0–34.0)
MCHC: 34.4 g/dL (ref 30.0–36.0)
MCV: 87.1 fL (ref 78.0–100.0)
Platelets: 144 10*3/uL — ABNORMAL LOW (ref 150–400)
RBC: 3.5 MIL/uL — AB (ref 4.22–5.81)
RDW: 16.8 % — AB (ref 11.5–15.5)
WBC: 10.8 10*3/uL — ABNORMAL HIGH (ref 4.0–10.5)

## 2014-11-01 LAB — HEPATIC FUNCTION PANEL
ALT: 18 U/L (ref 17–63)
AST: 40 U/L (ref 15–41)
Albumin: 2.7 g/dL — ABNORMAL LOW (ref 3.5–5.0)
Alkaline Phosphatase: 195 U/L — ABNORMAL HIGH (ref 38–126)
BILIRUBIN DIRECT: 0.3 mg/dL (ref 0.1–0.5)
BILIRUBIN TOTAL: 0.8 mg/dL (ref 0.3–1.2)
Indirect Bilirubin: 0.5 mg/dL (ref 0.3–0.9)
TOTAL PROTEIN: 5.8 g/dL — AB (ref 6.5–8.1)

## 2014-11-01 LAB — BASIC METABOLIC PANEL
Anion gap: 7 (ref 5–15)
BUN: 32 mg/dL — AB (ref 6–20)
CALCIUM: 8.4 mg/dL — AB (ref 8.9–10.3)
CO2: 23 mmol/L (ref 22–32)
Chloride: 103 mmol/L (ref 101–111)
Creatinine, Ser: 1.59 mg/dL — ABNORMAL HIGH (ref 0.61–1.24)
GFR, EST AFRICAN AMERICAN: 42 mL/min — AB (ref >60)
GFR, EST NON AFRICAN AMERICAN: 37 mL/min — AB (ref >60)
Glucose, Bld: 107 mg/dL — ABNORMAL HIGH (ref 65–99)
Potassium: 3.9 mmol/L (ref 3.5–5.1)
Sodium: 133 mmol/L — ABNORMAL LOW (ref 135–145)

## 2014-11-01 MED ORDER — FUROSEMIDE 20 MG PO TABS
20.0000 mg | ORAL_TABLET | Freq: Once | ORAL | Status: AC
  Administered 2014-11-01: 20 mg via ORAL
  Filled 2014-11-01: qty 1

## 2014-11-01 NOTE — Progress Notes (Signed)
Report called to med/surg spoke to [employer]

## 2014-11-01 NOTE — Evaluation (Signed)
Physical Therapy One Time Evaluation Patient Details Name: [name] [name] MRN: [id number] DOB: [birth year] Today's Date: 11/01/2014   History of Present Illness  81 year old male with past medical history of hypertension, primary biliary cirrhosis, chronic kidney disease, recently diagnosed by EGD on July with biopsy showing B-cell lymphoma of the stomach admitted for Sepsis due to Klebsiella bacteremia:  Clinical Impression  Patient evaluated by Physical Therapy with no further acute PT needs identified. All education has been completed and the patient has no further questions.  Pt tolerating ambulation very well and appears at his baseline.  Pt reports recent discharge from [place] and still has HEP.  Recommended pt ambulate with staff during acute stay and continue HEP upon d/c.  Also reviewed seated marching, LAQ and ankle pumps x20 during acute stay. See below for any follow-up Physical Therapy or equipment needs. PT is signing off. Thank you for this referral.     Follow Up Recommendations No PT follow up    Equipment Recommendations  None recommended by PT    Recommendations for Other Services       Precautions / Restrictions Precautions Precautions: Fall      Mobility  Bed Mobility               General bed mobility comments: pt up in recliner on arrival  Transfers Overall transfer level: Needs assistance Equipment used: None Transfers: Sit to/from Stand Sit to Stand: Supervision            Ambulation/Gait Ambulation/Gait assistance: Supervision;Min guard Ambulation Distance (Feet): 220 Feet Assistive device: None Gait Pattern/deviations: Step-through pattern;Decreased stride length     General Gait Details: short steps, pushed IV pole 110 feet then ambulated back to room without UE support, no LOB, pt reports feeling at his baseline  Stairs            Wheelchair Mobility    Modified Rankin (Stroke Patients Only)       Balance                                             Pertinent Vitals/Pain Pain Assessment: No/denies pain    Home Living Family/patient expects to be discharged to:: Private residence Living Arrangements: Alone Available Help at Discharge: Family Type of Home: House Home Access: Stairs to enter   Technical brewer of Steps: 4 Home Layout: One level Home Equipment: Environmental consultant - 2 wheels;Walker - 4 wheels;Shower seat;Cane - single point;Wheelchair - power      Prior Function Level of Independence: Independent               Hand Dominance        Extremity/Trunk Assessment               Lower Extremity Assessment: Overall WFL for tasks assessed      Cervical / Trunk Assessment: Kyphotic  Communication   Communication: HOH  Cognition Arousal/Alertness: Awake/alert Behavior During Therapy: WFL for tasks assessed/performed Overall Cognitive Status: Within Functional Limits for tasks assessed                      General Comments      Exercises        Assessment/Plan    PT Assessment Patent does not need any further PT services  PT Diagnosis     PT Problem List  PT Treatment Interventions     PT Goals (Current goals can be found in the Care Plan section) Acute Rehab PT Goals PT Goal Formulation: All assessment and education complete, DC therapy    Frequency     Barriers to discharge        Co-evaluation               End of Session   Activity Tolerance: Patient tolerated treatment well Patient left: in chair;with call bell/phone within reach;with chair alarm set Nurse Communication: Mobility status         Time: 0352-4818 PT Time Calculation (min) (ACUTE ONLY): 11 min   Charges:   PT Evaluation $Initial PT Evaluation Tier I: 1 Procedure     PT G Codes:        Oluwaseun Cremer,KATHrine E 11/01/2014, 12:53 PM Carmelia Bake, PT, DPT 11/01/2014 Pager: [phone]

## 2014-11-01 NOTE — Progress Notes (Signed)
[facility] PROGRESS NOTE Interim History: 81 year old male with past medical history of hypertension primary biliary cirrhosis chronic kidney disease recently diagnosed by EGD on July with biopsy showing B-cell lymphoma of the stomach came to the hospital with weakness and chills. Started empirically on vancomycin and Zosyn for sepsis, blood cultures on 10/26/2014 showed Klebsiella oxytoca sensitive to Ancef which he was D escalated to. Transferred to [facility] for chemotherapy and the high risk of perforation with treatment.   Assessment/Plan: Sepsis due to Klebsiella bacteremia: -  Started empirically on Vanc. and zosyn blood cultures on 8.3.2016 showed K. Oxytoca.  - de-escalated to ancef IV. Leukocytosis has plateau. - Repeat blood cultures on 10/27/2014 have remained negative till date.  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 - Oncology recommendations. It is to note that he has an active infection.  Chronic systolic heart failure: - Has positive hepatojugular reflux, he is not short of breath, he relates and when he lays down in the bed feels like he can't catch his breath. - I will start him on a single dose of oral Lasix low dose limit his fluid intake.  Thrombocytopenia: -  Multifactorial due to cirrhosis and sepsis.  Primary biliary cirrhosis: Hold Chenodiol.  Chronic kidney disease stage III:  Code Status: full DVT Prophylaxis: Family Communication: none present Disposition Plan: unknown  Consultants:  oncology  Procedures:  CT abd and pelvis  CT chest  Echo  Antibiotics:  Ancef  HPI/Subjective: No complaints feels great.  Objective: Filed Vitals:   10/31/14 2002 10/31/14 2029 11/01/14 0424 11/01/14 0746  BP:  123/57 114/50   Pulse:  71 61   Temp:   97.7 F (36.5 C) 98.3 F (36.8 C)   TempSrc:  Axillary Oral   Resp:  18 17   Height:      Weight:      SpO2: 96% 97% 95% 92%    Intake/Output Summary (Last 24 hours) at 11/01/14 0819 Last data filed at 11/01/14 0700  Gross per 24 hour  Intake  346.5 ml  Output    700 ml  Net -353.5 ml   Filed Weights   10/29/14 0533 10/30/14 0524 10/31/14 0620  Weight: 68.402 kg (150 lb 12.8 oz) 67.858 kg (149 lb 9.6 oz) 67.087 kg (147 lb 14.4 oz)    Exam:  General: Alert, awake, oriented x3, in no acute distress.  HEENT: No bruits, no goiter. +HJ reflex Heart: Regular rate and rhythm. Lungs: Good air movement, clear Abdomen: Soft, nontender, nondistended, positive bowel sounds.  Neuro: Grossly intact, nonfocal.   Data Reviewed: Basic Metabolic Panel:  Recent Labs Lab 10/27/14 0415 10/28/14 0546 10/29/14 0624 10/31/14 0600 11/01/14 0514  NA 135 139 136 135 133*  K 3.8 4.2 4.0 3.9 3.9  CL 104 106 104 100* 103  CO2 20* 23 22 25 23   GLUCOSE 83 96 91 89 107*  BUN 32* 26* 21* 24* 32*  CREATININE 1.72* 1.57* 1.53* 1.46* 1.59*  CALCIUM 7.6* 8.0* 8.4* 8.7* 8.4*   Liver Function Tests:  Recent Labs Lab 10/26/14 1003 10/27/14 0415 11/01/14 0514  AST 222* 119* 40  ALT 135* 94* 18  ALKPHOS 152* 84 195*  BILITOT 2.5* 3.2* 0.8  PROT 6.8 5.4* 5.8*  ALBUMIN 3.6 2.7* 2.7*    Recent Labs Lab 10/26/14 1003  LIPASE 86*  No results for input(s): AMMONIA in the last 168 hours. CBC:  Recent Labs Lab 10/26/14 1003 10/27/14 0415 10/28/14 0646 10/29/14 0624 10/31/14 0600 11/01/14 0514  WBC 14.5* 14.6* 10.2 9.0 11.1* 10.8*  NEUTROABS 12.9*  --   --   --   --   --   HGB 14.1 11.0* 11.9* 11.3* 12.0* 10.5*  HCT 40.9 32.6* 34.9* 33.1* 35.4* 30.5*  MCV 87.8 87.9 88.6 87.8 87.6 87.1  PLT 125* 102* 105* 103* 145* 144*   Cardiac Enzymes: No results for input(s): CKTOTAL, CKMB, CKMBINDEX, TROPONINI in the last 168 hours. BNP (last 3 results)  Recent Labs  10/26/14 1003    BNP 355.0*    ProBNP (last 3 results) No results for input(s): PROBNP in the last 8760 hours.  CBG: No results for input(s): GLUCAP in the last 168 hours.  Recent Results (from the past 240 hour(s))  Culture, blood (routine x 2)     Status: None   Collection Time: 10/26/14  9:55 AM  Result Value Ref Range Status   Specimen Description BLOOD RIGHT HAND DRAWN BY RN  Final   Special Requests   Final    BOTTLES DRAWN AEROBIC AND ANAEROBIC AEB=8CC ANA=6CC   Culture  Setup Time   Final    GRAM NEGATIVE RODS Gram Stain Report Called to,Read Back By and Verified With: LEE B AT 0513 ON [id number] BY Mikel Cella K Performed at [place]    Culture   Final    KLEBSIELLA OXYTOCA SUSCEPTIBILITIES PERFORMED ON PREVIOUS CULTURE WITHIN THE LAST 5 DAYS. Performed at [facility]    Report Status 10/29/2014 FINAL  Final  Culture, blood (routine x 2)     Status: None   Collection Time: 10/26/14 10:08 AM  Result Value Ref Range Status   Specimen Description BLOOD LEFT ANTECUBITAL  Final   Special Requests BOTTLES DRAWN AEROBIC AND ANAEROBIC [facility] EACH  Final   Culture  Setup Time   Final    GRAM NEGATIVE RODS Gram Stain Report Called to,Read Back By and Verified With: LEE B AT [place] ON [id number] BY FORSYTH K Performed at [place]    Culture   Final    KLEBSIELLA OXYTOCA Performed at [facility]    Report Status 10/29/2014 FINAL  Final   Organism ID, Bacteria KLEBSIELLA OXYTOCA  Final      Susceptibility   Klebsiella oxytoca - MIC*    AMPICILLIN 4 RESISTANT Resistant     CEFAZOLIN <=4 SENSITIVE Sensitive     CEFEPIME <=1 SENSITIVE Sensitive     CEFTAZIDIME <=1 SENSITIVE Sensitive     CEFTRIAXONE <=1 SENSITIVE Sensitive     CIPROFLOXACIN <=0.25 SENSITIVE Sensitive     GENTAMICIN <=1 SENSITIVE Sensitive     IMIPENEM <=0.25 SENSITIVE Sensitive     TRIMETH/SULFA <=20 SENSITIVE Sensitive      AMPICILLIN/SULBACTAM <=2 SENSITIVE Sensitive     PIP/TAZO <=4 SENSITIVE Sensitive     * KLEBSIELLA OXYTOCA  Urine culture     Status: None   Collection Time: 10/26/14 11:40 AM  Result Value Ref Range Status   Specimen Description URINE, CLEAN CATCH  Final   Special Requests NONE  Final   Culture   Final    80,000 COLONIES/ml MULTIPLE SPECIES PRESENT, SUGGEST RECOLLECTION Performed at [facility]    Report Status 10/28/2014 FINAL  Final  MRSA PCR Screening  Status: None   Collection Time: 10/26/14  3:35 PM  Result Value Ref Range Status   MRSA by PCR NEGATIVE NEGATIVE Final    Comment:        The GeneXpert MRSA Assay (FDA approved for NASAL specimens only), is one component of a comprehensive MRSA colonization surveillance program. It is not intended to diagnose MRSA infection nor to guide or monitor treatment for MRSA infections.      Studies: Ct Abdomen Pelvis Wo Contrast  10/31/2014   CLINICAL DATA:  Fever and leukocytosis.  EXAM: CT ABDOMEN AND PELVIS WITHOUT CONTRAST  TECHNIQUE: Multidetector CT imaging of the abdomen and pelvis was performed following the standard protocol without IV contrast. Oral contrast was administered.  COMPARISON:  April 30, 2014  FINDINGS: There are bilateral pleural effusions. There is bibasilar atelectatic change. Pacemaker leads attached to the right heart. Pericardium is not thickened.  The liver has a nodular contour consistent with cirrhosis. No focal liver lesions are identified on this noncontrast enhanced study. There is cholelithiasis. Gallbladder wall is not appreciably thickened. There is no biliary duct dilatation. There are stable periportal lymph nodes, likely due to cirrhosis.  Spleen appears normal. Pancreas is somewhat atrophic. A 1 cm cyst in the body of the pancreas appear stable. No new pancreatic lesion is identified.  Adrenals appear normal.  There is extensive cystic change arising from both kidneys, with several  cysts on the right showing areas of rim-like calcification, stable. There is no hydronephrosis on either side. There is a tiny presumed hyperdense cyst in the mid left kidney. There is no renal or ureteral calculus on either side.  In the pelvis, the urinary bladder is midline with wall thickness within normal limits. There is a diverticulum arising from the posterior rightward aspect of the urinary bladder measuring 3.4 x 2.3 cm, stable. There is fat in each inguinal ring. There is scarring in the right inguinal ring, stable. There is a total hip prosthesis on the right. There is no pelvic mass or pelvic fluid collection. There is a transurethral prostate resection defect. There are multiple sigmoid diverticula without diverticulitis. Appendix absent.  There is no bowel obstruction. No free air or portal venous air. There appears to be thickening of the gastric wall diffusely, a finding also present on prior study. There is no ascites or abscess in the an or pelvis. There is no lymph node prominence beyond the periportal lymph node prominence described above. There is extensive atherosclerotic change in aorta and iliac arteries as well as calcification in both proximal renal arteries, stable. There is degenerative change in the thoracic spine. There are no blastic or lytic bone lesions. Bones do appear osteoporotic.  IMPRESSION: Essentially stable study compared to 6 months prior. Fifth evidence of hepatic cirrhosis without focal lesion. Cholelithiasis. Pancreas is somewhat atrophic. A small cyst in the body the pancreas is stable. There is bibasilar atelectatic change with small pleural effusions bilaterally.  Multiple cysts arising from each kidney, several of which are mildly complex. The appearance is consistent with adult polycystic kidney disease and is stable. No hydronephrosis on either side. No renal or ureteral calculus on either side.  Diverticulum arising from the rightward aspect of the urinary bladder  posteriorly.  Scarring in the right inguinal region. There is fat in each inguinal ring. Appendix absent.  Thickening of the gastric wall. Question a degree of chronic gastritis.  Extensive atherosclerotic calcification.  Bones osteoporotic.  Sigmoid diverticula without diverticulitis. No bowel obstruction. No free  air or portal venous air.   Electronically Signed   By: Lowella Grip III M.D.   On: 10/31/2014 22:09    Scheduled Meds: . albuterol  2.5 mg Nebulization BID  .  ceFAZolin (ANCEF) IV  2 g Intravenous Q12H  . donepezil  10 mg Oral QHS  . fluticasone  2 spray Each Nare Daily  . heparin  5,000 Units Subcutaneous 3 times per day  . levothyroxine  100 mcg Oral QAC breakfast  . pantoprazole  40 mg Oral BID   Continuous Infusions: . sodium chloride 10 mL/hr at 10/31/14 1643    Time Spent: 25 min   Charlynne Cousins  [facility] Pager [phone]. If 7PM-7AM, please contact night-coverage at [email or web address], password [facility] 11/01/2014, 8:19 AM  LOS: 6 days

## 2014-11-01 NOTE — Progress Notes (Signed)
[name] [name]   DOB:27-Feb-1923   OZ#:[id number]   NGE#:[id number]  Patient Care Team: Cassandria Anger, MD as PCP - General Patrici Ranks, MD as PCP - Hematology/Oncology (Hematology and Oncology) Gatha Mayer, MD (Gastroenterology) Evans Lance, MD (Cardiology) Judeth Horn, MD (General Surgery) Danie Binder, MD as Consulting Physician (Gastroenterology) I have seen the patient, examined him and edited the notes as follows  Subjective: Patient seen and examined. No new events overnight. Denies fevers, chills, night sweats, vision changes, or mucositis. Denies any respiratory complaints. Denies any chest pain or palpitations. Denies lower extremity swelling. Denies nausea, heartburn or change in bowel habits. Last bowel movement on 8/8.  Appetite is normal. Denies any dysuria. Denies abnormal skin rashes, or neuropathy. Denies any bleeding issues such as epistaxis, hematemesis, hematuria or hematochezia. Ambulating without difficulty.   Scheduled Meds: . albuterol  2.5 mg Nebulization BID  .  ceFAZolin (ANCEF) IV  2 g Intravenous Q12H  . donepezil  10 mg Oral QHS  . fluticasone  2 spray Each Nare Daily  . heparin  5,000 Units Subcutaneous 3 times per day  . levothyroxine  100 mcg Oral QAC breakfast  . pantoprazole  40 mg Oral BID   Continuous Infusions: . sodium chloride 10 mL/hr at 10/31/14 1643   PRN Meds:.acetaminophen **OR** acetaminophen, alum & mag hydroxide-simeth, ondansetron **OR** ondansetron (ZOFRAN) IV  Objective:  Filed Vitals:   11/01/14 0424  BP: 114/50  Pulse: 61  Temp: 98.3 F (36.8 C)  Resp: 17      Intake/Output Summary (Last 24 hours) at 11/01/14 0656 Last data filed at 11/01/14 0425  Gross per 24 hour  Intake  125.5 ml  Output    700 ml  Net -574.5 ml    ECOG PERFORMANCE STATUS:1  GENERAL:alert, no distress and comfortable. He appears younger than stated age SKIN: skin color, texture, turgor are normal, no rashes or significant  lesions EYES: normal, conjunctiva are pink and non-injected, sclera clear OROPHARYNX:no exudate, no erythema and lips, buccal mucosa, and tongue normal  NECK: supple, thyroid normal size, non-tender, without nodularity LYMPH: no palpable lymphadenopathy in the cervical, axillary or inguinal LUNGS: clear to auscultation and percussion with normal breathing effort HEART: regular rate & rhythm and no murmurs and no lower extremity edema ABDOMEN:abdomen soft, non-tender and normal bowel sounds Musculoskeletal:no cyanosis of digits and no clubbing  PSYCH: alert & oriented x 3 with fluent speech NEURO: no focal motor/sensory deficits    CBG (last 3)  No results for input(s): GLUCAP in the last 72 hours.   Labs:   Recent Labs Lab 10/26/14 1003 10/27/14 0415 10/28/14 0646 10/29/14 0624 10/31/14 0600 11/01/14 0514  WBC 14.5* 14.6* 10.2 9.0 11.1* 10.8*  HGB 14.1 11.0* 11.9* 11.3* 12.0* 10.5*  HCT 40.9 32.6* 34.9* 33.1* 35.4* 30.5*  PLT 125* 102* 105* 103* 145* 144*  MCV 87.8 87.9 88.6 87.8 87.6 87.1  MCH 30.3 29.6 30.2 30.0 29.7 30.0  MCHC 34.5 33.7 34.1 34.1 33.9 34.4  RDW 15.1 15.8* 16.1* 16.1* 16.5* 16.8*  LYMPHSABS 0.3*  --   --   --   --   --   MONOABS 1.2*  --   --   --   --   --   EOSABS 0.1  --   --   --   --   --   BASOSABS 0.0  --   --   --   --   --  Chemistries:    Recent Labs Lab 10/26/14 1003 10/27/14 0415 10/28/14 0546 10/29/14 0624 10/31/14 0600 11/01/14 0514  NA 132* 135 139 136 135 133*  K 4.3 3.8 4.2 4.0 3.9 3.9  CL 98* 104 106 104 100* 103  CO2 22 20* 23 22 25 23  GLUCOSE 102* 83 96 91 89 107*  BUN 34* 32* 26* 21* 24* 32*  CREATININE 1.71* 1.72* 1.57* 1.53* 1.46* 1.59*  CALCIUM 8.6* 7.6* 8.0* 8.4* 8.7* 8.4*  AST 222* 119*  --   --   --  40  ALT 135* 94*  --   --   --  18  ALKPHOS 152* 84  --   --   --  195*  BILITOT 2.5* 3.2*  --   --   --  0.8    GFR Estimated Creatinine Clearance: 25.9 mL/min (by C-G formula based on Cr of  1.59).  Liver Function Tests:  Recent Labs Lab 10/26/14 1003 10/27/14 0415 11/01/14 0514  AST 222* 119* 40  ALT 135* 94* 18  ALKPHOS 152* 84 195*  BILITOT 2.5* 3.2* 0.8  PROT 6.8 5.4* 5.8*  ALBUMIN 3.6 2.7* 2.7*    Recent Labs Lab 10/26/14 1003  LIPASE 86*    Urine Studies Negative  Microbiology Blood cultures positive for Klebsiella Oxytoca    Imaging Studies:  Ct Abdomen Pelvis Wo Contrast: I reviewed the scans with patient and family  10/31/2014    COMPARISON:  April 30, 2014  FINDINGS: There are bilateral pleural effusions. There is bibasilar atelectatic change. Pacemaker leads attached to the right heart. Pericardium is not thickened.  The liver has a nodular contour consistent with cirrhosis. No focal liver lesions are identified on this noncontrast enhanced study. There is cholelithiasis. Gallbladder wall is not appreciably thickened. There is no biliary duct dilatation. There are stable periportal lymph nodes, likely due to cirrhosis.  Spleen appears normal. Pancreas is somewhat atrophic. A 1 cm cyst in the body of the pancreas appear stable. No new pancreatic lesion is identified.  Adrenals appear normal.  There is extensive cystic change arising from both kidneys, with several cysts on the right showing areas of rim-like calcification, stable. There is no hydronephrosis on either side. There is a tiny presumed hyperdense cyst in the mid left kidney. There is no renal or ureteral calculus on either side.  In the pelvis, the urinary bladder is midline with wall thickness within normal limits. There is a diverticulum arising from the posterior rightward aspect of the urinary bladder measuring 3.4 x 2.3 cm, stable. There is fat in each inguinal ring. There is scarring in the right inguinal ring, stable. There is a total hip prosthesis on the right. There is no pelvic mass or pelvic fluid collection. There is a transurethral prostate resection defect. There are multiple sigmoid  diverticula without diverticulitis. Appendix absent.  There is no bowel obstruction. No free air or portal venous air. There appears to be thickening of the gastric wall diffusely, a finding also present on prior study. There is no ascites or abscess in the an or pelvis. There is no lymph node prominence beyond the periportal lymph node prominence described above. There is extensive atherosclerotic change in aorta and iliac arteries as well as calcification in both proximal renal arteries, stable. There is degenerative change in the thoracic spine. There are no blastic or lytic bone lesions. Bones do appear osteoporotic.  IMPRESSION: Essentially stable study compared to 6 months prior. Fifth evidence of hepatic   cirrhosis without focal lesion. Cholelithiasis. Pancreas is somewhat atrophic. A small cyst in the body the pancreas is stable. There is bibasilar atelectatic change with small pleural effusions bilaterally.  Multiple cysts arising from each kidney, several of which are mildly complex. The appearance is consistent with adult polycystic kidney disease and is stable. No hydronephrosis on either side. No renal or ureteral calculus on either side.  Diverticulum arising from the rightward aspect of the urinary bladder posteriorly.  Scarring in the right inguinal region. There is fat in each inguinal ring. Appendix absent.  Thickening of the gastric wall. Question a degree of chronic gastritis.  Extensive atherosclerotic calcification.  Bones osteoporotic.  Sigmoid diverticula without diverticulitis. No bowel obstruction. No free air or portal venous air.   Electronically Signed   By: Lowella Grip III M.D.   On: 10/31/2014 22:09    Assessment/Plan: 81 y.o.  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 Unfortunately, insurance company will not pay for PET/CT scan to be done as an inpatient. CT scan with oral contrast only (avoinding IV contrast  due to recent kidney injury) on 8/8 shows known  thickening of the gastric wall diffusely, negative fornew metastatic lesions or new abdominal lymphadenopathy. We informed the patient that typically, systemic chemotherapy is not recommended in the presence of recent bacteremia due to high risk of infection. 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 He mentioned exposure to asbestos at work in the remote past   Concerned about potential metastatic disease from GI source. Tumor markers with alpha-fetoprotein and CA-19-9 are pending Hepatic function tests show elevated Alk Phos at 195 in the setting of malignancy, and improving transaminases with AST 40 and ALT 18. Bili is normal. Will follow with PET CT next week  Chronic kidney disease stage IV The patient is encouraged to drink oral fluids. Will try to avoid IV CT contrast with CTs due to this  Cardiomyopathy status post pacemaker/defibrillator implantation Clinically, he appears to be euvolemic with no signs of congestive heart failure  Recent bacteremia Source is unknown. Blood cultures positive for Klebsiella Oxytoca. He is on Ancef CT scan abdomen and pelvis does not appear to reveal the infectious source Hopefully can be DC tomorrow after 1 week of IV antibiotics  Mild weakness Likely due to deconditioning PT evaluation for assessment and safety was done and he did well  Will defer to primary service for other medical issues. Hopefully DC tomorrow Will arrange out-patient follow up   [facility], PA-C 11/01/2014  6:56  AM Chaquetta Schlottman, MD 11/01/2014

## 2014-11-02 ENCOUNTER — Telehealth: Payer: Self-pay | Admitting: Hematology and Oncology

## 2014-11-02 DIAGNOSIS — C8335 Diffuse large B-cell lymphoma, lymph nodes of inguinal region and lower limb: Secondary | ICD-10-CM

## 2014-11-02 DIAGNOSIS — I5022 Chronic systolic (congestive) heart failure: Secondary | ICD-10-CM

## 2014-11-02 DIAGNOSIS — N183 Chronic kidney disease, stage 3 (moderate): Secondary | ICD-10-CM

## 2014-11-02 LAB — BASIC METABOLIC PANEL
Anion gap: 10 (ref 5–15)
BUN: 36 mg/dL — AB (ref 6–20)
CHLORIDE: 100 mmol/L — AB (ref 101–111)
CO2: 23 mmol/L (ref 22–32)
CREATININE: 1.71 mg/dL — AB (ref 0.61–1.24)
Calcium: 8.5 mg/dL — ABNORMAL LOW (ref 8.9–10.3)
GFR calc Af Amer: 39 mL/min — ABNORMAL LOW (ref >60)
GFR calc non Af Amer: 33 mL/min — ABNORMAL LOW (ref >60)
Glucose, Bld: 104 mg/dL — ABNORMAL HIGH (ref 65–99)
Potassium: 4.3 mmol/L (ref 3.5–5.1)
Sodium: 133 mmol/L — ABNORMAL LOW (ref 135–145)

## 2014-11-02 LAB — AFP TUMOR MARKER: AFP TUMOR MARKER: 1.7 ng/mL (ref 0.0–8.3)

## 2014-11-02 LAB — CANCER ANTIGEN 19-9: CA 19-9: 15 U/mL (ref 0–35)

## 2014-11-02 LAB — URIC ACID: Uric Acid, Serum: 5.4 mg/dL (ref 4.4–7.6)

## 2014-11-02 MED ORDER — AMOXICILLIN-POT CLAVULANATE 875-125 MG PO TABS: 1.0000 | ORAL_TABLET | Freq: Two times a day (BID) | ORAL | Status: DC

## 2014-11-02 NOTE — Discharge Instructions (Signed)
Follow with Walker Kehr, MD in 2-4 weeks Follow up with Dr. Alvy Bimler as scheduled in 6 days   Please get a complete blood count and chemistry panel checked by your Primary MD at your next visit, and again as instructed by your Primary MD. Please get your medications reviewed and adjusted by your Primary MD.  Please request your Primary MD to go over all Hospital Tests and Procedure/Radiological results at the follow up, please get all Hospital records sent to your Prim MD by signing hospital release before you go home.  If you had Pneumonia of Lung problems at the Hospital: Please get a 2 view Chest X ray done in 6-8 weeks after hospital discharge or sooner if instructed by your Primary MD.  If you have Congestive Heart Failure: Please call your Cardiologist or Primary MD anytime you have any of the following symptoms:  1) 3 pound weight gain in 24 hours or 5 pounds in 1 week  2) shortness of breath, with or without a dry hacking cough  3) swelling in the hands, feet or stomach  4) if you have to sleep on extra pillows at night in order to breathe  Follow cardiac low salt diet and 1.5 lit/day fluid restriction.  If you have diabetes Accuchecks 4 times/day, Once in AM empty stomach and then before each meal. Log in all results and show them to your primary doctor at your next visit. If any glucose reading is under 80 or above 300 call your primary MD immediately.  If you have Seizure/Convulsions/Epilepsy: Please do not drive, operate heavy machinery, participate in activities at heights or participate in high speed sports until you have seen by Primary MD or a Neurologist and advised to do so again.  If you had Gastrointestinal Bleeding: Please ask your Primary MD to check a complete blood count within one week of discharge or at your next visit. Your endoscopic/colonoscopic biopsies that are pending at the time of discharge, will also need to followed by your Primary MD.  Get  Medicines reviewed and adjusted. Please take all your medications with you for your next visit with your Primary MD  Please request your Primary MD to go over all hospital tests and procedure/radiological results at the follow up, please ask your Primary MD to get all Hospital records sent to his/her office.  If you experience worsening of your admission symptoms, develop shortness of breath, life threatening emergency, suicidal or homicidal thoughts you must seek medical attention immediately by calling 911 or calling your MD immediately  if symptoms less severe.  You must read complete instructions/literature along with all the possible adverse reactions/side effects for all the Medicines you take and that have been prescribed to you. Take any new Medicines after you have completely understood and accpet all the possible adverse reactions/side effects.   Do not drive or operate heavy machinery when taking Pain medications.   Do not take more than prescribed Pain, Sleep and Anxiety Medications  Special Instructions: If you have smoked or chewed Tobacco  in the last 2 yrs please stop smoking, stop any regular Alcohol  and or any Recreational drug use.  Wear Seat belts while driving.  Please note You were cared for by a hospitalist during your hospital stay. If you have any questions about your discharge medications or the care you received while you were in the hospital after you are discharged, you can call the unit and asked to speak with the hospitalist on call if  the hospitalist that took care of you is not available. Once you are discharged, your primary care physician will handle any further medical issues. Please note that NO REFILLS for any discharge medications will be authorized once you are discharged, as it is imperative that you return to your primary care physician (or establish a relationship with a primary care physician if you do not have one) for your aftercare needs so that they  can reassess your need for medications and monitor your lab values.  You can reach the hospitalist office at phone [phone] or fax [phone]   If you do not have a primary care physician, you can call [phone] for a physician referral.  Activity: As tolerated with Full fall precautions use walker/cane & assistance as needed  Diet: regular  Disposition Home

## 2014-11-02 NOTE — Progress Notes (Signed)
Pt discharged home with family in stable condition. Discharge instructions and script given. Pt and family verbalized understanding.

## 2014-11-02 NOTE — Progress Notes (Signed)
[name] [name]   DOB:1922/11/19   IH#:[id number]   OVF#:[id number]  Patient Care Team: Cassandria Anger, MD as PCP - General Patrici Ranks, MD as PCP - Hematology/Oncology (Hematology and Oncology) Gatha Mayer, MD (Gastroenterology) Evans Lance, MD (Cardiology) Judeth Horn, MD (General Surgery) Danie Binder, MD as Consulting Physician (Gastroenterology)  I have seen the patient, examined him and edited the notes as follows  Subjective: Patient seen and examined. No new events overnight. Denies fevers, chills, night sweats, vision changes, or mucositis. Denies any respiratory complaints. Denies any chest pain or palpitations. Denies lower extremity swelling. Denies nausea, heartburn or change in bowel habits. Last bowel movement on 8/8. Appetite is normal. Denies any dysuria. Denies abnormal skin rashes, or neuropathy. Denies any bleeding issues such as epistaxis, hematemesis, hematuria or hematochezia. Ambulating without difficulty.   Scheduled Meds: . albuterol  2.5 mg Nebulization BID  .  ceFAZolin (ANCEF) IV  2 g Intravenous Q12H  . donepezil  10 mg Oral QHS  . fluticasone  2 spray Each Nare Daily  . heparin  5,000 Units Subcutaneous 3 times per day  . levothyroxine  100 mcg Oral QAC breakfast  . pantoprazole  40 mg Oral BID   Continuous Infusions: . sodium chloride 10 mL/hr at 10/31/14 1643   PRN Meds:.acetaminophen **OR** acetaminophen, alum & mag hydroxide-simeth, ondansetron **OR** ondansetron (ZOFRAN) IV  Objective:  Filed Vitals:   11/02/14 0455  BP: 138/62  Pulse: 65  Temp: 98.4 F (36.9 C)  Resp:       Intake/Output Summary (Last 24 hours) at 11/02/14 0651 Last data filed at 11/02/14 0456  Gross per 24 hour  Intake    711 ml  Output   1525 ml  Net   -814 ml    ECOG PERFORMANCE STATUS:1  GENERAL:alert, no distress and comfortable. He appears younger than stated age SKIN: skin color, texture, turgor are normal, no rashes or significant  lesions EYES: normal, conjunctiva are pink and non-injected, sclera clear OROPHARYNX:no exudate, no erythema and lips, buccal mucosa, and tongue normal  NECK: supple, thyroid normal size, non-tender, without nodularity LYMPH: no palpable lymphadenopathy in the cervical, axillary or inguinal LUNGS: clear to auscultation and percussion with normal breathing effort HEART: regular rate & rhythm and no murmurs and no lower extremity edema ABDOMEN:abdomen soft, non-tender and normal bowel sounds Musculoskeletal:no cyanosis of digits and no clubbing  PSYCH: alert & oriented x 3 with fluent speech NEURO: no focal motor/sensory deficits    CBG (last 3)  No results for input(s): GLUCAP in the last 72 hours.   Labs:   Recent Labs Lab 10/26/14 1003 10/27/14 0415 10/28/14 0646 10/29/14 0624 10/31/14 0600 11/01/14 0514  WBC 14.5* 14.6* 10.2 9.0 11.1* 10.8*  HGB 14.1 11.0* 11.9* 11.3* 12.0* 10.5*  HCT 40.9 32.6* 34.9* 33.1* 35.4* 30.5*  PLT 125* 102* 105* 103* 145* 144*  MCV 87.8 87.9 88.6 87.8 87.6 87.1  MCH 30.3 29.6 30.2 30.0 29.7 30.0  MCHC 34.5 33.7 34.1 34.1 33.9 34.4  RDW 15.1 15.8* 16.1* 16.1* 16.5* 16.8*  LYMPHSABS 0.3*  --   --   --   --   --   MONOABS 1.2*  --   --   --   --   --   EOSABS 0.1  --   --   --   --   --   BASOSABS 0.0  --   --   --   --   --  Chemistries:    Recent Labs Lab 10/26/14 1003 10/27/14 0415 10/28/14 0546 10/29/14 0624 10/31/14 0600 11/01/14 0514 11/02/14 0440  NA 132* 135 139 136 135 133* 133*  K 4.3 3.8 4.2 4.0 3.9 3.9 4.3  CL 98* 104 106 104 100* 103 100*  CO2 22 20* '23 22 25 23 23  ' GLUCOSE 102* 83 96 91 89 107* 104*  BUN 34* 32* 26* 21* 24* 32* 36*  CREATININE 1.71* 1.72* 1.57* 1.53* 1.46* 1.59* 1.71*  CALCIUM 8.6* 7.6* 8.0* 8.4* 8.7* 8.4* 8.5*  AST 222* 119*  --   --   --  40  --   ALT 135* 94*  --   --   --  18  --   ALKPHOS 152* 84  --   --   --  195*  --   BILITOT 2.5* 3.2*  --   --   --  0.8  --     GFR Estimated  Creatinine Clearance: 24 mL/min (by C-G formula based on Cr of 1.71).  Liver Function Tests:  Recent Labs Lab 10/26/14 1003 10/27/14 0415 11/01/14 0514  AST 222* 119* 40  ALT 135* 94* 18  ALKPHOS 152* 84 195*  BILITOT 2.5* 3.2* 0.8  PROT 6.8 5.4* 5.8*  ALBUMIN 3.6 2.7* 2.7*    Recent Labs Lab 10/26/14 1003  LIPASE 86*    Urine Studies Negative  Microbiology Blood cultures positive for Klebsiella Oxytoca    Imaging Studies: None todayOF SURGICAL PATHOLOGY FnINAL DIAGNOSIS Diagnosis [phone] Stomach, biopsy, stomach ulcer - INVOLVEMENT BY DIFFUSE LARGE B-CELL LYMPHOMA.  Microscopic Comment LYMPHOMA Histologic type: Non-Hodgkin B-cell lymphoma: Diffuse large B-cell lymphoma. Grade (if applicable): High grade. Flow cytometry: N/A. Immunohistochemical stains: CD20, CD3, bcl-6, bcl-2, CD10, CD21, CD79a, Ki-67, CD56, chromogranin, synaptophysin,pancytokeratin. Touch preps/imprints: N/A. Comments: There are several fragments of tissue with infiltrating large lymphoid cells. There is frequent apoptotic debris. There are fragments of necrosis (ulcer debris). Immunohistochemistry reveals the cells are positive for CD20, CD79a, bcl-6, and bcl-2 (weak). Ki-67 reveals a high proliferate rate (60%). Discrete dendritic networks are not seen with CD21. CD10 is negative. CD3 highlights scattered T-cells. The atypical cells are negative for pancytokeratin and neuroendocrine markers (CD56, chromogranin, synaptophysin). The overall findings are consistent with involvement by a diffuse large B-cell lymphoma.  There is heavy bacterial contamination on Warthin-Starry staining, but definitiveHelicobacter pylori is not seen. The case was called to Dr. Oneida Alar on 10/20/2014. Vicente Males MD Pathologist, Electronic Signature (Case signed 10/20/2014)  Assessment/Plan: 81 y.o.  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 We informed the patient that typically, systemic chemotherapy is not recommended in the presence of recent bacteremia due to high risk of infection. Staging is pending. Unfortunately, insurance company will not pay for PET/CT scan to be done as an inpatient. 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 He mentioned exposure to asbestos at work in the remote past  Concerned about potential metastatic disease from GI source. Alpha-fetoprotein is 1.7 and CA-19-9 is 15  Hepatic function tests show elevated Alk Phos at 195 in the setting of malignancy, and improving transaminases with AST 40 and ALT 18. Bili is normal. Will follow with PET CT next week  Chronic kidney disease stage IV The patient is encouraged to drink oral fluids. Will try to avoid IV CT contrast with CTs due to this  Cardiomyopathy status post pacemaker/defibrillator implantation Clinically, he appears to be euvolemic with no signs of congestive heart failure  Recent bacteremia Source is unknown. Blood cultures were positive for Klebsiella Oxytoca. He placed on Ancef. Repeat cultures are negative to date CT scan abdomen and pelvis does not appear to reveal the infectious source Hopefully can be DC today after 1 week of IV antibiotics  Mild weakness Likely due to deconditioning PT evaluation for assessment and safety was done and he did well  Will  defer to primary service for other medical issues. Hopefully DC today. Out-patient follow up is arranged Will sign off   Rondel Jumbo, PA-C 11/02/2014  6:51 AM Beda Dula, MD 11/02/2014

## 2014-11-02 NOTE — Discharge Summary (Signed)
Physician Discharge Summary  [name] [name] RKY:[id number] DOB: 1922-07-30 DOA: 10/26/2014  PCP: Walker Kehr, MD  Admit date: 10/26/2014 Discharge date: 11/02/2014  Time spent: > 35 minutes  Recommendations for Outpatient Follow-up:  1. Follow up with Dr. Alain Marion in 1 month 2. Follow up with Dr. Alvy Bimler in 6 days    Discharge Diagnoses:  Active Problems:   Hypothyroidism   Essential hypertension   Coronary atherosclerosis   Chronic systolic heart failure   Automatic implantable cardioverter-defibrillator in situ   Hyponatremia   COPD bronchitis   Cirrhosis   PUD (peptic ulcer disease)   CKD (chronic kidney disease)   Thrombocytopenia   Pulmonary nodules/lesions, multiple   Bacteremia   Diffuse large B cell lymphoma   Pyrexia   Arterial hypotension  Discharge Condition: stable  Diet recommendation: regular  Filed Weights   10/29/14 0533 10/30/14 0524 10/31/14 0620  Weight: 68.402 kg (150 lb 12.8 oz) 67.858 kg (149 lb 9.6 oz) 67.087 kg (147 lb 14.4 oz)   History of present illness:  [name] [name] is a very pleasant 81 y.o. male with a past medical history that includes hypertension, primary biliary cirrhosis, hypothyroidism, CAD dose post pacemaker , chronic kidney disease most recently B-cell lymphoma of the stomach presents to the emergency department with the chief complaint of severe generalized weakness and chills. Initial evaluation in the emergency department reveals sepsis of unknown source with fever hypotension, leukocytosis. Patient reports he was in his usual state of health yesterday and felt well when he went to bed last night. He awakened at 5 AM and had "chills all over". Associated symptoms include generalized weakness such that he had difficulty getting out of bed. He denies headache dizziness syncope or near-syncope. He denies chest pain palpitations shortness of breath. He denies abdominal pain nausea vomiting diarrhea constipation. He denies dysuria  hematuria frequency or urgency. Workup in the emergency department includes complete metabolic panel significant for sodium of 132 creatinine 1.7 serum glucose 102 alkaline phosphatase 152 AST 222 ALT 135 total bilirubin 2.5, BNP 355 lactic acid 1.55. CBC significant for leukocytosis of 14.5 and platelets 125. Urinalysis is unremarkable, chest x-ray Increased interstitial densities bilaterally suggest acute bronchitis or early interstitial pneumonia. There is no definite pulmonary edema though early interstitial edema could be manifested in a similar fashion. CT of the chest reveals multiple new pulmonary nodules bilaterally highly worrisome for pulmonary metastatic disease. Extensive atherosclerotic disease. Cirrhotic liver. Cholelithiasis. Extensive renal cystic disease. Diffuse wall thickening of the esophagus, nonspecific, can be seen with esophagitis and reflux disease though tumor not entirely excluded; consider followup endoscopy when clinically appropriate. In the emergency department his oxygen saturation level is 93% on 2 L and his temperature is 315.1 with a systolic blood pressure in the high 80s.Marland Kitchen He is provided with 1-1/2 L of normal saline vancomycin and Zosyn. At the time of my exam his blood pressure. At the time of my exam systolic blood pressures 97.  Hospital Course:  Sepsis due to Klebsiella bacteremia -patient was started empirically on Vancomycin and zosyn on admission, and blood culture was sent. His blood cultures ended up growing Klebsiella, and based on sensitivities his antibiotics were narrowed to IV Ancef. Patient has been on IV antibiotics for a full 7 days, he is afebrile,   has leukocytosis is improved, he can be transitioned to by mouth Augmentin (as per sensitivities) for 7 additional days to complete a total 14 day course. Repeat blood cultures were obtained prior to  discharge. 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 Patient will have a PET scan as an outpatient as per oncology.   Chronic systolic heart failure - he appears euvolemic.  Thrombocytopenia - Multifactorial due to cirrhosis and sepsis, stable platelets Primary biliary cirrhosis - outpatient management Chronic kidney disease stage III -  baseline creatinine between 1.5 and 1.7, at baseline on discharge  Procedures:  None   Consultations:  Oncology   Discharge Exam: Filed Vitals:   11/01/14 1736 11/01/14 2051 11/02/14 0455 11/02/14 0752  BP:  130/61 138/62   Pulse:  72 65   Temp:  97.9 F (36.6 C) 98.4 F (36.9 C)   TempSrc:  Oral Oral   Resp:  16    Height:      Weight:      SpO2: 94% 95% 95% 93%   General: NAD Cardiovascular: RRR Respiratory: CTA biL  Discharge Instructions     Medication List    TAKE these medications        albuterol (2.5 MG/3ML) 0.083% nebulizer solution  Commonly known as:  PROVENTIL  USE ONE VIAL VIA NEBULIZER FOUR TIMES DAILY AS NEEDED.     amoxicillin-clavulanate 875-125 MG per tablet  Commonly known as:  AUGMENTIN  Take 1 tablet by mouth 2 (two) times daily.     atorvastatin 20 MG tablet  Commonly known as:  LIPITOR  TAKE 1/2 TABLET  TWICE DAILY     cholecalciferol 1000 UNITS tablet  Commonly known as:  VITAMIN D  Take 1,000 Units by mouth daily.     diltiazem 2 % Gel  Apply 1 application topically 3 (three) times daily.     donepezil 10 MG tablet  Commonly known as:  ARICEPT  TAKE 1 TABLET AT BEDTIME     fluticasone 50 MCG/ACT nasal spray  Commonly known as:  FLONASE  Place 2 sprays into both nostrils daily.     folic acid 161 MCG tablet  Commonly known as:  FOLVITE  Take 800 mcg by mouth daily.     levothyroxine 100 MCG tablet  Commonly known as:  SYNTHROID, LEVOTHROID  Take 1 tablet (100 mcg total) by mouth daily.     metoprolol succinate 25 MG  24 hr tablet  Commonly known as:  TOPROL-XL  Take 0.5 tablets (12.5 mg total) by mouth 2 (two) times daily.     nitroGLYCERIN 0.4 MG SL tablet  Commonly known as:  NITROSTAT  Place 1 tablet (0.4 mg total) under the tongue every 5 (five) minutes as needed. Chest pain     pantoprazole 40 MG tablet  Commonly known as:  PROTONIX  Take 1 tablet (40 mg total) by mouth daily.     triamcinolone 55 MCG/ACT nasal inhaler  Commonly known as:  NASACORT  Place 1 spray into the nose daily.     ursodiol 500 MG tablet  Commonly known as:  ACTIGALL  Take 1 tablet by mouth 2 (two) times daily.     VITAMIN C PO  Take 1 tablet by mouth daily.           Follow-up Information    Follow up with Walker Kehr, MD. Schedule an appointment as soon as possible for a visit in 1 month.   Specialty:  Internal Medicine   Contact information:   [place] [id number]  [phone]       Follow up with [facility], NI, MD In 6 days.   Specialty:  Hematology and Oncology   Contact information:   [place] [phone]       The results of significant diagnostics from this hospitalization (including imaging, microbiology, ancillary and laboratory) are listed below for reference.    Significant Diagnostic Studies: Ct Abdomen Pelvis Wo Contrast  10/31/2014   CLINICAL DATA:  Fever and leukocytosis.  EXAM: CT ABDOMEN AND PELVIS WITHOUT CONTRAST  TECHNIQUE: Multidetector CT imaging of the abdomen and pelvis was performed following the standard protocol without IV contrast. Oral contrast was administered.  COMPARISON:  April 30, 2014  FINDINGS: There are bilateral pleural effusions. There is bibasilar atelectatic change. Pacemaker leads attached to the right heart. Pericardium is not thickened.  The liver has a nodular contour consistent with cirrhosis. No focal liver lesions are identified on this noncontrast enhanced study. There is cholelithiasis. Gallbladder wall  is not appreciably thickened. There is no biliary duct dilatation. There are stable periportal lymph nodes, likely due to cirrhosis.  Spleen appears normal. Pancreas is somewhat atrophic. A 1 cm cyst in the body of the pancreas appear stable. No new pancreatic lesion is identified.  Adrenals appear normal.  There is extensive cystic change arising from both kidneys, with several cysts on the right showing areas of rim-like calcification, stable. There is no hydronephrosis on either side. There is a tiny presumed hyperdense cyst in the mid left kidney. There is no renal or ureteral calculus on either side.  In the pelvis, the urinary bladder is midline with wall thickness within normal limits. There is a diverticulum arising from the posterior rightward aspect of the urinary bladder measuring 3.4 x 2.3 cm, stable. There is fat in each inguinal ring. There is scarring in the right inguinal ring, stable. There is a total hip prosthesis on the right. There is no pelvic mass or pelvic fluid collection. There is a transurethral prostate resection defect. There are multiple sigmoid diverticula without diverticulitis. Appendix absent.  There is no bowel obstruction. No free air or portal venous air. There appears to be thickening of the gastric wall diffusely, a finding also present on prior study. There is no ascites or abscess in the an or pelvis. There is no lymph node prominence beyond the periportal lymph node prominence described above. There is extensive atherosclerotic change in aorta and iliac arteries as well as calcification in both proximal renal arteries, stable. There is degenerative change in the thoracic spine. There are no blastic or lytic bone lesions. Bones do appear osteoporotic.  IMPRESSION: Essentially stable study compared to 6 months prior. Fifth evidence of hepatic cirrhosis without focal lesion. Cholelithiasis. Pancreas is somewhat atrophic. A small cyst in the body the pancreas is stable. There is  bibasilar atelectatic change with small pleural effusions bilaterally.  Multiple cysts arising from each kidney, several of which are mildly complex. The appearance is consistent with adult polycystic kidney disease and is stable. No hydronephrosis on either side. No renal or ureteral calculus on either side.  Diverticulum arising from the rightward aspect of the urinary bladder posteriorly.  Scarring in the right inguinal region. There is fat in each inguinal ring. Appendix absent.  Thickening of the gastric wall. Question a degree of chronic gastritis.  Extensive atherosclerotic calcification.  Bones osteoporotic.  Sigmoid diverticula without diverticulitis. No bowel obstruction. No free air or portal venous air.   Electronically Signed  By: Lowella Grip III M.D.   On: 10/31/2014 22:09   Ct Chest Wo Contrast  10/26/2014   CLINICAL DATA:  Awoke with chills and shortness of breath this morning at 0500 hours, hypoxemia, hypertension, was coronary disease, renal insufficiency, cirrhosis  EXAM: CT CHEST WITHOUT CONTRAST  TECHNIQUE: Multidetector CT imaging of the chest was performed following the standard protocol without IV contrast. Sagittal and coronal MPR images reconstructed from axial data set.  COMPARISON:  04/30/2014  FINDINGS: Dependent gallstones in gallbladder.  Large cyst at the upper poles of both kidneys again noted.  Cirrhotic appearing liver again identified.  Remaining visualized portion of upper abdomen grossly unremarkable.  Extensive atherosclerotic calcifications including coronary arteries.  AICD leads RIGHT atrium and RIGHT ventricle.  Upper normal sized RIGHT paratracheal node 10 mm short axis image 22.  No definite thoracic adenopathy.  Thickening of the wall of the mid to distal esophagus little changed.  Calcified pleural plaques in the RIGHT hemi thorax but not definitely seen on LEFT.  Calcified granulomata RIGHT lower lobe.  11 x 8 mm RIGHT upper lobe pulmonary nodule image 20,  new.  21 x 17 mm lobulated medial RIGHT lower lobe nodule image 44, new.  16 x 15 mm lobulated LEFT lower lobe nodule image 31, new.  Multiple additional new small BILATERAL pulmonary nodules.  Findings are highly concerning for pulmonary metastases.  Dependent atelectasis without definite infiltrate, pleural effusion or pneumothorax.  Diffuse osseous demineralization with multiple thoracic compression fractures, including a new fracture with interval vertebroplasty in the upper thoracic spine.  No definite osseous metastatic lesions.  IMPRESSION: Multiple new pulmonary nodules bilaterally highly worrisome for pulmonary metastatic disease.  Extensive atherosclerotic disease.  Cirrhotic liver.  Cholelithiasis.  Extensive renal cystic disease.  Diffuse wall thickening of the esophagus, nonspecific, can be seen with esophagitis and reflux disease though tumor not entirely excluded; consider followup endoscopy when clinically appropriate.   Electronically Signed   By: Lavonia Dana M.D.   On: 10/26/2014 12:13   Dg Chest Portable 1 View  10/26/2014   CLINICAL DATA:  Awakened this morning with chills and shortness of breath, was physically active yesterday, history of coronary artery disease  EXAM: PORTABLE CHEST - 1 VIEW  COMPARISON:  PA and lateral chest x-ray of April 30, 2014  FINDINGS: The lungs are adequately inflated. The interstitial markings are increased bilaterally. There is a stable calcified 1.3 cm x 1 cm nodule in the right lower lung. The heart is normal in size. The pulmonary vascularity is not engorged. The AICD is in reasonable position. There is no pleural effusion. There is tortuosity of the descending thoracic aorta. The observed bony thorax exhibits no acute abnormality.  IMPRESSION: Increased interstitial densities bilaterally suggest acute bronchitis or early interstitial pneumonia. There is no definite pulmonary edema though early interstitial edema could be manifested in a similar fashion.  A  PA and lateral chest x-ray would be useful if the patient can undergo the procedure.   Electronically Signed   By: David  Martinique M.D.   On: 10/26/2014 10:03    Microbiology: Recent Results (from the past 240 hour(s))  Culture, blood (routine x 2)     Status: None   Collection Time: 10/26/14  9:55 AM  Result Value Ref Range Status   Specimen Description BLOOD RIGHT HAND DRAWN BY RN  Final   Special Requests   Final    BOTTLES DRAWN AEROBIC AND ANAEROBIC AEB=8CC ANA=6CC   Culture  Setup Time  Final    GRAM NEGATIVE RODS Gram Stain Report Called to,Read Back By and Verified With: LEE B AT 0513 ON [id number] BY Mikel Cella K Performed at [place]    Culture   Final    KLEBSIELLA OXYTOCA SUSCEPTIBILITIES PERFORMED ON PREVIOUS CULTURE WITHIN THE LAST 5 DAYS. Performed at [facility]    Report Status 10/29/2014 FINAL  Final  Culture, blood (routine x 2)     Status: None   Collection Time: 10/26/14 10:08 AM  Result Value Ref Range Status   Specimen Description BLOOD LEFT ANTECUBITAL  Final   Special Requests BOTTLES DRAWN AEROBIC AND ANAEROBIC [facility] EACH  Final   Culture  Setup Time   Final    GRAM NEGATIVE RODS Gram Stain Report Called to,Read Back By and Verified With: LEE B AT [place] ON [id number] BY FORSYTH K Performed at [place]    Culture   Final    KLEBSIELLA OXYTOCA Performed at [facility]    Report Status 10/29/2014 FINAL  Final   Organism ID, Bacteria KLEBSIELLA OXYTOCA  Final      Susceptibility   Klebsiella oxytoca - MIC*    AMPICILLIN 4 RESISTANT Resistant     CEFAZOLIN <=4 SENSITIVE Sensitive     CEFEPIME <=1 SENSITIVE Sensitive     CEFTAZIDIME <=1 SENSITIVE Sensitive     CEFTRIAXONE <=1 SENSITIVE Sensitive     CIPROFLOXACIN <=0.25 SENSITIVE Sensitive     GENTAMICIN <=1 SENSITIVE Sensitive     IMIPENEM <=0.25 SENSITIVE Sensitive     TRIMETH/SULFA <=20 SENSITIVE  Sensitive     AMPICILLIN/SULBACTAM <=2 SENSITIVE Sensitive     PIP/TAZO <=4 SENSITIVE Sensitive     * KLEBSIELLA OXYTOCA  Urine culture     Status: None   Collection Time: 10/26/14 11:40 AM  Result Value Ref Range Status   Specimen Description URINE, CLEAN CATCH  Final   Special Requests NONE  Final   Culture   Final    80,000 COLONIES/ml MULTIPLE SPECIES PRESENT, SUGGEST RECOLLECTION Performed at [facility]    Report Status 10/28/2014 FINAL  Final  MRSA PCR Screening     Status: None   Collection Time: 10/26/14  3:35 PM  Result Value Ref Range Status   MRSA by PCR NEGATIVE NEGATIVE Final    Comment:        The GeneXpert MRSA Assay (FDA approved for NASAL specimens only), is one component of a comprehensive MRSA colonization surveillance program. It is not intended to diagnose MRSA infection nor to guide or monitor treatment for MRSA infections.      Labs: Basic Metabolic Panel:  Recent Labs Lab 10/27/14 0415 10/28/14 0546 10/29/14 0624 10/31/14 0600 11/01/14 0514 11/02/14 0440  NA 135 139 136 135 133* 133*  K 3.8 4.2 4.0 3.9 3.9 4.3  CL 104 106 104 100* 103 100*  CO2 20* 23 22 25 23 23   GLUCOSE 83 96 91 89 107* 104*  BUN 32* 26* 21* 24* 32* 36*  CREATININE 1.72* 1.57* 1.53* 1.46* 1.59* 1.71*  CALCIUM 7.6* 8.0* 8.4* 8.7* 8.4* 8.5*   Liver Function Tests:  Recent Labs Lab 10/27/14 0415 11/01/14 0514  AST 119* 40  ALT 94* 18  ALKPHOS 84 195*  BILITOT 3.2* 0.8  PROT 5.4* 5.8*  ALBUMIN 2.7* 2.7*   CBC:  Recent Labs Lab 10/27/14 0415 10/28/14 0646 10/29/14 0624 10/31/14 0600 11/01/14 0514  WBC  14.6* 10.2 9.0 11.1* 10.8*  HGB 11.0* 11.9* 11.3* 12.0* 10.5*  HCT 32.6* 34.9* 33.1* 35.4* 30.5*  MCV 87.9 88.6 87.8 87.6 87.1  PLT 102* 105* 103* 145* 144*   BNP: BNP (last 3 results)  Recent Labs  10/26/14 1003  BNP 355.0*   Signed:  GHERGHE, COSTIN  [facility] 11/02/2014, 2:23 PM

## 2014-11-02 NOTE — Telephone Encounter (Signed)
Called and left a message with appointments per pof and patient will also get these at d/c from the hospital

## 2014-11-03 LAB — HEPATITIS B SURFACE ANTIBODY,QUALITATIVE: Hep B S Ab: NONREACTIVE

## 2014-11-03 LAB — HEPATITIS B CORE ANTIBODY, TOTAL: Hep B Core Total Ab: NEGATIVE

## 2014-11-07 ENCOUNTER — Encounter (HOSPITAL_COMMUNITY)
Admission: RE | Admit: 2014-11-07 | Discharge: 2014-11-07 | Disposition: A | Discharge status: Home / Self Care | Payer: Medicare Other | Source: Ambulatory Visit | Attending: Hematology and Oncology | Admitting: Hematology and Oncology

## 2014-11-07 DIAGNOSIS — C8330 Diffuse large B-cell lymphoma, unspecified site: Secondary | ICD-10-CM | POA: Diagnosis not present

## 2014-11-07 LAB — CULTURE, BLOOD (ROUTINE X 2)
Culture: NO GROWTH
Culture: NO GROWTH

## 2014-11-07 LAB — GLUCOSE, CAPILLARY: GLUCOSE-CAPILLARY: 80 mg/dL (ref 65–99)

## 2014-11-07 MED ORDER — FLUDEOXYGLUCOSE F - 18 (FDG) INJECTION
7.5200 | Freq: Once | INTRAVENOUS | Status: DC | PRN
  Administered 2014-11-07: 7.52 via INTRAVENOUS
  Filled 2014-11-07: qty 7.52

## 2014-11-08 ENCOUNTER — Ambulatory Visit (HOSPITAL_BASED_OUTPATIENT_CLINIC_OR_DEPARTMENT_OTHER): Payer: Medicare Other | Admitting: Hematology and Oncology

## 2014-11-08 ENCOUNTER — Other Ambulatory Visit: Payer: Medicare Other

## 2014-11-08 ENCOUNTER — Telehealth: Payer: Self-pay | Admitting: Hematology and Oncology

## 2014-11-08 ENCOUNTER — Encounter: Payer: Self-pay | Admitting: *Deleted

## 2014-11-08 ENCOUNTER — Encounter: Payer: Self-pay | Admitting: Hematology and Oncology

## 2014-11-08 ENCOUNTER — Ambulatory Visit: Payer: Medicare Other

## 2014-11-08 VITALS — BP 133/54 | HR 72 | Temp 97.5°F | Resp 18 | Ht 64.0 in | Wt 156.5 lb

## 2014-11-08 DIAGNOSIS — C8330 Diffuse large B-cell lymphoma, unspecified site: Secondary | ICD-10-CM | POA: Diagnosis not present

## 2014-11-08 DIAGNOSIS — N184 Chronic kidney disease, stage 4 (severe): Secondary | ICD-10-CM | POA: Diagnosis not present

## 2014-11-08 DIAGNOSIS — D62 Acute posthemorrhagic anemia: Secondary | ICD-10-CM

## 2014-11-08 DIAGNOSIS — Z87898 Personal history of other specified conditions: Secondary | ICD-10-CM

## 2014-11-08 DIAGNOSIS — I5022 Chronic systolic (congestive) heart failure: Secondary | ICD-10-CM

## 2014-11-08 DIAGNOSIS — Z8619 Personal history of other infectious and parasitic diseases: Secondary | ICD-10-CM

## 2014-11-08 NOTE — Progress Notes (Signed)
Checked in new pt with no financial concerns.  Pt has Shauna's card for any billing questions, concerns or if financial assistance is needed.  °

## 2014-11-08 NOTE — Telephone Encounter (Signed)
Gave patient/relative avs report and appointments for August and September.

## 2014-11-08 NOTE — Assessment & Plan Note (Signed)
I reviewed the PET CT scan with him and family. I reviewed the cancer guidelines. I also reviewed his case at the hematology tumor board today. Due to his advanced age, cardiomyopathy, liver cirrhosis, chronic kidney disease and recent bacteremia, I think systemic chemotherapy would be a very high risk to the patient. I recommend weekly rituximab x 4 and then repeat imaging and EGD. If he have persistent disease, I will consider addition of lenalidomide with rituximab. If the patient achieved complete response, I would not give him any further treatment and just recommend observation

## 2014-11-09 ENCOUNTER — Telehealth: Payer: Self-pay | Admitting: *Deleted

## 2014-11-09 ENCOUNTER — Ambulatory Visit (HOSPITAL_BASED_OUTPATIENT_CLINIC_OR_DEPARTMENT_OTHER): Payer: Medicare Other

## 2014-11-09 VITALS — BP 124/56 | HR 67 | Temp 98.2°F | Resp 16

## 2014-11-09 DIAGNOSIS — Z5112 Encounter for antineoplastic immunotherapy: Secondary | ICD-10-CM

## 2014-11-09 DIAGNOSIS — C8330 Diffuse large B-cell lymphoma, unspecified site: Secondary | ICD-10-CM | POA: Diagnosis not present

## 2014-11-09 MED ORDER — DIPHENHYDRAMINE HCL 25 MG PO CAPS
50.0000 mg | ORAL_CAPSULE | Freq: Once | ORAL | Status: AC
Start: 2014-11-09 — End: 2014-11-09
  Administered 2014-11-09: 50 mg via ORAL

## 2014-11-09 MED ORDER — SODIUM CHLORIDE 0.9 % IV SOLN
Freq: Once | INTRAVENOUS | Status: AC
  Administered 2014-11-09: 09:00:00 via INTRAVENOUS

## 2014-11-09 MED ORDER — ACETAMINOPHEN 325 MG PO TABS
ORAL_TABLET | ORAL | Status: AC
  Filled 2014-11-09: qty 2

## 2014-11-09 MED ORDER — ACETAMINOPHEN 325 MG PO TABS
650.0000 mg | ORAL_TABLET | Freq: Once | ORAL | Status: AC
  Administered 2014-11-09: 650 mg via ORAL

## 2014-11-09 MED ORDER — SODIUM CHLORIDE 0.9 % IV SOLN
375.0000 mg/m2 | Freq: Once | INTRAVENOUS | Status: AC
  Administered 2014-11-09: 700 mg via INTRAVENOUS
  Filled 2014-11-09: qty 70

## 2014-11-09 MED ORDER — DIPHENHYDRAMINE HCL 25 MG PO CAPS
ORAL_CAPSULE | ORAL | Status: AC
  Filled 2014-11-09: qty 2

## 2014-11-09 NOTE — Assessment & Plan Note (Signed)
His kidney function tests is stable. This along with many other things contributed to the decision-making of not pursuing aggressive systemic chemotherapy. We will monitor his kidney function carefully while on treatment

## 2014-11-09 NOTE — Telephone Encounter (Signed)
Left message to call us if has any questions or concerns

## 2014-11-09 NOTE — Telephone Encounter (Signed)
-----   Message from Renford Dills, RN sent at 11/09/2014 12:22 PM EDT ----- Regarding: [name] [name] 1st Rituxan. Patient staying with daughter  [phone]

## 2014-11-09 NOTE — Patient Instructions (Signed)
[facility] Discharge Instructions for Patients Receiving Chemotherapy  Today you received the following chemotherapy agents Rituxan To help prevent nausea and vomiting after your treatment, we encourage you to take your nausea medication as prescribed.  If you develop nausea and vomiting that is not controlled by your nausea medication, call the clinic.   BELOW ARE SYMPTOMS THAT SHOULD BE REPORTED IMMEDIATELY:  *FEVER GREATER THAN 100.5 F  *CHILLS WITH OR WITHOUT FEVER  NAUSEA AND VOMITING THAT IS NOT CONTROLLED WITH YOUR NAUSEA MEDICATION  *UNUSUAL SHORTNESS OF BREATH  *UNUSUAL BRUISING OR BLEEDING  TENDERNESS IN MOUTH AND THROAT WITH OR WITHOUT PRESENCE OF ULCERS  *URINARY PROBLEMS  *BOWEL PROBLEMS  UNUSUAL RASH Items with * indicate a potential emergency and should be followed up as soon as possible.  Feel free to call the clinic you have any questions or concerns. The clinic phone number is [phone].  Please show the CHEMO ALERT CARD at check-in to the Emergency Department and triage nurse.   

## 2014-11-09 NOTE — Assessment & Plan Note (Signed)
He has no signs of decompensation/heart failure. We will monitor carefully while on treatment

## 2014-11-09 NOTE — Assessment & Plan Note (Signed)
This is likely anemia of chronic disease. The patient denies recent history of bleeding such as epistaxis, hematuria or hematochezia. He is asymptomatic from the anemia. We will observe for now.  He does not require transfusion now.   

## 2014-11-09 NOTE — Progress Notes (Signed)
[facility] OFFICE PROGRESS NOTE  Patient Care Team: Cassandria Anger, MD as PCP - General Patrici Ranks, MD as PCP - Hematology/Oncology (Hematology and Oncology) Gatha Mayer, MD (Gastroenterology) Evans Lance, MD (Cardiology) Judeth Horn, MD (General Surgery) Danie Binder, MD as Consulting Physician (Gastroenterology)  SUMMARY OF ONCOLOGIC HISTORY:   Diffuse large B cell lymphoma   04/30/2014 Imaging Pan CT scan after an accident showed no evidence of disease   06/17/2014 Pathology Results Accession: [id number] biopsy showed no evidence of cancer H Pylori   06/17/2014 Procedure EGD showed diffuse gastritis   10/17/2014 Procedure repeat EGD showed persistent stomach ulcer   10/17/2014 Pathology Results Accession: [id number] 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   10/26/2014 - 11/02/2014 Hospital Admission the patient was admitted for fevers and chills and was subsequently found to have bacteremia, resolved with IV anti-biotic therapy   10/26/2014 Imaging CT scan of the chest show new pulmonary nodules of unknown etiology   10/31/2014 Imaging CT scan of the abdomen show persistent liver cirrhosis but no regional lymphadenopathy   11/07/2014 Imaging PET CT scan showed no other evidence of cancer involvement    INTERVAL HISTORY: Please see below for problem oriented charting. He returns for discussion of imaging study and for further follow-up. He had one episode of chills over the weekend without fevers. It lasted for several hours. He feels well. Denies recent cough. The patient denies any recent signs or symptoms of bleeding such as spontaneous epistaxis, hematuria or hematochezia.  REVIEW OF SYSTEMS:   Eyes: Denies blurriness of vision Ears, nose, mouth, throat, and face: Denies mucositis or sore throat Respiratory: Denies cough, dyspnea or wheezes Cardiovascular: Denies palpitation, chest discomfort or lower extremity  swelling Gastrointestinal:  Denies nausea, heartburn or change in bowel habits Skin: Denies abnormal skin rashes Lymphatics: Denies new lymphadenopathy or easy bruising Neurological:Denies numbness, tingling or new weaknesses Behavioral/Psych: Mood is stable, no new changes  All other systems were reviewed with the patient and are negative.  I have reviewed the past medical history, past surgical history, social history and family history with the patient and they are unchanged from previous note.  ALLERGIES:  is allergic to aleve and ibuprofen.  MEDICATIONS:  Current Outpatient Prescriptions  Medication Sig Dispense Refill  . albuterol (PROVENTIL) (2.5 MG/3ML) 0.083% nebulizer solution USE ONE VIAL VIA NEBULIZER FOUR TIMES DAILY AS NEEDED. (Patient taking differently: Take 2.5 mg by nebulization 2 (two) times daily. USE ONE VIAL VIA NEBULIZER FOUR TIMES DAILY AS NEEDED. For shortness of breath or wheezing.) 375 mL 5  . amoxicillin-clavulanate (AUGMENTIN) 875-125 MG per tablet Take 1 tablet by mouth 2 (two) times daily. 14 tablet 0  . Ascorbic Acid (VITAMIN C PO) Take 1 tablet by mouth daily.    Marland Kitchen atorvastatin (LIPITOR) 20 MG tablet TAKE 1/2 TABLET  TWICE DAILY 90 tablet 3  . Cholecalciferol (VITAMIN D3) 1000 UNITS tablet Take 1,000 Units by mouth daily.      Marland Kitchen diltiazem 2 % GEL Apply 1 application topically 3 (three) times daily. (Patient taking differently: Apply 1 application topically 3 (three) times daily as needed. ) 30 g 3  . donepezil (ARICEPT) 10 MG tablet TAKE 1 TABLET AT BEDTIME 90 tablet 3  . fluticasone (FLONASE) 50 MCG/ACT nasal spray Place 2 sprays into both nostrils daily. 48 g 3  . folic acid (FOLVITE) 932 MCG tablet Take 800 mcg by mouth daily.     Marland Kitchen levothyroxine (SYNTHROID, LEVOTHROID)  100 MCG tablet Take 1 tablet (100 mcg total) by mouth daily. 90 tablet 3  . metoprolol succinate (TOPROL-XL) 25 MG 24 hr tablet Take 0.5 tablets (12.5 mg total) by mouth 2 (two) times  daily. 90 tablet 1  . nitroGLYCERIN (NITROSTAT) 0.4 MG SL tablet Place 1 tablet (0.4 mg total) under the tongue every 5 (five) minutes as needed. Chest pain 20 tablet 2  . pantoprazole (PROTONIX) 40 MG tablet Take 1 tablet (40 mg total) by mouth daily. (Patient taking differently: Take 40 mg by mouth 2 (two) times daily. ) 90 tablet 3  . triamcinolone (NASACORT) 55 MCG/ACT nasal inhaler Place 1 spray into the nose daily. 3 Inhaler 1  . ursodiol (ACTIGALL) 500 MG tablet Take 1 tablet by mouth 2 (two) times daily.     No current facility-administered medications for this visit.   Facility-Administered Medications Ordered in Other Visits  Medication Dose Route Frequency Provider Last Rate Last Dose  . fludeoxyglucose F - 18 (FDG) injection 7.52 milli Curie  7.52 milli Curie Intravenous Once PRN Medication Radiologist, MD   7.52 milli Curie at 11/07/14 [id number]  . riTUXimab (RITUXAN) 700 mg in sodium chloride 0.9 % 250 mL (2.1875 mg/mL) chemo infusion  375 mg/m2 (Treatment Plan Actual) Intravenous Once Heath Lark, MD        PHYSICAL EXAMINATION: ECOG PERFORMANCE STATUS: 1 - Symptomatic but completely ambulatory  Filed Vitals:   11/08/14 1424  BP: 133/54  Pulse: 72  Temp: 97.5 F (36.4 C)  Resp: 18   Filed Weights   11/08/14 1424  Weight: 156 lb 8 oz (70.988 kg)    GENERAL:alert, no distress and comfortable SKIN: skin color, texture, turgor are normal, no rashes or significant lesions EYES: normal, Conjunctiva are pink and non-injected, sclera clear OROPHARYNX:no exudate, no erythema and lips, buccal mucosa, and tongue normal  Musculoskeletal:no cyanosis of digits and no clubbing  NEURO: alert & oriented x 3 with fluent speech, no focal motor/sensory deficits  LABORATORY DATA:  I have reviewed the data as listed    Component Value Date/Time   NA 133* 11/02/2014 0440   K 4.3 11/02/2014 0440   CL 100* 11/02/2014 0440   CO2 23 11/02/2014 0440   GLUCOSE 104* 11/02/2014 0440   BUN 36*  11/02/2014 0440   CREATININE 1.71* 11/02/2014 0440   CALCIUM 8.5* 11/02/2014 0440   PROT 5.8* 11/01/2014 0514   ALBUMIN 2.7* 11/01/2014 0514   AST 40 11/01/2014 0514   ALT 18 11/01/2014 0514   ALKPHOS 195* 11/01/2014 0514   BILITOT 0.8 11/01/2014 0514   GFRNONAA 33* 11/02/2014 0440   GFRAA 39* 11/02/2014 0440    No results found for: SPEP, UPEP  Lab Results  Component Value Date   WBC 10.8* 11/01/2014   NEUTROABS 12.9* 10/26/2014   HGB 10.5* 11/01/2014   HCT 30.5* 11/01/2014   MCV 87.1 11/01/2014   PLT 144* 11/01/2014      Chemistry      Component Value Date/Time   NA 133* 11/02/2014 0440   K 4.3 11/02/2014 0440   CL 100* 11/02/2014 0440   CO2 23 11/02/2014 0440   BUN 36* 11/02/2014 0440   CREATININE 1.71* 11/02/2014 0440      Component Value Date/Time   CALCIUM 8.5* 11/02/2014 0440   ALKPHOS 195* 11/01/2014 0514   AST 40 11/01/2014 0514   ALT 18 11/01/2014 0514   BILITOT 0.8 11/01/2014 0514       RADIOGRAPHIC STUDIES: I reviewed the PET CT  scan with family I have personally reviewed the radiological images as listed and agreed with the findings in the report.  ASSESSMENT & PLAN:  Diffuse large B cell lymphoma I reviewed the PET CT scan with him and family. I reviewed the cancer guidelines. I also reviewed his case at the hematology tumor board today. Due to his advanced age, cardiomyopathy, liver cirrhosis, chronic kidney disease and recent bacteremia, I think systemic chemotherapy would be a very high risk to the patient. I recommend weekly rituximab x 4 and then repeat imaging and EGD. If he have persistent disease, I will consider addition of lenalidomide with rituximab. If the patient achieved complete response, I would not give him any further treatment and just recommend observation  CKD (chronic kidney disease), stage IV His kidney function tests is stable. This along with many other things contributed to the decision-making of not pursuing  aggressive systemic chemotherapy. We will monitor his kidney function carefully while on treatment  History of bacteremia He had recent bacteremia and will be completing a course of oral anti-biotics. I am concerned about recent chills without fever. I recommend the patient and family members to call me again if those symptoms recurred in the future. Because of many different comorbidities, I would not recommend systemic chemotherapy. His imaging studies show new lung nodules which could be signs of septic emboli. We will repeat imaging study in 3 months to follow  Chronic systolic heart failure He has no signs of decompensation/heart failure. We will monitor carefully while on treatment  Acute blood loss anemia This is likely anemia of chronic disease. The patient denies recent history of bleeding such as epistaxis, hematuria or hematochezia. He is asymptomatic from the anemia. We will observe for now.  He does not require transfusion now.       Orders Placed This Encounter  Procedures  . CBC with Differential/Platelet    Standing Status: Future     Number of Occurrences:      Standing Expiration Date: 12/13/2015  . Comprehensive metabolic panel    Standing Status: Future     Number of Occurrences:      Standing Expiration Date: 12/13/2015  . Lactate dehydrogenase    Standing Status: Future     Number of Occurrences:      Standing Expiration Date: 12/13/2015   All questions were answered. The patient knows to call the clinic with any problems, questions or concerns. No barriers to learning was detected. I spent 25 minutes counseling the patient face to face. The total time spent in the appointment was 40 minutes and more than 50% was on counseling and review of test results     [facility], Annsleigh Dragoo, MD 11/09/2014 9:43 AM

## 2014-11-09 NOTE — Assessment & Plan Note (Signed)
He had recent bacteremia and will be completing a course of oral anti-biotics. I am concerned about recent chills without fever. I recommend the patient and family members to call me again if those symptoms recurred in the future. Because of many different comorbidities, I would not recommend systemic chemotherapy. His imaging studies show new lung nodules which could be signs of septic emboli. We will repeat imaging study in 3 months to follow

## 2014-11-10 ENCOUNTER — Other Ambulatory Visit: Payer: Self-pay | Admitting: Internal Medicine

## 2014-11-16 ENCOUNTER — Encounter: Payer: Self-pay | Admitting: Hematology and Oncology

## 2014-11-16 ENCOUNTER — Ambulatory Visit (INDEPENDENT_AMBULATORY_CARE_PROVIDER_SITE_OTHER): Payer: Medicare Other | Admitting: *Deleted

## 2014-11-16 ENCOUNTER — Ambulatory Visit (HOSPITAL_BASED_OUTPATIENT_CLINIC_OR_DEPARTMENT_OTHER): Payer: Medicare Other

## 2014-11-16 ENCOUNTER — Ambulatory Visit (HOSPITAL_BASED_OUTPATIENT_CLINIC_OR_DEPARTMENT_OTHER): Payer: Medicare Other | Admitting: Hematology and Oncology

## 2014-11-16 ENCOUNTER — Telehealth: Payer: Self-pay | Admitting: Hematology and Oncology

## 2014-11-16 ENCOUNTER — Other Ambulatory Visit (HOSPITAL_BASED_OUTPATIENT_CLINIC_OR_DEPARTMENT_OTHER): Payer: Medicare Other

## 2014-11-16 VITALS — BP 130/60 | HR 61 | Temp 97.4°F | Resp 18

## 2014-11-16 VITALS — BP 131/63 | HR 84 | Temp 97.6°F | Resp 18 | Ht 64.0 in | Wt 151.5 lb

## 2014-11-16 DIAGNOSIS — Z5112 Encounter for antineoplastic immunotherapy: Secondary | ICD-10-CM

## 2014-11-16 DIAGNOSIS — I472 Ventricular tachycardia: Secondary | ICD-10-CM | POA: Diagnosis not present

## 2014-11-16 DIAGNOSIS — D62 Acute posthemorrhagic anemia: Secondary | ICD-10-CM

## 2014-11-16 DIAGNOSIS — I4729 Other ventricular tachycardia: Secondary | ICD-10-CM

## 2014-11-16 DIAGNOSIS — C8330 Diffuse large B-cell lymphoma, unspecified site: Secondary | ICD-10-CM | POA: Diagnosis not present

## 2014-11-16 DIAGNOSIS — D500 Iron deficiency anemia secondary to blood loss (chronic): Secondary | ICD-10-CM | POA: Diagnosis not present

## 2014-11-16 DIAGNOSIS — I5022 Chronic systolic (congestive) heart failure: Secondary | ICD-10-CM

## 2014-11-16 DIAGNOSIS — N184 Chronic kidney disease, stage 4 (severe): Secondary | ICD-10-CM

## 2014-11-16 LAB — CBC WITH DIFFERENTIAL/PLATELET
BASO%: 1.6 % (ref 0.0–2.0)
Basophils Absolute: 0.1 10*3/uL (ref 0.0–0.1)
EOS%: 5.2 % (ref 0.0–7.0)
Eosinophils Absolute: 0.4 10*3/uL (ref 0.0–0.5)
HCT: 34 % — ABNORMAL LOW (ref 38.4–49.9)
HGB: 11.3 g/dL — ABNORMAL LOW (ref 13.0–17.1)
LYMPH%: 15.6 % (ref 14.0–49.0)
MCH: 30.2 pg (ref 27.2–33.4)
MCHC: 33.2 g/dL (ref 32.0–36.0)
MCV: 91 fL (ref 79.3–98.0)
MONO#: 1.4 10*3/uL — ABNORMAL HIGH (ref 0.1–0.9)
MONO%: 18.3 % — ABNORMAL HIGH (ref 0.0–14.0)
NEUT#: 4.5 10*3/uL (ref 1.5–6.5)
NEUT%: 59.3 % (ref 39.0–75.0)
Platelets: 214 10*3/uL (ref 140–400)
RBC: 3.74 10*6/uL — ABNORMAL LOW (ref 4.20–5.82)
RDW: 15.9 % — ABNORMAL HIGH (ref 11.0–14.6)
WBC: 7.5 10*3/uL (ref 4.0–10.3)
lymph#: 1.2 10*3/uL (ref 0.9–3.3)

## 2014-11-16 LAB — COMPREHENSIVE METABOLIC PANEL (CC13)
ALT: 13 U/L (ref 0–55)
AST: 20 U/L (ref 5–34)
Albumin: 3.1 g/dL — ABNORMAL LOW (ref 3.5–5.0)
Alkaline Phosphatase: 147 U/L (ref 40–150)
Anion Gap: 9 mEq/L (ref 3–11)
BUN: 32.3 mg/dL — AB (ref 7.0–26.0)
CHLORIDE: 104 meq/L (ref 98–109)
CO2: 21 meq/L — AB (ref 22–29)
CREATININE: 1.5 mg/dL — AB (ref 0.7–1.3)
Calcium: 8.7 mg/dL (ref 8.4–10.4)
EGFR: 40 mL/min/{1.73_m2} — ABNORMAL LOW (ref >90)
GLUCOSE: 89 mg/dL (ref 70–140)
POTASSIUM: 4.5 meq/L (ref 3.5–5.1)
SODIUM: 134 meq/L — AB (ref 136–145)
Total Bilirubin: 0.58 mg/dL (ref 0.20–1.20)
Total Protein: 6.4 g/dL (ref 6.4–8.3)

## 2014-11-16 LAB — LACTATE DEHYDROGENASE (CC13): LDH: 228 U/L (ref 125–245)

## 2014-11-16 MED ORDER — DIPHENHYDRAMINE HCL 25 MG PO CAPS
ORAL_CAPSULE | ORAL | Status: AC
  Filled 2014-11-16: qty 2

## 2014-11-16 MED ORDER — ACETAMINOPHEN 325 MG PO TABS
650.0000 mg | ORAL_TABLET | Freq: Once | ORAL | Status: AC
Start: 2014-11-16 — End: 2014-11-16
  Administered 2014-11-16: 650 mg via ORAL

## 2014-11-16 MED ORDER — SODIUM CHLORIDE 0.9 % IV SOLN
Freq: Once | INTRAVENOUS | Status: AC
  Administered 2014-11-16: 13:00:00 via INTRAVENOUS

## 2014-11-16 MED ORDER — RITUXIMAB CHEMO INJECTION 500 MG/50ML
375.0000 mg/m2 | Freq: Once | INTRAVENOUS | Status: AC
  Administered 2014-11-16: 700 mg via INTRAVENOUS
  Filled 2014-11-16: qty 70

## 2014-11-16 MED ORDER — ACETAMINOPHEN 325 MG PO TABS
ORAL_TABLET | ORAL | Status: AC
Start: 2014-11-16 — End: 2014-11-16
  Filled 2014-11-16: qty 2

## 2014-11-16 MED ORDER — DIPHENHYDRAMINE HCL 25 MG PO CAPS
50.0000 mg | ORAL_CAPSULE | Freq: Once | ORAL | Status: AC
Start: 2014-11-16 — End: 2014-11-16
  Administered 2014-11-16: 50 mg via ORAL

## 2014-11-16 NOTE — Assessment & Plan Note (Signed)
This is likely anemia of chronic disease. The patient denies recent history of bleeding such as epistaxis, hematuria or hematochezia. He is asymptomatic from the anemia. We will observe for now.  

## 2014-11-16 NOTE — Assessment & Plan Note (Signed)
His kidney function tests is stable. This along with many other things contributed to the decision-making of not pursuing aggressive systemic chemotherapy. We will monitor his kidney function carefully while on treatment

## 2014-11-16 NOTE — Assessment & Plan Note (Signed)
I reviewed the PET CT scan with him and family. I reviewed the cancer guidelines. I also reviewed his case at the hematology tumor board today. Due to his advanced age, cardiomyopathy, liver cirrhosis, chronic kidney disease and recent bacteremia, I think systemic chemotherapy would be a very high risk to the patient. I recommend weekly rituximab x 4 and then repeat imaging and EGD. If he have persistent disease, I will consider addition of lenalidomide with rituximab. If the patient achieved complete response, I would not give him any further treatment and just recommend observation So far, he tolerated cycle 1 of treatment very well. His blood counts are recovering wonderfully. The patient has zero side effects. I will see him again in 2 weeks prior to cycle 4 of treatment.  We will proceed with treatment today without dose adjustment

## 2014-11-16 NOTE — Assessment & Plan Note (Signed)
He has no signs of decompensation/heart failure. We will monitor carefully while on treatment

## 2014-11-16 NOTE — Progress Notes (Signed)
Remote ICD transmission.   

## 2014-11-16 NOTE — Patient Instructions (Signed)

## 2014-11-16 NOTE — Progress Notes (Signed)
[facility] OFFICE PROGRESS NOTE  Patient Care Team: Cassandria Anger, MD as PCP - General Gatha Mayer, MD (Gastroenterology) Evans Lance, MD (Cardiology) Judeth Horn, MD (General Surgery) Danie Binder, MD as Consulting Physician (Gastroenterology) Heath Lark, MD as Consulting Physician (Hematology and Oncology)  SUMMARY OF ONCOLOGIC HISTORY:   Diffuse large B cell lymphoma   04/30/2014 Imaging Pan CT scan after an accident showed no evidence of disease   06/17/2014 Pathology Results Accession: [id number] biopsy showed no evidence of cancer H Pylori   06/17/2014 Procedure EGD showed diffuse gastritis   10/17/2014 Procedure repeat EGD showed persistent stomach ulcer   10/17/2014 Pathology Results Accession: [id number] 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   10/26/2014 - 11/02/2014 Hospital Admission the patient was admitted for fevers and chills and was subsequently found to have bacteremia, resolved with IV anti-biotic therapy   10/26/2014 Imaging CT scan of the chest show new pulmonary nodules of unknown etiology   10/31/2014 Imaging CT scan of the abdomen show persistent liver cirrhosis but no regional lymphadenopathy   11/07/2014 Imaging PET CT scan showed no other evidence of cancer involvement   11/09/2014 -  Chemotherapy he received weekly Rituxan    INTERVAL HISTORY: Please see below for problem oriented charting. He is seen today prior to cycle 2 of treatment. He is doing very well. He had no side effects from recent treatment whatsoever. He is eating well and gaining weight. He denies recent shortness of breath or leg swelling. The patient denies any recent signs or symptoms of bleeding such as spontaneous epistaxis, hematuria or hematochezia.  REVIEW OF SYSTEMS:   Constitutional: Denies fevers, chills or abnormal weight loss Eyes: Denies blurriness of vision Ears, nose, mouth, throat, and face: Denies mucositis or sore throat Respiratory:  Denies cough, dyspnea or wheezes Cardiovascular: Denies palpitation, chest discomfort or lower extremity swelling Gastrointestinal:  Denies nausea, heartburn or change in bowel habits Skin: Denies abnormal skin rashes Lymphatics: Denies new lymphadenopathy or easy bruising Neurological:Denies numbness, tingling or new weaknesses Behavioral/Psych: Mood is stable, no new changes  All other systems were reviewed with the patient and are negative.  I have reviewed the past medical history, past surgical history, social history and family history with the patient and they are unchanged from previous note.  ALLERGIES:  is allergic to aleve and ibuprofen.  MEDICATIONS:  Current Outpatient Prescriptions  Medication Sig Dispense Refill  . albuterol (PROVENTIL) (2.5 MG/3ML) 0.083% nebulizer solution USE ONE VIAL VIA NEBULIZER FOUR TIMES DAILY AS NEEDED. (Patient taking differently: Take 2.5 mg by nebulization 2 (two) times daily. USE ONE VIAL VIA NEBULIZER FOUR TIMES DAILY AS NEEDED. For shortness of breath or wheezing.) 375 mL 5  . Ascorbic Acid (VITAMIN C PO) Take 1 tablet by mouth daily.    Marland Kitchen atorvastatin (LIPITOR) 20 MG tablet TAKE 1/2 TABLET TWICE DAILY 90 tablet 2  . Cholecalciferol (VITAMIN D3) 1000 UNITS tablet Take 1,000 Units by mouth daily.      Marland Kitchen diltiazem 2 % GEL Apply 1 application topically 3 (three) times daily. (Patient taking differently: Apply 1 application topically 3 (three) times daily as needed. ) 30 g 3  . donepezil (ARICEPT) 10 MG tablet TAKE 1 TABLET AT BEDTIME 90 tablet 3  . fluticasone (FLONASE) 50 MCG/ACT nasal spray Place 2 sprays into both nostrils daily. 48 g 3  . folic acid (FOLVITE) 427 MCG tablet Take 800 mcg by mouth daily.     Marland Kitchen levothyroxine (  SYNTHROID, LEVOTHROID) 100 MCG tablet Take 1 tablet (100 mcg total) by mouth daily. 90 tablet 3  . metoprolol succinate (TOPROL-XL) 25 MG 24 hr tablet Take 0.5 tablets (12.5 mg total) by mouth 2 (two) times daily. 90 tablet 1   . nitroGLYCERIN (NITROSTAT) 0.4 MG SL tablet Place 1 tablet (0.4 mg total) under the tongue every 5 (five) minutes as needed. Chest pain 20 tablet 2  . pantoprazole (PROTONIX) 40 MG tablet Take 1 tablet (40 mg total) by mouth daily. (Patient taking differently: Take 40 mg by mouth 2 (two) times daily. ) 90 tablet 3  . triamcinolone (NASACORT) 55 MCG/ACT nasal inhaler Place 1 spray into the nose daily. 3 Inhaler 1  . ursodiol (ACTIGALL) 500 MG tablet Take 1 tablet by mouth 2 (two) times daily.     No current facility-administered medications for this visit.    PHYSICAL EXAMINATION: ECOG PERFORMANCE STATUS: 0 - Asymptomatic  Filed Vitals:   11/16/14 1104  BP: 131/63  Pulse: 84  Temp: 97.6 F (36.4 C)  Resp: 18   Filed Weights   11/16/14 1104  Weight: 151 lb 8 oz (68.72 kg)    GENERAL:alert, no distress and comfortable SKIN: skin color, texture, turgor are normal, no rashes or significant lesions EYES: normal, Conjunctiva are pink and non-injected, sclera clear OROPHARYNX:no exudate, no erythema and lips, buccal mucosa, and tongue normal  NECK: supple, thyroid normal size, non-tender, without nodularity LYMPH:  no palpable lymphadenopathy in the cervical, axillary or inguinal LUNGS: clear to auscultation and percussion with normal breathing effort HEART: irregular rate & rhythm and no murmurs and no lower extremity edema ABDOMEN:abdomen soft, non-tender and normal bowel sounds Musculoskeletal:no cyanosis of digits and no clubbing  NEURO: alert & oriented x 3 with fluent speech, no focal motor/sensory deficits  LABORATORY DATA:  I have reviewed the data as listed    Component Value Date/Time   NA 134* 11/16/2014 1048   NA 133* 11/02/2014 0440   K 4.5 11/16/2014 1048   K 4.3 11/02/2014 0440   CL 100* 11/02/2014 0440   CO2 21* 11/16/2014 1048   CO2 23 11/02/2014 0440   GLUCOSE 89 11/16/2014 1048   GLUCOSE 104* 11/02/2014 0440   BUN 32.3* 11/16/2014 1048   BUN 36*  11/02/2014 0440   CREATININE 1.5* 11/16/2014 1048   CREATININE 1.71* 11/02/2014 0440   CALCIUM 8.7 11/16/2014 1048   CALCIUM 8.5* 11/02/2014 0440   PROT 6.4 11/16/2014 1048   PROT 5.8* 11/01/2014 0514   ALBUMIN 3.1* 11/16/2014 1048   ALBUMIN 2.7* 11/01/2014 0514   AST 20 11/16/2014 1048   AST 40 11/01/2014 0514   ALT 13 11/16/2014 1048   ALT 18 11/01/2014 0514   ALKPHOS 147 11/16/2014 1048   ALKPHOS 195* 11/01/2014 0514   BILITOT 0.58 11/16/2014 1048   BILITOT 0.8 11/01/2014 0514   GFRNONAA 33* 11/02/2014 0440   GFRAA 39* 11/02/2014 0440    No results found for: SPEP, UPEP  Lab Results  Component Value Date   WBC 7.5 11/16/2014   NEUTROABS 4.5 11/16/2014   HGB 11.3* 11/16/2014   HCT 34.0* 11/16/2014   MCV 91.0 11/16/2014   PLT 214 11/16/2014      Chemistry      Component Value Date/Time   NA 134* 11/16/2014 1048   NA 133* 11/02/2014 0440   K 4.5 11/16/2014 1048   K 4.3 11/02/2014 0440   CL 100* 11/02/2014 0440   CO2 21* 11/16/2014 1048   CO2 23  11/02/2014 0440   BUN 32.3* 11/16/2014 1048   BUN 36* 11/02/2014 0440   CREATININE 1.5* 11/16/2014 1048   CREATININE 1.71* 11/02/2014 0440      Component Value Date/Time   CALCIUM 8.7 11/16/2014 1048   CALCIUM 8.5* 11/02/2014 0440   ALKPHOS 147 11/16/2014 1048   ALKPHOS 195* 11/01/2014 0514   AST 20 11/16/2014 1048   AST 40 11/01/2014 0514   ALT 13 11/16/2014 1048   ALT 18 11/01/2014 0514   BILITOT 0.58 11/16/2014 1048   BILITOT 0.8 11/01/2014 0514     ASSESSMENT & PLAN:  Diffuse large B cell lymphoma I reviewed the PET CT scan with him and family. I reviewed the cancer guidelines. I also reviewed his case at the hematology tumor board today. Due to his advanced age, cardiomyopathy, liver cirrhosis, chronic kidney disease and recent bacteremia, I think systemic chemotherapy would be a very high risk to the patient. I recommend weekly rituximab x 4 and then repeat imaging and EGD. If he have persistent  disease, I will consider addition of lenalidomide with rituximab. If the patient achieved complete response, I would not give him any further treatment and just recommend observation So far, he tolerated cycle 1 of treatment very well. His blood counts are recovering wonderfully. The patient has zero side effects. I will see him again in 2 weeks prior to cycle 4 of treatment.  We will proceed with treatment today without dose adjustment  Acute blood loss anemia This is likely anemia of chronic disease. The patient denies recent history of bleeding such as epistaxis, hematuria or hematochezia. He is asymptomatic from the anemia. We will observe for now.     CKD (chronic kidney disease), stage IV His kidney function tests is stable. This along with many other things contributed to the decision-making of not pursuing aggressive systemic chemotherapy. We will monitor his kidney function carefully while on treatment    Chronic systolic heart failure He has no signs of decompensation/heart failure. We will monitor carefully while on treatment     No orders of the defined types were placed in this encounter.   All questions were answered. The patient knows to call the clinic with any problems, questions or concerns. No barriers to learning was detected. I spent 15 minutes counseling the patient face to face. The total time spent in the appointment was 20 minutes and more than 50% was on counseling and review of test results     [facility], Abdirahim Flavell, MD 11/16/2014 2:14 PM

## 2014-11-16 NOTE — Telephone Encounter (Signed)
Gave and printd appt sched and avs for pt for Aug and Sept

## 2014-11-17 ENCOUNTER — Encounter: Payer: Self-pay | Admitting: Gastroenterology

## 2014-11-19 LAB — CUP PACEART REMOTE DEVICE CHECK
Battery Voltage: 2.64 V
Brady Statistic AP VP Percent: 0.01 %
Brady Statistic AS VP Percent: 0 %
Brady Statistic RA Percent Paced: 91.73 %
HIGH POWER IMPEDANCE MEASURED VALUE: 58 Ohm
HighPow Impedance: 40 Ohm
Lead Channel Impedance Value: 418 Ohm
Lead Channel Sensing Intrinsic Amplitude: 2.125 mV
Lead Channel Sensing Intrinsic Amplitude: 9.875 mV
Lead Channel Setting Pacing Amplitude: 2 V
Lead Channel Setting Sensing Sensitivity: 0.45 mV
MDC IDC MSMT LEADCHNL RA IMPEDANCE VALUE: 494 Ohm
MDC IDC MSMT LEADCHNL RA SENSING INTR AMPL: 2.125 mV
MDC IDC MSMT LEADCHNL RV SENSING INTR AMPL: 9.875 mV
MDC IDC SESS DTM: 20160824130045
MDC IDC SET LEADCHNL RV PACING AMPLITUDE: 2.5 V
MDC IDC SET LEADCHNL RV PACING PULSEWIDTH: 0.4 ms
MDC IDC SET ZONE DETECTION INTERVAL: 300 ms
MDC IDC STAT BRADY AP VS PERCENT: 91.72 %
MDC IDC STAT BRADY AS VS PERCENT: 8.27 %
MDC IDC STAT BRADY RV PERCENT PACED: 0.01 %
Zone Setting Detection Interval: 350 ms
Zone Setting Detection Interval: 380 ms
Zone Setting Detection Interval: 410 ms

## 2014-11-21 ENCOUNTER — Telehealth: Payer: Self-pay | Admitting: Gastroenterology

## 2014-11-21 NOTE — Telephone Encounter (Signed)
PLEASE CALL PATIENT REGARDING PRESCRIPTION    ONE HE WAS PRESCRIBED IS EXPENSIVE.  [phone]

## 2014-11-21 NOTE — Telephone Encounter (Signed)
Pt said his Ursodiol is costing him $90.00 monthly. Is there anything that can be done for him to get it cheaper?

## 2014-11-22 NOTE — Telephone Encounter (Signed)
Can we find out details? Maybe a generic?

## 2014-11-23 ENCOUNTER — Ambulatory Visit (HOSPITAL_BASED_OUTPATIENT_CLINIC_OR_DEPARTMENT_OTHER): Payer: Medicare Other

## 2014-11-23 VITALS — BP 126/62 | HR 58 | Temp 97.9°F | Resp 18

## 2014-11-23 DIAGNOSIS — Z5112 Encounter for antineoplastic immunotherapy: Secondary | ICD-10-CM

## 2014-11-23 DIAGNOSIS — C8330 Diffuse large B-cell lymphoma, unspecified site: Secondary | ICD-10-CM

## 2014-11-23 MED ORDER — DIPHENHYDRAMINE HCL 25 MG PO CAPS
ORAL_CAPSULE | ORAL | Status: AC
  Filled 2014-11-23: qty 2

## 2014-11-23 MED ORDER — ACETAMINOPHEN 325 MG PO TABS
ORAL_TABLET | ORAL | Status: AC
  Filled 2014-11-23: qty 2

## 2014-11-23 MED ORDER — DIPHENHYDRAMINE HCL 25 MG PO CAPS
50.0000 mg | ORAL_CAPSULE | Freq: Once | ORAL | Status: AC
  Administered 2014-11-23: 50 mg via ORAL

## 2014-11-23 MED ORDER — SODIUM CHLORIDE 0.9 % IV SOLN
Freq: Once | INTRAVENOUS | Status: AC
  Administered 2014-11-23: 13:00:00 via INTRAVENOUS

## 2014-11-23 MED ORDER — ACETAMINOPHEN 325 MG PO TABS
650.0000 mg | ORAL_TABLET | Freq: Once | ORAL | Status: AC
  Administered 2014-11-23: 650 mg via ORAL

## 2014-11-23 MED ORDER — SODIUM CHLORIDE 0.9 % IV SOLN
375.0000 mg/m2 | Freq: Once | INTRAVENOUS | Status: AC
  Administered 2014-11-23: 700 mg via INTRAVENOUS
  Filled 2014-11-23: qty 70

## 2014-11-23 NOTE — Patient Instructions (Signed)

## 2014-11-24 ENCOUNTER — Encounter: Payer: Self-pay | Admitting: Cardiology

## 2014-11-24 NOTE — Telephone Encounter (Signed)
I checked his formulary, urso is tier 3, all other medications in this class are tier 3 except actigall which is tier 4. I also checked to see if we can do a tier exception and that is only available if medication is a non-preferred medication. Since urso is on their preferred list, they will not do a tier exception.

## 2014-11-29 NOTE — Telephone Encounter (Signed)
That is ridiculous (of insurance, not you). Anyway, can we see if there are any savings cards OR if there are any programs through the company for patient assistance?

## 2014-11-29 NOTE — Telephone Encounter (Signed)
See above

## 2014-11-29 NOTE — Telephone Encounter (Signed)
Sorry, I checked the companies pt assistance and they will not approve anyone who has [employer], private insurance or medicare part D insurance. He cannot use a savings card with Medicare part D.

## 2014-11-29 NOTE — Telephone Encounter (Signed)
Dr. Oneida Alar, any recommendations?

## 2014-11-30 ENCOUNTER — Encounter: Payer: Self-pay | Admitting: Hematology and Oncology

## 2014-11-30 ENCOUNTER — Telehealth: Payer: Self-pay | Admitting: Hematology and Oncology

## 2014-11-30 ENCOUNTER — Other Ambulatory Visit (HOSPITAL_BASED_OUTPATIENT_CLINIC_OR_DEPARTMENT_OTHER): Payer: Medicare Other

## 2014-11-30 ENCOUNTER — Ambulatory Visit (HOSPITAL_BASED_OUTPATIENT_CLINIC_OR_DEPARTMENT_OTHER): Payer: Medicare Other

## 2014-11-30 ENCOUNTER — Ambulatory Visit (HOSPITAL_BASED_OUTPATIENT_CLINIC_OR_DEPARTMENT_OTHER): Payer: Medicare Other | Admitting: Hematology and Oncology

## 2014-11-30 ENCOUNTER — Other Ambulatory Visit: Payer: Self-pay | Admitting: Hematology and Oncology

## 2014-11-30 VITALS — BP 126/62 | HR 60 | Temp 97.8°F | Resp 18

## 2014-11-30 VITALS — BP 136/66 | HR 67 | Temp 98.0°F | Resp 18 | Ht 64.0 in | Wt 150.9 lb

## 2014-11-30 DIAGNOSIS — C8330 Diffuse large B-cell lymphoma, unspecified site: Secondary | ICD-10-CM

## 2014-11-30 DIAGNOSIS — N184 Chronic kidney disease, stage 4 (severe): Secondary | ICD-10-CM

## 2014-11-30 DIAGNOSIS — Z5112 Encounter for antineoplastic immunotherapy: Secondary | ICD-10-CM

## 2014-11-30 DIAGNOSIS — D62 Acute posthemorrhagic anemia: Secondary | ICD-10-CM | POA: Diagnosis not present

## 2014-11-30 DIAGNOSIS — I5022 Chronic systolic (congestive) heart failure: Secondary | ICD-10-CM | POA: Diagnosis not present

## 2014-11-30 LAB — COMPREHENSIVE METABOLIC PANEL (CC13)
ALBUMIN: 3.5 g/dL (ref 3.5–5.0)
ALK PHOS: 96 U/L (ref 40–150)
ALT: 11 U/L (ref 0–55)
ANION GAP: 8 meq/L (ref 3–11)
AST: 20 U/L (ref 5–34)
BILIRUBIN TOTAL: 0.66 mg/dL (ref 0.20–1.20)
BUN: 30.8 mg/dL — ABNORMAL HIGH (ref 7.0–26.0)
CALCIUM: 9 mg/dL (ref 8.4–10.4)
CO2: 23 mEq/L (ref 22–29)
Chloride: 104 mEq/L (ref 98–109)
Creatinine: 1.7 mg/dL — ABNORMAL HIGH (ref 0.7–1.3)
EGFR: 36 mL/min/{1.73_m2} — AB (ref >90)
Glucose: 99 mg/dl (ref 70–140)
POTASSIUM: 4.6 meq/L (ref 3.5–5.1)
Sodium: 135 mEq/L — ABNORMAL LOW (ref 136–145)
TOTAL PROTEIN: 6.6 g/dL (ref 6.4–8.3)

## 2014-11-30 LAB — CBC WITH DIFFERENTIAL/PLATELET
BASO%: 1.4 % (ref 0.0–2.0)
Basophils Absolute: 0.1 10*3/uL (ref 0.0–0.1)
EOS%: 6 % (ref 0.0–7.0)
Eosinophils Absolute: 0.5 10*3/uL (ref 0.0–0.5)
HEMATOCRIT: 37.5 % — AB (ref 38.4–49.9)
HGB: 12.5 g/dL — ABNORMAL LOW (ref 13.0–17.1)
LYMPH#: 1.1 10*3/uL (ref 0.9–3.3)
LYMPH%: 14.6 % (ref 14.0–49.0)
MCH: 30.3 pg (ref 27.2–33.4)
MCHC: 33.3 g/dL (ref 32.0–36.0)
MCV: 91.2 fL (ref 79.3–98.0)
MONO#: 1.2 10*3/uL — AB (ref 0.1–0.9)
MONO%: 15.6 % — ABNORMAL HIGH (ref 0.0–14.0)
NEUT%: 62.4 % (ref 39.0–75.0)
NEUTROS ABS: 4.8 10*3/uL (ref 1.5–6.5)
PLATELETS: 141 10*3/uL (ref 140–400)
RBC: 4.12 10*6/uL — AB (ref 4.20–5.82)
RDW: 15.7 % — ABNORMAL HIGH (ref 11.0–14.6)
WBC: 7.7 10*3/uL (ref 4.0–10.3)

## 2014-11-30 LAB — LACTATE DEHYDROGENASE (CC13): LDH: 237 U/L (ref 125–245)

## 2014-11-30 MED ORDER — SODIUM CHLORIDE 0.9 % IV SOLN
375.0000 mg/m2 | Freq: Once | INTRAVENOUS | Status: AC
  Administered 2014-11-30: 700 mg via INTRAVENOUS
  Filled 2014-11-30: qty 60

## 2014-11-30 MED ORDER — DIPHENHYDRAMINE HCL 25 MG PO CAPS
ORAL_CAPSULE | ORAL | Status: AC
  Filled 2014-11-30: qty 2

## 2014-11-30 MED ORDER — DIPHENHYDRAMINE HCL 25 MG PO CAPS
50.0000 mg | ORAL_CAPSULE | Freq: Once | ORAL | Status: AC
Start: 2014-11-30 — End: 2014-11-30
  Administered 2014-11-30: 50 mg via ORAL

## 2014-11-30 MED ORDER — ACETAMINOPHEN 325 MG PO TABS
ORAL_TABLET | ORAL | Status: AC
  Filled 2014-11-30: qty 2

## 2014-11-30 MED ORDER — ACETAMINOPHEN 325 MG PO TABS
650.0000 mg | ORAL_TABLET | Freq: Once | ORAL | Status: AC
Start: 2014-11-30 — End: 2014-11-30
  Administered 2014-11-30: 650 mg via ORAL

## 2014-11-30 MED ORDER — SODIUM CHLORIDE 0.9 % IV SOLN
Freq: Once | INTRAVENOUS | Status: AC
  Administered 2014-11-30: 11:00:00 via INTRAVENOUS

## 2014-11-30 NOTE — Assessment & Plan Note (Signed)
His kidney function tests is stable. This along with many other things contributed to the decision-making of not pursuing aggressive systemic chemotherapy. We will monitor his kidney function carefully while on treatment 

## 2014-11-30 NOTE — Telephone Encounter (Signed)
PT is aware.

## 2014-11-30 NOTE — Assessment & Plan Note (Signed)
This is likely anemia of chronic disease. The patient denies recent history of bleeding such as epistaxis, hematuria or hematochezia. He is asymptomatic from the anemia. We will observe for now.  

## 2014-11-30 NOTE — Telephone Encounter (Signed)
PLEASE CALL PT. IT IS POSSIBLE HIS LIVER ENZYMES ARE NORMAL AFTER RECEIVING TREATMENT FOR LYMPHOMA. HE MAY HOLD URSO AND WE WILL RECHECK HIS ENZYMES IN 3 MOS.  WILL DISCUSS WITH DR. Whitney Muse.

## 2014-11-30 NOTE — Progress Notes (Signed)
[place] OFFICE PROGRESS NOTE  Patient Care Team: Cassandria Anger, MD as PCP - General Gatha Mayer, MD (Gastroenterology) Evans Lance, MD (Cardiology) Judeth Horn, MD (General Surgery) Danie Binder, MD as Consulting Physician (Gastroenterology) Heath Lark, MD as Consulting Physician (Hematology and Oncology)  SUMMARY OF ONCOLOGIC HISTORY:   Diffuse large B cell lymphoma   04/30/2014 Imaging Pan CT scan after an accident showed no evidence of disease   06/17/2014 Pathology Results Accession: [id number] biopsy showed no evidence of cancer H Pylori   06/17/2014 Procedure EGD showed diffuse gastritis   10/17/2014 Procedure repeat EGD showed persistent stomach ulcer   10/17/2014 Pathology Results Accession: [id number] 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   10/26/2014 - 11/02/2014 Hospital Admission the patient was admitted for fevers and chills and was subsequently found to have bacteremia, resolved with IV anti-biotic therapy   10/26/2014 Imaging CT scan of the chest show new pulmonary nodules of unknown etiology   10/31/2014 Imaging CT scan of the abdomen show persistent liver cirrhosis but no regional lymphadenopathy   11/07/2014 Imaging PET CT scan showed no other evidence of cancer involvement   11/09/2014 -  Chemotherapy he received weekly Rituxan    INTERVAL HISTORY: Please see below for problem oriented charting. He is seen prior to cycle 4 of treatment. So far, he denies side effects of treatment. Denies recent fevers or chills. The patient denies any recent signs or symptoms of bleeding such as spontaneous epistaxis, hematuria or hematochezia.   REVIEW OF SYSTEMS:   Constitutional: Denies fevers, chills or abnormal weight loss Eyes: Denies blurriness of vision Ears, nose, mouth, throat, and face: Denies mucositis or sore throat Respiratory: Denies cough, dyspnea or wheezes Cardiovascular: Denies palpitation, chest discomfort or lower  extremity swelling Gastrointestinal:  Denies nausea, heartburn or change in bowel habits Skin: Denies abnormal skin rashes Lymphatics: Denies new lymphadenopathy or easy bruising Neurological:Denies numbness, tingling or new weaknesses Behavioral/Psych: Mood is stable, no new changes  All other systems were reviewed with the patient and are negative.  I have reviewed the past medical history, past surgical history, social history and family history with the patient and they are unchanged from previous note.  ALLERGIES:  is allergic to aleve and ibuprofen.  MEDICATIONS:  Current Outpatient Prescriptions  Medication Sig Dispense Refill  . albuterol (PROVENTIL) (2.5 MG/3ML) 0.083% nebulizer solution USE ONE VIAL VIA NEBULIZER FOUR TIMES DAILY AS NEEDED. (Patient taking differently: Take 2.5 mg by nebulization 2 (two) times daily. USE ONE VIAL VIA NEBULIZER FOUR TIMES DAILY AS NEEDED. For shortness of breath or wheezing.) 375 mL 5  . Ascorbic Acid (VITAMIN C PO) Take 1 tablet by mouth daily.    Marland Kitchen atorvastatin (LIPITOR) 20 MG tablet TAKE 1/2 TABLET TWICE DAILY 90 tablet 2  . Cholecalciferol (VITAMIN D3) 1000 UNITS tablet Take 1,000 Units by mouth daily.      Marland Kitchen diltiazem 2 % GEL Apply 1 application topically 3 (three) times daily. (Patient taking differently: Apply 1 application topically 3 (three) times daily as needed. ) 30 g 3  . donepezil (ARICEPT) 10 MG tablet TAKE 1 TABLET AT BEDTIME 90 tablet 3  . fluticasone (FLONASE) 50 MCG/ACT nasal spray Place 2 sprays into both nostrils daily. 48 g 3  . folic acid (FOLVITE) 428 MCG tablet Take 800 mcg by mouth daily.     Marland Kitchen levothyroxine (SYNTHROID, LEVOTHROID) 100 MCG tablet Take 1 tablet (100 mcg total) by mouth daily. 90 tablet 3  .  metoprolol succinate (TOPROL-XL) 25 MG 24 hr tablet Take 0.5 tablets (12.5 mg total) by mouth 2 (two) times daily. 90 tablet 1  . nitroGLYCERIN (NITROSTAT) 0.4 MG SL tablet Place 1 tablet (0.4 mg total) under the tongue  every 5 (five) minutes as needed. Chest pain 20 tablet 2  . pantoprazole (PROTONIX) 40 MG tablet Take 1 tablet (40 mg total) by mouth daily. (Patient taking differently: Take 40 mg by mouth 2 (two) times daily. ) 90 tablet 3  . triamcinolone (NASACORT) 55 MCG/ACT nasal inhaler Place 1 spray into the nose daily. 3 Inhaler 1  . ursodiol (ACTIGALL) 500 MG tablet Take 1 tablet by mouth 2 (two) times daily.     No current facility-administered medications for this visit.    PHYSICAL EXAMINATION: ECOG PERFORMANCE STATUS: 0 - Asymptomatic  Filed Vitals:   11/30/14 0950  BP: 136/66  Pulse: 67  Temp: 98 F (36.7 C)  Resp: 18   Filed Weights   11/30/14 0950  Weight: 150 lb 14.4 oz (68.448 kg)    GENERAL:alert, no distress and comfortable SKIN: skin color, texture, turgor are normal, no rashes or significant lesions EYES: normal, Conjunctiva are pink and non-injected, sclera clear OROPHARYNX:no exudate, no erythema and lips, buccal mucosa, and tongue normal  NECK: supple, thyroid normal size, non-tender, without nodularity LYMPH:  no palpable lymphadenopathy in the cervical, axillary or inguinal LUNGS: clear to auscultation and percussion with normal breathing effort HEART: regular rate & rhythm and no murmurs and no lower extremity edema ABDOMEN:abdomen soft, non-tender and normal bowel sounds Musculoskeletal:no cyanosis of digits and no clubbing  NEURO: alert & oriented x 3 with fluent speech, no focal motor/sensory deficits  LABORATORY DATA:  I have reviewed the data as listed    Component Value Date/Time   NA 135* 11/30/2014 0933   NA 133* 11/02/2014 0440   K 4.6 11/30/2014 0933   K 4.3 11/02/2014 0440   CL 100* 11/02/2014 0440   CO2 23 11/30/2014 0933   CO2 23 11/02/2014 0440   GLUCOSE 99 11/30/2014 0933   GLUCOSE 104* 11/02/2014 0440   BUN 30.8* 11/30/2014 0933   BUN 36* 11/02/2014 0440   CREATININE 1.7* 11/30/2014 0933   CREATININE 1.71* 11/02/2014 0440   CALCIUM 9.0  11/30/2014 0933   CALCIUM 8.5* 11/02/2014 0440   PROT 6.6 11/30/2014 0933   PROT 5.8* 11/01/2014 0514   ALBUMIN 3.5 11/30/2014 0933   ALBUMIN 2.7* 11/01/2014 0514   AST 20 11/30/2014 0933   AST 40 11/01/2014 0514   ALT 11 11/30/2014 0933   ALT 18 11/01/2014 0514   ALKPHOS 96 11/30/2014 0933   ALKPHOS 195* 11/01/2014 0514   BILITOT 0.66 11/30/2014 0933   BILITOT 0.8 11/01/2014 0514   GFRNONAA 33* 11/02/2014 0440   GFRAA 39* 11/02/2014 0440    No results found for: SPEP, UPEP  Lab Results  Component Value Date   WBC 7.7 11/30/2014   NEUTROABS 4.8 11/30/2014   HGB 12.5* 11/30/2014   HCT 37.5* 11/30/2014   MCV 91.2 11/30/2014   PLT 141 11/30/2014      Chemistry      Component Value Date/Time   NA 135* 11/30/2014 0933   NA 133* 11/02/2014 0440   K 4.6 11/30/2014 0933   K 4.3 11/02/2014 0440   CL 100* 11/02/2014 0440   CO2 23 11/30/2014 0933   CO2 23 11/02/2014 0440   BUN 30.8* 11/30/2014 0933   BUN 36* 11/02/2014 0440   CREATININE 1.7* 11/30/2014  4496   CREATININE 1.71* 11/02/2014 0440      Component Value Date/Time   CALCIUM 9.0 11/30/2014 0933   CALCIUM 8.5* 11/02/2014 0440   ALKPHOS 96 11/30/2014 0933   ALKPHOS 195* 11/01/2014 0514   AST 20 11/30/2014 0933   AST 40 11/01/2014 0514   ALT 11 11/30/2014 0933   ALT 18 11/01/2014 0514   BILITOT 0.66 11/30/2014 0933   BILITOT 0.8 11/01/2014 0514     ASSESSMENT & PLAN:  Diffuse large B cell lymphoma I reviewed the PET CT scan with him and family. I reviewed the cancer guidelines. I also reviewed his case at the hematology tumor board today. Due to his advanced age, cardiomyopathy, liver cirrhosis, chronic kidney disease and recent bacteremia, I think systemic chemotherapy would be a very high risk to the patient. I recommend weekly rituximab x 4. After that, I recommend another rituximab treatment in one month followed by CT scan in November. CT scan showed no evidence of disease progression, he would have  repeat EGD. If he have persistent disease, I will consider addition of lenalidomide with rituximab. If the patient achieved complete response, I would not give him any further treatment and just recommend observation So far, he tolerated treatment very well. His blood counts are recovering wonderfully. The patient has zero side effects. I will see him again in 4 weeks prior to treatment.  We will proceed with treatment today without dose adjustment  Acute blood loss anemia This is likely anemia of chronic disease. The patient denies recent history of bleeding such as epistaxis, hematuria or hematochezia. He is asymptomatic from the anemia. We will observe for now.      CKD (chronic kidney disease), stage IV His kidney function tests is stable. This along with many other things contributed to the decision-making of not pursuing aggressive systemic chemotherapy. We will monitor his kidney function carefully while on treatment    Chronic systolic heart failure He has no signs of decompensation/heart failure. We will monitor carefully while on treatment     Orders Placed This Encounter  Procedures  . CBC with Differential/Platelet    Standing Status: Future     Number of Occurrences:      Standing Expiration Date: 01/04/2016  . Comprehensive metabolic panel    Standing Status: Future     Number of Occurrences:      Standing Expiration Date: 01/04/2016  . Lactate dehydrogenase    Standing Status: Future     Number of Occurrences:      Standing Expiration Date: 01/04/2016   All questions were answered. The patient knows to call the clinic with any problems, questions or concerns. No barriers to learning was detected. I spent 20 minutes counseling the patient face to face. The total time spent in the appointment was 30 minutes and more than 50% was on counseling and review of test results     [facility], [place], MD 11/30/2014 11:31 AM

## 2014-11-30 NOTE — Assessment & Plan Note (Signed)
I reviewed the PET CT scan with him and family. I reviewed the cancer guidelines. I also reviewed his case at the hematology tumor board today. Due to his advanced age, cardiomyopathy, liver cirrhosis, chronic kidney disease and recent bacteremia, I think systemic chemotherapy would be a very high risk to the patient. I recommend weekly rituximab x 4. After that, I recommend another rituximab treatment in one month followed by CT scan in November. CT scan showed no evidence of disease progression, he would have repeat EGD. If he have persistent disease, I will consider addition of lenalidomide with rituximab. If the patient achieved complete response, I would not give him any further treatment and just recommend observation So far, he tolerated treatment very well. His blood counts are recovering wonderfully. The patient has zero side effects. I will see him again in 4 weeks prior to treatment.  We will proceed with treatment today without dose adjustment

## 2014-11-30 NOTE — Assessment & Plan Note (Signed)
He has no signs of decompensation/heart failure. We will monitor carefully while on treatment   

## 2014-11-30 NOTE — Telephone Encounter (Signed)
Appointments made and patient will get an avs in chemo

## 2014-11-30 NOTE — Patient Instructions (Addendum)
[facility] Discharge Instructions for Patients Receiving Chemotherapy  Today you received the following chemotherapy agents Rituxan  To help prevent nausea and vomiting after your treatment, we encourage you to take your nausea medication    If you develop nausea and vomiting that is not controlled by your nausea medication, call the clinic.   BELOW ARE SYMPTOMS THAT SHOULD BE REPORTED IMMEDIATELY:  *FEVER GREATER THAN 100.5 F  *CHILLS WITH OR WITHOUT FEVER  NAUSEA AND VOMITING THAT IS NOT CONTROLLED WITH YOUR NAUSEA MEDICATION  *UNUSUAL SHORTNESS OF BREATH  *UNUSUAL BRUISING OR BLEEDING  TENDERNESS IN MOUTH AND THROAT WITH OR WITHOUT PRESENCE OF ULCERS  *URINARY PROBLEMS  *BOWEL PROBLEMS  UNUSUAL RASH Items with * indicate a potential emergency and should be followed up as soon as possible.  Feel free to call the clinic you have any questions or concerns. The clinic phone number is [phone].  Please show the CHEMO ALERT CARD at check-in to the Emergency Department and triage nurse.   

## 2014-12-05 ENCOUNTER — Other Ambulatory Visit: Payer: Self-pay | Admitting: Hematology and Oncology

## 2014-12-05 ENCOUNTER — Encounter: Payer: Self-pay | Admitting: Internal Medicine

## 2014-12-12 ENCOUNTER — Other Ambulatory Visit: Payer: Self-pay | Admitting: Internal Medicine

## 2014-12-19 ENCOUNTER — Telehealth: Payer: Self-pay | Admitting: Cardiology

## 2014-12-19 ENCOUNTER — Encounter: Payer: Medicare Other | Admitting: *Deleted

## 2014-12-19 NOTE — Patient Outreach (Signed)
[facility]) Care Management  12/19/2014  [name] [name] 04-13-1922 [id number]   Referral from [employer] Tier 2 List, assigned Jon Billings, RN to outreach.  Thanks, Ronnell Freshwater. [place], [place] Assistant Phone: [phone] Fax: [phone]

## 2014-12-19 NOTE — Telephone Encounter (Signed)
Spoke with pt and reminded pt of remote transmission that is due today. Pt verbalized understanding.   

## 2014-12-20 ENCOUNTER — Encounter: Payer: Self-pay | Admitting: Cardiology

## 2014-12-20 ENCOUNTER — Other Ambulatory Visit: Payer: Self-pay

## 2014-12-20 NOTE — Patient Outreach (Signed)
[facility]) Care Management  12/20/2014  [name] [name] [birth year] [id number]   Referral Date: 12-19-14 Referral Source: [employer] Tier 2 List Dx: Heart Failure and Cancer. Patient reports he is currently being treated for cancer but treatment is about over and no problems with his cancer or heart failure   PCP: A. Plotnikov Patient reports he lives alone but daughter lives 2 doors down from him.   Denies any falls  Assessment: Patient an independent 81 year old with chronic medical problems but states he is managing well. Patient declines services at this time but is agreeable to receive information about [facility] Care Management.    Plan: RN Health Coach will send patient information about [place] will in basket [place] to close case.    Jone Baseman, RN, MSN [place] [phone]

## 2014-12-21 ENCOUNTER — Ambulatory Visit: Payer: Medicare Other | Admitting: Gastroenterology

## 2014-12-22 NOTE — Patient Outreach (Signed)
[facility]) Care Management  12/22/2014  NIKOLAS CASHER [birth year] [id number]   Notification from Jon Billings, RN to close case due to patient refused [place] Management services.  Thanks, Ronnell Freshwater. [place], [place] Assistant Phone: [phone] Fax: [phone]

## 2014-12-28 ENCOUNTER — Encounter: Payer: Self-pay | Admitting: Internal Medicine

## 2014-12-28 ENCOUNTER — Ambulatory Visit (INDEPENDENT_AMBULATORY_CARE_PROVIDER_SITE_OTHER): Payer: Medicare Other | Admitting: Internal Medicine

## 2014-12-28 VITALS — BP 122/84 | HR 84 | Temp 98.0°F | Wt 152.0 lb

## 2014-12-28 DIAGNOSIS — Z23 Encounter for immunization: Secondary | ICD-10-CM

## 2014-12-28 DIAGNOSIS — M4854XS Collapsed vertebra, not elsewhere classified, thoracic region, sequela of fracture: Secondary | ICD-10-CM | POA: Diagnosis not present

## 2014-12-28 DIAGNOSIS — I2510 Atherosclerotic heart disease of native coronary artery without angina pectoris: Secondary | ICD-10-CM | POA: Diagnosis not present

## 2014-12-28 DIAGNOSIS — K624 Stenosis of anus and rectum: Secondary | ICD-10-CM

## 2014-12-28 DIAGNOSIS — S22080S Wedge compression fracture of T11-T12 vertebra, sequela: Secondary | ICD-10-CM

## 2014-12-28 DIAGNOSIS — E034 Atrophy of thyroid (acquired): Secondary | ICD-10-CM

## 2014-12-28 DIAGNOSIS — K743 Primary biliary cirrhosis: Secondary | ICD-10-CM

## 2014-12-28 DIAGNOSIS — E038 Other specified hypothyroidism: Secondary | ICD-10-CM

## 2014-12-28 DIAGNOSIS — I10 Essential (primary) hypertension: Secondary | ICD-10-CM

## 2014-12-28 MED ORDER — METOPROLOL SUCCINATE ER 25 MG PO TB24: 12.5000 mg | ORAL_TABLET | Freq: Two times a day (BID) | ORAL | Status: DC

## 2014-12-28 MED ORDER — ALBUTEROL SULFATE (2.5 MG/3ML) 0.083% IN NEBU: 2.5000 mg | INHALATION_SOLUTION | Freq: Four times a day (QID) | RESPIRATORY_TRACT | Status: DC | PRN

## 2014-12-28 MED ORDER — DILTIAZEM GEL 2 %: 1.0000 "application " | Freq: Three times a day (TID) | CUTANEOUS | Status: DC

## 2014-12-28 NOTE — Assessment & Plan Note (Signed)
Pain has resolved He has OA pains

## 2014-12-28 NOTE — Progress Notes (Signed)
Subjective:  Patient ID: [name] [name], male    DOB: 04-15-1922  Age: 81 y.o. MRN: [id number]  CC: No chief complaint on file.   HPI HARUTYUN MONTEVERDE presents for vertebral fx, HTN, liver cirrhosis. Pt stopped Actigal per Dr Oneida Alar, GI  Outpatient Prescriptions Prior to Visit  Medication Sig Dispense Refill  . Ascorbic Acid (VITAMIN C PO) Take 1 tablet by mouth daily.    Marland Kitchen atorvastatin (LIPITOR) 20 MG tablet TAKE 1/2 TABLET TWICE DAILY 90 tablet 2  . Cholecalciferol (VITAMIN D3) 1000 UNITS tablet Take 1,000 Units by mouth daily.      Marland Kitchen donepezil (ARICEPT) 10 MG tablet TAKE 1 TABLET AT BEDTIME 90 tablet 3  . fluticasone (FLONASE) 50 MCG/ACT nasal spray Place 2 sprays into both nostrils daily. 48 g 3  . folic acid (FOLVITE) 956 MCG tablet Take 800 mcg by mouth daily.     Marland Kitchen levothyroxine (SYNTHROID, LEVOTHROID) 100 MCG tablet Take 1 tablet (100 mcg total) by mouth daily. 90 tablet 3  . nitroGLYCERIN (NITROSTAT) 0.4 MG SL tablet Place 1 tablet (0.4 mg total) under the tongue every 5 (five) minutes as needed. Chest pain 20 tablet 2  . pantoprazole (PROTONIX) 40 MG tablet Take 1 tablet (40 mg total) by mouth daily. (Patient taking differently: Take 40 mg by mouth 2 (two) times daily. ) 90 tablet 3  . triamcinolone (NASACORT) 55 MCG/ACT nasal inhaler Place 1 spray into the nose daily. 3 Inhaler 1  . albuterol (PROVENTIL) (2.5 MG/3ML) 0.083% nebulizer solution USE ONE VIAL VIA NEBULIZER FOUR TIMES DAILY AS NEEDED. 375 mL 1  . diltiazem 2 % GEL Apply 1 application topically 3 (three) times daily. (Patient taking differently: Apply 1 application topically 3 (three) times daily as needed. ) 30 g 3  . metoprolol succinate (TOPROL-XL) 25 MG 24 hr tablet Take 0.5 tablets (12.5 mg total) by mouth 2 (two) times daily. 90 tablet 1  . ursodiol (ACTIGALL) 500 MG tablet Take 1 tablet by mouth 2 (two) times daily.     No facility-administered medications prior to visit.    ROS Review of  Systems  Objective:  BP 122/84 mmHg  Pulse 84  Temp(Src) 98 F (36.7 C) (Oral)  Wt 152 lb (68.947 kg)  SpO2 96%  BP Readings from Last 3 Encounters:  12/28/14 122/84  11/30/14 126/62  11/30/14 136/66    Wt Readings from Last 3 Encounters:  12/28/14 152 lb (68.947 kg)  11/30/14 150 lb 14.4 oz (68.448 kg)  11/16/14 151 lb 8 oz (68.72 kg)    Physical Exam  Lab Results  Component Value Date   WBC 7.7 11/30/2014   HGB 12.5* 11/30/2014   HCT 37.5* 11/30/2014   PLT 141 11/30/2014   GLUCOSE 99 11/30/2014   CHOL 110 05/25/2013   TRIG 67.0 05/25/2013   HDL 40.30 05/25/2013   LDLCALC 56 05/25/2013   ALT 11 11/30/2014   AST 20 11/30/2014   NA 135* 11/30/2014   K 4.6 11/30/2014   CL 100* 11/02/2014   CREATININE 1.7* 11/30/2014   BUN 30.8* 11/30/2014   CO2 23 11/30/2014   TSH 11.336* 10/26/2014   PSA 3.42 11/28/2011   INR 1.0 09/27/2014    Nm Pet Image Initial (pi) Skull Base To Thigh  11/07/2014   CLINICAL DATA:  Initial treatment strategy for staging of lymphoma stomach. Cirrhosis. Unknown source of lung nodules. Diffuse large B-cell lymphoma.  EXAM: NUCLEAR MEDICINE PET SKULL BASE TO THIGH  TECHNIQUE: 7.5 mCi  F-18 FDG was injected intravenously. Full-ring PET imaging was performed from the skull base to thigh after the radiotracer. CT data was obtained and used for attenuation correction and anatomic localization.  FASTING BLOOD GLUCOSE:  Value: 80 mg/dl  COMPARISON:  Abdominal pelvic CT of 10/31/2014. Chest CT of 10/26/2014.  FINDINGS: NECK  A focus of hypermetabolism at the left side of the thoracic inlet is favored to be arising from the left lobe of the thyroid. This measures a S.U.V. max of 5.8, including on image 54 of series 4. There is beam hardening artifact in this area from pacer battery. However, on the contrast-enhanced CT of 04/30/2014, a subtle thyroid nodule is suspected at 11 mm. No cervical nodal hypermetabolism.  CHEST  No nodal hypermetabolism. Low-level, non  malignant range hypermetabolism about the right upper lobe pulmonary nodule. This measures 10 x 7 mm and a S.U.V. max of 1.6 on image 19. Other pulmonary nodules are likely below the resolution of PET.  ABDOMEN/PELVIS  No areas of abnormal hypermetabolism. No gastric hypermetabolism, despite the clinical history.  SKELETON  No abnormal marrow activity.  CT IMAGES PERFORMED FOR ATTENUATION CORRECTION  Incompletely imaged left frontal parietal craniotomy. Advanced cerebral atrophy. No cervical adenopathy. Chest abdomen and pelvic findings deferred to recent diagnostic CTs. Right-sided pleural thickening and calcification. Re- demonstration of multiple bilateral pulmonary nodules. Index posterior left upper lobe 5 mm nodule on image 17 is unchanged.  Cirrhosis. Cholelithiasis. Renal cortical thinning with multiple renal lesions, likely cysts and complex cysts but incompletely characterized. Gastric underdistention. Prominent porta hepatis nodes which are likely related to cirrhosis. Right-sided bladder diverticulum. Degraded evaluation of the pelvis, secondary to beam hardening artifact from right hip arthroplasty. Bilateral sacroiliac joint degenerative partial fusion.  Fat containing left inguinal hernia.  IMPRESSION: 1. Despite the clinical history, no gastric hypermetabolism or nodal hypermetabolism is identified to suggest hypermetabolic lymphoma. 2. Hypermetabolism about the left side of the thoracic inlet. Although suboptimally evaluated due to beam hardening artifact on CT images, likely at the site of a low-density left thyroid nodule on prior contrast-enhanced CT. As a significant portion of such hypermetabolic thyroid nodules are malignant, thyroid ultrasound should be considered. 3. Redemonstration of bilateral pulmonary nodules. The largest nodule demonstrates low-level, non malignant range hypermetabolism (in the right upper lobe). Other nodules are below the resolution of PET. These remain indeterminate.  Given development since 04/30/2014, considerations include metastasis from an unknown primary, an atypical appearance of lymphomatous involvement, and an infectious or inflammatory process.   Electronically Signed   By: Abigail Miyamoto M.D.   On: 11/07/2014 10:16    Assessment & Plan:   Diagnoses and all orders for this visit:  Stenosis, anal canal -     diltiazem 2 % GEL; Apply 1 application topically 3 (three) times daily.  PBC (primary biliary cirrhosis) ([place])  Essential hypertension  T12 compression fracture, sequela  Hypothyroidism due to acquired atrophy of thyroid  Need for influenza vaccination -     Flu Vaccine QUAD 36+ mos IM  Need for prophylactic vaccination and inoculation against viral hepatitis -     Hepatitis A hepatitis B combined vaccine IM  Other orders -     albuterol (PROVENTIL) (2.5 MG/3ML) 0.083% nebulizer solution; Take 3 mLs (2.5 mg total) by nebulization every 6 (six) hours as needed for wheezing or shortness of breath. -     metoprolol succinate (TOPROL-XL) 25 MG 24 hr tablet; Take 0.5 tablets (12.5 mg total) by mouth 2 (two) times  daily. -     Cancel: diltiazem 2 % GEL; Apply 1 application topically 3 (three) times daily.   I have discontinued Mr. [place] ursodiol. I have also changed his albuterol. Additionally, I am having him maintain his folic acid, cholecalciferol, triamcinolone, Ascorbic Acid (VITAMIN C PO), donepezil, fluticasone, nitroGLYCERIN, pantoprazole, levothyroxine, atorvastatin, metoprolol succinate, and diltiazem.  Meds ordered this encounter  Medications  . albuterol (PROVENTIL) (2.5 MG/3ML) 0.083% nebulizer solution    Sig: Take 3 mLs (2.5 mg total) by nebulization every 6 (six) hours as needed for wheezing or shortness of breath.    Dispense:  375 mL    Refill:  1  . metoprolol succinate (TOPROL-XL) 25 MG 24 hr tablet    Sig: Take 0.5 tablets (12.5 mg total) by mouth 2 (two) times daily.    Dispense:  90 tablet    Refill:  3  .  diltiazem 2 % GEL    Sig: Apply 1 application topically 3 (three) times daily.    Dispense:  30 g    Refill:  3     Follow-up: Return in about 3 months (around 03/30/2015) for a follow-up visit.  Walker Kehr, MD

## 2014-12-28 NOTE — Assessment & Plan Note (Signed)
Chronic On Levothroid - 

## 2014-12-28 NOTE — Assessment & Plan Note (Signed)
Chronic  On Metoprolol 

## 2014-12-28 NOTE — Assessment & Plan Note (Signed)
Ursodiol was stopped  Hep A, B vaccne #2

## 2014-12-28 NOTE — Progress Notes (Signed)
Pre visit review using our clinic review tool, if applicable. No additional management support is needed unless otherwise documented below in the visit note. 

## 2014-12-30 ENCOUNTER — Ambulatory Visit (HOSPITAL_BASED_OUTPATIENT_CLINIC_OR_DEPARTMENT_OTHER): Payer: Medicare Other | Admitting: Hematology and Oncology

## 2014-12-30 ENCOUNTER — Encounter: Payer: Self-pay | Admitting: Hematology and Oncology

## 2014-12-30 ENCOUNTER — Telehealth: Payer: Self-pay | Admitting: Hematology and Oncology

## 2014-12-30 ENCOUNTER — Other Ambulatory Visit (HOSPITAL_BASED_OUTPATIENT_CLINIC_OR_DEPARTMENT_OTHER): Payer: Medicare Other

## 2014-12-30 ENCOUNTER — Ambulatory Visit (HOSPITAL_BASED_OUTPATIENT_CLINIC_OR_DEPARTMENT_OTHER): Payer: Medicare Other

## 2014-12-30 VITALS — BP 145/77 | HR 85 | Temp 97.5°F | Resp 20 | Ht 64.0 in | Wt 150.8 lb

## 2014-12-30 VITALS — BP 124/53 | HR 59 | Resp 20

## 2014-12-30 DIAGNOSIS — Z5112 Encounter for antineoplastic immunotherapy: Secondary | ICD-10-CM | POA: Diagnosis present

## 2014-12-30 DIAGNOSIS — N184 Chronic kidney disease, stage 4 (severe): Secondary | ICD-10-CM | POA: Diagnosis not present

## 2014-12-30 DIAGNOSIS — D631 Anemia in chronic kidney disease: Secondary | ICD-10-CM

## 2014-12-30 DIAGNOSIS — C8338 Diffuse large B-cell lymphoma, lymph nodes of multiple sites: Secondary | ICD-10-CM

## 2014-12-30 DIAGNOSIS — N189 Chronic kidney disease, unspecified: Secondary | ICD-10-CM

## 2014-12-30 DIAGNOSIS — C8333 Diffuse large B-cell lymphoma, intra-abdominal lymph nodes: Secondary | ICD-10-CM

## 2014-12-30 DIAGNOSIS — C8330 Diffuse large B-cell lymphoma, unspecified site: Secondary | ICD-10-CM

## 2014-12-30 LAB — COMPREHENSIVE METABOLIC PANEL (CC13)
ALBUMIN: 3.7 g/dL (ref 3.5–5.0)
ALK PHOS: 76 U/L (ref 40–150)
ALT: 14 U/L (ref 0–55)
AST: 23 U/L (ref 5–34)
Anion Gap: 7 mEq/L (ref 3–11)
BILIRUBIN TOTAL: 0.97 mg/dL (ref 0.20–1.20)
BUN: 26.5 mg/dL — AB (ref 7.0–26.0)
CALCIUM: 9 mg/dL (ref 8.4–10.4)
CO2: 25 mEq/L (ref 22–29)
Chloride: 105 mEq/L (ref 98–109)
Creatinine: 1.6 mg/dL — ABNORMAL HIGH (ref 0.7–1.3)
EGFR: 38 mL/min/{1.73_m2} — AB (ref >90)
GLUCOSE: 93 mg/dL (ref 70–140)
Potassium: 4.5 mEq/L (ref 3.5–5.1)
SODIUM: 137 meq/L (ref 136–145)
TOTAL PROTEIN: 6.5 g/dL (ref 6.4–8.3)

## 2014-12-30 LAB — CBC WITH DIFFERENTIAL/PLATELET
BASO%: 1.1 % (ref 0.0–2.0)
Basophils Absolute: 0.1 10*3/uL (ref 0.0–0.1)
EOS ABS: 0.6 10*3/uL — AB (ref 0.0–0.5)
EOS%: 6.7 % (ref 0.0–7.0)
HEMATOCRIT: 37.9 % — AB (ref 38.4–49.9)
HEMOGLOBIN: 12.6 g/dL — AB (ref 13.0–17.1)
LYMPH#: 1.2 10*3/uL (ref 0.9–3.3)
LYMPH%: 12.8 % — ABNORMAL LOW (ref 14.0–49.0)
MCH: 30.5 pg (ref 27.2–33.4)
MCHC: 33.2 g/dL (ref 32.0–36.0)
MCV: 91.6 fL (ref 79.3–98.0)
MONO#: 1.6 10*3/uL — ABNORMAL HIGH (ref 0.1–0.9)
MONO%: 18.3 % — AB (ref 0.0–14.0)
NEUT%: 61.1 % (ref 39.0–75.0)
NEUTROS ABS: 5.5 10*3/uL (ref 1.5–6.5)
Platelets: 147 10*3/uL (ref 140–400)
RBC: 4.14 10*6/uL — ABNORMAL LOW (ref 4.20–5.82)
RDW: 14.4 % (ref 11.0–14.6)
WBC: 9 10*3/uL (ref 4.0–10.3)

## 2014-12-30 LAB — LACTATE DEHYDROGENASE (CC13): LDH: 242 U/L (ref 125–245)

## 2014-12-30 MED ORDER — SODIUM CHLORIDE 0.9 % IV SOLN
Freq: Once | INTRAVENOUS | Status: AC
  Administered 2014-12-30: 12:00:00 via INTRAVENOUS

## 2014-12-30 MED ORDER — SODIUM CHLORIDE 0.9 % IV SOLN
375.0000 mg/m2 | Freq: Once | INTRAVENOUS | Status: AC
  Administered 2014-12-30: 700 mg via INTRAVENOUS
  Filled 2014-12-30: qty 70

## 2014-12-30 MED ORDER — DIPHENHYDRAMINE HCL 25 MG PO CAPS
ORAL_CAPSULE | ORAL | Status: AC
  Filled 2014-12-30: qty 2

## 2014-12-30 MED ORDER — ACETAMINOPHEN 325 MG PO TABS
650.0000 mg | ORAL_TABLET | Freq: Once | ORAL | Status: AC
  Administered 2014-12-30: 650 mg via ORAL

## 2014-12-30 MED ORDER — ACETAMINOPHEN 325 MG PO TABS
ORAL_TABLET | ORAL | Status: AC
  Filled 2014-12-30: qty 2

## 2014-12-30 MED ORDER — DIPHENHYDRAMINE HCL 25 MG PO CAPS
50.0000 mg | ORAL_CAPSULE | Freq: Once | ORAL | Status: AC
  Administered 2014-12-30: 50 mg via ORAL

## 2014-12-30 NOTE — Assessment & Plan Note (Signed)
Due to his advanced age, cardiomyopathy, liver cirrhosis, chronic kidney disease and recent bacteremia, I think systemic chemotherapy would be a very high risk to the patient. I recommend weekly rituximab x 4 followed by maintenance treatment monthly After that, I recommend another rituximab treatment in one month followed by CT scan in November. If he have persistent disease, I will consider addition of lenalidomide with rituximab. If the patient achieved complete response, I would not give him any further treatment and just recommend observation So far, he tolerated treatment very well. His blood counts are recovering wonderfully. The patient has zero side effects. I will see him again in 4 weeks prior to treatment.  We will proceed with treatment today without dose adjustment

## 2014-12-30 NOTE — Patient Instructions (Signed)
Rituximab injection What is this medicine? RITUXIMAB (ri TUX i mab) is a monoclonal antibody. It is used commonly to treat non-Hodgkin lymphoma and other conditions. It is also used to treat rheumatoid arthritis (RA). In RA, this medicine slows the inflammatory process and help reduce joint pain and swelling. This medicine is often used with other cancer or arthritis medications. This medicine may be used for other purposes; ask your health care provider or pharmacist if you have questions. What should I tell my health care provider before I take this medicine? They need to know if you have any of these conditions: -blood disorders -heart disease -history of hepatitis B -infection (especially a virus infection such as chickenpox, cold sores, or herpes) -irregular heartbeat -kidney disease -lung or breathing disease, like asthma -lupus -an unusual or allergic reaction to rituximab, mouse proteins, other medicines, foods, dyes, or preservatives -pregnant or trying to get pregnant -breast-feeding How should I use this medicine? This medicine is for infusion into a vein. It is administered in a hospital or clinic by a specially trained health care professional. A special MedGuide will be given to you by the pharmacist with each prescription and refill. Be sure to read this information carefully each time. Talk to your pediatrician regarding the use of this medicine in children. This medicine is not approved for use in children. Overdosage: If you think you have taken too much of this medicine contact a poison control center or emergency room at once. NOTE: This medicine is only for you. Do not share this medicine with others. What if I miss a dose? It is important not to miss a dose. Call your doctor or health care professional if you are unable to keep an appointment. What may interact with this medicine? -cisplatin -medicines for blood pressure -some other medicines for  arthritis -vaccines This list may not describe all possible interactions. Give your health care provider a list of all the medicines, herbs, non-prescription drugs, or dietary supplements you use. Also tell them if you smoke, drink alcohol, or use illegal drugs. Some items may interact with your medicine. What should I watch for while using this medicine? Report any side effects that you notice during your treatment right away, such as changes in your breathing, fever, chills, dizziness or lightheadedness. These effects are more common with the first dose. Visit your prescriber or health care professional for checks on your progress. You will need to have regular blood work. Report any other side effects. The side effects of this medicine can continue after you finish your treatment. Continue your course of treatment even though you feel ill unless your doctor tells you to stop. Call your doctor or health care professional for advice if you get a fever, chills or sore throat, or other symptoms of a cold or flu. Do not treat yourself. This drug decreases your body's ability to fight infections. Try to avoid being around people who are sick. This medicine may increase your risk to bruise or bleed. Call your doctor or health care professional if you notice any unusual bleeding. Be careful brushing and flossing your teeth or using a toothpick because you may get an infection or bleed more easily. If you have any dental work done, tell your dentist you are receiving this medicine. Avoid taking products that contain aspirin, acetaminophen, ibuprofen, naproxen, or ketoprofen unless instructed by your doctor. These medicines may hide a fever. Do not become pregnant while taking this medicine. Women should inform their doctor if   they wish to become pregnant or think they might be pregnant. There is a potential for serious side effects to an unborn child. Talk to your health care professional or pharmacist for more  information. Do not breast-feed an infant while taking this medicine. What side effects may I notice from receiving this medicine? Side effects that you should report to your doctor or health care professional as soon as possible: -allergic reactions like skin rash, itching or hives, swelling of the face, lips, or tongue -low blood counts - this medicine may decrease the number of white blood cells, red blood cells and platelets. You may be at increased risk for infections and bleeding. -signs of infection - fever or chills, cough, sore throat, pain or difficulty passing urine -signs of decreased platelets or bleeding - bruising, pinpoint red spots on the skin, black, tarry stools, blood in the urine -signs of decreased red blood cells - unusually weak or tired, fainting spells, lightheadedness -breathing problems -confused, not responsive -chest pain -fast, irregular heartbeat -feeling faint or lightheaded, falls -mouth sores -redness, blistering, peeling or loosening of the skin, including inside the mouth -stomach pain -swelling of the ankles, feet, or hands -trouble passing urine or change in the amount of urine Side effects that usually do not require medical attention (report to your doctor or other health care professional if they continue or are bothersome): -anxiety -headache -loss of appetite -muscle aches -nausea -night sweats This list may not describe all possible side effects. Call your doctor for medical advice about side effects. You may report side effects to FDA at 1-800-FDA-1088. Where should I keep my medicine? This drug is given in a hospital or clinic and will not be stored at home. NOTE: This sheet is a summary. It may not cover all possible information. If you have questions about this medicine, talk to your doctor, pharmacist, or health care provider.    2016, [employer]vier/[employer] Standard. (2014-05-18 22:30:56)  

## 2014-12-30 NOTE — Assessment & Plan Note (Signed)
His kidney function tests is stable. This along with many other things contributed to the decision-making of not pursuing aggressive systemic chemotherapy. We will monitor his kidney function carefully while on treatment 

## 2014-12-30 NOTE — Assessment & Plan Note (Signed)
This is likely anemia of chronic disease. The patient denies recent history of bleeding such as epistaxis, hematuria or hematochezia. He is asymptomatic from the anemia. We will observe for now.  

## 2014-12-30 NOTE — Telephone Encounter (Signed)
Gave patient avs report and appointments for November.  °

## 2014-12-30 NOTE — Progress Notes (Signed)
OK to treat with today's labs 

## 2014-12-30 NOTE — Progress Notes (Signed)
[facility] OFFICE PROGRESS NOTE  Patient Care Team: Cassandria Anger, MD as PCP - General Gatha Mayer, MD (Gastroenterology) Evans Lance, MD (Cardiology) Judeth Horn, MD (General Surgery) Danie Binder, MD as Consulting Physician (Gastroenterology) Heath Lark, MD as Consulting Physician (Hematology and Oncology)  SUMMARY OF ONCOLOGIC HISTORY:   Diffuse large B cell lymphoma ([place])   04/30/2014 Imaging Pan CT scan after an accident showed no evidence of disease   06/17/2014 Pathology Results Accession: [id number] biopsy showed no evidence of cancer H Pylori   06/17/2014 Procedure EGD showed diffuse gastritis   10/17/2014 Procedure repeat EGD showed persistent stomach ulcer   10/17/2014 Pathology Results Accession: [id number] 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   10/26/2014 - 11/02/2014 Hospital Admission the patient was admitted for fevers and chills and was subsequently found to have bacteremia, resolved with IV anti-biotic therapy   10/26/2014 Imaging CT scan of the chest show new pulmonary nodules of unknown etiology   10/31/2014 Imaging CT scan of the abdomen show persistent liver cirrhosis but no regional lymphadenopathy   11/07/2014 Imaging PET CT scan showed no other evidence of cancer involvement   11/09/2014 -  Chemotherapy he received weekly Rituxan    INTERVAL HISTORY: Please see below for problem oriented charting. He is doing very well. Denies recent fever, chills or infection. His energy level is excellent and he started to drive again. The patient denies any recent signs or symptoms of bleeding such as spontaneous epistaxis, hematuria or hematochezia. Denies new lymphadenopathy.  REVIEW OF SYSTEMS:   Constitutional: Denies fevers, chills or abnormal weight loss Eyes: Denies blurriness of vision Ears, nose, mouth, throat, and face: Denies mucositis or sore throat Respiratory: Denies cough, dyspnea or wheezes Cardiovascular: Denies  palpitation, chest discomfort or lower extremity swelling Gastrointestinal:  Denies nausea, heartburn or change in bowel habits Skin: Denies abnormal skin rashes Lymphatics: Denies new lymphadenopathy or easy bruising Neurological:Denies numbness, tingling or new weaknesses Behavioral/Psych: Mood is stable, no new changes  All other systems were reviewed with the patient and are negative.  I have reviewed the past medical history, past surgical history, social history and family history with the patient and they are unchanged from previous note.  ALLERGIES:  is allergic to aleve and ibuprofen.  MEDICATIONS:  Current Outpatient Prescriptions  Medication Sig Dispense Refill  . albuterol (PROVENTIL) (2.5 MG/3ML) 0.083% nebulizer solution Take 3 mLs (2.5 mg total) by nebulization every 6 (six) hours as needed for wheezing or shortness of breath. 375 mL 1  . Ascorbic Acid (VITAMIN C PO) Take 1 tablet by mouth daily.    Marland Kitchen atorvastatin (LIPITOR) 20 MG tablet TAKE 1/2 TABLET TWICE DAILY 90 tablet 2  . Cholecalciferol (VITAMIN D3) 1000 UNITS tablet Take 1,000 Units by mouth daily.      Marland Kitchen diltiazem 2 % GEL Apply 1 application topically 3 (three) times daily. 30 g 3  . donepezil (ARICEPT) 10 MG tablet TAKE 1 TABLET AT BEDTIME 90 tablet 3  . fluticasone (FLONASE) 50 MCG/ACT nasal spray Place 2 sprays into both nostrils daily. 48 g 3  . folic acid (FOLVITE) 893 MCG tablet Take 800 mcg by mouth daily.     Marland Kitchen levothyroxine (SYNTHROID, LEVOTHROID) 100 MCG tablet Take 1 tablet (100 mcg total) by mouth daily. 90 tablet 3  . metoprolol succinate (TOPROL-XL) 25 MG 24 hr tablet Take 0.5 tablets (12.5 mg total) by mouth 2 (two) times daily. 90 tablet 3  . nitroGLYCERIN (NITROSTAT)  0.4 MG SL tablet Place 1 tablet (0.4 mg total) under the tongue every 5 (five) minutes as needed. Chest pain 20 tablet 2  . pantoprazole (PROTONIX) 40 MG tablet Take 1 tablet (40 mg total) by mouth daily. (Patient taking differently:  Take 40 mg by mouth 2 (two) times daily. ) 90 tablet 3  . triamcinolone (NASACORT) 55 MCG/ACT nasal inhaler Place 1 spray into the nose daily. 3 Inhaler 1   No current facility-administered medications for this visit.    PHYSICAL EXAMINATION: ECOG PERFORMANCE STATUS: 0 - Asymptomatic  Filed Vitals:   12/30/14 1046  BP: 145/77  Pulse: 85  Temp: 97.5 F (36.4 C)  Resp: 20   Filed Weights   12/30/14 1046  Weight: 150 lb 12.8 oz (68.402 kg)    GENERAL:alert, no distress and comfortable SKIN: skin color, texture, turgor are normal, no rashes or significant lesions EYES: normal, Conjunctiva are pink and non-injected, sclera clear OROPHARYNX:no exudate, no erythema and lips, buccal mucosa, and tongue normal  NECK: supple, thyroid normal size, non-tender, without nodularity LYMPH:  no palpable lymphadenopathy in the cervical, axillary or inguinal LUNGS: clear to auscultation and percussion with normal breathing effort HEART: regular rate & rhythm and no murmurs and no lower extremity edema ABDOMEN:abdomen soft, non-tender and normal bowel sounds Musculoskeletal:no cyanosis of digits and no clubbing  NEURO: alert & oriented x 3 with fluent speech, no focal motor/sensory deficits  LABORATORY DATA:  I have reviewed the data as listed    Component Value Date/Time   NA 137 12/30/2014 1033   NA 133* 11/02/2014 0440   K 4.5 12/30/2014 1033   K 4.3 11/02/2014 0440   CL 100* 11/02/2014 0440   CO2 25 12/30/2014 1033   CO2 23 11/02/2014 0440   GLUCOSE 93 12/30/2014 1033   GLUCOSE 104* 11/02/2014 0440   BUN 26.5* 12/30/2014 1033   BUN 36* 11/02/2014 0440   CREATININE 1.6* 12/30/2014 1033   CREATININE 1.71* 11/02/2014 0440   CALCIUM 9.0 12/30/2014 1033   CALCIUM 8.5* 11/02/2014 0440   PROT 6.5 12/30/2014 1033   PROT 5.8* 11/01/2014 0514   ALBUMIN 3.7 12/30/2014 1033   ALBUMIN 2.7* 11/01/2014 0514   AST 23 12/30/2014 1033   AST 40 11/01/2014 0514   ALT 14 12/30/2014 1033   ALT  18 11/01/2014 0514   ALKPHOS 76 12/30/2014 1033   ALKPHOS 195* 11/01/2014 0514   BILITOT 0.97 12/30/2014 1033   BILITOT 0.8 11/01/2014 0514   GFRNONAA 33* 11/02/2014 0440   GFRAA 39* 11/02/2014 0440    No results found for: SPEP, UPEP  Lab Results  Component Value Date   WBC 9.0 12/30/2014   NEUTROABS 5.5 12/30/2014   HGB 12.6* 12/30/2014   HCT 37.9* 12/30/2014   MCV 91.6 12/30/2014   PLT 147 12/30/2014      Chemistry      Component Value Date/Time   NA 137 12/30/2014 1033   NA 133* 11/02/2014 0440   K 4.5 12/30/2014 1033   K 4.3 11/02/2014 0440   CL 100* 11/02/2014 0440   CO2 25 12/30/2014 1033   CO2 23 11/02/2014 0440   BUN 26.5* 12/30/2014 1033   BUN 36* 11/02/2014 0440   CREATININE 1.6* 12/30/2014 1033   CREATININE 1.71* 11/02/2014 0440      Component Value Date/Time   CALCIUM 9.0 12/30/2014 1033   CALCIUM 8.5* 11/02/2014 0440   ALKPHOS 76 12/30/2014 1033   ALKPHOS 195* 11/01/2014 0514   AST 23 12/30/2014 1033  AST 40 11/01/2014 0514   ALT 14 12/30/2014 1033   ALT 18 11/01/2014 0514   BILITOT 0.97 12/30/2014 1033   BILITOT 0.8 11/01/2014 0514     ASSESSMENT & PLAN:  Diffuse large B cell lymphoma Due to his advanced age, cardiomyopathy, liver cirrhosis, chronic kidney disease and recent bacteremia, I think systemic chemotherapy would be a very high risk to the patient. I recommend weekly rituximab x 4 followed by maintenance treatment monthly After that, I recommend another rituximab treatment in one month followed by CT scan in November. If he have persistent disease, I will consider addition of lenalidomide with rituximab. If the patient achieved complete response, I would not give him any further treatment and just recommend observation So far, he tolerated treatment very well. His blood counts are recovering wonderfully. The patient has zero side effects. I will see him again in 4 weeks prior to treatment.  We will proceed with treatment today  without dose adjustment   CKD (chronic kidney disease), stage IV His kidney function tests is stable. This along with many other things contributed to the decision-making of not pursuing aggressive systemic chemotherapy. We will monitor his kidney function carefully while on treatment    Anemia in chronic renal disease This is likely anemia of chronic disease. The patient denies recent history of bleeding such as epistaxis, hematuria or hematochezia. He is asymptomatic from the anemia. We will observe for now.       Orders Placed This Encounter  Procedures  . Comprehensive metabolic panel    Standing Status: Future     Number of Occurrences:      Standing Expiration Date: 02/03/2016  . CBC with Differential/Platelet    Standing Status: Future     Number of Occurrences:      Standing Expiration Date: 02/03/2016  . Lactate dehydrogenase    Standing Status: Future     Number of Occurrences:      Standing Expiration Date: 02/03/2016   All questions were answered. The patient knows to call the clinic with any problems, questions or concerns. No barriers to learning was detected. I spent 20 minutes counseling the patient face to face. The total time spent in the appointment was 25 minutes and more than 50% was on counseling and review of test results     [facility], Angie Piercey, MD 12/30/2014 5:42 PM

## 2015-01-02 ENCOUNTER — Ambulatory Visit (INDEPENDENT_AMBULATORY_CARE_PROVIDER_SITE_OTHER): Payer: Medicare Other | Admitting: *Deleted

## 2015-01-02 DIAGNOSIS — Z95810 Presence of automatic (implantable) cardiac defibrillator: Secondary | ICD-10-CM

## 2015-01-02 LAB — CUP PACEART REMOTE DEVICE CHECK
Battery Voltage: 2.62 V
Brady Statistic AP VS Percent: 94.52 %
Brady Statistic RA Percent Paced: 94.53 %
Brady Statistic RV Percent Paced: 0.01 %
Date Time Interrogation Session: 20161010163603
HIGH POWER IMPEDANCE MEASURED VALUE: 41 Ohm
HIGH POWER IMPEDANCE MEASURED VALUE: 57 Ohm
Lead Channel Impedance Value: 494 Ohm
Lead Channel Sensing Intrinsic Amplitude: 11.125 mV
Lead Channel Sensing Intrinsic Amplitude: 2.25 mV
Lead Channel Setting Pacing Amplitude: 2 V
Lead Channel Setting Pacing Pulse Width: 0.4 ms
Lead Channel Setting Sensing Sensitivity: 0.45 mV
MDC IDC MSMT LEADCHNL RA SENSING INTR AMPL: 2.25 mV
MDC IDC MSMT LEADCHNL RV IMPEDANCE VALUE: 418 Ohm
MDC IDC MSMT LEADCHNL RV SENSING INTR AMPL: 11.125 mV
MDC IDC SET LEADCHNL RV PACING AMPLITUDE: 2.5 V
MDC IDC SET ZONE DETECTION INTERVAL: 350 ms
MDC IDC SET ZONE DETECTION INTERVAL: 410 ms
MDC IDC STAT BRADY AP VP PERCENT: 0.01 %
MDC IDC STAT BRADY AS VP PERCENT: 0 %
MDC IDC STAT BRADY AS VS PERCENT: 5.47 %
Zone Setting Detection Interval: 300 ms
Zone Setting Detection Interval: 380 ms

## 2015-01-02 NOTE — Progress Notes (Signed)
Remote ICD transmission.   

## 2015-01-03 ENCOUNTER — Other Ambulatory Visit: Payer: Self-pay

## 2015-01-03 DIAGNOSIS — K7460 Unspecified cirrhosis of liver: Secondary | ICD-10-CM

## 2015-01-04 ENCOUNTER — Encounter: Payer: Self-pay | Admitting: Cardiology

## 2015-01-16 ENCOUNTER — Encounter: Payer: Self-pay | Admitting: Internal Medicine

## 2015-01-17 ENCOUNTER — Encounter: Payer: Self-pay | Admitting: *Deleted

## 2015-01-19 ENCOUNTER — Ambulatory Visit: Payer: Medicare Other | Admitting: Gastroenterology

## 2015-01-19 ENCOUNTER — Encounter: Payer: Self-pay | Admitting: *Deleted

## 2015-01-23 ENCOUNTER — Ambulatory Visit: Payer: Medicare Other | Admitting: Gastroenterology

## 2015-01-27 ENCOUNTER — Encounter: Payer: Self-pay | Admitting: Hematology and Oncology

## 2015-01-27 ENCOUNTER — Other Ambulatory Visit (HOSPITAL_BASED_OUTPATIENT_CLINIC_OR_DEPARTMENT_OTHER): Payer: Medicare Other

## 2015-01-27 ENCOUNTER — Ambulatory Visit (HOSPITAL_BASED_OUTPATIENT_CLINIC_OR_DEPARTMENT_OTHER): Payer: Medicare Other

## 2015-01-27 ENCOUNTER — Telehealth: Payer: Self-pay | Admitting: Hematology and Oncology

## 2015-01-27 ENCOUNTER — Ambulatory Visit (HOSPITAL_BASED_OUTPATIENT_CLINIC_OR_DEPARTMENT_OTHER): Payer: Medicare Other | Admitting: Hematology and Oncology

## 2015-01-27 VITALS — BP 105/60 | HR 59 | Temp 97.8°F | Resp 20

## 2015-01-27 VITALS — BP 125/58 | HR 83 | Temp 97.1°F | Resp 18 | Ht 64.0 in | Wt 150.8 lb

## 2015-01-27 DIAGNOSIS — I5022 Chronic systolic (congestive) heart failure: Secondary | ICD-10-CM | POA: Diagnosis not present

## 2015-01-27 DIAGNOSIS — C8333 Diffuse large B-cell lymphoma, intra-abdominal lymph nodes: Secondary | ICD-10-CM

## 2015-01-27 DIAGNOSIS — C8338 Diffuse large B-cell lymphoma, lymph nodes of multiple sites: Secondary | ICD-10-CM

## 2015-01-27 DIAGNOSIS — N184 Chronic kidney disease, stage 4 (severe): Secondary | ICD-10-CM | POA: Diagnosis not present

## 2015-01-27 DIAGNOSIS — Z5112 Encounter for antineoplastic immunotherapy: Secondary | ICD-10-CM | POA: Diagnosis present

## 2015-01-27 LAB — CBC WITH DIFFERENTIAL/PLATELET
BASO%: 1.2 % (ref 0.0–2.0)
BASOS ABS: 0.1 10*3/uL (ref 0.0–0.1)
EOS ABS: 0.6 10*3/uL — AB (ref 0.0–0.5)
EOS%: 7.6 % — ABNORMAL HIGH (ref 0.0–7.0)
HCT: 39.7 % (ref 38.4–49.9)
HEMOGLOBIN: 13.1 g/dL (ref 13.0–17.1)
LYMPH%: 13.5 % — AB (ref 14.0–49.0)
MCH: 30.8 pg (ref 27.2–33.4)
MCHC: 33.1 g/dL (ref 32.0–36.0)
MCV: 93.1 fL (ref 79.3–98.0)
MONO#: 1.4 10*3/uL — AB (ref 0.1–0.9)
MONO%: 16.6 % — ABNORMAL HIGH (ref 0.0–14.0)
NEUT%: 61.1 % (ref 39.0–75.0)
NEUTROS ABS: 5.2 10*3/uL (ref 1.5–6.5)
PLATELETS: 145 10*3/uL (ref 140–400)
RBC: 4.27 10*6/uL (ref 4.20–5.82)
RDW: 13.9 % (ref 11.0–14.6)
WBC: 8.5 10*3/uL (ref 4.0–10.3)
lymph#: 1.1 10*3/uL (ref 0.9–3.3)

## 2015-01-27 LAB — COMPREHENSIVE METABOLIC PANEL (CC13)
ALT: 16 U/L (ref 0–55)
AST: 22 U/L (ref 5–34)
Albumin: 3.7 g/dL (ref 3.5–5.0)
Alkaline Phosphatase: 78 U/L (ref 40–150)
Anion Gap: 9 mEq/L (ref 3–11)
BUN: 29.3 mg/dL — AB (ref 7.0–26.0)
CALCIUM: 9.2 mg/dL (ref 8.4–10.4)
CHLORIDE: 105 meq/L (ref 98–109)
CO2: 23 meq/L (ref 22–29)
Creatinine: 1.7 mg/dL — ABNORMAL HIGH (ref 0.7–1.3)
EGFR: 35 mL/min/{1.73_m2} — ABNORMAL LOW (ref >90)
Glucose: 112 mg/dl (ref 70–140)
POTASSIUM: 4.5 meq/L (ref 3.5–5.1)
SODIUM: 136 meq/L (ref 136–145)
Total Bilirubin: 0.84 mg/dL (ref 0.20–1.20)
Total Protein: 6.7 g/dL (ref 6.4–8.3)

## 2015-01-27 LAB — LACTATE DEHYDROGENASE (CC13): LDH: 256 U/L — ABNORMAL HIGH (ref 125–245)

## 2015-01-27 MED ORDER — DIPHENHYDRAMINE HCL 25 MG PO CAPS
50.0000 mg | ORAL_CAPSULE | Freq: Once | ORAL | Status: AC
  Administered 2015-01-27: 50 mg via ORAL

## 2015-01-27 MED ORDER — SODIUM CHLORIDE 0.9 % IV SOLN
Freq: Once | INTRAVENOUS | Status: AC
  Administered 2015-01-27: 11:00:00 via INTRAVENOUS

## 2015-01-27 MED ORDER — ACETAMINOPHEN 325 MG PO TABS
ORAL_TABLET | ORAL | Status: AC
  Filled 2015-01-27: qty 2

## 2015-01-27 MED ORDER — ACETAMINOPHEN 325 MG PO TABS
650.0000 mg | ORAL_TABLET | Freq: Once | ORAL | Status: AC
  Administered 2015-01-27: 650 mg via ORAL

## 2015-01-27 MED ORDER — DIPHENHYDRAMINE HCL 25 MG PO CAPS
ORAL_CAPSULE | ORAL | Status: AC
  Filled 2015-01-27: qty 2

## 2015-01-27 MED ORDER — SODIUM CHLORIDE 0.9 % IV SOLN
375.0000 mg/m2 | Freq: Once | INTRAVENOUS | Status: AC
  Administered 2015-01-27: 700 mg via INTRAVENOUS
  Filled 2015-01-27: qty 70

## 2015-01-27 NOTE — Telephone Encounter (Signed)
Gave patient/relatives avs report and appointment for November. [facility] will call re pet - patient/relatives aware.

## 2015-01-27 NOTE — Patient Instructions (Signed)
[facility] Discharge Instructions for Patients Receiving Chemotherapy  Today you received the following chemotherapy agents rituxan  To help prevent nausea and vomiting after your treatment, we encourage you to take your nausea medication   If you develop nausea and vomiting that is not controlled by your nausea medication, call the clinic.   BELOW ARE SYMPTOMS THAT SHOULD BE REPORTED IMMEDIATELY:  *FEVER GREATER THAN 100.5 F  *CHILLS WITH OR WITHOUT FEVER  NAUSEA AND VOMITING THAT IS NOT CONTROLLED WITH YOUR NAUSEA MEDICATION  *UNUSUAL SHORTNESS OF BREATH  *UNUSUAL BRUISING OR BLEEDING  TENDERNESS IN MOUTH AND THROAT WITH OR WITHOUT PRESENCE OF ULCERS  *URINARY PROBLEMS  *BOWEL PROBLEMS  UNUSUAL RASH Items with * indicate a potential emergency and should be followed up as soon as possible.  Feel free to call the clinic you have any questions or concerns. The clinic phone number is [phone].  Please show the CHEMO ALERT CARD at check-in to the Emergency Department and triage nurse.   

## 2015-01-28 NOTE — Assessment & Plan Note (Signed)
He has no signs of decompensation/heart failure. We will monitor carefully while on treatment   

## 2015-01-28 NOTE — Assessment & Plan Note (Signed)
Due to his advanced age, cardiomyopathy, liver cirrhosis, chronic kidney disease and recent bacteremia, I think systemic chemotherapy would be a very high risk to the patient. I recommend weekly rituximab x 4 followed by maintenance treatment monthly After that, I recommend another rituximab treatment in one month followed by CT scan in November. If he have persistent disease, I will consider addition of lenalidomide with rituximab. If the patient achieved complete response, I would not give him any further treatment and just recommend observation So far, he tolerated treatment very well. His blood counts are recovering wonderfully. The patient has zero side effects. I will see him again after restaging scan. We will proceed with treatment today If scans are negative, I will refer him back to his GI  physician for repeat endoscopy

## 2015-01-28 NOTE — Assessment & Plan Note (Signed)
His kidney function tests is stable. This along with many other things contributed to the decision-making of not pursuing aggressive systemic chemotherapy. We will monitor his kidney function carefully while on treatment 

## 2015-01-28 NOTE — Progress Notes (Signed)
[place] OFFICE PROGRESS NOTE  Patient Care Team: Cassandria Anger, MD as PCP - General Gatha Mayer, MD (Gastroenterology) Evans Lance, MD (Cardiology) Judeth Horn, MD (General Surgery) Danie Binder, MD as Consulting Physician (Gastroenterology) Heath Lark, MD as Consulting Physician (Hematology and Oncology)  SUMMARY OF ONCOLOGIC HISTORY:   Diffuse large B cell lymphoma ([place])   04/30/2014 Imaging Pan CT scan after an accident showed no evidence of disease   06/17/2014 Pathology Results Accession: [id number] biopsy showed no evidence of cancer H Pylori   06/17/2014 Procedure EGD showed diffuse gastritis   10/17/2014 Procedure repeat EGD showed persistent stomach ulcer   10/17/2014 Pathology Results Accession: [id number] 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   10/26/2014 - 11/02/2014 Hospital Admission the patient was admitted for fevers and chills and was subsequently found to have bacteremia, resolved with IV anti-biotic therapy   10/26/2014 Imaging CT scan of the chest show new pulmonary nodules of unknown etiology   10/31/2014 Imaging CT scan of the abdomen show persistent liver cirrhosis but no regional lymphadenopathy   11/07/2014 Imaging PET CT scan showed no other evidence of cancer involvement   11/09/2014 -  Chemotherapy he received weekly Rituxan    INTERVAL HISTORY: Please see below for problem oriented charting. He feels well Denies recent infection The patient denies any recent signs or symptoms of bleeding such as spontaneous epistaxis, hematuria or hematochezia.   REVIEW OF SYSTEMS:   Constitutional: Denies fevers, chills or abnormal weight loss Eyes: Denies blurriness of vision Ears, nose, mouth, throat, and face: Denies mucositis or sore throat Respiratory: Denies cough, dyspnea or wheezes Cardiovascular: Denies palpitation, chest discomfort or lower extremity swelling Gastrointestinal:  Denies nausea, heartburn or change in  bowel habits Skin: Denies abnormal skin rashes Lymphatics: Denies new lymphadenopathy or easy bruising Neurological:Denies numbness, tingling or new weaknesses Behavioral/Psych: Mood is stable, no new changes  All other systems were reviewed with the patient and are negative.  I have reviewed the past medical history, past surgical history, social history and family history with the patient and they are unchanged from previous note.  ALLERGIES:  is allergic to aleve and ibuprofen.  MEDICATIONS:  Current Outpatient Prescriptions  Medication Sig Dispense Refill  . albuterol (PROVENTIL) (2.5 MG/3ML) 0.083% nebulizer solution Take 3 mLs (2.5 mg total) by nebulization every 6 (six) hours as needed for wheezing or shortness of breath. 375 mL 1  . Ascorbic Acid (VITAMIN C PO) Take 1 tablet by mouth daily.    Marland Kitchen atorvastatin (LIPITOR) 20 MG tablet TAKE 1/2 TABLET TWICE DAILY 90 tablet 2  . Cholecalciferol (VITAMIN D3) 1000 UNITS tablet Take 1,000 Units by mouth daily.      Marland Kitchen diltiazem 2 % GEL Apply 1 application topically 3 (three) times daily. 30 g 3  . donepezil (ARICEPT) 10 MG tablet TAKE 1 TABLET AT BEDTIME 90 tablet 3  . fluticasone (FLONASE) 50 MCG/ACT nasal spray Place 2 sprays into both nostrils daily. 48 g 3  . folic acid (FOLVITE) 505 MCG tablet Take 800 mcg by mouth daily.     Marland Kitchen levothyroxine (SYNTHROID, LEVOTHROID) 100 MCG tablet Take 1 tablet (100 mcg total) by mouth daily. 90 tablet 3  . metoprolol succinate (TOPROL-XL) 25 MG 24 hr tablet Take 0.5 tablets (12.5 mg total) by mouth 2 (two) times daily. 90 tablet 3  . nitroGLYCERIN (NITROSTAT) 0.4 MG SL tablet Place 1 tablet (0.4 mg total) under the tongue every 5 (five) minutes as  needed. Chest pain 20 tablet 2  . pantoprazole (PROTONIX) 40 MG tablet Take 1 tablet (40 mg total) by mouth daily. (Patient taking differently: Take 40 mg by mouth 2 (two) times daily. ) 90 tablet 3  . triamcinolone (NASACORT) 55 MCG/ACT nasal inhaler Place 1  spray into the nose daily. 3 Inhaler 1   No current facility-administered medications for this visit.    PHYSICAL EXAMINATION: ECOG PERFORMANCE STATUS: 0 - Asymptomatic  Filed Vitals:   01/27/15 1023  BP: 125/58  Pulse: 83  Temp: 97.1 F (36.2 C)  Resp: 18   Filed Weights   01/27/15 1023  Weight: 150 lb 12.8 oz (68.402 kg)    GENERAL:alert, no distress and comfortable SKIN: skin color, texture, turgor are normal, no rashes or significant lesions EYES: normal, Conjunctiva are pink and non-injected, sclera clear OROPHARYNX:no exudate, no erythema and lips, buccal mucosa, and tongue normal  NECK: supple, thyroid normal size, non-tender, without nodularity LYMPH:  no palpable lymphadenopathy in the cervical, axillary or inguinal LUNGS: clear to auscultation and percussion with normal breathing effort HEART: regular rate & rhythm and no murmurs and no lower extremity edema ABDOMEN:abdomen soft, non-tender and normal bowel sounds Musculoskeletal:no cyanosis of digits and no clubbing  NEURO: alert & oriented x 3 with fluent speech, no focal motor/sensory deficits  LABORATORY DATA:  I have reviewed the data as listed    Component Value Date/Time   NA 136 01/27/2015 1007   NA 133* 11/02/2014 0440   K 4.5 01/27/2015 1007   K 4.3 11/02/2014 0440   CL 100* 11/02/2014 0440   CO2 23 01/27/2015 1007   CO2 23 11/02/2014 0440   GLUCOSE 112 01/27/2015 1007   GLUCOSE 104* 11/02/2014 0440   BUN 29.3* 01/27/2015 1007   BUN 36* 11/02/2014 0440   CREATININE 1.7* 01/27/2015 1007   CREATININE 1.71* 11/02/2014 0440   CALCIUM 9.2 01/27/2015 1007   CALCIUM 8.5* 11/02/2014 0440   PROT 6.7 01/27/2015 1007   PROT 5.8* 11/01/2014 0514   ALBUMIN 3.7 01/27/2015 1007   ALBUMIN 2.7* 11/01/2014 0514   AST 22 01/27/2015 1007   AST 40 11/01/2014 0514   ALT 16 01/27/2015 1007   ALT 18 11/01/2014 0514   ALKPHOS 78 01/27/2015 1007   ALKPHOS 195* 11/01/2014 0514   BILITOT 0.84 01/27/2015 1007    BILITOT 0.8 11/01/2014 0514   GFRNONAA 33* 11/02/2014 0440   GFRAA 39* 11/02/2014 0440    No results found for: SPEP, UPEP  Lab Results  Component Value Date   WBC 8.5 01/27/2015   NEUTROABS 5.2 01/27/2015   HGB 13.1 01/27/2015   HCT 39.7 01/27/2015   MCV 93.1 01/27/2015   PLT 145 01/27/2015      Chemistry      Component Value Date/Time   NA 136 01/27/2015 1007   NA 133* 11/02/2014 0440   K 4.5 01/27/2015 1007   K 4.3 11/02/2014 0440   CL 100* 11/02/2014 0440   CO2 23 01/27/2015 1007   CO2 23 11/02/2014 0440   BUN 29.3* 01/27/2015 1007   BUN 36* 11/02/2014 0440   CREATININE 1.7* 01/27/2015 1007   CREATININE 1.71* 11/02/2014 0440      Component Value Date/Time   CALCIUM 9.2 01/27/2015 1007   CALCIUM 8.5* 11/02/2014 0440   ALKPHOS 78 01/27/2015 1007   ALKPHOS 195* 11/01/2014 0514   AST 22 01/27/2015 1007   AST 40 11/01/2014 0514   ALT 16 01/27/2015 1007   ALT 18 11/01/2014 0514  BILITOT 0.84 01/27/2015 1007   BILITOT 0.8 11/01/2014 0514      ASSESSMENT & PLAN:  Diffuse large B cell lymphoma Due to his advanced age, cardiomyopathy, liver cirrhosis, chronic kidney disease and recent bacteremia, I think systemic chemotherapy would be a very high risk to the patient. I recommend weekly rituximab x 4 followed by maintenance treatment monthly After that, I recommend another rituximab treatment in one month followed by CT scan in November. If he have persistent disease, I will consider addition of lenalidomide with rituximab. If the patient achieved complete response, I would not give him any further treatment and just recommend observation So far, he tolerated treatment very well. His blood counts are recovering wonderfully. The patient has zero side effects. I will see him again after restaging scan. We will proceed with treatment today If scans are negative, I will refer him back to his GI  physician for repeat endoscopy  CKD (chronic kidney disease), stage  IV His kidney function tests is stable. This along with many other things contributed to the decision-making of not pursuing aggressive systemic chemotherapy. We will monitor his kidney function carefully while on treatment   Chronic systolic heart failure He has no signs of decompensation/heart failure. We will monitor carefully while on treatment    Orders Placed This Encounter  Procedures  . NM PET Image Restag (PS) Skull Base To Thigh    Standing Status: Future     Number of Occurrences:      Standing Expiration Date: 03/28/2016    Order Specific Question:  Reason for Exam (SYMPTOM  OR DIAGNOSIS REQUIRED)    Answer:  staging lymphoma of stomach    Order Specific Question:  Preferred imaging location?    Answer:  [facility]   All questions were answered. The patient knows to call the clinic with any problems, questions or concerns. No barriers to learning was detected. I spent 20 minutes counseling the patient face to face. The total time spent in the appointment was 25 minutes and more than 50% was on counseling and review of test results     [facility], [place], MD 01/28/2015 9:13 AM

## 2015-02-02 ENCOUNTER — Ambulatory Visit (INDEPENDENT_AMBULATORY_CARE_PROVIDER_SITE_OTHER): Payer: Medicare Other | Admitting: *Deleted

## 2015-02-02 DIAGNOSIS — I472 Ventricular tachycardia: Secondary | ICD-10-CM | POA: Diagnosis not present

## 2015-02-02 DIAGNOSIS — I5022 Chronic systolic (congestive) heart failure: Secondary | ICD-10-CM | POA: Diagnosis not present

## 2015-02-02 DIAGNOSIS — I4729 Other ventricular tachycardia: Secondary | ICD-10-CM

## 2015-02-02 NOTE — Progress Notes (Signed)
Remote ICD transmission.   

## 2015-02-03 ENCOUNTER — Encounter: Payer: Self-pay | Admitting: Cardiology

## 2015-02-03 LAB — CUP PACEART REMOTE DEVICE CHECK
Battery Voltage: 2.62 V
Brady Statistic AP VP Percent: 0.02 %
Brady Statistic AS VS Percent: 1.07 %
Brady Statistic RA Percent Paced: 98.93 %
Brady Statistic RV Percent Paced: 0.02 %
Date Time Interrogation Session: 20161110161009
HIGH POWER IMPEDANCE MEASURED VALUE: 44 Ohm
HIGH POWER IMPEDANCE MEASURED VALUE: 61 Ohm
Implantable Lead Implant Date: 20031204
Implantable Lead Location: 753859
Implantable Lead Model: 6947
Lead Channel Impedance Value: 532 Ohm
Lead Channel Sensing Intrinsic Amplitude: 11.125 mV
Lead Channel Sensing Intrinsic Amplitude: 2 mV
Lead Channel Sensing Intrinsic Amplitude: 2 mV
Lead Channel Setting Pacing Amplitude: 2.5 V
Lead Channel Setting Pacing Pulse Width: 0.4 ms
MDC IDC LEAD IMPLANT DT: 20031204
MDC IDC LEAD LOCATION: 753860
MDC IDC MSMT LEADCHNL RV IMPEDANCE VALUE: 418 Ohm
MDC IDC MSMT LEADCHNL RV SENSING INTR AMPL: 11.125 mV
MDC IDC SET LEADCHNL RA PACING AMPLITUDE: 2 V
MDC IDC SET LEADCHNL RV SENSING SENSITIVITY: 0.45 mV
MDC IDC STAT BRADY AP VS PERCENT: 98.91 %
MDC IDC STAT BRADY AS VP PERCENT: 0 %

## 2015-02-07 ENCOUNTER — Ambulatory Visit (HOSPITAL_COMMUNITY)
Admission: RE | Admit: 2015-02-07 | Discharge: 2015-02-07 | Disposition: A | Discharge status: Home / Self Care | Payer: Medicare Other | Source: Ambulatory Visit | Attending: Hematology and Oncology | Admitting: Hematology and Oncology

## 2015-02-07 DIAGNOSIS — M1990 Unspecified osteoarthritis, unspecified site: Secondary | ICD-10-CM | POA: Diagnosis not present

## 2015-02-07 DIAGNOSIS — N400 Enlarged prostate without lower urinary tract symptoms: Secondary | ICD-10-CM | POA: Diagnosis not present

## 2015-02-07 DIAGNOSIS — K2970 Gastritis, unspecified, without bleeding: Secondary | ICD-10-CM | POA: Diagnosis not present

## 2015-02-07 DIAGNOSIS — J449 Chronic obstructive pulmonary disease, unspecified: Secondary | ICD-10-CM | POA: Diagnosis not present

## 2015-02-07 DIAGNOSIS — Z8572 Personal history of non-Hodgkin lymphomas: Secondary | ICD-10-CM | POA: Diagnosis not present

## 2015-02-07 DIAGNOSIS — Z95810 Presence of automatic (implantable) cardiac defibrillator: Secondary | ICD-10-CM | POA: Diagnosis not present

## 2015-02-07 DIAGNOSIS — Z7951 Long term (current) use of inhaled steroids: Secondary | ICD-10-CM | POA: Diagnosis not present

## 2015-02-07 DIAGNOSIS — Z87891 Personal history of nicotine dependence: Secondary | ICD-10-CM | POA: Diagnosis not present

## 2015-02-07 DIAGNOSIS — Z08 Encounter for follow-up examination after completed treatment for malignant neoplasm: Secondary | ICD-10-CM | POA: Diagnosis not present

## 2015-02-07 DIAGNOSIS — Z79899 Other long term (current) drug therapy: Secondary | ICD-10-CM | POA: Diagnosis not present

## 2015-02-07 DIAGNOSIS — E039 Hypothyroidism, unspecified: Secondary | ICD-10-CM | POA: Diagnosis not present

## 2015-02-07 DIAGNOSIS — I10 Essential (primary) hypertension: Secondary | ICD-10-CM | POA: Diagnosis not present

## 2015-02-07 DIAGNOSIS — Z8249 Family history of ischemic heart disease and other diseases of the circulatory system: Secondary | ICD-10-CM | POA: Diagnosis not present

## 2015-02-07 DIAGNOSIS — I2510 Atherosclerotic heart disease of native coronary artery without angina pectoris: Secondary | ICD-10-CM | POA: Diagnosis not present

## 2015-02-07 DIAGNOSIS — C8338 Diffuse large B-cell lymphoma, lymph nodes of multiple sites: Secondary | ICD-10-CM

## 2015-02-07 DIAGNOSIS — K319 Disease of stomach and duodenum, unspecified: Secondary | ICD-10-CM | POA: Diagnosis present

## 2015-02-07 DIAGNOSIS — C8599 Non-Hodgkin lymphoma, unspecified, extranodal and solid organ sites: Secondary | ICD-10-CM | POA: Diagnosis not present

## 2015-02-07 LAB — GLUCOSE, CAPILLARY: Glucose-Capillary: 99 mg/dL (ref 65–99)

## 2015-02-07 MED ORDER — FLUDEOXYGLUCOSE F - 18 (FDG) INJECTION
7.8900 | Freq: Once | INTRAVENOUS | Status: DC | PRN
  Administered 2015-02-07: 7.89 via INTRAVENOUS
  Filled 2015-02-07: qty 7.89

## 2015-02-09 ENCOUNTER — Telehealth: Payer: Self-pay | Admitting: Gastroenterology

## 2015-02-09 ENCOUNTER — Encounter: Payer: Self-pay | Admitting: Hematology and Oncology

## 2015-02-09 ENCOUNTER — Telehealth: Payer: Self-pay | Admitting: Hematology and Oncology

## 2015-02-09 ENCOUNTER — Other Ambulatory Visit: Payer: Self-pay

## 2015-02-09 ENCOUNTER — Telehealth: Payer: Self-pay | Admitting: *Deleted

## 2015-02-09 ENCOUNTER — Ambulatory Visit (HOSPITAL_BASED_OUTPATIENT_CLINIC_OR_DEPARTMENT_OTHER): Payer: Medicare Other | Admitting: Hematology and Oncology

## 2015-02-09 VITALS — BP 138/69 | HR 87 | Temp 97.6°F | Resp 18 | Ht 64.0 in | Wt 152.1 lb

## 2015-02-09 DIAGNOSIS — Z8572 Personal history of non-Hodgkin lymphomas: Secondary | ICD-10-CM | POA: Diagnosis not present

## 2015-02-09 DIAGNOSIS — IMO0001 Reserved for inherently not codable concepts without codable children: Secondary | ICD-10-CM

## 2015-02-09 DIAGNOSIS — C8599 Non-Hodgkin lymphoma, unspecified, extranodal and solid organ sites: Secondary | ICD-10-CM

## 2015-02-09 DIAGNOSIS — R918 Other nonspecific abnormal finding of lung field: Secondary | ICD-10-CM | POA: Diagnosis not present

## 2015-02-09 DIAGNOSIS — C8333 Diffuse large B-cell lymphoma, intra-abdominal lymph nodes: Secondary | ICD-10-CM

## 2015-02-09 MED ORDER — ALBUTEROL SULFATE (2.5 MG/3ML) 0.083% IN NEBU: 2.5000 mg | INHALATION_SOLUTION | Freq: Four times a day (QID) | RESPIRATORY_TRACT | Status: DC | PRN

## 2015-02-09 NOTE — Telephone Encounter (Signed)
-----   Message from Heath Lark, MD sent at 02/09/2015  1:16 PM EST ----- Regarding: GI office Pls let Dr. Oneida Alar ([facility]) GI doctor he needs follow-up EGD I routed her a copy of my progress notes today

## 2015-02-09 NOTE — Telephone Encounter (Signed)
Tammy from Dr Cal[facility] Dach office called to say that patient was seen there today and Dr Alvy Bimler wants patient to have a follow up EGD.  They are on epic and should be sending a referral and she asked for SF to be on the look out for his notes from today.

## 2015-02-09 NOTE — Progress Notes (Signed)
[place] OFFICE PROGRESS NOTE  Patient Care Team: Cassandria Anger, MD as PCP - General Gatha Mayer, MD (Gastroenterology) Evans Lance, MD (Cardiology) Judeth Horn, MD (General Surgery) Danie Binder, MD as Consulting Physician (Gastroenterology) Heath Lark, MD as Consulting Physician (Hematology and Oncology)  SUMMARY OF ONCOLOGIC HISTORY:   Diffuse large B cell lymphoma ([place])   04/30/2014 Imaging Pan CT scan after an accident showed no evidence of disease   06/17/2014 Pathology Results Accession: [id number] biopsy showed no evidence of cancer H Pylori   06/17/2014 Procedure EGD showed diffuse gastritis   10/17/2014 Procedure repeat EGD showed persistent stomach ulcer   10/17/2014 Pathology Results Accession: [id number] 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   10/26/2014 - 11/02/2014 Hospital Admission the patient was admitted for fevers and chills and was subsequently found to have bacteremia, resolved with IV anti-biotic therapy   10/26/2014 Imaging CT scan of the chest show new pulmonary nodules of unknown etiology   10/31/2014 Imaging CT scan of the abdomen show persistent liver cirrhosis but no regional lymphadenopathy   11/07/2014 Imaging PET CT scan showed no other evidence of cancer involvement   11/09/2014 - 01/27/2015 Chemotherapy he received weekly Rituxan x 4 then 2 more doses   02/07/2015 Imaging PEt scan showed no evidence of disease    INTERVAL HISTORY: Please see below for problem oriented charting. He returns for further follow-up. He feels well. Denies recent infection. The patient denies any recent signs or symptoms of bleeding such as spontaneous epistaxis, hematuria or hematochezia.   REVIEW OF SYSTEMS:   Constitutional: Denies fevers, chills or abnormal weight loss Eyes: Denies blurriness of vision Ears, nose, mouth, throat, and face: Denies mucositis or sore throat Respiratory: Denies cough, dyspnea or  wheezes Cardiovascular: Denies palpitation, chest discomfort or lower extremity swelling Gastrointestinal:  Denies nausea, heartburn or change in bowel habits Skin: Denies abnormal skin rashes Lymphatics: Denies new lymphadenopathy or easy bruising Neurological:Denies numbness, tingling or new weaknesses Behavioral/Psych: Mood is stable, no new changes  All other systems were reviewed with the patient and are negative.  I have reviewed the past medical history, past surgical history, social history and family history with the patient and they are unchanged from previous note.  ALLERGIES:  is allergic to aleve and ibuprofen.  MEDICATIONS:  Current Outpatient Prescriptions  Medication Sig Dispense Refill  . albuterol (PROVENTIL) (2.5 MG/3ML) 0.083% nebulizer solution Take 3 mLs (2.5 mg total) by nebulization every 6 (six) hours as needed for wheezing or shortness of breath. 375 mL 1  . Ascorbic Acid (VITAMIN C PO) Take 1 tablet by mouth daily.    Marland Kitchen atorvastatin (LIPITOR) 20 MG tablet TAKE 1/2 TABLET TWICE DAILY 90 tablet 2  . Cholecalciferol (VITAMIN D3) 1000 UNITS tablet Take 1,000 Units by mouth daily.      Marland Kitchen diltiazem 2 % GEL Apply 1 application topically 3 (three) times daily. 30 g 3  . donepezil (ARICEPT) 10 MG tablet TAKE 1 TABLET AT BEDTIME 90 tablet 3  . fluticasone (FLONASE) 50 MCG/ACT nasal spray Place 2 sprays into both nostrils daily. 48 g 3  . folic acid (FOLVITE) [id number] MCG tablet Take 800 mcg by mouth daily.     Marland Kitchen levothyroxine (SYNTHROID, LEVOTHROID) 100 MCG tablet Take 1 tablet (100 mcg total) by mouth daily. 90 tablet 3  . metoprolol succinate (TOPROL-XL) 25 MG 24 hr tablet Take 0.5 tablets (12.5 mg total) by mouth 2 (two) times daily. 90 tablet 3  .  nitroGLYCERIN (NITROSTAT) 0.4 MG SL tablet Place 1 tablet (0.4 mg total) under the tongue every 5 (five) minutes as needed. Chest pain 20 tablet 2  . pantoprazole (PROTONIX) 40 MG tablet Take 1 tablet (40 mg total) by mouth daily.  (Patient taking differently: Take 40 mg by mouth 2 (two) times daily. ) 90 tablet 3  . triamcinolone (NASACORT) 55 MCG/ACT nasal inhaler Place 1 spray into the nose daily. 3 Inhaler 1   No current facility-administered medications for this visit.   Facility-Administered Medications Ordered in Other Visits  Medication Dose Route Frequency Provider Last Rate Last Dose  . fludeoxyglucose F - 18 (FDG) injection 7.89 milli Curie  7.89 milli Curie Intravenous Once PRN Heath Lark, MD   7.89 milli Curie at 02/07/15 1455    PHYSICAL EXAMINATION: ECOG PERFORMANCE STATUS: 0 - Asymptomatic  Filed Vitals:   02/09/15 1125  BP: 138/69  Pulse: 87  Temp: 97.6 F (36.4 C)  Resp: 18   Filed Weights   02/09/15 1125  Weight: 152 lb 1.6 oz (68.992 kg)    GENERAL:alert, no distress and comfortable SKIN: skin color, texture, turgor are normal, no rashes or significant lesions EYES: normal, Conjunctiva are pink and non-injected, sclera clear Musculoskeletal:no cyanosis of digits and no clubbing  NEURO: alert & oriented x 3 with fluent speech, no focal motor/sensory deficits  LABORATORY DATA:  I have reviewed the data as listed    Component Value Date/Time   NA 136 01/27/2015 1007   NA 133* 11/02/2014 0440   K 4.5 01/27/2015 1007   K 4.3 11/02/2014 0440   CL 100* 11/02/2014 0440   CO2 23 01/27/2015 1007   CO2 23 11/02/2014 0440   GLUCOSE 112 01/27/2015 1007   GLUCOSE 104* 11/02/2014 0440   BUN 29.3* 01/27/2015 1007   BUN 36* 11/02/2014 0440   CREATININE 1.7* 01/27/2015 1007   CREATININE 1.71* 11/02/2014 0440   CALCIUM 9.2 01/27/2015 1007   CALCIUM 8.5* 11/02/2014 0440   PROT 6.7 01/27/2015 1007   PROT 5.8* 11/01/2014 0514   ALBUMIN 3.7 01/27/2015 1007   ALBUMIN 2.7* 11/01/2014 0514   AST 22 01/27/2015 1007   AST 40 11/01/2014 0514   ALT 16 01/27/2015 1007   ALT 18 11/01/2014 0514   ALKPHOS 78 01/27/2015 1007   ALKPHOS 195* 11/01/2014 0514   BILITOT 0.84 01/27/2015 1007   BILITOT  0.8 11/01/2014 0514   GFRNONAA 33* 11/02/2014 0440   GFRAA 39* 11/02/2014 0440    No results found for: SPEP, UPEP  Lab Results  Component Value Date   WBC 8.5 01/27/2015   NEUTROABS 5.2 01/27/2015   HGB 13.1 01/27/2015   HCT 39.7 01/27/2015   MCV 93.1 01/27/2015   PLT 145 01/27/2015      Chemistry      Component Value Date/Time   NA 136 01/27/2015 1007   NA 133* 11/02/2014 0440   K 4.5 01/27/2015 1007   K 4.3 11/02/2014 0440   CL 100* 11/02/2014 0440   CO2 23 01/27/2015 1007   CO2 23 11/02/2014 0440   BUN 29.3* 01/27/2015 1007   BUN 36* 11/02/2014 0440   CREATININE 1.7* 01/27/2015 1007   CREATININE 1.71* 11/02/2014 0440      Component Value Date/Time   CALCIUM 9.2 01/27/2015 1007   CALCIUM 8.5* 11/02/2014 0440   ALKPHOS 78 01/27/2015 1007   ALKPHOS 195* 11/01/2014 0514   AST 22 01/27/2015 1007   AST 40 11/01/2014 0514   ALT 16 01/27/2015 1007  ALT 18 11/01/2014 0514   BILITOT 0.84 01/27/2015 1007   BILITOT 0.8 11/01/2014 0514       RADIOGRAPHIC STUDIES: I have personally reviewed the radiological images as listed and agreed with the findings in the report. Nm Pet Image Restag (ps) Skull Base To Thigh  02/07/2015  CLINICAL DATA:  Subsequent treatment strategy for diffuse large B-cell lymphoma. Lymphoma of the stomach. Patient status post rituximab therapy. EXAM: NUCLEAR MEDICINE PET SKULL BASE TO THIGH TECHNIQUE: 7.9 mCi F-18 FDG was injected intravenously. Full-ring PET imaging was performed from the skull base to thigh after the radiotracer. CT data was obtained and used for attenuation correction and anatomic localization. FASTING BLOOD GLUCOSE:  Value: 99 mg/dl COMPARISON:  PET-CT 11/07/2014 FINDINGS: NECK Again demonstrated a hypermetabolic focus LEFT adjacent to the thyroid bed (image 47 of the fused data set). This lesion has slightly decreased metabolic activity compared to prior with SUV max equal 4.8 compared to 5.8. CHEST No hypermetabolic mediastinal or  hilar nodes. No suspicious pulmonary nodules on the CT scan. ABDOMEN/PELVIS No abnormal metabolic activity associated with the stomach. There is mild metabolic activity which is similar prior and likely physiologic. Mild activity throughout the bowel is physiologic. Large renal cysts again noted. No abnormal metabolic activity in the liver. No pelvic hypermetabolic lymph nodes. Bladder diverticulum posterior RIGHT of the bladder. SKELETON No focal hypermetabolic activity to suggest skeletal metastasis. IMPRESSION: 1. Mild activity through the stomach is likely physiologic. No clear evidence lymphoma of the stomach. 2. No hypermetabolic lymph nodes in the chest abdomen pelvis. 3. Bladder diverticulum posteriorly RIGHT of bladder. 4. Small hypermetabolic nodule nodule in the LEFT neck likely is a thyroid nodule. No change from prior. Electronically Signed   By: Suzy Bouchard M.D.   On: 02/07/2015 16:30     ASSESSMENT & PLAN:  Diffuse large B cell lymphoma  The PET CT scan showed no evidence of active disease. I discussed with him and family the importance of having repeat endoscopy and random biopsy to exclude minimum residual disease. I will refer him back to his gastroenterologist for further management. From the lymphoma standpoint, he had only stage I disease. I plan to follow with history, physical examination and blood work only in 3 months and to defer imaging study unless he has signs and symptoms suspicious for clinical recurrence   Orders Placed This Encounter  Procedures  . CBC with Differential/Platelet    Standing Status: Future     Number of Occurrences:      Standing Expiration Date: 03/15/2016  . Comprehensive metabolic panel    Standing Status: Future     Number of Occurrences:      Standing Expiration Date: 03/15/2016   All questions were answered. The patient knows to call the clinic with any problems, questions or concerns. No barriers to learning was detected. I spent 15  minutes counseling the patient face to face. The total time spent in the appointment was 20 minutes and more than 50% was on counseling and review of test results     [facility], [place], MD 02/09/2015 1:14 PM

## 2015-02-09 NOTE — Telephone Encounter (Signed)
REVIEWED-NO ADDITIONAL RECOMMENDATIONS. 

## 2015-02-09 NOTE — Assessment & Plan Note (Signed)
The PET CT scan showed no evidence of active disease. I discussed with him and family the importance of having repeat endoscopy and random biopsy to exclude minimum residual disease. I will refer him back to his gastroenterologist for further management. From the lymphoma standpoint, he had only stage I disease. I plan to follow with history, physical examination and blood work only in 3 months and to defer imaging study unless he has signs and symptoms suspicious for clinical recurrence

## 2015-02-09 NOTE — Telephone Encounter (Signed)
Spoke with Dr Nona Dell staff regarding message below

## 2015-02-09 NOTE — Telephone Encounter (Signed)
When over medications and instructions with him and he is aware to be at [facility] at 12:15 pm.

## 2015-02-09 NOTE — Telephone Encounter (Signed)
TRIAGE FOR EGD FRI NOV 18.

## 2015-02-09 NOTE — Addendum Note (Signed)
Addended by: Marlou Porch on: 02/09/2015 04:20 PM   Modules accepted: Medications

## 2015-02-09 NOTE — Telephone Encounter (Signed)
per pof to sch pt appt-gave pt copy of avs °

## 2015-02-10 ENCOUNTER — Ambulatory Visit (HOSPITAL_COMMUNITY)
Admission: RE | Admit: 2015-02-10 | Discharge: 2015-02-10 | Disposition: A | Discharge status: Home / Self Care | Payer: Medicare Other | Source: Ambulatory Visit | Attending: Gastroenterology | Admitting: Gastroenterology

## 2015-02-10 ENCOUNTER — Encounter (HOSPITAL_COMMUNITY)
Admission: RE | Disposition: A | Discharge status: Home / Self Care | Payer: Self-pay | Source: Ambulatory Visit | Attending: Gastroenterology

## 2015-02-10 ENCOUNTER — Encounter (HOSPITAL_COMMUNITY): Payer: Self-pay | Admitting: *Deleted

## 2015-02-10 ENCOUNTER — Other Ambulatory Visit: Payer: Self-pay | Admitting: Hematology and Oncology

## 2015-02-10 DIAGNOSIS — I2510 Atherosclerotic heart disease of native coronary artery without angina pectoris: Secondary | ICD-10-CM | POA: Insufficient documentation

## 2015-02-10 DIAGNOSIS — C8599 Non-Hodgkin lymphoma, unspecified, extranodal and solid organ sites: Secondary | ICD-10-CM

## 2015-02-10 DIAGNOSIS — C8593 Non-Hodgkin lymphoma, unspecified, intra-abdominal lymph nodes: Secondary | ICD-10-CM | POA: Diagnosis not present

## 2015-02-10 DIAGNOSIS — K2970 Gastritis, unspecified, without bleeding: Secondary | ICD-10-CM | POA: Insufficient documentation

## 2015-02-10 DIAGNOSIS — Z8572 Personal history of non-Hodgkin lymphomas: Secondary | ICD-10-CM | POA: Diagnosis not present

## 2015-02-10 DIAGNOSIS — Z7951 Long term (current) use of inhaled steroids: Secondary | ICD-10-CM | POA: Insufficient documentation

## 2015-02-10 DIAGNOSIS — Z95810 Presence of automatic (implantable) cardiac defibrillator: Secondary | ICD-10-CM | POA: Insufficient documentation

## 2015-02-10 DIAGNOSIS — N400 Enlarged prostate without lower urinary tract symptoms: Secondary | ICD-10-CM | POA: Insufficient documentation

## 2015-02-10 DIAGNOSIS — M1990 Unspecified osteoarthritis, unspecified site: Secondary | ICD-10-CM | POA: Insufficient documentation

## 2015-02-10 DIAGNOSIS — Z87891 Personal history of nicotine dependence: Secondary | ICD-10-CM | POA: Insufficient documentation

## 2015-02-10 DIAGNOSIS — Z08 Encounter for follow-up examination after completed treatment for malignant neoplasm: Secondary | ICD-10-CM | POA: Diagnosis not present

## 2015-02-10 DIAGNOSIS — E039 Hypothyroidism, unspecified: Secondary | ICD-10-CM | POA: Insufficient documentation

## 2015-02-10 DIAGNOSIS — J449 Chronic obstructive pulmonary disease, unspecified: Secondary | ICD-10-CM | POA: Insufficient documentation

## 2015-02-10 DIAGNOSIS — I10 Essential (primary) hypertension: Secondary | ICD-10-CM | POA: Insufficient documentation

## 2015-02-10 DIAGNOSIS — Z79899 Other long term (current) drug therapy: Secondary | ICD-10-CM | POA: Insufficient documentation

## 2015-02-10 DIAGNOSIS — Z8249 Family history of ischemic heart disease and other diseases of the circulatory system: Secondary | ICD-10-CM | POA: Insufficient documentation

## 2015-02-10 HISTORY — PX: ESOPHAGOGASTRODUODENOSCOPY: SHX5428

## 2015-02-10 SURGERY — EGD (ESOPHAGOGASTRODUODENOSCOPY)
Anesthesia: Moderate Sedation

## 2015-02-10 MED ORDER — LIDOCAINE VISCOUS 2 % MT SOLN
OROMUCOSAL | Status: DC | PRN
  Administered 2015-02-10: 1 via OROMUCOSAL

## 2015-02-10 MED ORDER — MIDAZOLAM HCL 5 MG/5ML IJ SOLN
INTRAMUSCULAR | Status: DC | PRN
Start: 2015-02-10 — End: 2015-02-10
  Administered 2015-02-10: 2 mg via INTRAVENOUS

## 2015-02-10 MED ORDER — FENTANYL CITRATE (PF) 100 MCG/2ML IJ SOLN
INTRAMUSCULAR | Status: DC | PRN
  Administered 2015-02-10 (×2): 25 ug via INTRAVENOUS

## 2015-02-10 MED ORDER — LIDOCAINE VISCOUS 2 % MT SOLN
OROMUCOSAL | Status: AC
  Filled 2015-02-10: qty 15

## 2015-02-10 MED ORDER — MIDAZOLAM HCL 5 MG/5ML IJ SOLN
INTRAMUSCULAR | Status: AC
  Filled 2015-02-10: qty 10

## 2015-02-10 MED ORDER — MEPERIDINE HCL 100 MG/ML IJ SOLN
INTRAMUSCULAR | Status: AC
  Filled 2015-02-10: qty 2

## 2015-02-10 MED ORDER — FENTANYL CITRATE (PF) 100 MCG/2ML IJ SOLN
INTRAMUSCULAR | Status: AC
  Filled 2015-02-10: qty 2

## 2015-02-10 MED ORDER — SODIUM CHLORIDE 0.9 % IV SOLN
INTRAVENOUS | Status: DC
  Administered 2015-02-10: 1000 mL via INTRAVENOUS

## 2015-02-10 NOTE — Discharge Instructions (Signed)
THE LESIONS FROM YOUR GASTRIC LYMPHOMA AND YOUR ULCERS ARE COMPLETELY RESOLVED. YOU ONLY HAVE MILD GASTRITIS. I SPOKE WITH DR. Alvy Bimler AND SHE IS AWARE.  FOLLOW UP IN 6 MOS with DR. FIELDS. HAPPY THANKSGIVING, MERRY CHRISTMAS AND HAPPY NEW YEAR!   Esophagogastroduodenoscopy, Care After Refer to this sheet in the next few weeks. These instructions provide you with information about caring for yourself after your procedure. Your health care provider may also give you more specific instructions. Your treatment has been planned according to current medical practices, but problems sometimes occur. Call your health care provider if you have any problems or questions after your procedure. WHAT TO EXPECT AFTER THE PROCEDURE After your procedure, it is typical to feel:  Soreness in your throat.  Pain with swallowing.  Sick to your stomach (nauseous).  Bloated.  Dizzy.  Fatigued. HOME CARE INSTRUCTIONS  Do not eat or drink anything until the numbing medicine (local anesthetic) has worn off and your gag reflex has returned. You will know that the local anesthetic has worn off when you can swallow comfortably.  Do not drive or operate machinery until directed by your health care provider.  Take medicines only as directed by your health care provider. SEEK MEDICAL CARE IF:   You cannot stop coughing.  You are not urinating at all or less than usual. SEEK IMMEDIATE MEDICAL CARE IF:  You have difficulty swallowing.  You cannot eat or drink.  You have worsening throat or chest pain.  You have dizziness or lightheadedness or you faint.  You have nausea or vomiting.  You have chills.  You have a fever.  You have severe abdominal pain.  You have black, tarry, or bloody stools.   This information is not intended to replace advice given to you by your health care provider. Make sure you discuss any questions you have with your health care provider.

## 2015-02-10 NOTE — H&P (Signed)
Primary Care Physician:  Walker Kehr, MD Primary Gastroenterologist:  Dr. Oneida Alar  Pre-Procedure History & Physical: HPI:  [name] [name] is a 81 y.o. 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 Hypertension   . Hypothyroid   . CAD (coronary artery disease)     Dr. Lovena Le  . Osteoarthritis   . BPH (benign prostatic hypertrophy)     Dr. Terance Hart  . Osteoporosis     (took actonel x 5y)  . Renal insufficiency     Dr. Armstead Peaks  . Hip fx ([place])     x 2 on R.  . Sternum fx 2011  . Urinary incontinence   . Hearing loss   . Seizures ([place])   . Cancer ([place])     B cell lymphoma stomach  . 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 Presence of permanent cardiac pacemaker     and defibrilator  . COPD (chronic obstructive pulmonary disease) [facility])     Past Surgical History  Procedure Laterality Date  . Heart disease      permanent pacemaker  . Appendectomy    . Inguinal hernia repair    . Hip fracture surgery  2011    ORIF  R.  . Pacemaker insertion    . Colonoscopy  12/1998, 02/2004    diverticulosois, external hemorrhoids  . Esophagogastroduodenoscopy  06/17/2014    Dr. Oneida Alar: 1. Patent stricture at the gastroesophageal junction 2. UGIB Due to multiple  gastric ulcers 3. Moderate Duodentitis. Negative H.pylori  . Esophagogastroduodenoscopy N/A 10/17/2014    Procedure: ESOPHAGOGASTRODUODENOSCOPY (EGD);  Surgeon: Danie Binder, MD;  [facility];  Service: Endoscopy;  Laterality: N/A;  1030    Prior to Admission medications   Medication Sig Start Date End Date Taking? Authorizing Provider  atorvastatin (LIPITOR) 20 MG tablet TAKE 1/2 TABLET TWICE DAILY 11/10/14  Yes Aleksei Plotnikov V, MD  Cholecalciferol (VITAMIN D3) 1000 UNITS tablet Take 1,000 Units by mouth daily.     Yes Historical Provider, MD  diltiazem 2 % GEL Apply 1 application topically 3 (three) times daily. 12/28/14  Yes Aleksei  Plotnikov V, MD  donepezil (ARICEPT) 10 MG tablet TAKE 1 TABLET AT BEDTIME 05/10/14  Yes Aleksei Plotnikov V, MD  fluticasone (FLONASE) 50 MCG/ACT nasal spray Place 2 sprays into both nostrils daily. 05/26/14  Yes Aleksei Plotnikov V, MD  metoprolol succinate (TOPROL-XL) 25 MG 24 hr tablet Take 0.5 tablets (12.5 mg total) by mouth 2 (two) times daily. 12/28/14  Yes Aleksei Plotnikov V, MD  albuterol (PROVENTIL) (2.5 MG/3ML) 0.083% nebulizer solution Take 3 mLs (2.5 mg total) by nebulization every 6 (six) hours as needed for wheezing or shortness of breath. 02/09/15   Heath Lark, MD  Ascorbic Acid (VITAMIN C PO) Take 1 tablet by mouth daily.    Historical Provider, MD  folic acid (FOLVITE) Q000111Q MCG tablet Take 800 mcg by mouth daily.     Historical Provider, MD  levothyroxine (SYNTHROID, LEVOTHROID) 100 MCG tablet Take 1 tablet (100 mcg total) by mouth daily. 09/27/14   Aleksei Plotnikov V, MD  nitroGLYCERIN (NITROSTAT) 0.4 MG SL tablet Place 1 tablet (0.4 mg total) under the tongue every 5 (five) minutes as needed. Chest pain 07/26/14 07/24/16  Lew Dawes V, MD  pantoprazole (PROTONIX) 40 MG tablet Take 1 tablet (40 mg total) by mouth daily. Patient taking differently: Take 40 mg by mouth 2 (two) times daily.  09/20/14   Orvil Feil, NP  triamcinolone (NASACORT) 55 MCG/ACT nasal inhaler Place 1 spray into the nose daily. 05/01/12   Aleksei Plotnikov V, MD    Allergies as of 02/09/2015 - Review Complete 02/09/2015  Allergen Reaction Noted  . Aleve [naproxen sodium] Other (See Comments) 10/26/2014  . Ibuprofen Other (See Comments) 10/26/2014    Family History  Problem Relation Age of Onset  . Coronary artery disease      male 1st degree relative<60  . Hypertension    . Cancer Sister     Social History   Social History  . Marital Status: Widowed    Spouse Name: N/A  . Number of Children: 2  . Years of Education: N/A   Occupational History  . retired    Social History Main Topics  .  Smoking status: Former Smoker -- 1.00 packs/day for 30 years    Quit date: 10/26/1976  . Smokeless tobacco: Never Used  . Alcohol Use: No  . Drug Use: No  . Sexual Activity: Not Currently   Other Topics Concern  . Not on file   Social History Narrative   Regular exercise--no.    Review of Systems: See HPI, otherwise negative ROS   Physical Exam: BP 162/76 mmHg  Pulse 65  Temp(Src) 97.4 F (36.3 C) (Oral)  Resp 16  Ht 5\' 4"  (1.626 m)  Wt 152 lb (68.947 kg)  BMI 26.08 kg/m2  SpO2 100% General:   Alert,  pleasant and cooperative in NAD Head:  Normocephalic and atraumatic. Neck:  Supple; Lungs:  Clear throughout to auscultation.    Heart:  Regular rate and rhythm. Abdomen:  Soft, nontender and nondistended. Normal bowel sounds, without guarding, and without rebound.   Neurologic:  Alert and  oriented x4;  grossly normal neurologically.  Impression/Plan:    Gastric lymphoma on EGD JUL 2016 Plan: 1.EGD today to re-assess gastric lesions

## 2015-02-10 NOTE — Op Note (Signed)
[place], [id number]   ENDOSCOPY PROCEDURE REPORT  PATIENT: [name] [name], [name] [name]  MR#: [id number] BIRTHDATE: 10-13-1922 , 25  yrs. old GENDER: male  ENDOSCOPIST: Danie Binder, MD REFERRED [id number] Avel Sensor, M.D. , DR. NI [facility]  PROCEDURE DATE: March 12, 2015 PROCEDURE:   EGD, diagnostic  INDICATIONS:GASTRIC DIFFUSE B CELL LYMPHOMA DIAGNOSED JUL 2016.  RX: RITUXIMAB x 4.  EGD TO ASSESS GASTRIC LESIONS. MEDICATIONS: Fentanyl 50 mcg IV and Versed 2 mg IV TOPICAL ANESTHETIC:   Viscous Xylocaine ASA CLASS:  DESCRIPTION OF PROCEDURE:     Physical exam was performed.  Informed consent was obtained from the patient after explaining the benefits, risks, and alternatives to the procedure.  The patient was connected to the monitor and placed in the left lateral position.  Continuous oxygen was provided by nasal cannula and IV medicine administered through an indwelling cannula.  After administration of sedation, the patients esophagus was intubated and the [id number]  endoscope was advanced under direct visualization to the second portion of the duodenum.  The scope was removed slowly by carefully examining the color, texture, anatomy, and integrity of the mucosa on the way out.  The patient was recovered in endoscopy and discharged home in satisfactory condition.  Estimated blood loss is zero unless otherwise noted in this procedure report.    ESOPHAGUS: The mucosa of the esophagus appeared normal.   STOMACH: Mild non-erosive gastritis (inflammation) was found in the gastric body and gastric antrum.   DUODENUM: The duodenal mucosa showed no abnormalities in the bulb and 2nd part of the duodenum.  COMPLICATIONS: There were no immediate complications.  ENDOSCOPIC IMPRESSION: 1.   GASTRIC LYMPHOMA RESOLVED 2.   MILD Non-erosive gastritis  RECOMMENDATIONS: OPV IN 6 MOS W/ DR.  Emaley Applin  REPEAT  EXAM:   _______________________________ eSignedDanie Binder, MD 12-Mar-2015 2:32 PM   CPT CODES: ICD CODES:  The ICD and CPT codes recommended by this software are interpretations from the data that the clinical staff has captured with the software.  The verification of the translation of this report to the ICD and CPT codes and modifiers is the sole responsibility of the health care institution and practicing physician where this report was generated.  [place]. will not be held responsible for the validity of the ICD and CPT codes included on this report.  AMA assumes no liability for data contained or not contained herein. CPT is a Designer, television/film set of the [employer].

## 2015-02-20 ENCOUNTER — Encounter (HOSPITAL_COMMUNITY): Payer: Self-pay | Admitting: Gastroenterology

## 2015-03-02 ENCOUNTER — Encounter: Payer: Self-pay | Admitting: *Deleted

## 2015-03-11 ENCOUNTER — Emergency Department (HOSPITAL_COMMUNITY): Payer: Medicare Other

## 2015-03-11 ENCOUNTER — Inpatient Hospital Stay (HOSPITAL_COMMUNITY)
Admission: EM | Admit: 2015-03-11 | Discharge: 2015-03-16 | DRG: 535 | Disposition: A | Discharge status: Skilled Nursing Facility | Payer: Medicare Other | Attending: Internal Medicine | Admitting: Internal Medicine

## 2015-03-11 ENCOUNTER — Encounter (HOSPITAL_COMMUNITY): Payer: Self-pay | Admitting: *Deleted

## 2015-03-11 DIAGNOSIS — M9701XA Periprosthetic fracture around internal prosthetic right hip joint, initial encounter: Secondary | ICD-10-CM | POA: Diagnosis not present

## 2015-03-11 DIAGNOSIS — T40695A Adverse effect of other narcotics, initial encounter: Secondary | ICD-10-CM | POA: Diagnosis present

## 2015-03-11 DIAGNOSIS — R05 Cough: Secondary | ICD-10-CM | POA: Diagnosis not present

## 2015-03-11 DIAGNOSIS — I5042 Chronic combined systolic (congestive) and diastolic (congestive) heart failure: Secondary | ICD-10-CM | POA: Diagnosis not present

## 2015-03-11 DIAGNOSIS — Z8572 Personal history of non-Hodgkin lymphomas: Secondary | ICD-10-CM | POA: Diagnosis present

## 2015-03-11 DIAGNOSIS — R413 Other amnesia: Secondary | ICD-10-CM | POA: Diagnosis present

## 2015-03-11 DIAGNOSIS — C8333 Diffuse large B-cell lymphoma, intra-abdominal lymph nodes: Secondary | ICD-10-CM | POA: Diagnosis not present

## 2015-03-11 DIAGNOSIS — M810 Age-related osteoporosis without current pathological fracture: Secondary | ICD-10-CM | POA: Diagnosis present

## 2015-03-11 DIAGNOSIS — R52 Pain, unspecified: Secondary | ICD-10-CM

## 2015-03-11 DIAGNOSIS — R609 Edema, unspecified: Secondary | ICD-10-CM

## 2015-03-11 DIAGNOSIS — H9190 Unspecified hearing loss, unspecified ear: Secondary | ICD-10-CM | POA: Diagnosis present

## 2015-03-11 DIAGNOSIS — S7291XA Unspecified fracture of right femur, initial encounter for closed fracture: Secondary | ICD-10-CM | POA: Diagnosis present

## 2015-03-11 DIAGNOSIS — I2510 Atherosclerotic heart disease of native coronary artery without angina pectoris: Secondary | ICD-10-CM | POA: Diagnosis present

## 2015-03-11 DIAGNOSIS — J9601 Acute respiratory failure with hypoxia: Secondary | ICD-10-CM | POA: Diagnosis present

## 2015-03-11 DIAGNOSIS — W19XXXA Unspecified fall, initial encounter: Secondary | ICD-10-CM

## 2015-03-11 DIAGNOSIS — Z79899 Other long term (current) drug therapy: Secondary | ICD-10-CM

## 2015-03-11 DIAGNOSIS — S199XXA Unspecified injury of neck, initial encounter: Secondary | ICD-10-CM | POA: Diagnosis not present

## 2015-03-11 DIAGNOSIS — D72829 Elevated white blood cell count, unspecified: Secondary | ICD-10-CM | POA: Diagnosis present

## 2015-03-11 DIAGNOSIS — L8990 Pressure ulcer of unspecified site, unspecified stage: Secondary | ICD-10-CM | POA: Insufficient documentation

## 2015-03-11 DIAGNOSIS — D696 Thrombocytopenia, unspecified: Secondary | ICD-10-CM | POA: Diagnosis present

## 2015-03-11 DIAGNOSIS — R51 Headache: Secondary | ICD-10-CM | POA: Diagnosis not present

## 2015-03-11 DIAGNOSIS — N183 Chronic kidney disease, stage 3 unspecified: Secondary | ICD-10-CM | POA: Diagnosis present

## 2015-03-11 DIAGNOSIS — S59901A Unspecified injury of right elbow, initial encounter: Secondary | ICD-10-CM | POA: Diagnosis not present

## 2015-03-11 DIAGNOSIS — S0093XA Contusion of unspecified part of head, initial encounter: Secondary | ICD-10-CM | POA: Diagnosis present

## 2015-03-11 DIAGNOSIS — I5022 Chronic systolic (congestive) heart failure: Secondary | ICD-10-CM | POA: Diagnosis not present

## 2015-03-11 DIAGNOSIS — Z87891 Personal history of nicotine dependence: Secondary | ICD-10-CM

## 2015-03-11 DIAGNOSIS — W010XXA Fall on same level from slipping, tripping and stumbling without subsequent striking against object, initial encounter: Secondary | ICD-10-CM | POA: Diagnosis present

## 2015-03-11 DIAGNOSIS — R0602 Shortness of breath: Secondary | ICD-10-CM | POA: Diagnosis not present

## 2015-03-11 DIAGNOSIS — J449 Chronic obstructive pulmonary disease, unspecified: Secondary | ICD-10-CM | POA: Diagnosis present

## 2015-03-11 DIAGNOSIS — E785 Hyperlipidemia, unspecified: Secondary | ICD-10-CM | POA: Diagnosis present

## 2015-03-11 DIAGNOSIS — M1990 Unspecified osteoarthritis, unspecified site: Secondary | ICD-10-CM | POA: Diagnosis present

## 2015-03-11 DIAGNOSIS — Z8249 Family history of ischemic heart disease and other diseases of the circulatory system: Secondary | ICD-10-CM

## 2015-03-11 DIAGNOSIS — S72001A Fracture of unspecified part of neck of right femur, initial encounter for closed fracture: Principal | ICD-10-CM | POA: Diagnosis present

## 2015-03-11 DIAGNOSIS — T148 Other injury of unspecified body region: Secondary | ICD-10-CM | POA: Diagnosis not present

## 2015-03-11 DIAGNOSIS — Z95810 Presence of automatic (implantable) cardiac defibrillator: Secondary | ICD-10-CM | POA: Diagnosis present

## 2015-03-11 DIAGNOSIS — J9611 Chronic respiratory failure with hypoxia: Secondary | ICD-10-CM | POA: Diagnosis present

## 2015-03-11 DIAGNOSIS — D631 Anemia in chronic kidney disease: Secondary | ICD-10-CM | POA: Diagnosis present

## 2015-03-11 DIAGNOSIS — S72111A Displaced fracture of greater trochanter of right femur, initial encounter for closed fracture: Secondary | ICD-10-CM | POA: Diagnosis not present

## 2015-03-11 DIAGNOSIS — N189 Chronic kidney disease, unspecified: Secondary | ICD-10-CM

## 2015-03-11 DIAGNOSIS — I130 Hypertensive heart and chronic kidney disease with heart failure and stage 1 through stage 4 chronic kidney disease, or unspecified chronic kidney disease: Secondary | ICD-10-CM | POA: Diagnosis present

## 2015-03-11 DIAGNOSIS — Z96641 Presence of right artificial hip joint: Secondary | ICD-10-CM | POA: Diagnosis present

## 2015-03-11 DIAGNOSIS — I10 Essential (primary) hypertension: Secondary | ICD-10-CM | POA: Diagnosis present

## 2015-03-11 DIAGNOSIS — Z809 Family history of malignant neoplasm, unspecified: Secondary | ICD-10-CM

## 2015-03-11 DIAGNOSIS — M25551 Pain in right hip: Secondary | ICD-10-CM | POA: Diagnosis not present

## 2015-03-11 DIAGNOSIS — C8330 Diffuse large B-cell lymphoma, unspecified site: Secondary | ICD-10-CM | POA: Diagnosis not present

## 2015-03-11 DIAGNOSIS — S0003XA Contusion of scalp, initial encounter: Secondary | ICD-10-CM | POA: Diagnosis not present

## 2015-03-11 DIAGNOSIS — E039 Hypothyroidism, unspecified: Secondary | ICD-10-CM | POA: Diagnosis present

## 2015-03-11 DIAGNOSIS — M25561 Pain in right knee: Secondary | ICD-10-CM

## 2015-03-11 LAB — BASIC METABOLIC PANEL
ANION GAP: 9 (ref 5–15)
BUN: 36 mg/dL — ABNORMAL HIGH (ref 6–20)
CALCIUM: 8.8 mg/dL — AB (ref 8.9–10.3)
CO2: 25 mmol/L (ref 22–32)
CREATININE: 1.67 mg/dL — AB (ref 0.61–1.24)
Chloride: 102 mmol/L (ref 101–111)
GFR, EST AFRICAN AMERICAN: 40 mL/min — AB (ref >60)
GFR, EST NON AFRICAN AMERICAN: 34 mL/min — AB (ref >60)
Glucose, Bld: 109 mg/dL — ABNORMAL HIGH (ref 65–99)
Potassium: 4.8 mmol/L (ref 3.5–5.1)
Sodium: 136 mmol/L (ref 135–145)

## 2015-03-11 LAB — CBC WITH DIFFERENTIAL/PLATELET
BASOS ABS: 0 10*3/uL (ref 0.0–0.1)
BASOS PCT: 0 %
EOS ABS: 0.4 10*3/uL (ref 0.0–0.7)
Eosinophils Relative: 2 %
HEMATOCRIT: 37.5 % — AB (ref 39.0–52.0)
Hemoglobin: 12.8 g/dL — ABNORMAL LOW (ref 13.0–17.0)
Lymphocytes Relative: 8 %
Lymphs Abs: 1.3 10*3/uL (ref 0.7–4.0)
MCH: 31.3 pg (ref 26.0–34.0)
MCHC: 34.1 g/dL (ref 30.0–36.0)
MCV: 91.7 fL (ref 78.0–100.0)
MONO ABS: 2 10*3/uL — AB (ref 0.1–1.0)
MONOS PCT: 13 %
NEUTROS ABS: 11.3 10*3/uL — AB (ref 1.7–7.7)
NEUTROS PCT: 77 %
Platelets: 141 10*3/uL — ABNORMAL LOW (ref 150–400)
RBC: 4.09 MIL/uL — ABNORMAL LOW (ref 4.22–5.81)
RDW: 14 % (ref 11.5–15.5)
WBC: 15 10*3/uL — ABNORMAL HIGH (ref 4.0–10.5)

## 2015-03-11 MED ORDER — SODIUM CHLORIDE 0.9 % IV SOLN
INTRAVENOUS | Status: DC
  Administered 2015-03-11: 1000 mL via INTRAVENOUS
  Administered 2015-03-14: 19:00:00 via INTRAVENOUS

## 2015-03-11 MED ORDER — FENTANYL CITRATE (PF) 100 MCG/2ML IJ SOLN
50.0000 ug | Freq: Once | INTRAMUSCULAR | Status: AC
  Administered 2015-03-11: 50 ug via INTRAVENOUS
  Filled 2015-03-11: qty 2

## 2015-03-11 MED ORDER — ONDANSETRON HCL 4 MG/2ML IJ SOLN
4.0000 mg | Freq: Once | INTRAMUSCULAR | Status: AC
  Administered 2015-03-11: 4 mg via INTRAVENOUS
  Filled 2015-03-11: qty 2

## 2015-03-11 NOTE — ED Notes (Signed)
Patient transported to X-ray 

## 2015-03-11 NOTE — ED Notes (Addendum)
Per [facility], pt was in the attic, tripped coming down the steps.  C/o right hip pain, hx of right hip fx x 2, replaced x 1.  Pt presents with c-collar intact..  Pt denies LOC or hitting head.  Pt is A&O x 3.  Pt received morphine 6 mg by [facility].

## 2015-03-11 NOTE — ED Notes (Signed)
Bed: WA07 Expected date:  Expected time:  Means of arrival:  Comments: 81 yr old fall, hip pain

## 2015-03-11 NOTE — ED Provider Notes (Signed)
CSN: [id number]     Arrival date & time 03/11/15  1943 History   First MD Initiated Contact with Patient 03/11/15 2005     Chief Complaint  Patient presents with  . Fall  . Hip Pain    right     (Consider location/radiation/quality/duration/timing/severity/associated sxs/prior Treatment) HPI  [name] [name] is a 81 y.o. male who was carrying some decorations down from the attic, when he tripped, fell and struck his right hip on the floor. He is unable to get up secondary to pain in his right hip. There were no preceding symptoms. He denies recent fever, chills, nausea, vomiting, weakness or dizziness. He's taking his usual medications. Last full meal was at about 1 PM today. Has had some liquids to drink since then. There are no other known modifying factors.    Past Medical History  Diagnosis Date  . Hypertension   . Hypothyroid   . CAD (coronary artery disease)     Dr. Lovena Le  . Osteoarthritis   . BPH (benign prostatic hypertrophy)     Dr. Terance Hart  . Osteoporosis     (took actonel x 5y)  . Renal insufficiency     Dr. Armstead Peaks  . Hip fx ([place])     x 2 on R.  . Sternum fx 2011  . Urinary incontinence   . Hearing loss   . Seizures ([place])   . Cancer ([place])     B cell lymphoma stomach  . 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 Presence of permanent cardiac pacemaker     and defibrilator  . COPD (chronic obstructive pulmonary disease) [facility])    Past Surgical History  Procedure Laterality Date  . Heart disease      permanent pacemaker  . Appendectomy    . Inguinal hernia repair    . Hip fracture surgery  2011    ORIF  R.  . Pacemaker insertion    . Colonoscopy  12/1998, 02/2004    diverticulosois, external hemorrhoids  . Esophagogastroduodenoscopy  06/17/2014    Dr. Oneida Alar: 1. Patent stricture at the gastroesophageal junction 2. UGIB Due to multiple  gastric ulcers 3. Moderate Duodentitis. Negative H.pylori  . Esophagogastroduodenoscopy N/A 10/17/2014     Procedure: ESOPHAGOGASTRODUODENOSCOPY (EGD);  Surgeon: Danie Binder, MD;  [facility];  Service: Endoscopy;  Laterality: N/A;  1030  . Esophagogastroduodenoscopy N/A 02/10/2015    Procedure: ESOPHAGOGASTRODUODENOSCOPY (EGD);  Surgeon: Danie Binder, MD;  [facility];  Service: Endoscopy;  Laterality: N/A;  115   Family History  Problem Relation Age of Onset  . Coronary artery disease      male 1st degree relative<60  . Hypertension    . Cancer Sister    Social History  Substance Use Topics  . Smoking status: Former Smoker -- 1.00 packs/day for 30 years    Quit date: 10/26/1976  . Smokeless tobacco: Never Used  . Alcohol Use: No    Review of Systems  All other systems reviewed and are negative.     Allergies  Aleve and Ibuprofen  Home Medications   Prior to Admission medications   Medication Sig Start Date End Date Taking? Authorizing Provider  acetaminophen (TYLENOL) 500 MG tablet Take 500 mg by mouth every 6 (six) hours as needed for headache.   Yes Historical Provider, MD  albuterol (PROVENTIL) (2.5 MG/3ML) 0.083% nebulizer solution Take 3 mLs (2.5 mg total) by nebulization every 6 (six) hours as needed for wheezing  or shortness of breath. 02/09/15  Yes Heath Lark, MD  Apoaequorin (PREVAGEN PO) Take 1 capsule by mouth daily.   Yes Historical Provider, MD  Artificial Tear Ointment (DRY EYES OP) Apply 1 drop to eye daily.   Yes Historical Provider, MD  Ascorbic Acid (VITAMIN C PO) Take 1 tablet by mouth daily.   Yes Historical Provider, MD  atorvastatin (LIPITOR) 20 MG tablet TAKE 1/2 TABLET TWICE DAILY 11/10/14  Yes Aleksei Plotnikov V, MD  Cholecalciferol (VITAMIN D3) 1000 UNITS tablet Take 1,000 Units by mouth daily.     Yes Historical Provider, MD  diltiazem 2 % GEL Apply 1 application topically 3 (three) times daily. 12/28/14  Yes Aleksei Plotnikov V, MD  donepezil (ARICEPT) 10 MG tablet TAKE 1 TABLET AT BEDTIME 05/10/14  Yes Aleksei Plotnikov V,  MD  fluticasone (FLONASE) 50 MCG/ACT nasal spray Place 2 sprays into both nostrils daily. 05/26/14  Yes Aleksei Plotnikov V, MD  folic acid (FOLVITE) Q000111Q MCG tablet Take 400 mcg by mouth daily.    Yes Historical Provider, MD  levothyroxine (SYNTHROID, LEVOTHROID) 100 MCG tablet Take 1 tablet (100 mcg total) by mouth daily. 09/27/14  Yes Aleksei Plotnikov V, MD  metoprolol succinate (TOPROL-XL) 25 MG 24 hr tablet Take 0.5 tablets (12.5 mg total) by mouth 2 (two) times daily. 12/28/14  Yes Aleksei Plotnikov V, MD  nitroGLYCERIN (NITROSTAT) 0.4 MG SL tablet Place 1 tablet (0.4 mg total) under the tongue every 5 (five) minutes as needed. Chest pain 07/26/14 07/24/16 Yes Aleksei Plotnikov V, MD  Phenyleph-Doxylamine-DM-APAP (VICKS DAYQUIL/NYQUIL CLD & FLU PO) Take 2 capsules by mouth daily as needed (cold symtpoms).   Yes Historical Provider, MD  triamcinolone (NASACORT) 55 MCG/ACT nasal inhaler Place 1 spray into the nose daily. 05/01/12  Yes Aleksei Plotnikov V, MD  VITAMIN E PO Take 1 capsule by mouth daily.   Yes Historical Provider, MD  pantoprazole (PROTONIX) 40 MG tablet Take 1 tablet (40 mg total) by mouth daily. Patient taking differently: Take 40 mg by mouth 2 (two) times daily.  09/20/14   Orvil Feil, NP   BP 148/78 mmHg  Pulse 66  Temp(Src) 97.4 F (36.3 C) (Oral)  Resp 18  Ht 5\' 4"  (1.626 m)  Wt 152 lb (68.947 kg)  BMI 26.08 kg/m2  SpO2 92% Physical Exam  Constitutional: He is oriented to person, place, and time. He appears well-developed and well-nourished.  HENT:  Head: Normocephalic.  Right Ear: External ear normal.  Left Ear: External ear normal.  Small contusion right temporal scalp. No associated abrasion or laceration. Neck is supple and nontender to palpation.  Eyes: Conjunctivae and EOM are normal. Pupils are equal, round, and reactive to light.  Neck: Normal range of motion and phonation normal. Neck supple.  Cardiovascular: Normal rate, regular rhythm and normal heart sounds.    Pulmonary/Chest: Effort normal and breath sounds normal. He exhibits no bony tenderness.  Abdominal: Soft. There is no tenderness.  Musculoskeletal: Normal range of motion.  Mild right hip pain to palpation and with passive range of motion. Pelvis is stable with rocking motion.  Neurological: He is alert and oriented to person, place, and time. No cranial nerve deficit or sensory deficit. He exhibits normal muscle tone. Coordination normal.  Skin: Skin is warm, dry and intact.  Psychiatric: He has a normal mood and affect. His behavior is normal. Judgment and thought content normal.  Nursing note and vitals reviewed.   ED Course  Procedures (including critical care time)  Medications  0.9 %  sodium chloride infusion (1,000 mLs Intravenous New Bag/Given 03/11/15 2058)    Patient Vitals for the past 24 hrs:  BP Temp Temp src Pulse Resp SpO2 Height Weight  03/11/15 1954 148/78 mmHg 97.4 F (36.3 C) Oral 66 18 92 % 5\' 4"  (1.626 m) 152 lb (68.947 kg)  03/11/15 1946 - - - - - 94 % - -    9:28 PM Reevaluation with update and discussion. After initial assessment and treatment, an updated evaluation reveals he remains comfortable. He has not requested pain medication.Daleen Bo L   21:55- case discussed with orthopedics, Dr. Rushie Nyhan. He recommends trial of ambulation with walker, and discharged home if able to walk.  23:00- ambulation trial, after treatment with narcotic medication. Patient was helped to a vertical position and could not be any weight on his right foot, secondary to pain in the right hip. Oxygen saturation, has been somewhat low to 86%, post treatment with narcotics. Findings were discussed with the family who are requesting admission, since he "lives alone".   11:02 PM-Consult complete with Hospitalist. Patient case explained and discussed. She agrees to admit patient for further evaluation and treatment. Call ended at 23:17  Labs Review Labs Reviewed  CBC WITH  DIFFERENTIAL/PLATELET - Abnormal; Notable for the following:    WBC 15.0 (*)    RBC 4.09 (*)    Hemoglobin 12.8 (*)    HCT 37.5 (*)    Platelets 141 (*)    Neutro Abs 11.3 (*)    Monocytes Absolute 2.0 (*)    All other components within normal limits  BASIC METABOLIC PANEL    Imaging Review Dg Hip Unilat With Pelvis 2-3 Views Right  03/11/2015  CLINICAL DATA:  Right hip pain.  Fall. EXAM: DG HIP (WITH OR WITHOUT PELVIS) 2-3V RIGHT COMPARISON:  None. FINDINGS: Previous right hip arthroplasty. There is an acute periprosthetic fracture involving the proximal shaft of the right femur just below the greater trochanter. No dislocation identified. IMPRESSION: 1. Acute periprosthetic fracture involving the proximal right femur inches below the greater trochanter. Electronically Signed   By: Kerby Moors M.D.   On: 03/11/2015 20:52   I have personally reviewed and evaluated these images and lab results as part of my medical decision-making.   EKG Interpretation None      MDM   Final diagnoses:  Hip fracture, right, closed, initial encounter ([place])  Contusion of head, initial encounter    Apparent mechanical fall carrying items from attic. Right hip fracture, periprosthetic, which is nonoperative at this time. The orthopedic physician suggested that the patient be discharged home but he is unable to bear weight on his right foot. Therefore, he is unable to go home, where he lives alone. Incidental hypoxia post narcotic treatment. Contusion head, without serious head injury.  Nursing Notes Reviewed/ Care Coordinated, and agree without changes. Applicable Imaging Reviewed.  Interpretation of Laboratory Data incorporated into ED treatment  Plan: Admit    Daleen Bo, MD 03/11/15 2317

## 2015-03-11 NOTE — ED Notes (Signed)
Patient transported to CT 

## 2015-03-11 NOTE — ED Notes (Signed)
Attempted to ambulate with walker & person assist (RN & [employer] Evert).  Pt unable d/t pain.

## 2015-03-11 NOTE — H&P (Signed)
PCP:  Walker Kehr, MD  Hematology Al[facility] Bim[facility]  Referring provider Eulis Foster   Chief Complaint: Fall right hip pain  HPI: [name] [name] is a 81 y.o. 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   Presented with a mechanical fall today patient lives alone he was out in the attic and tripped coming down the steps was having significant right hip pain. Patient reports hitting his head. Emergency department CT scan of the head showed no acute abnormality hip films showing right periprosthetic proximal femur fracture cases been discussed with orthopedics who recommended outpatient follow-up unfortunately patient could not bear weight secondary to severe pain. While in emergency patient received 6 mg of morphine and became hypoxic. Currently patient recovering and doing better hypoxia has resolved. He is unable to bear weight secondary to pain. Denies any nausea or vomiting no fevers no weakness no dizziness. No syncope no chest pain  Hospitalist was called for admission for right proximal femoral fracture pain management  Review of Systems:    Pertinent positives include: Right hip pain  Constitutional:  No weight loss, night sweats, Fevers, chills, fatigue, weight loss  HEENT:  No headaches, Difficulty swallowing,Tooth/dental problems,Sore throat,  No sneezing, itching, ear ache, nasal congestion, post nasal drip,  Cardio-vascular:  No chest pain, Orthopnea, PND, anasarca, dizziness, palpitations.no Bilateral lower extremity swelling  GI:  No heartburn, indigestion, abdominal pain, nausea, vomiting, diarrhea, change  in bowel habits, loss of appetite, melena, blood in stool, hematemesis Resp:  no shortness of breath at rest. No dyspnea on exertion, No excess mucus, no productive cough, No non-productive cough, No coughing up of blood.No change in color of mucus.No wheezing. Skin:  no rash or lesions. No jaundice GU:  no dysuria, change in color of urine, no urgency or frequency. No straining to urinate.  No flank pain.  Musculoskeletal:  No joint pain or no joint swelling. No decreased range of motion. No back pain.  Psych:  No change in mood or affect. No depression or anxiety. No memory loss.  Neuro: no localizing neurological complaints, no tingling, no weakness, no double vision, no gait abnormality, no slurred speech, no confusion  Otherwise ROS are negative except for above, 10 systems were reviewed  Past Medical History: Past Medical History  Diagnosis Date  . Hypertension   . Hypothyroid   . CAD (coronary artery disease)     Dr. Lovena Le  . Osteoarthritis   . BPH (benign prostatic hypertrophy)     Dr. Terance Hart  . Osteoporosis     (took actonel x 5y)  . Renal insufficiency     Dr. Armstead Peaks  . Hip fx ([place])     x 2 on R.  . Sternum fx 2011  . Urinary incontinence   . Hearing loss   . Seizures ([place])   . Cancer ([place])     B cell lymphoma stomach  . Diffuse large B cell lymphoma ([place]) 10/27/2014  . Presence of permanent cardiac pacemaker     and defibrilator  . COPD (chronic obstructive pulmonary disease) [facility])    Past Surgical History  Procedure Laterality Date  . Heart disease      permanent pacemaker  . Appendectomy    .  Inguinal hernia repair    . Hip fracture surgery  2011    ORIF  R.  . Pacemaker insertion    . Colonoscopy  12/1998, 02/2004    diverticulosois, external hemorrhoids  . Esophagogastroduodenoscopy  06/17/2014    Dr. Oneida Alar: 1. Patent stricture at the gastroesophageal junction 2. UGIB Due to multiple  gastric ulcers 3. Moderate Duodentitis. Negative H.pylori   . Esophagogastroduodenoscopy N/A 10/17/2014    Procedure: ESOPHAGOGASTRODUODENOSCOPY (EGD);  Surgeon: Danie Binder, MD;  [facility];  Service: Endoscopy;  Laterality: N/A;  1030  . Esophagogastroduodenoscopy N/A 02/10/2015    Procedure: ESOPHAGOGASTRODUODENOSCOPY (EGD);  Surgeon: Danie Binder, MD;  Location: [facility];  Service: Endoscopy;  Laterality: N/A;  115     Medications: Prior to Admission medications   Medication Sig Start Date End Date Taking? Authorizing Provider  acetaminophen (TYLENOL) 500 MG tablet Take 500 mg by mouth every 6 (six) hours as needed for headache.   Yes Historical Provider, MD  albuterol (PROVENTIL) (2.5 MG/3ML) 0.083% nebulizer solution Take 3 mLs (2.5 mg total) by nebulization every 6 (six) hours as needed for wheezing or shortness of breath. 02/09/15  Yes Heath Lark, MD  Apoaequorin (PREVAGEN PO) Take 1 capsule by mouth daily.   Yes Historical Provider, MD  Artificial Tear Ointment (DRY EYES OP) Apply 1 drop to eye daily.   Yes Historical Provider, MD  Ascorbic Acid (VITAMIN C PO) Take 1 tablet by mouth daily.   Yes Historical Provider, MD  atorvastatin (LIPITOR) 20 MG tablet TAKE 1/2 TABLET TWICE DAILY 11/10/14  Yes Aleksei Plotnikov V, MD  Cholecalciferol (VITAMIN D3) 1000 UNITS tablet Take 1,000 Units by mouth daily.     Yes Historical Provider, MD  diltiazem 2 % GEL Apply 1 application topically 3 (three) times daily. 12/28/14  Yes Aleksei Plotnikov V, MD  donepezil (ARICEPT) 10 MG tablet TAKE 1 TABLET AT BEDTIME 05/10/14  Yes Aleksei Plotnikov V, MD  fluticasone (FLONASE) 50 MCG/ACT nasal spray Place 2 sprays into both nostrils daily. 05/26/14  Yes Aleksei Plotnikov V, MD  folic acid (FOLVITE) [id number] MCG tablet Take 400 mcg by mouth daily.    Yes Historical Provider, MD  levothyroxine (SYNTHROID, LEVOTHROID) 100 MCG tablet Take 1 tablet (100 mcg total) by mouth daily. 09/27/14  Yes Aleksei Plotnikov V, MD  metoprolol succinate (TOPROL-XL) 25 MG  24 hr tablet Take 0.5 tablets (12.5 mg total) by mouth 2 (two) times daily. 12/28/14  Yes Aleksei Plotnikov V, MD  nitroGLYCERIN (NITROSTAT) 0.4 MG SL tablet Place 1 tablet (0.4 mg total) under the tongue every 5 (five) minutes as needed. Chest pain 07/26/14 07/24/16 Yes Aleksei Plotnikov V, MD  Phenyleph-Doxylamine-DM-APAP (VICKS DAYQUIL/NYQUIL CLD & FLU PO) Take 2 capsules by mouth daily as needed (cold symtpoms).   Yes Historical Provider, MD  triamcinolone (NASACORT) 55 MCG/ACT nasal inhaler Place 1 spray into the nose daily. 05/01/12  Yes Aleksei Plotnikov V, MD  VITAMIN E PO Take 1 capsule by mouth daily.   Yes Historical Provider, MD  pantoprazole (PROTONIX) 40 MG tablet Take 1 tablet (40 mg total) by mouth daily. Patient taking differently: Take 40 mg by mouth 2 (two) times daily.  09/20/14   Orvil Feil, NP    Allergies:   Allergies  Allergen Reactions  . Aleve [Naproxen Sodium] Other (See Comments)    Bleeding ulcers  . Ibuprofen Other (See Comments)    Bleeding ulcers    Social History:  Ambulatory  independently  Lives at home alone,       reports that he quit smoking about 38 years ago. He has never used smokeless tobacco. He reports that he does not drink alcohol or use illicit drugs.    Family History: family history includes Cancer in his sister.    Physical Exam: Patient Vitals for the past 24 hrs:  BP Temp Temp src Pulse Resp SpO2 Height Weight  03/11/15 2315 - - - - - 97 % - -  03/11/15 2300 130/58 mmHg - - 78 14 (!) 89 % - -  03/11/15 2230 124/62 mmHg - - 73 16 (!) 86 % - -  03/11/15 2203 141/73 mmHg - - 73 15 92 % - -  03/11/15 2100 138/72 mmHg - - 65 15 97 % - -  03/11/15 2030 137/67 mmHg - - 76 19 (!) 74 % - -  03/11/15 2000 149/67 mmHg - - 61 13 93 % - -  03/11/15 1954 148/78 mmHg 97.4 F (36.3 C) Oral 66 18 92 % 5\' 4"  (1.626 m) 68.947 kg (152 lb)  03/11/15 1946 - - - - - 94 % - -    1. General:  in No Acute distress 2. Psychological: Alert and    Oriented 3. Head/ENT:   Dry Mucous Membranes                          Head   traumatic, neck supple                          Normal    Dentition 4. SKIN:  decreased Skin turgor,  Skin clean Dry and intact no rash 5. Heart: Regular rate and rhythm no Murmur, Rub or gallop 6. Lungs: Clear to auscultation bilaterally, no wheezes or crackles   7. Abdomen: Soft, non-tender, Non distended 8. Lower extremities: no clubbing, cyanosis, or edema 9. Neurologically Grossly intact, moving all 4 extremities equally 10. MSK: Normal range of motion  body mass index is 26.08 kg/(m^2).   Labs on Admission:   Results for orders placed or performed during the hospital encounter of 03/11/15 (from the past 24 hour(s))  Basic metabolic panel     Status: Abnormal   Collection Time: 03/11/15  8:55 PM  Result Value Ref Range   Sodium 136 135 - 145 mmol/L   Potassium 4.8 3.5 - 5.1 mmol/L   Chloride 102 101 - 111 mmol/L   CO2 25 22 - 32 mmol/L   Glucose, Bld 109 (H) 65 - 99 mg/dL   BUN 36 (H) 6 - 20 mg/dL   Creatinine, Ser 1.67 (H) 0.61 - 1.24 mg/dL   Calcium 8.8 (L) 8.9 - 10.3 mg/dL   GFR calc non Af Amer 34 (L) >60 mL/min   GFR calc Af Amer 40 (L) >60 mL/min   Anion gap 9 5 - 15  CBC with Differential     Status: Abnormal   Collection Time: 03/11/15  8:55 PM  Result Value Ref Range   WBC 15.0 (H) 4.0 - 10.5 K/uL   RBC 4.09 (L) 4.22 - 5.81 MIL/uL   Hemoglobin 12.8 (L) 13.0 - 17.0 g/dL   HCT 37.5 (L) 39.0 - 52.0 %   MCV 91.7 78.0 - 100.0 fL   MCH 31.3 26.0 - 34.0 pg   MCHC 34.1 30.0 - 36.0 g/dL   RDW 14.0 11.5 - 15.5 %   Platelets 141 (L) 150 -  400 K/uL   Neutrophils Relative % 77 %   Neutro Abs 11.3 (H) 1.7 - 7.7 K/uL   Lymphocytes Relative 8 %   Lymphs Abs 1.3 0.7 - 4.0 K/uL   Monocytes Relative 13 %   Monocytes Absolute 2.0 (H) 0.1 - 1.0 K/uL   Eosinophils Relative 2 %   Eosinophils Absolute 0.4 0.0 - 0.7 K/uL   Basophils Relative 0 %   Basophils Absolute 0.0 0.0 - 0.1 K/uL    UA not  obtained  No results found for: HGBA1C  Estimated Creatinine Clearance: 24.6 mL/min (by C-G formula based on Cr of 1.67).  BNP (last 3 results) No results for input(s): PROBNP in the last 8760 hours.  Other results:  I have pearsonaly reviewed this: ECG REPORT not obtained Filed Weights   03/11/15 1954  Weight: 68.947 kg (152 lb)     Cultures:    Component Value Date/Time   SDES BLOOD RIGHT HAND 11/02/2014 0925   SPECREQUEST BOTTLES DRAWN AEROBIC ONLY [place] 11/02/2014 0925   CULT  11/02/2014 0925    NO GROWTH 5 DAYS Performed at [facility] 11/07/2014 FINAL 11/02/2014 0925     Radiological Exams on Admission: Ct Head Wo Contrast  03/11/2015  CLINICAL DATA:  Trip and fall injury. Headache. No loss of consciousness. EXAM: CT HEAD WITHOUT CONTRAST CT CERVICAL SPINE WITHOUT CONTRAST TECHNIQUE: Multidetector CT imaging of the head and cervical spine was performed following the standard protocol without intravenous contrast. Multiplanar CT image reconstructions of the cervical spine were also generated. COMPARISON:  PET-CT scan 02/07/2015. CT head and cervical spine 04/30/2014. FINDINGS: CT HEAD FINDINGS Postoperative changes with left parietal craniectomy. Diffuse cerebral atrophy. Mild ventricular dilatation consistent with central atrophy. Low-attenuation changes in the deep white matter consistent small vessel ischemia. No mass effect or midline shift. No abnormal extra-axial fluid collections. Gray-white matter junctions are distinct. Basal cisterns are not effaced. No evidence of acute intracranial hemorrhage. No depressed skull fractures. Mastoid air cells are not opacified. Opacification of several ethmoid air cells bilaterally. Small air-fluid levels in the maxillary antra bilaterally. This may indicate effusion or acute sinusitis. No facial bone fractures are demonstrated within the field of view. Vascular calcifications. CT CERVICAL SPINE FINDINGS There is  increased usual cervical lordosis likely associated with thoracic kyphosis. No anterior subluxation of cervical vertebrae. Normal alignment of the facet joints. C1-2 articulation appears intact. Diffuse bone demineralization. Diffuse degenerative changes throughout the cervical spine. Compression deformities are demonstrated at T2, T3, and T4 with kyphoplasty changes at T4. These findings are unchanged since a previous CT chest from 10/26/2014. Old compression is suggested. No prevertebral soft tissue swelling. No focal bone lesion or bone destruction. IMPRESSION: No acute intracranial abnormalities. Diffuse atrophy and small vessel ischemic changes. Postoperative left parietal craniectomy. Air-fluid levels in the maxillary antra bilaterally. Diffuse bone demineralization and degenerative change in the cervical spine. No acute displaced fractures identified. Old compression deformities at T2, T3, and T4. Electronically Signed   By: Lucienne Capers M.D.   On: 03/11/2015 22:08   Ct Cervical Spine Wo Contrast  03/11/2015  CLINICAL DATA:  Trip and fall injury. Headache. No loss of consciousness. EXAM: CT HEAD WITHOUT CONTRAST CT CERVICAL SPINE WITHOUT CONTRAST TECHNIQUE: Multidetector CT imaging of the head and cervical spine was performed following the standard protocol without intravenous contrast. Multiplanar CT image reconstructions of the cervical spine were also generated. COMPARISON:  PET-CT scan 02/07/2015. CT head and cervical spine 04/30/2014. FINDINGS:  CT HEAD FINDINGS Postoperative changes with left parietal craniectomy. Diffuse cerebral atrophy. Mild ventricular dilatation consistent with central atrophy. Low-attenuation changes in the deep white matter consistent small vessel ischemia. No mass effect or midline shift. No abnormal extra-axial fluid collections. Gray-white matter junctions are distinct. Basal cisterns are not effaced. No evidence of acute intracranial hemorrhage. No depressed skull  fractures. Mastoid air cells are not opacified. Opacification of several ethmoid air cells bilaterally. Small air-fluid levels in the maxillary antra bilaterally. This may indicate effusion or acute sinusitis. No facial bone fractures are demonstrated within the field of view. Vascular calcifications. CT CERVICAL SPINE FINDINGS There is increased usual cervical lordosis likely associated with thoracic kyphosis. No anterior subluxation of cervical vertebrae. Normal alignment of the facet joints. C1-2 articulation appears intact. Diffuse bone demineralization. Diffuse degenerative changes throughout the cervical spine. Compression deformities are demonstrated at T2, T3, and T4 with kyphoplasty changes at T4. These findings are unchanged since a previous CT chest from 10/26/2014. Old compression is suggested. No prevertebral soft tissue swelling. No focal bone lesion or bone destruction. IMPRESSION: No acute intracranial abnormalities. Diffuse atrophy and small vessel ischemic changes. Postoperative left parietal craniectomy. Air-fluid levels in the maxillary antra bilaterally. Diffuse bone demineralization and degenerative change in the cervical spine. No acute displaced fractures identified. Old compression deformities at T2, T3, and T4. Electronically Signed   By: Lucienne Capers M.D.   On: 03/11/2015 22:08   Dg Hip Unilat With Pelvis 2-3 Views Right  03/11/2015  CLINICAL DATA:  Right hip pain.  Fall. EXAM: DG HIP (WITH OR WITHOUT PELVIS) 2-3V RIGHT COMPARISON:  None. FINDINGS: Previous right hip arthroplasty. There is an acute periprosthetic fracture involving the proximal shaft of the right femur just below the greater trochanter. No dislocation identified. IMPRESSION: 1. Acute periprosthetic fracture involving the proximal right femur inches below the greater trochanter. Electronically Signed   By: Kerby Moors M.D.   On: 03/11/2015 20:52    Chart has been reviewed  Family   at  Bedside  plan of care  was discussed with   Welsey Dia [id number] home  [phone] cell  Assessment/Plan 81 year old gentleman with history of hypertension, systolic heart failure with EF of 30-35 percent, status post defibrillator placement status post right hip replacement presents after a fall resulting in the proximal periprosthetic hip fracture. Orthopedics felt this was not operable recommended outpatient follow-up unfortunately patient unable to bear weight secondary to severe pain being admitted for pain management PT OT evaluation  Present on Admission:  . Right femoral fracture, closed, initial encounter no operative intervention required- admit for pain management PT OT evaluation may need placement, orthopedics consult tomorrow . Pain in joint, lower leg pain management as per hip protocol  . Essential hypertension continue home medications  . Chronic systolic heart failure (HCC) coronary appears to be hemodynamically stable and euvolemic will continue home medication  . Automatic implantable cardioverter-defibrillator in situ currently appears to be stable  . Diffuse large B cell lymphoma ([place]) patient is followed up by hematology oncology per family recently has completed therapy and has been doing well  . COPD bronchitis currently appears to be stable will continue home medications patient was noted to be hypoxic transiently after administration of narcotics currently on 2 L of oxygen satting well we will avoid oversedation     Prophylaxis:   Lovenox   CODE STATUS:  FULL CODE  as per patient    Disposition likely will need placement for rehabilitation  ordered PT OT and social work on's                      Other plan as per orders.  I have spent a total of 55 min on this admission  Annaleah Arata 03/11/2015, 11:30 PM  [facility]  Pager [phone]   after 2 AM please page floor coverage PA If 7AM-7PM, please contact the day team taking care of the patient  [email or web address]    Password [email or web address]

## 2015-03-11 NOTE — ED Notes (Signed)
Pt's c-collar removed by Dr. Eulis Foster.  Jeans removed.

## 2015-03-11 NOTE — ED Notes (Signed)
Nurse drawing labs. 

## 2015-03-12 ENCOUNTER — Encounter (HOSPITAL_COMMUNITY): Payer: Self-pay | Admitting: *Deleted

## 2015-03-12 DIAGNOSIS — Z87891 Personal history of nicotine dependence: Secondary | ICD-10-CM | POA: Diagnosis not present

## 2015-03-12 DIAGNOSIS — Z9181 History of falling: Secondary | ICD-10-CM | POA: Diagnosis not present

## 2015-03-12 DIAGNOSIS — J449 Chronic obstructive pulmonary disease, unspecified: Secondary | ICD-10-CM | POA: Diagnosis present

## 2015-03-12 DIAGNOSIS — S72044A Nondisplaced fracture of base of neck of right femur, initial encounter for closed fracture: Secondary | ICD-10-CM | POA: Diagnosis not present

## 2015-03-12 DIAGNOSIS — S0093XA Contusion of unspecified part of head, initial encounter: Secondary | ICD-10-CM | POA: Diagnosis present

## 2015-03-12 DIAGNOSIS — E039 Hypothyroidism, unspecified: Secondary | ICD-10-CM | POA: Diagnosis present

## 2015-03-12 DIAGNOSIS — I2510 Atherosclerotic heart disease of native coronary artery without angina pectoris: Secondary | ICD-10-CM | POA: Diagnosis present

## 2015-03-12 DIAGNOSIS — N183 Chronic kidney disease, stage 3 unspecified: Secondary | ICD-10-CM | POA: Diagnosis present

## 2015-03-12 DIAGNOSIS — S72001S Fracture of unspecified part of neck of right femur, sequela: Secondary | ICD-10-CM | POA: Diagnosis not present

## 2015-03-12 DIAGNOSIS — I5042 Chronic combined systolic (congestive) and diastolic (congestive) heart failure: Secondary | ICD-10-CM

## 2015-03-12 DIAGNOSIS — Z8249 Family history of ischemic heart disease and other diseases of the circulatory system: Secondary | ICD-10-CM | POA: Diagnosis not present

## 2015-03-12 DIAGNOSIS — Z95810 Presence of automatic (implantable) cardiac defibrillator: Secondary | ICD-10-CM | POA: Diagnosis not present

## 2015-03-12 DIAGNOSIS — M1990 Unspecified osteoarthritis, unspecified site: Secondary | ICD-10-CM | POA: Diagnosis present

## 2015-03-12 DIAGNOSIS — Z4789 Encounter for other orthopedic aftercare: Secondary | ICD-10-CM | POA: Diagnosis not present

## 2015-03-12 DIAGNOSIS — Z79899 Other long term (current) drug therapy: Secondary | ICD-10-CM | POA: Diagnosis not present

## 2015-03-12 DIAGNOSIS — M810 Age-related osteoporosis without current pathological fracture: Secondary | ICD-10-CM | POA: Diagnosis present

## 2015-03-12 DIAGNOSIS — R262 Difficulty in walking, not elsewhere classified: Secondary | ICD-10-CM | POA: Diagnosis not present

## 2015-03-12 DIAGNOSIS — R278 Other lack of coordination: Secondary | ICD-10-CM | POA: Diagnosis not present

## 2015-03-12 DIAGNOSIS — N189 Chronic kidney disease, unspecified: Secondary | ICD-10-CM

## 2015-03-12 DIAGNOSIS — D696 Thrombocytopenia, unspecified: Secondary | ICD-10-CM

## 2015-03-12 DIAGNOSIS — E038 Other specified hypothyroidism: Secondary | ICD-10-CM

## 2015-03-12 DIAGNOSIS — M25551 Pain in right hip: Secondary | ICD-10-CM | POA: Diagnosis not present

## 2015-03-12 DIAGNOSIS — J9611 Chronic respiratory failure with hypoxia: Secondary | ICD-10-CM | POA: Diagnosis present

## 2015-03-12 DIAGNOSIS — C8330 Diffuse large B-cell lymphoma, unspecified site: Secondary | ICD-10-CM | POA: Diagnosis present

## 2015-03-12 DIAGNOSIS — D631 Anemia in chronic kidney disease: Secondary | ICD-10-CM

## 2015-03-12 DIAGNOSIS — M79604 Pain in right leg: Secondary | ICD-10-CM | POA: Diagnosis not present

## 2015-03-12 DIAGNOSIS — M79641 Pain in right hand: Secondary | ICD-10-CM | POA: Diagnosis not present

## 2015-03-12 DIAGNOSIS — M6281 Muscle weakness (generalized): Secondary | ICD-10-CM | POA: Diagnosis not present

## 2015-03-12 DIAGNOSIS — Z96641 Presence of right artificial hip joint: Secondary | ICD-10-CM | POA: Diagnosis present

## 2015-03-12 DIAGNOSIS — R413 Other amnesia: Secondary | ICD-10-CM

## 2015-03-12 DIAGNOSIS — D72829 Elevated white blood cell count, unspecified: Secondary | ICD-10-CM | POA: Diagnosis present

## 2015-03-12 DIAGNOSIS — W010XXA Fall on same level from slipping, tripping and stumbling without subsequent striking against object, initial encounter: Secondary | ICD-10-CM | POA: Diagnosis present

## 2015-03-12 DIAGNOSIS — R609 Edema, unspecified: Secondary | ICD-10-CM | POA: Diagnosis not present

## 2015-03-12 DIAGNOSIS — M79631 Pain in right forearm: Secondary | ICD-10-CM | POA: Diagnosis not present

## 2015-03-12 DIAGNOSIS — T40695A Adverse effect of other narcotics, initial encounter: Secondary | ICD-10-CM | POA: Diagnosis present

## 2015-03-12 DIAGNOSIS — E785 Hyperlipidemia, unspecified: Secondary | ICD-10-CM | POA: Diagnosis present

## 2015-03-12 DIAGNOSIS — S7291XA Unspecified fracture of right femur, initial encounter for closed fracture: Secondary | ICD-10-CM | POA: Diagnosis present

## 2015-03-12 DIAGNOSIS — R41841 Cognitive communication deficit: Secondary | ICD-10-CM | POA: Diagnosis not present

## 2015-03-12 DIAGNOSIS — Z809 Family history of malignant neoplasm, unspecified: Secondary | ICD-10-CM | POA: Diagnosis not present

## 2015-03-12 DIAGNOSIS — I130 Hypertensive heart and chronic kidney disease with heart failure and stage 1 through stage 4 chronic kidney disease, or unspecified chronic kidney disease: Secondary | ICD-10-CM | POA: Diagnosis present

## 2015-03-12 DIAGNOSIS — M79621 Pain in right upper arm: Secondary | ICD-10-CM | POA: Diagnosis not present

## 2015-03-12 DIAGNOSIS — H9190 Unspecified hearing loss, unspecified ear: Secondary | ICD-10-CM | POA: Diagnosis present

## 2015-03-12 DIAGNOSIS — S72001A Fracture of unspecified part of neck of right femur, initial encounter for closed fracture: Secondary | ICD-10-CM | POA: Diagnosis present

## 2015-03-12 DIAGNOSIS — J9601 Acute respiratory failure with hypoxia: Secondary | ICD-10-CM | POA: Diagnosis not present

## 2015-03-12 LAB — URINALYSIS, ROUTINE W REFLEX MICROSCOPIC
Bilirubin Urine: NEGATIVE
GLUCOSE, UA: NEGATIVE mg/dL
Hgb urine dipstick: NEGATIVE
Ketones, ur: NEGATIVE mg/dL
LEUKOCYTES UA: NEGATIVE
NITRITE: NEGATIVE
PH: 5.5 (ref 5.0–8.0)
PROTEIN: NEGATIVE mg/dL
Specific Gravity, Urine: 1.013 (ref 1.005–1.030)

## 2015-03-12 LAB — CBC
HEMATOCRIT: 35.3 % — AB (ref 39.0–52.0)
Hemoglobin: 11.9 g/dL — ABNORMAL LOW (ref 13.0–17.0)
MCH: 31.2 pg (ref 26.0–34.0)
MCHC: 33.7 g/dL (ref 30.0–36.0)
MCV: 92.4 fL (ref 78.0–100.0)
PLATELETS: 126 10*3/uL — AB (ref 150–400)
RBC: 3.82 MIL/uL — ABNORMAL LOW (ref 4.22–5.81)
RDW: 14.1 % (ref 11.5–15.5)
WBC: 10.4 10*3/uL (ref 4.0–10.5)

## 2015-03-12 LAB — BASIC METABOLIC PANEL
ANION GAP: 6 (ref 5–15)
BUN: 38 mg/dL — ABNORMAL HIGH (ref 6–20)
CO2: 26 mmol/L (ref 22–32)
Calcium: 8.6 mg/dL — ABNORMAL LOW (ref 8.9–10.3)
Chloride: 106 mmol/L (ref 101–111)
Creatinine, Ser: 1.82 mg/dL — ABNORMAL HIGH (ref 0.61–1.24)
GFR calc non Af Amer: 31 mL/min — ABNORMAL LOW (ref >60)
GFR, EST AFRICAN AMERICAN: 36 mL/min — AB (ref >60)
GLUCOSE: 106 mg/dL — AB (ref 65–99)
POTASSIUM: 5.1 mmol/L (ref 3.5–5.1)
Sodium: 138 mmol/L (ref 135–145)

## 2015-03-12 LAB — ALBUMIN: Albumin: 3.8 g/dL (ref 3.5–5.0)

## 2015-03-12 MED ORDER — ALBUTEROL SULFATE (2.5 MG/3ML) 0.083% IN NEBU
2.5000 mg | INHALATION_SOLUTION | Freq: Four times a day (QID) | RESPIRATORY_TRACT | Status: DC | PRN
  Administered 2015-03-13 – 2015-03-16 (×4): 2.5 mg via RESPIRATORY_TRACT
  Filled 2015-03-12 (×4): qty 3

## 2015-03-12 MED ORDER — HYDROCODONE-ACETAMINOPHEN 5-325 MG PO TABS
1.0000 | ORAL_TABLET | Freq: Four times a day (QID) | ORAL | Status: DC | PRN
  Administered 2015-03-13: 1 via ORAL
  Filled 2015-03-12: qty 1

## 2015-03-12 MED ORDER — BISACODYL 10 MG RE SUPP: 10.0000 mg | Freq: Every day | RECTAL | Status: DC | PRN

## 2015-03-12 MED ORDER — LEVOTHYROXINE SODIUM 100 MCG PO TABS
100.0000 ug | ORAL_TABLET | Freq: Every day | ORAL | Status: DC
  Administered 2015-03-12 – 2015-03-16 (×5): 100 ug via ORAL
  Filled 2015-03-12 (×8): qty 1

## 2015-03-12 MED ORDER — MORPHINE SULFATE (PF) 2 MG/ML IV SOLN
0.5000 mg | INTRAVENOUS | Status: DC | PRN
  Administered 2015-03-13: 0.5 mg via INTRAVENOUS
  Filled 2015-03-12: qty 1

## 2015-03-12 MED ORDER — METOPROLOL SUCCINATE 12.5 MG HALF TABLET
12.5000 mg | ORAL_TABLET | Freq: Two times a day (BID) | ORAL | Status: DC
  Administered 2015-03-12 – 2015-03-16 (×7): 12.5 mg via ORAL
  Filled 2015-03-12 (×12): qty 1

## 2015-03-12 MED ORDER — DONEPEZIL HCL 10 MG PO TABS
10.0000 mg | ORAL_TABLET | Freq: Every day | ORAL | Status: DC
  Administered 2015-03-12 – 2015-03-15 (×5): 10 mg via ORAL
  Filled 2015-03-12 (×6): qty 1

## 2015-03-12 MED ORDER — ATORVASTATIN CALCIUM 10 MG PO TABS
10.0000 mg | ORAL_TABLET | Freq: Two times a day (BID) | ORAL | Status: DC
  Administered 2015-03-12 – 2015-03-16 (×10): 10 mg via ORAL
  Filled 2015-03-12 (×13): qty 1

## 2015-03-12 MED ORDER — SENNA 8.6 MG PO TABS
1.0000 | ORAL_TABLET | Freq: Two times a day (BID) | ORAL | Status: DC
  Administered 2015-03-12 – 2015-03-16 (×10): 8.6 mg via ORAL
  Filled 2015-03-12 (×8): qty 1

## 2015-03-12 MED ORDER — ENOXAPARIN SODIUM 30 MG/0.3ML ~~LOC~~ SOLN
30.0000 mg | SUBCUTANEOUS | Status: DC
  Administered 2015-03-12: 30 mg via SUBCUTANEOUS
  Filled 2015-03-12: qty 0.3

## 2015-03-12 MED ORDER — METHOCARBAMOL 500 MG PO TABS
500.0000 mg | ORAL_TABLET | Freq: Four times a day (QID) | ORAL | Status: DC | PRN
  Administered 2015-03-12 – 2015-03-15 (×5): 500 mg via ORAL
  Filled 2015-03-12 (×6): qty 1

## 2015-03-12 MED ORDER — METHOCARBAMOL 1000 MG/10ML IJ SOLN
500.0000 mg | Freq: Four times a day (QID) | INTRAMUSCULAR | Status: DC | PRN
  Filled 2015-03-12: qty 5

## 2015-03-12 MED ORDER — POLYETHYLENE GLYCOL 3350 17 G PO PACK: 17.0000 g | PACK | Freq: Every day | ORAL | Status: DC | PRN

## 2015-03-12 MED ORDER — DOCUSATE SODIUM 100 MG PO CAPS
100.0000 mg | ORAL_CAPSULE | Freq: Two times a day (BID) | ORAL | Status: DC
  Administered 2015-03-12 – 2015-03-16 (×10): 100 mg via ORAL
  Filled 2015-03-12 (×11): qty 1

## 2015-03-12 NOTE — Clinical Social Work Placement (Signed)
   CLINICAL SOCIAL WORK PLACEMENT  NOTE  Date:  03/12/2015  Patient Details  Name: [name] [name] MRN: [id number] Date of Birth: October 08, 1922  Clinical Social Work is seeking post-discharge placement for this patient at the [place] level of care (*CSW will initial, date and re-position this form in  chart as items are completed):  Yes   Patient/family provided with [place] Work Department's list of facilities offering this level of care within the geographic area requested by the patient (or if unable, by the patient's family).  Yes   Patient/family informed of their freedom to choose among providers that offer the needed level of care, that participate in Medicare, Medicaid or managed care program needed by the patient, have an available bed and are willing to accept the patient.  Yes   Patient/family informed of [facility]'s ownership interest in [facility] and [facility], as well as of the fact that they are under no obligation to receive care at these facilities.  PASRR submitted to EDS on       PASRR number received on       Existing PASRR number confirmed on 03/12/15     FL2 transmitted to all facilities in geographic area requested by pt/family on 03/12/15     FL2 transmitted to all facilities within larger geographic area on       Patient informed that his/her managed care company has contracts with or will negotiate with certain facilities, including the following:            Patient/family informed of bed offers received.  Patient chooses bed at       Physician recommends and patient chooses bed at      Patient to be transferred to   on  .  Patient to be transferred to facility by       Patient family notified on   of transfer.  Name of family member notified:        PHYSICIAN Please sign FL2     Additional Comment:    _______________________________________________ Cranford Mon, LCSW 03/12/2015, 3:23  PM

## 2015-03-12 NOTE — NC FL2 (Signed)
[place] LEVEL OF CARE SCREENING TOOL     IDENTIFICATION  Patient Name: [name] [name] Birthdate: 1922-08-22 Sex: male Admission Date (Current Location): 03/11/2015  [facility] and [place] Number: Engineer, manufacturing systems and Address:  [facility],  [place] [address] [place]      Provider Number: [id number]  Attending Physician Name and Address:  Robbie Lis, MD  Relative Name and Phone Number:       Current Level of Care: Hospital Recommended Level of Care: [place] Prior Approval Number:    Date Approved/Denied:   PASRR Number: [id number]  Discharge Plan: SNF    Current Diagnoses: Patient Active Problem List   Diagnosis Date Noted  . Dyslipidemia 03/12/2015  . Leukocytosis 03/12/2015  . Chronic combined systolic and diastolic CHF (congestive heart failure) ([place]) 03/12/2015  . CKD (chronic kidney disease) stage 3, GFR 30-59 ml/min 03/12/2015  . Acute respiratory failure with hypoxemia ([place]) 03/12/2015  . Right femoral fracture, closed, initial encounter 03/12/2015  . Fracture of proximal end of right femur ([place]) 03/11/2015  . Diffuse large B cell lymphoma ([place]) 10/27/2014  . Thrombocytopenia ([place]) 10/26/2014  . Anemia in chronic renal disease 06/18/2014  . Automatic implantable cardioverter-defibrillator in situ 12/30/2008  . Memory loss 06/21/2008  . Hypothyroidism 01/08/2007  . Essential hypertension 01/08/2007    Orientation RESPIRATION BLADDER Height & Weight    Self, Time, Situation, Place  O2 (2L [place])   5\' 4"  (162.6 cm) 152 lbs.  BEHAVIORAL SYMPTOMS/MOOD NEUROLOGICAL BOWEL NUTRITION STATUS           AMBULATORY STATUS COMMUNICATION OF NEEDS Skin   Extensive Assist Verbally                         Personal Care Assistance Level of Assistance  Bathing, Dressing Bathing Assistance: Maximum assistance   Dressing Assistance: Maximum assistance     Functional Limitations Info  Sight Sight Info:  Impaired        SPECIAL CARE FACTORS FREQUENCY                       Contractures      Additional Factors Info  Code Status, Allergies Code Status Info: FULL Allergies Info: Aleve, Ibuprofen           Current Medications (03/12/2015):  This is the current hospital active medication list Current Facility-Administered Medications  Medication Dose Route Frequency Provider Last Rate Last Dose  . 0.9 %  sodium chloride infusion   Intravenous Continuous Robbie Lis, MD 10 mL/hr at 03/12/15 1410    . albuterol (PROVENTIL) (2.5 MG/3ML) 0.083% nebulizer solution 2.5 mg  2.5 mg Nebulization Q6H PRN Toy Baker, MD      . atorvastatin (LIPITOR) tablet 10 mg  10 mg Oral BID Toy Baker, MD   10 mg at 03/12/15 1012  . bisacodyl (DULCOLAX) suppository 10 mg  10 mg Rectal Daily PRN Toy Baker, MD      . docusate sodium (COLACE) capsule 100 mg  100 mg Oral BID Toy Baker, MD   100 mg at 03/12/15 1012  . donepezil (ARICEPT) tablet 10 mg  10 mg Oral QHS Toy Baker, MD   10 mg at 03/12/15 0300  . HYDROcodone-acetaminophen (NORCO/VICODIN) 5-325 MG per tablet 1-2 tablet  1-2 tablet Oral Q6H PRN Toy Baker, MD      . levothyroxine (SYNTHROID, LEVOTHROID) tablet 100 mcg  100 mcg  Oral QAC breakfast Toy Baker, MD   100 mcg at 03/12/15 0857  . methocarbamol (ROBAXIN) tablet 500 mg  500 mg Oral Q6H PRN Toy Baker, MD       Or  . methocarbamol (ROBAXIN) 500 mg in dextrose 5 % 50 mL IVPB  500 mg Intravenous Q6H PRN Toy Baker, MD      . metoprolol succinate (TOPROL-XL) 24 hr tablet 12.5 mg  12.5 mg Oral BID Toy Baker, MD   12.5 mg at 03/12/15 1012  . morphine 2 MG/ML injection 0.5 mg  0.5 mg Intravenous Q2H PRN Toy Baker, MD      . polyethylene glycol (MIRALAX / GLYCOLAX) packet 17 g  17 g Oral Daily PRN Toy Baker, MD      . senna (SENOKOT) tablet 8.6 mg  1 tablet Oral BID Toy Baker, MD   8.6  mg at 03/12/15 1013     Discharge Medications: Please see discharge summary for a list of discharge medications.  Relevant Imaging Results:  Relevant Lab Results:   Additional Information SS#: [id number]  [name] [name], [name] [name]

## 2015-03-12 NOTE — Clinical Social Work Note (Signed)
Clinical Social Work Assessment  Patient Details  Name: [name] [name] MRN: [id number] Date of Birth: [birth year]  Date of referral:  03/12/15               Reason for consult:  Facility Placement                Permission sought to share information with:  Family Supports, Chartered certified accountant granted to share information::  Yes, Verbal Permission Granted  Name::     [name] SNF's- first choice: [place]::     Relationship::     Contact Information:     Housing/Transportation Living arrangements for the past 2 months:  [place] of Information:  Patient, Adult Children Patient Interpreter Needed:  None Criminal Activity/Legal Involvement Pertinent to Current Situation/Hospitalization:  No - Comment as needed Significant Relationships:  Adult Children Lives with:    Do you feel safe going back to the place where you live?  Yes Need for family participation in patient care:  Yes (Comment)  Care giving concerns: Patient lives alone- has new hip fracture (with old fx of same hip in the past)  Non-surgical hip?   Social Worker assessment / plan: CSW met with patient along with daughter [name] [name] and son-in-law to discuss d/c plans. Patient had indicated that he has been a resident of the [facility] in the past and would be agreeable to return there if needed for further rehab.  At this time- Physical Therapy has not seen patient and are awaiting ortho consult.  Patient lives alone and neither his son or daughter can live in with patient.  He is Observation status per [facility] with no indication that this status will change. Due to not having a 3 day inpatient qualifying stay- he would have to pay privately at a SNF or return home.  Patient has elected to pay privately and indicated that he has funds for same.  Fl2 initiated and place on chart. Active bed search initiated for [name].  Employment  status:  Retired Forensic scientist:  Medicare (Observation Status) PT Recommendations:  Not assessed at this time Information / Referral to community resources:  [place]  Patient/Family's Response to care:  Patient and daughter feel he is receiving appropriate care and services and deny any current concerns or questions.  Daughter indicated a desire to see if patient could be changed to inpatient status but is aware that this probably will not happen.   Patient/Family's Understanding of and Emotional Response to Diagnosis, Current Treatment, and Prognosis: Patient lives alone and is fully alert and oriented. He states that he fell while getting Christmas decorations down from his attic.  He is aware of his current medical needs as well as treatment needs. He has been placed in SNF in the past and stated that he had a very positive experience. He is willing to seek SNF again if needed.  Patient and family maintain a very positive attitude about care needs and deny any current concerns.  Emotional Assessment Appearance:  Appears stated age Attitude/Demeanor/Rapport:   (Responsive, calm, involved and relaxed) Affect (typically observed):  Pleasant, Calm, Quiet Orientation:  Oriented to Self, Oriented to Place, Oriented to  Time, Oriented to Situation Alcohol / Substance use:  Never Used Psych involvement (Current and /or in the community):  No (Comment)  Discharge Needs  Concerns to be addressed:  Care Coordination, Home Safety Concerns, Financial / Insurance Concerns (  Lives alone.  No one to stay with him.  OBSERVATION Status- no 3 day qualifying 3 day hospital stay) Readmission within the last 30 days:  No Current discharge risk:  Lives alone, Dependent with Mobility Barriers to Discharge:  Continued Medical Work up   Estill Bakes 03/12/2015, 3:38 PM

## 2015-03-12 NOTE — Progress Notes (Addendum)
Patient ID: [name] [name], male   DOB: [birth year], 81 y.o.   MRN: [id number] [facility] PROGRESS NOTE  [facility] [id number] DOB: 07-03-1922 DOA: 03/11/2015 PCP: Walker Kehr, MD  Brief narrative:    81 y.o. male with past medical history of memory loss, chronic combined CHF (2 D ECHO in 2014 EF 35% and grade 2 diastolic dysfunction), DLBCL (on disease progression), CKD stage 3 (baseline Cr 1.7) who presented to [facility] ED status post fall. He sustained acute periprosthetic fracture of the right proximal femur and per ortho this is managed non operatively.  While in emergency department,  patient received 6 mg of morphine and became hypoxic but since then able to keep O2 sats above 90%.   Assessment/Plan:    Principal Problem:   Fracture of proximal end of right femur ([place]) / Acute periprosthetic fracture of the right proximal femur  - Ortho surgery consulted from ED but recommended non operative management  - Pt needs PT evaluation for safe discharge plan  - Continue pain management efforts   Active Problems:   Acute respiratory failure with hypoxemia - Due to narcotics - Now O2 sats above 92%    Hypothyroidism - Continue synthroid     Essential hypertension - Continue metoprolol    Chronic systolic and diastolic heart failure (HCC) / Automatic implantable cardioverter-defibrillator in situ - 2 D ECHO in 10/2012 with EF of 35% and grade 2 diastolic dysfunction - Compensated     Memory loss - Continue aricept    CKD (chronic kidney disease), stage 3 (HCC) - Baseline Cr 1.7 in 11/2014 - Cr on this admission 1.6 - 1.8, around baseline values     Anemia in chronic renal disease - Hemoglobin stable at 11.9    Diffuse large B cell lymphoma (HCC) - The PET CT scan previously showed no evidence of active disease. - From the lymphoma standpoint, pt had stage I disease. - F/U with Dr. Alvy Bimler     Thrombocytopenia [facility]) - Likely form history of DLBCL -  Platelets 141 --> 126 - Stop Lovenox subQ and use SCD's for DVT prophylaxis     Dyslipidemia - Continue atorvastatin     Leukocytosis - Likely reactive - No evidence of acute infectious process   DVT Prophylaxis  - Lovenox subQ stopped and using SCD's due to thrombocytopenia   Code Status: Full.  Family Communication:  Family not at the bedside this am  Disposition Plan: Home likely by 12/20.   IV access:  Peripheral IV  Procedures and diagnostic studies:    Ct Head Wo Contrast 03/11/2015 No acute intracranial abnormalities. Diffuse atrophy and small vessel ischemic changes. Postoperative left parietal craniectomy. Air-fluid levels in the maxillary antra bilaterally. Diffuse bone demineralization and degenerative change in the cervical spine. No acute displaced fractures identified. Old compression deformities at T2, T3, and T4. Electronically Signed   By: Lucienne Capers M.D.   On: 03/11/2015 22:08   Ct Cervical Spine Wo Contrast 03/11/2015 No acute intracranial abnormalities. Diffuse atrophy and small vessel ischemic changes. Postoperative left parietal craniectomy. Air-fluid levels in the maxillary antra bilaterally. Diffuse bone demineralization and degenerative change in the cervical spine. No acute displaced fractures identified. Old compression deformities at T2, T3, and T4. Electronically Signed   By: Lucienne Capers M.D.   On: 03/11/2015 22:08   Dg Chest Port 1 View 03/11/2015  1. Interstitial prominence bilaterally, similar to a chest x-ray of 10/26/2014 but significantly more prominent than on chest x-ray of  07/19/2013. This could represent worsening of a chronic interstitial thickening, however, I suspect at least some degree of acute interstitial edema related to mild volume overload. No evidence of pneumonia. 2. Stable cardiomediastinal silhouette. Electronically Signed   By: Franki Cabot M.D.   On: 03/11/2015 23:58   Dg Hip Unilat With Pelvis 2-3 Views Right 03/11/2015   1. Acute periprosthetic fracture involving the proximal right femur inches below the greater trochanter. Electronically Signed   By: Kerby Moors M.D.   On: 03/11/2015 20:52    Medical Consultants:  None   Other Consultants:  Physical therapy   IAnti-Infectives:   None    Leisa Lenz, MD  [facility] Pager [phone]  Time spent in minutes: 25 minutes  If 7PM-7AM, please contact night-coverage [email or web address] Password [email or web address] 03/12/2015, 1:31 PM      HPI/Subjective: No acute overnight events. No respiratory distress.   Objective: Filed Vitals:   03/12/15 0000 03/12/15 0030 03/12/15 0127 03/12/15 0458  BP: 112/61 128/65  113/51  Pulse:  72 73 63  Temp:   99.5 F (37.5 C) 99.1 F (37.3 C)  TempSrc:   Oral Oral  Resp: 0 16 18 18   Height:      Weight:      SpO2:  92% 97% 95%    Intake/Output Summary (Last 24 hours) at 03/12/15 1331 Last data filed at 03/12/15 1000  Gross per 24 hour  Intake   1720 ml  Output    500 ml  Net   1220 ml    Exam:   General:  Pt is alert, not in acute distress  Cardiovascular: Regular rate and rhythm, S1/S2 appreciated   Respiratory: Clear to auscultation bilaterally, no wheezing, no crackles, no rhonchi  Abdomen: Soft, non tender, non distended, bowel sounds present  Extremities: No edema, pulses DP and PT palpable bilaterally  Neuro: Grossly nonfocal  Data Reviewed: Basic Metabolic Panel:  Recent Labs Lab 03/11/15 2055 03/12/15 0441  NA 136 138  K 4.8 5.1  CL 102 106  CO2 25 26  GLUCOSE 109* 106*  BUN 36* 38*  CREATININE 1.67* 1.82*  CALCIUM 8.8* 8.6*   Liver Function Tests:  Recent Labs Lab 03/12/15 0441  ALBUMIN 3.8   No results for input(s): LIPASE, AMYLASE in the last 168 hours. No results for input(s): AMMONIA in the last 168 hours. CBC:  Recent Labs Lab 03/11/15 2055 03/12/15 0441  WBC 15.0* 10.4  NEUTROABS 11.3*  --   HGB 12.8* 11.9*  HCT 37.5* 35.3*  MCV 91.7 92.4  PLT 141*  126*   Cardiac Enzymes: No results for input(s): CKTOTAL, CKMB, CKMBINDEX, TROPONINI in the last 168 hours. BNP: Invalid input(s): POCBNP CBG: No results for input(s): GLUCAP in the last 168 hours.  No results found for this or any previous visit (from the past 240 hour(s)).   Scheduled Meds: . atorvastatin  10 mg Oral BID  . docusate sodium  100 mg Oral BID  . donepezil  10 mg Oral QHS  . enoxaparin (LOVENOX) injection  30 mg Subcutaneous Q24H  . levothyroxine  100 mcg Oral QAC breakfast  . metoprolol succinate  12.5 mg Oral BID  . senna  1 tablet Oral BID   Continuous Infusions: . sodium chloride 75 mL/hr at 03/12/15 1326

## 2015-03-12 NOTE — Progress Notes (Signed)
PT Cancellation Note  Patient Details Name: [name] [name] MRN: [id number] DOB: Mar 24, 1923   Cancelled Treatment:    Reason Eval/Treat Not Completed: Patient not medically ready (ortho consult pending)   [facility] 03/12/2015, 8:50 AM

## 2015-03-12 NOTE — Progress Notes (Signed)
Moved to 1527, no changes in assessment. [name] [name], [employer]

## 2015-03-13 ENCOUNTER — Inpatient Hospital Stay (HOSPITAL_COMMUNITY): Payer: Medicare Other

## 2015-03-13 ENCOUNTER — Ambulatory Visit (HOSPITAL_COMMUNITY): Payer: Medicare Other

## 2015-03-13 DIAGNOSIS — J9601 Acute respiratory failure with hypoxia: Secondary | ICD-10-CM

## 2015-03-13 DIAGNOSIS — L8990 Pressure ulcer of unspecified site, unspecified stage: Secondary | ICD-10-CM | POA: Insufficient documentation

## 2015-03-13 LAB — BASIC METABOLIC PANEL
ANION GAP: 8 (ref 5–15)
BUN: 42 mg/dL — ABNORMAL HIGH (ref 6–20)
CALCIUM: 8.2 mg/dL — AB (ref 8.9–10.3)
CO2: 24 mmol/L (ref 22–32)
CREATININE: 2.06 mg/dL — AB (ref 0.61–1.24)
Chloride: 102 mmol/L (ref 101–111)
GFR calc non Af Amer: 27 mL/min — ABNORMAL LOW (ref >60)
GFR, EST AFRICAN AMERICAN: 31 mL/min — AB (ref >60)
Glucose, Bld: 133 mg/dL — ABNORMAL HIGH (ref 65–99)
Potassium: 4.7 mmol/L (ref 3.5–5.1)
SODIUM: 134 mmol/L — AB (ref 135–145)

## 2015-03-13 LAB — CBC
HEMATOCRIT: 32.3 % — AB (ref 39.0–52.0)
HEMOGLOBIN: 10.8 g/dL — AB (ref 13.0–17.0)
MCH: 30.9 pg (ref 26.0–34.0)
MCHC: 33.4 g/dL (ref 30.0–36.0)
MCV: 92.6 fL (ref 78.0–100.0)
Platelets: 95 10*3/uL — ABNORMAL LOW (ref 150–400)
RBC: 3.49 MIL/uL — ABNORMAL LOW (ref 4.22–5.81)
RDW: 14.3 % (ref 11.5–15.5)
WBC: 12.1 10*3/uL — AB (ref 4.0–10.5)

## 2015-03-13 LAB — VITAMIN D 25 HYDROXY (VIT D DEFICIENCY, FRACTURES): VIT D 25 HYDROXY: 33.1 ng/mL (ref 30.0–100.0)

## 2015-03-13 MED ORDER — OXYCODONE-ACETAMINOPHEN 5-325 MG PO TABS
1.0000 | ORAL_TABLET | Freq: Four times a day (QID) | ORAL | Status: DC | PRN
  Administered 2015-03-13 – 2015-03-14 (×2): 1 via ORAL
  Administered 2015-03-14 – 2015-03-15 (×2): 2 via ORAL
  Administered 2015-03-15 – 2015-03-16 (×2): 1 via ORAL
  Filled 2015-03-13: qty 2
  Filled 2015-03-13 (×2): qty 1
  Filled 2015-03-13: qty 2
  Filled 2015-03-13: qty 1
  Filled 2015-03-13: qty 2
  Filled 2015-03-13: qty 1

## 2015-03-13 MED ORDER — HYDROMORPHONE HCL 1 MG/ML IJ SOLN
1.0000 mg | INTRAMUSCULAR | Status: DC | PRN
  Administered 2015-03-13: 1 mg via INTRAVENOUS
  Filled 2015-03-13: qty 1

## 2015-03-13 NOTE — Evaluation (Signed)
Physical Therapy Evaluation Patient Details Name: [name] [name] MRN: [id number] DOB: 09-17-1922 Today's Date: 03/13/2015   History of Present Illness  81 yo male adm 03/11/15  after fall resulting in R periprosthetic hip fx (non-operative management  per ortho); RUE xrays neg for fx; PMHx:  of Hypertension; Hypothyroid; CAD (coronary artery disease); Osteoarthritis; BPH (benign prostatic hypertrophy); Osteoporosis; Renal insufficiency; Hip fx ([place]); Sternum fx (2011); Urinary incontinence; Hearing loss; Seizures ; Cancer ; Diffuse large B cell lymphoma  ; Presence of permanent cardiac pacemaker; and COPD (chronic obstructive pulmonary disease) ([place]).   Clinical Impression  Pt admitted with above diagnosis. Pt currently with functional limitations due to the deficits listed below (see PT Problem List).  Pt will benefit from skilled PT to increase their independence and safety with mobility to allow discharge to the venue listed below.   PT eval limited by pain RUE this date; tp was premedicated; he is very cooperative but also sleepy d/t pain meds; pt in agreement regarding PT recommendation for post acute rehab at SNF    Follow Up Recommendations SNF    Equipment Recommendations  None recommended by PT    Recommendations for Other Services       Precautions / Restrictions Precautions Precautions: Fall Restrictions RLE Weight Bearing: Non weight bearing      Mobility  Bed Mobility Overal bed mobility: Needs Assistance Bed Mobility: Supine to Sit;Sit to Supine     Supine to sit: +2 for physical assistance;Mod assist Sit to supine: +2 for physical assistance   General bed mobility comments: cues to self assist; assist with trunk and LEs  Transfers                 General transfer comment: deferred d/t pain RUE  Ambulation/Gait                Stairs            Wheelchair Mobility    Modified Rankin (Stroke Patients Only)       Balance Overall  balance assessment: Needs assistance   Sitting balance-Leahy Scale: Fair Sitting balance - Comments: pt able to to maintain static sit on EOB after requiring min to mod assist for the first 45 sec with multi-directional LOB Postural control: Posterior lean;Right lateral lean;Left lateral lean                                   Pertinent Vitals/Pain Pain Assessment: 0-10 Pain Score: 3  Pain Location: right wrist Pain Descriptors / Indicators: Dull Pain Intervention(s): Limited activity within patient's tolerance;Monitored during session;Premedicated before session    Home Living Family/patient expects to be discharged to:: Skilled nursing facility Living Arrangements: Alone                    Prior Function Level of Independence: Independent               Hand Dominance        Extremity/Trunk Assessment   Upper Extremity Assessment: RUE deficits/detail;Generalized weakness   RUE: Unable to fully assess due to pain       Lower Extremity Assessment: Generalized weakness         Communication   Communication: HOH  Cognition Arousal/Alertness: Awake/alert Behavior During Therapy: WFL for tasks assessed/performed Overall Cognitive Status: Within Functional Limits for tasks assessed  General Comments      Exercises        Assessment/Plan    PT Assessment Patient needs continued PT services  PT Diagnosis Generalized weakness;Acute pain   PT Problem List Decreased strength;Decreased activity tolerance;Decreased balance;Decreased mobility  PT Treatment Interventions DME instruction;Functional mobility training;Therapeutic activities;Therapeutic exercise;Balance training;Patient/family education   PT Goals (Current goals can be found in the Care Plan section) Acute Rehab PT Goals Patient Stated Goal: to get better PT Goal Formulation: With patient Time For Goal Achievement: 03/27/15 Potential to Achieve  Goals: Good    Frequency Min 3X/week   Barriers to discharge        Co-evaluation               End of Session   Activity Tolerance: Patient limited by pain;Patient limited by fatigue Patient left: in bed;with call bell/phone within reach;with bed alarm set;with family/visitor present           Time: [id number] PT Time Calculation (min) (ACUTE ONLY): 24 min   Charges:   PT Evaluation $Initial PT Evaluation Tier I: 1 Procedure PT Treatments $Therapeutic Activity: 8-22 mins   PT G Codes:        Ivanka Kirshner 03/25/15, 3:16 PM

## 2015-03-13 NOTE — Care Management Note (Signed)
Case Management Note  Patient Details  Name: [name] [name] MRN: [id number] Date of Birth: July 17, 1922  Subjective/Objective:     Admitted d/t acute periprosthetic fracture of the right proximal femur and per ortho this is managed non operatively.               Action/Plan: Discharge planning per CSW  Expected Discharge Date:  03/14/15               Expected Discharge Plan:  [place]  In-House Referral:  Clinical Social Work  Discharge planning Services  CM Consult  Post Acute Care Choice:  NA Choice offered to:  NA  DME Arranged:  N/A DME Agency:  NA  HH Arranged:  NA HH Agency:  NA  Status of Service:  Completed, signed off  Medicare Important Message Given:    Date Medicare IM Given:    Medicare IM give by:    Date Additional Medicare IM Given:    Additional Medicare Important Message give by:     If discussed at [place] of Stay Meetings, dates discussed:    Additional Comments:  Guadalupe Maple, RN 03/13/2015, 12:53 PM

## 2015-03-13 NOTE — Progress Notes (Signed)
CSW met with pt / daughter to assist with d/c planning. Pt / daughter have requested [facility] for [place]. Pt / daughter are aware that pt will have to pay out of pocket for rehab if pt does not require in pt hospitalization for 3 over nights. [facility] has offered a bed for pt when stable for d/c. CSW will continue to follow to assist with d/c planning to SNF.  Werner Lean LCSW [phone]

## 2015-03-13 NOTE — Evaluation (Signed)
Clinical/Bedside Swallow Evaluation Patient Details  Name: [name] [name] MRN: [id number] Date of Birth: 1922-12-04  Today's Date: 03/13/2015 Time: SLP Start Time (ACUTE ONLY): [id number] SLP Stop Time (ACUTE ONLY): 1540 SLP Time Calculation (min) (ACUTE ONLY): 35 min  Past Medical History: History reviewed. No pertinent past medical history. Past Surgical History:  Past Surgical History  Procedure Laterality Date  . Heart disease      permanent pacemaker  . Appendectomy    . Inguinal hernia repair    . Hip fracture surgery  2011    ORIF  R.  . Pacemaker insertion    . Colonoscopy  12/1998, 02/2004    diverticulosois, external hemorrhoids  . Esophagogastroduodenoscopy  06/17/2014    Dr. Oneida Alar: 1. Patent stricture at the gastroesophageal junction 2. UGIB Due to multiple  gastric ulcers 3. Moderate Duodentitis. Negative H.pylori  . Esophagogastroduodenoscopy N/A 10/17/2014    Procedure: ESOPHAGOGASTRODUODENOSCOPY (EGD);  Surgeon: Danie Binder, MD;  Location: [facility];  Service: Endoscopy;  Laterality: N/A;  1030  . Esophagogastroduodenoscopy N/A 02/10/2015    Procedure: ESOPHAGOGASTRODUODENOSCOPY (EGD);  Surgeon: Danie Binder, MD;  Location: [facility];  Service: Endoscopy;  Laterality: N/A;  115  . Craniotomy Left    HPI:  81 yo male adm to [facility] with fx of proximal end of right femur - after fall, complicated by respiratory failure - ? due to narcotics.  PMH + for CHF, COPD - h/o smoking, memory loss, diffuse large B cell lymphoma, gastric lymphoma, gastritis.  Pt has been seen by ortho and no surgical intervention recommended.  Swallow evaluation ordered. Pt denies dysphagia except to pills since he has been sick = stating he feels lke they try to go down the wrong way.  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   Prior "stricture at [place]" noted on EGD 06/17/2014.  Per daughter-in-law, pt resides alone and was independent prior to admission.        Assessment / Plan / Recommendation Clinical Impression   Pt presents with symptoms of mild oropharyngeal dysphagia *suspect mental status/lethargy contributing* on baseline esophageal deficits (known stricture 05/2014).    Fortunately pt with strong voice and no overt cranial nerve deficits impacting swallow musculature.   He is more sleepy during this session than just before 1300 when SLP initially saw him.    SLP observed pt with minimal amount of intake due to his statement of "I think I'm done for today" and recently completing lunch/PT.  Water via straw and brownie bite given to pt by SLP.  Slow but effective mastication with indication of adequate oropharyngeal clearance.   First water swallow tolerated well, however 2nd bolus followed by wet voice - prompting cues from SLP to cough with pt expectoration of viscous secretions.  ? Dis-coordination with swallow/? Residuals?.  Fortunately pt was able to adequately clear secretions!   Daughter-in-law present reports pt with occasional coughing with liquids  - and pt nor family note improvement of symptoms with Ensure/Glucerna.  Pt admits to primarily pill dysphagia- feeling like it's going "down the wrong way". He is agreeable to take medications with pudding or puree currently.    Educated pt/daughter-in-law to strategies to mitigate aspiration risk using teach back and written instructions. Reviewed increased risk of pulmonary issues given h/o smoking and current mobility status.  Pt was falling asleep near end of session.    Recommend to continue dys3/thin with strict aspiration/esophageal precautions.  Hopeful for swallow  improvement with medical recovery.  Will follow up for tolerance, family education, indication for further testing.        Aspiration Risk  Mild aspiration risk;Moderate aspiration risk    Diet Recommendation Dysphagia 3 (Mech soft);Thin liquid   Liquid Administration via: Cup;Straw Medication Administration: Whole meds  with puree (start and follow with liquids) Supervision: Staff to assist with self feeding (pt is right handed and right arm is very sore per pt) Compensations: Slow rate;Small sips/bites;Follow solids with liquid Postural Changes: Seated upright at 90 degrees;Remain upright for at least 30 minutes after po intake    Other  Recommendations Oral Care Recommendations: Oral care before and after PO   Follow up Recommendations    TBD   Frequency and Duration min 1 x/week  1 week       Prognosis Prognosis for Safe Diet Advancement: Fair      Swallow Study   General Date of Onset: 03/13/15 HPI: 81 yo male adm to [facility] with fx of proximal end of right femur - after fall, complicated by respiratory failure - ? due to narcotics.  PMH + for CHF, COPD - h/o smoking, memory loss, diffuse large B cell lymphoma, gastric lymphoma, gastritis.  Pt has been seen by ortho and no surgical intervention recommended.  Swallow evaluation ordered. Pt denies dysphagia except to pills since he has been sick = stating he feels lke they try to go down the wrong way.  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     Type of Study: Bedside Swallow Evaluation Diet Prior to this Study: Dysphagia 3 (soft);Thin liquids Temperature Spikes Noted: Yes Respiratory Status: Nasal cannula History of Recent Intubation: No Behavior/Cognition: Alert;Cooperative;Pleasant mood Oral Cavity Assessment: Within Functional Limits Oral Care Completed by SLP: No Oral Cavity - Dentition: Adequate natural dentition Vision: Functional for self-feeding Self-Feeding Abilities: Needs assist (due to right handed and right limb issues ) Patient Positioning: Upright in bed Baseline Vocal Quality: Normal Volitional Cough: Strong Volitional Swallow: Able to elicit    Oral/Motor/Sensory Function Overall Oral Motor/Sensory Function: Generalized oral weakness   Ice Chips Ice chips: Not tested   Thin Liquid Thin  Liquid: Impaired Presentation: Cup;Self Fed;Straw Pharyngeal  Phase Impairments: Wet Vocal Quality;Cough - Immediate;Multiple swallows    Nectar Thick Nectar Thick Liquid: Not tested   Honey Thick Honey Thick Liquid: Not tested   Puree Puree: Not tested   Solid Solid: Impaired Presentation: Spoon Oral Phase Impairments: Reduced lingual movement/coordination;Impaired mastication Oral Phase Functional Implications: Impaired mastication (slow mastication - ? due to weakness, exacerbated by xerostomia)       Claudie Fisherman, [place] [facility] SLP [phone]

## 2015-03-13 NOTE — Progress Notes (Signed)
PT Cancellation Note  Patient Details Name: [name] [name] MRN: [id number] DOB: 27-Sep-1922   Cancelled Treatment:    Reason Eval/Treat Not Completed: Patient not medically ready/off floor (pt with shoulder pain and swelling--per NT--off floor for xray to r/o possible fx) Attempt again as schedule permits   [place] 03/13/2015, 10:53 AM

## 2015-03-13 NOTE — Progress Notes (Signed)
Patient ID: [name] [name], male   DOB: [birth year], 81 y.o.   MRN: [id number] [facility] PROGRESS NOTE  [name] [name] [id number] DOB: 04/16/1922 DOA: 03/11/2015 PCP: Walker Kehr, MD  Brief narrative:    81 y.o. male with past medical history of memory loss, chronic combined CHF (2 D ECHO in 2014 EF 35% and grade 2 diastolic dysfunction), DLBCL (on disease progression), CKD stage 3 (baseline Cr 1.7) who presented to [facility] ED status post fall. He sustained acute periprosthetic fracture of the right proximal femur and per ortho this is managed non operatively.  While in ED, patient received 6 mg of morphine and became hypoxic but since then able to keep O2 sats above 90%. He continues to have a quite a significant pain in the right arm and right leg. We placed order for x-ray for evaluation of possible fracture in the right upper extremity but those studies were negative for acute fractures. Ultrasound of the right extremity ordered because of swelling to make sure there is no DVT. In addition, his creatinine is slightly worse since the admission. We will start a low rate IV fluid and follow-up BMP tomorrow morning.  Assessment/Plan:    Principal Problem:   Fracture of proximal end of right femur ([place]) / Acute periprosthetic fracture of the right proximal femur / right upper extremity pain - Ortho surgery consulted from ED but recommended non operative management  - Pain and not adequately controlled at this point. Change morphine to Dilaudid to see if this helps. Add low-dose Norco for breakthrough pain. - Because of the pain patient was not able to participate in physical therapy. Hopefully this can be done today for safe discharge planning. Anticipated discharge to skilled nursing facility once medically stable. - X-ray of the right upper extremity showed no acute fractures. - Obtain right UE doppler to rule out DVT  Active Problems:   Acute respiratory failure with hypoxemia -  Due to narcotics - Stable respiratory status    Hypothyroidism - Continue synthroid     Essential hypertension - Continue metoprolol    Chronic systolic and diastolic heart failure (HCC) / Automatic implantable cardioverter-defibrillator in situ - 2 D ECHO in 10/2012 with EF of 35% and grade 2 diastolic dysfunction - Compensated     Memory loss - Continue aricept    CKD (chronic kidney disease), stage 3 (HCC) - Baseline Cr 1.7 in 11/2014 - Cr slightly up 1.8. Resume IV fluids at low rate 50 mL an hour. - Follow-up BMP tomorrow morning.    Anemia in chronic renal disease - Hemoglobin stable at 11.9    Diffuse large B cell lymphoma (HCC) - The PET CT scan previously showed no evidence of active disease. - From the lymphoma standpoint, pt had stage I disease. - F/U with Dr. Alvy Bimler     Thrombocytopenia [facility]) - Likely form history of DLBCL - Platelets 141 --> 126 - Follow-up CBC tomorrow morning.    Dyslipidemia - Continue atorvastatin     Leukocytosis - Likely reactive - No evidence of acute infectious process   DVT Prophylaxis  - Continue SCDs for DVT prophylaxis  Code Status: Full.  Family Communication:  Spoke with the patient's daughter at the bedside this morning Disposition Plan: Likely to skilled nursing facility by 03/15/2015.  IV access:  Peripheral IV  Procedures and diagnostic studies:    Dg Forearm Right 03/13/2015  No fracture or dislocation. Electronically Signed   By: Sandi Mariscal M.D.   On:  03/13/2015 11:27   Dg Humerus Right 03/13/2015  No definite fracture or dislocation. Further evaluation with dedicated right elbow and shoulder radiographs could be performed as clinically indicated. Electronically Signed   By: Sandi Mariscal M.D.   On: 03/13/2015 11:26   Dg Hand Complete Right 03/13/2015  No displaced fracture or dislocation. Electronically Signed   By: Sandi Mariscal M.D.   On: 03/13/2015 11:28   Ct Head Wo Contrast 03/11/2015 No acute intracranial  abnormalities. Diffuse atrophy and small vessel ischemic changes. Postoperative left parietal craniectomy. Air-fluid levels in the maxillary antra bilaterally. Diffuse bone demineralization and degenerative change in the cervical spine. No acute displaced fractures identified. Old compression deformities at T2, T3, and T4. Electronically Signed   By: Lucienne Capers M.D.   On: 03/11/2015 22:08   Ct Cervical Spine Wo Contrast 03/11/2015 No acute intracranial abnormalities. Diffuse atrophy and small vessel ischemic changes. Postoperative left parietal craniectomy. Air-fluid levels in the maxillary antra bilaterally. Diffuse bone demineralization and degenerative change in the cervical spine. No acute displaced fractures identified. Old compression deformities at T2, T3, and T4. Electronically Signed   By: Lucienne Capers M.D.   On: 03/11/2015 22:08   Dg Chest Port 1 View 03/11/2015  1. Interstitial prominence bilaterally, similar to a chest x-ray of 10/26/2014 but significantly more prominent than on chest x-ray of 07/19/2013. This could represent worsening of a chronic interstitial thickening, however, I suspect at least some degree of acute interstitial edema related to mild volume overload. No evidence of pneumonia. 2. Stable cardiomediastinal silhouette. Electronically Signed   By: Franki Cabot M.D.   On: 03/11/2015 23:58   Dg Hip Unilat With Pelvis 2-3 Views Right 03/11/2015  1. Acute periprosthetic fracture involving the proximal right femur inches below the greater trochanter. Electronically Signed   By: Kerby Moors M.D.   On: 03/11/2015 20:52    Medical Consultants:  None   Other Consultants:  Physical therapy   IAnti-Infectives:   None    Leisa Lenz, MD  [facility] Pager [phone]  Time spent in minutes: 25 minutes  If 7PM-7AM, please contact night-coverage [email or web address] Password [email or web address] 03/13/2015, 11:49 AM   LOS: 1 day    HPI/Subjective: No acute overnight  events. Pain and swelling in right arm.  Objective: Filed Vitals:   03/12/15 1444 03/12/15 2033 03/13/15 0043 03/13/15 0545  BP: 108/45 105/52  107/52  Pulse: 62 60  56  Temp: 100.5 F (38.1 C) 98.2 F (36.8 C)  98.2 F (36.8 C)  TempSrc: Oral Oral  Oral  Resp: 16 19  18   Height:      Weight:    73.8 kg (162 lb 11.2 oz)  SpO2: 96% 96% 96% 94%    Intake/Output Summary (Last 24 hours) at 03/13/15 1149 Last data filed at 03/13/15 0600  Gross per 24 hour  Intake 2991.66 ml  Output    475 ml  Net 2516.66 ml    Exam:   General:  Pt is not in acute distress  Cardiovascular: Rate controlled, appreciate S1, S2   Respiratory: No wheezing, no crackles, no rhonchi  Abdomen: S(+) BS, non tender   Extremities: No edema, palpable pulses   Neuro: Nonfocal  Data Reviewed: Basic Metabolic Panel:  Recent Labs Lab 03/11/15 2055 03/12/15 0441  NA 136 138  K 4.8 5.1  CL 102 106  CO2 25 26  GLUCOSE 109* 106*  BUN 36* 38*  CREATININE 1.67* 1.82*  CALCIUM 8.8* 8.6*   Liver  Function Tests:  Recent Labs Lab 03/12/15 0441  ALBUMIN 3.8   No results for input(s): LIPASE, AMYLASE in the last 168 hours. No results for input(s): AMMONIA in the last 168 hours. CBC:  Recent Labs Lab 03/11/15 2055 03/12/15 0441  WBC 15.0* 10.4  NEUTROABS 11.3*  --   HGB 12.8* 11.9*  HCT 37.5* 35.3*  MCV 91.7 92.4  PLT 141* 126*   Cardiac Enzymes: No results for input(s): CKTOTAL, CKMB, CKMBINDEX, TROPONINI in the last 168 hours. BNP: Invalid input(s): POCBNP CBG: No results for input(s): GLUCAP in the last 168 hours.  No results found for this or any previous visit (from the past 240 hour(s)).   Scheduled Meds: . atorvastatin  10 mg Oral BID  . docusate sodium  100 mg Oral BID  . donepezil  10 mg Oral QHS  . levothyroxine  100 mcg Oral QAC breakfast  . metoprolol succinate  12.5 mg Oral BID  . senna  1 tablet Oral BID   Continuous Infusions: . sodium chloride 10 mL/hr at  03/12/15 1410

## 2015-03-13 NOTE — Progress Notes (Signed)
OT  Note  Patient Details Name: [name] [name] MRN: [id number] DOB: 1922/07/28   Cancelled Treatment:   Noted plans for SNF. Will Defer OT eval to SNF  [place], Thereasa Parkin 03/13/2015, 2:55 PM

## 2015-03-13 NOTE — Progress Notes (Signed)
Med error, administered 1mg  morphine IV instead of 0.5mg  by accident. Patient was in 8/10 pain, now down to 6/10. MD aware, Patient and family aware. Wasted correctly in pyxis. Will complete progress note. Charge nurse aware, and my Therapist, music aware.

## 2015-03-13 NOTE — Progress Notes (Signed)
OT Cancellation Note  Patient Details Name: [name] [name] MRN: [id number] DOB: November 01, 1922   Cancelled Treatment:    Reason Eval/Treat Not Completed: Patient not medically ready   Patient not medically ready/off floor (pt with shoulder pain and swelling--per NT--off floor for xray to r/o possible fx) Attempt again as schedule permits [place], Thereasa Parkin 03/13/2015, 11:04 AM

## 2015-03-14 ENCOUNTER — Inpatient Hospital Stay (HOSPITAL_COMMUNITY): Payer: Medicare Other

## 2015-03-14 DIAGNOSIS — R609 Edema, unspecified: Secondary | ICD-10-CM

## 2015-03-14 LAB — CBC
HEMATOCRIT: 29.3 % — AB (ref 39.0–52.0)
Hemoglobin: 10 g/dL — ABNORMAL LOW (ref 13.0–17.0)
MCH: 31.3 pg (ref 26.0–34.0)
MCHC: 34.1 g/dL (ref 30.0–36.0)
MCV: 91.6 fL (ref 78.0–100.0)
Platelets: 75 10*3/uL — ABNORMAL LOW (ref 150–400)
RBC: 3.2 MIL/uL — AB (ref 4.22–5.81)
RDW: 14.1 % (ref 11.5–15.5)
WBC: 13.5 10*3/uL — AB (ref 4.0–10.5)

## 2015-03-14 LAB — BASIC METABOLIC PANEL
ANION GAP: 8 (ref 5–15)
BUN: 47 mg/dL — AB (ref 6–20)
CO2: 23 mmol/L (ref 22–32)
Calcium: 8.2 mg/dL — ABNORMAL LOW (ref 8.9–10.3)
Chloride: 105 mmol/L (ref 101–111)
Creatinine, Ser: 2.27 mg/dL — ABNORMAL HIGH (ref 0.61–1.24)
GFR calc Af Amer: 28 mL/min — ABNORMAL LOW (ref >60)
GFR calc non Af Amer: 24 mL/min — ABNORMAL LOW (ref >60)
Glucose, Bld: 136 mg/dL — ABNORMAL HIGH (ref 65–99)
POTASSIUM: 4.8 mmol/L (ref 3.5–5.1)
Sodium: 136 mmol/L (ref 135–145)

## 2015-03-14 NOTE — Progress Notes (Signed)
*  Preliminary Results* Right upper extremity venous duplex completed. Visualized veins of the right upper extremity are negative for deep and superficial vein thrombosis.  03/14/2015 11:12 AM  Maudry Mayhew, RVT, RDCS, RDMS

## 2015-03-14 NOTE — Progress Notes (Signed)
Patient ID: [name] [name], male   DOB: 1922/11/23, 81 y.o.   MRN: [id number] [facility] PROGRESS NOTE  [name] [name] [id number] DOB: 1922-08-12 DOA: 03/11/2015 PCP: Walker Kehr, MD  Brief narrative:    81 y.o. male with past medical history of memory loss, chronic combined CHF (2 D ECHO in 2014 EF 35% and grade 2 diastolic dysfunction), DLBCL (on disease progression), CKD stage 3 (baseline Cr 1.7) who presented to [facility] ED status post fall. He sustained acute periprosthetic fracture of the right proximal femur and per ortho this is managed non operatively.  While in ED, patient received 6 mg of morphine and became hypoxic but since then able to keep O2 sats above 90%. Hospital course is complicated with ongoing inability to get up from the bed because of the pain in her right hip area. He also has pain in the right upper extremity which evaluated with x-rays and they did not show acute fractures. Orthopedics re-consulted for input on pain and fracture in the right hip area.  Assessment/Plan:    Principal Problem:   Fracture of proximal end of right femur ([place]) / Acute periprosthetic fracture of the right proximal femur / right upper extremity pain - Ortho surgery consulted from ED but recommended non operative management  - Patient still not able to get up from the bed so will call orthopedic surgery for their input - Continue pain management efforts. - In regards to right upper extremity pain, x-ray studies did not demonstrate any acute fractures - Right upper extremity Doppler did not demonstrate a DVT - Pain in right upper extremity seems to be better compared with yesterday per patient  Active Problems:   Acute respiratory failure with hypoxemia - No further issues with hypoxemia since the admission. This was likely reflective of pain medications given in ED. So far he is saturating above 90% without nasal cannula oxygen support    Hypothyroidism - Continue synthroid 100  g daily    Essential hypertension - Metoprolol held this morning because blood pressure on soft side, 96/42    Chronic systolic and diastolic heart failure (HCC) / Automatic implantable cardioverter-defibrillator in situ - 2 D ECHO in 10/2012 with EF of 35% and grade 2 diastolic dysfunction - Compensated     Memory loss - Continue aricept    CKD (chronic kidney disease), stage 3 (HCC) - Baseline Cr 1.7 in 11/2014 - Cr trended up, 2.27 this morning. He is not on any nephrotoxic medication that could potentially contribute to this worsening creatinine. - He is on low rate fluids, will continue to monitor BMP for now.    Anemia in chronic renal disease - Anemia likely secondary to combination of chronic kidney disease and history of lymphoma - Hemoglobin is 10, no reports of bleeding - Monitor ecchymosis on upper extremities    Diffuse large B cell lymphoma (HCC) - The PET CT scan previously showed no evidence of active disease. - From the lymphoma standpoint, pt had stage I disease. - F/U with Dr. Alvy Bimler     Thrombocytopenia [facility]) - Likely form history of DLBCL - Platelets trending down, no reports of bleeding - Platelets are 75    Dyslipidemia - Continue atorvastatin     Leukocytosis - Likely reactive - No evidence of acute infectious process   DVT Prophylaxis  - Continue SCDs bilaterally  Code Status: Full.  Family Communication:  Spoke with the patient's daughter at the bedside this morning Disposition Plan: Once we can get  patient to per dissipate in physical therapy for evaluation that we could make a determination when he is ready for discharge to skilled nursing facility. At this time he's not able to get up from the bed at all to allow such evaluation.  IV access:  Peripheral IV  Procedures and diagnostic studies:    Dg Forearm Right 03/13/2015  No fracture or dislocation. Electronically Signed   By: Sandi Mariscal M.D.   On: 03/13/2015 11:27   Dg Humerus  Right 03/13/2015  No definite fracture or dislocation. Further evaluation with dedicated right elbow and shoulder radiographs could be performed as clinically indicated. Electronically Signed   By: Sandi Mariscal M.D.   On: 03/13/2015 11:26   Dg Hand Complete Right 03/13/2015  No displaced fracture or dislocation. Electronically Signed   By: Sandi Mariscal M.D.   On: 03/13/2015 11:28   Ct Head Wo Contrast 03/11/2015 No acute intracranial abnormalities. Diffuse atrophy and small vessel ischemic changes. Postoperative left parietal craniectomy. Air-fluid levels in the maxillary antra bilaterally. Diffuse bone demineralization and degenerative change in the cervical spine. No acute displaced fractures identified. Old compression deformities at T2, T3, and T4. Electronically Signed   By: Lucienne Capers M.D.   On: 03/11/2015 22:08   Ct Cervical Spine Wo Contrast 03/11/2015 No acute intracranial abnormalities. Diffuse atrophy and small vessel ischemic changes. Postoperative left parietal craniectomy. Air-fluid levels in the maxillary antra bilaterally. Diffuse bone demineralization and degenerative change in the cervical spine. No acute displaced fractures identified. Old compression deformities at T2, T3, and T4. Electronically Signed   By: Lucienne Capers M.D.   On: 03/11/2015 22:08   Dg Chest Port 1 View 03/11/2015  1. Interstitial prominence bilaterally, similar to a chest x-ray of 10/26/2014 but significantly more prominent than on chest x-ray of 07/19/2013. This could represent worsening of a chronic interstitial thickening, however, I suspect at least some degree of acute interstitial edema related to mild volume overload. No evidence of pneumonia. 2. Stable cardiomediastinal silhouette. Electronically Signed   By: Franki Cabot M.D.   On: 03/11/2015 23:58   Dg Hip Unilat With Pelvis 2-3 Views Right 03/11/2015  1. Acute periprosthetic fracture involving the proximal right femur inches below the greater  trochanter. Electronically Signed   By: Kerby Moors M.D.   On: 03/11/2015 20:52    Medical Consultants:  Orthopedic surgery  Other Consultants:  Physical therapy   IAnti-Infectives:   None    Leisa Lenz, MD  [facility] Pager [phone]  Time spent in minutes: 25 minutes  If 7PM-7AM, please contact night-coverage [email or web address] Password [email or web address] 03/14/2015, 11:21 AM   LOS: 2 days    HPI/Subjective: No acute overnight events. Pain seems to be better per patient  Objective: Filed Vitals:   03/13/15 2200 03/14/15 0553 03/14/15 0955 03/14/15 1004  BP: 116/51 109/47 101/40 96/42  Pulse: 63 60    Temp: 98.2 F (36.8 C) 97.5 F (36.4 C)    TempSrc: Oral Oral    Resp: 16 16    Height:      Weight:      SpO2: 93% 97%      Intake/Output Summary (Last 24 hours) at 03/14/15 1121 Last data filed at 03/14/15 0730  Gross per 24 hour  Intake    840 ml  Output    477 ml  Net    363 ml    Exam:   General:  Pt is alert, awake  Cardiovascular: RRR, appreciate S1-S2  Respiratory:  bilateral air entry, no wheezing or rhonchi  Abdomen: Nontender abdomen, appreciate bowel sounds  Extremities: Patient has multiple ecchymosis on bilateral upper extremities, right upper extremity less swollen since yesterday, has more movement since yesterday  Neuro: No focal deficits  Data Reviewed: Basic Metabolic Panel:  Recent Labs Lab 03/11/15 2055 03/12/15 0441 03/13/15 1215 03/14/15 0438  NA 136 138 134* 136  K 4.8 5.1 4.7 4.8  CL 102 106 102 105  CO2 25 26 24 23   GLUCOSE 109* 106* 133* 136*  BUN 36* 38* 42* 47*  CREATININE 1.67* 1.82* 2.06* 2.27*  CALCIUM 8.8* 8.6* 8.2* 8.2*   Liver Function Tests:  Recent Labs Lab 03/12/15 0441  ALBUMIN 3.8   No results for input(s): LIPASE, AMYLASE in the last 168 hours. No results for input(s): AMMONIA in the last 168 hours. CBC:  Recent Labs Lab 03/11/15 2055 03/12/15 0441 03/13/15 1215 03/14/15 0438  WBC  15.0* 10.4 12.1* 13.5*  NEUTROABS 11.3*  --   --   --   HGB 12.8* 11.9* 10.8* 10.0*  HCT 37.5* 35.3* 32.3* 29.3*  MCV 91.7 92.4 92.6 91.6  PLT 141* 126* 95* 75*   Cardiac Enzymes: No results for input(s): CKTOTAL, CKMB, CKMBINDEX, TROPONINI in the last 168 hours. BNP: Invalid input(s): POCBNP CBG: No results for input(s): GLUCAP in the last 168 hours.  No results found for this or any previous visit (from the past 240 hour(s)).   Scheduled Meds: . atorvastatin  10 mg Oral BID  . docusate sodium  100 mg Oral BID  . donepezil  10 mg Oral QHS  . levothyroxine  100 mcg Oral QAC breakfast  . metoprolol succinate  12.5 mg Oral BID  . senna  1 tablet Oral BID   Continuous Infusions: . sodium chloride 50 mL/hr at 03/13/15 1143

## 2015-03-15 LAB — BASIC METABOLIC PANEL
Anion gap: 7 (ref 5–15)
BUN: 41 mg/dL — AB (ref 6–20)
CHLORIDE: 106 mmol/L (ref 101–111)
CO2: 23 mmol/L (ref 22–32)
CREATININE: 1.64 mg/dL — AB (ref 0.61–1.24)
Calcium: 7.9 mg/dL — ABNORMAL LOW (ref 8.9–10.3)
GFR calc Af Amer: 41 mL/min — ABNORMAL LOW (ref >60)
GFR, EST NON AFRICAN AMERICAN: 35 mL/min — AB (ref >60)
GLUCOSE: 108 mg/dL — AB (ref 65–99)
POTASSIUM: 4.3 mmol/L (ref 3.5–5.1)
SODIUM: 136 mmol/L (ref 135–145)

## 2015-03-15 LAB — CBC
HCT: 30.4 % — ABNORMAL LOW (ref 39.0–52.0)
Hemoglobin: 10.3 g/dL — ABNORMAL LOW (ref 13.0–17.0)
MCH: 31.3 pg (ref 26.0–34.0)
MCHC: 33.9 g/dL (ref 30.0–36.0)
MCV: 92.4 fL (ref 78.0–100.0)
PLATELETS: 93 10*3/uL — AB (ref 150–400)
RBC: 3.29 MIL/uL — AB (ref 4.22–5.81)
RDW: 14.2 % (ref 11.5–15.5)
WBC: 10.9 10*3/uL — ABNORMAL HIGH (ref 4.0–10.5)

## 2015-03-15 NOTE — Progress Notes (Signed)
CSW has confirmed SNF bed for patient at [facility] which is their preference- MD advises probable dc for tomorrow- SNF aware- CSW attempting to reach family to update.   Eduard Clos, MSW, [place]

## 2015-03-15 NOTE — Consult Note (Signed)
   [name] Inpatient Consult   03/15/2015  LACOREY NADEEM 1923/03/15 [id number]   Patient evaluated for [name] [name] services. Noted discharge plan is for SNF. Will not engage for [facility] services at this time.    Marthenia Rolling, MSN-Ed, RN,BSN [facility] Liaison [phone]

## 2015-03-15 NOTE — Progress Notes (Signed)
Patient ID: [name] [name], male   DOB: Aug 12, 1922, 81 y.o.   MRN: [id number] [facility] PROGRESS NOTE  RIYADH MAZANEC [id number] DOB: Aug 13, 1922 DOA: 03/11/2015 PCP: Walker Kehr, MD  Brief narrative:    81 y.o. male with past medical history of memory loss, chronic combined CHF (2 D ECHO in 2014 EF 35% and grade 2 diastolic dysfunction), DLBCL (on disease progression), CKD stage 3 (baseline Cr 1.7) who presented to [facility] ED status post fall. He sustained acute periprosthetic fracture of the right proximal femur and per ortho this is managed non operatively.  While in ED, patient received 6 mg of morphine and became hypoxic but since then able to keep O2 sats above 90%. Hospital course is complicated with ongoing inability to get up from the bed because of the pain in her right hip area. He also has pain in the right upper extremity which evaluated with x-rays and they did not show acute fractures. Orthopedics re-consulted for input on pain and fracture in the right hip area.  Assessment/Plan:    Principal Problem:   Fracture of proximal end of right femur ([place]) / Acute periprosthetic fracture of the right proximal femur / right upper extremity pain - Ortho surgery consulted from ED but recommended non operative management  - Continue pain management efforts. - PT if able to participate  - In regards to right upper extremity pain, no acute fractures on x-ray studies - RUE doppler negative for DVT  Active Problems:   Acute respiratory failure with hypoxemia - No further issues with hypoxemia since the admission. It was likely due to pain medications given in ED. So far he is saturating above 90% without nasal cannula oxygen support    Hypothyroidism - Continue synthroid 100 g daily    Essential hypertension - BP 140/67 - Continue metoprolol     Chronic systolic and diastolic heart failure (HCC) / Automatic implantable cardioverter-defibrillator in situ - 2 D ECHO in  10/2012 with EF of 35% and grade 2 diastolic dysfunction - Compensated  - Stable respiratory status     Memory loss - Continue aricept - Better mental status     CKD (chronic kidney disease), stage 3 (HCC) - Baseline Cr 1.7 in 11/2014 - Cr trended up, 2.27; check BMP today to see if Cr improved with fluids    Anemia in chronic renal disease - Secondary to combination of chronic kidney disease and history of lymphoma - Check CBC today - Stable hgb otherwise     Diffuse large B cell lymphoma ([place]) - The PET CT scan previously showed no evidence of active disease. - From the lymphoma standpoint, pt had stage I disease. - Follows with Dr. Alvy Bimler     Thrombocytopenia ([place]) - Likely form history of DLBCL - Platelets down to 75 - Check CBC today     Dyslipidemia - Continue atorvastatin     Leukocytosis - No evidence of acute infectious process  - Check CBC today   DVT Prophylaxis  - Continue SCDs bilaterally in hospital   Code Status: Full.  Family Communication:  Spoke with the patient's daughter at the bedside this morning Disposition Plan: to SNF tomorrow 12/22  IV access:  Peripheral IV  Procedures and diagnostic studies:    UE vascular doppler 03/14/2015 - no DVT  Dg Forearm Right 03/13/2015  No fracture or dislocation. Electronically Signed   By: Sandi Mariscal M.D.   On: 03/13/2015 11:27   Dg Humerus Right 03/13/2015  No  definite fracture or dislocation. Further evaluation with dedicated right elbow and shoulder radiographs could be performed as clinically indicated. Electronically Signed   By: Sandi Mariscal M.D.   On: 03/13/2015 11:26   Dg Hand Complete Right 03/13/2015  No displaced fracture or dislocation. Electronically Signed   By: Sandi Mariscal M.D.   On: 03/13/2015 11:28   Ct Head Wo Contrast 03/11/2015 No acute intracranial abnormalities. Diffuse atrophy and small vessel ischemic changes. Postoperative left parietal craniectomy. Air-fluid levels in the maxillary  antra bilaterally. Diffuse bone demineralization and degenerative change in the cervical spine. No acute displaced fractures identified. Old compression deformities at T2, T3, and T4. Electronically Signed   By: Lucienne Capers M.D.   On: 03/11/2015 22:08   Ct Cervical Spine Wo Contrast 03/11/2015 No acute intracranial abnormalities. Diffuse atrophy and small vessel ischemic changes. Postoperative left parietal craniectomy. Air-fluid levels in the maxillary antra bilaterally. Diffuse bone demineralization and degenerative change in the cervical spine. No acute displaced fractures identified. Old compression deformities at T2, T3, and T4. Electronically Signed   By: Lucienne Capers M.D.   On: 03/11/2015 22:08   Dg Chest Port 1 View 03/11/2015  1. Interstitial prominence bilaterally, similar to a chest x-ray of 10/26/2014 but significantly more prominent than on chest x-ray of 07/19/2013. This could represent worsening of a chronic interstitial thickening, however, I suspect at least some degree of acute interstitial edema related to mild volume overload. No evidence of pneumonia. 2. Stable cardiomediastinal silhouette. Electronically Signed   By: Franki Cabot M.D.   On: 03/11/2015 23:58   Dg Hip Unilat With Pelvis 2-3 Views Right 03/11/2015  1. Acute periprosthetic fracture involving the proximal right femur inches below the greater trochanter. Electronically Signed   By: Kerby Moors M.D.   On: 03/11/2015 20:52    Medical Consultants:  Orthopedic surgery  Other Consultants:  Physical therapy   IAnti-Infectives:   None    Leisa Lenz, MD  [facility] Pager [phone]  Time spent in minutes: 25 minutes  If 7PM-7AM, please contact night-coverage [email or web address] Password [email or web address] 03/15/2015, 11:14 AM   LOS: 3 days    HPI/Subjective: No acute overnight events. Feels better this am.  Objective: Filed Vitals:   03/15/15 0134 03/15/15 0556 03/15/15 0912 03/15/15 0944  BP: 107/39  131/68  140/67  Pulse: 62 73  72  Temp: 97.8 F (36.6 C) 98.4 F (36.9 C)  97.6 F (36.4 C)  TempSrc: Oral Oral  Oral  Resp: 17 16  16   Height:      Weight:      SpO2: 98% 100% 96% 99%    Intake/Output Summary (Last 24 hours) at 03/15/15 1114 Last data filed at 03/15/15 1000  Gross per 24 hour  Intake   1600 ml  Output    300 ml  Net   1300 ml    Exam:   General:  Pt is alert, awake, no acute distress  Cardiovascular: Rate controlled, (+) S1, S2  Respiratory: no wheezing, no rhonchi   Abdomen: (+) BS, non tender   Extremities: UE (+) ecchymosis, less pain and swelling in RUE  Neuro: Nonfocal   Data Reviewed: Basic Metabolic Panel:  Recent Labs Lab 03/11/15 2055 03/12/15 0441 03/13/15 1215 03/14/15 0438  NA 136 138 134* 136  K 4.8 5.1 4.7 4.8  CL 102 106 102 105  CO2 25 26 24 23   GLUCOSE 109* 106* 133* 136*  BUN 36* 38* 42* 47*  CREATININE 1.67* 1.82*  2.06* 2.27*  CALCIUM 8.8* 8.6* 8.2* 8.2*   Liver Function Tests:  Recent Labs Lab 03/12/15 0441  ALBUMIN 3.8   No results for input(s): LIPASE, AMYLASE in the last 168 hours. No results for input(s): AMMONIA in the last 168 hours. CBC:  Recent Labs Lab 03/11/15 2055 03/12/15 0441 03/13/15 1215 03/14/15 0438  WBC 15.0* 10.4 12.1* 13.5*  NEUTROABS 11.3*  --   --   --   HGB 12.8* 11.9* 10.8* 10.0*  HCT 37.5* 35.3* 32.3* 29.3*  MCV 91.7 92.4 92.6 91.6  PLT 141* 126* 95* 75*   Cardiac Enzymes: No results for input(s): CKTOTAL, CKMB, CKMBINDEX, TROPONINI in the last 168 hours. BNP: Invalid input(s): POCBNP CBG: No results for input(s): GLUCAP in the last 168 hours.  No results found for this or any previous visit (from the past 240 hour(s)).   Scheduled Meds: . atorvastatin  10 mg Oral BID  . docusate sodium  100 mg Oral BID  . donepezil  10 mg Oral QHS  . levothyroxine  100 mcg Oral QAC breakfast  . metoprolol succinate  12.5 mg Oral BID  . senna  1 tablet Oral BID   Continuous  Infusions: . sodium chloride 50 mL/hr at 03/14/15 1851

## 2015-03-15 NOTE — Progress Notes (Signed)
Physical Therapy Treatment Patient Details Name: RAESEAN SEELEY MRN: [id number] DOB: [birth year] Today's Date: 03/15/2015    History of Present Illness 81 yo male adm 03/11/15  after fall resulting in R periprosthetic hip fx (non-operative management  per ortho); RUE xrays neg for fx; PMHx:  of Hypertension; Hypothyroid; CAD (coronary artery disease); Osteoarthritis; BPH (benign prostatic hypertrophy); Osteoporosis; Renal insufficiency; Hip fx ([place]); Sternum fx (2011); Urinary incontinence; Hearing loss; Seizures ; Cancer ; Diffuse large B cell lymphoma  ; Presence of permanent cardiac pacemaker; and COPD (chronic obstructive pulmonary disease) ([place]).     PT Comments    Patient practiced x 4 standing at a RW. Weight is shifted  Onto L leg. There are not recommendations documented for weightbearing on the R leg per orthopedic. Will continue NWB, Patient was able to use R  arm more today.  Follow Up Recommendations  SNF     Equipment Recommendations  None recommended by PT    Recommendations for Other Services       Precautions / Restrictions Precautions Precautions: Fall Precaution Comments: RUE not very functional, has abrasion R elbow. Restrictions Other Position/Activity Restrictions: Per ortho consult in ED, Dr. Gladstone Lighter recommended trial of ambulation with RW, there are no WB instructions from ortho MD. Hospitalist wrote NWB    Mobility  Bed Mobility   Bed Mobility: Supine to Sit;Sit to Supine           General bed mobility comments: cues to self assist; assist with trunk and LEs, extra time , R leg is stiff.  Transfers Overall transfer level: Needs assistance Equipment used: Rolling walker (2 wheeled) Transfers: Sit to/from Stand Sit to Stand: +2 physical assistance;+2 safety/equipment;From elevated surface;Mod assist         General transfer comment: x 4 trials  of standing. $th trial much improved and stood more erect, minimal weight on the R leg, used RW,  abkle  to bear weight on the right  arm at the walker.`  Ambulation/Gait                 Stairs            Wheelchair Mobility    Modified Rankin (Stroke Patients Only)       Balance       Sitting balance - Comments: pt able to to maintain static sit on EOB initially and throughout.                             Cognition Arousal/Alertness: Awake/alert                          Exercises General Exercises - Lower Extremity Long Arc Quad: AROM;Both;10 reps;Seated    General Comments        Pertinent Vitals/Pain Pain Score: 5  Pain Location: R shoulder and R  hip Pain Intervention(s): Limited activity within patient's tolerance;Monitored during session;Repositioned;RN gave pain meds during session    Home Living                      Prior Function            PT Goals (current goals can now be found in the care plan section) Progress towards PT goals: Progressing toward goals    Frequency  Min 3X/week    PT Plan Current plan remains appropriate    Co-evaluation  End of Session Equipment Utilized During Treatment: Gait belt Activity Tolerance: Patient tolerated treatment well Patient left: in bed;with call bell/phone within reach;with bed alarm set;with family/visitor present     Time: [id number] PT Time Calculation (min) (ACUTE ONLY): 29 min  Charges:  $Therapeutic Activity: 23-37 mins                    G Codes:      Marcelino Freestone PT [id number]  03/15/2015, 4:01 PM

## 2015-03-16 ENCOUNTER — Inpatient Hospital Stay (HOSPITAL_COMMUNITY): Payer: Medicare Other

## 2015-03-16 ENCOUNTER — Inpatient Hospital Stay
Admission: RE | Admit: 2015-03-16 | Discharge: 2015-06-01 | Disposition: A | Discharge status: Home / Self Care | Payer: Medicare Other | Source: Ambulatory Visit | Attending: Internal Medicine | Admitting: Internal Medicine

## 2015-03-16 DIAGNOSIS — S72001S Fracture of unspecified part of neck of right femur, sequela: Secondary | ICD-10-CM | POA: Diagnosis not present

## 2015-03-16 DIAGNOSIS — S72044A Nondisplaced fracture of base of neck of right femur, initial encounter for closed fracture: Secondary | ICD-10-CM | POA: Diagnosis not present

## 2015-03-16 DIAGNOSIS — S32591D Other specified fracture of right pubis, subsequent encounter for fracture with routine healing: Secondary | ICD-10-CM | POA: Diagnosis not present

## 2015-03-16 DIAGNOSIS — I5042 Chronic combined systolic (congestive) and diastolic (congestive) heart failure: Secondary | ICD-10-CM | POA: Diagnosis not present

## 2015-03-16 DIAGNOSIS — D631 Anemia in chronic kidney disease: Secondary | ICD-10-CM | POA: Diagnosis not present

## 2015-03-16 DIAGNOSIS — S72111A Displaced fracture of greater trochanter of right femur, initial encounter for closed fracture: Secondary | ICD-10-CM | POA: Diagnosis not present

## 2015-03-16 DIAGNOSIS — I130 Hypertensive heart and chronic kidney disease with heart failure and stage 1 through stage 4 chronic kidney disease, or unspecified chronic kidney disease: Secondary | ICD-10-CM | POA: Diagnosis not present

## 2015-03-16 DIAGNOSIS — R41841 Cognitive communication deficit: Secondary | ICD-10-CM | POA: Diagnosis not present

## 2015-03-16 DIAGNOSIS — Z87891 Personal history of nicotine dependence: Secondary | ICD-10-CM | POA: Diagnosis not present

## 2015-03-16 DIAGNOSIS — Z95810 Presence of automatic (implantable) cardiac defibrillator: Secondary | ICD-10-CM | POA: Diagnosis not present

## 2015-03-16 DIAGNOSIS — R059 Cough, unspecified: Principal | ICD-10-CM

## 2015-03-16 DIAGNOSIS — I255 Ischemic cardiomyopathy: Secondary | ICD-10-CM | POA: Diagnosis not present

## 2015-03-16 DIAGNOSIS — N178 Other acute kidney failure: Secondary | ICD-10-CM | POA: Diagnosis not present

## 2015-03-16 DIAGNOSIS — Z4789 Encounter for other orthopedic aftercare: Secondary | ICD-10-CM | POA: Diagnosis not present

## 2015-03-16 DIAGNOSIS — S72001A Fracture of unspecified part of neck of right femur, initial encounter for closed fracture: Secondary | ICD-10-CM | POA: Diagnosis not present

## 2015-03-16 DIAGNOSIS — S72001D Fracture of unspecified part of neck of right femur, subsequent encounter for closed fracture with routine healing: Secondary | ICD-10-CM

## 2015-03-16 DIAGNOSIS — S72111D Displaced fracture of greater trochanter of right femur, subsequent encounter for closed fracture with routine healing: Secondary | ICD-10-CM | POA: Diagnosis not present

## 2015-03-16 DIAGNOSIS — J9601 Acute respiratory failure with hypoxia: Secondary | ICD-10-CM | POA: Diagnosis not present

## 2015-03-16 DIAGNOSIS — Z9181 History of falling: Secondary | ICD-10-CM | POA: Diagnosis not present

## 2015-03-16 DIAGNOSIS — M6281 Muscle weakness (generalized): Secondary | ICD-10-CM | POA: Diagnosis not present

## 2015-03-16 DIAGNOSIS — R609 Edema, unspecified: Secondary | ICD-10-CM | POA: Diagnosis not present

## 2015-03-16 DIAGNOSIS — E785 Hyperlipidemia, unspecified: Secondary | ICD-10-CM | POA: Diagnosis not present

## 2015-03-16 DIAGNOSIS — M9701XA Periprosthetic fracture around internal prosthetic right hip joint, initial encounter: Secondary | ICD-10-CM | POA: Diagnosis not present

## 2015-03-16 DIAGNOSIS — R262 Difficulty in walking, not elsewhere classified: Secondary | ICD-10-CM | POA: Diagnosis not present

## 2015-03-16 DIAGNOSIS — R509 Fever, unspecified: Secondary | ICD-10-CM | POA: Diagnosis not present

## 2015-03-16 DIAGNOSIS — C8330 Diffuse large B-cell lymphoma, unspecified site: Secondary | ICD-10-CM | POA: Diagnosis not present

## 2015-03-16 DIAGNOSIS — I129 Hypertensive chronic kidney disease with stage 1 through stage 4 chronic kidney disease, or unspecified chronic kidney disease: Secondary | ICD-10-CM | POA: Diagnosis not present

## 2015-03-16 DIAGNOSIS — E890 Postprocedural hypothyroidism: Secondary | ICD-10-CM | POA: Diagnosis not present

## 2015-03-16 DIAGNOSIS — M79604 Pain in right leg: Secondary | ICD-10-CM | POA: Diagnosis not present

## 2015-03-16 DIAGNOSIS — N10 Acute tubulo-interstitial nephritis: Secondary | ICD-10-CM | POA: Diagnosis not present

## 2015-03-16 DIAGNOSIS — R05 Cough: Secondary | ICD-10-CM | POA: Diagnosis not present

## 2015-03-16 DIAGNOSIS — R197 Diarrhea, unspecified: Secondary | ICD-10-CM | POA: Diagnosis not present

## 2015-03-16 DIAGNOSIS — N390 Urinary tract infection, site not specified: Secondary | ICD-10-CM | POA: Diagnosis not present

## 2015-03-16 DIAGNOSIS — N189 Chronic kidney disease, unspecified: Secondary | ICD-10-CM | POA: Diagnosis not present

## 2015-03-16 DIAGNOSIS — Z8719 Personal history of other diseases of the digestive system: Secondary | ICD-10-CM | POA: Diagnosis not present

## 2015-03-16 DIAGNOSIS — D72829 Elevated white blood cell count, unspecified: Secondary | ICD-10-CM | POA: Diagnosis not present

## 2015-03-16 DIAGNOSIS — E039 Hypothyroidism, unspecified: Secondary | ICD-10-CM | POA: Diagnosis not present

## 2015-03-16 DIAGNOSIS — M7989 Other specified soft tissue disorders: Secondary | ICD-10-CM | POA: Diagnosis not present

## 2015-03-16 DIAGNOSIS — S72141G Displaced intertrochanteric fracture of right femur, subsequent encounter for closed fracture with delayed healing: Secondary | ICD-10-CM | POA: Diagnosis not present

## 2015-03-16 DIAGNOSIS — J849 Interstitial pulmonary disease, unspecified: Secondary | ICD-10-CM | POA: Diagnosis not present

## 2015-03-16 DIAGNOSIS — I5022 Chronic systolic (congestive) heart failure: Secondary | ICD-10-CM | POA: Diagnosis not present

## 2015-03-16 DIAGNOSIS — S72141D Displaced intertrochanteric fracture of right femur, subsequent encounter for closed fracture with routine healing: Secondary | ICD-10-CM | POA: Diagnosis not present

## 2015-03-16 DIAGNOSIS — R278 Other lack of coordination: Secondary | ICD-10-CM | POA: Diagnosis not present

## 2015-03-16 DIAGNOSIS — N183 Chronic kidney disease, stage 3 (moderate): Secondary | ICD-10-CM | POA: Diagnosis not present

## 2015-03-16 DIAGNOSIS — J690 Pneumonitis due to inhalation of food and vomit: Secondary | ICD-10-CM

## 2015-03-16 DIAGNOSIS — A047 Enterocolitis due to Clostridium difficile: Secondary | ICD-10-CM | POA: Diagnosis not present

## 2015-03-16 DIAGNOSIS — I10 Essential (primary) hypertension: Secondary | ICD-10-CM | POA: Diagnosis not present

## 2015-03-16 DIAGNOSIS — Z8572 Personal history of non-Hodgkin lymphomas: Secondary | ICD-10-CM | POA: Diagnosis not present

## 2015-03-16 DIAGNOSIS — R600 Localized edema: Secondary | ICD-10-CM | POA: Diagnosis not present

## 2015-03-16 DIAGNOSIS — K30 Functional dyspepsia: Secondary | ICD-10-CM | POA: Diagnosis not present

## 2015-03-16 DIAGNOSIS — I5043 Acute on chronic combined systolic (congestive) and diastolic (congestive) heart failure: Secondary | ICD-10-CM | POA: Diagnosis not present

## 2015-03-16 DIAGNOSIS — D72821 Monocytosis (symptomatic): Secondary | ICD-10-CM | POA: Diagnosis not present

## 2015-03-16 MED ORDER — DOCUSATE SODIUM 100 MG PO CAPS: 100.0000 mg | ORAL_CAPSULE | Freq: Every day | ORAL | Status: DC | PRN

## 2015-03-16 MED ORDER — METHOCARBAMOL 500 MG PO TABS: 500.0000 mg | ORAL_TABLET | Freq: Four times a day (QID) | ORAL | Status: DC | PRN

## 2015-03-16 NOTE — Clinical Social Work Placement (Signed)
   CLINICAL SOCIAL WORK PLACEMENT  NOTE  Date:  03/16/2015  Patient Details  Name: [name] [name] MRN: [id number] Date of Birth: 1922-09-22  Clinical Social Work is seeking post-discharge placement for this patient at the [place] level of care (*CSW will initial, date and re-position this form in  chart as items are completed):  Yes   Patient/family provided with [place] Work Department's list of facilities offering this level of care within the geographic area requested by the patient (or if unable, by the patient's family).  Yes   Patient/family informed of their freedom to choose among providers that offer the needed level of care, that participate in Medicare, Medicaid or managed care program needed by the patient, have an available bed and are willing to accept the patient.  Yes   Patient/family informed of [facility]'s ownership interest in [facility] and [facility], as well as of the fact that they are under no obligation to receive care at these facilities.  PASRR submitted to EDS on       PASRR number received on       Existing PASRR number confirmed on 03/12/15     FL2 transmitted to all facilities in geographic area requested by pt/family on 03/12/15     FL2 transmitted to all facilities within larger geographic area on       Patient informed that his/her managed care company has contracts with or will negotiate with certain facilities, including the following:        Yes   Patient/family informed of bed offers received.  Patient chooses bed at [facility]     Physician recommends and patient chooses bed at      Patient to be transferred to [facility] on 03/16/15.  Patient to be transferred to facility by ambulance [facility]y Harold)     Patient family notified on 03/16/15 of transfer.  Name of family member notified:  pt daughter, Ivin Booty notified     PHYSICIAN Please sign FL2     Additional Comment:     _______________________________________________ Ladell Pier, LCSW 03/16/2015, 12:20 PM

## 2015-03-16 NOTE — Progress Notes (Signed)
Speech Language Pathology Treatment: Dysphagia  Patient Details Name: [name] [name] MRN: [id number] DOB: 06-15-1922 Today's Date: 03/16/2015 Time: 0813-0822 SLP Time Calculation (min) (ACUTE ONLY): 9 min  Assessment / Plan / Recommendation Clinical Impression  Pt alert, reported no difficulties with swallow since initial evaluation (12/19) and that pills whole in applesauce is "going well". Observed RN administering pills with applesauce without oral or pharyngeal deficits. Straw sips thin WFL's. Recommend continue soft texture due to COPD and possible SOB. Reviewed esophageal precautions due to dx of esophageal stricture at GE junction. No need for further ST.   HPI HPI: 81 yo male adm to [facility] with fx of proximal end of right femur - after fall, complicated by respiratory failure - ? due to narcotics.  PMH + for CHF, COPD - h/o smoking, memory loss, diffuse large B cell lymphoma, gastric lymphoma, gastritis.  Pt has been seen by ortho and no surgical intervention recommended.  Swallow evaluation ordered. Pt denies dysphagia except to pills since he has been sick = stating he feels lke they try to go down the wrong way.  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          SLP Plan  Discharge SLP treatment due to (comment);All goals met     Recommendations  Diet recommendations: Thin liquid (soft due to COPD) Liquids provided via: Straw;Cup Medication Administration: Whole meds with puree Supervision: Patient able to self feed;Intermittent supervision to cue for compensatory strategies Compensations: Slow rate;Small sips/bites;Follow solids with liquid Postural Changes and/or Swallow Maneuvers: Seated upright 90 degrees              Oral Care Recommendations: Oral care BID Follow up Recommendations: None Plan: Discharge SLP treatment due to (comment);All goals met   Houston Siren 03/16/2015, 9:26 AM   Orbie Pyo Colvin Caroli.Ed  [employer] [phone]

## 2015-03-16 NOTE — Discharge Instructions (Signed)
Fall Prevention in the Home  Falls can cause injuries and can affect people from all age groups. There are many simple things that you can do to make your home safe and to help prevent falls. WHAT CAN I DO ON THE OUTSIDE OF MY HOME?  Regularly repair the edges of walkways and driveways and fix any cracks.  Remove high doorway thresholds.  Trim any shrubbery on the main path into your home.  Use bright outdoor lighting.  Clear walkways of debris and clutter, including tools and rocks.  Regularly check that handrails are securely fastened and in good repair. Both sides of any steps should have handrails.  Install guardrails along the edges of any raised decks or porches.  Have leaves, snow, and ice cleared regularly.  Use sand or salt on walkways during winter months.  In the garage, clean up any spills right away, including grease or oil spills. WHAT CAN I DO IN THE BATHROOM?  Use night lights.  Install grab bars by the toilet and in the tub and shower. Do not use towel bars as grab bars.  Use non-skid mats or decals on the floor of the tub or shower.  If you need to sit down while you are in the shower, use a plastic, non-slip stool..  Keep the floor dry. Immediately clean up any water that spills on the floor.  Remove soap buildup in the tub or shower on a regular basis.  Attach bath mats securely with double-sided non-slip rug tape.  Remove throw rugs and other tripping hazards from the floor. WHAT CAN I DO IN THE BEDROOM?  Use night lights.  Make sure that a bedside light is easy to reach.  Do not use oversized bedding that drapes onto the floor.  Have a firm chair that has side arms to use for getting dressed.  Remove throw rugs and other tripping hazards from the floor. WHAT CAN I DO IN THE KITCHEN?   Clean up any spills right away.  Avoid walking on wet floors.  Place frequently used items in easy-to-reach places.  If you need to reach for something  above you, use a sturdy step stool that has a grab bar.  Keep electrical cables out of the way.  Do not use floor polish or wax that makes floors slippery. If you have to use wax, make sure that it is non-skid floor wax.  Remove throw rugs and other tripping hazards from the floor. WHAT CAN I DO IN THE STAIRWAYS?  Do not leave any items on the stairs.  Make sure that there are handrails on both sides of the stairs. Fix handrails that are broken or loose. Make sure that handrails are as long as the stairways.  Check any carpeting to make sure that it is firmly attached to the stairs. Fix any carpet that is loose or worn.  Avoid having throw rugs at the top or bottom of stairways, or secure the rugs with carpet tape to prevent them from moving.  Make sure that you have a light switch at the top of the stairs and the bottom of the stairs. If you do not have them, have them installed. WHAT ARE SOME OTHER FALL PREVENTION TIPS?  Wear closed-toe shoes that fit well and support your feet. Wear shoes that have rubber soles or low heels.  When you use a stepladder, make sure that it is completely opened and that the sides are firmly locked. Have someone hold the ladder while you   are using it. Do not climb a closed stepladder.  Add color or contrast paint or tape to grab bars and handrails in your home. Place contrasting color strips on the first and last steps.  Use mobility aids as needed, such as canes, walkers, scooters, and crutches.  Turn on lights if it is dark. Replace any light bulbs that burn out.  Set up furniture so that there are clear paths. Keep the furniture in the same spot.  Fix any uneven floor surfaces.  Choose a carpet design that does not hide the edge of steps of a stairway.  Be aware of any and all pets.  Review your medicines with your healthcare provider. Some medicines can cause dizziness or changes in blood pressure, which increase your risk of falling. Talk  with your health care provider about other ways that you can decrease your risk of falls. This may include working with a physical therapist or trainer to improve your strength, balance, and endurance.   This information is not intended to replace advice given to you by your health care provider. Make sure you discuss any questions you have with your health care provider.   Document Released: 03/01/2002 Document Revised: 07/26/2014 Document Reviewed: 04/15/2014 Elsevier Interactive Patient Education 2016 [employer].  

## 2015-03-16 NOTE — Progress Notes (Signed)
Report called to [place] [facility] to [facility] LPN.  Questions answered transport called. Patient and family aware.

## 2015-03-16 NOTE — Progress Notes (Signed)
Pt for discharge to [facility].   CSW facilitated pt discharge needs including contacting facility, faxing pt discharge summary via [place], discussing with pt daughter, Marijo File, providing RN phone number to call report, and arranging ambulance transport for pt to [facility].  No further social work needs identified at [employer]  CSW signing off.   Alison Murray, MSW, [place] Work [phone]

## 2015-03-16 NOTE — Care Management Important Message (Signed)
Important Message  Patient Details  Name: [name] [name] MRN: [id number] Date of Birth: [birth year]   Medicare Important Message Given:  Yes    [name] 03/16/2015, 12:10 PMImportant Message  Patient Details  Name: [name] [name] MRN: [id number] Date of Birth: 03-17-1923   Medicare Important Message Given:  Yes    [name] 03/16/2015, 12:10 PM

## 2015-03-16 NOTE — Discharge Summary (Signed)
Physician Discharge Summary  ETHON CAMPBEL [id number] DOB: [birth year] DOA: 03/11/2015  PCP: Walker Kehr, MD   Admit date: 03/11/2015 Discharge date: 03/16/2015  Recommendations for Outpatient Follow-up:  1. Check CBC and BMP per SNF protocol  2. No chagres in medications on discharge   Discharge Diagnoses:  Principal Problem:   Fracture of proximal end of right femur [facility]) Active Problems:   Hypothyroidism   Essential hypertension   Memory loss   Automatic implantable cardioverter-defibrillator in situ   Anemia in chronic renal disease   Thrombocytopenia (HCC)   Diffuse large B cell lymphoma (HCC)   Dyslipidemia   Leukocytosis   Chronic combined systolic and diastolic CHF (congestive heart failure) (HCC)   CKD (chronic kidney disease) stage 3, GFR 30-59 ml/min   Acute respiratory failure with hypoxemia (HCC)   Right femoral fracture, closed, initial encounter   Pressure ulcer    Discharge Condition: stable   Diet recommendation: as tolerated, meds with puree   History of present illness:  81 y.o. male with past medical history of memory loss, chronic combined CHF (2 D ECHO in 2014 EF 35% and grade 2 diastolic dysfunction), DLBCL (on disease progression), CKD stage 3 (baseline Cr 1.7) who presented to [facility] ED status post fall. He sustained acute periprosthetic fracture of the right proximal femur and per ortho this is managed non operatively.  While in ED, patient received 6 mg of morphine and became hypoxic but since then able to keep O2 sats above 90%. Hospital course is complicated with ongoing inability to get up from the bed because of the pain in her right hip area. He also has pain in the right upper extremity which evaluated with x-rays and they did not show acute fractures. Orthopedics re-consulted for input on pain and fracture in the right hip area.  Hospital Course:    Assessment/Plan:    Principal Problem:  Fracture of proximal end of right  femur ([place]) / Acute periprosthetic fracture of the right proximal femur / right upper extremity pain - Ortho surgery consulted from ED but recommended non operative management  - Repeat X ray shows stable findings  - Per PT - SNF placement, family prefers SNF placement  - In regards to right upper extremity pain, no acute fractures on x-ray studies - RUE doppler negative for DVT  Active Problems:  Acute respiratory failure with hypoxemia - No further issues with hypoxemia since the admission. It was likely due to pain medications given in ED. So far he is saturating above 90% without nasal cannula oxygen support   Hypothyroidism - Continue synthroid 100 g daily   Essential hypertension - Continue metoprolol    Chronic systolic and diastolic heart failure (HCC) / Automatic implantable cardioverter-defibrillator in situ - 2 D ECHO in 10/2012 with EF of 35% and grade 2 diastolic dysfunction - Compensated    Memory loss - Continue aricept on discharge    CKD (chronic kidney disease), stage 3 (HCC) - Baseline Cr 1.7 in 11/2014. Cr even better, 1.6 prior to discharge    Anemia in chronic renal disease - Secondary to combination of chronic kidney disease and history of lymphoma - Hgb stable, 10.3   Diffuse large B cell lymphoma (HCC) - The PET CT scan previously showed no evidence of active disease. - From the lymphoma standpoint, pt had stage I disease. - Follows with Dr. Alvy Bimler    Thrombocytopenia [facility]) - Likely form history of DLBCL - Platelets stable at 22  Dyslipidemia - Continue atorvastatin    Leukocytosis - No evidence of acute infectious process   DVT Prophylaxis  - SCDs in hospital   Code Status: Full.  Family Communication: Spoke with the patient's daughter this morning   IV access:  Peripheral IV  Procedures and diagnostic studies:   UE vascular doppler 03/14/2015 - no DVT  Dg Forearm Right 03/13/2015 No fracture or  dislocation. Electronically Signed By: Sandi Mariscal M.D. On: 03/13/2015 11:27   Dg Humerus Right 03/13/2015 No definite fracture or dislocation. Further evaluation with dedicated right elbow and shoulder radiographs could be performed as clinically indicated. Electronically Signed By: Sandi Mariscal M.D. On: 03/13/2015 11:26   Dg Hand Complete Right 03/13/2015 No displaced fracture or dislocation. Electronically Signed By: Sandi Mariscal M.D. On: 03/13/2015 11:28   Ct Head Wo Contrast 03/11/2015 No acute intracranial abnormalities. Diffuse atrophy and small vessel ischemic changes. Postoperative left parietal craniectomy. Air-fluid levels in the maxillary antra bilaterally. Diffuse bone demineralization and degenerative change in the cervical spine. No acute displaced fractures identified. Old compression deformities at T2, T3, and T4. Electronically Signed By: Lucienne Capers M.D. On: 03/11/2015 22:08   Ct Cervical Spine Wo Contrast 03/11/2015 No acute intracranial abnormalities. Diffuse atrophy and small vessel ischemic changes. Postoperative left parietal craniectomy. Air-fluid levels in the maxillary antra bilaterally. Diffuse bone demineralization and degenerative change in the cervical spine. No acute displaced fractures identified. Old compression deformities at T2, T3, and T4. Electronically Signed By: Lucienne Capers M.D. On: 03/11/2015 22:08   Dg Chest Port 1 View 03/11/2015 1. Interstitial prominence bilaterally, similar to a chest x-ray of 10/26/2014 but significantly more prominent than on chest x-ray of 07/19/2013. This could represent worsening of a chronic interstitial thickening, however, I suspect at least some degree of acute interstitial edema related to mild volume overload. No evidence of pneumonia. 2. Stable cardiomediastinal silhouette. Electronically Signed By: Franki Cabot M.D. On: 03/11/2015 23:58   Dg Hip Unilat With Pelvis 2-3 Views Right 03/11/2015  1. Acute periprosthetic fracture involving the proximal right femur inches below the greater trochanter. Electronically Signed By: Kerby Moors M.D. On: 03/11/2015 20:52    Medical Consultants:  Orthopedic surgery  Other Consultants:  Physical therapy   IAnti-Infectives:   None   Signed:  Leisa Lenz, MD  [facility] 03/16/2015, 10:44 AM  Pager #: [phone]  Time spent in minutes: more than 30 minutes   Discharge Exam: Filed Vitals:   03/16/15 0546 03/16/15 0925  BP: 144/51 149/63  Pulse: 62 74  Temp: 98.2 F (36.8 C)   Resp: 16    Filed Vitals:   03/15/15 2159 03/16/15 0130 03/16/15 0546 03/16/15 0925  BP: 127/66 107/43 144/51 149/63  Pulse: 96 71 62 74  Temp: 98.4 F (36.9 C) 98.1 F (36.7 C) 98.2 F (36.8 C)   TempSrc: Oral Oral Oral   Resp: 16 14 16    Height:      Weight:      SpO2: 96% 92% 99%     General: Pt is alert, follows commands appropriately, not in acute distress Cardiovascular: Regular rate and rhythm, S1/S2 + Respiratory: Clear to auscultation bilaterally, no wheezing, no crackles, no rhonchi Abdominal: Soft, non tender, non distended, bowel sounds +, no guarding Extremities: no edema, no cyanosis, pulses palpable bilaterally DP and PT Neuro: Grossly nonfocal  Discharge Instructions  Discharge Instructions    Call MD for:  difficulty breathing, headache or visual disturbances    Complete by:  As directed  Call MD for:  persistant nausea and vomiting    Complete by:  As directed      Call MD for:  severe uncontrolled pain    Complete by:  As directed      Diet - low sodium heart healthy    Complete by:  As directed      Discharge instructions    Complete by:  As directed   Please be cautious in using pain meds for patient as it has caused more confusion and altered mental status.     Increase activity slowly    Complete by:  As directed             Medication List    TAKE these medications         acetaminophen 500 MG tablet  Commonly known as:  TYLENOL  Take 500 mg by mouth every 6 (six) hours as needed for headache.     albuterol (2.5 MG/3ML) 0.083% nebulizer solution  Commonly known as:  PROVENTIL  Take 3 mLs (2.5 mg total) by nebulization every 6 (six) hours as needed for wheezing or shortness of breath.     atorvastatin 20 MG tablet  Commonly known as:  LIPITOR  TAKE 1/2 TABLET TWICE DAILY     cholecalciferol 1000 UNITS tablet  Commonly known as:  VITAMIN D  Take 1,000 Units by mouth daily.     diltiazem 2 % Gel  Apply 1 application topically 3 (three) times daily.     docusate sodium 100 MG capsule  Commonly known as:  COLACE  Take 1 capsule (100 mg total) by mouth daily as needed for mild constipation.     donepezil 10 MG tablet  Commonly known as:  ARICEPT  TAKE 1 TABLET AT BEDTIME     DRY EYES OP  Apply 1 drop to eye daily.     fluticasone 50 MCG/ACT nasal spray  Commonly known as:  FLONASE  Place 2 sprays into both nostrils daily.     folic acid [id number] MCG tablet  Commonly known as:  FOLVITE  Take 400 mcg by mouth daily.     levothyroxine 100 MCG tablet  Commonly known as:  SYNTHROID, LEVOTHROID  Take 1 tablet (100 mcg total) by mouth daily.     methocarbamol 500 MG tablet  Commonly known as:  ROBAXIN  Take 1 tablet (500 mg total) by mouth every 6 (six) hours as needed for muscle spasms.     metoprolol succinate 25 MG 24 hr tablet  Commonly known as:  TOPROL-XL  Take 0.5 tablets (12.5 mg total) by mouth 2 (two) times daily.     nitroGLYCERIN 0.4 MG SL tablet  Commonly known as:  NITROSTAT  Place 1 tablet (0.4 mg total) under the tongue every 5 (five) minutes as needed. Chest pain     pantoprazole 40 MG tablet  Commonly known as:  PROTONIX  Take 1 tablet (40 mg total) by mouth daily.     PREVAGEN PO  Take 1 capsule by mouth daily.     triamcinolone 55 MCG/ACT nasal inhaler  Commonly known as:  NASACORT  Place 1 spray into the nose daily.      VICKS DAYQUIL/NYQUIL CLD & FLU PO  Take 2 capsules by mouth daily as needed (cold symtpoms).     VITAMIN C PO  Take 1 tablet by mouth daily.     VITAMIN E PO  Take 1 capsule by mouth daily.  Follow-up Information    Follow up with [facility].   Specialty:  Skilled Nursing Facility   Contact information:   [address] [place] [phone]      Follow up with Walker Kehr, MD. Call in 1 week.   Specialty:  Internal Medicine   Why:  Follow up appt after recent hospitalization   Contact information:   [place] [address] [phone]        The results of significant diagnostics from this hospitalization (including imaging, microbiology, ancillary and laboratory) are listed below for reference.    Significant Diagnostic Studies: Dg Forearm Right  03/13/2015  CLINICAL DATA:  Post fall coming down stairs 2 days ago with persistent right upper extremity pain. EXAM: RIGHT FOREARM - 2 VIEW COMPARISON:  Right humerus radiographs-earlier same day FINDINGS: No definite fracture or dislocation. A well-defined ossicles noted about the medial aspect of the proximal ulna, likely the sequela of remote avulsive injury. Limited visualization of the adjacent wrist and elbow is normal given obliquity and large field of view. No definite elbow joint effusion. An IV is seen within in the ventral aspect of the forearm. Regional soft tissues appear otherwise normal. No radiopaque foreign body. IMPRESSION: No fracture or dislocation. Electronically Signed   By: Sandi Mariscal M.D.   On: 03/13/2015 11:27   Ct Head Wo Contrast  03/11/2015  CLINICAL DATA:  Trip and fall injury. Headache. No loss of consciousness. EXAM: CT HEAD WITHOUT CONTRAST CT CERVICAL SPINE WITHOUT CONTRAST TECHNIQUE: Multidetector CT imaging of the head and cervical spine was performed following the standard protocol without intravenous contrast.  Multiplanar CT image reconstructions of the cervical spine were also generated. COMPARISON:  PET-CT scan 02/07/2015. CT head and cervical spine 04/30/2014. FINDINGS: CT HEAD FINDINGS Postoperative changes with left parietal craniectomy. Diffuse cerebral atrophy. Mild ventricular dilatation consistent with central atrophy. Low-attenuation changes in the deep white matter consistent small vessel ischemia. No mass effect or midline shift. No abnormal extra-axial fluid collections. Gray-white matter junctions are distinct. Basal cisterns are not effaced. No evidence of acute intracranial hemorrhage. No depressed skull fractures. Mastoid air cells are not opacified. Opacification of several ethmoid air cells bilaterally. Small air-fluid levels in the maxillary antra bilaterally. This may indicate effusion or acute sinusitis. No facial bone fractures are demonstrated within the field of view. Vascular calcifications. CT CERVICAL SPINE FINDINGS There is increased usual cervical lordosis likely associated with thoracic kyphosis. No anterior subluxation of cervical vertebrae. Normal alignment of the facet joints. C1-2 articulation appears intact. Diffuse bone demineralization. Diffuse degenerative changes throughout the cervical spine. Compression deformities are demonstrated at T2, T3, and T4 with kyphoplasty changes at T4. These findings are unchanged since a previous CT chest from 10/26/2014. Old compression is suggested. No prevertebral soft tissue swelling. No focal bone lesion or bone destruction. IMPRESSION: No acute intracranial abnormalities. Diffuse atrophy and small vessel ischemic changes. Postoperative left parietal craniectomy. Air-fluid levels in the maxillary antra bilaterally. Diffuse bone demineralization and degenerative change in the cervical spine. No acute displaced fractures identified. Old compression deformities at T2, T3, and T4. Electronically Signed   By: Lucienne Capers M.D.   On: 03/11/2015  22:08   Ct Cervical Spine Wo Contrast  03/11/2015  CLINICAL DATA:  Trip and fall injury. Headache. No loss of consciousness. EXAM: CT HEAD WITHOUT CONTRAST CT CERVICAL SPINE WITHOUT CONTRAST TECHNIQUE: Multidetector CT imaging of the head and cervical spine was performed following the  standard protocol without intravenous contrast. Multiplanar CT image reconstructions of the cervical spine were also generated. COMPARISON:  PET-CT scan 02/07/2015. CT head and cervical spine 04/30/2014. FINDINGS: CT HEAD FINDINGS Postoperative changes with left parietal craniectomy. Diffuse cerebral atrophy. Mild ventricular dilatation consistent with central atrophy. Low-attenuation changes in the deep white matter consistent small vessel ischemia. No mass effect or midline shift. No abnormal extra-axial fluid collections. Gray-white matter junctions are distinct. Basal cisterns are not effaced. No evidence of acute intracranial hemorrhage. No depressed skull fractures. Mastoid air cells are not opacified. Opacification of several ethmoid air cells bilaterally. Small air-fluid levels in the maxillary antra bilaterally. This may indicate effusion or acute sinusitis. No facial bone fractures are demonstrated within the field of view. Vascular calcifications. CT CERVICAL SPINE FINDINGS There is increased usual cervical lordosis likely associated with thoracic kyphosis. No anterior subluxation of cervical vertebrae. Normal alignment of the facet joints. C1-2 articulation appears intact. Diffuse bone demineralization. Diffuse degenerative changes throughout the cervical spine. Compression deformities are demonstrated at T2, T3, and T4 with kyphoplasty changes at T4. These findings are unchanged since a previous CT chest from 10/26/2014. Old compression is suggested. No prevertebral soft tissue swelling. No focal bone lesion or bone destruction. IMPRESSION: No acute intracranial abnormalities. Diffuse atrophy and small vessel ischemic  changes. Postoperative left parietal craniectomy. Air-fluid levels in the maxillary antra bilaterally. Diffuse bone demineralization and degenerative change in the cervical spine. No acute displaced fractures identified. Old compression deformities at T2, T3, and T4. Electronically Signed   By: Lucienne Capers M.D.   On: 03/11/2015 22:08   Dg Chest Port 1 View  03/11/2015  CLINICAL DATA:  Positive for hip fracture. Also with cough and shortness of breath today. History of COPD, hypertension, coronary artery disease, lymphoma. Ex-smoker. EXAM: PORTABLE CHEST 1 VIEW COMPARISON:  Chest x-ray dated 10/26/2014. FINDINGS: Heart size is upper normal, stable. Overall cardiomediastinal silhouette is stable in size and configuration. Left chest wall pacemaker/AICD is stable in position. Coarse interstitial markings are now seen bilaterally, similar to the previous chest x-ray of 10/26/2014 but significantly increased compared to an earlier chest x-ray of 07/19/2013. No confluent airspace opacity to suggest developed pneumonia. No pleural effusion. Osseous structures about the chest are unremarkable. IMPRESSION: 1. Interstitial prominence bilaterally, similar to a chest x-ray of 10/26/2014 but significantly more prominent than on chest x-ray of 07/19/2013. This could represent worsening of a chronic interstitial thickening, however, I suspect at least some degree of acute interstitial edema related to mild volume overload. No evidence of pneumonia. 2. Stable cardiomediastinal silhouette. Electronically Signed   By: Franki Cabot M.D.   On: 03/11/2015 23:58   Dg Humerus Right  03/13/2015  CLINICAL DATA:  Post fall going down stairs 2 days ago with persistent right upper extremity pain, worse at the level of the shoulder and elbow. EXAM: RIGHT HUMERUS - 2+ VIEW COMPARISON:  Right forearm radiographs -03/13/2015 FINDINGS: No definite displaced fracture. No definite dislocation given projection. Limited visualization of  the adjacent thorax is normal. Regional soft tissues appear normal. IMPRESSION: No definite fracture or dislocation. Further evaluation with dedicated right elbow and shoulder radiographs could be performed as clinically indicated. Electronically Signed   By: Sandi Mariscal M.D.   On: 03/13/2015 11:26   Dg Hand Complete Right  03/13/2015  CLINICAL DATA:  Post fall, now with right hand pain. EXAM: RIGHT HAND - COMPLETE 3+ VIEW COMPARISON:  Right forearm radiographs - earlier same day FINDINGS: No fracture or dislocation.  Joint spaces are preserved. No erosions. No evidence of chondrocalcinosis. An IV is noted about the ventral lateral aspect of the forearm. A pulse oximeter overlies the pointer finger. No radiopaque foreign body. IMPRESSION: No displaced fracture or dislocation. Electronically Signed   By: Sandi Mariscal M.D.   On: 03/13/2015 11:28   Dg Hip Port Unilat With Pelvis 1v Right  03/16/2015  CLINICAL DATA:  Right hip fracture following fall 6 days ago. Subsequent encounter. EXAM: DG HIP (WITH OR WITHOUT PELVIS) 1V PORT RIGHT COMPARISON:  03/11/2015 FINDINGS: Bipolar right hip prosthesis remains in appropriate position. A nondisplaced fracture is again seen along the lateral aspect of the femoral component of prosthesis, just inferior to the greater trochanter. This shows no significant change in appearance compared to previous study. Nondisplaced fracture is also seen involving the right inferior pubic ramus. No other pelvic fractures identified. IMPRESSION: Stable appearance of nondisplaced periprosthetic fracture of the proximal femur just inferior to the greater trochanter. Nondisplaced fracture of right inferior pubic ramus also noted. Electronically Signed   By: Earle Gell M.D.   On: 03/16/2015 09:18   Dg Hip Unilat With Pelvis 2-3 Views Right  03/11/2015  CLINICAL DATA:  Right hip pain.  Fall. EXAM: DG HIP (WITH OR WITHOUT PELVIS) 2-3V RIGHT COMPARISON:  None. FINDINGS: Previous right hip  arthroplasty. There is an acute periprosthetic fracture involving the proximal shaft of the right femur just below the greater trochanter. No dislocation identified. IMPRESSION: 1. Acute periprosthetic fracture involving the proximal right femur inches below the greater trochanter. Electronically Signed   By: Kerby Moors M.D.   On: 03/11/2015 20:52    Microbiology: No results found for this or any previous visit (from the past 240 hour(s)).   Labs: Basic Metabolic Panel:  Recent Labs Lab 03/11/15 2055 03/12/15 0441 03/13/15 1215 03/14/15 0438 03/15/15 1145  NA 136 138 134* 136 136  K 4.8 5.1 4.7 4.8 4.3  CL 102 106 102 105 106  CO2 25 26 24 23 23   GLUCOSE 109* 106* 133* 136* 108*  BUN 36* 38* 42* 47* 41*  CREATININE 1.67* 1.82* 2.06* 2.27* 1.64*  CALCIUM 8.8* 8.6* 8.2* 8.2* 7.9*   Liver Function Tests:  Recent Labs Lab 03/12/15 0441  ALBUMIN 3.8   No results for input(s): LIPASE, AMYLASE in the last 168 hours. No results for input(s): AMMONIA in the last 168 hours. CBC:  Recent Labs Lab 03/11/15 2055 03/12/15 0441 03/13/15 1215 03/14/15 0438 03/15/15 1145  WBC 15.0* 10.4 12.1* 13.5* 10.9*  NEUTROABS 11.3*  --   --   --   --   HGB 12.8* 11.9* 10.8* 10.0* 10.3*  HCT 37.5* 35.3* 32.3* 29.3* 30.4*  MCV 91.7 92.4 92.6 91.6 92.4  PLT 141* 126* 95* 75* 93*   Cardiac Enzymes: No results for input(s): CKTOTAL, CKMB, CKMBINDEX, TROPONINI in the last 168 hours. BNP: BNP (last 3 results)  Recent Labs  10/26/14 1003  BNP 355.0*    ProBNP (last 3 results) No results for input(s): PROBNP in the last 8760 hours.  CBG: No results for input(s): GLUCAP in the last 168 hours.

## 2015-03-17 ENCOUNTER — Non-Acute Institutional Stay (SKILLED_NURSING_FACILITY): Payer: Medicare Other | Admitting: Internal Medicine

## 2015-03-17 DIAGNOSIS — I10 Essential (primary) hypertension: Secondary | ICD-10-CM | POA: Diagnosis not present

## 2015-03-17 DIAGNOSIS — J9601 Acute respiratory failure with hypoxia: Secondary | ICD-10-CM

## 2015-03-17 DIAGNOSIS — I5042 Chronic combined systolic (congestive) and diastolic (congestive) heart failure: Secondary | ICD-10-CM

## 2015-03-17 DIAGNOSIS — D631 Anemia in chronic kidney disease: Secondary | ICD-10-CM

## 2015-03-17 DIAGNOSIS — S72001D Fracture of unspecified part of neck of right femur, subsequent encounter for closed fracture with routine healing: Secondary | ICD-10-CM | POA: Diagnosis not present

## 2015-03-17 DIAGNOSIS — N189 Chronic kidney disease, unspecified: Secondary | ICD-10-CM | POA: Diagnosis not present

## 2015-03-20 ENCOUNTER — Non-Acute Institutional Stay (SKILLED_NURSING_FACILITY): Payer: Medicare Other | Admitting: Internal Medicine

## 2015-03-20 ENCOUNTER — Encounter (HOSPITAL_COMMUNITY)
Admission: RE | Admit: 2015-03-20 | Discharge: 2015-03-20 | Disposition: A | Discharge status: Home / Self Care | Payer: Medicare Other | Source: Skilled Nursing Facility | Attending: Internal Medicine | Admitting: Internal Medicine

## 2015-03-20 DIAGNOSIS — E890 Postprocedural hypothyroidism: Secondary | ICD-10-CM

## 2015-03-20 DIAGNOSIS — I129 Hypertensive chronic kidney disease with stage 1 through stage 4 chronic kidney disease, or unspecified chronic kidney disease: Secondary | ICD-10-CM

## 2015-03-20 DIAGNOSIS — I255 Ischemic cardiomyopathy: Secondary | ICD-10-CM

## 2015-03-20 DIAGNOSIS — S72141G Displaced intertrochanteric fracture of right femur, subsequent encounter for closed fracture with delayed healing: Secondary | ICD-10-CM

## 2015-03-20 DIAGNOSIS — M6281 Muscle weakness (generalized): Secondary | ICD-10-CM | POA: Insufficient documentation

## 2015-03-20 DIAGNOSIS — J9601 Acute respiratory failure with hypoxia: Secondary | ICD-10-CM

## 2015-03-20 LAB — BASIC METABOLIC PANEL
Anion gap: 6 (ref 5–15)
BUN: 44 mg/dL — AB (ref 6–20)
CO2: 25 mmol/L (ref 22–32)
CREATININE: 1.66 mg/dL — AB (ref 0.61–1.24)
Calcium: 8.2 mg/dL — ABNORMAL LOW (ref 8.9–10.3)
Chloride: 101 mmol/L (ref 101–111)
GFR calc Af Amer: 40 mL/min — ABNORMAL LOW (ref >60)
GFR, EST NON AFRICAN AMERICAN: 35 mL/min — AB (ref >60)
Glucose, Bld: 90 mg/dL (ref 65–99)
POTASSIUM: 4.2 mmol/L (ref 3.5–5.1)
SODIUM: 132 mmol/L — AB (ref 135–145)

## 2015-03-20 LAB — CBC WITH DIFFERENTIAL/PLATELET
BASOS ABS: 0.1 10*3/uL (ref 0.0–0.1)
BASOS PCT: 1 %
Eosinophils Absolute: 1 10*3/uL — ABNORMAL HIGH (ref 0.0–0.7)
Eosinophils Relative: 11 %
HEMATOCRIT: 30.2 % — AB (ref 39.0–52.0)
Hemoglobin: 10.3 g/dL — ABNORMAL LOW (ref 13.0–17.0)
Lymphocytes Relative: 15 %
Lymphs Abs: 1.4 10*3/uL (ref 0.7–4.0)
MCH: 30.8 pg (ref 26.0–34.0)
MCHC: 34.1 g/dL (ref 30.0–36.0)
MCV: 90.4 fL (ref 78.0–100.0)
Monocytes Absolute: 2 10*3/uL — ABNORMAL HIGH (ref 0.1–1.0)
Monocytes Relative: 21 %
Neutro Abs: 5 10*3/uL (ref 1.7–7.7)
Neutrophils Relative %: 52 %
Platelets: 182 10*3/uL (ref 150–400)
RBC: 3.34 MIL/uL — AB (ref 4.22–5.81)
RDW: 13.9 % (ref 11.5–15.5)
WBC: 9.5 10*3/uL (ref 4.0–10.5)

## 2015-03-20 NOTE — Progress Notes (Signed)
Patient ID: [name] [name], male   DOB: 09-16-1922, 81 y.o.   MRN: [id number]    Facility; [facility] Chief complaint admission to SNF post stay at [facility] through 03/16/2015 History; this is a 81 year old man who tells me he lives in his own home however his daughter lives nearby. He tells me to get Christmas de out of the attic and fell while mobilizing on acollapsible staircase He sufferedacute peripretic fracturehe right proximal femur. He was seen by orthopedics who suggested nonoperative management. He was given morphine in the ER and became hypoxic. He also complained of right upper extremity pain althoiugh xrays did not show any acute fractures. I cannot easily see a weightbearing status on him at this point. i do not see an orthopedics note. Physical therapy in the hospital also suggested that there was no weightbearing orders  The patient has a history of a cardiomyopathy with an ejection fraction in 2014 of 35%. He also has grade 2 diastolic dysfunction. This did not seem problematic in the hospital.He has chronic kidney failure stage III with a baseline creatinine of 1.7 He has an automatic implantable cardio  Defibrillator. All of this seems stable in the hospital.  BMP Latest Ref Rng 03/20/2015 03/15/2015 03/14/2015  Glucose 65 - 99 mg/dL 90 108(H) 136(H)  BUN 6 - 20 mg/dL 44(H) 41(H) 47(H)  Creatinine 0.61 - 1.24 mg/dL 1.66(H) 1.64(H) 2.27(H)  Sodium 135 - 145 mmol/L 132(L) 136 136  Potassium 3.5 - 5.1 mmol/L 4.2 4.3 4.8  Chloride 101 - 111 mmol/L 101 106 105  CO2 22 - 32 mmol/L 25 23 23   Calcium 8.9 - 10.3 mg/dL 8.2(L) 7.9(L) 8.2(L)    CBC Latest Ref Rng 03/20/2015 03/15/2015 03/14/2015  WBC 4.0 - 10.5 K/uL 9.5 10.9(H) 13.5(H)  Hemoglobin 13.0 - 17.0 g/dL 10.3(L) 10.3(L) 10.0(L)  Hematocrit 39.0 - 52.0 % 30.2(L) 30.4(L) 29.3(L)  Platelets 150 - 400 K/uL 182 93(L) 75(L)    Past medical history Cardiomyopathy? Ischemic cardioverter defibrillator in  situ Hypothyroidi Hypertension Diffuse large B-cell lymphomawith apparently recent PET scan negative Stage III chronic renal failure Hypoxemia in the hospital felt to be secondary to IV morphine Hyperlipidemia Memory loss/Dementia Anemia and chronic renal disease  Past Surgical History  Procedure Laterality Date  . Heart disease      permanent pacemaker  . Appendectomy    . Inguinal hernia repair    . Hip fracture surgery  2011    ORIF  R.  . Pacemaker insertion    . Colonoscopy  12/1998, 02/2004    diverticulosois, external hemorrhoids  . Esophagogastroduodenoscopy  06/17/2014    Dr. Oneida Alar: 1. Patent stricture at the gastroesophageal junction 2. UGIB Due to multiple  gastric ulcers 3. Moderate Duodentitis. Negative H.pylori  . Esophagogastroduodenoscopy N/A 10/17/2014    Procedure: ESOPHAGOGASTRODUODENOSCOPY (EGD);  Surgeon: Danie Binder, MD;  Location: [facility];  Service: Endoscopy;  Laterality: N/A;  1030  . Esophagogastroduodenoscopy N/A 02/10/2015    Procedure: ESOPHAGOGASTRODUODENOSCOPY (EGD);  Surgeon: Danie Binder, MD;  Location: [facility];  Service: Endoscopy;  Laterality: N/A;  115  . Craniotomy Left     Current Outpatient Prescriptions on File Prior to Visit  Medication Sig Dispense Refill  . acetaminophen (TYLENOL) 500 MG tablet Take 500 mg by mouth every 6 (six) hours as needed for headache.    . albuterol (PROVENTIL) (2.5 MG/3ML) 0.083% nebulizer solution Take 3 mLs (2.5 mg total) by nebulization every 6 (six) hours as needed for wheezing or  shortness of breath. 375 mL 1  . Apoaequorin (PREVAGEN PO) Take 1 capsule by mouth daily.    . Artificial Tear Ointment (DRY EYES OP) Apply 1 drop to eye daily.    . Ascorbic Acid (VITAMIN C PO) Take 1 tablet by mouth daily.    Marland Kitchen atorvastatin (LIPITOR) 20 MG tablet TAKE 1/2 TABLET TWICE DAILY 90 tablet 2  . Cholecalciferol (VITAMIN D3) 1000 UNITS tablet Take 1,000 Units by mouth daily.      Marland Kitchen diltiazem 2 % GEL  Apply 1 application topically 3 (three) times daily. 30 g 3  . docusate sodium (COLACE) 100 MG capsule Take 1 capsule (100 mg total) by mouth daily as needed for mild constipation. 10 capsule 0  . donepezil (ARICEPT) 10 MG tablet TAKE 1 TABLET AT BEDTIME 90 tablet 3  . fluticasone (FLONASE) 50 MCG/ACT nasal spray Place 2 sprays into both nostrils daily. 48 g 3  . folic acid (FOLVITE) [id number] MCG tablet Take 400 mcg by mouth daily.     Marland Kitchen levothyroxine (SYNTHROID, LEVOTHROID) 100 MCG tablet Take 1 tablet (100 mcg total) by mouth daily. 90 tablet 3  . methocarbamol (ROBAXIN) 500 MG tablet Take 1 tablet (500 mg total) by mouth every 6 (six) hours as needed for muscle spasms. 30 tablet 0  . metoprolol succinate (TOPROL-XL) 25 MG 24 hr tablet Take 0.5 tablets (12.5 mg total) by mouth 2 (two) times daily. 90 tablet 3  . nitroGLYCERIN (NITROSTAT) 0.4 MG SL tablet Place 1 tablet (0.4 mg total) under the tongue every 5 (five) minutes as needed. Chest pain 20 tablet 2  . pantoprazole (PROTONIX) 40 MG tablet Take 1 tablet (40 mg total) by mouth daily. (Patient taking differently: Take 40 mg by mouth 2 (two) times daily. ) 90 tablet 3  . Phenyleph-Doxylamine-DM-APAP (VICKS DAYQUIL/NYQUIL CLD & FLU PO) Take 2 capsules by mouth daily as needed (cold symtpoms).    . triamcinolone (NASACORT) 55 MCG/ACT nasal inhaler Place 1 spray into the nose daily. 3 Inhaler 1  . VITAMIN E PO Take 1 capsule by mouth daily.      Social; patient states he lives in his own home with a daughter nearby. He states he has no fall history and was not using ambulatory assist dev. He does not have any advanced directives  reports that he quit smoking about 38 years ago. He has never used smokeless tobacco. He reports that he does not drink alcohol or use illicit drugs.  indicated that his mother is deceased. He indicated that his father is deceased. He indicated that his sister is alive. He indicated that his brother is deceased.    Review  of systems; Gen. No weight loss Respiratory; no shortness of breath no cough no sputum Cardiac no chest pain or palpitations GI; no dysphagia nausea vomiting or adominal pain GU no dysuriahe appears to be continent. Musculoskeletal states he is having right hip pain States that the arm pain he complained about in the hospital was much better Neurologic; no focal weakness or numbness  Physical examination Gen. The patient looks well. Vitals; respiratory rate 18 and unlabored, O2 sat 95% on room air; it appears that he has oxygen on but was not using it when I went in the room, pulse rate 88 and regular HEENT; oral exam is normal Repiratory clear entry bilaterally Cardiac;pacemaker/defibrillator noted in the left upper chest. Heart sounds normal no S4 no S3 no murmurs Abdomen;no liver no spleen no  Masses Lymph nonpalpable in  the cervical clavicular axilla rate or inguinal areas GU bladder is not enlarged or tender there is no  Skin; I see no pressure areas in the heels buttock circumflex coccyx area Musculoskeletal; the right hip area looks finethere is no edemano evidence of a DVT Range of  Motion in the right elbow and shoulder appears to be normal Neurologic; no focal abnormalities Mental status; appears to have mild memory issues. No evidence of delirium or depression  Impression/plan #1 periprosthetic right hip fracture; this was managed nonoperatively. I do not see weight-bearing status in fact I can't even see an orthopedic consult.  He has noorders for DVT prophylaxis. Apparently she received sequential compression devices in the hospital #2hypoxemia in the hospital felt secondary to IV morphine he received in the ER. He currentl can be tapered from oxygen his lungs are clear #3 ischemic cardiomyopathy; I see no evidence of heart failure the bedside this issue seems stable #4 chronic renal failure stage III I would wonder about his vitamin D and bone status. He is on vitamin D at  thousand units daily #5allergic rhinitis this seems stable #6 dementia; I think this is probably mild #7on twice a day Toprol-XL 12.5 twice a day The reason for this is unclear although he does have a pacemaker. #8 hypothyroidism on replacement at 100 g daily. Interestingly in August he had a TSH of over 11 but also an elevated free T4. Might be worthwhile rechecking this while he is here. I wonder if he was in some form of transition at the time. #9 hyperlipidemia on Lipitor no reason for [place] to be concerned about this

## 2015-03-21 ENCOUNTER — Encounter: Payer: Self-pay | Admitting: Internal Medicine

## 2015-03-21 ENCOUNTER — Encounter: Payer: Self-pay | Admitting: *Deleted

## 2015-03-21 NOTE — Progress Notes (Signed)
Patient ID: [name] [name], male   DOB: 1922-07-28, 81 y.o.   MRN: [id number]   This is an acute visit.   Level care skilled.  Facility [facility].  Chief complaint acute visit status post hospitalization for right femur fracture treated conservatively-acute respiratory failure with hypoxi History of present illness.  Patient is a pleasant 81 year old male who is been here previously he has a history of dementia combined CHF-chronic kidney disease stage III.  He presented to the emergency room status post fall and was found to have an acute or prostatic fracture of the right proximal femur.  This was managed per or phone nonoperatively.  While in the ED he had significant pain and received 6 mg of morphine and became hypoxic.  However since then he's been able to keep his O2 saturations above 90%.  Hospital course was complicated with ongoing pain with inability to get up from bed because of pain  Repeat x-ray of the right upper extremity where he was complaining of pain also did not show any acute fracture.  Right lower extremity Doppler was negative for DVT as well  Currently he is receiving Robaxin 500 mg every 6 hours as needed.--This appears to be helping although I suspect this may continue to be an issue as we go along with therapy this will have to be monitored.  Respiratory wise he did come in on oxygen and he is requesting this be made when necessary-and I did check him after being off f his O2 stat  For some time-- was well above 90% will make this when necessary  Regards to his combined systolic and diastolic CHF status post automatic implantable cardioverter defibrillator-this appears to be stable and compensated.  Chronic kidney disease appears to be stable with a creatinine of 1.64 as of December 22 this is relatively his baseline.  Patient does have a history of memory loss this appears to be mild per my discussion with him he did remember me from his last some  time ago.  Previous medical history.  Fracture proximal end of right femur.  Hypothyroidism.  Hypertension.  Memory loss.  Automatic implantable cardioverter defibrillator.  Anemia chronic renal disease.  Cytopenia.  Diffuse large B-cell lymphoma.  Dyslipidemia.  Leukocytosis.  Chronic combined systolic and diastolic CHF.  Chronic kidney disease stage III.  Acute respiratory failure with hypoxia.  Right femoral fracture closed.  A history of pressure ulcer.  Osteoporosis.  History of sternal fracture.  History of seizures.  BPH.  Surgical history.  History of appendectomy.  History pacemaker insertion.  Inguinal hernia repair.  Right hip fracture repair.  .  Social history patient lives alone was ambulating independently.  He quit smoking 38 years ago-no history of smokeless tobacco alcohol or illicit drug use.  Family history is noncontributory  Medications.  Tylenol 5 mg every 6 hours when necessary.  Albuterol nebulizers every 6 hours when necessary.  Atorvastatin 10 mg twice a day.  Vitamin D thousand units daily.  Diltiazem 2% gel apply one up patient topically 3 times a day daily to rectum.  Colace 100 mg daily when necessary.  Aricept 10 mg daily at bedtime.  Flonase 2 sprays both nostrils daily.  Folic acid [id number] mg daily.  Synthroid 100 g daily.  Robaxin 500 mg every 6 hours when necessary.  Toprol-XL 12.5 mg twice a day.  Nitroglycerin 0.4 mg every 5 minutes when necessary chest pain 3.  Protonix 40 mg daily.  Triamcinolone nasal inhaler 1 spray  into nose daily.  Vicks DayQuil when necessary take 2 capsules as needed daily.  Vitamin C daily.  Vitamin E daily  Review of systems.  General does not complaining of any fever or chills.  Skin is not complaining of rashes or itching.  Head ears eyes nose mouth and throat-does not complain of visual changes or sore throat or nasal discharge.  Respiratory does  not complaining cough or shortness of breath I do see a possible history of COPD in the past did have hypoxia thought secondary to morphine in the ER-this apparently is resolved-she would like to be off oxygen routinely as noted above.  Cardiac does not complain of chest pain does have a history of chronic combined CHF.  GI is not complaining of any abdominal pain nausea vomiting diarrhea or constipation.  GU does not complain of dysuria.  Mus-l skeletal does have a history of pain as noted above with history of right hip fracture managed nonoperatively also had complain of right arm pain in hospital this appears to be somewhat improved-currently managed with Tylenol and Robaxin.  Neurologic is not complaining of dizziness headache or numbness.  In psych has a diagnosis of memory loss this appears to be fairly normal however.  Physical exam.  Temperature is 90.2 pulse 66 respirations 18 blood pressure 152/73.  In general this is a pleasant elderly male who looks younger than his stated age lying comfortably in bed.  His skin is warm and dry.  Eyes visual acuity appears to be grossly intact pupils are reactive to light sclerae and conjunctivae clear.  Oropharynx clear mucous membranes moist.  Chest is clear to auscultation there is no labored breathing.  Heart is regular rate and rhythm without murmur gallop or rub-he does not have significant lower extremity edema somewhat reduced pedal pulses bilaterally.  Abdomen is soft nontender with positive bowel sounds.  GU is within normal limits and did not note any rashes or drainage.  Muscle skeletal again limited mobility right hip and leg secondary to recent fracture and pain-grip strength appears to be intact otherwise moves his extremities at baseline I do not note any deformities.  Neurologic is grossly intact to speech is clear no lateralizing findings.  Psych he appears grossly alert and oriented pleasant and appropriate  apparently does have a history of mild memory loss  Labs.  03/15/2015.  WBC 10.9-hemoglobin 10.3-platelets 93,000.  Sodium 136 potassium 4.3 BUN 41 creatinine 1.64 CO2 level XXIII.  Calcium 7.9.  Assessment and plan.  History of right femur fracture proximal-with history of right upper external pain as well.  Orthopedic surgery recommended conservative nonoperative management.  He is here for skilled nursing follow-up in rehabilitation-pain currently managed with Tylenol and Robaxin when necessary this will have to be watched especially as he proceeds with therapy.  #2 history of acute respiratory failure with hypoxia no further issues since his admission to the hospital was likely due to pain medications.  Again a trial course off oxygen showed saturations above 90% will make oxygen when necessary.  He has requested nebulizers as well apparently receive these in the past with  relief and will order duo nebs every 6 hours when necessary Per chart review appears she does have some history of COPD-I note he is on Proventil nebulizers every 6 hours when necessary-as well as Flonase-and Nasacort as well as.  History of hypertension he is on metoprolol systolic somewhat elevated today but we have minimal readings at this point will monitor.  History  of chronic systolic and diastolic CHF-at this point clinically appears to be stable will order daily weights and notify provider of gain greater than 3 pounds he is on a beta blocker currently not on a diuretic again will have to be watched closely at this point appears stable.  History of memory loss he is on Aricept this appears to be mild at this time.  Chronic kidney disease this appears to be stable with a creatinine 1.64 most recent lab will update this next week.  Anemia and chronic renal disease this was secondary to herpes kidney disease and history of lymphoma hemoglobin was stable at 10.3 in the hospital will update this next week  as well.  History of diffuse large B-cell lymphoma-previous PET scan CT scan did not show evidence of active disease-from lymphoma standpoint had stage I disease-he is followed by oncology.  Thrombocytopenia apparently this is stable and chronic likely a form of DL BCL-platelets of 93,000 on most recent lab.  History of hyperlipidemia he continues on atorvastatin since his stay here is going to be relatively short I suspect will not be aggressive pursuing a lipid panel with situation changes certainly will readdress.  Leukocytosis this appears to be trending down apparently there was no evidence of acute infectious process will update this next week as well.  Hypothyroidism he does continue on Synthroid 100 g a day.  History of constipation-apparently he has been on diltiazem gel 3 times a day to help with this and has been effective-will write an order that he can have this at bedside.  he continues on Colace as well  [id number]-of note greater than 40 minutes spent assessing patient-discussing his concerns at bedside-reviewing his chart-and coordinating and formulating a plan of care for numerous diagnoses-of note greater than 50% of time spent coordinating plan of care.

## 2015-03-22 ENCOUNTER — Non-Acute Institutional Stay (SKILLED_NURSING_FACILITY): Payer: Medicare Other | Admitting: Internal Medicine

## 2015-03-22 DIAGNOSIS — J9601 Acute respiratory failure with hypoxia: Secondary | ICD-10-CM | POA: Diagnosis not present

## 2015-03-22 DIAGNOSIS — I255 Ischemic cardiomyopathy: Secondary | ICD-10-CM

## 2015-03-27 ENCOUNTER — Encounter (HOSPITAL_COMMUNITY)
Admission: RE | Admit: 2015-03-27 | Discharge: 2015-03-27 | Disposition: A | Discharge status: Home / Self Care | Payer: Medicare Other | Source: Skilled Nursing Facility | Attending: Internal Medicine | Admitting: Internal Medicine

## 2015-03-27 LAB — BASIC METABOLIC PANEL
ANION GAP: 8 (ref 5–15)
BUN: 56 mg/dL — ABNORMAL HIGH (ref 6–20)
CALCIUM: 8.4 mg/dL — AB (ref 8.9–10.3)
CO2: 24 mmol/L (ref 22–32)
Chloride: 105 mmol/L (ref 101–111)
Creatinine, Ser: 1.67 mg/dL — ABNORMAL HIGH (ref 0.61–1.24)
GFR, EST AFRICAN AMERICAN: 40 mL/min — AB (ref >60)
GFR, EST NON AFRICAN AMERICAN: 34 mL/min — AB (ref >60)
GLUCOSE: 97 mg/dL (ref 65–99)
POTASSIUM: 4.6 mmol/L (ref 3.5–5.1)
SODIUM: 137 mmol/L (ref 135–145)

## 2015-03-27 LAB — CBC
HEMATOCRIT: 33.1 % — AB (ref 39.0–52.0)
HEMOGLOBIN: 11.2 g/dL — AB (ref 13.0–17.0)
MCH: 31.2 pg (ref 26.0–34.0)
MCHC: 33.8 g/dL (ref 30.0–36.0)
MCV: 92.2 fL (ref 78.0–100.0)
Platelets: 281 10*3/uL (ref 150–400)
RBC: 3.59 MIL/uL — AB (ref 4.22–5.81)
RDW: 14.6 % (ref 11.5–15.5)
WBC: 11.9 10*3/uL — AB (ref 4.0–10.5)

## 2015-03-27 LAB — T4, FREE: FREE T4: 1.07 ng/dL (ref 0.61–1.12)

## 2015-03-27 LAB — TSH: TSH: 7.513 u[IU]/mL — AB (ref 0.350–4.500)

## 2015-03-27 NOTE — Progress Notes (Signed)
Patient ID: [name] [name], male   DOB: 1922-04-01, 82 y.o.   MRN: [id number]                PROGRESS NOTE  DATE:  03/22/2015           FACILITY: [facility]             LEVEL OF CARE:   SNF   Acute Visit             CHIEF COMPLAINT:  Follow up respiratory and cardiac status.     HISTORY OF PRESENT ILLNESS:  This is a patient who suffered a right periprosthetic fracture.  He received morphine IV in the ER and went into respiratory failure, requiring BiPAP.     He has a cardiomyopathy with an ejection fraction of 35%.  He has grade 2 diastolic dysfunction.    Finally, he has stage III chronic renal failure with a baseline creatinine of 1.7.    All of this seemed fairly stable to me when I saw him two days ago.  I ordered his lab work repeated next Monday.    The patient's only complaint today is that I have reduced his nebulizers, although I really did not do that at all.  He is on albuterol 2.5 q.6 hours p.r.n.  He did not come on anything else.  He has a medication for allergic rhinitis.      REVIEW OF SYSTEMS:    CHEST/RESPIRATORY:  States he has been congested.   CARDIAC:  He has not had any chest pain or palpitations.  No orthopnea.   GI:  No dysphagia.  No nausea or vomiting.         PHYSICAL EXAMINATION:   VITAL SIGNS:     PULSE:  77.   RESPIRATIONS:  18.   02 SATURATIONS:  95% on room air.    CHEST/RESPIRATORY:  Clear air entry bilaterally.   There is no wheezing.    CARDIOVASCULAR:   CARDIAC:  Pacemaker defibrillator in the left upper chest.  Heart sounds are normal.  No S3.   JVP is not elevated.       EDEMA/VARICOSITIES:  Extremities:  No evidence of edema.   GASTROINTESTINAL:   ABDOMEN:  No masses.     LIVER/SPLEEN/KIDNEYS:  No liver, no spleen.    ASSESSMENT/PLAN:                     Ischemic cardiomyopathy.  I see no evidence of this.    ?COPD.  He is a bit barrel-shape chested.  He was not on oxygen at home, but he says he had nebulizers.  I  do not see anything listed on his problem list.  For now, I will leave him on the nebulizers.  I see no additional treatment necessary for COPD.  The nebulizers are p.r.n.    He appears to have lost five pounds in two days on arrival here, from 155.9 pounds to 150.3 pounds.  I do not think this was edema.  He is not on diuretics.  I will continue to monitor.      CPT CODE: 65784

## 2015-03-28 ENCOUNTER — Ambulatory Visit: Payer: Medicare Other | Admitting: Internal Medicine

## 2015-03-28 ENCOUNTER — Non-Acute Institutional Stay (SKILLED_NURSING_FACILITY): Payer: Medicare Other | Admitting: Internal Medicine

## 2015-03-28 ENCOUNTER — Encounter (HOSPITAL_COMMUNITY)
Admission: AD | Admit: 2015-03-28 | Discharge: 2015-03-28 | Disposition: A | Discharge status: Home / Self Care | Payer: Medicare Other | Source: Skilled Nursing Facility | Attending: Internal Medicine | Admitting: Internal Medicine

## 2015-03-28 ENCOUNTER — Encounter: Payer: Self-pay | Admitting: Internal Medicine

## 2015-03-28 DIAGNOSIS — R05 Cough: Secondary | ICD-10-CM | POA: Diagnosis not present

## 2015-03-28 DIAGNOSIS — N189 Chronic kidney disease, unspecified: Secondary | ICD-10-CM

## 2015-03-28 DIAGNOSIS — D72829 Elevated white blood cell count, unspecified: Secondary | ICD-10-CM | POA: Diagnosis not present

## 2015-03-28 DIAGNOSIS — R059 Cough, unspecified: Secondary | ICD-10-CM

## 2015-03-28 LAB — URINALYSIS, ROUTINE W REFLEX MICROSCOPIC
BILIRUBIN URINE: NEGATIVE
GLUCOSE, UA: NEGATIVE mg/dL
HGB URINE DIPSTICK: NEGATIVE
KETONES UR: NEGATIVE mg/dL
Leukocytes, UA: NEGATIVE
Nitrite: NEGATIVE
PROTEIN: NEGATIVE mg/dL
Specific Gravity, Urine: 1.015 (ref 1.005–1.030)
pH: 6 (ref 5.0–8.0)

## 2015-03-28 LAB — VITAMIN D 25 HYDROXY (VIT D DEFICIENCY, FRACTURES): VIT D 25 HYDROXY: 41.4 ng/mL (ref 30.0–100.0)

## 2015-03-28 NOTE — Progress Notes (Signed)
Patient ID: ERIQ BULLOUGH, male   DOB: December 30, 1922, 83 y.o.   MRN: [id number]     This is an acute visit.   Level care skilled.  Facility [facility].  Chief complaint  Acute visit secondary leukocytosis-cough  i History of present illness.  Patient is a pleasant 82 year old male who is been here previously he has a history of dementia combined CHF-chronic kidney disease stage III.  He presented to the emergency room status post fall and was found to have an acute or prostatic fracture of the right proximal femur.  This was managed per or phone nonoperatively.  While in the ED he had significant pain and received 6 mg of morphine and became hypoxic.  However since then he's been able to keep his O2 saturations above 90%.  Hospital course was complicated with ongoing pain with inability to get up from bed because of pain   .  Respiratory wise  He did come in on oxygen but this is made made when necessary secondary to O2 stats in the 90s the does not really complain of any shortness of breath  Regards to his combined systolic and diastolic CHF status post automatic implantable cardioverter defibrillator-this appears to be stable and compensated.  Chronic kidney disease appears to be stable with a creatinine of 1.67 on lab done yesterday.  In the hospital he had leukocytosis with white count ranging up to 13.5-however per discharge summary there was no evidence of an infectious process.  His white count actually was 9.5 on lab done   On 03-20-15 however it is now 11.9 on lab done as of yesterday  He denies any fever or chills  or increase shortness of breath-he does have a slight cough somewhat productive-does not complain of any dysuria   . Marland Kitchen  Previous medical history.  Fracture proximal end of right femur.  Hypothyroidism.  Hypertension.  Memory loss.  Automatic implantable cardioverter defibrillator.  Anemia chronic renal disease.  Cytopenia.  Diffuse  large B-cell lymphoma.  Dyslipidemia.  Leukocytosis.  Chronic combined systolic and diastolic CHF.  Chronic kidney disease stage III.  Acute respiratory failure with hypoxia.  Right femoral fracture closed.  A history of pressure ulcer.  Osteoporosis.  History of sternal fracture.  History of seizures.  BPH.  Surgical history.  History of appendectomy.  History pacemaker insertion.  Inguinal hernia repair.  Right hip fracture repair.  .  Social history patient lives alone was ambulating independently.  He quit smoking 38 years ago-no history of smokeless tobacco alcohol or illicit drug use.  Family history is noncontributory  Medications.  Tylenol 5 mg every 6 hours when necessary.  Albuterol nebulizers every 6 hours when necessary.  Atorvastatin 10 mg twice a day.  Vitamin D thousand units daily.  Diltiazem 2% gel apply one up patient topically 3 times a day daily to rectum.  Colace 100 mg daily when necessary.  Aricept 10 mg daily at bedtime.  Flonase 2 sprays both nostrils daily.  Folic acid A999333 mg daily.  Synthroid 100 g daily.  Robaxin 500 mg every 6 hours when necessary.  Toprol-XL 12.5 mg twice a day.  Nitroglycerin 0.4 mg every 5 minutes when necessary chest pain 3.  Protonix 40 mg daily.  Triamcinolone nasal inhaler 1 spray into nose daily.  Vicks DayQuil when necessary take 2 capsules as needed daily.  Vitamin C daily.  Vitamin E daily  Review of systems.  General does not complaining of any fever or chills.  Skin is not complaining of rashes or itching.  Head ears eyes nose mouth and throat-does not complain of visual changes or sore throat or nasal discharge.  Respiratory --has what appears to be a mild cough somewhat productive there is no shortness of breath I do see a possible history of COPD in the past did have hypoxia thought secondary to morphine in the ER-  Cardiac does not complain of chest pain does have  a history of chronic combined CHF.  GI is not complaining of any abdominal pain nausea vomiting diarrhea or constipation.  GU does not complain of dysuria.  Mus-l skeletal does have a history of pain as noted above with history of right hip fracture managed nonoperatively also had complain of right arm pain in hospital this appears to be somewhat improved-currently managed with Tylenol and Robaxin.--Lovenox has been added for DVT prophylaxis  Neurologic is not complaining of dizziness headache or numbness.  In psych has a diagnosis of memory loss this appears to be fairly normal however.  Physical exam.  Temperature is 97.9 pulse 61 respirations 18 blood pressure 120/75 weight is 151.5 In general this is a pleasant elderly male who looks younger than his stated age--currently ambulating in his wheelchair  His skin is warm and dry.  Eyes visual acuity appears to be grossly intact pupils are reactive to light sclerae and conjunctivae clear.  Oropharynx clear mucous membranes moist.  Chest is clear to auscultation there is no labored breathing.  Heart is regular rate and rhythm without murmur gallop or rub-he does not have significant lower extremity edema   Abdomen is soft nontender with positive bowel sounds.  GU i could not really appreciate any suprapubic tenderness  Muscle skeletal again limited mobility right hip and leg secondary to recent fracture and pain-grip strength appears to be intact otherwise moves his extremities at baseline I do not note any deformities.  Neurologic is grossly intact to speech is clear no lateralizing findings.  Psych he appears grossly alert and oriented pleasant and appropriate apparently does have a history of mild memory loss  Labs.  Jan second 2017.  Sodium 137 potassium 4.6 BUN 56 creatinine 1.67.  WBC 11.9 hemoglobin 11.2 platelets 281  03/20/2015.  WBC 9.5 hemoglobin 10.3  .    03/15/2015.  WBC 10.9-hemoglobin  10.3-platelets 93,000.  Sodium 136 potassium 4.3 BUN 41 creatinine 1.64 CO2 level XXIII.  Calcium 7.9.  Assessment and planLeukocytosis-unclear etiology-he does not appear to be showing signs of infection the day there is no diarrhea and no fever no chills does not complain of increased shortness of does have a cough which appears to be relatively mild will order a 2 view chest x-ray to rule out any possible pneumonia-conclusion inhospital was  they did not think there was any infectious process- Also will obtain a UA C&S to rule out any UTI contributing to this.  Also will update a CBC with a differential tomorrow for follow-up.  Cough-again this appears relatively mild he does have some history it appears of COPD-- he is on nebulizers as well as a Proventil inhaler will add Mucinex 600 mg twice a day for 5 days--he is also on Flonase  .  History of chronic systolic and diastolic CHF-this appears to be stable he is on a beta blocker currently not on any diuretics-his weight appears to be stable he's gained a couple pounds the last 2 days however it appears he actually lost some weight since his admission I suspect this  weight gain is desired although will have to keep an eye on this-he does not really have significant edema    .  Chronic kidney disease this appears to be stable with a creatinine 1.67 on lab done yesterday  [phone]    Thrombocytopenia apparently this is stable and chronic likely a form of DL BCL-have risen to 281,000 on lab done yesterday     Leukocytosis this appears to be trending down apparently there was no evidence of acute infectious process will update this next week as well.  Hypothyroidism he does continue on Synthroid 100 g a day--T4 on lab done yesterday was within normal limits at 1.07-TSH was mildly elevated at 7.513.

## 2015-03-29 ENCOUNTER — Encounter (HOSPITAL_COMMUNITY)
Admission: AD | Admit: 2015-03-29 | Discharge: 2015-03-29 | Disposition: A | Discharge status: Home / Self Care | Payer: Medicare Other | Source: Skilled Nursing Facility | Attending: Internal Medicine | Admitting: Internal Medicine

## 2015-03-29 ENCOUNTER — Ambulatory Visit (HOSPITAL_COMMUNITY): Payer: No Typology Code available for payment source | Attending: Internal Medicine

## 2015-03-29 DIAGNOSIS — R05 Cough: Secondary | ICD-10-CM | POA: Insufficient documentation

## 2015-03-29 DIAGNOSIS — Z87891 Personal history of nicotine dependence: Secondary | ICD-10-CM | POA: Insufficient documentation

## 2015-03-29 DIAGNOSIS — J849 Interstitial pulmonary disease, unspecified: Secondary | ICD-10-CM | POA: Diagnosis not present

## 2015-03-29 DIAGNOSIS — Z8572 Personal history of non-Hodgkin lymphomas: Secondary | ICD-10-CM | POA: Insufficient documentation

## 2015-03-29 LAB — CBC WITH DIFFERENTIAL/PLATELET
BASOS ABS: 0 10*3/uL (ref 0.0–0.1)
BASOS PCT: 0 %
EOS ABS: 0.6 10*3/uL (ref 0.0–0.7)
Eosinophils Relative: 7 %
HEMATOCRIT: 28.9 % — AB (ref 39.0–52.0)
Hemoglobin: 9.7 g/dL — ABNORMAL LOW (ref 13.0–17.0)
Lymphocytes Relative: 16 %
Lymphs Abs: 1.4 10*3/uL (ref 0.7–4.0)
MCH: 30.7 pg (ref 26.0–34.0)
MCHC: 33.6 g/dL (ref 30.0–36.0)
MCV: 91.5 fL (ref 78.0–100.0)
MONO ABS: 1.6 10*3/uL — AB (ref 0.1–1.0)
Monocytes Relative: 17 %
NEUTROS ABS: 5.3 10*3/uL (ref 1.7–7.7)
NEUTROS PCT: 59 %
Platelets: 240 10*3/uL (ref 150–400)
RBC: 3.16 MIL/uL — ABNORMAL LOW (ref 4.22–5.81)
RDW: 14.2 % (ref 11.5–15.5)
WBC: 9 10*3/uL (ref 4.0–10.5)

## 2015-03-30 ENCOUNTER — Telehealth: Payer: Self-pay | Admitting: Cardiology

## 2015-03-30 NOTE — Telephone Encounter (Signed)
LMOVM for pt to return call 

## 2015-03-31 LAB — URINE CULTURE

## 2015-03-31 NOTE — Telephone Encounter (Signed)
LMOVM for pt to return call 

## 2015-04-05 ENCOUNTER — Telehealth: Payer: Self-pay | Admitting: *Deleted

## 2015-04-05 ENCOUNTER — Non-Acute Institutional Stay (SKILLED_NURSING_FACILITY): Payer: Medicare Other | Admitting: Internal Medicine

## 2015-04-05 ENCOUNTER — Encounter (HOSPITAL_COMMUNITY)
Admission: AD | Admit: 2015-04-05 | Discharge: 2015-04-05 | Disposition: A | Discharge status: Home / Self Care | Payer: Medicare Other | Source: Skilled Nursing Facility | Attending: Internal Medicine | Admitting: Internal Medicine

## 2015-04-05 DIAGNOSIS — N183 Chronic kidney disease, stage 3 unspecified: Secondary | ICD-10-CM

## 2015-04-05 DIAGNOSIS — N390 Urinary tract infection, site not specified: Secondary | ICD-10-CM

## 2015-04-05 DIAGNOSIS — R109 Unspecified abdominal pain: Secondary | ICD-10-CM

## 2015-04-05 DIAGNOSIS — K30 Functional dyspepsia: Secondary | ICD-10-CM | POA: Diagnosis not present

## 2015-04-05 DIAGNOSIS — D72829 Elevated white blood cell count, unspecified: Secondary | ICD-10-CM | POA: Diagnosis not present

## 2015-04-05 LAB — URINALYSIS, ROUTINE W REFLEX MICROSCOPIC
BILIRUBIN URINE: NEGATIVE
Glucose, UA: NEGATIVE mg/dL
HGB URINE DIPSTICK: NEGATIVE
Ketones, ur: NEGATIVE mg/dL
Leukocytes, UA: NEGATIVE
Nitrite: NEGATIVE
PH: 5.5 (ref 5.0–8.0)
Protein, ur: NEGATIVE mg/dL
SPECIFIC GRAVITY, URINE: 1.01 (ref 1.005–1.030)

## 2015-04-05 NOTE — Telephone Encounter (Signed)
3rd attempt   LMOVM for pt to return call.  

## 2015-04-05 NOTE — Telephone Encounter (Signed)
[name] [name] from the [facility] called requesting appointment for a right hip fracture, x-ray was done at [facility] 03/16/2015. Discharge summary instructions read (Orthopedics re-consulted for input on pain and fracture in the right hip area). It does not appear that it was Dr. Aline Brochure who saw the patient. Please review and advise Direct Number for [name] [name] [phone]

## 2015-04-05 NOTE — Progress Notes (Signed)
Patient ID: [name] [name], male   DOB: 03/17/1923, 82 y.o.   MRN: [id number]      This is an acute visit.   Level care skilled.  Facility [facility].  Chief complaint  Acute visit secondary to question stomach pain-  i History of present illness.  Patient is a pleasant 82 year old male who is been here previously he has a history of dementia combined CHF-chronic kidney disease stage III.  He presented to the emergency room status post fall and was found to have an acute or prostatic fracture of the right proximal femur.  This was managed per ortho  nonoperatively.  While in the ED he had significant pain and received 6 mg of morphine and became hypoxic.  However since then he's been able to keep his O2 saturations above 90%.  Hospital course was complicated with ongoing pain with inability to get up from bed because of pain   .  Respiratory wise  He did come in on oxygen but this is made made when necessary secondary to O2 stats in the 90s the does not really complain of any shortness of breath  Regards to his combined systolic and diastolic CHF status post automatic implantable cardioverter defibrillator-this appears to be stable and compensated.--Weights appear to be relatively stable  Chronic kidney disease appears to be stable with a creatinine of 1.67 on lab done January 2.  .  In the hospital he had leukocytosis with white count ranging up to 13.5-however per discharge summary there was no evidence of an infectious process.   His white count has varied here but most recent white count was 9.6 on January 4 this appears to have stabilized.  He did have a urine culture drawn which I have just reviewed and showed greater than 100,000 colonies of Citrobacter Koseri--as well as 50,000 colonies of enterococcus-he is not really complaining of increased dysuria or burning urination or back pain however.  He was complaining earlier today apparently of some mild  right-sided abdominal discomfort-however speaking with him he said this was a very short duration kind of a sharp pain that he experienced when he was being worked out in physical therapy has had no reoccurrence no nausea no vomiting  He denies any fever or chills  or increase shortness of breath-   . Marland Kitchen  Previous medical history.  Fracture proximal end of right femur.  Hypothyroidism.  Hypertension.  Memory loss.  Automatic implantable cardioverter defibrillator.  Anemia chronic renal disease.  Cytopenia.  Diffuse large B-cell lymphoma.  Dyslipidemia.  Leukocytosis.  Chronic combined systolic and diastolic CHF.  Chronic kidney disease stage III.  Acute respiratory failure with hypoxia.  Right femoral fracture closed.  A history of pressure ulcer.  Osteoporosis.  History of sternal fracture.  History of seizures.  BPH.  Surgical history.  History of appendectomy.  History pacemaker insertion.  Inguinal hernia repair.  Right hip fracture repair.  .  Social history patient lives alone was ambulating independently.  He quit smoking 38 years ago-no history of smokeless tobacco alcohol or illicit drug use.  Family history is noncontributory  Medications.  Tylenol 5 mg every 6 hours when necessary.  Albuterol nebulizers every 6 hours when necessary.  Atorvastatin 10 mg twice a day.  Vitamin D thousand units daily.  Diltiazem 2% gel apply one up patient topically 3 times a day daily to rectum.  Colace 100 mg daily when necessary.  Aricept 10 mg daily at bedtime.  Flonase 2 sprays both  nostrils daily.  Folic acid A999333 mg daily.  Synthroid 100 g daily.  Robaxin 500 mg every 6 hours when necessary.  Toprol-XL 12.5 mg twice a day.  Nitroglycerin 0.4 mg every 5 minutes when necessary chest pain 3.  Protonix 40 mg daily.  Triamcinolone nasal inhaler 1 spray into nose daily.  Vicks DayQuil when necessary take 2 capsules as needed  daily.  Vitamin C daily.  Vitamin E daily  Review of systems.  General does not complaining of any fever or chills.  Skin is not complaining of rashes or itching.  Head ears eyes nose mouth and throat-does not complain of visual changes or sore throat or nasal discharge.  Respiratory ---does not complain of shortness breath or cough currently  Cardiac does not complain of chest pain does have a history of chronic combined CHF.  GI is not complaining of any abdominal pain nausea vomiting diarrhea or constipation.--Had a short burst of low right-sided abdominal pain apparently during therapy  GU does not complain of dysuria--urine culture is suspicious for UTI.  Musculoskeletal-  At this point hip and arm pain appears to be relatively well controlled   Neurologic is not complaining of dizziness headache or numbness.  Ipsych has a diagnosis of memory loss t.  Physical exam.  He is afebrile pulse 68 respirations 18 blood pressure 107/55 weight is relatively stable at 153.6   In general this is a pleasant elderly male who looks younger than his stated age--  His skin is warm and dry.  Eyes visual acuity appears to be grossly intact pupils are reactive to light sclerae and conjunctivae clear.  Oropharynx clear mucous membranes moist.  Chest is clear to auscultation there is no labored breathing.  Heart is regular rate and rhythm without murmur gallop or rub-he does have some mild right lower extremity edema but this is not really an acute change    Abdomen is soft nontender with positive bowel sounds.  GU  could not really appreciate any suprapubic tendernes or CV tenderness  Muscle skeletal again limited mobility right hip and leg secondary to recent fracture and pain-grip strength appears to be intact otherwise moves his extremities at baseline I do not note any deformities.  Neurologic is grossly intact to speech is clear no lateralizing findings.  Psych he appears  grossly alert and oriented pleasant and appropriate apparently does have a history of mild memory loss--but appears quite functional   Labs.  03/28/2014.  WBC 9.6 hemoglobin 9.7 platelets 240.    Jan second 2017.  Sodium 137 potassium 4.6 BUN 56 creatinine 1.67.  WBC 11.9 hemoglobin 11.2 platelets 281  03/20/2015.  WBC 9.5 hemoglobin 10.3  .    03/15/2015.  WBC 10.9-hemoglobin 10.3-platelets 93,000.  Sodium 136 potassium 4.3 BUN 41 creatinine 1.64 Calcium 7.9.  Assessment and plan  Stomach discomfort-this appears to be very transitory related to exercise during therapy -- suspect possibly this may have been a muscle type pain-there's been no reoccurrence-will update a CMP to check his liver function tests however there's been no nausea or vomiting or further abdominal pain.     Leukocytosis This appears to have normalized with a white count of 9.6-urine culture had grown out 2 organisms greater than 100,000 colonies of Citrobacter as noted above as well as 50,000 colonies of enterococcus Citrobacter-is sensitive to Cipro will start this empirically but will update a urine culture to see where we stand currently he does not appear to be overtly symptomatic--  Also will  update a CBC with a differential tomorrow for follow-up.     Marland Kitchen  History of chronic systolic and diastolic CHF-this appears to be stable he is on a beta blocker currently not on any diuretics-his weight appears to be stable with minimal edema     [id number]

## 2015-04-06 ENCOUNTER — Encounter (HOSPITAL_COMMUNITY)
Admission: AD | Admit: 2015-04-06 | Discharge: 2015-04-06 | Disposition: A | Discharge status: Home / Self Care | Payer: Medicare Other | Source: Skilled Nursing Facility | Attending: Internal Medicine | Admitting: Internal Medicine

## 2015-04-06 ENCOUNTER — Encounter: Payer: Self-pay | Admitting: Internal Medicine

## 2015-04-06 DIAGNOSIS — N390 Urinary tract infection, site not specified: Secondary | ICD-10-CM | POA: Insufficient documentation

## 2015-04-06 DIAGNOSIS — R109 Unspecified abdominal pain: Secondary | ICD-10-CM | POA: Insufficient documentation

## 2015-04-06 LAB — COMPREHENSIVE METABOLIC PANEL
ALBUMIN: 3 g/dL — AB (ref 3.5–5.0)
ALT: 16 U/L — AB (ref 17–63)
AST: 21 U/L (ref 15–41)
Alkaline Phosphatase: 84 U/L (ref 38–126)
Anion gap: 8 (ref 5–15)
BUN: 55 mg/dL — ABNORMAL HIGH (ref 6–20)
CALCIUM: 8.7 mg/dL — AB (ref 8.9–10.3)
CO2: 24 mmol/L (ref 22–32)
CREATININE: 1.6 mg/dL — AB (ref 0.61–1.24)
Chloride: 102 mmol/L (ref 101–111)
GFR calc Af Amer: 42 mL/min — ABNORMAL LOW (ref >60)
GFR calc non Af Amer: 36 mL/min — ABNORMAL LOW (ref >60)
GLUCOSE: 94 mg/dL (ref 65–99)
Potassium: 4.4 mmol/L (ref 3.5–5.1)
SODIUM: 134 mmol/L — AB (ref 135–145)
Total Bilirubin: 0.6 mg/dL (ref 0.3–1.2)
Total Protein: 6.3 g/dL — ABNORMAL LOW (ref 6.5–8.1)

## 2015-04-06 LAB — CBC WITH DIFFERENTIAL/PLATELET
BASOS PCT: 1 %
Basophils Absolute: 0 10*3/uL (ref 0.0–0.1)
EOS ABS: 0.6 10*3/uL (ref 0.0–0.7)
Eosinophils Relative: 10 %
HEMATOCRIT: 30.9 % — AB (ref 39.0–52.0)
Hemoglobin: 10.4 g/dL — ABNORMAL LOW (ref 13.0–17.0)
Lymphocytes Relative: 26 %
Lymphs Abs: 1.7 10*3/uL (ref 0.7–4.0)
MCH: 31 pg (ref 26.0–34.0)
MCHC: 33.7 g/dL (ref 30.0–36.0)
MCV: 92 fL (ref 78.0–100.0)
MONO ABS: 1.1 10*3/uL — AB (ref 0.1–1.0)
MONOS PCT: 18 %
Neutro Abs: 2.8 10*3/uL (ref 1.7–7.7)
Neutrophils Relative %: 45 %
Platelets: 207 10*3/uL (ref 150–400)
RBC: 3.36 MIL/uL — ABNORMAL LOW (ref 4.22–5.81)
RDW: 14.2 % (ref 11.5–15.5)
WBC: 6.3 10*3/uL (ref 4.0–10.5)

## 2015-04-06 NOTE — Telephone Encounter (Signed)
xrays before visit   appt feb 7th

## 2015-04-06 NOTE — Telephone Encounter (Signed)
Patient is scheduled 05/02/15 at 8:40. Mavis at [facility] is aware.

## 2015-04-07 LAB — URINE CULTURE

## 2015-04-25 ENCOUNTER — Ambulatory Visit (INDEPENDENT_AMBULATORY_CARE_PROVIDER_SITE_OTHER): Payer: Medicare Other | Admitting: Internal Medicine

## 2015-04-25 ENCOUNTER — Encounter: Payer: Self-pay | Admitting: Internal Medicine

## 2015-04-25 VITALS — BP 102/64 | HR 75 | Ht 64.0 in | Wt 152.8 lb

## 2015-04-25 DIAGNOSIS — Z95810 Presence of automatic (implantable) cardiac defibrillator: Secondary | ICD-10-CM | POA: Diagnosis not present

## 2015-04-25 DIAGNOSIS — I5022 Chronic systolic (congestive) heart failure: Secondary | ICD-10-CM | POA: Diagnosis not present

## 2015-04-25 LAB — CUP PACEART INCLINIC DEVICE CHECK
Battery Voltage: 2.61 V
Brady Statistic AP VS Percent: 92.89 %
Brady Statistic AS VS Percent: 7.1 %
Brady Statistic RA Percent Paced: 92.9 %
Brady Statistic RV Percent Paced: 0.01 %
HIGH POWER IMPEDANCE MEASURED VALUE: 41 Ohm
HIGH POWER IMPEDANCE MEASURED VALUE: 58 Ohm
Implantable Lead Implant Date: 20031204
Implantable Lead Location: 753860
Implantable Lead Model: 6947
Lead Channel Impedance Value: 418 Ohm
Lead Channel Pacing Threshold Amplitude: 0.75 V
Lead Channel Sensing Intrinsic Amplitude: 11.75 mV
Lead Channel Sensing Intrinsic Amplitude: 2.375 mV
Lead Channel Setting Pacing Pulse Width: 0.4 ms
MDC IDC LEAD IMPLANT DT: 20031204
MDC IDC LEAD LOCATION: 753859
MDC IDC MSMT LEADCHNL RA IMPEDANCE VALUE: 475 Ohm
MDC IDC MSMT LEADCHNL RA PACING THRESHOLD PULSEWIDTH: 0.4 ms
MDC IDC MSMT LEADCHNL RV PACING THRESHOLD AMPLITUDE: 1 V
MDC IDC MSMT LEADCHNL RV PACING THRESHOLD PULSEWIDTH: 0.4 ms
MDC IDC SESS DTM: 20170131113018
MDC IDC SET LEADCHNL RA PACING AMPLITUDE: 2 V
MDC IDC SET LEADCHNL RV PACING AMPLITUDE: 2.5 V
MDC IDC SET LEADCHNL RV SENSING SENSITIVITY: 0.45 mV
MDC IDC STAT BRADY AP VP PERCENT: 0.01 %
MDC IDC STAT BRADY AS VP PERCENT: 0 %

## 2015-04-25 NOTE — Patient Instructions (Signed)
Medication Instructions:  Your physician recommends that you continue on your current medications as directed. Please refer to the Current Medication list given to you today.   Labwork: Your physician recommends that you return for lab work on: 05/19/15 anytime and you do not have to fast    Testing/Procedures:  ICD generator change out to a dual chamber Pacemaker insertion on 05/26/15   Please arrive at the [facility] at 7:30am   Do not eat or drink after midnight the night before your procedure.    Follow-Up: Your physician recommends that you schedule a follow-up appointment in: 10-14 days from 05/26/15 in the device clinic and 3 months from 05/26/15 with Dr Lovena Le   Any Other Special Instructions Will Be Listed Below (If Applicable).     If you need a refill on your cardiac medications before your next appointment, please call your pharmacy.

## 2015-04-25 NOTE — Progress Notes (Signed)
HPI [name] [name] returns today for followup. He is a pleasant elderly man with an ICM, chronic systolic CHF, s/p ICD implant. In the interim he has refractured his hip. He has class 2 CHF symptoms. No syncope. No edema.  He notes that he occasionally uses his nebulizer to help with his wheezing. He walks regularly. No chest pain. He is in rehab in a SNF. He has reached ERI on his device.  Allergies  Allergen Reactions  . Aleve [Naproxen Sodium] Other (See Comments)    Bleeding ulcers  . Ibuprofen Other (See Comments)    Bleeding ulcers     No current outpatient prescriptions on file.   No current facility-administered medications for this visit.   PMHX Chronic systolic heart failure Sinus node dysfunction    ROS:   All systems reviewed and negative except as noted in the HPI.   Past Surgical History  Procedure Laterality Date  . Heart disease      permanent pacemaker  . Appendectomy    . Inguinal hernia repair    . Hip fracture surgery  2011    ORIF  R.  . Pacemaker insertion    . Colonoscopy  12/1998, 02/2004    diverticulosois, external hemorrhoids  . Esophagogastroduodenoscopy  06/17/2014    Dr. Oneida Alar: 1. Patent stricture at the gastroesophageal junction 2. UGIB Due to multiple  gastric ulcers 3. Moderate Duodentitis. Negative H.pylori  . Esophagogastroduodenoscopy N/A 10/17/2014    Procedure: ESOPHAGOGASTRODUODENOSCOPY (EGD);  Surgeon: Danie Binder, MD;  Location: [facility];  Service: Endoscopy;  Laterality: N/A;  1030  . Esophagogastroduodenoscopy N/A 02/10/2015    Procedure: ESOPHAGOGASTRODUODENOSCOPY (EGD);  Surgeon: Danie Binder, MD;  Location: [facility];  Service: Endoscopy;  Laterality: N/A;  115  . Craniotomy Left      Family History  Problem Relation Age of Onset  . Coronary artery disease      male 1st degree relative<60  . Hypertension    . Cancer Sister      Social History   Social History  . Marital Status: Widowed    Spouse  Name: N/A  . Number of Children: 2  . Years of Education: N/A   Occupational History  . retired    Social History Main Topics  . Smoking status: Former Smoker -- 1.00 packs/day for 30 years    Quit date: 10/26/1976  . Smokeless tobacco: Never Used  . Alcohol Use: No  . Drug Use: No  . Sexual Activity: Not Currently   Other Topics Concern  . Not on file   Social History Narrative   Regular exercise--no.     BP 102/64 mmHg  Pulse 75  Ht 5\' 4"  (1.626 m)  Wt 152 lb 12.8 oz (69.31 kg)  BMI 26.22 kg/m2  Physical Exam: Chronically ill appearing elderly man, NAD HEENT: Unremarkable Neck:   6 cm JVD, no thyromegally Lungs:  Clear with no wheezes, rales, or rhonchi HEART:  Regular rate rhythm, no murmurs, no rubs, no clicks Abd:  soft, positive bowel sounds, no organomegally, no rebound, no guarding Ext:  2 plus pulses, no edema, no cyanosis, no clubbing Skin:  No rashes no nodules Neuro:  CN II through XII intact, motor grossly intact   DEVICE  Normal device function.  See PaceArt for details.   Assess/Plan:  1. Chronic systolic heart failure - he appears euvolemic. No medication change 2. S/p ICD implant - he has reached ERI. With his advanced age, will remove and  place a DDD PM 3. Sinus node dysfunction -for DDD PM.

## 2015-05-01 ENCOUNTER — Ambulatory Visit (HOSPITAL_COMMUNITY): Payer: No Typology Code available for payment source | Attending: Orthopedic Surgery

## 2015-05-01 DIAGNOSIS — S32591D Other specified fracture of right pubis, subsequent encounter for fracture with routine healing: Secondary | ICD-10-CM | POA: Diagnosis not present

## 2015-05-01 DIAGNOSIS — X58XXXD Exposure to other specified factors, subsequent encounter: Secondary | ICD-10-CM | POA: Insufficient documentation

## 2015-05-01 DIAGNOSIS — S72111D Displaced fracture of greater trochanter of right femur, subsequent encounter for closed fracture with routine healing: Secondary | ICD-10-CM | POA: Diagnosis not present

## 2015-05-02 ENCOUNTER — Encounter: Payer: Self-pay | Admitting: Orthopedic Surgery

## 2015-05-02 ENCOUNTER — Ambulatory Visit (INDEPENDENT_AMBULATORY_CARE_PROVIDER_SITE_OTHER): Payer: Medicare Other | Admitting: Orthopedic Surgery

## 2015-05-02 VITALS — BP 138/70 | Ht 64.0 in | Wt 152.8 lb

## 2015-05-02 DIAGNOSIS — M9701XA Periprosthetic fracture around internal prosthetic right hip joint, initial encounter: Secondary | ICD-10-CM | POA: Diagnosis not present

## 2015-05-02 NOTE — Progress Notes (Signed)
Patient ID: [name] [name], male   DOB: [birth year], 82 y.o.   MRN: [id number]  Chief Complaint  Patient presents with  . Hip Problem    er follow up Rt hip fx, DOI 03/10/16    HPI [name] [name] is a 82 y.o. male.   82 year old male status post right total hip proximally 2011. He's had a second fall and a second fracture in the periprosthetic region this time involving the right greater trochanter. This is a press-fit stem which fit taper design. He is now 5 weeks status post the injury. He's had a second x-ray which shows the fracture to be healing. There is questionable change in position of the prosthesis. (thinks may be [employer])  He complains of minimal right thigh pain he's been nonweightbearing in therapy at a local nursing home. He's been using a walker.  He is anxious to bear weight on his right leg. Clinically there is no deformity to the right lower extremity    Review of Systems Review of Systems  Constitutional: Negative for fever, activity change, appetite change and fatigue.  Cardiovascular: Positive for leg swelling.  Musculoskeletal: Positive for gait problem.  Neurological: Negative for numbness.    No past medical history on file.  Past Surgical History  Procedure Laterality Date  . Heart disease      permanent pacemaker  . Appendectomy    . Inguinal hernia repair    . Hip fracture surgery  2011    ORIF  R.  . Pacemaker insertion    . Colonoscopy  12/1998, 02/2004    diverticulosois, external hemorrhoids  . Esophagogastroduodenoscopy  06/17/2014    Dr. Oneida Alar: 1. Patent stricture at the gastroesophageal junction 2. UGIB Due to multiple  gastric ulcers 3. Moderate Duodentitis. Negative H.pylori  . Esophagogastroduodenoscopy N/A 10/17/2014    Procedure: ESOPHAGOGASTRODUODENOSCOPY (EGD);  Surgeon: Danie Binder, MD;  Location: [facility];  Service: Endoscopy;  Laterality: N/A;  1030  . Esophagogastroduodenoscopy N/A 02/10/2015    Procedure:  ESOPHAGOGASTRODUODENOSCOPY (EGD);  Surgeon: Danie Binder, MD;  Location: [facility];  Service: Endoscopy;  Laterality: N/A;  115  . Craniotomy Left     Family History  Problem Relation Age of Onset  . Coronary artery disease      male 1st degree relative<60  . Hypertension    . Cancer Sister     Social History Social History  Substance Use Topics  . Smoking status: Former Smoker -- 1.00 packs/day for 30 years    Quit date: 10/26/1976  . Smokeless tobacco: Never Used  . Alcohol Use: No    Allergies  Allergen Reactions  . Aleve [Naproxen Sodium] Other (See Comments)    Bleeding ulcers  . Ibuprofen Other (See Comments)    Bleeding ulcers    No current outpatient prescriptions on file.   No current facility-administered medications for this visit.       Physical Exam Physical Exam Blood pressure 138/70, height 5\' 4"  (1.626 m), weight 152 lb 12.8 oz (69.31 kg). Appearance, there are no abnormalities in terms of appearance the patient was well-developed and well-nourished. The grooming and hygiene were normal.  Mental status orientation, there was normal alertness and orientation Mood pleasant Ambulatory status normal with no assistive devices  Examination of the right hip and leg  Inspection normal leg lengths and limb alignment Range of motion hip flexion is 115 without pain Tests for stability post pull and shuck test is normal Motor strength  hip flexor strength grade 5 Skin warm dry and intact without laceration or ulceration or erythema Neurologic examination normal sensation Vascular examination normal pulses with warm extremity and normal capillary refill  The opposite extremity shows no abnormality. Alignment normal. Range of motion normal. Strength test normal.    Data Reviewed Several plain films are evaluated.  The first one from 2011 shows a total hip arthroplasty with screw in the acetabulum. Press-fit stem. Alignment looks normal  On  December 17 of 2016 there is a x-ray showing a greater token. Fracture with no change in position of this stem  X-ray on February 6 shows questionable change in position of the stem.  Assessment  Periprosthetic fracture greater trochanter with a transverse fracture line several centimeters into the shaft where the metaphyseal fixation would be.  Clinically there is no limb alignment change. Patient is anxious to weight-bear. Has minimal pain.  It is questionable whether he's had change in position of the stem and repeat radiographs will be obtained in about 4 weeks or sooner if he has trouble when he is weightbearing

## 2015-05-02 NOTE — Patient Instructions (Addendum)
WEIGHT BEARING AS TOLERATED WITH WALKER  4 WEEK FOLLOW UP WITH XRAYS AT HOSPITAL [facility]

## 2015-05-12 ENCOUNTER — Encounter: Payer: Self-pay | Admitting: Hematology and Oncology

## 2015-05-12 ENCOUNTER — Ambulatory Visit (HOSPITAL_BASED_OUTPATIENT_CLINIC_OR_DEPARTMENT_OTHER): Payer: Medicare Other | Admitting: Hematology and Oncology

## 2015-05-12 ENCOUNTER — Other Ambulatory Visit (HOSPITAL_BASED_OUTPATIENT_CLINIC_OR_DEPARTMENT_OTHER): Payer: Medicare Other

## 2015-05-12 ENCOUNTER — Telehealth: Payer: Self-pay | Admitting: Hematology and Oncology

## 2015-05-12 VITALS — BP 135/51 | HR 84 | Temp 98.4°F | Resp 18 | Ht 64.0 in | Wt 158.9 lb

## 2015-05-12 DIAGNOSIS — Z8572 Personal history of non-Hodgkin lymphomas: Secondary | ICD-10-CM | POA: Diagnosis not present

## 2015-05-12 DIAGNOSIS — D72821 Monocytosis (symptomatic): Secondary | ICD-10-CM | POA: Diagnosis not present

## 2015-05-12 DIAGNOSIS — S72001A Fracture of unspecified part of neck of right femur, initial encounter for closed fracture: Secondary | ICD-10-CM | POA: Diagnosis not present

## 2015-05-12 DIAGNOSIS — C8333 Diffuse large B-cell lymphoma, intra-abdominal lymph nodes: Secondary | ICD-10-CM

## 2015-05-12 DIAGNOSIS — N189 Chronic kidney disease, unspecified: Secondary | ICD-10-CM

## 2015-05-12 DIAGNOSIS — D631 Anemia in chronic kidney disease: Secondary | ICD-10-CM

## 2015-05-12 LAB — COMPREHENSIVE METABOLIC PANEL
ALBUMIN: 3.3 g/dL — AB (ref 3.5–5.0)
ALK PHOS: 73 U/L (ref 40–150)
ALT: 12 U/L (ref 0–55)
ANION GAP: 7 meq/L (ref 3–11)
AST: 16 U/L (ref 5–34)
BILIRUBIN TOTAL: 0.57 mg/dL (ref 0.20–1.20)
BUN: 38.4 mg/dL — ABNORMAL HIGH (ref 7.0–26.0)
CALCIUM: 8.6 mg/dL (ref 8.4–10.4)
CO2: 25 mEq/L (ref 22–29)
Chloride: 103 mEq/L (ref 98–109)
Creatinine: 1.7 mg/dL — ABNORMAL HIGH (ref 0.7–1.3)
EGFR: 34 mL/min/{1.73_m2} — AB (ref >90)
GLUCOSE: 96 mg/dL (ref 70–140)
POTASSIUM: 4.3 meq/L (ref 3.5–5.1)
SODIUM: 136 meq/L (ref 136–145)
TOTAL PROTEIN: 6.4 g/dL (ref 6.4–8.3)

## 2015-05-12 LAB — CBC WITH DIFFERENTIAL/PLATELET
BASO%: 1.7 % (ref 0.0–2.0)
BASOS ABS: 0.1 10*3/uL (ref 0.0–0.1)
EOS ABS: 0.4 10*3/uL (ref 0.0–0.5)
EOS%: 6.2 % (ref 0.0–7.0)
HCT: 36.4 % — ABNORMAL LOW (ref 38.4–49.9)
HEMOGLOBIN: 11.7 g/dL — AB (ref 13.0–17.1)
LYMPH%: 22 % (ref 14.0–49.0)
MCH: 29.4 pg (ref 27.2–33.4)
MCHC: 32.3 g/dL (ref 32.0–36.0)
MCV: 91.1 fL (ref 79.3–98.0)
MONO#: 2.3 10*3/uL — AB (ref 0.1–0.9)
MONO%: 35 % — AB (ref 0.0–14.0)
NEUT%: 35.1 % — ABNORMAL LOW (ref 39.0–75.0)
NEUTROS ABS: 2.3 10*3/uL (ref 1.5–6.5)
PLATELETS: 165 10*3/uL (ref 140–400)
RBC: 3.99 10*6/uL — ABNORMAL LOW (ref 4.20–5.82)
RDW: 14.3 % (ref 11.0–14.6)
WBC: 6.7 10*3/uL (ref 4.0–10.3)
lymph#: 1.5 10*3/uL (ref 0.9–3.3)

## 2015-05-12 LAB — TECHNOLOGIST REVIEW

## 2015-05-12 NOTE — Assessment & Plan Note (Signed)
This is likely anemia of chronic disease. The patient denies recent history of bleeding such as epistaxis, hematuria or hematochezia. He is asymptomatic from the anemia. We will observe for now.  

## 2015-05-12 NOTE — Assessment & Plan Note (Signed)
The PET CT scan showed no evidence of active disease. From the lymphoma standpoint, he had only stage I disease. I plan to follow with history, physical examination and blood work only in 6 months and to defer imaging study unless he has signs and symptoms suspicious for clinical recurrence

## 2015-05-12 NOTE — Assessment & Plan Note (Signed)
It was associated with traumatic event  He is doing well with physical rehabilitation. I recommend calcium with vitamin D supplement in the future

## 2015-05-12 NOTE — Progress Notes (Signed)
[facility] OFFICE PROGRESS NOTE  Patient Care Team: Cassandria Anger, MD as PCP - General Gatha Mayer, MD (Gastroenterology) Evans Lance, MD (Cardiology) Judeth Horn, MD (General Surgery) Danie Binder, MD as Consulting Physician (Gastroenterology) Heath Lark, MD as Consulting Physician (Hematology and Oncology)  SUMMARY OF ONCOLOGIC HISTORY:   History of B-cell lymphoma   04/30/2014 Imaging Pan CT scan after an accident showed no evidence of disease   06/17/2014 Pathology Results Accession: [id number] biopsy showed no evidence of cancer H Pylori   06/17/2014 Procedure EGD showed diffuse gastritis   10/17/2014 Procedure repeat EGD showed persistent stomach ulcer   10/17/2014 Pathology Results Accession: [id number] 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   10/26/2014 - 11/02/2014 Hospital Admission the patient was admitted for fevers and chills and was subsequently found to have bacteremia, resolved with IV anti-biotic therapy   10/26/2014 Imaging CT scan of the chest show new pulmonary nodules of unknown etiology   10/31/2014 Imaging CT scan of the abdomen show persistent liver cirrhosis but no regional lymphadenopathy   11/07/2014 Imaging PET CT scan showed no other evidence of cancer involvement   11/09/2014 - 01/27/2015 Chemotherapy he received weekly Rituxan x 4 then 2 more doses   02/07/2015 Imaging PEt scan showed no evidence of disease   02/10/2015 Procedure Repeat EGD showed no residual lymphoma. MILD Non-erosive gastritis    INTERVAL HISTORY: Please see below for problem oriented charting.  he returns for further follow-up. He recently had traumatic right hip fracture , managed conservatively with rehabilitation He complained of recent reflux  Denies recent infection. No new lymphadenopathy. The patient denies any recent signs or symptoms of bleeding such as spontaneous epistaxis, hematuria or hematochezia.  REVIEW OF SYSTEMS:   Constitutional:  Denies fevers, chills or abnormal weight loss Eyes: Denies blurriness of vision Ears, nose, mouth, throat, and face: Denies mucositis or sore throat Respiratory: Denies cough, dyspnea or wheezes Cardiovascular: Denies palpitation, chest discomfort or lower extremity swelling Skin: Denies abnormal skin rashes Lymphatics: Denies new lymphadenopathy or easy bruising Neurological:Denies numbness, tingling or new weaknesses Behavioral/Psych: Mood is stable, no new changes  All other systems were reviewed with the patient and are negative.  I have reviewed the past medical history, past surgical history, social history and family history with the patient and they are unchanged from previous note.  ALLERGIES:  is allergic to aleve and ibuprofen.  MEDICATIONS:  No current outpatient prescriptions on file.   No current facility-administered medications for this visit.    PHYSICAL EXAMINATION: ECOG PERFORMANCE STATUS: 1 - Symptomatic but completely ambulatory  Filed Vitals:   05/12/15 1311  BP: 135/51  Pulse: 84  Temp: 98.4 F (36.9 C)  Resp: 18   Filed Weights   05/12/15 1311  Weight: 158 lb 14.4 oz (72.077 kg)    GENERAL:alert, no distress and comfortable SKIN: skin color, texture, turgor are normal, no rashes or significant lesions EYES: normal, Conjunctiva are pink and non-injected, sclera clear OROPHARYNX:no exudate, no erythema and lips, buccal mucosa, and tongue normal  NECK: supple, thyroid normal size, non-tender, without nodularity LYMPH:  no palpable lymphadenopathy in the cervical, axillary or inguinal LUNGS: clear to auscultation and percussion with normal breathing effort HEART: regular rate & rhythm and no murmurs and no lower extremity edema ABDOMEN:abdomen soft, non-tender and normal bowel sounds Musculoskeletal:no cyanosis of digits and no clubbing  NEURO: alert & oriented x 3 with fluent speech, no focal motor/sensory deficits  LABORATORY DATA:  I have  reviewed the data as listed    Component Value Date/Time   NA 134* 04/06/2015 0730   NA 136 01/27/2015 1007   K 4.4 04/06/2015 0730   K 4.5 01/27/2015 1007   CL 102 04/06/2015 0730   CO2 24 04/06/2015 0730   CO2 23 01/27/2015 1007   GLUCOSE 94 04/06/2015 0730   GLUCOSE 112 01/27/2015 1007   BUN 55* 04/06/2015 0730   BUN 29.3* 01/27/2015 1007   CREATININE 1.60* 04/06/2015 0730   CREATININE 1.7* 01/27/2015 1007   CALCIUM 8.7* 04/06/2015 0730   CALCIUM 9.2 01/27/2015 1007   PROT 6.3* 04/06/2015 0730   PROT 6.7 01/27/2015 1007   ALBUMIN 3.0* 04/06/2015 0730   ALBUMIN 3.7 01/27/2015 1007   AST 21 04/06/2015 0730   AST 22 01/27/2015 1007   ALT 16* 04/06/2015 0730   ALT 16 01/27/2015 1007   ALKPHOS 84 04/06/2015 0730   ALKPHOS 78 01/27/2015 1007   BILITOT 0.6 04/06/2015 0730   BILITOT 0.84 01/27/2015 1007   GFRNONAA 36* 04/06/2015 0730   GFRAA 42* 04/06/2015 0730    No results found for: SPEP, UPEP  Lab Results  Component Value Date   WBC 6.7 05/12/2015   NEUTROABS 2.3 05/12/2015   HGB 11.7* 05/12/2015   HCT 36.4* 05/12/2015   MCV 91.1 05/12/2015   PLT 165 05/12/2015      Chemistry      Component Value Date/Time   NA 134* 04/06/2015 0730   NA 136 01/27/2015 1007   K 4.4 04/06/2015 0730   K 4.5 01/27/2015 1007   CL 102 04/06/2015 0730   CO2 24 04/06/2015 0730   CO2 23 01/27/2015 1007   BUN 55* 04/06/2015 0730   BUN 29.3* 01/27/2015 1007   CREATININE 1.60* 04/06/2015 0730   CREATININE 1.7* 01/27/2015 1007      Component Value Date/Time   CALCIUM 8.7* 04/06/2015 0730   CALCIUM 9.2 01/27/2015 1007   ALKPHOS 84 04/06/2015 0730   ALKPHOS 78 01/27/2015 1007   AST 21 04/06/2015 0730   AST 22 01/27/2015 1007   ALT 16* 04/06/2015 0730   ALT 16 01/27/2015 1007   BILITOT 0.6 04/06/2015 0730   BILITOT 0.84 01/27/2015 1007     ASSESSMENT & PLAN:  History of B-cell lymphoma  The PET CT scan showed no evidence of active disease. From the lymphoma standpoint, he  had only stage I disease. I plan to follow with history, physical examination and blood work only in 6 months and to defer imaging study unless he has signs and symptoms suspicious for clinical recurrence  Anemia in chronic renal disease This is likely anemia of chronic disease. The patient denies recent history of bleeding such as epistaxis, hematuria or hematochezia. He is asymptomatic from the anemia. We will observe for now.      Monocytosis  The cause is unknown. He has no clinical signs of infection. Observe only.  Fracture of proximal end of right femur [facility])  It was associated with traumatic event  He is doing well with physical rehabilitation. I recommend calcium with vitamin D supplement in the future   Orders Placed This Encounter  Procedures  . CBC with Differential/Platelet    Standing Status: Future     Number of Occurrences:      Standing Expiration Date: 06/15/2016  . Comprehensive metabolic panel    Standing Status: Future     Number of Occurrences:      Standing Expiration Date: 06/15/2016  All questions were answered. The patient knows to call the clinic with any problems, questions or concerns. No barriers to learning was detected. I spent 15 minutes counseling the patient face to face. The total time spent in the appointment was 20 minutes and more than 50% was on counseling and review of test results     [facility], [place], MD 05/12/2015 1:36 PM

## 2015-05-12 NOTE — Telephone Encounter (Signed)
Gave patient avs report and appointments for August.  °

## 2015-05-12 NOTE — Assessment & Plan Note (Signed)
The cause is unknown. He has no clinical signs of infection. Observe only.

## 2015-05-14 ENCOUNTER — Other Ambulatory Visit (HOSPITAL_COMMUNITY)
Admission: AD | Admit: 2015-05-14 | Discharge: 2015-05-14 | Disposition: A | Discharge status: Home / Self Care | Payer: Medicare Other | Source: Skilled Nursing Facility | Attending: Internal Medicine | Admitting: Internal Medicine

## 2015-05-14 DIAGNOSIS — D631 Anemia in chronic kidney disease: Secondary | ICD-10-CM | POA: Insufficient documentation

## 2015-05-14 DIAGNOSIS — J9601 Acute respiratory failure with hypoxia: Secondary | ICD-10-CM | POA: Insufficient documentation

## 2015-05-14 LAB — BASIC METABOLIC PANEL
ANION GAP: 9 (ref 5–15)
BUN: 37 mg/dL — ABNORMAL HIGH (ref 6–20)
CHLORIDE: 101 mmol/L (ref 101–111)
CO2: 24 mmol/L (ref 22–32)
CREATININE: 1.65 mg/dL — AB (ref 0.61–1.24)
Calcium: 8.2 mg/dL — ABNORMAL LOW (ref 8.9–10.3)
GFR calc non Af Amer: 35 mL/min — ABNORMAL LOW (ref >60)
GFR, EST AFRICAN AMERICAN: 41 mL/min — AB (ref >60)
Glucose, Bld: 89 mg/dL (ref 65–99)
POTASSIUM: 4.8 mmol/L (ref 3.5–5.1)
SODIUM: 134 mmol/L — AB (ref 135–145)

## 2015-05-14 LAB — URINALYSIS, ROUTINE W REFLEX MICROSCOPIC
Bilirubin Urine: NEGATIVE
Glucose, UA: NEGATIVE mg/dL
HGB URINE DIPSTICK: NEGATIVE
Ketones, ur: NEGATIVE mg/dL
LEUKOCYTES UA: NEGATIVE
NITRITE: NEGATIVE
PROTEIN: NEGATIVE mg/dL
SPECIFIC GRAVITY, URINE: 1.01 (ref 1.005–1.030)
pH: 5.5 (ref 5.0–8.0)

## 2015-05-14 LAB — CBC WITH DIFFERENTIAL/PLATELET
BASOS PCT: 0 %
Basophils Absolute: 0 10*3/uL (ref 0.0–0.1)
EOS ABS: 0.4 10*3/uL (ref 0.0–0.7)
Eosinophils Relative: 6 %
HEMATOCRIT: 32.8 % — AB (ref 39.0–52.0)
HEMOGLOBIN: 11 g/dL — AB (ref 13.0–17.0)
Lymphocytes Relative: 21 %
Lymphs Abs: 1.3 10*3/uL (ref 0.7–4.0)
MCH: 30.1 pg (ref 26.0–34.0)
MCHC: 33.5 g/dL (ref 30.0–36.0)
MCV: 89.9 fL (ref 78.0–100.0)
MONOS PCT: 43 %
Monocytes Absolute: 2.7 10*3/uL — ABNORMAL HIGH (ref 0.1–1.0)
NEUTROS ABS: 2 10*3/uL (ref 1.7–7.7)
NEUTROS PCT: 31 %
Platelets: 172 10*3/uL (ref 150–400)
RBC: 3.65 MIL/uL — AB (ref 4.22–5.81)
RDW: 14.5 % (ref 11.5–15.5)
WBC: 6.4 10*3/uL (ref 4.0–10.5)

## 2015-05-15 ENCOUNTER — Ambulatory Visit (HOSPITAL_COMMUNITY)
Admit: 2015-05-15 | Discharge: 2015-05-15 | Disposition: A | Discharge status: Home / Self Care | Payer: Medicare Other | Source: Ambulatory Visit | Attending: Internal Medicine | Admitting: Internal Medicine

## 2015-05-15 DIAGNOSIS — R509 Fever, unspecified: Secondary | ICD-10-CM | POA: Diagnosis not present

## 2015-05-15 DIAGNOSIS — R05 Cough: Secondary | ICD-10-CM | POA: Diagnosis not present

## 2015-05-16 ENCOUNTER — Non-Acute Institutional Stay (SKILLED_NURSING_FACILITY): Payer: Medicare Other | Admitting: Internal Medicine

## 2015-05-16 ENCOUNTER — Telehealth: Payer: Self-pay | Admitting: Internal Medicine

## 2015-05-16 ENCOUNTER — Other Ambulatory Visit (HOSPITAL_COMMUNITY)
Admission: RE | Admit: 2015-05-16 | Discharge: 2015-05-16 | Disposition: A | Discharge status: Home / Self Care | Payer: Medicare Other | Source: Skilled Nursing Facility | Attending: Internal Medicine | Admitting: Internal Medicine

## 2015-05-16 ENCOUNTER — Encounter: Payer: Self-pay | Admitting: Internal Medicine

## 2015-05-16 DIAGNOSIS — Z95810 Presence of automatic (implantable) cardiac defibrillator: Secondary | ICD-10-CM | POA: Diagnosis not present

## 2015-05-16 DIAGNOSIS — I5042 Chronic combined systolic (congestive) and diastolic (congestive) heart failure: Secondary | ICD-10-CM

## 2015-05-16 DIAGNOSIS — R042 Hemoptysis: Secondary | ICD-10-CM | POA: Insufficient documentation

## 2015-05-16 DIAGNOSIS — S72001D Fracture of unspecified part of neck of right femur, subsequent encounter for closed fracture with routine healing: Secondary | ICD-10-CM | POA: Diagnosis not present

## 2015-05-16 DIAGNOSIS — R05 Cough: Secondary | ICD-10-CM | POA: Diagnosis not present

## 2015-05-16 DIAGNOSIS — R531 Weakness: Secondary | ICD-10-CM | POA: Insufficient documentation

## 2015-05-16 DIAGNOSIS — R059 Cough, unspecified: Secondary | ICD-10-CM

## 2015-05-16 LAB — INFLUENZA PANEL BY PCR (TYPE A & B)
H1N1 flu by pcr: NOT DETECTED
Influenza A By PCR: NEGATIVE
Influenza B By PCR: NEGATIVE

## 2015-05-16 NOTE — Progress Notes (Signed)
Patient ID: [name] [name], male   DOB: 06/26/1922, 82 y.o.   MRN: [id number]       This is a routinevisit.   Level care skilled.  Facility [facility].  Chief complaint medical management of chronic medical issues including history of right hip fracture-ischemic cardiomyopathy-chronic renal failure-dementia-hypothyroidism-hyperlipidemia-history of lymphoma-.  Acute visit follow-up cough   i History of present illness.  Patient is a pleasant 82 year old male who is been here previously he has a history of dementia combined CHF-chronic kidney disease stage III. among other issues as stated above  He presented to the emergency room status post fall and was found to have an acute or prostatic fracture of the right proximal femur.  This was managed per ortho  nonoperatively.  While in the ED he had significant pain and received 6 mg of morphine and became hypoxic.  However since then he's been able to keep his O2 saturations above 90%.  Hospital course was complicated with ongoing pain with inability to get up from bed because of pain    This has improved he is now ambulatory  Regards to his combined systolic and diastolic CHF status post automatic implantable cardioverter defibrillator-this appears to be stable and compensated.--Weights appear to be relatively stable He is scheduled to have a replacement apparently later this week  Chronic kidney disease appears to be stable with a creatinine of 1.65 on lab done on February 19  . He also has a history of lymphoma B-cell-recent PET scan did not show evidence of active disease he has follow-up scheduled in approximately 6 months per oncology note.  He also has orthopedic follow-up scheduled for his hip fracture with follow-up x-rays ordered.  He is again ambulating fairly well here appears to have done well with this.  Most acute issue was over the weekend he did develop a fever with cough he was started on Avelox  empirically chest x-ray however did not really show any acute changes E urine also was ordered which is grown out enterococcus he has been started on amoxicillin for a seven-day course.  Currently he has no complaints does not complain of shortness of breath vital signs appear to be fairly stable.           . .  Previous medical history.  Fracture proximal end of right femur.  Hypothyroidism.  Hypertension.  Memory loss.  Automatic implantable cardioverter defibrillator.  Anemia chronic renal disease.  Cytopenia.  Diffuse large B-cell lymphoma.  Dyslipidemia.  Leukocytosis.  Chronic combined systolic and diastolic CHF.  Chronic kidney disease stage III.  Acute respiratory failure with hypoxia.  Right femoral fracture closed.  A history of pressure ulcer.  Osteoporosis.  History of sternal fracture.  History of seizures.  BPH.  Surgical history.  History of appendectomy.  History pacemaker insertion.  Inguinal hernia repair.  Right hip fracture repair.  .  Social history patient lives alone was ambulating independently.  He quit smoking 38 years ago-no history of smokeless tobacco alcohol or illicit drug use.  Family history is noncontributory  Medications.  Tylenol 5 mg every 6 hours when necessary.  Albuterol nebulizers every 6 hours when necessary.  Atorvastatin 10 mg twice a day.  Vitamin D thousand units daily.  Diltiazem 2% gel apply one up patient topically 3 times a day daily to rectum.  Colace 100 mg daily when necessary.  Aricept 10 mg daily at bedtime.  Flonase 2 sprays both nostrils daily.  Folic acid [id number] mg daily.  Synthroid 100 g daily.  Robaxin 500 mg every 6 hours when necessary.  Toprol-XL 12.5 mg twice a day.  Nitroglycerin 0.4 mg every 5 minutes when necessary chest pain 3.  Protonix 40 mg daily.  Triamcinolone nasal inhaler 1 spray into nose daily.  Vicks DayQuil when necessary take 2  capsules as needed daily.  Vitamin C daily.  Vitamin E daily  Review of systems. General does not complaining of any fever or chills.  Skin is not complaining of rashes or itching.  Head ears eyes nose mouth and throat-does not complain of visual changes or sore throat or nasal discharge.  Respiratory ---does not complain of shortness breath --has had a cough  Cardiac does not complain of chest pain does have a history of chronic combined CHF. It appears he is scheduled  for ICD replacement  GI is not complaining of any abdominal pain nausea vomiting diarrhea or constipation.--   GU does not complain of dysuria--urine culture is suspicious for UTI.  Musculoskeletal-  At this point hip pain appears to be relatively well controlled   Neurologic is not complaining of dizziness headache or numbness.  Ipsych has a diagnosis of memory loss .  Physical exam.  Temperature is 99.2 pulse 60 respirations 18 blood pressure 116/44--weight appears to be relatively stable at 154.5   In general this is a pleasant elderly male who looks younger than his stated age--  His skin is warm and dry.  Eyes visual acuity appears to be grossly intact pupils are reactive to light sclerae and conjunctivae clear.  Oropharynx clear mucous membranes moist.  Chest is clear to auscultation there is no labored breathing. Pacemaker defibrillator site on left side of thorax  Heart is regular rate and rhythm without murmur gallop or rub-he does have some mild right lower extremity edema but this is not really an acute change    Abdomen is soft nontender with positive bowel sounds.  GU  could not really appreciate any suprapubic tendernes or CV tenderness-again culture has indicated a UTI  Muscle skeletal--is now ambulating with a rolling walker appears to be doing relatively well with this pain appears to be under better control .  Neurologic is grossly intact to speech is clear no lateralizing  findings.  Psych he appears grossly alert and oriented pleasant and appropriate apparently does have a history of mild memory loss--but appears quite functional   Labs.   05/14/2015.  WBC 6.4 hemoglobin 11.0 platelets 172.  Sodium 134 potassium 4.8 BUN 37 creatinine 1.65.  UTI positive for enterococcus greater than 100,000 colonies 03/28/2014.  WBC 9.6 hemoglobin 9.7 platelets 240.    Jan second 2017.  Sodium 137 potassium 4.6 BUN 56 creatinine 1.67.  WBC 11.9 hemoglobin 11.2 platelets 281  03/20/2015.  WBC 9.5 hemoglobin 10.3  .    03/15/2015.  WBC 10.9-hemoglobin 10.3-platelets 93,000.  Sodium 136 potassium 4.3 BUN 41 creatinine 1.64    Assessment and plan.  #1 history of cough-again x-ray did not show any acute process he was on Avelox this is been DC'd in amoxicillin started secondary for UTI enterococcus. Regards to cough this appears to be gradually improving is on Mucinex also has albuterol nebulizers as needed At this point appears to be stable. tly this is helping. Also will obtain a flu swab.  #2-history of right hip fracture this appears stable he is now ambulating with a rolling walker pain appears to be controlled-one would wonder whether he needs to continue on the Lovenox for  DVT prevention-will await orthopedic input on this.  #3-history of cardiomyopathy with ejection fraction in 2014 of 35% with grade 2 diastolic dysfunction--his weight and edema have been baseline since his arrival here He does have a pacemaker/defibrillator in place apparently will be changes made at some point this week.  #4-history of chronic renal failure stage III this appears stable with a creatinine of 1.65 on most recent lab on February 19.  #5 history of dementia this appears to be quite mild he is on Aricept at this point will monitor he appears to have adapted. well to the facility.  #6 history of hypothyroidism he is on Synthroid--appears he has some history of  elevated TSH is in the past this would be worth rechecking.  #7 hyperlipidemia-he continues on a statin since his stay here I suspect we'll be fairly short have not been aggressive pursuing a lipid panel.  #8 history of B-cell lymphoma this is followed again it appears by oncology follow-up in approximately 6 months per review of oncology note most recent PET scan did not show evidence of active disease.  [id number] note greater than 35 minutes spent assessing patient-reviewing his chart-and coordinating formulating a plan of care for numerous diagnoses-of note greater than 50% of time spent coordinating plan of care

## 2015-05-16 NOTE — Telephone Encounter (Signed)
New Message  Pt dtr called to speak w/ RN to see if lab work originally sched for 2/24 for surgery can be done at extended care-rehab in [place]. Please call back and discuss.

## 2015-05-17 LAB — URINE CULTURE

## 2015-05-19 ENCOUNTER — Non-Acute Institutional Stay (SKILLED_NURSING_FACILITY): Payer: Medicare Other | Admitting: Internal Medicine

## 2015-05-19 ENCOUNTER — Encounter (HOSPITAL_COMMUNITY)
Admission: AD | Admit: 2015-05-19 | Discharge: 2015-05-19 | Disposition: A | Discharge status: Home / Self Care | Payer: Medicare Other | Source: Skilled Nursing Facility | Attending: Internal Medicine | Admitting: Internal Medicine

## 2015-05-19 ENCOUNTER — Encounter: Payer: Self-pay | Admitting: Internal Medicine

## 2015-05-19 ENCOUNTER — Other Ambulatory Visit: Payer: Medicare Other

## 2015-05-19 DIAGNOSIS — A047 Enterocolitis due to Clostridium difficile: Secondary | ICD-10-CM | POA: Insufficient documentation

## 2015-05-19 DIAGNOSIS — N183 Chronic kidney disease, stage 3 unspecified: Secondary | ICD-10-CM

## 2015-05-19 DIAGNOSIS — N10 Acute tubulo-interstitial nephritis: Secondary | ICD-10-CM

## 2015-05-19 DIAGNOSIS — R197 Diarrhea, unspecified: Secondary | ICD-10-CM | POA: Diagnosis not present

## 2015-05-19 DIAGNOSIS — D72829 Elevated white blood cell count, unspecified: Secondary | ICD-10-CM

## 2015-05-19 DIAGNOSIS — C8330 Diffuse large B-cell lymphoma, unspecified site: Secondary | ICD-10-CM | POA: Insufficient documentation

## 2015-05-19 DIAGNOSIS — Z4789 Encounter for other orthopedic aftercare: Secondary | ICD-10-CM | POA: Insufficient documentation

## 2015-05-19 DIAGNOSIS — N390 Urinary tract infection, site not specified: Secondary | ICD-10-CM | POA: Insufficient documentation

## 2015-05-19 LAB — CBC WITH DIFFERENTIAL/PLATELET
BASOS ABS: 0 10*3/uL (ref 0.0–0.1)
BASOS PCT: 0 %
EOS ABS: 0.1 10*3/uL (ref 0.0–0.7)
EOS PCT: 0 %
HCT: 33.2 % — ABNORMAL LOW (ref 39.0–52.0)
HEMOGLOBIN: 11.1 g/dL — AB (ref 13.0–17.0)
LYMPHS ABS: 1 10*3/uL (ref 0.7–4.0)
Lymphocytes Relative: 7 %
MCH: 29.8 pg (ref 26.0–34.0)
MCHC: 33.4 g/dL (ref 30.0–36.0)
MCV: 89.2 fL (ref 78.0–100.0)
Monocytes Absolute: 2.8 10*3/uL — ABNORMAL HIGH (ref 0.1–1.0)
Monocytes Relative: 20 %
NEUTROS PCT: 72 %
Neutro Abs: 10.3 10*3/uL — ABNORMAL HIGH (ref 1.7–7.7)
PLATELETS: 149 10*3/uL — AB (ref 150–400)
RBC: 3.72 MIL/uL — AB (ref 4.22–5.81)
RDW: 14.4 % (ref 11.5–15.5)
WBC: 14.2 10*3/uL — AB (ref 4.0–10.5)

## 2015-05-19 LAB — BASIC METABOLIC PANEL
Anion gap: 11 (ref 5–15)
BUN: 40 mg/dL — AB (ref 6–20)
CALCIUM: 7.2 mg/dL — AB (ref 8.9–10.3)
CHLORIDE: 102 mmol/L (ref 101–111)
CO2: 20 mmol/L — ABNORMAL LOW (ref 22–32)
CREATININE: 2.12 mg/dL — AB (ref 0.61–1.24)
GFR, EST AFRICAN AMERICAN: 30 mL/min — AB (ref >60)
GFR, EST NON AFRICAN AMERICAN: 26 mL/min — AB (ref >60)
Glucose, Bld: 107 mg/dL — ABNORMAL HIGH (ref 65–99)
Potassium: 3.8 mmol/L (ref 3.5–5.1)
SODIUM: 133 mmol/L — AB (ref 135–145)

## 2015-05-19 NOTE — Telephone Encounter (Signed)
Left message on vm looks like the labs were done at the facility

## 2015-05-19 NOTE — Progress Notes (Signed)
Patient ID: [name] [name], male   DOB: 19-May-1922, 82 y.o.   MRN: [id number]        This is an acute.   Level care skilled.  Facility [facility].  Chief complaint  Acute visit secondary to leukocytosis-diarrhea-renal insufficiency with creatinine 2.2 i History of present illness.  Patient is a pleasant 82 year old male who is been here previously he has a history of dementia combined CHF-chronic kidney disease stage III. among other issues  He presented to the emergency room status post fall and was found to have an acute or prostatic fracture of the right proximal femur.  This was managed per ortho  Nonoperatively.  He is here for rehabilitation.    Recently  he did develop a fever with cough he was started on Avelox empirically chest x-ray however did not really show any acute changes  urine also was ordered which is grown out enterococcus he has been started on amoxicillin for a seven-day course.   Size a history of chronic renal insufficiency with baseline creatinine in the 1.5-1.6 area it appears.  Today patient reports she is having some diarrhea.  Labs are also remarkable for an elevated white count of 14.2.  His creatinine also has risen up to 2.12 with a BUN of 40.  Patient says he just does not feel well.   Vital signs appear to be stable he is afebrile.  There is flu in the facility however a swab was negative for influenza     Previous medical history.  Fracture proximal end of right femur.  Hypothyroidism.  Hypertension.  Memory loss.  Automatic implantable cardioverter defibrillator.  Anemia chronic renal disease.  Cytopenia.  Diffuse large B-cell lymphoma.  Dyslipidemia.  Leukocytosis.  Chronic combined systolic and diastolic CHF.  Chronic kidney disease stage III.  Acute respiratory failure with hypoxia.  Right femoral fracture closed.  A history of pressure ulcer.  Osteoporosis.  History of sternal  fracture.  History of seizures.  BPH.  Surgical history.  History of appendectomy.  History pacemaker insertion.  Inguinal hernia repair.  Right hip fracture repair.  .  Social history patient lives alone was ambulating independently.  He quit smoking 38 years ago-no history of smokeless tobacco alcohol or illicit drug use.  Family history is noncontributory  Medications.  Tylenol 5 mg every 6 hours when necessary.  Albuterol nebulizers every 6 hours when necessary.  Atorvastatin 10 mg twice a day.  Vitamin D thousand units daily.  Diltiazem 2% gel apply one up patient topically 3 times a day daily to rectum.  Colace 100 mg daily when necessary.  Aricept 10 mg daily at bedtime.  Flonase 2 sprays both nostrils daily.  Folic acid A999333 mg daily.  Synthroid 100 g daily.  Robaxin 500 mg every 6 hours when necessary.    Toprol-XL 12.5 mg twice a day.  Nitroglycerin 0.4 mg every 5 minutes when necessary chest pain 3.  Protonix 40 mg daily.  Triamcinolone nasal inhaler 1 spray into nose daily.  Vicks DayQuil when necessary take 2 capsules as needed daily.  Vitamin C daily.  Vitamin E daily  He is completing course of amoxicillin for UTI  Review of systems. General does not complaining of any fever or chills. But just says he feels kind of sick  Skin is not complaining of rashes or itching.  Head ears eyes nose mouth and throat-does not complain of visual changes or sore throat or nasal discharge.  Respiratory ---does not complain of  shortness breath --has had a cough x-ray negative for any acute process  Cardiac does not complain of chest pain does have a history of chronic combined CHF.  GI is not complaining of any abdominal pain but does have diarrhea-   GU does not complain of   overtdysuria--urine culture is suspicious for UTI.  Musculoskeletal-  At this point hip pain appears to be relatively well controlled   Neurologic is not  complaining of dizziness headache or numbness.  Ipsych has a diagnosis of memory loss .  Physical exam.   Temperature 98.3 pulse 80 respirations 19 blood pressure 132/70   In general this is a pleasant elderly male who looks younger than his stated age--  His skin is warm and dry.  Eyes visual acuity appears to be grossly intact pupils are reactive to light sclerae and conjunctivae clear.  Oropharynx clear mucous membranes moist.  Chest --some bronchial sounds on the left. Pacemaker defibrillator site on left side of thorax  Heart is regular rate and rhythm without murmur gallop or rub-he does have some mild right lower extremity edema but this is not really an acute change    Abdomen is soft nontender with positive bowel sounds.  GU  could not really appreciate any suprapubic tenderness-again culture has indicated a UTI  Muscle skeletal--is now ambulating with a rolling walker appears to be doing relatively well with this pain appears to be under better control .  Neurologic is grossly intact to speech is clear no lateralizing findings.  Psych he appears grossly alert and oriented pleasant and appropriate apparently does have a history of mild memory loss--but appears quite functional   Labs.  05/19/2015.  Sodium 1-in 3.8 BUN 40 creatinine 2.12.  WBC 14.2 hemoglobin 11.1 platelets 149  05/14/2015.  WBC 6.4 hemoglobin 11.0 platelets 172.  Sodium 134 potassium 4.8 BUN 37 creatinine 1.65.  UTI positive for enterococcus greater than 100,000 colonies 03/28/2014.  WBC 9.6 hemoglobin 9.7 platelets 240.    Jan second 2017.  Sodium 137 potassium 4.6 BUN 56 creatinine 1.67.  WBC 11.9 hemoglobin 11.2 platelets 281  03/20/2015.  WBC 9.5 hemoglobin 10.3  .    03/15/2015.  WBC 10.9-hemoglobin 10.3-platelets 93,000.  Sodium 136 potassium 4.3 BUN 41 creatinine 1.64    Assessment and plan.  History of leukocytosis--one would be suspicious of C. difficile  with history of diarrhea and recent antibiotic use--will treat empirically with Flagyl 500 mg 3 times a day for 10 days and add a probiotic twice a day for an extended period of time.  Also will need to collect culture for C. difficile.  At this point continue the amoxicillin for UTI-this was discussed with Dr. Dellia Nims via phone.  #2 history of elevated creatinine from baseline-acute on chronic renal insufficiency-we'll give him a liter of normal saline at 60 mL an hour this could be due to the diarrhea-update a metabolic panel tomorrow as well as a CBC.  #3 cough-again x-ray has been negative for any acute process at this point continue the Mucinex and nebulizers and monitor he does not appear to be having any sign of respiratory distress or decompensation certainly at this point but this will have to be watched  #4-UTI-again will continue on amoxicillin for now as noted above.  [id number] note greater than 35 minutes spent assessing patient discussing his status with nursing staff-as well as with Dr. Dellia Nims via phone-and coordinating formulating a plan of care-of note greater than 50% of time spent coordinating plan of  care

## 2015-05-20 ENCOUNTER — Non-Acute Institutional Stay (SKILLED_NURSING_FACILITY): Payer: Medicare Other | Admitting: Internal Medicine

## 2015-05-20 DIAGNOSIS — N178 Other acute kidney failure: Secondary | ICD-10-CM

## 2015-05-20 DIAGNOSIS — A0472 Enterocolitis due to Clostridium difficile, not specified as recurrent: Secondary | ICD-10-CM

## 2015-05-20 DIAGNOSIS — I255 Ischemic cardiomyopathy: Secondary | ICD-10-CM

## 2015-05-20 DIAGNOSIS — A047 Enterocolitis due to Clostridium difficile: Secondary | ICD-10-CM | POA: Diagnosis not present

## 2015-05-20 LAB — C DIFFICILE QUICK SCREEN W PCR REFLEX
C DIFFICILE (CDIFF) TOXIN: POSITIVE — AB
C DIFFICLE (CDIFF) ANTIGEN: NEGATIVE

## 2015-05-20 NOTE — Progress Notes (Signed)
Patient ID: [name] [name], male   DOB: 05-20-1922, 82 y.o.   MRN: [id number]    Facility; [facility] Chief complaint follow-up diarrhea/pseudomembranous colitis. History; this is a gentleman who came to [place] after a right periprosthetic fracture at the end of December. He also has a cardiomyopathy with an ejection fraction of 35% and grade 2 diastolic dysfunction. Finally he has chronic renal failure stage III with a baseline creatinine of 1.7. He apparently started developing diarrhea early in the week. His stool persistent C. difficile toxin was positive. He was started on Flagyl at 500 3 times a day yesterday. He also received a liter of normal saline. Follow-up lab work as below  BMP Latest Ref Rng 05/19/2015 05/14/2015 05/12/2015  Glucose 65 - 99 mg/dL 107(H) 89 96  BUN 6 - 20 mg/dL 40(H) 37(H) 38.4(H)  Creatinine 0.61 - 1.24 mg/dL 2.12(H) 1.65(H) 1.7(H)  Sodium 135 - 145 mmol/L 133(L) 134(L) 136  Potassium 3.5 - 5.1 mmol/L 3.8 4.8 4.3  Chloride 101 - 111 mmol/L 102 101 -  CO2 22 - 32 mmol/L 20(L) 24 25  Calcium 8.9 - 10.3 mg/dL 7.2(L) 8.2(L) 8.6    CBC Latest Ref Rng 05/19/2015 05/14/2015 05/12/2015  WBC 4.0 - 10.5 K/uL 14.2(H) 6.4 6.7  Hemoglobin 13.0 - 17.0 g/dL 11.1(L) 11.0(L) 11.7(L)  Hematocrit 39.0 - 52.0 % 33.2(L) 32.8(L) 36.4(L)  Platelets 150 - 400 K/uL 149(L) 172 165    Past medical history  Cardiomyopathy with defibrillator in situ Hypothyroidism Hypertension Diffuse large B-cell lymphoma in remission Stage III chronic renal failure Hyperlipidemia Dementia Anemia secondary to chronic renal failure  Current medications Synthroid 100 a day Lipitor 10 daily Nitrostat when necessary Protonix 40 daily Toprolol XL 12.5 daily Vitamin C daily  Review of systems Gen. patient states he doesn't feel badly Respiratory no cough no shortness of breath Cardiac no chest pain GI when I specifically questioned the admitted to a large amount of diarrhea today but no abdominal pain  nausea or vomiting GU no dysuria.  On examination Gen. patient is not in any distress Respiratory air entry is reduced prolonged expiratory phase with expiratory wheezing. Cardiac heart sounds are normal he appears to be euvolemic Abdomen no liver spleen or tenderness GU no suprapubic or costovertebral angle tenderness.  Impression/plan Pseudomembranous colitis he is on Flagyl started yesterday. Cardiomyopathy with an ejection fraction of 35% there does not appear to be any heart failure. He appears to be euvolemic ? COPD. I have thought this when I seen this man previously. He is having expiratory wheezing all leave him a when necessary inhaler. Chronic renal failure; his creatinine was up to 2.12 yesterday he was given a liter of IV fluid he'll need lab work and follow-up. This was ordered for today however I don't think it's been done I'll put it on for tomorrow.

## 2015-05-21 LAB — CLOSTRIDIUM DIFFICILE BY PCR: CDIFFPCR: POSITIVE — AB

## 2015-05-22 ENCOUNTER — Telehealth: Payer: Self-pay | Admitting: Internal Medicine

## 2015-05-22 ENCOUNTER — Encounter (HOSPITAL_COMMUNITY)
Admission: RE | Admit: 2015-05-22 | Discharge: 2015-05-22 | Disposition: A | Discharge status: Home / Self Care | Payer: Medicare Other | Source: Skilled Nursing Facility | Attending: Internal Medicine | Admitting: Internal Medicine

## 2015-05-22 LAB — CBC WITH DIFFERENTIAL/PLATELET
Basophils Absolute: 0 10*3/uL (ref 0.0–0.1)
Basophils Relative: 0 %
EOS ABS: 0.5 10*3/uL (ref 0.0–0.7)
Eosinophils Relative: 5 %
HEMATOCRIT: 31.5 % — AB (ref 39.0–52.0)
HEMOGLOBIN: 10.6 g/dL — AB (ref 13.0–17.0)
LYMPHS ABS: 1.5 10*3/uL (ref 0.7–4.0)
LYMPHS PCT: 16 %
MCH: 30 pg (ref 26.0–34.0)
MCHC: 33.7 g/dL (ref 30.0–36.0)
MCV: 89.2 fL (ref 78.0–100.0)
MONOS PCT: 16 %
Monocytes Absolute: 1.5 10*3/uL — ABNORMAL HIGH (ref 0.1–1.0)
NEUTROS ABS: 5.6 10*3/uL (ref 1.7–7.7)
NEUTROS PCT: 63 %
Platelets: 146 10*3/uL — ABNORMAL LOW (ref 150–400)
RBC: 3.53 MIL/uL — AB (ref 4.22–5.81)
RDW: 14.2 % (ref 11.5–15.5)
WBC: 9 10*3/uL (ref 4.0–10.5)

## 2015-05-22 LAB — BASIC METABOLIC PANEL
Anion gap: 6 (ref 5–15)
BUN: 32 mg/dL — AB (ref 6–20)
CHLORIDE: 104 mmol/L (ref 101–111)
CO2: 26 mmol/L (ref 22–32)
Calcium: 7.6 mg/dL — ABNORMAL LOW (ref 8.9–10.3)
Creatinine, Ser: 1.61 mg/dL — ABNORMAL HIGH (ref 0.61–1.24)
GFR calc non Af Amer: 36 mL/min — ABNORMAL LOW (ref >60)
GFR, EST AFRICAN AMERICAN: 42 mL/min — AB (ref >60)
Glucose, Bld: 101 mg/dL — ABNORMAL HIGH (ref 65–99)
POTASSIUM: 4 mmol/L (ref 3.5–5.1)
SODIUM: 136 mmol/L (ref 135–145)

## 2015-05-22 NOTE — Telephone Encounter (Signed)
Pt to have  Pacer placement 05-26-15 and wants to know if labs done at the [facility] will work so he doesn't have to come back in, also he is "under the weather" and she is not sure he can even do the procedure-pls call dtr Ivin Booty @[phone]

## 2015-05-22 NOTE — Telephone Encounter (Signed)
Spoke with daughter and let her know that we would like for the diarrhea to have stopped before he has procedure.  He is currently afebrile but she will call me back if she feels they need to reschedule procedure

## 2015-05-24 ENCOUNTER — Telehealth: Payer: Self-pay | Admitting: Internal Medicine

## 2015-05-24 NOTE — Telephone Encounter (Signed)
New Message:  Pt's daughter called in stating that the pt's procedure will have to be reschedule because the pt has a CDiff . Please calll her back

## 2015-05-25 NOTE — Telephone Encounter (Signed)
Moved patient's case to 06/12/15 at 9:30am.  To be at the hospital at 7:30am.  Will draw labs at the hospital.

## 2015-05-30 ENCOUNTER — Inpatient Hospital Stay (HOSPITAL_COMMUNITY)
Admit: 2015-05-30 | Discharge: 2015-05-30 | Disposition: A | Discharge status: Home / Self Care | Payer: Medicare Other | Attending: Orthopedic Surgery | Admitting: Orthopedic Surgery

## 2015-05-30 ENCOUNTER — Encounter: Payer: Self-pay | Admitting: Orthopedic Surgery

## 2015-05-30 ENCOUNTER — Ambulatory Visit: Payer: Medicare Other | Admitting: Orthopedic Surgery

## 2015-05-30 VITALS — BP 111/55 | Ht 64.0 in | Wt 158.0 lb

## 2015-05-30 DIAGNOSIS — S72111A Displaced fracture of greater trochanter of right femur, initial encounter for closed fracture: Secondary | ICD-10-CM | POA: Diagnosis not present

## 2015-05-30 DIAGNOSIS — M9701XA Periprosthetic fracture around internal prosthetic right hip joint, initial encounter: Secondary | ICD-10-CM

## 2015-05-30 DIAGNOSIS — M9701XD Periprosthetic fracture around internal prosthetic right hip joint, subsequent encounter: Secondary | ICD-10-CM

## 2015-05-30 NOTE — Progress Notes (Signed)
Chief Complaint  Patient presents with  . Follow-up    follow up Right hip fx, adoi 03/11/15    Periprosthetic fracture right hip the fracture at the trochanteric region has healed. The prosthesis is stable.  He has normal ROM in the right hip  No pain   C/o lower back pain x 3 days no leg pain no radiation   Ambulates with walker pre and post injury   X-ray was done at the hospital shows a stable hip with a healed fracture type a around the trochanter.  Advised to call us if back pain persists

## 2015-05-31 ENCOUNTER — Non-Acute Institutional Stay (SKILLED_NURSING_FACILITY): Payer: Medicare Other | Admitting: Internal Medicine

## 2015-05-31 DIAGNOSIS — S72141D Displaced intertrochanteric fracture of right femur, subsequent encounter for closed fracture with routine healing: Secondary | ICD-10-CM | POA: Diagnosis not present

## 2015-05-31 DIAGNOSIS — I5043 Acute on chronic combined systolic (congestive) and diastolic (congestive) heart failure: Secondary | ICD-10-CM

## 2015-05-31 DIAGNOSIS — N189 Chronic kidney disease, unspecified: Secondary | ICD-10-CM

## 2015-05-31 DIAGNOSIS — Z8719 Personal history of other diseases of the digestive system: Secondary | ICD-10-CM | POA: Diagnosis not present

## 2015-05-31 DIAGNOSIS — IMO0001 Reserved for inherently not codable concepts without codable children: Secondary | ICD-10-CM

## 2015-05-31 NOTE — Progress Notes (Signed)
Patient ID: [name] [name], male   DOB: 10-14-1922, 82 y.o.   MRN: [id number]        This is a discharge note.   Level care skilled.  Facility [facility].  Chief complaint--discharge note    .  Acute visit follow-up cough   i History of present illness.  Patient is a pleasant 82 year old male who is been here previously he has a history of dementia combined CHF-chronic kidney disease stage III. among other issues as stated above  He presented to the emergency room status post fall and was found to have an acute or prostatic fracture of the right proximal femur.  This was managed per ortho  nonoperatively. Appears to be stable he has worked with rehabilitation and will need continued PT and OT at home-he does live by himself but has a very supportive family nearby-and is quite adamant he wants to go home  He appears to be ambulating fairly well with a walker currently  His course here was somewhat complicated with the UTI which was treated with amoxicillin-he also developed C. difficile and has completed a course of Flagyl-he had some acute on chronic renal insufficiency did receive a short course of IV fluids and this responded quite quickly back to his baseline creatinine of around 1.6.  Diarrhea has resolved      Regards to his combined systolic and diastolic CHF status post automatic implantable cardioverter defibrillator-this appears to be stable and compensated.--Weights appear to be relatively stable He is scheduled to have a replacement    . He also has a history of lymphoma B-cell-recent PET scan did not show evidence of active disease he has follow-up scheduled in approximately 6 months per oncology note.        Previous medical history.  Fracture proximal end of right femur.  Hypothyroidism.  Hypertension.  Memory loss.  Automatic implantable cardioverter defibrillator.  Anemia chronic renal disease.  Cytopenia.  Diffuse large B-cell  lymphoma.  Dyslipidemia.  Leukocytosis.  Chronic combined systolic and diastolic CHF.  Chronic kidney disease stage III.  Acute respiratory failure with hypoxia.  Right femoral fracture closed.  A history of pressure ulcer.  Osteoporosis.  History of sternal fracture.  History of seizures.  BPH.  Surgical history.  History of appendectomy.  History pacemaker insertion.  Inguinal hernia repair.  Right hip fracture repair.  .  Social history patient lives alone was ambulating independently.  He quit smoking 38 years ago-no history of smokeless tobacco alcohol or illicit drug use.  Family history is noncontributory  Medications.  Tylenol 5 mg every 6 hours when necessary.  Albuterol nebulizers every 6 hours when necessary.  Atorvastatin 10 mg twice a day.  Vitamin D thousand units daily.  Diltiazem 2% gel apply one up patient topically 3 times a day daily to rectum.  Colace 100 mg daily when necessary.  Aricept 10 mg daily at bedtime.  Flonase 2 sprays both nostrils daily.  Folic acid A999333 mg daily.  Synthroid 100 g daily.  Robaxin 500 mg every 6 hours when necessary.  Toprol-XL 12.5 mg twice a day.  Nitroglycerin 0.4 mg every 5 minutes when necessary chest pain 3.  Protonix 40 mg daily.  Triamcinolone nasal inhaler 1 spray into nose daily.  Vicks DayQuil when necessary take 2 capsules as needed daily.  Vitamin C daily.  Vitamin E daily  Review of systems. General does not complaining of any fever or chills.  Skin is not complaining of rashes or itching.  Head ears eyes nose mouth and throat-does not complain of visual changes or sore throat or nasal discharge.  Respiratory ---does not complain of shortness breath -did have some cough and congestion earlier in his stay chest x-ray was negative for any acute process he did receive a short course of Avelox  Cardiac does not complain of chest pain does have a history of chronic  combined CHF. It appears he is scheduled  for ICD replacement  GI is not complaining of any abdominal pain nausea vomiting diarrhea or constipation.--  GU does not complain of dysuria--u has completed antibiotic for UTI  Musculoskeletal-  At this point hip pain appears to be relatively well controlled   Neurologic is not complaining of dizziness headache or numbness.  Ipsych has a diagnosis of memory loss .  Physical exam.  Temperature 97.8 pulse 61 respirations 18 blood pressure 124/61 weight appears relatively stable at 156.4   In general this is a pleasant elderly male who looks younger than his stated age--  His skin is warm and dry.  Eyes visual acuity appears to be grossly intact pupils are reactive to light sclerae and conjunctivae clear.  Oropharynx clear mucous membranes moist.  Chest is clear to auscultation there is no labored breathing. Pacemaker defibrillator site on left side of thorax  Heart is regular rate and rhythm without murmur gallop or rub-he does have some mild right lower extremity edema  Abdomen is soft nontender with positive bowel sounds.  GU  could not really appreciate any suprapubic tenderness    Muscle skeletal-- She relates fairly well with a rolling walker I did not note any deformities again he does have some mild right lower extremity edema Upper extremity strength appears preserved .  Neurologic is grossly intact to speech is clear no lateralizing findings.  Psych he appears grossly alert and oriented pleasant and appropriate apparently does have a history of mild memory loss--but appears quite functional   Labs.  05/22/2015.  WBC 9.0 hemoglobin 10.6 platelets 146.  Sodium 136 potassium 4 BUN 32 creatinine 1.61  05/14/2015.  WBC 6.4 hemoglobin 11.0 platelets 172.  Sodium 134 potassium 4.8 BUN 37 creatinine 1.65.  UTI positive for enterococcus greater than 100,000 colonies 03/28/2014.  WBC 9.6 hemoglobin 9.7 platelets  240.    Jan second 2017.  Sodium 137 potassium 4.6 BUN 56 creatinine 1.67.  WBC 11.9 hemoglobin 11.2 platelets 281  03/20/2015.  WBC 9.5 hemoglobin 10.3  .    03/15/2015.  WBC 10.9-hemoglobin 10.3-platelets 93,000.  Sodium 136 potassium 4.3 BUN 41 creatinine 1.64    Assessment and plan.     #1-history of right hip fracture this appears stable he is now ambulating with a rolling walker pain appears to be controlled-he will need continued PT and OT at home as well as orthopedic follow-up-he has all the equipment he needs at home-follow-up x-ray has shown it is healing unremarkably he is weightbearing as tolerated with a rolling walker .  #2-history of cardiomyopathy with ejection fraction in 2014 of 35% with grade 2 diastolic dysfunction--his weight has been baseline since his arrival here He does have a pacemaker/defibrillator in place and this is followed closely by cardiology  I do note some continued right leg edema will order a venous Doppler before discharge to rule out DVT  I suspect likelihood of this is fairly low  .  #3-history of chronic renal failure stage III this appears stable with a creatinine of 1.61 on most recent lab will update before  discharge  #4 history of dementia this appears to be quite mild he is on Aricept  .  #5history of hypothyroidism he is on Synthroid--appears he has some history of elevated TSH is in the past most recently 7.513 on lab done in late January-however free T4 at that time was within normal range at 1.07--will defer follow-up to primary care provider  #6 hyperlipidemia-he continues on a statin \\since  his stay here was quite short was not aggressive pursueing a lipid panel  #7 history of B-cell lymphoma this is followed again it appears by oncology follow-up in approximately 6 months per review of oncology note most recent PET scan did not show evidence of active disease.  #8-history C-diff this appears to be asymptomatic  and resolved diarrhea has resolved white count has normalized. 9 #History UTI-he has completed treatment currently asymptomatic   [id number]-of note greater than 30 minutes spent on this discharge summary-greaterr than 50% of time spent coordinating plan of care for numerous diagnoses

## 2015-06-01 ENCOUNTER — Encounter (HOSPITAL_COMMUNITY)
Admission: AD | Admit: 2015-06-01 | Discharge: 2015-06-01 | Disposition: A | Discharge status: Home / Self Care | Payer: Medicare Other | Source: Skilled Nursing Facility | Attending: Internal Medicine | Admitting: Internal Medicine

## 2015-06-01 ENCOUNTER — Ambulatory Visit (HOSPITAL_COMMUNITY)
Admit: 2015-06-01 | Discharge: 2015-06-01 | Disposition: A | Discharge status: Home / Self Care | Payer: Medicare Other | Attending: Internal Medicine | Admitting: Internal Medicine

## 2015-06-01 DIAGNOSIS — R609 Edema, unspecified: Secondary | ICD-10-CM | POA: Diagnosis not present

## 2015-06-01 DIAGNOSIS — R600 Localized edema: Secondary | ICD-10-CM | POA: Diagnosis not present

## 2015-06-01 DIAGNOSIS — M7989 Other specified soft tissue disorders: Secondary | ICD-10-CM | POA: Diagnosis not present

## 2015-06-01 LAB — CBC WITH DIFFERENTIAL/PLATELET
Basophils Absolute: 0.1 10*3/uL (ref 0.0–0.1)
Basophils Relative: 1 %
Eosinophils Absolute: 0.7 10*3/uL (ref 0.0–0.7)
Eosinophils Relative: 7 %
HEMATOCRIT: 33.7 % — AB (ref 39.0–52.0)
HEMOGLOBIN: 11.5 g/dL — AB (ref 13.0–17.0)
LYMPHS ABS: 1.3 10*3/uL (ref 0.7–4.0)
Lymphocytes Relative: 14 %
MCH: 29.7 pg (ref 26.0–34.0)
MCHC: 34.1 g/dL (ref 30.0–36.0)
MCV: 87.1 fL (ref 78.0–100.0)
MONO ABS: 2.6 10*3/uL — AB (ref 0.1–1.0)
MONOS PCT: 27 %
NEUTROS ABS: 5 10*3/uL (ref 1.7–7.7)
NEUTROS PCT: 51 %
Platelets: 204 10*3/uL (ref 150–400)
RBC: 3.87 MIL/uL — ABNORMAL LOW (ref 4.22–5.81)
RDW: 15 % (ref 11.5–15.5)
WBC: 9.7 10*3/uL (ref 4.0–10.5)

## 2015-06-01 LAB — BASIC METABOLIC PANEL
Anion gap: 7 (ref 5–15)
BUN: 28 mg/dL — AB (ref 6–20)
CHLORIDE: 100 mmol/L — AB (ref 101–111)
CO2: 25 mmol/L (ref 22–32)
CREATININE: 1.52 mg/dL — AB (ref 0.61–1.24)
Calcium: 8.2 mg/dL — ABNORMAL LOW (ref 8.9–10.3)
GFR calc Af Amer: 45 mL/min — ABNORMAL LOW (ref >60)
GFR calc non Af Amer: 39 mL/min — ABNORMAL LOW (ref >60)
GLUCOSE: 99 mg/dL (ref 65–99)
Potassium: 4.1 mmol/L (ref 3.5–5.1)
Sodium: 132 mmol/L — ABNORMAL LOW (ref 135–145)

## 2015-06-04 DIAGNOSIS — E039 Hypothyroidism, unspecified: Secondary | ICD-10-CM | POA: Diagnosis not present

## 2015-06-04 DIAGNOSIS — Z8572 Personal history of non-Hodgkin lymphomas: Secondary | ICD-10-CM | POA: Diagnosis not present

## 2015-06-04 DIAGNOSIS — E785 Hyperlipidemia, unspecified: Secondary | ICD-10-CM | POA: Diagnosis not present

## 2015-06-04 DIAGNOSIS — M9701XD Periprosthetic fracture around internal prosthetic right hip joint, subsequent encounter: Secondary | ICD-10-CM | POA: Diagnosis not present

## 2015-06-04 DIAGNOSIS — I129 Hypertensive chronic kidney disease with stage 1 through stage 4 chronic kidney disease, or unspecified chronic kidney disease: Secondary | ICD-10-CM | POA: Diagnosis not present

## 2015-06-04 DIAGNOSIS — D631 Anemia in chronic kidney disease: Secondary | ICD-10-CM | POA: Diagnosis not present

## 2015-06-04 DIAGNOSIS — I5042 Chronic combined systolic (congestive) and diastolic (congestive) heart failure: Secondary | ICD-10-CM | POA: Diagnosis not present

## 2015-06-04 DIAGNOSIS — N183 Chronic kidney disease, stage 3 (moderate): Secondary | ICD-10-CM | POA: Diagnosis not present

## 2015-06-04 DIAGNOSIS — Z95810 Presence of automatic (implantable) cardiac defibrillator: Secondary | ICD-10-CM | POA: Diagnosis not present

## 2015-06-05 DIAGNOSIS — E785 Hyperlipidemia, unspecified: Secondary | ICD-10-CM | POA: Diagnosis not present

## 2015-06-05 DIAGNOSIS — I129 Hypertensive chronic kidney disease with stage 1 through stage 4 chronic kidney disease, or unspecified chronic kidney disease: Secondary | ICD-10-CM | POA: Diagnosis not present

## 2015-06-05 DIAGNOSIS — N183 Chronic kidney disease, stage 3 (moderate): Secondary | ICD-10-CM | POA: Diagnosis not present

## 2015-06-05 DIAGNOSIS — M9701XD Periprosthetic fracture around internal prosthetic right hip joint, subsequent encounter: Secondary | ICD-10-CM | POA: Diagnosis not present

## 2015-06-05 DIAGNOSIS — E039 Hypothyroidism, unspecified: Secondary | ICD-10-CM | POA: Diagnosis not present

## 2015-06-05 DIAGNOSIS — I5042 Chronic combined systolic (congestive) and diastolic (congestive) heart failure: Secondary | ICD-10-CM | POA: Diagnosis not present

## 2015-06-06 DIAGNOSIS — M9701XD Periprosthetic fracture around internal prosthetic right hip joint, subsequent encounter: Secondary | ICD-10-CM | POA: Diagnosis not present

## 2015-06-06 DIAGNOSIS — E039 Hypothyroidism, unspecified: Secondary | ICD-10-CM | POA: Diagnosis not present

## 2015-06-06 DIAGNOSIS — N183 Chronic kidney disease, stage 3 (moderate): Secondary | ICD-10-CM | POA: Diagnosis not present

## 2015-06-06 DIAGNOSIS — I129 Hypertensive chronic kidney disease with stage 1 through stage 4 chronic kidney disease, or unspecified chronic kidney disease: Secondary | ICD-10-CM | POA: Diagnosis not present

## 2015-06-06 DIAGNOSIS — I5042 Chronic combined systolic (congestive) and diastolic (congestive) heart failure: Secondary | ICD-10-CM | POA: Diagnosis not present

## 2015-06-06 DIAGNOSIS — E785 Hyperlipidemia, unspecified: Secondary | ICD-10-CM | POA: Diagnosis not present

## 2015-06-07 ENCOUNTER — Ambulatory Visit (INDEPENDENT_AMBULATORY_CARE_PROVIDER_SITE_OTHER): Payer: Medicare Other | Admitting: Internal Medicine

## 2015-06-07 ENCOUNTER — Encounter: Payer: Self-pay | Admitting: Internal Medicine

## 2015-06-07 ENCOUNTER — Other Ambulatory Visit (INDEPENDENT_AMBULATORY_CARE_PROVIDER_SITE_OTHER): Payer: Medicare Other

## 2015-06-07 ENCOUNTER — Ambulatory Visit (INDEPENDENT_AMBULATORY_CARE_PROVIDER_SITE_OTHER)
Admission: RE | Admit: 2015-06-07 | Discharge: 2015-06-07 | Disposition: A | Discharge status: Home / Self Care | Payer: Medicare Other | Source: Ambulatory Visit | Attending: Internal Medicine | Admitting: Internal Medicine

## 2015-06-07 VITALS — BP 142/72 | HR 66 | Temp 97.5°F | Wt 158.0 lb

## 2015-06-07 DIAGNOSIS — Z8572 Personal history of non-Hodgkin lymphomas: Secondary | ICD-10-CM

## 2015-06-07 DIAGNOSIS — M5440 Lumbago with sciatica, unspecified side: Secondary | ICD-10-CM | POA: Diagnosis not present

## 2015-06-07 DIAGNOSIS — R197 Diarrhea, unspecified: Secondary | ICD-10-CM | POA: Diagnosis not present

## 2015-06-07 DIAGNOSIS — G8929 Other chronic pain: Secondary | ICD-10-CM

## 2015-06-07 DIAGNOSIS — K624 Stenosis of anus and rectum: Secondary | ICD-10-CM | POA: Diagnosis not present

## 2015-06-07 DIAGNOSIS — I10 Essential (primary) hypertension: Secondary | ICD-10-CM

## 2015-06-07 DIAGNOSIS — I255 Ischemic cardiomyopathy: Secondary | ICD-10-CM

## 2015-06-07 DIAGNOSIS — N189 Chronic kidney disease, unspecified: Secondary | ICD-10-CM

## 2015-06-07 DIAGNOSIS — R413 Other amnesia: Secondary | ICD-10-CM

## 2015-06-07 DIAGNOSIS — D631 Anemia in chronic kidney disease: Secondary | ICD-10-CM

## 2015-06-07 DIAGNOSIS — M545 Low back pain: Secondary | ICD-10-CM | POA: Diagnosis not present

## 2015-06-07 LAB — CBC WITH DIFFERENTIAL/PLATELET
BASOS PCT: 0.6 % (ref 0.0–3.0)
EOS PCT: 2.9 % (ref 0.0–5.0)
HCT: 35.5 % — ABNORMAL LOW (ref 39.0–52.0)
Hemoglobin: 11.8 g/dL — ABNORMAL LOW (ref 13.0–17.0)
LYMPHS PCT: 10.8 % — AB (ref 12.0–46.0)
MCHC: 33.1 g/dL (ref 30.0–36.0)
MCV: 88.8 fl (ref 78.0–100.0)
Monocytes Relative: 20.9 % — ABNORMAL HIGH (ref 3.0–12.0)
NEUTROS PCT: 64.8 % (ref 43.0–77.0)
PLATELETS: 242 10*3/uL (ref 150.0–400.0)
RBC: 3.99 Mil/uL — ABNORMAL LOW (ref 4.22–5.81)
RDW: 15.6 % — AB (ref 11.5–15.5)
WBC: 12.2 10*3/uL — AB (ref 4.0–10.5)

## 2015-06-07 LAB — BASIC METABOLIC PANEL
BUN: 35 mg/dL — ABNORMAL HIGH (ref 6–23)
CHLORIDE: 100 meq/L (ref 96–112)
CO2: 25 mEq/L (ref 19–32)
Calcium: 8.7 mg/dL (ref 8.4–10.5)
Creatinine, Ser: 1.57 mg/dL — ABNORMAL HIGH (ref 0.40–1.50)
GFR: 44.26 mL/min — AB (ref >60.00)
Glucose, Bld: 104 mg/dL — ABNORMAL HIGH (ref 70–99)
POTASSIUM: 4.3 meq/L (ref 3.5–5.1)
SODIUM: 131 meq/L — AB (ref 135–145)

## 2015-06-07 LAB — URINALYSIS
BILIRUBIN URINE: NEGATIVE
HGB URINE DIPSTICK: NEGATIVE
Ketones, ur: NEGATIVE
LEUKOCYTES UA: NEGATIVE
NITRITE: NEGATIVE
Specific Gravity, Urine: <=1.005 — AB (ref 1.000–1.030)
Total Protein, Urine: NEGATIVE
UROBILINOGEN UA: 0.2 (ref 0.0–1.0)
Urine Glucose: NEGATIVE
pH: 6 (ref 5.0–8.0)

## 2015-06-07 LAB — TSH: TSH: 6.2 u[IU]/mL — AB (ref 0.35–4.50)

## 2015-06-07 MED ORDER — DIPHENOXYLATE-ATROPINE 2.5-0.025 MG PO TABS: 1.0000 | ORAL_TABLET | Freq: Four times a day (QID) | ORAL | Status: AC | PRN

## 2015-06-07 MED ORDER — AMBULATORY NON FORMULARY MEDICATION: Status: AC

## 2015-06-07 MED ORDER — PANTOPRAZOLE SODIUM 40 MG PO TBEC: 40.0000 mg | DELAYED_RELEASE_TABLET | Freq: Every day | ORAL | Status: DC

## 2015-06-07 MED ORDER — SACCHAROMYCES BOULARDII 250 MG PO CAPS: 250.0000 mg | ORAL_CAPSULE | Freq: Two times a day (BID) | ORAL | Status: DC

## 2015-06-07 NOTE — Patient Instructions (Addendum)
   Milk free trial (no milk, ice cream, cheese and yogurt) for 4-6 weeks.  OK to use almond, coconut, rice milk. "Almond breeze" brand tastes good.   Hold Colace, Aricept (Donopezil)   No driving!  Let the family set your meds please

## 2015-06-07 NOTE — Assessment & Plan Note (Addendum)
3/17 C diff - treated Diarrhea - hold Aricept, Colace Avoid milk Florastor Lomotil

## 2015-06-07 NOTE — Assessment & Plan Note (Addendum)
Worse Recurrent compr fx's Dr Nelva [facility] X ray

## 2015-06-07 NOTE — Progress Notes (Signed)
Subjective:  Patient ID: [name] [name], male    DOB: 03/10/1923  Age: 82 y.o. MRN: [id number]  CC: No chief complaint on file.   HPI [name] [name] presents for GERD, dyslipidemia, hypothyroidism f/u. C/o LBP x 1 week - severe C/o diarrhea x 1 mo (C diff). Better -worse at home: up to 4/d  Outpatient Prescriptions Prior to Visit  Medication Sig Dispense Refill  . acetaminophen (TYLENOL) 500 MG tablet Take 500 mg by mouth every 6 (six) hours as needed for headache.    . albuterol (PROVENTIL) (2.5 MG/3ML) 0.083% nebulizer solution Take 3 mLs (2.5 mg total) by nebulization every 6 (six) hours as needed for wheezing or shortness of breath. 375 mL 1  . Artificial Tear Ointment (DRY EYES OP) Apply 1 drop to eye daily.    . Ascorbic Acid (VITAMIN C PO) Take 1 tablet by mouth daily.    Marland Kitchen atorvastatin (LIPITOR) 20 MG tablet TAKE 1/2 TABLET TWICE DAILY 90 tablet 2  . Cholecalciferol (VITAMIN D3) 1000 UNITS tablet Take 1,000 Units by mouth daily.      Marland Kitchen docusate sodium (COLACE) 100 MG capsule Take 1 capsule (100 mg total) by mouth daily as needed for mild constipation. 10 capsule 0  . donepezil (ARICEPT) 10 MG tablet TAKE 1 TABLET AT BEDTIME 90 tablet 3  . fluticasone (FLONASE) 50 MCG/ACT nasal spray Place 2 sprays into both nostrils daily. 48 g 3  . folic acid (FOLVITE) Q000111Q MCG tablet Take 400 mcg by mouth daily.     Marland Kitchen levothyroxine (SYNTHROID, LEVOTHROID) 100 MCG tablet Take 1 tablet (100 mcg total) by mouth daily. 90 tablet 3  . metoprolol succinate (TOPROL-XL) 25 MG 24 hr tablet Take 0.5 tablets (12.5 mg total) by mouth 2 (two) times daily. 90 tablet 3  . nitroGLYCERIN (NITROSTAT) 0.4 MG SL tablet Place 1 tablet (0.4 mg total) under the tongue every 5 (five) minutes as needed. Chest pain 20 tablet 2  . pantoprazole (PROTONIX) 40 MG tablet Take 1 tablet (40 mg total) by mouth daily. (Patient taking differently: Take 40 mg by mouth 2 (two) times daily. ) 90 tablet 3  . triamcinolone (NASACORT)  55 MCG/ACT nasal inhaler Place 1 spray into the nose daily. 3 Inhaler 1  . VITAMIN E PO Take 1 capsule by mouth daily.     No facility-administered medications prior to visit.    ROS Review of Systems  Constitutional: Positive for fatigue. Negative for appetite change and unexpected weight change.  HENT: Negative for congestion, nosebleeds, sneezing, sore throat and trouble swallowing.   Eyes: Negative for itching and visual disturbance.  Respiratory: Negative for cough.   Cardiovascular: Negative for chest pain, palpitations and leg swelling.  Gastrointestinal: Negative for nausea, diarrhea, blood in stool and abdominal distention.  Genitourinary: Negative for frequency and hematuria.  Musculoskeletal: Positive for back pain and gait problem. Negative for joint swelling and neck pain.  Skin: Negative for rash.  Neurological: Negative for dizziness, tremors, speech difficulty and weakness.  Psychiatric/Behavioral: Negative for sleep disturbance, dysphoric mood and agitation. The patient is not nervous/anxious.     Objective:  BP 142/72 mmHg  Pulse 66  Temp(Src) 97.5 F (36.4 C) (Oral)  Wt 158 lb (71.668 kg)  SpO2 96%  BP Readings from Last 3 Encounters:  06/07/15 142/72  06/04/15 124/61  05/30/15 111/55    Wt Readings from Last 3 Encounters:  06/07/15 158 lb (71.668 kg)  05/30/15 158 lb (71.668 kg)  05/12/15 158 lb  14.4 oz (72.077 kg)    Physical Exam  Constitutional: He is oriented to person, place, and time. He appears well-developed. No distress.  NAD  HENT:  Mouth/Throat: Oropharynx is clear and moist.  Eyes: Conjunctivae are normal. Pupils are equal, round, and reactive to light.  Neck: Normal range of motion. No JVD present. No thyromegaly present.  Cardiovascular: Normal rate, regular rhythm, normal heart sounds and intact distal pulses.  Exam reveals no gallop and no friction rub.   No murmur heard. Pulmonary/Chest: Effort normal and breath sounds normal. No  respiratory distress. He has no wheezes. He has no rales. He exhibits no tenderness.  Abdominal: Soft. Bowel sounds are normal. He exhibits no distension and no mass. There is no tenderness. There is no rebound and no guarding.  Musculoskeletal: Normal range of motion. He exhibits no edema or tenderness.  Lymphadenopathy:    He has no cervical adenopathy.  Neurological: He is alert and oriented to person, place, and time. He has normal reflexes. No cranial nerve deficit. He exhibits normal muscle tone. He displays a negative Romberg sign. Coordination and gait normal.  Skin: Skin is warm and dry. No rash noted.  Psychiatric: He has a normal mood and affect. His behavior is normal. Judgment and thought content normal.    Lab Results  Component Value Date   WBC 9.7 06/01/2015   HGB 11.5* 06/01/2015   HCT 33.7* 06/01/2015   PLT 204 06/01/2015   GLUCOSE 99 06/01/2015   CHOL 110 05/25/2013   TRIG 67.0 05/25/2013   HDL 40.30 05/25/2013   LDLCALC 56 05/25/2013   ALT 12 05/12/2015   AST 16 05/12/2015   NA 132* 06/01/2015   K 4.1 06/01/2015   CL 100* 06/01/2015   CREATININE 1.52* 06/01/2015   BUN 28* 06/01/2015   CO2 25 06/01/2015   TSH 7.513* 03/27/2015   PSA 3.42 11/28/2011   INR 1.0 09/27/2014    US Venous Img Lower Unilateral Right  06/01/2015  CLINICAL DATA:  Edema, swelling since fall 2016. Hip fracture. Pain. EXAM: RIGHT LOWER EXTREMITY VENOUS DOPPLER ULTRASOUND TECHNIQUE: Gray-scale sonography with compression, as well as color and duplex ultrasound, were performed to evaluate the deep venous system from the level of the common femoral vein through the popliteal and proximal calf veins. COMPARISON:  None FINDINGS: Normal compressibility of the common femoral, superficial femoral, and popliteal veins, as well as the proximal calf veins. No filling defects to suggest DVT on grayscale or color Doppler imaging. Doppler waveforms show normal direction of venous flow, normal respiratory  phasicity and response to augmentation. Survey views of the contralateral common femoral vein are unremarkable. IMPRESSION: 1. No evidence of lower extremity deep vein thrombosis, RIGHT. Electronically Signed   By: Lucrezia Europe M.D.   On: 06/01/2015 14:07    Assessment & Plan:   Diagnoses and all orders for this visit:  Stenosis, anal canal -     AMBULATORY NON FORMULARY MEDICATION; Diltiazem gel 2% Apply small amount pea-sized to anal area 2-3 times a day for anal stenosis. Use as needed.  Other orders -     pantoprazole (PROTONIX) 40 MG tablet; Take 1 tablet (40 mg total) by mouth daily.   I am having [name] [name] maintain his folic acid, cholecalciferol, triamcinolone, Ascorbic Acid (VITAMIN C PO), donepezil, fluticasone, nitroGLYCERIN, pantoprazole, levothyroxine, atorvastatin, metoprolol succinate, albuterol, acetaminophen, VITAMIN E PO, Artificial Tear Ointment (DRY EYES OP), docusate sodium, and Probiotic Product (PROBIOTIC DAILY PO).  Meds ordered this encounter  Medications  . Probiotic Product (PROBIOTIC DAILY PO)    Sig: Take by mouth 2 (two) times daily.     Follow-up: No Follow-up on file.  Walker Kehr, MD

## 2015-06-07 NOTE — Progress Notes (Signed)
Pre visit review using our clinic review tool, if applicable. No additional management support is needed unless otherwise documented below in the visit note. 

## 2015-06-08 ENCOUNTER — Ambulatory Visit: Payer: Medicare Other

## 2015-06-08 DIAGNOSIS — E785 Hyperlipidemia, unspecified: Secondary | ICD-10-CM | POA: Diagnosis not present

## 2015-06-08 DIAGNOSIS — I5042 Chronic combined systolic (congestive) and diastolic (congestive) heart failure: Secondary | ICD-10-CM | POA: Diagnosis not present

## 2015-06-08 DIAGNOSIS — M9701XD Periprosthetic fracture around internal prosthetic right hip joint, subsequent encounter: Secondary | ICD-10-CM | POA: Diagnosis not present

## 2015-06-08 DIAGNOSIS — E039 Hypothyroidism, unspecified: Secondary | ICD-10-CM | POA: Diagnosis not present

## 2015-06-08 DIAGNOSIS — I129 Hypertensive chronic kidney disease with stage 1 through stage 4 chronic kidney disease, or unspecified chronic kidney disease: Secondary | ICD-10-CM | POA: Diagnosis not present

## 2015-06-08 DIAGNOSIS — N183 Chronic kidney disease, stage 3 (moderate): Secondary | ICD-10-CM | POA: Diagnosis not present

## 2015-06-08 MED ORDER — LEVOTHYROXINE SODIUM 112 MCG PO TABS: 112.0000 ug | ORAL_TABLET | Freq: Every day | ORAL | Status: DC

## 2015-06-08 NOTE — Assessment & Plan Note (Signed)
On Metoprolol 

## 2015-06-08 NOTE — Assessment & Plan Note (Signed)
No driving Assistance at home - family is very supportive Pt is not interested in moving to an assisted living setting

## 2015-06-08 NOTE — Assessment & Plan Note (Signed)
Per Oncology.   ?

## 2015-06-08 NOTE — Assessment & Plan Note (Signed)
Monitoring CBC 

## 2015-06-09 ENCOUNTER — Telehealth: Payer: Self-pay | Admitting: *Deleted

## 2015-06-09 DIAGNOSIS — I129 Hypertensive chronic kidney disease with stage 1 through stage 4 chronic kidney disease, or unspecified chronic kidney disease: Secondary | ICD-10-CM | POA: Diagnosis not present

## 2015-06-09 DIAGNOSIS — E785 Hyperlipidemia, unspecified: Secondary | ICD-10-CM | POA: Diagnosis not present

## 2015-06-09 DIAGNOSIS — I5042 Chronic combined systolic (congestive) and diastolic (congestive) heart failure: Secondary | ICD-10-CM | POA: Diagnosis not present

## 2015-06-09 DIAGNOSIS — M9701XD Periprosthetic fracture around internal prosthetic right hip joint, subsequent encounter: Secondary | ICD-10-CM | POA: Diagnosis not present

## 2015-06-09 DIAGNOSIS — E039 Hypothyroidism, unspecified: Secondary | ICD-10-CM | POA: Diagnosis not present

## 2015-06-09 DIAGNOSIS — N183 Chronic kidney disease, stage 3 (moderate): Secondary | ICD-10-CM | POA: Diagnosis not present

## 2015-06-09 NOTE — Telephone Encounter (Signed)
Patient calls in asking about information on his procedure on Monday 3/20. Reviewed instructions with patient. Advised to arrive a [facility] hospital at 7:30 a.m. Labs to be drawn at the hospital. Patient verbalized understanding and agreeable to plan.

## 2015-06-11 MED ORDER — SODIUM CHLORIDE 0.9 % IR SOLN
80.0000 mg | Status: AC
  Administered 2015-06-12: 80 mg
  Filled 2015-06-11: qty 2

## 2015-06-12 ENCOUNTER — Telehealth: Payer: Self-pay | Admitting: Internal Medicine

## 2015-06-12 ENCOUNTER — Ambulatory Visit (HOSPITAL_COMMUNITY)
Admission: RE | Admit: 2015-06-12 | Discharge: 2015-06-12 | Disposition: A | Discharge status: Home / Self Care | Payer: Medicare Other | Source: Ambulatory Visit | Attending: Internal Medicine | Admitting: Internal Medicine

## 2015-06-12 ENCOUNTER — Encounter (HOSPITAL_COMMUNITY)
Admission: RE | Disposition: A | Discharge status: Home / Self Care | Payer: Self-pay | Source: Ambulatory Visit | Attending: Internal Medicine

## 2015-06-12 DIAGNOSIS — I255 Ischemic cardiomyopathy: Secondary | ICD-10-CM | POA: Insufficient documentation

## 2015-06-12 DIAGNOSIS — Z87891 Personal history of nicotine dependence: Secondary | ICD-10-CM | POA: Diagnosis not present

## 2015-06-12 DIAGNOSIS — Z45010 Encounter for checking and testing of cardiac pacemaker pulse generator [battery]: Secondary | ICD-10-CM | POA: Insufficient documentation

## 2015-06-12 DIAGNOSIS — Z8249 Family history of ischemic heart disease and other diseases of the circulatory system: Secondary | ICD-10-CM | POA: Diagnosis not present

## 2015-06-12 DIAGNOSIS — I495 Sick sinus syndrome: Secondary | ICD-10-CM | POA: Diagnosis not present

## 2015-06-12 DIAGNOSIS — I5022 Chronic systolic (congestive) heart failure: Secondary | ICD-10-CM | POA: Insufficient documentation

## 2015-06-12 HISTORY — PX: EP IMPLANTABLE DEVICE: SHX172B

## 2015-06-12 LAB — SURGICAL PCR SCREEN
MRSA, PCR: POSITIVE — AB
STAPHYLOCOCCUS AUREUS: POSITIVE — AB

## 2015-06-12 SURGERY — PACEMAKER IMPLANT
Anesthesia: LOCAL

## 2015-06-12 MED ORDER — LIDOCAINE HCL (PF) 1 % IJ SOLN
INTRAMUSCULAR | Status: AC
  Filled 2015-06-12: qty 60

## 2015-06-12 MED ORDER — ACETAMINOPHEN 325 MG PO TABS: 325.0000 mg | ORAL_TABLET | ORAL | Status: DC | PRN

## 2015-06-12 MED ORDER — MIDAZOLAM HCL 5 MG/5ML IJ SOLN
INTRAMUSCULAR | Status: DC | PRN
  Administered 2015-06-12: 1 mg via INTRAVENOUS

## 2015-06-12 MED ORDER — CEFAZOLIN SODIUM-DEXTROSE 2-3 GM-% IV SOLR
2.0000 g | INTRAVENOUS | Status: AC
  Administered 2015-06-12: 2 g via INTRAVENOUS

## 2015-06-12 MED ORDER — SODIUM CHLORIDE 0.9 % IV SOLN
INTRAVENOUS | Status: DC
  Administered 2015-06-12: 09:00:00 via INTRAVENOUS

## 2015-06-12 MED ORDER — CEFAZOLIN SODIUM-DEXTROSE 2-3 GM-% IV SOLR
INTRAVENOUS | Status: AC
  Filled 2015-06-12: qty 50

## 2015-06-12 MED ORDER — LIDOCAINE HCL (PF) 1 % IJ SOLN
INTRAMUSCULAR | Status: DC | PRN
  Administered 2015-06-12: 40 mL via INTRADERMAL

## 2015-06-12 MED ORDER — FENTANYL CITRATE (PF) 100 MCG/2ML IJ SOLN
INTRAMUSCULAR | Status: DC | PRN
  Administered 2015-06-12: 12.5 ug via INTRAVENOUS

## 2015-06-12 MED ORDER — SODIUM CHLORIDE 0.9 % IR SOLN
Status: AC
  Filled 2015-06-12: qty 2

## 2015-06-12 MED ORDER — FENTANYL CITRATE (PF) 100 MCG/2ML IJ SOLN
INTRAMUSCULAR | Status: AC
  Filled 2015-06-12: qty 2

## 2015-06-12 MED ORDER — MIDAZOLAM HCL 5 MG/5ML IJ SOLN
INTRAMUSCULAR | Status: AC
  Filled 2015-06-12: qty 5

## 2015-06-12 MED ORDER — ONDANSETRON HCL 4 MG/2ML IJ SOLN: 4.0000 mg | Freq: Four times a day (QID) | INTRAMUSCULAR | Status: DC | PRN

## 2015-06-12 MED ORDER — MUPIROCIN 2 % EX OINT
TOPICAL_OINTMENT | CUTANEOUS | Status: AC
  Administered 2015-06-12: 1
  Filled 2015-06-12: qty 22

## 2015-06-12 MED ORDER — MUPIROCIN 2 % EX OINT: TOPICAL_OINTMENT | CUTANEOUS | Status: DC

## 2015-06-12 MED ORDER — CHLORHEXIDINE GLUCONATE 4 % EX LIQD
60.0000 mL | Freq: Once | CUTANEOUS | Status: DC
Start: 2015-06-12 — End: 2015-06-12
  Filled 2015-06-12: qty 60

## 2015-06-12 MED ORDER — MUPIROCIN 2 % EX OINT: 1.0000 "application " | TOPICAL_OINTMENT | Freq: Once | CUTANEOUS | Status: DC

## 2015-06-12 SURGICAL SUPPLY — 5 items
CABLE SURGICAL S-101-97-12 (CABLE) ×2 IMPLANT
PACEMAKER ADAPTA DR ADDRL1 (Pacemaker) ×1 IMPLANT
PAD DEFIB LIFELINK (PAD) ×2 IMPLANT
PPM ADAPTA DR ADDRL1 (Pacemaker) ×2 IMPLANT
TRAY PACEMAKER INSERTION (PACKS) ×2 IMPLANT

## 2015-06-12 NOTE — Interval H&P Note (Signed)
History and Physical Interval Note:  06/12/2015 10:38 AM  [name] [name]  has presented today for surgery, with the diagnosis of ERI  The various methods of treatment have been discussed with the patient and family. After consideration of risks, benefits and other options for treatment, the patient has consented to  Procedure(s): Pacemaker Implant  (N/A) as a surgical intervention with removal of his old ICD generator.  The patient's history has been reviewed, patient examined, no change in status, stable for surgery.  I have reviewed the patient's chart and labs.  Questions were answered to the patient's satisfaction.     Cristopher Peru

## 2015-06-12 NOTE — H&P (Signed)
HPI [name] returns today for followup. He is a pleasant elderly man with an ICM, chronic systolic CHF, s/p ICD implant. In the interim he has refractured his hip. He has class 2 CHF symptoms. No syncope. No edema. He notes that he occasionally uses his nebulizer to help with his wheezing. He walks regularly. No chest pain. He is in rehab in a SNF. He has reached ERI on his device.  Allergies  Allergen Reactions  . Aleve [Naproxen Sodium] Other (See Comments)    Bleeding ulcers  . Ibuprofen Other (See Comments)    Bleeding ulcers     No current outpatient prescriptions on file.   No current facility-administered medications for this visit.   PMHX Chronic systolic heart failure Sinus node dysfunction    ROS:  All systems reviewed and negative except as noted in the HPI.   Past Surgical History  Procedure Laterality Date  . Heart disease      permanent pacemaker  . Appendectomy    . Inguinal hernia repair    . Hip fracture surgery  2011    ORIF R.  . Pacemaker insertion    . Colonoscopy  12/1998, 02/2004    diverticulosois, external hemorrhoids  . Esophagogastroduodenoscopy  06/17/2014    Dr. Oneida Alar: 1. Patent stricture at the gastroesophageal junction 2. UGIB Due to multiple gastric ulcers 3. Moderate Duodentitis. Negative H.pylori  . Esophagogastroduodenoscopy N/A 10/17/2014    Procedure: ESOPHAGOGASTRODUODENOSCOPY (EGD); Surgeon: Danie Binder, MD; Location: [facility]; Service: Endoscopy; Laterality: N/A; 1030  . Esophagogastroduodenoscopy N/A 02/10/2015    Procedure: ESOPHAGOGASTRODUODENOSCOPY (EGD); Surgeon: Danie Binder, MD; Location: [facility]; Service: Endoscopy; Laterality: N/A; 115  . Craniotomy Left      Family History  Problem Relation Age of Onset  . Coronary artery disease      male 1st degree relative<60  . Hypertension     . Cancer Sister      Social History   Social History  . Marital Status: Widowed    Spouse Name: N/A  . Number of Children: 2  . Years of Education: N/A   Occupational History  . retired    Social History Main Topics  . Smoking status: Former Smoker -- 1.00 packs/day for 30 years    Quit date: 10/26/1976  . Smokeless tobacco: Never Used  . Alcohol Use: No  . Drug Use: No  . Sexual Activity: Not Currently   Other Topics Concern  . Not on file   Social History Narrative   Regular exercise--no.     BP 102/64 mmHg  Pulse 75  Ht 5\' 4"  (1.626 m)  Wt 152 lb 12.8 oz (69.31 kg)  BMI 26.22 kg/m2  Physical Exam: Chronically ill appearing elderly man, NAD HEENT: Unremarkable Neck: 6 cm JVD, no thyromegally Lungs: Clear with no wheezes, rales, or rhonchi HEART: Regular rate rhythm, no murmurs, no rubs, no clicks Abd: soft, positive bowel sounds, no organomegally, no rebound, no guarding Ext: 2 plus pulses, no edema, no cyanosis, no clubbing Skin: No rashes no nodules Neuro: CN II through XII intact, motor grossly intact   DEVICE  Normal device function. See PaceArt for details.   Assess/Plan:  1. Chronic systolic heart failure - he appears euvolemic. No medication change 2. S/p ICD implant - he has reached ERI. With his advanced age, will remove and place a DDD PM 3. Sinus node dysfunction -for DDD PM.       EP Attending  Patient seen and  examined. Agree with above. Since his last visit, he has had C. Diff and been treated. Will plan to proceed with PPM generator change out. I have discussed the risks/benefits/of the procedure and he wishes to proceed.  [name].D.

## 2015-06-12 NOTE — Telephone Encounter (Signed)
Pt went to cardiology today to have his pacemaker replaced and they did a nose swab and it came back positive for MRSA and a staff infection.  They are wondering if Dr. Alain [facility] should call in an antibiotic or if they should be concerned. Please advise pts daughter Ivin Booty called and can be reached at [phone]

## 2015-06-12 NOTE — Interval H&P Note (Signed)
History and Physical Interval Note:  06/12/2015 10:39 AM  [name] [name]  has presented today for surgery, with the diagnosis of ERI  The various methods of treatment have been discussed with the patient and family. After consideration of risks, benefits and other options for treatment, the patient has consented to  Procedure(s): Pacemaker Implant  (N/A) as a surgical intervention .  The patient's history has been reviewed, patient examined, no change in status, stable for surgery.  I have reviewed the patient's chart and labs.  Questions were answered to the patient's satisfaction.     Cristopher Peru

## 2015-06-12 NOTE — Discharge Instructions (Signed)

## 2015-06-12 NOTE — Telephone Encounter (Signed)
[place] - Pharmacist, community oin Thx

## 2015-06-12 NOTE — Progress Notes (Signed)
Spoke with infection prevention nurse and client placed on enteric precautions

## 2015-06-13 ENCOUNTER — Encounter (HOSPITAL_COMMUNITY): Payer: Self-pay | Admitting: Internal Medicine

## 2015-06-13 DIAGNOSIS — E785 Hyperlipidemia, unspecified: Secondary | ICD-10-CM | POA: Diagnosis not present

## 2015-06-13 DIAGNOSIS — M9701XD Periprosthetic fracture around internal prosthetic right hip joint, subsequent encounter: Secondary | ICD-10-CM | POA: Diagnosis not present

## 2015-06-13 DIAGNOSIS — I129 Hypertensive chronic kidney disease with stage 1 through stage 4 chronic kidney disease, or unspecified chronic kidney disease: Secondary | ICD-10-CM | POA: Diagnosis not present

## 2015-06-13 DIAGNOSIS — I5042 Chronic combined systolic (congestive) and diastolic (congestive) heart failure: Secondary | ICD-10-CM | POA: Diagnosis not present

## 2015-06-13 DIAGNOSIS — N183 Chronic kidney disease, stage 3 (moderate): Secondary | ICD-10-CM | POA: Diagnosis not present

## 2015-06-13 DIAGNOSIS — E039 Hypothyroidism, unspecified: Secondary | ICD-10-CM | POA: Diagnosis not present

## 2015-06-13 NOTE — Telephone Encounter (Signed)
Notified pt md sent oint to pharmacy...[name] [name]

## 2015-06-14 DIAGNOSIS — N183 Chronic kidney disease, stage 3 (moderate): Secondary | ICD-10-CM | POA: Diagnosis not present

## 2015-06-14 DIAGNOSIS — I5042 Chronic combined systolic (congestive) and diastolic (congestive) heart failure: Secondary | ICD-10-CM | POA: Diagnosis not present

## 2015-06-14 DIAGNOSIS — E039 Hypothyroidism, unspecified: Secondary | ICD-10-CM | POA: Diagnosis not present

## 2015-06-14 DIAGNOSIS — I129 Hypertensive chronic kidney disease with stage 1 through stage 4 chronic kidney disease, or unspecified chronic kidney disease: Secondary | ICD-10-CM | POA: Diagnosis not present

## 2015-06-14 DIAGNOSIS — E785 Hyperlipidemia, unspecified: Secondary | ICD-10-CM | POA: Diagnosis not present

## 2015-06-14 DIAGNOSIS — M9701XD Periprosthetic fracture around internal prosthetic right hip joint, subsequent encounter: Secondary | ICD-10-CM | POA: Diagnosis not present

## 2015-06-15 ENCOUNTER — Telehealth: Payer: Self-pay

## 2015-06-15 DIAGNOSIS — M9701XD Periprosthetic fracture around internal prosthetic right hip joint, subsequent encounter: Secondary | ICD-10-CM | POA: Diagnosis not present

## 2015-06-15 DIAGNOSIS — I129 Hypertensive chronic kidney disease with stage 1 through stage 4 chronic kidney disease, or unspecified chronic kidney disease: Secondary | ICD-10-CM | POA: Diagnosis not present

## 2015-06-15 DIAGNOSIS — E785 Hyperlipidemia, unspecified: Secondary | ICD-10-CM | POA: Diagnosis not present

## 2015-06-15 DIAGNOSIS — E039 Hypothyroidism, unspecified: Secondary | ICD-10-CM | POA: Diagnosis not present

## 2015-06-15 DIAGNOSIS — I5042 Chronic combined systolic (congestive) and diastolic (congestive) heart failure: Secondary | ICD-10-CM | POA: Diagnosis not present

## 2015-06-15 DIAGNOSIS — N183 Chronic kidney disease, stage 3 (moderate): Secondary | ICD-10-CM | POA: Diagnosis not present

## 2015-06-15 NOTE — Telephone Encounter (Signed)
Home Health Cert/Plan of Care received (06/13/2015 - 08/02/2015) and placed on MD's desk for signature

## 2015-06-16 DIAGNOSIS — I5042 Chronic combined systolic (congestive) and diastolic (congestive) heart failure: Secondary | ICD-10-CM | POA: Diagnosis not present

## 2015-06-16 DIAGNOSIS — M9701XD Periprosthetic fracture around internal prosthetic right hip joint, subsequent encounter: Secondary | ICD-10-CM | POA: Diagnosis not present

## 2015-06-16 DIAGNOSIS — N183 Chronic kidney disease, stage 3 (moderate): Secondary | ICD-10-CM | POA: Diagnosis not present

## 2015-06-16 DIAGNOSIS — E039 Hypothyroidism, unspecified: Secondary | ICD-10-CM | POA: Diagnosis not present

## 2015-06-16 DIAGNOSIS — E785 Hyperlipidemia, unspecified: Secondary | ICD-10-CM | POA: Diagnosis not present

## 2015-06-16 DIAGNOSIS — I129 Hypertensive chronic kidney disease with stage 1 through stage 4 chronic kidney disease, or unspecified chronic kidney disease: Secondary | ICD-10-CM | POA: Diagnosis not present

## 2015-06-19 DIAGNOSIS — N183 Chronic kidney disease, stage 3 (moderate): Secondary | ICD-10-CM | POA: Diagnosis not present

## 2015-06-19 DIAGNOSIS — E039 Hypothyroidism, unspecified: Secondary | ICD-10-CM | POA: Diagnosis not present

## 2015-06-19 DIAGNOSIS — E785 Hyperlipidemia, unspecified: Secondary | ICD-10-CM | POA: Diagnosis not present

## 2015-06-19 DIAGNOSIS — M9701XD Periprosthetic fracture around internal prosthetic right hip joint, subsequent encounter: Secondary | ICD-10-CM | POA: Diagnosis not present

## 2015-06-19 DIAGNOSIS — I129 Hypertensive chronic kidney disease with stage 1 through stage 4 chronic kidney disease, or unspecified chronic kidney disease: Secondary | ICD-10-CM | POA: Diagnosis not present

## 2015-06-19 DIAGNOSIS — I5042 Chronic combined systolic (congestive) and diastolic (congestive) heart failure: Secondary | ICD-10-CM | POA: Diagnosis not present

## 2015-06-20 DIAGNOSIS — M9701XD Periprosthetic fracture around internal prosthetic right hip joint, subsequent encounter: Secondary | ICD-10-CM | POA: Diagnosis not present

## 2015-06-20 DIAGNOSIS — I129 Hypertensive chronic kidney disease with stage 1 through stage 4 chronic kidney disease, or unspecified chronic kidney disease: Secondary | ICD-10-CM | POA: Diagnosis not present

## 2015-06-20 DIAGNOSIS — N183 Chronic kidney disease, stage 3 (moderate): Secondary | ICD-10-CM | POA: Diagnosis not present

## 2015-06-20 DIAGNOSIS — E785 Hyperlipidemia, unspecified: Secondary | ICD-10-CM | POA: Diagnosis not present

## 2015-06-20 DIAGNOSIS — E039 Hypothyroidism, unspecified: Secondary | ICD-10-CM | POA: Diagnosis not present

## 2015-06-20 DIAGNOSIS — I5042 Chronic combined systolic (congestive) and diastolic (congestive) heart failure: Secondary | ICD-10-CM | POA: Diagnosis not present

## 2015-06-21 DIAGNOSIS — M5136 Other intervertebral disc degeneration, lumbar region: Secondary | ICD-10-CM | POA: Diagnosis not present

## 2015-06-21 DIAGNOSIS — N183 Chronic kidney disease, stage 3 (moderate): Secondary | ICD-10-CM | POA: Diagnosis not present

## 2015-06-21 DIAGNOSIS — M9701XD Periprosthetic fracture around internal prosthetic right hip joint, subsequent encounter: Secondary | ICD-10-CM | POA: Diagnosis not present

## 2015-06-21 DIAGNOSIS — E039 Hypothyroidism, unspecified: Secondary | ICD-10-CM | POA: Diagnosis not present

## 2015-06-21 DIAGNOSIS — I129 Hypertensive chronic kidney disease with stage 1 through stage 4 chronic kidney disease, or unspecified chronic kidney disease: Secondary | ICD-10-CM | POA: Diagnosis not present

## 2015-06-21 DIAGNOSIS — E785 Hyperlipidemia, unspecified: Secondary | ICD-10-CM | POA: Diagnosis not present

## 2015-06-21 DIAGNOSIS — I5042 Chronic combined systolic (congestive) and diastolic (congestive) heart failure: Secondary | ICD-10-CM | POA: Diagnosis not present

## 2015-06-21 DIAGNOSIS — I70209 Unspecified atherosclerosis of native arteries of extremities, unspecified extremity: Secondary | ICD-10-CM | POA: Diagnosis not present

## 2015-06-22 DIAGNOSIS — E785 Hyperlipidemia, unspecified: Secondary | ICD-10-CM | POA: Diagnosis not present

## 2015-06-22 DIAGNOSIS — E039 Hypothyroidism, unspecified: Secondary | ICD-10-CM | POA: Diagnosis not present

## 2015-06-22 DIAGNOSIS — M9701XD Periprosthetic fracture around internal prosthetic right hip joint, subsequent encounter: Secondary | ICD-10-CM | POA: Diagnosis not present

## 2015-06-22 DIAGNOSIS — N183 Chronic kidney disease, stage 3 (moderate): Secondary | ICD-10-CM | POA: Diagnosis not present

## 2015-06-22 DIAGNOSIS — I5042 Chronic combined systolic (congestive) and diastolic (congestive) heart failure: Secondary | ICD-10-CM | POA: Diagnosis not present

## 2015-06-22 DIAGNOSIS — I129 Hypertensive chronic kidney disease with stage 1 through stage 4 chronic kidney disease, or unspecified chronic kidney disease: Secondary | ICD-10-CM | POA: Diagnosis not present

## 2015-06-25 DIAGNOSIS — I5042 Chronic combined systolic (congestive) and diastolic (congestive) heart failure: Secondary | ICD-10-CM | POA: Diagnosis not present

## 2015-06-25 DIAGNOSIS — E785 Hyperlipidemia, unspecified: Secondary | ICD-10-CM | POA: Diagnosis not present

## 2015-06-25 DIAGNOSIS — M9701XD Periprosthetic fracture around internal prosthetic right hip joint, subsequent encounter: Secondary | ICD-10-CM | POA: Diagnosis not present

## 2015-06-25 DIAGNOSIS — N183 Chronic kidney disease, stage 3 (moderate): Secondary | ICD-10-CM | POA: Diagnosis not present

## 2015-06-25 DIAGNOSIS — E039 Hypothyroidism, unspecified: Secondary | ICD-10-CM | POA: Diagnosis not present

## 2015-06-25 DIAGNOSIS — I129 Hypertensive chronic kidney disease with stage 1 through stage 4 chronic kidney disease, or unspecified chronic kidney disease: Secondary | ICD-10-CM | POA: Diagnosis not present

## 2015-06-26 ENCOUNTER — Encounter: Payer: Self-pay | Admitting: Internal Medicine

## 2015-06-26 ENCOUNTER — Ambulatory Visit (INDEPENDENT_AMBULATORY_CARE_PROVIDER_SITE_OTHER): Payer: Medicare Other | Admitting: *Deleted

## 2015-06-26 DIAGNOSIS — I495 Sick sinus syndrome: Secondary | ICD-10-CM

## 2015-06-26 DIAGNOSIS — Z950 Presence of cardiac pacemaker: Secondary | ICD-10-CM

## 2015-06-26 LAB — CUP PACEART INCLINIC DEVICE CHECK
Battery Voltage: 2.79 V
Brady Statistic AP VS Percent: 64 %
Brady Statistic AS VS Percent: 36 %
Implantable Lead Implant Date: 20031204
Implantable Lead Implant Date: 20031204
Implantable Lead Location: 753859
Implantable Lead Model: 5076
Lead Channel Impedance Value: 438 Ohm
Lead Channel Impedance Value: 505 Ohm
Lead Channel Pacing Threshold Pulse Width: 0.4 ms
Lead Channel Setting Pacing Amplitude: 2 V
Lead Channel Setting Pacing Pulse Width: 0.4 ms
MDC IDC LEAD LOCATION: 753860
MDC IDC MSMT BATTERY IMPEDANCE: 100 Ohm
MDC IDC MSMT BATTERY REMAINING LONGEVITY: 167 mo
MDC IDC MSMT LEADCHNL RA PACING THRESHOLD AMPLITUDE: 0.75 V
MDC IDC MSMT LEADCHNL RA PACING THRESHOLD PULSEWIDTH: 0.4 ms
MDC IDC MSMT LEADCHNL RA SENSING INTR AMPL: 2.8 mV
MDC IDC MSMT LEADCHNL RV PACING THRESHOLD AMPLITUDE: 1 V
MDC IDC MSMT LEADCHNL RV SENSING INTR AMPL: 15.67 mV
MDC IDC SESS DTM: 20170403135909
MDC IDC SET LEADCHNL RV PACING AMPLITUDE: 2.5 V
MDC IDC SET LEADCHNL RV SENSING SENSITIVITY: 5.6 mV
MDC IDC STAT BRADY AP VP PERCENT: 0 %
MDC IDC STAT BRADY AS VP PERCENT: 0 %

## 2015-06-26 NOTE — Telephone Encounter (Signed)
Paperwork signed, faxed, copy sent to scan 

## 2015-06-26 NOTE — Progress Notes (Signed)
Wound check appointment, s/p PPM downgrade on 06/12/15. Steri-strips previously removed by patient. Wound without redness or edema. Incision edges approximated, wound well healed. Normal device function. Thresholds, sensing, and impedances consistent with implant measurements. Device programmed at chronic outputs, RA min output to 2.0V and RV min output to 2.5V. Histogram distribution appropriate for patient and level of activity. No mode switches or high ventricular rates noted. Patient educated about wound care, arm mobility, lifting restrictions. ROV with GT on 09/18/15.

## 2015-06-30 DIAGNOSIS — M9701XD Periprosthetic fracture around internal prosthetic right hip joint, subsequent encounter: Secondary | ICD-10-CM | POA: Diagnosis not present

## 2015-06-30 DIAGNOSIS — E039 Hypothyroidism, unspecified: Secondary | ICD-10-CM | POA: Diagnosis not present

## 2015-06-30 DIAGNOSIS — N183 Chronic kidney disease, stage 3 (moderate): Secondary | ICD-10-CM | POA: Diagnosis not present

## 2015-06-30 DIAGNOSIS — I129 Hypertensive chronic kidney disease with stage 1 through stage 4 chronic kidney disease, or unspecified chronic kidney disease: Secondary | ICD-10-CM | POA: Diagnosis not present

## 2015-06-30 DIAGNOSIS — I5042 Chronic combined systolic (congestive) and diastolic (congestive) heart failure: Secondary | ICD-10-CM | POA: Diagnosis not present

## 2015-06-30 DIAGNOSIS — E785 Hyperlipidemia, unspecified: Secondary | ICD-10-CM | POA: Diagnosis not present

## 2015-07-04 ENCOUNTER — Encounter: Payer: Self-pay | Admitting: Internal Medicine

## 2015-07-04 ENCOUNTER — Ambulatory Visit (INDEPENDENT_AMBULATORY_CARE_PROVIDER_SITE_OTHER): Payer: Medicare Other | Admitting: Internal Medicine

## 2015-07-04 VITALS — BP 160/80 | HR 67 | Wt 148.0 lb

## 2015-07-04 DIAGNOSIS — I5042 Chronic combined systolic (congestive) and diastolic (congestive) heart failure: Secondary | ICD-10-CM | POA: Diagnosis not present

## 2015-07-04 DIAGNOSIS — I255 Ischemic cardiomyopathy: Secondary | ICD-10-CM | POA: Diagnosis not present

## 2015-07-04 DIAGNOSIS — I129 Hypertensive chronic kidney disease with stage 1 through stage 4 chronic kidney disease, or unspecified chronic kidney disease: Secondary | ICD-10-CM | POA: Diagnosis not present

## 2015-07-04 DIAGNOSIS — E785 Hyperlipidemia, unspecified: Secondary | ICD-10-CM | POA: Diagnosis not present

## 2015-07-04 DIAGNOSIS — I10 Essential (primary) hypertension: Secondary | ICD-10-CM | POA: Diagnosis not present

## 2015-07-04 DIAGNOSIS — E039 Hypothyroidism, unspecified: Secondary | ICD-10-CM | POA: Diagnosis not present

## 2015-07-04 DIAGNOSIS — R197 Diarrhea, unspecified: Secondary | ICD-10-CM

## 2015-07-04 DIAGNOSIS — M9701XD Periprosthetic fracture around internal prosthetic right hip joint, subsequent encounter: Secondary | ICD-10-CM | POA: Diagnosis not present

## 2015-07-04 DIAGNOSIS — M5440 Lumbago with sciatica, unspecified side: Secondary | ICD-10-CM | POA: Diagnosis not present

## 2015-07-04 DIAGNOSIS — G8929 Other chronic pain: Secondary | ICD-10-CM

## 2015-07-04 DIAGNOSIS — N183 Chronic kidney disease, stage 3 (moderate): Secondary | ICD-10-CM | POA: Diagnosis not present

## 2015-07-04 DIAGNOSIS — R413 Other amnesia: Secondary | ICD-10-CM | POA: Diagnosis not present

## 2015-07-04 MED ORDER — TRAMADOL-ACETAMINOPHEN 37.5-325 MG PO TABS: 0.5000 | ORAL_TABLET | Freq: Four times a day (QID) | ORAL | Status: DC | PRN

## 2015-07-04 NOTE — Progress Notes (Signed)
Subjective:  Patient ID: [name] [name], male    DOB: [birth year]  Age: 82 y.o. MRN: [id number]  CC: No chief complaint on file.   HPI [name] presents for severe LBP. Dr Nelva Bush gave steroids po - no better; procedure is planned for 4/21 Pain is 9/10  Outpatient Prescriptions Prior to Visit  Medication Sig Dispense Refill  . acetaminophen (TYLENOL) 500 MG tablet Take 500 mg by mouth every 6 (six) hours as needed for headache.    . albuterol (PROVENTIL) (2.5 MG/3ML) 0.083% nebulizer solution Take 3 mLs (2.5 mg total) by nebulization every 6 (six) hours as needed for wheezing or shortness of breath. 375 mL 1  . AMBULATORY NON FORMULARY MEDICATION Diltiazem gel 2% Apply small amount pea-sized to anal area 2-3 times a day for anal stenosis. Use as needed. 30 g 5  . Artificial Tear Ointment (DRY EYES OP) Apply 1 drop to eye daily.    Marland Kitchen atorvastatin (LIPITOR) 20 MG tablet TAKE 1/2 TABLET TWICE DAILY (Patient taking differently: TAKE 1/2 (10 MG ) TABLET TWICE DAILY) 90 tablet 2  . Cholecalciferol (VITAMIN D3) 1000 UNITS tablet Take 1,000 Units by mouth daily.      . diphenoxylate-atropine (LOMOTIL) 2.5-0.025 MG tablet Take 1 tablet by mouth 4 (four) times daily as needed for diarrhea or loose stools. 60 tablet 1  . fluticasone (FLONASE) 50 MCG/ACT nasal spray Place 2 sprays into both nostrils daily. 48 g 3  . folic acid (FOLVITE) Q000111Q MCG tablet Take 400 mcg by mouth daily.     Marland Kitchen levothyroxine (SYNTHROID, LEVOTHROID) 112 MCG tablet Take 1 tablet (112 mcg total) by mouth daily. 30 tablet 11  . metoprolol succinate (TOPROL-XL) 25 MG 24 hr tablet Take 0.5 tablets (12.5 mg total) by mouth 2 (two) times daily. 90 tablet 3  . nitroGLYCERIN (NITROSTAT) 0.4 MG SL tablet Place 1 tablet (0.4 mg total) under the tongue every 5 (five) minutes as needed. Chest pain 20 tablet 2  . pantoprazole (PROTONIX) 40 MG tablet Take 1 tablet (40 mg total) by mouth daily. 90 tablet 3  . saccharomyces boulardii  (FLORASTOR) 250 MG capsule Take 1 capsule (250 mg total) by mouth 2 (two) times daily. 60 capsule 2  . triamcinolone (NASACORT) 55 MCG/ACT nasal inhaler Place 1 spray into the nose daily. 3 Inhaler 1  . mupirocin ointment (BACTROBAN) 2 % Use in each nostril bid x 5 days (Patient not taking: Reported on 07/04/2015) 22 g 0  . predniSONE (STERAPRED UNI-PAK 21 TAB) 10 MG (21) TBPK tablet Take 10 mg by mouth daily. Reported on 07/04/2015     No facility-administered medications prior to visit.    ROS Review of Systems  Constitutional: Negative for appetite change, fatigue and unexpected weight change.  HENT: Negative for congestion, nosebleeds, sneezing, sore throat and trouble swallowing.   Eyes: Negative for itching and visual disturbance.  Respiratory: Negative for cough.   Cardiovascular: Negative for chest pain, palpitations and leg swelling.  Gastrointestinal: Negative for nausea, diarrhea, blood in stool and abdominal distention.  Genitourinary: Negative for frequency and hematuria.  Musculoskeletal: Positive for back pain and gait problem. Negative for joint swelling and neck pain.  Skin: Negative for rash.  Neurological: Negative for dizziness, tremors, speech difficulty and weakness.  Psychiatric/Behavioral: Negative for sleep disturbance, dysphoric mood and agitation. The patient is not nervous/anxious.     Objective:  BP 160/80 mmHg  Pulse 67  Wt 148 lb (67.132 kg)  SpO2 97%  BP  Readings from Last 3 Encounters:  07/04/15 160/80  06/12/15 162/78  06/07/15 142/72    Wt Readings from Last 3 Encounters:  07/04/15 148 lb (67.132 kg)  06/12/15 152 lb (68.947 kg)  06/07/15 158 lb (71.668 kg)    Physical Exam  Constitutional: He is oriented to person, place, and time. He appears well-developed. No distress.  NAD  HENT:  Mouth/Throat: Oropharynx is clear and moist.  Eyes: Conjunctivae are normal. Pupils are equal, round, and reactive to light.  Neck: Normal range of motion.  No JVD present. No thyromegaly present.  Cardiovascular: Normal rate, regular rhythm, normal heart sounds and intact distal pulses.  Exam reveals no gallop and no friction rub.   No murmur heard. Pulmonary/Chest: Effort normal and breath sounds normal. No respiratory distress. He has no wheezes. He has no rales. He exhibits no tenderness.  Abdominal: Soft. Bowel sounds are normal. He exhibits no distension and no mass. There is no tenderness. There is no rebound and no guarding.  Musculoskeletal: Normal range of motion. He exhibits tenderness. He exhibits no edema.  Lymphadenopathy:    He has no cervical adenopathy.  Neurological: He is alert and oriented to person, place, and time. He has normal reflexes. No cranial nerve deficit. He exhibits normal muscle tone. He displays a negative Romberg sign. Coordination and gait normal.  Skin: Skin is warm and dry. No rash noted.  Psychiatric: He has a normal mood and affect. His behavior is normal. Judgment and thought content normal.  LS is very tender w/ROM In a w/c Weak legs  Lab Results  Component Value Date   WBC 12.2* 06/07/2015   HGB 11.8* 06/07/2015   HCT 35.5* 06/07/2015   PLT 242.0 06/07/2015   GLUCOSE 104* 06/07/2015   CHOL 110 05/25/2013   TRIG 67.0 05/25/2013   HDL 40.30 05/25/2013   LDLCALC 56 05/25/2013   ALT 12 05/12/2015   AST 16 05/12/2015   NA 131* 06/07/2015   K 4.3 06/07/2015   CL 100 06/07/2015   CREATININE 1.57* 06/07/2015   BUN 35* 06/07/2015   CO2 25 06/07/2015   TSH 6.20* 06/07/2015   PSA 3.42 11/28/2011   INR 1.0 09/27/2014    Dg Lumbar Spine 2-3 Views  06/07/2015  CLINICAL DATA:  Low back pain for 2 weeks. Recent hip fracture. No known injury within the lumbar spine. EXAM: LUMBAR SPINE - 2-3 VIEW COMPARISON:  Lumbar spine plain film dated 07/01/2011. FINDINGS: Overall alignment of the lumbar spine is stable. Again noted is diffuse osteopenia which limits characterization of osseous detail but there is no  acute or suspicious osseous lesion seen. No fracture line or displaced fracture fragment seen. There is a stable wedge compression deformity of the T12 vertebral body. Slightly increased compression of the L4 and L5 superior endplates is likely related to overlying degenerative disc disease. Degenerative changes are again seen throughout the thoracolumbar spine, moderate in degree, with associated disc space narrowings and osseous spurring. The degenerative changes have progressed slightly in the interval. Prominent atherosclerotic calcifications are again seen along the walls of the normal-caliber infrarenal abdominal aorta. Visualized paravertebral soft tissues are otherwise unremarkable. Right hip prosthesis hardware is partially imaged. IMPRESSION: 1. Chronic/degenerative changes as detailed above. 2. No acute findings. Electronically Signed   By: Franki Cabot M.D.   On: 06/07/2015 16:21    Assessment & Plan:   There are no diagnoses linked to this encounter. I have discontinued Mr. Ghannam predniSONE. I am also having him maintain  his folic acid, cholecalciferol, triamcinolone, fluticasone, nitroGLYCERIN, atorvastatin, metoprolol succinate, albuterol, acetaminophen, Artificial Tear Ointment (DRY EYES OP), AMBULATORY NON FORMULARY MEDICATION, pantoprazole, saccharomyces boulardii, diphenoxylate-atropine, levothyroxine, and mupirocin ointment.  No orders of the defined types were placed in this encounter.     Follow-up: No Follow-up on file.  Walker Kehr, MD

## 2015-07-04 NOTE — Assessment & Plan Note (Signed)
4/17 Ultracet w/caution  Potential benefits of a long term Tramadol use as well as potential risks  and complications were explained to the patient and were aknowledged. Procedure pending

## 2015-07-04 NOTE — Assessment & Plan Note (Signed)
Resolving

## 2015-07-04 NOTE — Assessment & Plan Note (Signed)
Restart Aricept in 2 wks if no diarrhea

## 2015-07-04 NOTE — Assessment & Plan Note (Signed)
Chronic  On Metoprolol 

## 2015-07-04 NOTE — Progress Notes (Signed)
Pre visit review using our clinic review tool, if applicable. No additional management support is needed unless otherwise documented below in the visit note. 

## 2015-07-11 ENCOUNTER — Other Ambulatory Visit: Payer: Self-pay | Admitting: Hematology and Oncology

## 2015-07-14 DIAGNOSIS — M47816 Spondylosis without myelopathy or radiculopathy, lumbar region: Secondary | ICD-10-CM | POA: Diagnosis not present

## 2015-07-14 DIAGNOSIS — M5136 Other intervertebral disc degeneration, lumbar region: Secondary | ICD-10-CM | POA: Diagnosis not present

## 2015-07-18 ENCOUNTER — Telehealth: Payer: Self-pay | Admitting: Internal Medicine

## 2015-07-18 DIAGNOSIS — I5042 Chronic combined systolic (congestive) and diastolic (congestive) heart failure: Secondary | ICD-10-CM | POA: Diagnosis not present

## 2015-07-18 DIAGNOSIS — E039 Hypothyroidism, unspecified: Secondary | ICD-10-CM | POA: Diagnosis not present

## 2015-07-18 DIAGNOSIS — E785 Hyperlipidemia, unspecified: Secondary | ICD-10-CM | POA: Diagnosis not present

## 2015-07-18 DIAGNOSIS — M9701XD Periprosthetic fracture around internal prosthetic right hip joint, subsequent encounter: Secondary | ICD-10-CM | POA: Diagnosis not present

## 2015-07-18 DIAGNOSIS — N183 Chronic kidney disease, stage 3 (moderate): Secondary | ICD-10-CM | POA: Diagnosis not present

## 2015-07-18 DIAGNOSIS — I129 Hypertensive chronic kidney disease with stage 1 through stage 4 chronic kidney disease, or unspecified chronic kidney disease: Secondary | ICD-10-CM | POA: Diagnosis not present

## 2015-07-18 NOTE — Telephone Encounter (Signed)
Arville Go, nurse from [facility], request order for Home health aid, social worker and skill nursing for pressure ulcer twice a week. Please help

## 2015-07-18 NOTE — Telephone Encounter (Signed)
Ok Thx 

## 2015-07-19 ENCOUNTER — Emergency Department (HOSPITAL_COMMUNITY)
Admission: EM | Admit: 2015-07-19 | Discharge: 2015-07-19 | Disposition: A | Discharge status: Home / Self Care | Payer: Medicare Other | Attending: Emergency Medicine | Admitting: Emergency Medicine

## 2015-07-19 ENCOUNTER — Emergency Department (HOSPITAL_COMMUNITY): Payer: Medicare Other

## 2015-07-19 ENCOUNTER — Encounter (HOSPITAL_COMMUNITY): Payer: Self-pay | Admitting: *Deleted

## 2015-07-19 DIAGNOSIS — Z87891 Personal history of nicotine dependence: Secondary | ICD-10-CM | POA: Diagnosis not present

## 2015-07-19 DIAGNOSIS — I129 Hypertensive chronic kidney disease with stage 1 through stage 4 chronic kidney disease, or unspecified chronic kidney disease: Secondary | ICD-10-CM | POA: Diagnosis not present

## 2015-07-19 DIAGNOSIS — E785 Hyperlipidemia, unspecified: Secondary | ICD-10-CM | POA: Diagnosis not present

## 2015-07-19 DIAGNOSIS — E039 Hypothyroidism, unspecified: Secondary | ICD-10-CM | POA: Diagnosis not present

## 2015-07-19 DIAGNOSIS — I110 Hypertensive heart disease with heart failure: Secondary | ICD-10-CM | POA: Insufficient documentation

## 2015-07-19 DIAGNOSIS — I509 Heart failure, unspecified: Secondary | ICD-10-CM | POA: Insufficient documentation

## 2015-07-19 DIAGNOSIS — M9701XD Periprosthetic fracture around internal prosthetic right hip joint, subsequent encounter: Secondary | ICD-10-CM | POA: Diagnosis not present

## 2015-07-19 DIAGNOSIS — M25552 Pain in left hip: Secondary | ICD-10-CM | POA: Insufficient documentation

## 2015-07-19 DIAGNOSIS — N183 Chronic kidney disease, stage 3 (moderate): Secondary | ICD-10-CM | POA: Diagnosis not present

## 2015-07-19 DIAGNOSIS — S32502D Unspecified fracture of left pubis, subsequent encounter for fracture with routine healing: Secondary | ICD-10-CM | POA: Diagnosis not present

## 2015-07-19 DIAGNOSIS — Z79899 Other long term (current) drug therapy: Secondary | ICD-10-CM | POA: Insufficient documentation

## 2015-07-19 DIAGNOSIS — I5042 Chronic combined systolic (congestive) and diastolic (congestive) heart failure: Secondary | ICD-10-CM | POA: Diagnosis not present

## 2015-07-19 DIAGNOSIS — Z85028 Personal history of other malignant neoplasm of stomach: Secondary | ICD-10-CM | POA: Insufficient documentation

## 2015-07-19 MED ORDER — HYDROCODONE-ACETAMINOPHEN 5-325 MG PO TABS: 1.0000 | ORAL_TABLET | Freq: Three times a day (TID) | ORAL | Status: DC

## 2015-07-19 NOTE — Discharge Instructions (Signed)
Joint Pain Joint pain, which is also called arthralgia, can be caused by many things. Joint pain often goes away when you follow your health care provider's instructions for relieving pain at home. However, joint pain can also be caused by conditions that require further treatment. Common causes of joint pain include:  Bruising in the area of the joint.  Overuse of the joint.  Wear and tear on the joints that occur with aging (osteoarthritis).  Various other forms of arthritis.  A buildup of a crystal form of uric acid in the joint (gout).  Infections of the joint (septic arthritis) or of the bone (osteomyelitis). Your health care provider may recommend medicine to help with the pain. If your joint pain continues, additional tests may be needed to diagnose your condition. HOME CARE INSTRUCTIONS Watch your condition for any changes. Follow these instructions as directed to lessen the pain that you are feeling.  Take medicines only as directed by your health care provider.  Rest the affected area for as long as your health care provider says that you should. If directed to do so, raise the painful joint above the level of your heart while you are sitting or lying down.  Do not do things that cause or worsen pain.  If directed, apply ice to the painful area:  Put ice in a plastic bag.  Place a towel between your skin and the bag.  Leave the ice on for 20 minutes, 2-3 times per day.  Wear an elastic bandage, splint, or sling as directed by your health care provider. Loosen the elastic bandage or splint if your fingers or toes become numb and tingle, or if they turn cold and blue.  Begin exercising or stretching the affected area as directed by your health care provider. Ask your health care provider what types of exercise are safe for you.  Keep all follow-up visits as directed by your health care provider. This is important. SEEK MEDICAL CARE IF:  Your pain increases, and medicine  does not help.  Your joint pain does not improve within 3 days.  You have increased bruising or swelling.  You have a fever.  You lose 10 lb (4.5 kg) or more without trying. SEEK IMMEDIATE MEDICAL CARE IF:  You are not able to move the joint.  Your fingers or toes become numb or they turn cold and blue.   This information is not intended to replace advice given to you by your health care provider. Make sure you discuss any questions you have with your health care provider.   Document Released: 03/11/2005 Document Revised: 04/01/2014 Document Reviewed: 12/21/2013 Elsevier Interactive Patient Education 2016 Elsevier Inc.  Musculoskeletal Pain Musculoskeletal pain is muscle and boney aches and pains. These pains can occur in any part of the body. Your caregiver may treat you without knowing the cause of the pain. They may treat you if blood or urine tests, X-rays, and other tests were normal.  CAUSES There is often not a definite cause or reason for these pains. These pains may be caused by a type of germ (virus). The discomfort may also come from overuse. Overuse includes working out too hard when your body is not fit. Boney aches also come from weather changes. Bone is sensitive to atmospheric pressure changes. HOME CARE INSTRUCTIONS   Ask when your test results will be ready. Make sure you get your test results.  Only take over-the-counter or prescription medicines for pain, discomfort, or fever as directed by  your caregiver. If you were given medications for your condition, do not drive, operate machinery or power tools, or sign legal documents for 24 hours. Do not drink alcohol. Do not take sleeping pills or other medications that may interfere with treatment.  Continue all activities unless the activities cause more pain. When the pain lessens, slowly resume normal activities. Gradually increase the intensity and duration of the activities or exercise.  During periods of severe  pain, bed rest may be helpful. Lay or sit in any position that is comfortable.  Putting ice on the injured area.  Put ice in a bag.  Place a towel between your skin and the bag.  Leave the ice on for 15 to 20 minutes, 3 to 4 times a day.  Follow up with your caregiver for continued problems and no reason can be found for the pain. If the pain becomes worse or does not go away, it may be necessary to repeat tests or do additional testing. Your caregiver may need to look further for a possible cause. SEEK IMMEDIATE MEDICAL CARE IF:  You have pain that is getting worse and is not relieved by medications.  You develop chest pain that is associated with shortness or breath, sweating, feeling sick to your stomach (nauseous), or throw up (vomit).  Your pain becomes localized to the abdomen.  You develop any new symptoms that seem different or that concern you. MAKE SURE YOU:   Understand these instructions.  Will watch your condition.  Will get help right away if you are not doing well or get worse.   This information is not intended to replace advice given to you by your health care provider. Make sure you discuss any questions you have with your health care provider.   Document Released: 03/11/2005 Document Revised: 06/03/2011 Document Reviewed: 11/13/2012 Elsevier Interactive Patient Education [employer].

## 2015-07-19 NOTE — Telephone Encounter (Signed)
Patient's daughter in law called to advise that they forgot to mention that they are requesting an xray of the left hip. Patient broke the hip, went through rehab, and is experiencing sx such as back pain. He is currently taking pain medication for this. Please advise

## 2015-07-19 NOTE — Telephone Encounter (Signed)
I called Butch Penny. She states patient is at ER now and will call us if anything further is needed from [place].

## 2015-07-19 NOTE — ED Notes (Signed)
Patient c/o of left hip. Denies injury.

## 2015-07-19 NOTE — Telephone Encounter (Signed)
Notified Joann w/MD response...[name] [name]

## 2015-07-19 NOTE — ED Notes (Signed)
MD at bedside. 

## 2015-07-20 DIAGNOSIS — M9701XD Periprosthetic fracture around internal prosthetic right hip joint, subsequent encounter: Secondary | ICD-10-CM | POA: Diagnosis not present

## 2015-07-20 DIAGNOSIS — E785 Hyperlipidemia, unspecified: Secondary | ICD-10-CM | POA: Diagnosis not present

## 2015-07-20 DIAGNOSIS — I5042 Chronic combined systolic (congestive) and diastolic (congestive) heart failure: Secondary | ICD-10-CM | POA: Diagnosis not present

## 2015-07-20 DIAGNOSIS — I129 Hypertensive chronic kidney disease with stage 1 through stage 4 chronic kidney disease, or unspecified chronic kidney disease: Secondary | ICD-10-CM | POA: Diagnosis not present

## 2015-07-20 DIAGNOSIS — N183 Chronic kidney disease, stage 3 (moderate): Secondary | ICD-10-CM | POA: Diagnosis not present

## 2015-07-20 DIAGNOSIS — E039 Hypothyroidism, unspecified: Secondary | ICD-10-CM | POA: Diagnosis not present

## 2015-07-21 DIAGNOSIS — I5042 Chronic combined systolic (congestive) and diastolic (congestive) heart failure: Secondary | ICD-10-CM | POA: Diagnosis not present

## 2015-07-21 DIAGNOSIS — E785 Hyperlipidemia, unspecified: Secondary | ICD-10-CM | POA: Diagnosis not present

## 2015-07-21 DIAGNOSIS — E039 Hypothyroidism, unspecified: Secondary | ICD-10-CM | POA: Diagnosis not present

## 2015-07-21 DIAGNOSIS — M9701XD Periprosthetic fracture around internal prosthetic right hip joint, subsequent encounter: Secondary | ICD-10-CM | POA: Diagnosis not present

## 2015-07-21 DIAGNOSIS — N183 Chronic kidney disease, stage 3 (moderate): Secondary | ICD-10-CM | POA: Diagnosis not present

## 2015-07-21 DIAGNOSIS — I129 Hypertensive chronic kidney disease with stage 1 through stage 4 chronic kidney disease, or unspecified chronic kidney disease: Secondary | ICD-10-CM | POA: Diagnosis not present

## 2015-07-24 DIAGNOSIS — I5042 Chronic combined systolic (congestive) and diastolic (congestive) heart failure: Secondary | ICD-10-CM | POA: Diagnosis not present

## 2015-07-24 DIAGNOSIS — N183 Chronic kidney disease, stage 3 (moderate): Secondary | ICD-10-CM | POA: Diagnosis not present

## 2015-07-24 DIAGNOSIS — M9701XD Periprosthetic fracture around internal prosthetic right hip joint, subsequent encounter: Secondary | ICD-10-CM | POA: Diagnosis not present

## 2015-07-24 DIAGNOSIS — E785 Hyperlipidemia, unspecified: Secondary | ICD-10-CM | POA: Diagnosis not present

## 2015-07-24 DIAGNOSIS — E039 Hypothyroidism, unspecified: Secondary | ICD-10-CM | POA: Diagnosis not present

## 2015-07-24 DIAGNOSIS — I129 Hypertensive chronic kidney disease with stage 1 through stage 4 chronic kidney disease, or unspecified chronic kidney disease: Secondary | ICD-10-CM | POA: Diagnosis not present

## 2015-07-25 DIAGNOSIS — M5136 Other intervertebral disc degeneration, lumbar region: Secondary | ICD-10-CM | POA: Diagnosis not present

## 2015-07-26 ENCOUNTER — Other Ambulatory Visit (HOSPITAL_COMMUNITY): Payer: Self-pay | Admitting: Orthopedic Surgery

## 2015-07-26 DIAGNOSIS — G8929 Other chronic pain: Secondary | ICD-10-CM

## 2015-07-26 DIAGNOSIS — I5042 Chronic combined systolic (congestive) and diastolic (congestive) heart failure: Secondary | ICD-10-CM | POA: Diagnosis not present

## 2015-07-26 DIAGNOSIS — I129 Hypertensive chronic kidney disease with stage 1 through stage 4 chronic kidney disease, or unspecified chronic kidney disease: Secondary | ICD-10-CM | POA: Diagnosis not present

## 2015-07-26 DIAGNOSIS — M9701XD Periprosthetic fracture around internal prosthetic right hip joint, subsequent encounter: Secondary | ICD-10-CM | POA: Diagnosis not present

## 2015-07-26 DIAGNOSIS — M25552 Pain in left hip: Principal | ICD-10-CM

## 2015-07-26 DIAGNOSIS — E785 Hyperlipidemia, unspecified: Secondary | ICD-10-CM | POA: Diagnosis not present

## 2015-07-26 DIAGNOSIS — M84359A Stress fracture, hip, unspecified, initial encounter for fracture: Secondary | ICD-10-CM

## 2015-07-26 DIAGNOSIS — E039 Hypothyroidism, unspecified: Secondary | ICD-10-CM | POA: Diagnosis not present

## 2015-07-26 DIAGNOSIS — N183 Chronic kidney disease, stage 3 (moderate): Secondary | ICD-10-CM | POA: Diagnosis not present

## 2015-07-27 ENCOUNTER — Encounter (HOSPITAL_COMMUNITY)
Admission: RE | Admit: 2015-07-27 | Discharge: 2015-07-27 | Disposition: A | Discharge status: Home / Self Care | Payer: Medicare Other | Source: Ambulatory Visit | Attending: Orthopedic Surgery | Admitting: Orthopedic Surgery

## 2015-07-27 ENCOUNTER — Encounter (HOSPITAL_COMMUNITY): Payer: Self-pay

## 2015-07-27 DIAGNOSIS — M84359A Stress fracture, hip, unspecified, initial encounter for fracture: Secondary | ICD-10-CM | POA: Insufficient documentation

## 2015-07-27 DIAGNOSIS — G8929 Other chronic pain: Secondary | ICD-10-CM | POA: Insufficient documentation

## 2015-07-27 DIAGNOSIS — R948 Abnormal results of function studies of other organs and systems: Secondary | ICD-10-CM | POA: Diagnosis not present

## 2015-07-27 DIAGNOSIS — M25552 Pain in left hip: Secondary | ICD-10-CM | POA: Insufficient documentation

## 2015-07-27 HISTORY — DX: Systemic involvement of connective tissue, unspecified: M35.9

## 2015-07-27 HISTORY — DX: Heart failure, unspecified: I50.9

## 2015-07-27 MED ORDER — TECHNETIUM TC 99M MEDRONATE IV KIT
25.0000 | PACK | Freq: Once | INTRAVENOUS | Status: AC | PRN
  Administered 2015-07-27: 24.7 via INTRAVENOUS

## 2015-07-28 DIAGNOSIS — S32040D Wedge compression fracture of fourth lumbar vertebra, subsequent encounter for fracture with routine healing: Secondary | ICD-10-CM | POA: Diagnosis not present

## 2015-07-28 DIAGNOSIS — M5136 Other intervertebral disc degeneration, lumbar region: Secondary | ICD-10-CM | POA: Diagnosis not present

## 2015-07-30 ENCOUNTER — Emergency Department (HOSPITAL_COMMUNITY): Payer: Medicare Other

## 2015-07-30 ENCOUNTER — Encounter (HOSPITAL_COMMUNITY): Payer: Self-pay | Admitting: *Deleted

## 2015-07-30 ENCOUNTER — Emergency Department (HOSPITAL_COMMUNITY)
Admission: EM | Admit: 2015-07-30 | Discharge: 2015-07-30 | Disposition: A | Discharge status: Home / Self Care | Payer: Medicare Other | Attending: Emergency Medicine | Admitting: Emergency Medicine

## 2015-07-30 DIAGNOSIS — M549 Dorsalgia, unspecified: Secondary | ICD-10-CM | POA: Diagnosis not present

## 2015-07-30 DIAGNOSIS — S300XXA Contusion of lower back and pelvis, initial encounter: Secondary | ICD-10-CM | POA: Diagnosis not present

## 2015-07-30 DIAGNOSIS — Z79891 Long term (current) use of opiate analgesic: Secondary | ICD-10-CM | POA: Insufficient documentation

## 2015-07-30 DIAGNOSIS — M545 Low back pain: Secondary | ICD-10-CM | POA: Diagnosis present

## 2015-07-30 DIAGNOSIS — R259 Unspecified abnormal involuntary movements: Secondary | ICD-10-CM | POA: Diagnosis not present

## 2015-07-30 DIAGNOSIS — Y999 Unspecified external cause status: Secondary | ICD-10-CM | POA: Insufficient documentation

## 2015-07-30 DIAGNOSIS — Z79899 Other long term (current) drug therapy: Secondary | ICD-10-CM | POA: Diagnosis not present

## 2015-07-30 DIAGNOSIS — Z7951 Long term (current) use of inhaled steroids: Secondary | ICD-10-CM | POA: Diagnosis not present

## 2015-07-30 DIAGNOSIS — Z85028 Personal history of other malignant neoplasm of stomach: Secondary | ICD-10-CM | POA: Diagnosis not present

## 2015-07-30 DIAGNOSIS — Y939 Activity, unspecified: Secondary | ICD-10-CM | POA: Diagnosis not present

## 2015-07-30 DIAGNOSIS — I110 Hypertensive heart disease with heart failure: Secondary | ICD-10-CM | POA: Insufficient documentation

## 2015-07-30 DIAGNOSIS — T148 Other injury of unspecified body region: Secondary | ICD-10-CM | POA: Diagnosis not present

## 2015-07-30 DIAGNOSIS — I509 Heart failure, unspecified: Secondary | ICD-10-CM | POA: Insufficient documentation

## 2015-07-30 DIAGNOSIS — S3992XA Unspecified injury of lower back, initial encounter: Secondary | ICD-10-CM | POA: Diagnosis not present

## 2015-07-30 DIAGNOSIS — Z950 Presence of cardiac pacemaker: Secondary | ICD-10-CM | POA: Diagnosis not present

## 2015-07-30 DIAGNOSIS — S79912A Unspecified injury of left hip, initial encounter: Secondary | ICD-10-CM | POA: Diagnosis not present

## 2015-07-30 DIAGNOSIS — Y929 Unspecified place or not applicable: Secondary | ICD-10-CM | POA: Insufficient documentation

## 2015-07-30 DIAGNOSIS — W1839XA Other fall on same level, initial encounter: Secondary | ICD-10-CM | POA: Diagnosis not present

## 2015-07-30 DIAGNOSIS — Z87891 Personal history of nicotine dependence: Secondary | ICD-10-CM | POA: Insufficient documentation

## 2015-07-30 DIAGNOSIS — M25552 Pain in left hip: Secondary | ICD-10-CM | POA: Diagnosis not present

## 2015-07-30 DIAGNOSIS — S79919A Unspecified injury of unspecified hip, initial encounter: Secondary | ICD-10-CM | POA: Diagnosis not present

## 2015-07-30 DIAGNOSIS — M533 Sacrococcygeal disorders, not elsewhere classified: Secondary | ICD-10-CM | POA: Diagnosis not present

## 2015-07-30 DIAGNOSIS — S20222A Contusion of left back wall of thorax, initial encounter: Secondary | ICD-10-CM

## 2015-07-30 MED ORDER — HYDROMORPHONE HCL 1 MG/ML IJ SOLN
0.7500 mg | Freq: Once | INTRAMUSCULAR | Status: AC
  Administered 2015-07-30: 0.75 mg via INTRAMUSCULAR
  Filled 2015-07-30: qty 1

## 2015-07-30 NOTE — ED Notes (Signed)
Patient transported to X-ray 

## 2015-07-30 NOTE — ED Notes (Signed)
Pt from home via [place]- Per EMS pt fell today after reaching for something and lost balance. Pt fell onto wood floor but denies injury. Pt has hx of compression fx and chronic lower back pain. Pt sts that his pain is no worse than his usual pain but because of his hx wants to be evaluated. Pt is A&O and in NAD.

## 2015-07-30 NOTE — Discharge Instructions (Signed)

## 2015-07-30 NOTE — ED Notes (Signed)
Bed: WA21 Expected date:  Expected time:  Means of arrival:  Comments: EMS- flank pain

## 2015-07-30 NOTE — ED Provider Notes (Addendum)
CSN: [id number]     Arrival date & time 07/30/15  1622 History   First MD Initiated Contact with Patient 07/30/15 1642     Chief Complaint  Patient presents with  . Fall  . Back Pain     (Consider location/radiation/quality/duration/timing/severity/associated sxs/prior Treatment) HPI  82 year old male with sacral/left lower back pain. Patient mechanical fall earlier today. He lost his balance and fell backwards. Is been having pain near his left buttock/left lower back since then. He has been ambulatory since the fall although he has increased pain with movement. Denies a significant acute pain anywhere else. No blood thinners. No acute numbness, tingling or focal loss of strength. Of note, patient just had a bone scan a couple days ago which was negative for likely L4 vertebral fracture. He has been previously been prescribed Ultracet which he has the bottle with him currently. He was given a tablet of this per his request.  Past Medical History  Diagnosis Date  . Cancer ([place])     Stomach  . Collagen vascular disease ([place])   . CHF (congestive heart failure) ([place])   . Hypertension    Past Surgical History  Procedure Laterality Date  . Heart disease      permanent pacemaker  . Appendectomy    . Inguinal hernia repair    . Hip fracture surgery  2011    ORIF  R.  . Pacemaker insertion    . Colonoscopy  12/1998, 02/2004    diverticulosois, external hemorrhoids  . Esophagogastroduodenoscopy  06/17/2014    Dr. Oneida Alar: 1. Patent stricture at the gastroesophageal junction 2. UGIB Due to multiple  gastric ulcers 3. Moderate Duodentitis. Negative H.pylori  . Esophagogastroduodenoscopy N/A 10/17/2014    Procedure: ESOPHAGOGASTRODUODENOSCOPY (EGD);  Surgeon: Danie Binder, MD;  [facility];  Service: Endoscopy;  Laterality: N/A;  1030  . Esophagogastroduodenoscopy N/A 02/10/2015    Procedure: ESOPHAGOGASTRODUODENOSCOPY (EGD);  Surgeon: Danie Binder, MD;  [facility];  Service: Endoscopy;  Laterality: N/A;  115  . Craniotomy Left   . Ep implantable device N/A 06/12/2015    Procedure: Pacemaker Implant ;  Surgeon: Evans Lance, MD;  Location: [facility];  Service: Cardiovascular;  Laterality: N/A;   Family History  Problem Relation Age of Onset  . Coronary artery disease      male 1st degree relative<60  . Hypertension    . Cancer Sister    Social History  Substance Use Topics  . Smoking status: Former Smoker -- 1.00 packs/day for 30 years    Quit date: 10/26/1976  . Smokeless tobacco: Never Used  . Alcohol Use: No    Review of Systems  All systems reviewed and negative, other than as noted in HPI.   Allergies  Aleve and Ibuprofen  Home Medications   Prior to Admission medications   Medication Sig Start Date End Date Taking? Authorizing Provider  acetaminophen (TYLENOL) 500 MG tablet Take 500 mg by mouth every 6 (six) hours as needed for headache.    Historical Provider, MD  albuterol (PROVENTIL) (2.5 MG/3ML) 0.083% nebulizer solution USE ONE VIAL VIA NEBULIZER FOUR TIMES DAILY AS NEEDED. 07/11/15   Heath Lark, MD  AMBULATORY NON FORMULARY MEDICATION Diltiazem gel 2% Apply small amount pea-sized to anal area 2-3 times a day for anal stenosis. Use as needed. 06/07/15   Aleksei Plotnikov V, MD  Artificial Tear Ointment (DRY EYES OP) Apply 1 drop to eye daily.    Historical  Provider, MD  atorvastatin (LIPITOR) 20 MG tablet TAKE 1/2 TABLET TWICE DAILY Patient taking differently: TAKE 1/2 (10 MG ) TABLET TWICE DAILY 11/10/14   Aleksei Plotnikov V, MD  Cholecalciferol (VITAMIN D3) 1000 UNITS tablet Take 1,000 Units by mouth daily.      Historical Provider, MD  diphenoxylate-atropine (LOMOTIL) 2.5-0.025 MG tablet Take 1 tablet by mouth 4 (four) times daily as needed for diarrhea or loose stools. 06/07/15   Aleksei Plotnikov V, MD  fluticasone (FLONASE) 50 MCG/ACT nasal spray Place 2 sprays into both nostrils daily. 05/26/14   Aleksei  Plotnikov V, MD  folic acid (FOLVITE) Q000111Q MCG tablet Take 400 mcg by mouth daily.     Historical Provider, MD  HYDROcodone-acetaminophen (NORCO/VICODIN) 5-325 MG tablet Take 1 tablet by mouth every 8 (eight) hours. 07/19/15   Virgel Manifold, MD  levothyroxine (SYNTHROID, LEVOTHROID) 112 MCG tablet Take 1 tablet (112 mcg total) by mouth daily. 06/08/15   Aleksei Plotnikov V, MD  metoprolol succinate (TOPROL-XL) 25 MG 24 hr tablet Take 0.5 tablets (12.5 mg total) by mouth 2 (two) times daily. 12/28/14   Aleksei Plotnikov V, MD  nitroGLYCERIN (NITROSTAT) 0.4 MG SL tablet Place 1 tablet (0.4 mg total) under the tongue every 5 (five) minutes as needed. Chest pain 07/26/14 07/24/16  Lew Dawes V, MD  pantoprazole (PROTONIX) 40 MG tablet Take 1 tablet (40 mg total) by mouth daily. 06/07/15   Aleksei Plotnikov V, MD  saccharomyces boulardii (FLORASTOR) 250 MG capsule Take 1 capsule (250 mg total) by mouth 2 (two) times daily. 06/07/15   Aleksei Plotnikov V, MD  traMADol-acetaminophen (ULTRACET) 37.5-325 MG tablet Take 0.5-1 tablets by mouth every 6 (six) hours as needed. 07/04/15   Aleksei Plotnikov V, MD  triamcinolone (NASACORT) 55 MCG/ACT nasal inhaler Place 1 spray into the nose daily. 05/01/12   Aleksei Plotnikov V, MD   BP 136/72 mmHg  Pulse 71  Temp(Src) 97.9 F (36.6 C) (Oral)  Resp 20  SpO2 94% Physical Exam  Constitutional: He appears well-developed and well-nourished. No distress.  HENT:  Head: Normocephalic and atraumatic.  Eyes: Conjunctivae are normal. Right eye exhibits no discharge. Left eye exhibits no discharge.  Neck: Neck supple.  Cardiovascular: Normal rate, regular rhythm and normal heart sounds.  Exam reveals no gallop and no friction rub.   No murmur heard. Pulmonary/Chest: Effort normal and breath sounds normal. No respiratory distress.  Abdominal: Soft. He exhibits no distension. There is no tenderness.  Musculoskeletal: He exhibits no edema or tenderness.  Small sacral wound  which does not appear infected. Some mild tenderness to palpation along the sacrum and near the left SI joint. Pelvis is stable. Denies increased pain with range of motion of the hip with either passive or active range of motion. No shortening or malrotation. Sensation is intact to light touch. Foot is warm with palpable DP pulse.  Neurological: He is alert.  Skin: Skin is warm and dry.  Psychiatric: He has a normal mood and affect. His behavior is normal. Thought content normal.  Nursing note and vitals reviewed.   ED Course  Procedures (including critical care time) Labs Review Labs Reviewed - No data to display  Imaging Review Dg Sacrum/coccyx  07/30/2015  CLINICAL DATA:  Fall with left-sided pain.  Initial encounter. EXAM: SACRUM AND COCCYX - 2+ VIEW COMPARISON:  07/19/2015 left hip CT FINDINGS: Symmetric sacroiliac joints with no diastasis. There is no visible sacral or coccygeal fracture. Imaging of the right hip prosthesis is unremarkable. Osteopenia  and atherosclerosis. IMPRESSION: No acute osseous finding. Electronically Signed   By: Monte Fantasia M.D.   On: 07/30/2015 17:54   Dg Hip Unilat With Pelvis 2-3 Views Left  07/30/2015  CLINICAL DATA:  [place] today.  Left hip pain. EXAM: DG HIP (WITH OR WITHOUT PELVIS) 2-3V LEFT COMPARISON:  07/19/2015 FINDINGS: The right hip prosthesis is intact and stable. The pubic symphysis and SI joints are intact. No definite pelvic fractures. The left hip is normally located. No left hip fracture. IMPRESSION: No acute bony findings. Electronically Signed   By: Marijo Sanes M.D.   On: 07/30/2015 17:54   I have personally reviewed and evaluated these images and lab results as part of my medical decision-making.   EKG Interpretation None      MDM   Final diagnoses:  Back contusion, left, initial encounter    82 year old male with lower back pain after mechanical fall. Some mild tenderness to palpation. No acute neurological complaints. Neuro exam  is nonfocal. Will image. Given dose of home pin meds shortly after arrival.    Imaging negative. Plan symptomatic tx of likely contusion.  6:46 PM Daughter now at bedside. Would like patient admitted because increasing difficulty with ambulation. Legitimate concerns, but unfortunately not a basis to admit. Placement to facility unlikely to happen on Sunday evening. Will see if social work available to assist.    7:27 PM Social work not available at this time. Pt previously at [facility]. Family calling to see if they have space available. Will continue to hold in the ED a little longer. Unfortunately, will be discharged.   Virgel Manifold, MD 07/31/15 [phone]

## 2015-07-30 NOTE — ED Notes (Signed)
Patient family & friend (who is a Adult nurse) requested charge to lobby. Spoke to family and friend in reference to placement of patient into LTC. Explained to family that there is little that can be done from the ER for placement into a facility, and that the best course of action would be to contact their PCP. Family states that they have called the PCP tonight and that the PCP was also unable to make placement tonight. Family stated they had already spoken to Dr Wilson Singer in reference to this situation and he had said the same information as I had. Family requested I speak to him again. I spoke to Dr Wilson Singer who again stated the family would need to follow up with PCP. Suggested to family that someone stay with the patient overnight, possibly taking turns staying awake with patient to ensure that he does not attempt to ambulate unassisted. Offer [facility] for transport, family accepted and provided address for transportation, states they will be present to receive patient at home.

## 2015-07-31 DIAGNOSIS — M9701XD Periprosthetic fracture around internal prosthetic right hip joint, subsequent encounter: Secondary | ICD-10-CM | POA: Diagnosis not present

## 2015-07-31 DIAGNOSIS — N183 Chronic kidney disease, stage 3 (moderate): Secondary | ICD-10-CM | POA: Diagnosis not present

## 2015-07-31 DIAGNOSIS — I129 Hypertensive chronic kidney disease with stage 1 through stage 4 chronic kidney disease, or unspecified chronic kidney disease: Secondary | ICD-10-CM | POA: Diagnosis not present

## 2015-07-31 DIAGNOSIS — E039 Hypothyroidism, unspecified: Secondary | ICD-10-CM | POA: Diagnosis not present

## 2015-07-31 DIAGNOSIS — I5042 Chronic combined systolic (congestive) and diastolic (congestive) heart failure: Secondary | ICD-10-CM | POA: Diagnosis not present

## 2015-07-31 DIAGNOSIS — E785 Hyperlipidemia, unspecified: Secondary | ICD-10-CM | POA: Diagnosis not present

## 2015-08-02 ENCOUNTER — Encounter: Payer: Self-pay | Admitting: Gastroenterology

## 2015-08-02 DIAGNOSIS — M9701XD Periprosthetic fracture around internal prosthetic right hip joint, subsequent encounter: Secondary | ICD-10-CM | POA: Diagnosis not present

## 2015-08-02 DIAGNOSIS — E039 Hypothyroidism, unspecified: Secondary | ICD-10-CM | POA: Diagnosis not present

## 2015-08-02 DIAGNOSIS — I5042 Chronic combined systolic (congestive) and diastolic (congestive) heart failure: Secondary | ICD-10-CM | POA: Diagnosis not present

## 2015-08-02 DIAGNOSIS — E785 Hyperlipidemia, unspecified: Secondary | ICD-10-CM | POA: Diagnosis not present

## 2015-08-02 DIAGNOSIS — N183 Chronic kidney disease, stage 3 (moderate): Secondary | ICD-10-CM | POA: Diagnosis not present

## 2015-08-02 DIAGNOSIS — I129 Hypertensive chronic kidney disease with stage 1 through stage 4 chronic kidney disease, or unspecified chronic kidney disease: Secondary | ICD-10-CM | POA: Diagnosis not present

## 2015-08-03 ENCOUNTER — Telehealth: Payer: Self-pay

## 2015-08-03 NOTE — Telephone Encounter (Signed)
FL2 received completed and given to [place] with transportation company to pick up per Karleen Hampshire at [place] fax [phone]. Copy sent to scan

## 2015-08-04 NOTE — ED Provider Notes (Signed)
CSN: [id number]     Arrival date & time 07/19/15  1629 History   First MD Initiated Contact with Patient 07/19/15 1640     Chief Complaint  Patient presents with  . Hip Pain     (Consider location/radiation/quality/duration/timing/severity/associated sxs/prior Treatment) HPI  82yM with L hip pain. Onset about a week ago. Fell. Persistent pain since then. Has been ambulating but pain is worse. Tolerable at rest. Worse with movement. No numbness or tingling. Denies acute pain elsewhere. No urinary complaints.   Past Medical History  Diagnosis Date  . Cancer ([place])     Stomach  . Collagen vascular disease ([place])   . CHF (congestive heart failure) ([place])   . Hypertension    Past Surgical History  Procedure Laterality Date  . Heart disease      permanent pacemaker  . Appendectomy    . Inguinal hernia repair    . Hip fracture surgery  2011    ORIF  R.  . Pacemaker insertion    . Colonoscopy  12/1998, 02/2004    diverticulosois, external hemorrhoids  . Esophagogastroduodenoscopy  06/17/2014    Dr. Oneida Alar: 1. Patent stricture at the gastroesophageal junction 2. UGIB Due to multiple  gastric ulcers 3. Moderate Duodentitis. Negative H.pylori  . Esophagogastroduodenoscopy N/A 10/17/2014    Procedure: ESOPHAGOGASTRODUODENOSCOPY (EGD);  Surgeon: Danie Binder, MD;  [facility];  Service: Endoscopy;  Laterality: N/A;  1030  . Esophagogastroduodenoscopy N/A 02/10/2015    Procedure: ESOPHAGOGASTRODUODENOSCOPY (EGD);  Surgeon: Danie Binder, MD;  [facility];  Service: Endoscopy;  Laterality: N/A;  115  . Craniotomy Left   . Ep implantable device N/A 06/12/2015    Procedure: Pacemaker Implant ;  Surgeon: Evans Lance, MD;  Location: [facility] [facility];  Service: Cardiovascular;  Laterality: N/A;   Family History  Problem Relation Age of Onset  . Coronary artery disease      male 1st degree relative<60  . Hypertension    . Cancer Sister    Social History   Substance Use Topics  . Smoking status: Former Smoker -- 1.00 packs/day for 30 years    Quit date: 10/26/1976  . Smokeless tobacco: Never Used  . Alcohol Use: No    Review of Systems  All systems reviewed and negative, other than as noted in HPI.   Allergies  Aleve and Ibuprofen  Home Medications   Prior to Admission medications   Medication Sig Start Date End Date Taking? Authorizing Provider  acetaminophen (TYLENOL) 500 MG tablet Take 500 mg by mouth every 6 (six) hours as needed for headache.   Yes Historical Provider, MD  traMADol-acetaminophen (ULTRACET) 37.5-325 MG tablet Take 0.5-1 tablets by mouth every 6 (six) hours as needed. 07/04/15  Yes Aleksei Plotnikov V, MD  albuterol (PROVENTIL) (2.5 MG/3ML) 0.083% nebulizer solution USE ONE VIAL VIA NEBULIZER FOUR TIMES DAILY AS NEEDED. 07/11/15   Heath Lark, MD  AMBULATORY NON FORMULARY MEDICATION Diltiazem gel 2% Apply small amount pea-sized to anal area 2-3 times a day for anal stenosis. Use as needed. 06/07/15   Aleksei Plotnikov V, MD  Artificial Tear Ointment (DRY EYES OP) Apply 1 drop to eye daily.    Historical Provider, MD  atorvastatin (LIPITOR) 20 MG tablet TAKE 1/2 TABLET TWICE DAILY Patient taking differently: TAKE 1/2 (10 MG ) TABLET TWICE DAILY 11/10/14   Aleksei Plotnikov V, MD  Cholecalciferol (VITAMIN D3) 1000 UNITS tablet Take 1,000 Units by mouth daily.  Historical Provider, MD  diphenoxylate-atropine (LOMOTIL) 2.5-0.025 MG tablet Take 1 tablet by mouth 4 (four) times daily as needed for diarrhea or loose stools. 06/07/15   Aleksei Plotnikov V, MD  fluticasone (FLONASE) 50 MCG/ACT nasal spray Place 2 sprays into both nostrils daily. 05/26/14   Aleksei Plotnikov V, MD  folic acid (FOLVITE) Q000111Q MCG tablet Take 400 mcg by mouth daily.     Historical Provider, MD  HYDROcodone-acetaminophen (NORCO/VICODIN) 5-325 MG tablet Take 1 tablet by mouth every 8 (eight) hours. 07/19/15   Virgel Manifold, MD  levothyroxine  (SYNTHROID, LEVOTHROID) 112 MCG tablet Take 1 tablet (112 mcg total) by mouth daily. 06/08/15   Aleksei Plotnikov V, MD  metoprolol succinate (TOPROL-XL) 25 MG 24 hr tablet Take 0.5 tablets (12.5 mg total) by mouth 2 (two) times daily. 12/28/14   Aleksei Plotnikov V, MD  nitroGLYCERIN (NITROSTAT) 0.4 MG SL tablet Place 1 tablet (0.4 mg total) under the tongue every 5 (five) minutes as needed. Chest pain 07/26/14 07/24/16  Lew Dawes V, MD  pantoprazole (PROTONIX) 40 MG tablet Take 1 tablet (40 mg total) by mouth daily. 06/07/15   Aleksei Plotnikov V, MD  saccharomyces boulardii (FLORASTOR) 250 MG capsule Take 1 capsule (250 mg total) by mouth 2 (two) times daily. 06/07/15   Aleksei Plotnikov V, MD  triamcinolone (NASACORT) 55 MCG/ACT nasal inhaler Place 1 spray into the nose daily. 05/01/12   Aleksei Plotnikov V, MD   BP 140/84 mmHg  Pulse 57  Temp(Src) 98 F (36.7 C) (Oral)  Resp 17  Ht 5\' 4"  (1.626 m)  Wt 148 lb (67.132 kg)  BMI 25.39 kg/m2  SpO2 96% Physical Exam  Constitutional: He appears well-developed and well-nourished. No distress.  HENT:  Head: Normocephalic and atraumatic.  Eyes: Conjunctivae are normal. Right eye exhibits no discharge. Left eye exhibits no discharge.  Neck: Neck supple.  Cardiovascular: Normal rate, regular rhythm and normal heart sounds.  Exam reveals no gallop and no friction rub.   No murmur heard. Pulmonary/Chest: Effort normal and breath sounds normal. No respiratory distress.  Abdominal: Soft. He exhibits no distension. There is no tenderness.  Musculoskeletal: He exhibits no edema or tenderness.  Lower extremities grossly normal in appearance. Symmetric. No shortening or malrotation. No bony tenderness. Reports increased pain with both active and passive range of motion of the left hip. NVI. No lower back tenderness both in the midline and paraspinally.  Neurological: He is alert.  Skin: Skin is warm and dry.  Psychiatric: He has a normal mood and  affect. His behavior is normal. Thought content normal.  Nursing note and vitals reviewed.   ED Course  Procedures (including critical care time) Labs Review Labs Reviewed - No data to display  Imaging Review No results found. I have personally reviewed and evaluated these images and lab results as part of my medical decision-making.   EKG Interpretation None      MDM   Final diagnoses:  Left hip pain    82-year-old male with left hip pain. Plain films and CT without evidence of acute osseous injury. Plan symptomatic treatment. Return precautions discussed.    Virgel Manifold, MD 08/04/15 [phone]

## 2015-08-07 ENCOUNTER — Telehealth: Payer: Self-pay

## 2015-08-07 NOTE — Telephone Encounter (Signed)
Form is in MD's red folder for review/signature. [place] is aware.

## 2015-08-07 NOTE — Telephone Encounter (Signed)
Pt moved last week to new residence at [facility] is calling about the fax that was sent over last week regarding the med discrepancies. [phone] is the CBN for [place].

## 2015-08-08 ENCOUNTER — Other Ambulatory Visit (HOSPITAL_COMMUNITY): Payer: Self-pay | Admitting: Interventional Radiology

## 2015-08-08 ENCOUNTER — Ambulatory Visit (HOSPITAL_COMMUNITY)
Admission: RE | Admit: 2015-08-08 | Discharge: 2015-08-08 | Disposition: A | Discharge status: Home / Self Care | Payer: Medicare Other | Source: Ambulatory Visit | Attending: Interventional Radiology | Admitting: Interventional Radiology

## 2015-08-08 DIAGNOSIS — IMO0002 Reserved for concepts with insufficient information to code with codable children: Secondary | ICD-10-CM

## 2015-08-08 DIAGNOSIS — M545 Low back pain: Secondary | ICD-10-CM | POA: Diagnosis not present

## 2015-08-08 DIAGNOSIS — M25552 Pain in left hip: Secondary | ICD-10-CM | POA: Diagnosis not present

## 2015-08-08 DIAGNOSIS — M549 Dorsalgia, unspecified: Secondary | ICD-10-CM

## 2015-08-11 ENCOUNTER — Other Ambulatory Visit (HOSPITAL_COMMUNITY): Payer: Self-pay | Admitting: Interventional Radiology

## 2015-08-11 DIAGNOSIS — IMO0002 Reserved for concepts with insufficient information to code with codable children: Secondary | ICD-10-CM

## 2015-08-11 DIAGNOSIS — M545 Low back pain: Secondary | ICD-10-CM

## 2015-08-18 ENCOUNTER — Other Ambulatory Visit: Payer: Self-pay | Admitting: General Surgery

## 2015-08-22 ENCOUNTER — Ambulatory Visit: Payer: Medicare Other | Admitting: Internal Medicine

## 2015-08-22 ENCOUNTER — Ambulatory Visit (HOSPITAL_COMMUNITY)
Admission: RE | Admit: 2015-08-22 | Discharge: 2015-08-22 | Disposition: A | Discharge status: Home / Self Care | Payer: Medicare Other | Source: Ambulatory Visit | Attending: Interventional Radiology | Admitting: Interventional Radiology

## 2015-08-22 ENCOUNTER — Encounter (HOSPITAL_COMMUNITY): Payer: Self-pay

## 2015-08-22 ENCOUNTER — Other Ambulatory Visit (HOSPITAL_COMMUNITY): Payer: Self-pay | Admitting: Interventional Radiology

## 2015-08-22 DIAGNOSIS — Z950 Presence of cardiac pacemaker: Secondary | ICD-10-CM | POA: Insufficient documentation

## 2015-08-22 DIAGNOSIS — Z888 Allergy status to other drugs, medicaments and biological substances status: Secondary | ICD-10-CM | POA: Insufficient documentation

## 2015-08-22 DIAGNOSIS — Z79899 Other long term (current) drug therapy: Secondary | ICD-10-CM | POA: Insufficient documentation

## 2015-08-22 DIAGNOSIS — S32049A Unspecified fracture of fourth lumbar vertebra, initial encounter for closed fracture: Secondary | ICD-10-CM | POA: Insufficient documentation

## 2015-08-22 DIAGNOSIS — M545 Low back pain: Secondary | ICD-10-CM

## 2015-08-22 DIAGNOSIS — Z85028 Personal history of other malignant neoplasm of stomach: Secondary | ICD-10-CM | POA: Insufficient documentation

## 2015-08-22 DIAGNOSIS — I110 Hypertensive heart disease with heart failure: Secondary | ICD-10-CM | POA: Insufficient documentation

## 2015-08-22 DIAGNOSIS — Z87891 Personal history of nicotine dependence: Secondary | ICD-10-CM | POA: Insufficient documentation

## 2015-08-22 DIAGNOSIS — W19XXXA Unspecified fall, initial encounter: Secondary | ICD-10-CM | POA: Insufficient documentation

## 2015-08-22 DIAGNOSIS — IMO0002 Reserved for concepts with insufficient information to code with codable children: Secondary | ICD-10-CM

## 2015-08-22 DIAGNOSIS — M4846XA Fatigue fracture of vertebra, lumbar region, initial encounter for fracture: Secondary | ICD-10-CM | POA: Diagnosis not present

## 2015-08-22 DIAGNOSIS — M4856XA Collapsed vertebra, not elsewhere classified, lumbar region, initial encounter for fracture: Secondary | ICD-10-CM | POA: Diagnosis not present

## 2015-08-22 DIAGNOSIS — Z8572 Personal history of non-Hodgkin lymphomas: Secondary | ICD-10-CM | POA: Insufficient documentation

## 2015-08-22 DIAGNOSIS — I509 Heart failure, unspecified: Secondary | ICD-10-CM | POA: Diagnosis not present

## 2015-08-22 DIAGNOSIS — Z8249 Family history of ischemic heart disease and other diseases of the circulatory system: Secondary | ICD-10-CM | POA: Insufficient documentation

## 2015-08-22 LAB — CBC
HEMATOCRIT: 34.7 % — AB (ref 39.0–52.0)
HEMOGLOBIN: 11.7 g/dL — AB (ref 13.0–17.0)
MCH: 29.2 pg (ref 26.0–34.0)
MCHC: 33.7 g/dL (ref 30.0–36.0)
MCV: 86.5 fL (ref 78.0–100.0)
Platelets: 233 10*3/uL (ref 150–400)
RBC: 4.01 MIL/uL — AB (ref 4.22–5.81)
RDW: 15.8 % — ABNORMAL HIGH (ref 11.5–15.5)
WBC: 8 10*3/uL (ref 4.0–10.5)

## 2015-08-22 LAB — BASIC METABOLIC PANEL
ANION GAP: 10 (ref 5–15)
BUN: 21 mg/dL — ABNORMAL HIGH (ref 6–20)
CO2: 24 mmol/L (ref 22–32)
Calcium: 8.9 mg/dL (ref 8.9–10.3)
Chloride: 94 mmol/L — ABNORMAL LOW (ref 101–111)
Creatinine, Ser: 1.25 mg/dL — ABNORMAL HIGH (ref 0.61–1.24)
GFR calc non Af Amer: 49 mL/min — ABNORMAL LOW (ref >60)
GFR, EST AFRICAN AMERICAN: 57 mL/min — AB (ref >60)
Glucose, Bld: 89 mg/dL (ref 65–99)
Potassium: 3.6 mmol/L (ref 3.5–5.1)
Sodium: 128 mmol/L — ABNORMAL LOW (ref 135–145)

## 2015-08-22 LAB — PROTIME-INR
INR: 1.17 (ref 0.00–1.49)
Prothrombin Time: 15.1 seconds (ref 11.6–15.2)

## 2015-08-22 LAB — APTT: aPTT: 41 seconds — ABNORMAL HIGH (ref 24–37)

## 2015-08-22 MED ORDER — IOPAMIDOL (ISOVUE-300) INJECTION 61%
INTRAVENOUS | Status: AC
  Administered 2015-08-22: 5 mL
  Filled 2015-08-22: qty 50

## 2015-08-22 MED ORDER — FENTANYL CITRATE (PF) 100 MCG/2ML IJ SOLN
INTRAMUSCULAR | Status: AC | PRN
  Administered 2015-08-22: 25 ug via INTRAVENOUS

## 2015-08-22 MED ORDER — CEFAZOLIN SODIUM-DEXTROSE 2-3 GM-% IV SOLR
INTRAVENOUS | Status: AC
  Administered 2015-08-22: 2000 mg
  Filled 2015-08-22: qty 50

## 2015-08-22 MED ORDER — SODIUM CHLORIDE 0.9 % IV SOLN
INTRAVENOUS | Status: AC | PRN
  Administered 2015-08-22: 10 mL/h via INTRAVENOUS

## 2015-08-22 MED ORDER — SODIUM CHLORIDE 0.9 % IV SOLN: INTRAVENOUS | Status: DC

## 2015-08-22 MED ORDER — FENTANYL CITRATE (PF) 100 MCG/2ML IJ SOLN
INTRAMUSCULAR | Status: AC
  Filled 2015-08-22: qty 6

## 2015-08-22 MED ORDER — BUPIVACAINE HCL (PF) 0.25 % IJ SOLN
INTRAMUSCULAR | Status: AC
  Administered 2015-08-22: 15 mL
  Filled 2015-08-22: qty 30

## 2015-08-22 MED ORDER — MIDAZOLAM HCL 2 MG/2ML IJ SOLN
INTRAMUSCULAR | Status: AC | PRN
  Administered 2015-08-22: 1 mg via INTRAVENOUS

## 2015-08-22 MED ORDER — TOBRAMYCIN SULFATE 1.2 G IJ SOLR
INTRAMUSCULAR | Status: AC
  Administered 2015-08-22: 1.2 g
  Filled 2015-08-22: qty 1.2

## 2015-08-22 MED ORDER — SODIUM CHLORIDE 0.9 % IV SOLN: INTRAVENOUS | Status: AC

## 2015-08-22 MED ORDER — MIDAZOLAM HCL 2 MG/2ML IJ SOLN
INTRAMUSCULAR | Status: AC
  Filled 2015-08-22: qty 6

## 2015-08-22 MED ORDER — HYDROMORPHONE HCL 1 MG/ML IJ SOLN
INTRAMUSCULAR | Status: AC
  Filled 2015-08-22: qty 2

## 2015-08-22 MED ORDER — CEFAZOLIN SODIUM-DEXTROSE 2-4 GM/100ML-% IV SOLN: 2.0000 g | INTRAVENOUS | Status: DC

## 2015-08-22 NOTE — Sedation Documentation (Signed)
Dr. Estanislado Pandy made aware of change in patients BP. All other VS are w/in normal limits, please see chart. Pt continues to remove nasal canula while scratching his nose, RN replaces into correct position, RN also instructed pt to minimize his movement and request assistance from RN.

## 2015-08-22 NOTE — Sedation Documentation (Signed)
Patient denies pain and is resting comfortably.  

## 2015-08-22 NOTE — Sedation Documentation (Signed)
Patient is resting comfortably. 

## 2015-08-22 NOTE — Discharge Instructions (Signed)
1. No stooping,bending or lifting more than 10 lbs for 2 weeks. °2.Use walker to ambulate for 2 weeks . °3.RTC in 2 weeks °KYPHOPLASTY/VERTEBROPLASTY DISCHARGE INSTRUCTIONS ° °Medications: (check all that apply) ° °   Resume all home medications as before procedure. °   °           °  °Continue your pain medications as prescribed as needed.  Over the next 3-5 days, decrease your pain medication as tolerated.  Over the counter medications (i.e. Tylenol, ibuprofen, and aleve) may be substituted once severe/moderate pain symptoms have subsided. ° ° Wound Care: °- Bandages may be removed the day following your procedure.  You may get your incision wet once bandages are removed.  Bandaids may be used to cover the incisions until scab formation.  Topical ointments are optional. ° °- If you develop a fever greater than 101 degrees, have increased skin redness at the incision sites or pus-like oozing from incisions occurring within 1 week of the procedure, contact radiology at [phone] or [phone]. ° °- Ice pack to back for 15-20 minutes 2-3 time per day for first 2-3 days post procedure.  The ice will expedite muscle healing and help with the pain from the incisions. ° ° Activity: °- Bedrest today with limited activity for 24 hours post procedure. ° °- No driving for 48 hours. ° °- Increase your activity as tolerated after bedrest (with assistance if necessary). ° °- Refrain from any strenuous activity or heavy lifting (greater than 10 lbs.). ° ° Follow up: °- Contact radiology at [phone] or [phone] if any questions/concerns. ° °- A physician assistant from radiology will contact you in approximately 1 week. ° °- If a biopsy was performed at the time of your procedure, your referring physician should receive the results in usually 2-3 days. ° ° ° ° ° ° ° ° °

## 2015-08-22 NOTE — H&P (Signed)
Chief Complaint: Patient was seen in consultation today for Lumbar 4 kyphoplasty/vertebroplasty at the request of Dr Virgel Manifold  Referring Physician(s): Dr Virgel Manifold  Supervising Physician: Luanne Bras  Patient Status: Out-pt  History of Present Illness: [name] [name] is a 82 y.o. male   Pt Hx Thoracic 4 fracture and Vertebroplasty 07/04/2014 Has fallen 3-4 times in last year Most recently December 2016 Continues to have Left hip and buttocks pain--worsening All Xrays negative for fracture Bone scan 07/27/2015: IMPRESSION: 1. No loosening infection of the RIGHT hip prosthetic. 2. Uptake in the L4 vertebral body suggests compression fracture.  Pt was referred to Dr Estanislado Pandy and now scheduled for L4 VP/KP Hx Lymphoma   Past Medical History  Diagnosis Date  . Cancer ([place])     Stomach  . Collagen vascular disease ([place])   . CHF (congestive heart failure) ([place])   . Hypertension     Past Surgical History  Procedure Laterality Date  . Heart disease      permanent pacemaker  . Appendectomy    . Inguinal hernia repair    . Hip fracture surgery  2011    ORIF  R.  . Pacemaker insertion    . Colonoscopy  12/1998, 02/2004    diverticulosois, external hemorrhoids  . Esophagogastroduodenoscopy  06/17/2014    Dr. Oneida Alar: 1. Patent stricture at the gastroesophageal junction 2. UGIB Due to multiple  gastric ulcers 3. Moderate Duodentitis. Negative H.pylori  . Esophagogastroduodenoscopy N/A 10/17/2014    Procedure: ESOPHAGOGASTRODUODENOSCOPY (EGD);  Surgeon: Danie Binder, MD;  [facility];  Service: Endoscopy;  Laterality: N/A;  1030  . Esophagogastroduodenoscopy N/A 02/10/2015    Procedure: ESOPHAGOGASTRODUODENOSCOPY (EGD);  Surgeon: Danie Binder, MD;  Location: [facility];  Service: Endoscopy;  Laterality: N/A;  115  . Craniotomy Left   . Ep implantable device N/A 06/12/2015    Procedure: Pacemaker Implant ;  Surgeon: Evans Lance, MD;   Location: [facility];  Service: Cardiovascular;  Laterality: N/A;    Allergies: Aleve and Ibuprofen  Medications: Prior to Admission medications   Medication Sig Start Date End Date Taking? Authorizing Provider  Artificial Tear Ointment (DRY EYES OP) Apply 1 drop to eye daily.   Yes Historical Provider, MD  atorvastatin (LIPITOR) 20 MG tablet TAKE 1/2 TABLET TWICE DAILY Patient taking differently: TAKE 1/2 (10 MG ) TABLET TWICE DAILY 11/10/14  Yes Aleksei Plotnikov V, MD  Cholecalciferol (VITAMIN D3) 1000 UNITS tablet Take 1,000 Units by mouth daily.     Yes Historical Provider, MD  fluticasone (FLONASE) 50 MCG/ACT nasal spray Place 2 sprays into both nostrils daily. 05/26/14  Yes Aleksei Plotnikov V, MD  folic acid (FOLVITE) Q000111Q MCG tablet Take 400 mcg by mouth daily.    Yes Historical Provider, MD  HYDROcodone-acetaminophen (NORCO/VICODIN) 5-325 MG tablet Take 1 tablet by mouth every 8 (eight) hours. 07/19/15  Yes Virgel Manifold, MD  levothyroxine (SYNTHROID, LEVOTHROID) 112 MCG tablet Take 1 tablet (112 mcg total) by mouth daily. 06/08/15  Yes Aleksei Plotnikov V, MD  metoprolol succinate (TOPROL-XL) 25 MG 24 hr tablet Take 0.5 tablets (12.5 mg total) by mouth 2 (two) times daily. 12/28/14  Yes Aleksei Plotnikov V, MD  pantoprazole (PROTONIX) 40 MG tablet Take 1 tablet (40 mg total) by mouth daily. 06/07/15  Yes Aleksei Plotnikov V, MD  saccharomyces boulardii (FLORASTOR) 250 MG capsule Take 1 capsule (250 mg total) by mouth 2 (two) times daily. 06/07/15  Yes Aleksei  Plotnikov V, MD  traMADol-acetaminophen (ULTRACET) 37.5-325 MG tablet Take 0.5-1 tablets by mouth every 6 (six) hours as needed. 07/04/15  Yes Aleksei Plotnikov V, MD  triamcinolone (NASACORT) 55 MCG/ACT nasal inhaler Place 1 spray into the nose daily. 05/01/12  Yes Aleksei Plotnikov V, MD  acetaminophen (TYLENOL) 500 MG tablet Take 500 mg by mouth every 6 (six) hours as needed for headache.    Historical Provider, MD  albuterol  (PROVENTIL) (2.5 MG/3ML) 0.083% nebulizer solution USE ONE VIAL VIA NEBULIZER FOUR TIMES DAILY AS NEEDED. 07/11/15   Heath Lark, MD  AMBULATORY NON FORMULARY MEDICATION Diltiazem gel 2% Apply small amount pea-sized to anal area 2-3 times a day for anal stenosis. Use as needed. 06/07/15   Aleksei Plotnikov V, MD  diphenoxylate-atropine (LOMOTIL) 2.5-0.025 MG tablet Take 1 tablet by mouth 4 (four) times daily as needed for diarrhea or loose stools. 06/07/15   Aleksei Plotnikov V, MD  nitroGLYCERIN (NITROSTAT) 0.4 MG SL tablet Place 1 tablet (0.4 mg total) under the tongue every 5 (five) minutes as needed. Chest pain 07/26/14 07/24/16  Cassandria Anger, MD     Family History  Problem Relation Age of Onset  . Coronary artery disease      male 1st degree relative<60  . Hypertension    . Cancer Sister     Social History   Social History  . Marital Status: Widowed    Spouse Name: N/A  . Number of Children: 2  . Years of Education: N/A   Occupational History  . retired    Social History Main Topics  . Smoking status: Former Smoker -- 1.00 packs/day for 30 years    Quit date: 10/26/1976  . Smokeless tobacco: Never Used  . Alcohol Use: No  . Drug Use: No  . Sexual Activity: Not Currently   Other Topics Concern  . None   Social History Narrative   Regular exercise--no.     Review of Systems: A 12 point ROS discussed and pertinent positives are indicated in the HPI above.  All other systems are negative.  Review of Systems  Constitutional: Positive for activity change, appetite change, fatigue and unexpected weight change. Negative for fever.  Respiratory: Positive for cough. Negative for shortness of breath.   Gastrointestinal: Negative for abdominal pain.  Musculoskeletal: Positive for back pain and gait problem.  Neurological: Positive for weakness.  Psychiatric/Behavioral: Negative for behavioral problems and confusion.    Vital Signs: BP 150/61 mmHg  Pulse 60  Temp(Src)  98.3 F (36.8 C) (Oral)  Resp 18  Ht 5\' 4"  (1.626 m)  Wt 150 lb (68.04 kg)  BMI 25.73 kg/m2  SpO2 95%  Physical Exam  Constitutional: He is oriented to person, place, and time.  Thin and frail  Cardiovascular: Normal rate, regular rhythm and normal heart sounds.   Pulmonary/Chest: Effort normal. He has wheezes.  Abdominal: Soft. Bowel sounds are normal.  Musculoskeletal: Normal range of motion.  Neurological: He is alert and oriented to person, place, and time.  Skin: Skin is warm and dry.  Psychiatric: He has a normal mood and affect. His behavior is normal. Judgment and thought content normal.  Nursing note and vitals reviewed.   Mallampati Score:  MD Evaluation Airway: WNL Heart: WNL Abdomen: WNL Chest/ Lungs: WNL ASA  Classification: 3 Mallampati/Airway Score: One  Imaging: Dg Sacrum/coccyx  07/30/2015  CLINICAL DATA:  Fall with left-sided pain.  Initial encounter. EXAM: SACRUM AND COCCYX - 2+ VIEW COMPARISON:  07/19/2015 left hip CT  FINDINGS: Symmetric sacroiliac joints with no diastasis. There is no visible sacral or coccygeal fracture. Imaging of the right hip prosthesis is unremarkable. Osteopenia and atherosclerosis. IMPRESSION: No acute osseous finding. Electronically Signed   By: Monte Fantasia M.D.   On: 07/30/2015 17:54   Nm Bone Scan 3 Phase  07/27/2015  CLINICAL DATA:  LEFT hip and low back pain. RIGHT hip replacement 4 years prior per EXAM: NUCLEAR MEDICINE 3-PHASE BONE SCAN TECHNIQUE: Radionuclide angiographic images, immediate static blood pool images, and 3-hour delayed static images were obtained of the pelvis after intravenous injection of radiopharmaceutical. RADIOPHARMACEUTICALS:  Twenty-four point side mCi Tc-68m MDP COMPARISON:  None. FINDINGS: Vascular phase: No asymmetric or increased blood flow to the LEFT or RIGHT hips. Blood pool phase: No abnormal blood pool activity. Delayed phase: Mild uptake in the RIGHT greater trochanter region. Uniform uptake in  the L4 vertebral body. IMPRESSION: 1. No loosening infection of the RIGHT hip prosthetic. 2. Uptake in the L4 vertebral body suggests compression fracture. Electronically Signed   By: Suzy Bouchard M.D.   On: 07/27/2015 13:29   Dg Hip Unilat With Pelvis 2-3 Views Left  07/30/2015  CLINICAL DATA:  [place] today.  Left hip pain. EXAM: DG HIP (WITH OR WITHOUT PELVIS) 2-3V LEFT COMPARISON:  07/19/2015 FINDINGS: The right hip prosthesis is intact and stable. The pubic symphysis and SI joints are intact. No definite pelvic fractures. The left hip is normally located. No left hip fracture. IMPRESSION: No acute bony findings. Electronically Signed   By: Marijo Sanes M.D.   On: 07/30/2015 17:54   Ir Radiologist Eval & Mgmt  08/14/2015  EXAM: ESTABLISHED PATIENT OFFICE VISIT CHIEF COMPLAINT: Left hip pain and low back pain due to compression fracture at L4. Current Pain Level: 1-10 HISTORY OF PRESENT ILLNESS: The patient is a 82 year old, right-handed gentleman referred for evaluation for pain relief due to a compression fracture at L4 as revealed on recent nuclear medicine scan. The patient is accompanied by his son and daughter-in-law. The patient's symptoms essentially started sometime in December when he fell and broke his right hip. Subsequent workup revealed fracture in the right hip which was treated conservatively with eventual healing and relief of pain on the right hip. Subsequent to that, he noticed pain in his low back and also in his left hip. However workup which entailed a CT scan of the left hip and also a recent three-phase bone scan revealed no obvious dislocations, fractures or evidence of infection. According to the family, the patient reports significant pain in the posterior aspect of his left hip in his gluteal region and intermittently, the front of his left thigh especially on prolonged standing and sitting. The pain apparently is relieved by patient lying down or sitting in a recliner. Up until  recently, he was able to ambulate with a walker. However of late he has been essentially bound in a wheelchair because of the significant pain. He denies any shooting pain into his lower extremities in a radicular manner. The patient does have urgency issues. However he is able to appreciate bladder sensation. Denies recent incontinence of his bowel or his bladder. The patient denies any UTI symptoms of polyuria, hematuria or dysuria. He did have a UTI infection approximately 2 months ago which was treated with a 7 day course of antibiotics. He denies recent chills or fever or rigors. He claims to be eating well though according to his daughter-in-law, he has lost some weight mostly on account of his  poor eating habits. Past Medical History: Compression fracture at T4. Peripheral atherosclerosis. Pain in his left femur, left hip joint. Degenerative disc disease. Past history of also stomach carcinoma, heart problems and high cholesterol. Medications: Albuterol nebulizer, levothyroxine, diltiazem, Protonix, Florastor, acetaminophen, metoprolol, atorvastatin, nitroglycerin sublingual as needed, Flonase, Nasacort, folic acid, cholecalciferol. Allergies:  Patient with hallucinations with tramadol. Social History:  Patient is married, has 2 children alive and well. Denies drinking alcohol or smoking or using illicit chemicals. Family History: Significant for heart problems, high blood pressure and multiple sclerosis. PHYSICAL EXAMINATION: Patient in a wheelchair in apparent moderate distress on account of his low back and left hip pain. Neurologically fairly alert, awake, oriented to time, place, space and appropriately responsive. Grossly motor and sensory unremarkable. Station and gait not tested. Patient exhibited minimal tenderness to deep palpation in the lower lumbar sacral region. ASSESSMENT AND PLAN: The patient's recent CT of the left hip, and also three-phase bone scan were reviewed. The bone scan is  informative in the sense that it demonstrates diffuse uptake at L4. No other regions of significantly abnormal uptake are seen. The patient and family were explained that L4 was probably a healing fracture though slowly healing and could possibly contribute to the patient's pain. An option was to consider vertebral body augmentation at L4. This may or may not significantly alleviate the patient's left hip pain. Nevertheless it would help in preventing further collapse at L4 given the patient's previous history of compression fractures in the thoracic region. The patient and family are familiar with the vertebral body augmentation procedure. The risks, benefits and alternatives were all reviewed in detail. The patient and the family would like to proceed with vertebral body augmentation at L4. This will be scheduled at the earliest possible to be preceded by a limited CT scan of the lumbar sacral spine from L2-S1 with special attention to L4. Bone window reconstructions in the sagittal coronal planes will be obtained as well as soft tissue windows in the axial plane through the respective disc spaces. Questions were answered to their satisfaction. The patient has been asked to continue with his present regimen of medication, and level of activity. The procedure will be scheduled at the earliest possible. Electronically Signed   By: Luanne Bras M.D.   On: 08/08/2015 15:34    Labs:  CBC:  Recent Labs  05/22/15 0630 06/01/15 0755 06/07/15 1545 08/22/15 0720  WBC 9.0 9.7 12.2* 8.0  HGB 10.6* 11.5* 11.8* 11.7*  HCT 31.5* 33.7* 35.5* 34.7*  PLT 146* 204 242.0 233    COAGS:  Recent Labs  09/27/14 1646 08/22/15 0720  INR 1.0 1.17  APTT  --  41*    BMP:  Recent Labs  05/19/15 0745 05/22/15 0630 06/01/15 0755 06/07/15 1545 08/22/15 0720  NA 133* 136 132* 131* 128*  K 3.8 4.0 4.1 4.3 3.6  CL 102 104 100* 100 94*  CO2 20* 26 25 25 24   GLUCOSE 107* 101* 99 104* 89  BUN 40* 32* 28*  35* 21*  CALCIUM 7.2* 7.6* 8.2* 8.7 8.9  CREATININE 2.12* 1.61* 1.52* 1.57* 1.25*  GFRNONAA 26* 36* 39*  --  49*  GFRAA 30* 42* 45*  --  57*    LIVER FUNCTION TESTS:  Recent Labs  12/30/14 1033 01/27/15 1007 03/12/15 0441 04/06/15 0730 05/12/15 1255  BILITOT 0.97 0.84  --  0.6 0.57  AST 23 22  --  21 16  ALT 14 16  --  16* 12  ALKPHOS 76 78  --  84 73  PROT 6.5 6.7  --  6.3* 6.4  ALBUMIN 3.7 3.7 3.8 3.0* 3.3*    TUMOR MARKERS:  Recent Labs  11/01/14 0514  AFPTM 1.7  CA199 15    Assessment and Plan:  Continued pain and increasing pain since most recent fall approx 5 months ago All xrays negative for fracture New Bone scan does show uptake at L4 CT today being reviewed by Dr Estanislado Pandy Plan for Lumbar 4 VP/KP Risks and Benefits discussed with the patient including, but not limited to education regarding the natural healing process of compression fractures without intervention, bleeding, infection, cement migration which may cause spinal cord damage, paralysis, pulmonary embolism or even death. All of the patient's questions were answered, patient is agreeable to proceed. Consent signed and in chart.    Thank you for this interesting consult.  I greatly enjoyed meeting [name] [name] and look forward to participating in their care.  A copy of this report was sent to the requesting provider on this date.  Electronically Signed: Braelyn Jenson A 08/22/2015, 9:25 AM   I spent a total of  30 Minutes   in face to face in clinical consultation, greater than 50% of which was counseling/coordinating care for L4 VP/KP

## 2015-08-22 NOTE — Procedures (Signed)
S/P L4 VP 

## 2015-08-25 ENCOUNTER — Other Ambulatory Visit (INDEPENDENT_AMBULATORY_CARE_PROVIDER_SITE_OTHER): Payer: Medicare Other

## 2015-08-25 ENCOUNTER — Other Ambulatory Visit (HOSPITAL_COMMUNITY): Payer: Self-pay | Admitting: Interventional Radiology

## 2015-08-25 ENCOUNTER — Encounter: Payer: Self-pay | Admitting: Internal Medicine

## 2015-08-25 ENCOUNTER — Ambulatory Visit (INDEPENDENT_AMBULATORY_CARE_PROVIDER_SITE_OTHER): Payer: Medicare Other | Admitting: Internal Medicine

## 2015-08-25 VITALS — BP 130/70 | HR 73 | Temp 97.4°F | Wt 146.0 lb

## 2015-08-25 DIAGNOSIS — R21 Rash and other nonspecific skin eruption: Secondary | ICD-10-CM

## 2015-08-25 DIAGNOSIS — I10 Essential (primary) hypertension: Secondary | ICD-10-CM

## 2015-08-25 DIAGNOSIS — I255 Ischemic cardiomyopathy: Secondary | ICD-10-CM

## 2015-08-25 DIAGNOSIS — E034 Atrophy of thyroid (acquired): Secondary | ICD-10-CM

## 2015-08-25 DIAGNOSIS — I5042 Chronic combined systolic (congestive) and diastolic (congestive) heart failure: Secondary | ICD-10-CM

## 2015-08-25 DIAGNOSIS — M25579 Pain in unspecified ankle and joints of unspecified foot: Secondary | ICD-10-CM

## 2015-08-25 DIAGNOSIS — M5441 Lumbago with sciatica, right side: Secondary | ICD-10-CM

## 2015-08-25 DIAGNOSIS — IMO0002 Reserved for concepts with insufficient information to code with codable children: Secondary | ICD-10-CM

## 2015-08-25 DIAGNOSIS — E038 Other specified hypothyroidism: Secondary | ICD-10-CM | POA: Diagnosis not present

## 2015-08-25 DIAGNOSIS — M5442 Lumbago with sciatica, left side: Secondary | ICD-10-CM

## 2015-08-25 LAB — URINALYSIS
BILIRUBIN URINE: NEGATIVE
Hgb urine dipstick: NEGATIVE
KETONES UR: NEGATIVE
LEUKOCYTES UA: NEGATIVE
Nitrite: NEGATIVE
PH: 6 (ref 5.0–8.0)
Specific Gravity, Urine: <=1.005 — AB (ref 1.000–1.030)
Total Protein, Urine: NEGATIVE
UROBILINOGEN UA: 0.2 (ref 0.0–1.0)
Urine Glucose: NEGATIVE

## 2015-08-25 MED ORDER — TRIAMCINOLONE ACETONIDE 0.5 % EX OINT: 1.0000 "application " | TOPICAL_OINTMENT | Freq: Two times a day (BID) | CUTANEOUS | Status: DC

## 2015-08-25 MED ORDER — DONEPEZIL HCL 5 MG PO TABS: 5.0000 mg | ORAL_TABLET | Freq: Every day | ORAL | Status: DC

## 2015-08-25 NOTE — Progress Notes (Signed)
Pre visit review using our clinic review tool, if applicable. No additional management support is needed unless otherwise documented below in the visit note. 

## 2015-08-25 NOTE — Progress Notes (Signed)
Subjective:  Patient ID: [name] [name], male    DOB: 11-02-1922  Age: 82 y.o. MRN: [id number]  CC: No chief complaint on file.   HPI TOVIA PEDEN presents for compression fx, back pain, memory issues  Outpatient Prescriptions Prior to Visit  Medication Sig Dispense Refill  . acetaminophen (TYLENOL) 500 MG tablet Take 500 mg by mouth every 6 (six) hours as needed for headache.    . albuterol (PROVENTIL) (2.5 MG/3ML) 0.083% nebulizer solution USE ONE VIAL VIA NEBULIZER FOUR TIMES DAILY AS NEEDED. 375 mL 0  . AMBULATORY NON FORMULARY MEDICATION Diltiazem gel 2% Apply small amount pea-sized to anal area 2-3 times a day for anal stenosis. Use as needed. 30 g 5  . Artificial Tear Ointment (DRY EYES OP) Apply 1 drop to eye daily.    Marland Kitchen atorvastatin (LIPITOR) 20 MG tablet TAKE 1/2 TABLET TWICE DAILY (Patient taking differently: TAKE 1/2 (10 MG ) TABLET TWICE DAILY) 90 tablet 2  . Cholecalciferol (VITAMIN D3) 1000 UNITS tablet Take 1,000 Units by mouth daily.      . diphenoxylate-atropine (LOMOTIL) 2.5-0.025 MG tablet Take 1 tablet by mouth 4 (four) times daily as needed for diarrhea or loose stools. 60 tablet 1  . fluticasone (FLONASE) 50 MCG/ACT nasal spray Place 2 sprays into both nostrils daily. 48 g 3  . folic acid (FOLVITE) Q000111Q MCG tablet Take 400 mcg by mouth daily.     Marland Kitchen levothyroxine (SYNTHROID, LEVOTHROID) 112 MCG tablet Take 1 tablet (112 mcg total) by mouth daily. 30 tablet 11  . metoprolol succinate (TOPROL-XL) 25 MG 24 hr tablet Take 0.5 tablets (12.5 mg total) by mouth 2 (two) times daily. 90 tablet 3  . nitroGLYCERIN (NITROSTAT) 0.4 MG SL tablet Place 1 tablet (0.4 mg total) under the tongue every 5 (five) minutes as needed. Chest pain 20 tablet 2  . pantoprazole (PROTONIX) 40 MG tablet Take 1 tablet (40 mg total) by mouth daily. 90 tablet 3  . saccharomyces boulardii (FLORASTOR) 250 MG capsule Take 1 capsule (250 mg total) by mouth 2 (two) times daily. 60 capsule 2  .  triamcinolone (NASACORT) 55 MCG/ACT nasal inhaler Place 1 spray into the nose daily. 3 Inhaler 1  . traMADol-acetaminophen (ULTRACET) 37.5-325 MG tablet Take 0.5-1 tablets by mouth every 6 (six) hours as needed. 100 tablet 2  . HYDROcodone-acetaminophen (NORCO/VICODIN) 5-325 MG tablet Take 1 tablet by mouth every 8 (eight) hours. (Patient not taking: Reported on 08/25/2015) 12 tablet 0   No facility-administered medications prior to visit.    ROS Review of Systems  Constitutional: Positive for fatigue. Negative for appetite change and unexpected weight change.  HENT: Negative for congestion, nosebleeds, sneezing, sore throat and trouble swallowing.   Eyes: Negative for itching and visual disturbance.  Respiratory: Negative for cough.   Cardiovascular: Negative for chest pain, palpitations and leg swelling.  Gastrointestinal: Negative for nausea, diarrhea, blood in stool and abdominal distention.  Genitourinary: Negative for frequency and hematuria.  Musculoskeletal: Positive for back pain and gait problem. Negative for joint swelling and neck pain.  Skin: Negative for rash.  Neurological: Negative for dizziness, tremors, speech difficulty and weakness.  Psychiatric/Behavioral: Positive for confusion and decreased concentration. Negative for suicidal ideas, sleep disturbance, dysphoric mood and agitation. The patient is nervous/anxious.     Objective:  BP 130/70 mmHg  Pulse 73  Temp(Src) 97.4 F (36.3 C) (Oral)  Wt 146 lb (66.225 kg)  SpO2 96%  BP Readings from Last 3 Encounters:  08/25/15  130/70  08/22/15 146/62  07/30/15 130/77    Wt Readings from Last 3 Encounters:  08/25/15 146 lb (66.225 kg)  08/22/15 150 lb (68.04 kg)  07/19/15 148 lb (67.132 kg)    Physical Exam  Constitutional: He is oriented to person, place, and time. He appears well-developed and well-nourished. No distress.  HENT:  Head: Normocephalic and atraumatic.  Right Ear: External ear normal.  Left Ear:  External ear normal.  Nose: Nose normal.  Mouth/Throat: Oropharynx is clear and moist. No oropharyngeal exudate.  Eyes: Conjunctivae and EOM are normal. Pupils are equal, round, and reactive to light. Right eye exhibits no discharge. Left eye exhibits no discharge. No scleral icterus.  Neck: Normal range of motion. Neck supple. No JVD present. No tracheal deviation present. No thyromegaly present.  Cardiovascular: Normal rate, regular rhythm, normal heart sounds and intact distal pulses.  Exam reveals no gallop and no friction rub.   No murmur heard. Pulmonary/Chest: Effort normal and breath sounds normal. No stridor. No respiratory distress. He has no wheezes. He has no rales. He exhibits no tenderness.  Abdominal: Soft. Bowel sounds are normal. He exhibits no distension and no mass. There is no tenderness. There is no rebound and no guarding.  Genitourinary: Rectum normal, prostate normal and penis normal. Guaiac negative stool. No penile tenderness.  Musculoskeletal: Normal range of motion. He exhibits tenderness. He exhibits no edema.  Lymphadenopathy:    He has no cervical adenopathy.  Neurological: He is alert and oriented to person, place, and time. He has normal reflexes. No cranial nerve deficit. He exhibits normal muscle tone. Coordination abnormal.  Skin: Skin is warm and dry. No rash noted. He is not diaphoretic. No erythema. No pallor.  Psychiatric: He has a normal mood and affect. His behavior is normal. Judgment and thought content normal.  in a w/c Dry skin over B buttocks  Forms were filled out  Lab Results  Component Value Date   WBC 8.0 08/22/2015   HGB 11.7* 08/22/2015   HCT 34.7* 08/22/2015   PLT 233 08/22/2015   GLUCOSE 89 08/22/2015   CHOL 110 05/25/2013   TRIG 67.0 05/25/2013   HDL 40.30 05/25/2013   LDLCALC 56 05/25/2013   ALT 12 05/12/2015   AST 16 05/12/2015   NA 128* 08/22/2015   K 3.6 08/22/2015   CL 94* 08/22/2015   CREATININE 1.25* 08/22/2015   BUN  21* 08/22/2015   CO2 24 08/22/2015   TSH 6.20* 06/07/2015   PSA 3.42 11/28/2011   INR 1.17 08/22/2015    Ct Lumbar Spine Wo Contrast  08/22/2015  CLINICAL DATA:  Bilateral low back pain with sciatica presents on specified. Compression fracture. Pre kyphoplasty evaluation. EXAM: CT LUMBAR SPINE WITHOUT CONTRAST TECHNIQUE: Multidetector CT imaging of the lumbar spine was performed without intravenous contrast administration. Multiplanar CT image reconstructions were also generated. COMPARISON:  Lumbar myelogram CT 05/22/2007, radiographs 06/07/2015 and bone scan 07/27/2015. FINDINGS: Segmentation: Normal. Alignment:  Normal. Vertebrae: There is a biconcave compression fracture at L4 resulting in approximately 50% loss of vertebral body height centrally. There is 5 mm of osseous retropulsion of the inferior endplate. This fracture appears subacute with persistent visualized fracture lines. No other acute fractures are seen. The sacroiliac joints are partially ankylosed. Paraspinal and other soft tissues: There is mild retroperitoneal soft tissue stranding at the level of the L4 fracture. No other significant paraspinal abnormalities are identified. There is extensive aortoiliac atherosclerosis. Multiple bilateral renal cysts are partially imaged. The bladder is  distended. There are chronic pleural calcifications at the right posterior costophrenic angle. Disc levels: L1-2: Loss of disc height with disc bulging and anterior osteophytes. No significant spinal stenosis. L2-3: Disc height is maintained. There is mild disc bulging and anterior osteophyte formation. No spinal stenosis or nerve root encroachment. L3-4: Disc height is maintained. There is mild disc bulging, facet and ligamentous hypertrophy. No significant spinal stenosis or nerve root encroachment. L4-5: Disc height is maintained. There is disc bulging with facet and ligamentous hypertrophy. Due to these factors and the osseous retropulsion from the  L4 fracture, there is resulting mild spinal stenosis with mild narrowing of lateral recesses and foramina bilaterally. L5-S1: The disc is degenerated with vacuum phenomenon. There is mild bilateral facet hypertrophy. No significant spinal stenosis or nerve root encroachment. IMPRESSION: 1. Subacute approximately 50% compression fracture at L4 with mild osseous retropulsion as described. 2. No other acute or significant osseous findings. 3. Relatively mild multilevel spondylosis with disc bulging and osteophytes. In part due to the L4 fracture, there is mild multifactorial spinal stenosis at L4-5. Electronically Signed   By: Richardean Sale M.D.   On: 08/22/2015 09:49   Ir Vertebroplasty Lumbar Bx Inc Uni/bil Inc Inject/imaging  08/22/2015  INDICATION: Severe low back pain secondary to compression fracture at L4. EXAM: IR VERTEBROPLASTY LUMBOSACRAL INJ: MEDICATIONS: As antibiotic prophylaxis, 2 g Ancef IV was ordered pre-procedure and administered intravenously within 1 hour of incision. ANESTHESIA/SEDATION: Moderate (conscious) sedation was employed during this procedure. A total of Versed 2 mg and Fentanyl 50 mcg was administered intravenously. Moderate Sedation Time: 30 minutes. The patient's level of consciousness and vital signs were monitored continuously by radiology nursing throughout the procedure under my direct supervision. FLUOROSCOPY TIME:  Fluoroscopy Time: 10 minutes 24 seconds (0000000 mGy) COMPLICATIONS: None immediate. TECHNIQUE: Informed written consent was obtained from the patient after a thorough discussion of the procedural risks, benefits and alternatives. All questions were addressed. Maximal Sterile Barrier Technique was utilized including caps, mask, sterile gowns, sterile gloves, sterile drape, hand hygiene and skin antiseptic. A timeout was performed prior to the initiation of the procedure. PROCEDURE: The patient was placed prone on the fluoroscopic table. Nasal oxygen was administered.  Physiologic monitoring was performed throughout the duration of the procedure. The skin overlying the lumbar region was prepped and draped in the usual sterile fashion. The L4 vertebral body was identified and the right and left pedicles were infiltrated with 0.25% Bupivacaine. This was then followed by the advancement of a 13-gauge Cook needle through each pedicle into the anterior one-third at L4. A gentle contrast injection demonstrated a trabecular pattern of contrast. At this time, methylmethacrylate mixture was reconstituted. Under biplane intermittent fluoroscopy, the methylmethacrylate was then injected into the L4 vertebral body with filling of the vertebral body. No extravasation was noted into the disk spaces or posteriorly into the spinal canal. No epidural venous contamination was seen. The needles were then removed. Hemostasis was achieved at the skin entry sites. There were no acute complications. Patient tolerated the procedure well. The patient was observed for 3 hours and discharged in good condition. IMPRESSION: 1. Status post vertebral body augmentation for painful compression fracture at L4 using vertebroplasty technique. Electronically Signed   By: Luanne Bras M.D.   On: 08/22/2015 10:48    Assessment & Plan:   Diagnoses and all orders for this visit:  Pain in joint, ankle and foot, unspecified laterality -     Urinalysis; Future -     Ambulatory  referral to Podiatry  Other orders -     donepezil (ARICEPT) 5 MG tablet; Take 1 tablet (5 mg total) by mouth at bedtime. -     triamcinolone ointment (KENALOG) 0.5 %; Apply 1 application topically 2 (two) times daily. To the rash on buttocks  I have discontinued Mr. Sposato traMADol-acetaminophen and HYDROcodone-acetaminophen. I am also having him start on donepezil and triamcinolone ointment. Additionally, I am having him maintain his folic acid, cholecalciferol, triamcinolone, fluticasone, nitroGLYCERIN, atorvastatin,  metoprolol succinate, acetaminophen, Artificial Tear Ointment (DRY EYES OP), AMBULATORY NON FORMULARY MEDICATION, pantoprazole, saccharomyces boulardii, diphenoxylate-atropine, levothyroxine, and albuterol.  Meds ordered this encounter  Medications  . donepezil (ARICEPT) 5 MG tablet    Sig: Take 1 tablet (5 mg total) by mouth at bedtime.    Dispense:  90 tablet    Refill:  3  . triamcinolone ointment (KENALOG) 0.5 %    Sig: Apply 1 application topically 2 (two) times daily. To the rash on buttocks    Dispense:  45 g    Refill:  3     Follow-up: Return in about 2 months (around 10/25/2015) for a follow-up visit.  Walker Kehr, MD

## 2015-08-26 DIAGNOSIS — R21 Rash and other nonspecific skin eruption: Secondary | ICD-10-CM | POA: Insufficient documentation

## 2015-08-26 NOTE — Assessment & Plan Note (Signed)
On Levothroid 

## 2015-08-26 NOTE — Assessment & Plan Note (Signed)
6/17 Dry skin on buttocks/sacrum area. No ulcers D/c diapers rash paste. Start Kenalog oint Position changes to prevent pressure ulcers

## 2015-08-26 NOTE — Assessment & Plan Note (Signed)
On Metoprolol 

## 2015-08-26 NOTE — Assessment & Plan Note (Signed)
S/p vertebroplasty - improving Rx for hosp bed

## 2015-08-26 NOTE — Assessment & Plan Note (Signed)
No relapse 

## 2015-08-29 ENCOUNTER — Encounter: Payer: Medicare Other | Admitting: Internal Medicine

## 2015-09-12 ENCOUNTER — Ambulatory Visit (HOSPITAL_COMMUNITY)
Admission: RE | Admit: 2015-09-12 | Discharge: 2015-09-12 | Disposition: A | Discharge status: Home / Self Care | Payer: Medicare Other | Source: Ambulatory Visit | Attending: Interventional Radiology | Admitting: Interventional Radiology

## 2015-09-12 DIAGNOSIS — M25552 Pain in left hip: Secondary | ICD-10-CM | POA: Diagnosis not present

## 2015-09-12 DIAGNOSIS — M545 Low back pain: Secondary | ICD-10-CM | POA: Diagnosis not present

## 2015-09-12 DIAGNOSIS — IMO0002 Reserved for concepts with insufficient information to code with codable children: Secondary | ICD-10-CM

## 2015-09-13 ENCOUNTER — Other Ambulatory Visit (HOSPITAL_COMMUNITY): Payer: Self-pay | Admitting: Interventional Radiology

## 2015-09-13 DIAGNOSIS — M545 Low back pain: Secondary | ICD-10-CM

## 2015-09-13 DIAGNOSIS — M549 Dorsalgia, unspecified: Secondary | ICD-10-CM

## 2015-09-14 ENCOUNTER — Telehealth: Payer: Self-pay | Admitting: Hematology and Oncology

## 2015-09-14 NOTE — Telephone Encounter (Signed)
returned call and lvm confirming appt moved to 9.25 per request.

## 2015-09-18 ENCOUNTER — Ambulatory Visit (INDEPENDENT_AMBULATORY_CARE_PROVIDER_SITE_OTHER): Payer: Medicare Other | Admitting: Internal Medicine

## 2015-09-18 ENCOUNTER — Encounter: Payer: Self-pay | Admitting: Internal Medicine

## 2015-09-18 VITALS — BP 132/58 | HR 63 | Ht 64.0 in | Wt 150.0 lb

## 2015-09-18 DIAGNOSIS — I495 Sick sinus syndrome: Secondary | ICD-10-CM

## 2015-09-18 DIAGNOSIS — I255 Ischemic cardiomyopathy: Secondary | ICD-10-CM | POA: Diagnosis not present

## 2015-09-18 NOTE — Progress Notes (Signed)
HPI [name] [name] returns today for followup. He is a pleasant elderly man with an ICM, chronic systolic CHF, s/p ICD implant. He has class 2 CHF symptoms. No syncope. No edema.  He notes that he occasionally uses his nebulizer to help with his wheezing. He walks regularly. No chest pain. He is in rehab in a SNF. He has undergone device generator change out. He has had no trouble healing his pocket. His main problem is pain in his left leg. Allergies  Allergen Reactions  . Aleve [Naproxen Sodium] Other (See Comments)    Bleeding ulcers  . Ibuprofen Other (See Comments)    Bleeding ulcers  . Tramadol     Hallucinations      Current Outpatient Prescriptions  Medication Sig Dispense Refill  . acetaminophen (TYLENOL) 500 MG tablet Take 500 mg by mouth every 6 (six) hours as needed for headache.    . albuterol (PROVENTIL) (2.5 MG/3ML) 0.083% nebulizer solution USE ONE VIAL VIA NEBULIZER FOUR TIMES DAILY AS NEEDED. 375 mL 0  . AMBULATORY NON FORMULARY MEDICATION Diltiazem gel 2% Apply small amount pea-sized to anal area 2-3 times a day for anal stenosis. Use as needed. 30 g 5  . Artificial Tear Ointment (DRY EYES OP) Apply 1 drop to eye daily.    Marland Kitchen atorvastatin (LIPITOR) 20 MG tablet TAKE 1/2 TABLET TWICE DAILY (Patient taking differently: TAKE 1/2 (10 MG ) TABLET TWICE DAILY) 90 tablet 2  . Cholecalciferol (VITAMIN D3) 1000 UNITS tablet Take 1,000 Units by mouth daily.      . diphenoxylate-atropine (LOMOTIL) 2.5-0.025 MG tablet Take 1 tablet by mouth 4 (four) times daily as needed for diarrhea or loose stools. 60 tablet 1  . donepezil (ARICEPT) 5 MG tablet Take 1 tablet (5 mg total) by mouth at bedtime. 90 tablet 3  . fluticasone (FLONASE) 50 MCG/ACT nasal spray Place 2 sprays into both nostrils daily. 48 g 3  . folic acid (FOLVITE) Q000111Q MCG tablet Take 400 mcg by mouth daily.     Marland Kitchen levothyroxine (SYNTHROID, LEVOTHROID) 112 MCG tablet Take 1 tablet (112 mcg total) by mouth daily. 30 tablet 11  .  metoprolol succinate (TOPROL-XL) 25 MG 24 hr tablet Take 0.5 tablets (12.5 mg total) by mouth 2 (two) times daily. 90 tablet 3  . nitroGLYCERIN (NITROSTAT) 0.4 MG SL tablet Place 1 tablet (0.4 mg total) under the tongue every 5 (five) minutes as needed. Chest pain 20 tablet 2  . pantoprazole (PROTONIX) 40 MG tablet Take 1 tablet (40 mg total) by mouth daily. 90 tablet 3  . saccharomyces boulardii (FLORASTOR) 250 MG capsule Take 1 capsule (250 mg total) by mouth 2 (two) times daily. 60 capsule 2  . triamcinolone (NASACORT) 55 MCG/ACT nasal inhaler Place 1 spray into the nose daily. 3 Inhaler 1  . triamcinolone ointment (KENALOG) 0.5 % Apply 1 application topically 2 (two) times daily. To the rash on buttocks 45 g 3   No current facility-administered medications for this visit.   PMHX Chronic systolic heart failure Sinus node dysfunction    ROS:   All systems reviewed and negative except as noted in the HPI.   Past Surgical History  Procedure Laterality Date  . Heart disease      permanent pacemaker  . Appendectomy    . Inguinal hernia repair    . Hip fracture surgery  2011    ORIF  R.  . Pacemaker insertion    . Colonoscopy  12/1998, 02/2004    diverticulosois,  external hemorrhoids  . Esophagogastroduodenoscopy  06/17/2014    Dr. Oneida Alar: 1. Patent stricture at the gastroesophageal junction 2. UGIB Due to multiple  gastric ulcers 3. Moderate Duodentitis. Negative H.pylori  . Esophagogastroduodenoscopy N/A 10/17/2014    Procedure: ESOPHAGOGASTRODUODENOSCOPY (EGD);  Surgeon: Danie Binder, MD;  Location: [facility];  Service: Endoscopy;  Laterality: N/A;  1030  . Esophagogastroduodenoscopy N/A 02/10/2015    Procedure: ESOPHAGOGASTRODUODENOSCOPY (EGD);  Surgeon: Danie Binder, MD;  Location: [facility];  Service: Endoscopy;  Laterality: N/A;  115  . Craniotomy Left   . Ep implantable device N/A 06/12/2015    Procedure: Pacemaker Implant ;  Surgeon: Evans Lance, MD;   Location: [facility];  Service: Cardiovascular;  Laterality: N/A;     Family History  Problem Relation Age of Onset  . Coronary artery disease      male 1st degree relative<60  . Hypertension    . Cancer Sister      Social History   Social History  . Marital Status: Widowed    Spouse Name: N/A  . Number of Children: 2  . Years of Education: N/A   Occupational History  . retired    Social History Main Topics  . Smoking status: Former Smoker -- 1.00 packs/day for 30 years    Quit date: 10/26/1976  . Smokeless tobacco: Never Used  . Alcohol Use: No  . Drug Use: No  . Sexual Activity: Not Currently   Other Topics Concern  . Not on file   Social History Narrative   Regular exercise--no.     BP 132/58 mmHg  Pulse 63  Ht 5\' 4"  (1.626 m)  Wt 150 lb (68.04 kg)  BMI 25.73 kg/m2  SpO2 97%  Physical Exam: Chronically ill appearing elderly man, NAD HEENT: Unremarkable Neck:   7 cm JVD, no thyromegally Lungs:  Clear with no wheezes, or rhonchi. Rales in left base. HEART:  Regular rate rhythm, no murmurs, no rubs, no clicks Abd:  soft, positive bowel sounds, no organomegally, no rebound, no guarding Ext:  2 plus pulses, no edema, no cyanosis, no clubbing Skin:  No rashes no nodules Neuro:  CN II through XII intact, motor grossly intact   DEVICE  Normal device function.  See PaceArt for details.   Assess/Plan:  1. Chronic systolic heart failure - he appears euvolemic. No medication change 2. S/p ICD implant - he has undergone downgrade from a DDD ICD to a DDD PM.  3. Sinus node dysfunction -for DDD PM.  [name].D.

## 2015-09-18 NOTE — Patient Instructions (Addendum)
Medication Instructions:  Your physician recommends that you continue on your current medications as directed. Please refer to the Current Medication list given to you today.   Labwork: NONE  Testing/Procedures: NONE  Follow-Up: Your physician wants you to follow-up in: REMOTE  DEVICE  CHECK  12-18-15 AND  9 MONTHS  WITH DR  Knox Saliva will receive a reminder letter in the mail two months in advance. If you don't receive a letter, please call our office to schedule the follow-up appointment.  Any Other Special Instructions Will Be Listed Below (If Applicable).     If you need a refill on your cardiac medications before your next appointment, please call your pharmacy.

## 2015-09-19 LAB — CUP PACEART INCLINIC DEVICE CHECK
Battery Remaining Longevity: 147 mo
Battery Voltage: 2.79 V
Brady Statistic AS VS Percent: 19 %
Implantable Lead Implant Date: 20031204
Implantable Lead Location: 753859
Implantable Lead Model: 5076
Implantable Lead Model: 6947
Lead Channel Impedance Value: 498 Ohm
Lead Channel Pacing Threshold Amplitude: 0.5 V
Lead Channel Pacing Threshold Amplitude: 0.625 V
Lead Channel Pacing Threshold Amplitude: 0.875 V
Lead Channel Pacing Threshold Pulse Width: 0.4 ms
Lead Channel Pacing Threshold Pulse Width: 0.4 ms
Lead Channel Pacing Threshold Pulse Width: 0.4 ms
Lead Channel Sensing Intrinsic Amplitude: 15.67 mV
Lead Channel Sensing Intrinsic Amplitude: 2.8 mV
Lead Channel Setting Pacing Amplitude: 2.5 V
MDC IDC LEAD IMPLANT DT: 20031204
MDC IDC LEAD LOCATION: 753860
MDC IDC MSMT BATTERY IMPEDANCE: 100 Ohm
MDC IDC MSMT LEADCHNL RA PACING THRESHOLD PULSEWIDTH: 0.4 ms
MDC IDC MSMT LEADCHNL RV IMPEDANCE VALUE: 429 Ohm
MDC IDC MSMT LEADCHNL RV PACING THRESHOLD AMPLITUDE: 1 V
MDC IDC SESS DTM: 20170626164559
MDC IDC SET LEADCHNL RA PACING AMPLITUDE: 2 V
MDC IDC SET LEADCHNL RV PACING PULSEWIDTH: 0.4 ms
MDC IDC SET LEADCHNL RV SENSING SENSITIVITY: 5.6 mV
MDC IDC STAT BRADY AP VP PERCENT: 0 %
MDC IDC STAT BRADY AP VS PERCENT: 80 %
MDC IDC STAT BRADY AS VP PERCENT: 0 %

## 2015-09-20 ENCOUNTER — Ambulatory Visit (INDEPENDENT_AMBULATORY_CARE_PROVIDER_SITE_OTHER): Payer: Medicare Other | Admitting: Podiatry

## 2015-09-20 ENCOUNTER — Encounter: Payer: Self-pay | Admitting: Podiatry

## 2015-09-20 VITALS — BP 142/68 | HR 74 | Resp 16

## 2015-09-20 DIAGNOSIS — I255 Ischemic cardiomyopathy: Secondary | ICD-10-CM | POA: Diagnosis not present

## 2015-09-20 DIAGNOSIS — M79674 Pain in right toe(s): Secondary | ICD-10-CM | POA: Diagnosis not present

## 2015-09-20 DIAGNOSIS — M79605 Pain in left leg: Secondary | ICD-10-CM | POA: Diagnosis not present

## 2015-09-20 DIAGNOSIS — M79675 Pain in left toe(s): Secondary | ICD-10-CM

## 2015-09-20 DIAGNOSIS — M79604 Pain in right leg: Secondary | ICD-10-CM

## 2015-09-20 DIAGNOSIS — M79672 Pain in left foot: Principal | ICD-10-CM

## 2015-09-20 DIAGNOSIS — M79671 Pain in right foot: Secondary | ICD-10-CM | POA: Diagnosis not present

## 2015-09-20 DIAGNOSIS — B351 Tinea unguium: Secondary | ICD-10-CM | POA: Diagnosis not present

## 2015-09-20 NOTE — Progress Notes (Signed)
   Subjective:    Patient ID: [name] [name], male    DOB: 01/23/1923, 82 y.o.   MRN: [id number]  HPI    Review of Systems  All other systems reviewed and are negative.      Objective:   Physical Exam        Assessment & Plan:

## 2015-09-21 ENCOUNTER — Encounter (HOSPITAL_COMMUNITY): Payer: Self-pay | Admitting: *Deleted

## 2015-09-21 ENCOUNTER — Encounter (HOSPITAL_COMMUNITY)
Admission: RE | Admit: 2015-09-21 | Discharge: 2015-09-21 | Disposition: A | Discharge status: Home / Self Care | Payer: Medicare Other | Source: Ambulatory Visit | Attending: Interventional Radiology | Admitting: Interventional Radiology

## 2015-09-21 ENCOUNTER — Telehealth: Payer: Self-pay

## 2015-09-21 ENCOUNTER — Emergency Department (HOSPITAL_COMMUNITY)
Admission: EM | Admit: 2015-09-21 | Discharge: 2015-09-21 | Disposition: A | Discharge status: Home / Self Care | Payer: Medicare Other | Attending: Dermatology | Admitting: Dermatology

## 2015-09-21 DIAGNOSIS — I509 Heart failure, unspecified: Secondary | ICD-10-CM | POA: Diagnosis not present

## 2015-09-21 DIAGNOSIS — Z87891 Personal history of nicotine dependence: Secondary | ICD-10-CM | POA: Diagnosis not present

## 2015-09-21 DIAGNOSIS — Z5321 Procedure and treatment not carried out due to patient leaving prior to being seen by health care provider: Secondary | ICD-10-CM | POA: Diagnosis not present

## 2015-09-21 DIAGNOSIS — M545 Low back pain: Secondary | ICD-10-CM | POA: Insufficient documentation

## 2015-09-21 DIAGNOSIS — M79605 Pain in left leg: Secondary | ICD-10-CM | POA: Diagnosis present

## 2015-09-21 DIAGNOSIS — I110 Hypertensive heart disease with heart failure: Secondary | ICD-10-CM | POA: Insufficient documentation

## 2015-09-21 DIAGNOSIS — Z950 Presence of cardiac pacemaker: Secondary | ICD-10-CM | POA: Diagnosis not present

## 2015-09-21 DIAGNOSIS — Z859 Personal history of malignant neoplasm, unspecified: Secondary | ICD-10-CM | POA: Diagnosis not present

## 2015-09-21 MED ORDER — TECHNETIUM TC 99M MEDRONATE IV KIT
25.0000 | PACK | Freq: Once | INTRAVENOUS | Status: AC | PRN
  Administered 2015-09-21: 25 via INTRAVENOUS

## 2015-09-21 NOTE — ED Notes (Signed)
Pt leaving pt says " I'm not having pain any more".

## 2015-09-21 NOTE — ED Notes (Signed)
Pt was in radiology getting a bone scan and had difficulty getting up from the table. Reports worsening chronic left leg pain. Family states that pt has compression fractures and had cement placed, states the scan was to determine if the fractures are better. States the pain has not gotten better. Pt took tylenol in lobby for pain with some relief.

## 2015-09-21 NOTE — Progress Notes (Signed)
Subjective:     Patient ID: [name] [name], male   DOB: Aug 04, 1922, 82 y.o.   MRN: [id number]  HPI patient presents stating he has elongated nailbeds 1-5 both feet that are painful and making it difficult to wear shoe gear comfortably   Review of Systems  All other systems reviewed and are negative.      Objective:   Physical Exam  Constitutional: He is oriented to person, place, and time.  Cardiovascular: Intact distal pulses.   Musculoskeletal: Normal range of motion.  Neurological: He is oriented to person, place, and time.  Skin: Skin is warm and dry.  Nursing note and vitals reviewed.  neurovascular status found to be diminished but intact with patient noted to have dry skin bilateral diminished Fill time and severely thickened nailbeds 1-5 both feet that are incurvated and painful. He has mild claudication symptoms but not significant with no open sores or lesions. Patient's noted to be well oriented 3     Assessment:     Mycotic nail infection with pain 1-5 both feet with mild claudication symptoms    Plan:     H&P and discussed the continuation of expectant on a daily basis and today I debrided nailbeds 1-5 both feet with no iatrogenic bleeding noted. Reappoint for routine care

## 2015-09-21 NOTE — Telephone Encounter (Signed)
A user error has taken place: Error °

## 2015-10-09 ENCOUNTER — Other Ambulatory Visit (HOSPITAL_COMMUNITY): Payer: Self-pay | Admitting: Interventional Radiology

## 2015-10-09 DIAGNOSIS — IMO0002 Reserved for concepts with insufficient information to code with codable children: Secondary | ICD-10-CM

## 2015-10-09 DIAGNOSIS — M549 Dorsalgia, unspecified: Secondary | ICD-10-CM

## 2015-10-10 ENCOUNTER — Other Ambulatory Visit: Payer: Self-pay | Admitting: Radiology

## 2015-10-11 ENCOUNTER — Telehealth: Payer: Self-pay

## 2015-10-11 ENCOUNTER — Other Ambulatory Visit: Payer: Self-pay | Admitting: Radiology

## 2015-10-11 NOTE — Telephone Encounter (Signed)
Please provide a written Rx for hospital bed with a gel mattress and a pull bar with dx codes.

## 2015-10-11 NOTE — Telephone Encounter (Signed)
Patient needs an rx for a hospital bed with a gel mattress and a pull bar. As well as an rx for a wheelchair. Daughter said there is a place in [place] who will take care of all this. She just wants the nurse to call so she can explain everything. She also wants to come here and pick them up, They do not want it sent anywhere. Please call Butch Penny [phone]

## 2015-10-11 NOTE — Telephone Encounter (Signed)
Ok Pls type Rx Thx

## 2015-10-12 ENCOUNTER — Encounter (HOSPITAL_COMMUNITY): Payer: Self-pay

## 2015-10-12 ENCOUNTER — Other Ambulatory Visit (HOSPITAL_COMMUNITY): Payer: Self-pay | Admitting: Interventional Radiology

## 2015-10-12 ENCOUNTER — Ambulatory Visit (HOSPITAL_COMMUNITY)
Admission: RE | Admit: 2015-10-12 | Discharge: 2015-10-12 | Disposition: A | Discharge status: Home / Self Care | Payer: Medicare Other | Source: Ambulatory Visit | Attending: Interventional Radiology | Admitting: Interventional Radiology

## 2015-10-12 DIAGNOSIS — M549 Dorsalgia, unspecified: Secondary | ICD-10-CM

## 2015-10-12 DIAGNOSIS — Z85028 Personal history of other malignant neoplasm of stomach: Secondary | ICD-10-CM | POA: Insufficient documentation

## 2015-10-12 DIAGNOSIS — M4858XA Collapsed vertebra, not elsewhere classified, sacral and sacrococcygeal region, initial encounter for fracture: Secondary | ICD-10-CM | POA: Diagnosis not present

## 2015-10-12 DIAGNOSIS — R52 Pain, unspecified: Secondary | ICD-10-CM

## 2015-10-12 DIAGNOSIS — IMO0002 Reserved for concepts with insufficient information to code with codable children: Secondary | ICD-10-CM

## 2015-10-12 DIAGNOSIS — I110 Hypertensive heart disease with heart failure: Secondary | ICD-10-CM | POA: Diagnosis not present

## 2015-10-12 DIAGNOSIS — Z87891 Personal history of nicotine dependence: Secondary | ICD-10-CM | POA: Diagnosis not present

## 2015-10-12 DIAGNOSIS — I509 Heart failure, unspecified: Secondary | ICD-10-CM | POA: Diagnosis not present

## 2015-10-12 DIAGNOSIS — Z7951 Long term (current) use of inhaled steroids: Secondary | ICD-10-CM | POA: Insufficient documentation

## 2015-10-12 DIAGNOSIS — Z8249 Family history of ischemic heart disease and other diseases of the circulatory system: Secondary | ICD-10-CM | POA: Insufficient documentation

## 2015-10-12 DIAGNOSIS — Z950 Presence of cardiac pacemaker: Secondary | ICD-10-CM | POA: Insufficient documentation

## 2015-10-12 LAB — CBC
HEMATOCRIT: 36.2 % — AB (ref 39.0–52.0)
HEMOGLOBIN: 12 g/dL — AB (ref 13.0–17.0)
MCH: 30.5 pg (ref 26.0–34.0)
MCHC: 33.1 g/dL (ref 30.0–36.0)
MCV: 92.1 fL (ref 78.0–100.0)
Platelets: 170 10*3/uL (ref 150–400)
RBC: 3.93 MIL/uL — AB (ref 4.22–5.81)
RDW: 14.3 % (ref 11.5–15.5)
WBC: 8.7 10*3/uL (ref 4.0–10.5)

## 2015-10-12 LAB — APTT: APTT: 39 s — AB (ref 24–37)

## 2015-10-12 LAB — PROTIME-INR
INR: 1.15 (ref 0.00–1.49)
Prothrombin Time: 14.9 seconds (ref 11.6–15.2)

## 2015-10-12 LAB — BASIC METABOLIC PANEL
ANION GAP: 7 (ref 5–15)
BUN: 38 mg/dL — ABNORMAL HIGH (ref 6–20)
CO2: 24 mmol/L (ref 22–32)
Calcium: 9.1 mg/dL (ref 8.9–10.3)
Chloride: 99 mmol/L — ABNORMAL LOW (ref 101–111)
Creatinine, Ser: 1.65 mg/dL — ABNORMAL HIGH (ref 0.61–1.24)
GFR, EST AFRICAN AMERICAN: 40 mL/min — AB (ref >60)
GFR, EST NON AFRICAN AMERICAN: 35 mL/min — AB (ref >60)
GLUCOSE: 97 mg/dL (ref 65–99)
POTASSIUM: 3.9 mmol/L (ref 3.5–5.1)
SODIUM: 130 mmol/L — AB (ref 135–145)

## 2015-10-12 MED ORDER — FENTANYL CITRATE (PF) 100 MCG/2ML IJ SOLN
INTRAMUSCULAR | Status: AC | PRN
  Administered 2015-10-12: 25 ug via INTRAVENOUS
  Administered 2015-10-12: 12.5 ug via INTRAVENOUS

## 2015-10-12 MED ORDER — IOPAMIDOL (ISOVUE-300) INJECTION 61%
INTRAVENOUS | Status: AC
  Administered 2015-10-12: 20 mL
  Filled 2015-10-12: qty 50

## 2015-10-12 MED ORDER — SODIUM CHLORIDE 0.9 % IV SOLN
INTRAVENOUS | Status: DC
  Administered 2015-10-12: 09:00:00 via INTRAVENOUS

## 2015-10-12 MED ORDER — BUPIVACAINE HCL (PF) 0.25 % IJ SOLN
INTRAMUSCULAR | Status: AC
  Filled 2015-10-12: qty 30

## 2015-10-12 MED ORDER — SODIUM CHLORIDE 0.9 % IV SOLN: INTRAVENOUS | Status: AC

## 2015-10-12 MED ORDER — MIDAZOLAM HCL 2 MG/2ML IJ SOLN
INTRAMUSCULAR | Status: AC
  Filled 2015-10-12: qty 2

## 2015-10-12 MED ORDER — BUPIVACAINE HCL 0.25 % IJ SOLN
INTRAMUSCULAR | Status: AC | PRN
  Administered 2015-10-12: 30 mL

## 2015-10-12 MED ORDER — TOBRAMYCIN SULFATE 1.2 G IJ SOLR
INTRAMUSCULAR | Status: AC
  Filled 2015-10-12: qty 1.2

## 2015-10-12 MED ORDER — FENTANYL CITRATE (PF) 100 MCG/2ML IJ SOLN
INTRAMUSCULAR | Status: AC
  Filled 2015-10-12: qty 2

## 2015-10-12 MED ORDER — CEFAZOLIN SODIUM-DEXTROSE 2-4 GM/100ML-% IV SOLN: 2.0000 g | INTRAVENOUS | Status: DC

## 2015-10-12 MED ORDER — MIDAZOLAM HCL 2 MG/2ML IJ SOLN
INTRAMUSCULAR | Status: AC | PRN
  Administered 2015-10-12: 1 mg via INTRAVENOUS
  Administered 2015-10-12: 0.5 mg via INTRAVENOUS

## 2015-10-12 MED ORDER — CEFAZOLIN SODIUM-DEXTROSE 2-4 GM/100ML-% IV SOLN
INTRAVENOUS | Status: AC
  Filled 2015-10-12: qty 100

## 2015-10-12 MED ORDER — GELATIN ABSORBABLE 12-7 MM EX MISC
CUTANEOUS | Status: AC
  Filled 2015-10-12: qty 1

## 2015-10-12 NOTE — Sedation Documentation (Signed)
Patient is resting comfortably. 

## 2015-10-12 NOTE — Discharge Instructions (Signed)
1.No stooping,bending or lifting more than 10 lbs for 2 weeks. 2.Use walker to ambulate for 2 weeks  3. RTC in 2 weeks prn   KYPHOPLASTY/VERTEBROPLASTY DISCHARGE INSTRUCTIONS  Medications: (check all that apply)     Resume all home medications as before procedure.       Resume your (aspirin/Plavix/Coumadin) on N/A   Continue your pain medications as prescribed as needed.  Over the next 3-5 days, decrease your pain medication as tolerated.  Over the counter medications (i.e. Tylenol, ibuprofen, and aleve) may be substituted once severe/moderate pain symptoms have subsided.   Wound Care: - Bandages may be removed the day following your procedure.  You may get your incision wet once bandages are removed.  Bandaids may be used to cover the incisions until scab formation.  Topical ointments are optional.  - If you develop a fever greater than 101 degrees, have increased skin redness at the incision sites or pus-like oozing from incisions occurring within 1 week of the procedure, contact radiology at [phone] or [phone].  - Ice pack to back for 15-20 minutes 2-3 time per day for first 2-3 days post procedure.  The ice will expedite muscle healing and help with the pain from the incisions.   Activity: - Bedrest today with limited activity for 24 hours post procedure.  - No driving for 48 hours.  - Increase your activity as tolerated after bedrest (with assistance if necessary).  - Refrain from any strenuous activity or heavy lifting (greater than 10 lbs.).   Follow up: - Contact radiology at [phone] or [phone] if any questions/concerns.  - A physician assistant from radiology will contact you in approximately 1 week.  - If a biopsy was performed at the time of your procedure, your referring physician should receive the results in usually 2-3 days.

## 2015-10-12 NOTE — Procedures (Signed)
S/P S1 vertebroplasty bilateral approach

## 2015-10-12 NOTE — H&P (Signed)
Chief Complaint: Patient was seen in consultation today for sacral 1 vertebroplasty/possible additional cement at Lumbar 4 at the request of Dr Virgel Manifold  Referring Physician(s): Dr Virgel Manifold  Supervising Physician: Luanne Bras  Patient Status: Outpatient  History of Present Illness: [name] [name] is a 82 y.o. male   Lumbar 4 vertebroplasty 08/22/15 Did well for few days Noted new back pain with Rt hip and thigh pain within days of procedure No new injury Pain is worsening Was seen in consultation 6/20 Bone scan 6/29: IMPRESSION: Abnormal uptake is seen involving both knees consistent with degenerative joint disease.  Continued presence of abnormal uptake at the L4 level consistent with compression fracture described on prior CT scan.  Also noted is abnormal uptake involving upper thoracic vertebral body which corresponds to compression fracture seen on prior chest Radiograph.  There is noted large area of uptake involving the sacrum on the posterior images, but it is felt this most likely represents uptake arising from the bladder which is full of isotope and the patient reportedly could not void. If there is clinical concern for sacral injury or abnormality, repeat limited bone scan of the pelvic bone after Foley catheter has been placed is recommended.  Now scheduled for Sacral 1 vertebroplasty with possible additional cement for Lumbar 4  Past Medical History  Diagnosis Date  . Cancer ([place])     Stomach  . Collagen vascular disease ([place])   . CHF (congestive heart failure) ([place])   . Hypertension     Past Surgical History  Procedure Laterality Date  . Heart disease      permanent pacemaker  . Appendectomy    . Inguinal hernia repair    . Hip fracture surgery  2011    ORIF  R.  . Pacemaker insertion    . Colonoscopy  12/1998, 02/2004    diverticulosois, external hemorrhoids  . Esophagogastroduodenoscopy  06/17/2014    Dr. Oneida Alar:  1. Patent stricture at the gastroesophageal junction 2. UGIB Due to multiple  gastric ulcers 3. Moderate Duodentitis. Negative H.pylori  . Esophagogastroduodenoscopy N/A 10/17/2014    Procedure: ESOPHAGOGASTRODUODENOSCOPY (EGD);  Surgeon: Danie Binder, MD;  Location: [facility];  Service: Endoscopy;  Laterality: N/A;  1030  . Esophagogastroduodenoscopy N/A 02/10/2015    Procedure: ESOPHAGOGASTRODUODENOSCOPY (EGD);  Surgeon: Danie Binder, MD;  Location: [facility];  Service: Endoscopy;  Laterality: N/A;  115  . Craniotomy Left   . Ep implantable device N/A 06/12/2015    Procedure: Pacemaker Implant ;  Surgeon: Evans Lance, MD;  Location: [facility];  Service: Cardiovascular;  Laterality: N/A;    Allergies: Aleve; Ibuprofen; and Tramadol  Medications: Prior to Admission medications   Medication Sig Start Date End Date Taking? Authorizing Provider  acetaminophen (TYLENOL) 500 MG tablet Take 500 mg by mouth every 6 (six) hours as needed for headache.   Yes Historical Provider, MD  albuterol (PROVENTIL) (2.5 MG/3ML) 0.083% nebulizer solution USE ONE VIAL VIA NEBULIZER FOUR TIMES DAILY AS NEEDED. 07/11/15  Yes Heath Lark, MD  AMBULATORY NON FORMULARY MEDICATION Diltiazem gel 2% Apply small amount pea-sized to anal area 2-3 times a day for anal stenosis. Use as needed. 06/07/15  Yes Evie Lacks Plotnikov, MD  Artificial Tear Ointment (DRY EYES OP) Apply 1 drop to eye daily.   Yes Historical Provider, MD  atorvastatin (LIPITOR) 20 MG tablet TAKE 1/2 TABLET TWICE DAILY Patient taking differently: TAKE 1/2 (10 MG ) TABLET TWICE DAILY  11/10/14  Yes Evie Lacks Plotnikov, MD  Cholecalciferol (VITAMIN D3) 1000 UNITS tablet Take 1,000 Units by mouth daily.     Yes Historical Provider, MD  diphenoxylate-atropine (LOMOTIL) 2.5-0.025 MG tablet Take 1 tablet by mouth 4 (four) times daily as needed for diarrhea or loose stools. 06/07/15  Yes Evie Lacks Plotnikov, MD  donepezil (ARICEPT) 5 MG tablet  Take 1 tablet (5 mg total) by mouth at bedtime. 08/25/15  Yes Evie Lacks Plotnikov, MD  fluticasone (FLONASE) 50 MCG/ACT nasal spray Place 2 sprays into both nostrils daily. 05/26/14  Yes Evie Lacks Plotnikov, MD  folic acid (FOLVITE) Q000111Q MCG tablet Take 400 mcg by mouth daily.    Yes Historical Provider, MD  levothyroxine (SYNTHROID, LEVOTHROID) 112 MCG tablet Take 1 tablet (112 mcg total) by mouth daily. 06/08/15  Yes Evie Lacks Plotnikov, MD  metoprolol succinate (TOPROL-XL) 25 MG 24 hr tablet Take 0.5 tablets (12.5 mg total) by mouth 2 (two) times daily. 12/28/14  Yes Evie Lacks Plotnikov, MD  nitroGLYCERIN (NITROSTAT) 0.4 MG SL tablet Place 1 tablet (0.4 mg total) under the tongue every 5 (five) minutes as needed. Chest pain 07/26/14 07/24/16 Yes Evie Lacks Plotnikov, MD  pantoprazole (PROTONIX) 40 MG tablet Take 1 tablet (40 mg total) by mouth daily. 06/07/15  Yes Cassandria Anger, MD  saccharomyces boulardii (FLORASTOR) 250 MG capsule Take 1 capsule (250 mg total) by mouth 2 (two) times daily. 06/07/15  Yes Evie Lacks Plotnikov, MD  triamcinolone (NASACORT) 55 MCG/ACT nasal inhaler Place 1 spray into the nose daily. 05/01/12  Yes Evie Lacks Plotnikov, MD  triamcinolone ointment (KENALOG) 0.5 % Apply 1 application topically 2 (two) times daily. To the rash on buttocks 08/25/15  Yes Cassandria Anger, MD     Family History  Problem Relation Age of Onset  . Coronary artery disease      male 1st degree relative<60  . Hypertension    . Cancer Sister     Social History   Social History  . Marital Status: Widowed    Spouse Name: N/A  . Number of Children: 2  . Years of Education: N/A   Occupational History  . retired    Social History Main Topics  . Smoking status: Former Smoker -- 1.00 packs/day for 30 years    Quit date: 10/26/1976  . Smokeless tobacco: Never Used  . Alcohol Use: No  . Drug Use: No  . Sexual Activity: Not Currently   Other Topics Concern  . None   Social History Narrative    Regular exercise--no.     Review of Systems: A 12 point ROS discussed and pertinent positives are indicated in the HPI above.  All other systems are negative.  Review of Systems  Constitutional: Negative for fever, activity change, appetite change and fatigue.  Respiratory: Negative for shortness of breath.   Gastrointestinal: Negative for abdominal pain.  Musculoskeletal: Positive for back pain and gait problem.  Neurological: Positive for weakness.  Psychiatric/Behavioral: Negative for behavioral problems and confusion.    Vital Signs: BP 128/105 mmHg  Pulse 71  Temp(Src) 98.1 F (36.7 C) (Oral)  Resp 16  Ht 5\' 8"  (1.727 m)  Wt 150 lb (68.04 kg)  BMI 22.81 kg/m2  SpO2 99%  Physical Exam  Constitutional: He is oriented to person, place, and time.  Cardiovascular: Normal rate and regular rhythm.   Pulmonary/Chest: Effort normal.  Abdominal: Soft. Bowel sounds are normal.  Musculoskeletal: Normal range of motion. He exhibits tenderness.  Low  back pain  Neurological: He is alert and oriented to person, place, and time.  Skin: Skin is warm and dry.  Psychiatric: He has a normal mood and affect. His behavior is normal. Judgment and thought content normal.  Nursing note and vitals reviewed.   Mallampati Score:  MD Evaluation Airway: WNL Heart: WNL Abdomen: WNL Chest/ Lungs: WNL ASA  Classification: 3 Mallampati/Airway Score: One  Imaging: Nm Bone Scan Whole Body  10/03/2015  ADDENDUM REPORT: 10/03/2015 16:20 ADDENDUM: There is noted large area of uptake involving the sacrum on the posterior images, but it is felt this most likely represents uptake arising from the bladder which is full of isotope and the patient reportedly could not void. If there is clinical concern for sacral injury or abnormality, repeat limited bone scan of the pelvic bone after Foley catheter has been placed is recommended. Electronically Signed   By: Marijo Conception, M.D.   On: 10/03/2015  16:20  10/03/2015  CLINICAL DATA:  Low back pain. EXAM: NUCLEAR MEDICINE WHOLE BODY BONE SCAN TECHNIQUE: Whole body anterior and posterior images were obtained approximately 3 hours after intravenous injection of radiopharmaceutical. RADIOPHARMACEUTICALS:  24.7 mCi Technetium-87m MDP IV COMPARISON:  Nuclear medicine study of Jul 27, 2015. CT scan of Aug 22, 2015. Radiographs of Jul 30, 2015 and March 29, 2015. FINDINGS: Abnormal uptake is again noted at the L4 level corresponding to compression fracture at this level noted on prior CT scan. Also noted is linear abnormal uptake involving an upper thoracic vertebral body concerning for compression fracture. Abnormal uptake is noted in both knee joints consistent with degenerative joint disease. Status post right hip arthroplasty. IMPRESSION: Abnormal uptake is seen involving both knees consistent with degenerative joint disease. Continued presence of abnormal uptake at the L4 level consistent with compression fracture described on prior CT scan. Also noted is abnormal uptake involving upper thoracic vertebral body which corresponds to compression fracture seen on prior chest radiograph. Electronically Signed: By: Marijo Conception, M.D. On: 09/21/2015 15:17   Ir Radiologist Eval & Mgmt  09/13/2015  EXAM: ESTABLISHED PATIENT OFFICE VISIT CHIEF COMPLAINT: Low back pain, left hip pain, left thigh pain. Current Pain Level: 1-10 HISTORY OF PRESENT ILLNESS: The patient is a 83 year old, gentleman who is status post vertebral body augmentation for painful compression fracture at L4 on 08/08/2015. The patient returns today in follow-up. He is accompanied by his daughter and son-in-law. The patient is in assisted living. According to the patient and his daughter, following the procedure, there was appreciable relief in his pain for a few days. Thereafter, the patient reports worsening of pain in his low back region more troublesome in the left hip and the left thigh region.  The patient is a poor historian but is quite adamant that the pain in his low back region and left hip pain has worsened. The patient reportedly takes two tablets of Tylenol with appreciable relief of his pain. He has not been weight-bearing or ambulatory. He has been in a wheelchair even prior to the procedure. Exact reason for this remains unclear. On close questioning, he denies any recent chills or fever or rigors. He denies any radicular pain shooting into his lower extremities. He also denies any autonomic dysfunction of his bowel or bladder. Denies any dysuria, hematuria or frequency of micturition. His weight is steady according to him. His appetite is variable which is normal for him. His past medical history, previous surgeries, social history and family history remain unchanged and are  as per the history and physical at the time of his vertebral body augmentation on May 16th. PHYSICAL EXAMINATION: Problem focused, system of complaint Medications: Acetaminophen Albuterol nebulizer. Atorvastatin. Cholecalciferol vitamin-D3 tablets. Lomotil. Aricept. Flonase. Folic Acid. Levothyroxine. Metoprolol. Nitrostat as needed. Protonix. Florastor capsules. Nasocort nasal inhaler. Allergies:  Aleve, ibuprofen and tramadol. In mild distress on brief examination on account of his pain. Reportedly the patient cannot be still for prolonged periods of time on a hard surface. Neurologically briefly alert, oriented to time, place, space. No obvious lateralizing cranial nerve, motor or sensory features. Patient in a wheelchair and was not stood up. Demonstrates moderate tenderness in the lumbosacral regions on deep palpation. ASSESSMENT AND PLAN: The patient and family were informed that given the above history and clinical examination, the options were to continue monitoring for a couple more weeks versus consideration of workup to evaluate for new fracture. The family has chosen the latter. Therefore patient was scheduled  for a bone scan to evaluate for a new fracture in the area mentioned. Further recommendations depending on the results of the bone scan. Questions were answered to their satisfaction. Both the patient and family understand plan of action. Electronically Signed   By: Luanne Bras M.D.   On: 09/12/2015 14:51    Labs:  CBC:  Recent Labs  06/01/15 0755 06/07/15 1545 08/22/15 0720 10/12/15 0830  WBC 9.7 12.2* 8.0 8.7  HGB 11.5* 11.8* 11.7* 12.0*  HCT 33.7* 35.5* 34.7* 36.2*  PLT 204 242.0 233 170    COAGS:  Recent Labs  08/22/15 0720 10/12/15 0830  INR 1.17 1.15  APTT 41* 39*    BMP:  Recent Labs  05/22/15 0630 06/01/15 0755 06/07/15 1545 08/22/15 0720 10/12/15 0830  NA 136 132* 131* 128* 130*  K 4.0 4.1 4.3 3.6 3.9  CL 104 100* 100 94* 99*  CO2 26 25 25 24 24   GLUCOSE 101* 99 104* 89 97  BUN 32* 28* 35* 21* 38*  CALCIUM 7.6* 8.2* 8.7 8.9 9.1  CREATININE 1.61* 1.52* 1.57* 1.25* 1.65*  GFRNONAA 36* 39*  --  49* 35*  GFRAA 42* 45*  --  57* 40*    LIVER FUNCTION TESTS:  Recent Labs  12/30/14 1033 01/27/15 1007 03/12/15 0441 04/06/15 0730 05/12/15 1255  BILITOT 0.97 0.84  --  0.6 0.57  AST 23 22  --  21 16  ALT 14 16  --  16* 12  ALKPHOS 76 78  --  84 73  PROT 6.5 6.7  --  6.3* 6.4  ALBUMIN 3.7 3.7 3.8 3.0* 3.3*    TUMOR MARKERS:  Recent Labs  11/01/14 0514  AFPTM 1.7  CA199 15    Assessment and Plan:  Sacral fx per Bone scan Worsening back pain for weeks No new injury Scheduled today for S1 vertebroplasty and possible additional cement for Lumbar 4 Risks and Benefits discussed with the patient including, but not limited to education regarding the natural healing process of compression fractures without intervention, bleeding, infection, cement migration which may cause spinal cord damage, paralysis, pulmonary embolism or even death. All of the patient's questions were answered, patient is agreeable to proceed. Consent signed and in  chart.  Thank you for this interesting consult.  I greatly enjoyed meeting [name] [name] and look forward to participating in their care.  A copy of this report was sent to the requesting provider on this date.  Electronically Signed: Monia Sabal A 10/12/2015, 9:49 AM   I spent a  total of  30 Minutes   in face to face in clinical consultation, greater than 50% of which was counseling/coordinating care for S1 VP and poss additional cement for L4

## 2015-10-12 NOTE — Sedation Documentation (Signed)
Patient denies pain and is resting comfortably.  

## 2015-10-12 NOTE — Telephone Encounter (Signed)
I pended the order in chart. Please complete

## 2015-10-12 NOTE — Sedation Documentation (Addendum)
Pt. Using suction catheter. Unable to swallow well while prone. CO2  measurment unreliable at times.

## 2015-10-19 ENCOUNTER — Other Ambulatory Visit: Payer: Self-pay | Admitting: "Endocrinology

## 2015-10-23 ENCOUNTER — Telehealth: Payer: Self-pay | Admitting: Emergency Medicine

## 2015-10-23 ENCOUNTER — Encounter: Payer: Self-pay | Admitting: Internal Medicine

## 2015-10-23 ENCOUNTER — Ambulatory Visit (INDEPENDENT_AMBULATORY_CARE_PROVIDER_SITE_OTHER): Payer: Medicare Other | Admitting: Internal Medicine

## 2015-10-23 ENCOUNTER — Other Ambulatory Visit (INDEPENDENT_AMBULATORY_CARE_PROVIDER_SITE_OTHER): Payer: Medicare Other

## 2015-10-23 DIAGNOSIS — M5442 Lumbago with sciatica, left side: Secondary | ICD-10-CM | POA: Diagnosis not present

## 2015-10-23 DIAGNOSIS — M5441 Lumbago with sciatica, right side: Secondary | ICD-10-CM

## 2015-10-23 DIAGNOSIS — I10 Essential (primary) hypertension: Secondary | ICD-10-CM | POA: Diagnosis not present

## 2015-10-23 DIAGNOSIS — R413 Other amnesia: Secondary | ICD-10-CM

## 2015-10-23 DIAGNOSIS — I255 Ischemic cardiomyopathy: Secondary | ICD-10-CM

## 2015-10-23 DIAGNOSIS — R197 Diarrhea, unspecified: Secondary | ICD-10-CM | POA: Diagnosis not present

## 2015-10-23 DIAGNOSIS — K624 Stenosis of anus and rectum: Secondary | ICD-10-CM

## 2015-10-23 LAB — BASIC METABOLIC PANEL
BUN: 36 mg/dL — AB (ref 6–23)
CALCIUM: 8.9 mg/dL (ref 8.4–10.5)
CO2: 24 mEq/L (ref 19–32)
CREATININE: 1.68 mg/dL — AB (ref 0.40–1.50)
Chloride: 101 mEq/L (ref 96–112)
GFR: 40.9 mL/min — AB (ref >60.00)
GLUCOSE: 94 mg/dL (ref 70–99)
POTASSIUM: 4.3 meq/L (ref 3.5–5.1)
Sodium: 133 mEq/L — ABNORMAL LOW (ref 135–145)

## 2015-10-23 MED ORDER — ALBUTEROL SULFATE (2.5 MG/3ML) 0.083% IN NEBU: INHALATION_SOLUTION | RESPIRATORY_TRACT | 11 refills | Status: DC

## 2015-10-23 MED ORDER — LEVOTHYROXINE SODIUM 112 MCG PO TABS: 112.0000 ug | ORAL_TABLET | Freq: Every day | ORAL | 11 refills | Status: DC

## 2015-10-23 MED ORDER — DILTIAZEM GEL 2 %: 1.0000 "application " | Freq: Three times a day (TID) | CUTANEOUS | 11 refills | Status: DC

## 2015-10-23 NOTE — Assessment & Plan Note (Signed)
Resolved OK to restart milk Cont w/Aricept 5 mg/d

## 2015-10-23 NOTE — Assessment & Plan Note (Signed)
On Metoprolol 

## 2015-10-23 NOTE — Progress Notes (Signed)
Subjective:  Patient ID: [name] [name], male    DOB: [birth year]  Age: 82 y.o. MRN: [id number]  CC: No chief complaint on file.   HPI [name] [name] presents for severe LBP - compression fx was treated 1 wk ago - some better  F/u memory loss, HTN  Outpatient Medications Prior to Visit  Medication Sig Dispense Refill  . acetaminophen (TYLENOL) 500 MG tablet Take 500 mg by mouth every 6 (six) hours as needed for headache.    . albuterol (PROVENTIL) (2.5 MG/3ML) 0.083% nebulizer solution USE ONE VIAL VIA NEBULIZER FOUR TIMES DAILY AS NEEDED. 375 mL 0  . AMBULATORY NON FORMULARY MEDICATION Diltiazem gel 2% Apply small amount pea-sized to anal area 2-3 times a day for anal stenosis. Use as needed. 30 g 5  . Artificial Tear Ointment (DRY EYES OP) Apply 1 drop to eye daily.    Marland Kitchen atorvastatin (LIPITOR) 20 MG tablet TAKE 1/2 TABLET TWICE DAILY (Patient taking differently: TAKE 1/2 (10 MG ) TABLET TWICE DAILY) 90 tablet 2  . Cholecalciferol (VITAMIN D3) 1000 UNITS tablet Take 1,000 Units by mouth daily.      . diphenoxylate-atropine (LOMOTIL) 2.5-0.025 MG tablet Take 1 tablet by mouth 4 (four) times daily as needed for diarrhea or loose stools. 60 tablet 1  . donepezil (ARICEPT) 5 MG tablet Take 1 tablet (5 mg total) by mouth at bedtime. 90 tablet 3  . fluticasone (FLONASE) 50 MCG/ACT nasal spray Place 2 sprays into both nostrils daily. 48 g 3  . folic acid (FOLVITE) Q000111Q MCG tablet Take 400 mcg by mouth daily.     Marland Kitchen levothyroxine (SYNTHROID, LEVOTHROID) 112 MCG tablet Take 1 tablet (112 mcg total) by mouth daily. 30 tablet 11  . metoprolol succinate (TOPROL-XL) 25 MG 24 hr tablet Take 0.5 tablets (12.5 mg total) by mouth 2 (two) times daily. 90 tablet 3  . nitroGLYCERIN (NITROSTAT) 0.4 MG SL tablet Place 1 tablet (0.4 mg total) under the tongue every 5 (five) minutes as needed. Chest pain 20 tablet 2  . pantoprazole (PROTONIX) 40 MG tablet Take 1 tablet (40 mg total) by mouth daily. 90 tablet 3    . saccharomyces boulardii (FLORASTOR) 250 MG capsule Take 1 capsule (250 mg total) by mouth 2 (two) times daily. 60 capsule 2  . triamcinolone (NASACORT) 55 MCG/ACT nasal inhaler Place 1 spray into the nose daily. 3 Inhaler 1  . triamcinolone ointment (KENALOG) 0.5 % Apply 1 application topically 2 (two) times daily. To the rash on buttocks 45 g 3   No facility-administered medications prior to visit.     ROS Review of Systems  Constitutional: Negative for appetite change, fatigue and unexpected weight change.  HENT: Negative for congestion, nosebleeds, sneezing, sore throat and trouble swallowing.   Eyes: Negative for itching and visual disturbance.  Respiratory: Negative for cough.   Cardiovascular: Negative for chest pain, palpitations and leg swelling.  Gastrointestinal: Negative for abdominal distention, blood in stool, diarrhea and nausea.  Genitourinary: Negative for frequency and hematuria.  Musculoskeletal: Positive for back pain and gait problem. Negative for joint swelling and neck pain.  Skin: Negative for rash.  Neurological: Negative for dizziness, tremors, speech difficulty and weakness.  Psychiatric/Behavioral: Negative for agitation, dysphoric mood and sleep disturbance. The patient is not nervous/anxious.     Objective:  BP 138/78   Pulse 69   Wt 151 lb (68.5 kg)   SpO2 96%   BMI 22.96 kg/m   BP Readings from Last 3  Encounters:  10/23/15 138/78  10/12/15 (!) 148/69  09/21/15 194/76    Wt Readings from Last 3 Encounters:  10/23/15 151 lb (68.5 kg)  10/12/15 150 lb (68 kg)  09/18/15 150 lb (68 kg)    Physical Exam  Constitutional: He is oriented to person, place, and time. He appears well-developed. No distress.  NAD  HENT:  Mouth/Throat: Oropharynx is clear and moist.  Eyes: Conjunctivae are normal. Pupils are equal, round, and reactive to light.  Neck: Normal range of motion. No JVD present. No thyromegaly present.  Cardiovascular: Normal rate,  regular rhythm, normal heart sounds and intact distal pulses.  Exam reveals no gallop and no friction rub.   No murmur heard. Pulmonary/Chest: Effort normal and breath sounds normal. No respiratory distress. He has no wheezes. He has no rales. He exhibits no tenderness.  Abdominal: Soft. Bowel sounds are normal. He exhibits no distension and no mass. There is no tenderness. There is no rebound and no guarding.  Musculoskeletal: Normal range of motion. He exhibits tenderness. He exhibits no edema.  Lymphadenopathy:    He has no cervical adenopathy.  Neurological: He is alert and oriented to person, place, and time. He has normal reflexes. No cranial nerve deficit. He exhibits normal muscle tone. He displays a negative Romberg sign. Coordination abnormal. Gait normal.  Skin: Skin is warm and dry. No rash noted.  Psychiatric: He has a normal mood and affect. His behavior is normal. Judgment and thought content normal.    Lab Results  Component Value Date   WBC 8.7 10/12/2015   HGB 12.0 (L) 10/12/2015   HCT 36.2 (L) 10/12/2015   PLT 170 10/12/2015   GLUCOSE 97 10/12/2015   CHOL 110 05/25/2013   TRIG 67.0 05/25/2013   HDL 40.30 05/25/2013   LDLCALC 56 05/25/2013   ALT 12 05/12/2015   AST 16 05/12/2015   NA 130 (L) 10/12/2015   K 3.9 10/12/2015   CL 99 (L) 10/12/2015   CREATININE 1.65 (H) 10/12/2015   BUN 38 (H) 10/12/2015   CO2 24 10/12/2015   TSH 6.20 (H) 06/07/2015   PSA 3.42 11/28/2011   INR 1.15 10/12/2015    Ir Vertebroplasty Lumbar Bx Inc Uni/bil Inc Inject/imaging  Result Date: 10/13/2015 INDICATION: Severe low back and sacral pain due to sacral insufficiency fractures. EXAM: IR VERTEBROPLASTY LUMBOSACRAL INJ at S1 MEDICATIONS: As antibiotic prophylaxis, Ancef 2 g IV was ordered pre-procedure and administered intravenously within 1 hour of incision. ANESTHESIA/SEDATION: Moderate (conscious) sedation was employed during this procedure. A total of Versed 1.5 mg and Fentanyl  137.5 mcg was administered intravenously. Moderate Sedation Time: 25 minutes. The patient's level of consciousness and vital signs were monitored continuously by radiology nursing throughout the procedure under my direct supervision. FLUOROSCOPY TIME:  Fluoroscopy Time: 10 minutes 6 seconds (123XX123 mGy) COMPLICATIONS: None immediate. TECHNIQUE: Informed written consent was obtained from the patient after a thorough discussion of the procedural risks, benefits and alternatives. All questions were addressed. Maximal Sterile Barrier Technique was utilized including caps, mask, sterile gowns, sterile gloves, sterile drape, hand hygiene and skin antiseptic. A timeout was performed prior to the initiation of the procedure. PROCEDURE: The patient was placed prone on the fluoroscopic table. Nasal oxygen was administered. Physiologic monitoring was performed throughout the duration of the procedure. The skin overlying skin entry sites was prepped and draped in the usual sterile fashion. The S1 vertebral body was identified bilateral approach. Soft tissues were infiltrated with 0.25% Bupivacaine. This was then followed  by the advancement of a 13-gauge Cook needle into the anterior one-third. A gentle contrast injection demonstrated a trabecular pattern of contrast, with early opacification of anterior pelvic veins necessitating the use of Gel-Foam pledgets and the 13 gauge Cook spinal needle on the left-sided approach. At this time, methylmethacrylate mixture was reconstituted. Under biplane intermittent fluoroscopy, the methylmethacrylate was then injected into the S1 vertebral body with filling of the vertebral body. No extravasation was noted into the disk spaces or posteriorly into the spinal canal. No epidural venous contamination was seen. The needles were then removed. Hemostasis was achieved at the skin entry sites. There were no acute complications. Patient tolerated the procedure well. The patient was observed for 3  hours and discharged in good condition. IMPRESSION: Status post vertebral body augmentation for painful sacral insufficiency fractures at S1 using vertebroplasty technique. Electronically Signed   By: Luanne Bras M.D.   On: 10/12/2015 14:02    Assessment & Plan:   There are no diagnoses linked to this encounter. I am having Mr. Goos maintain his folic acid, cholecalciferol, triamcinolone, fluticasone, nitroGLYCERIN, atorvastatin, metoprolol succinate, acetaminophen, Artificial Tear Ointment (DRY EYES OP), AMBULATORY NON FORMULARY MEDICATION, pantoprazole, saccharomyces boulardii, diphenoxylate-atropine, levothyroxine, albuterol, donepezil, and triamcinolone ointment.  No orders of the defined types were placed in this encounter.    Follow-up: No Follow-up on file.  Walker Kehr, MD

## 2015-10-23 NOTE — Addendum Note (Signed)
Addended by: Cassandria Anger on: 10/23/2015 02:49 PM   Modules accepted: Orders

## 2015-10-23 NOTE — Assessment & Plan Note (Signed)
Cont w/Aricept 5 mg/d

## 2015-10-23 NOTE — Assessment & Plan Note (Addendum)
S/p S1 vertebroplasty Tylenol prn

## 2015-10-23 NOTE — Telephone Encounter (Signed)
LBP and compression fx Thx

## 2015-10-23 NOTE — Progress Notes (Signed)
Pre visit review using our clinic review tool, if applicable. No additional management support is needed unless otherwise documented below in the visit note. 

## 2015-10-23 NOTE — Telephone Encounter (Signed)
[facility] needs clarification for the reason why the wheelchair is needed. Please follow up thanks.

## 2015-10-24 NOTE — Telephone Encounter (Signed)
Sharee Pimple informed of additional diagnoses and ICD10 codes.

## 2015-10-26 ENCOUNTER — Ambulatory Visit (HOSPITAL_COMMUNITY)
Admission: RE | Admit: 2015-10-26 | Discharge: 2015-10-26 | Disposition: A | Discharge status: Home / Self Care | Payer: Medicare Other | Source: Ambulatory Visit | Attending: Interventional Radiology | Admitting: Interventional Radiology

## 2015-10-26 ENCOUNTER — Telehealth: Payer: Self-pay | Admitting: *Deleted

## 2015-10-26 DIAGNOSIS — IMO0002 Reserved for concepts with insufficient information to code with codable children: Secondary | ICD-10-CM

## 2015-10-26 DIAGNOSIS — M25552 Pain in left hip: Secondary | ICD-10-CM | POA: Diagnosis not present

## 2015-10-26 HISTORY — PX: IR GENERIC HISTORICAL: IMG1180011

## 2015-10-26 MED ORDER — DONEPEZIL HCL 5 MG PO TABS: 5.0000 mg | ORAL_TABLET | Freq: Every day | ORAL | 3 refills | Status: DC

## 2015-10-26 MED ORDER — SACCHAROMYCES BOULARDII 250 MG PO CAPS: 250.0000 mg | ORAL_CAPSULE | Freq: Two times a day (BID) | ORAL | 3 refills | Status: DC

## 2015-10-26 NOTE — Telephone Encounter (Signed)
rec'd call pt is needing 90 day supply sent to [employer] on his aricept Chief Executive Officer. Sent both electronically...[name] [name]

## 2015-10-27 ENCOUNTER — Encounter (HOSPITAL_COMMUNITY): Payer: Self-pay | Admitting: Interventional Radiology

## 2015-10-30 ENCOUNTER — Other Ambulatory Visit (HOSPITAL_COMMUNITY): Payer: Self-pay | Admitting: Orthopedic Surgery

## 2015-10-30 DIAGNOSIS — M898X5 Other specified disorders of bone, thigh: Secondary | ICD-10-CM

## 2015-10-31 ENCOUNTER — Telehealth: Payer: Self-pay

## 2015-10-31 NOTE — Telephone Encounter (Signed)
Patient daughter called and said he is having a scan done. And the hospital wanted to know if there someone who could put a cathter in to drain his bladder. Can we advise on this. If you would please call his daughter back Ivin Booty on [phone]

## 2015-11-01 NOTE — Telephone Encounter (Signed)
Sorry, we can't do it in the office. They should contact Urology Thx

## 2015-11-02 NOTE — Telephone Encounter (Signed)
P/t's daughter informed

## 2015-11-03 ENCOUNTER — Encounter (HOSPITAL_COMMUNITY)
Admission: RE | Admit: 2015-11-03 | Discharge: 2015-11-03 | Disposition: A | Discharge status: Home / Self Care | Payer: Medicare Other | Source: Ambulatory Visit | Attending: Orthopedic Surgery | Admitting: Orthopedic Surgery

## 2015-11-03 DIAGNOSIS — M898X5 Other specified disorders of bone, thigh: Secondary | ICD-10-CM

## 2015-11-03 DIAGNOSIS — M899 Disorder of bone, unspecified: Secondary | ICD-10-CM | POA: Diagnosis not present

## 2015-11-03 DIAGNOSIS — M25552 Pain in left hip: Secondary | ICD-10-CM | POA: Diagnosis not present

## 2015-11-03 MED ORDER — TECHNETIUM TC 99M MEDRONATE IV KIT
25.0000 | PACK | Freq: Once | INTRAVENOUS | Status: AC | PRN
  Administered 2015-11-03: 25 via INTRAVENOUS

## 2015-11-06 DIAGNOSIS — M25552 Pain in left hip: Secondary | ICD-10-CM | POA: Diagnosis not present

## 2015-11-06 DIAGNOSIS — M898X5 Other specified disorders of bone, thigh: Secondary | ICD-10-CM | POA: Diagnosis not present

## 2015-11-06 DIAGNOSIS — S32040D Wedge compression fracture of fourth lumbar vertebra, subsequent encounter for fracture with routine healing: Secondary | ICD-10-CM | POA: Diagnosis not present

## 2015-11-13 ENCOUNTER — Other Ambulatory Visit: Payer: Medicare Other

## 2015-11-13 ENCOUNTER — Ambulatory Visit: Payer: Medicare Other | Admitting: Hematology and Oncology

## 2015-11-17 DIAGNOSIS — M5136 Other intervertebral disc degeneration, lumbar region: Secondary | ICD-10-CM | POA: Diagnosis not present

## 2015-11-17 DIAGNOSIS — M545 Low back pain: Secondary | ICD-10-CM | POA: Diagnosis not present

## 2015-11-17 DIAGNOSIS — M5137 Other intervertebral disc degeneration, lumbosacral region: Secondary | ICD-10-CM | POA: Diagnosis not present

## 2015-12-18 ENCOUNTER — Other Ambulatory Visit (HOSPITAL_BASED_OUTPATIENT_CLINIC_OR_DEPARTMENT_OTHER): Payer: Medicare Other

## 2015-12-18 ENCOUNTER — Ambulatory Visit (INDEPENDENT_AMBULATORY_CARE_PROVIDER_SITE_OTHER): Payer: Medicare Other | Admitting: *Deleted

## 2015-12-18 ENCOUNTER — Telehealth: Payer: Self-pay | Admitting: Hematology and Oncology

## 2015-12-18 ENCOUNTER — Telehealth: Payer: Self-pay

## 2015-12-18 ENCOUNTER — Telehealth: Payer: Self-pay | Admitting: Cardiology

## 2015-12-18 ENCOUNTER — Encounter: Payer: Self-pay | Admitting: Hematology and Oncology

## 2015-12-18 ENCOUNTER — Ambulatory Visit (HOSPITAL_BASED_OUTPATIENT_CLINIC_OR_DEPARTMENT_OTHER): Payer: Medicare Other | Admitting: Hematology and Oncology

## 2015-12-18 DIAGNOSIS — N189 Chronic kidney disease, unspecified: Secondary | ICD-10-CM

## 2015-12-18 DIAGNOSIS — I495 Sick sinus syndrome: Secondary | ICD-10-CM | POA: Diagnosis not present

## 2015-12-18 DIAGNOSIS — Z8572 Personal history of non-Hodgkin lymphomas: Secondary | ICD-10-CM | POA: Diagnosis not present

## 2015-12-18 DIAGNOSIS — D631 Anemia in chronic kidney disease: Secondary | ICD-10-CM

## 2015-12-18 DIAGNOSIS — Z299 Encounter for prophylactic measures, unspecified: Secondary | ICD-10-CM | POA: Insufficient documentation

## 2015-12-18 DIAGNOSIS — R748 Abnormal levels of other serum enzymes: Secondary | ICD-10-CM

## 2015-12-18 DIAGNOSIS — R740 Nonspecific elevation of levels of transaminase and lactic acid dehydrogenase [LDH]: Secondary | ICD-10-CM | POA: Diagnosis not present

## 2015-12-18 LAB — COMPREHENSIVE METABOLIC PANEL
ALT: 53 U/L (ref 0–55)
AST: 40 U/L — AB (ref 5–34)
Albumin: 3.6 g/dL (ref 3.5–5.0)
Alkaline Phosphatase: 256 U/L — ABNORMAL HIGH (ref 40–150)
Anion Gap: 10 mEq/L (ref 3–11)
BUN: 33.7 mg/dL — AB (ref 7.0–26.0)
CHLORIDE: 103 meq/L (ref 98–109)
CO2: 22 mEq/L (ref 22–29)
CREATININE: 1.5 mg/dL — AB (ref 0.7–1.3)
Calcium: 9.2 mg/dL (ref 8.4–10.4)
EGFR: 41 mL/min/{1.73_m2} — ABNORMAL LOW (ref >90)
GLUCOSE: 89 mg/dL (ref 70–140)
POTASSIUM: 4.2 meq/L (ref 3.5–5.1)
SODIUM: 136 meq/L (ref 136–145)
Total Bilirubin: 0.68 mg/dL (ref 0.20–1.20)
Total Protein: 6.9 g/dL (ref 6.4–8.3)

## 2015-12-18 LAB — CBC WITH DIFFERENTIAL/PLATELET
BASO%: 0.9 % (ref 0.0–2.0)
BASOS ABS: 0.1 10*3/uL (ref 0.0–0.1)
EOS%: 6.3 % (ref 0.0–7.0)
Eosinophils Absolute: 0.5 10*3/uL (ref 0.0–0.5)
HEMATOCRIT: 38.3 % — AB (ref 38.4–49.9)
HEMOGLOBIN: 12.7 g/dL — AB (ref 13.0–17.1)
LYMPH#: 1.3 10*3/uL (ref 0.9–3.3)
LYMPH%: 15 % (ref 14.0–49.0)
MCH: 31 pg (ref 27.2–33.4)
MCHC: 33.1 g/dL (ref 32.0–36.0)
MCV: 93.9 fL (ref 79.3–98.0)
MONO#: 1.3 10*3/uL — ABNORMAL HIGH (ref 0.1–0.9)
MONO%: 15.2 % — AB (ref 0.0–14.0)
NEUT#: 5.2 10*3/uL (ref 1.5–6.5)
NEUT%: 62.6 % (ref 39.0–75.0)
Platelets: 152 10*3/uL (ref 140–400)
RBC: 4.08 10*6/uL — ABNORMAL LOW (ref 4.20–5.82)
RDW: 14.5 % (ref 11.0–14.6)
WBC: 8.4 10*3/uL (ref 4.0–10.3)

## 2015-12-18 NOTE — Assessment & Plan Note (Signed)
This is likely anemia of chronic disease. The patient denies recent history of bleeding such as epistaxis, hematuria or hematochezia. He is asymptomatic from the anemia. We will observe for now.  

## 2015-12-18 NOTE — Telephone Encounter (Signed)
03.26.2018 Appointments scheduled per 09/25 LOS. AVS and future schedule given to patient.

## 2015-12-18 NOTE — Assessment & Plan Note (Signed)
This is likely related to increased turnover from recent hip fracture. Observe only.

## 2015-12-18 NOTE — Assessment & Plan Note (Signed)
We discussed the importance of preventive care including high-dose vitamin D supplement in view of diagnosis of lymphoma and recent hip fracture. The patient should also get influenza vaccination but he declines today. He would like to get his vaccination through the assisted living program

## 2015-12-18 NOTE — Progress Notes (Signed)
[facility] OFFICE PROGRESS NOTE  Patient Care Team: Cassandria Anger, MD as PCP - General Gatha Mayer, MD (Gastroenterology) Evans Lance, MD (Cardiology) Judeth Horn, MD (General Surgery) Danie Binder, MD as Consulting Physician (Gastroenterology) Heath Lark, MD as Consulting Physician (Hematology and Oncology)  SUMMARY OF ONCOLOGIC HISTORY:   History of B-cell lymphoma   04/30/2014 Imaging    Pan CT scan after an accident showed no evidence of disease      06/17/2014 Pathology Results    Accession: [id number] biopsy showed no evidence of cancer H Pylori      06/17/2014 Procedure    EGD showed diffuse gastritis      10/17/2014 Procedure    repeat EGD showed persistent stomach ulcer      10/17/2014 Pathology Results    Accession: [id number] 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      10/26/2014 - 11/02/2014 Hospital Admission    the patient was admitted for fevers and chills and was subsequently found to have bacteremia, resolved with IV anti-biotic therapy      10/26/2014 Imaging    CT scan of the chest show new pulmonary nodules of unknown etiology      10/31/2014 Imaging    CT scan of the abdomen show persistent liver cirrhosis but no regional lymphadenopathy      11/07/2014 Imaging    PET CT scan showed no other evidence of cancer involvement      11/09/2014 - 01/27/2015 Chemotherapy    he received weekly Rituxan x 4 then 2 more doses      02/07/2015 Imaging    PEt scan showed no evidence of disease      02/10/2015 Procedure    Repeat EGD showed no residual lymphoma. MILD Non-erosive gastritis       INTERVAL HISTORY: Please see below for problem oriented charting. He returns today for follow-up. The patient is currently residing in an assisted living facility. There were no reported recent falls. He is recovering well from recent hip fracture. He denies recent chest pain or shortness breath. He was started on  Aricept for memory decline. Family members did not notice any progressive decline in his memory or recent confusion  REVIEW OF SYSTEMS:   Constitutional: Denies fevers, chills or abnormal weight loss Eyes: Denies blurriness of vision Ears, nose, mouth, throat, and face: Denies mucositis or sore throat Respiratory: Denies cough, dyspnea or wheezes Cardiovascular: Denies palpitation, chest discomfort or lower extremity swelling Gastrointestinal:  Denies nausea, heartburn or change in bowel habits Skin: Denies abnormal skin rashes Lymphatics: Denies new lymphadenopathy or easy bruising Neurological:Denies numbness, tingling or new weaknesses Behavioral/Psych: Mood is stable, no new changes  All other systems were reviewed with the patient and are negative.  I have reviewed the past medical history, past surgical history, social history and family history with the patient and they are unchanged from previous note.  ALLERGIES:  is allergic to aleve [naproxen sodium]; ibuprofen; and tramadol.  MEDICATIONS:  Current Outpatient Prescriptions  Medication Sig Dispense Refill  . acetaminophen (TYLENOL) 500 MG tablet Take 500 mg by mouth every 6 (six) hours as needed for headache.    . albuterol (PROVENTIL) (2.5 MG/3ML) 0.083% nebulizer solution USE ONE VIAL VIA NEBULIZER FOUR TIMES DAILY AS NEEDED. 375 mL 11  . AMBULATORY NON FORMULARY MEDICATION Diltiazem gel 2% Apply small amount pea-sized to anal area 2-3 times a day for anal stenosis. Use as needed. 30 g 5  . Artificial  Tear Ointment (DRY EYES OP) Apply 1 drop to eye daily.    Marland Kitchen atorvastatin (LIPITOR) 20 MG tablet TAKE 1/2 TABLET TWICE DAILY (Patient taking differently: TAKE 1/2 (10 MG ) TABLET TWICE DAILY) 90 tablet 2  . diltiazem 2 % GEL Apply 1 application topically 3 (three) times daily. 30 g 11  . diphenoxylate-atropine (LOMOTIL) 2.5-0.025 MG tablet Take 1 tablet by mouth 4 (four) times daily as needed for diarrhea or loose stools. 60  tablet 1  . donepezil (ARICEPT) 5 MG tablet Take 1 tablet (5 mg total) by mouth at bedtime. 90 tablet 3  . fluticasone (FLONASE) 50 MCG/ACT nasal spray Place 2 sprays into both nostrils daily. 48 g 3  . folic acid (FOLVITE) Q000111Q MCG tablet Take 400 mcg by mouth daily.     Marland Kitchen levothyroxine (SYNTHROID, LEVOTHROID) 112 MCG tablet Take 1 tablet (112 mcg total) by mouth daily. 30 tablet 11  . metoprolol succinate (TOPROL-XL) 25 MG 24 hr tablet Take 0.5 tablets (12.5 mg total) by mouth 2 (two) times daily. 90 tablet 3  . multivitamin-iron-minerals-folic acid (CENTRUM) chewable tablet Chew 1 tablet by mouth daily.    . nitroGLYCERIN (NITROSTAT) 0.4 MG SL tablet Place 1 tablet (0.4 mg total) under the tongue every 5 (five) minutes as needed. Chest pain 20 tablet 2  . pantoprazole (PROTONIX) 40 MG tablet Take 1 tablet (40 mg total) by mouth daily. 90 tablet 3  . saccharomyces boulardii (FLORASTOR) 250 MG capsule Take 1 capsule (250 mg total) by mouth 2 (two) times daily. 180 capsule 3  . triamcinolone (NASACORT) 55 MCG/ACT nasal inhaler Place 1 spray into the nose daily. 3 Inhaler 1   No current facility-administered medications for this visit.     PHYSICAL EXAMINATION: ECOG PERFORMANCE STATUS: 2 - Symptomatic, <50% confined to bed  Vitals:   12/18/15 1316  BP: (!) 150/65  Pulse: 63  Resp: 16  Temp: 97.7 F (36.5 C)   Filed Weights    GENERAL:alert, no distress and comfortable SKIN: skin color, texture, turgor are normal, no rashes or significant lesions EYES: normal, Conjunctiva are pink and non-injected, sclera clear OROPHARYNX:no exudate, no erythema and lips, buccal mucosa, and tongue normal  NECK: supple, thyroid normal size, non-tender, without nodularity LYMPH:  no palpable lymphadenopathy in the cervical, axillary or inguinal LUNGS: clear to auscultation and percussion with normal breathing effort HEART: regular rate & rhythm and no murmurs and no lower extremity  edema ABDOMEN:abdomen soft, non-tender and normal bowel sounds Musculoskeletal:no cyanosis of digits and no clubbing  NEURO: alert & oriented x 3 with fluent speech, no focal motor/sensory deficits  LABORATORY DATA:  I have reviewed the data as listed    Component Value Date/Time   NA 136 12/18/2015 1300   K 4.2 12/18/2015 1300   CL 101 10/23/2015 1443   CO2 22 12/18/2015 1300   GLUCOSE 89 12/18/2015 1300   BUN 33.7 (H) 12/18/2015 1300   CREATININE 1.5 (H) 12/18/2015 1300   CALCIUM 9.2 12/18/2015 1300   PROT 6.9 12/18/2015 1300   ALBUMIN 3.6 12/18/2015 1300   AST 40 (H) 12/18/2015 1300   ALT 53 12/18/2015 1300   ALKPHOS 256 (H) 12/18/2015 1300   BILITOT 0.68 12/18/2015 1300   GFRNONAA 35 (L) 10/12/2015 0830   GFRAA 40 (L) 10/12/2015 0830    No results found for: SPEP, UPEP  Lab Results  Component Value Date   WBC 8.4 12/18/2015   NEUTROABS 5.2 12/18/2015   HGB 12.7 (L) 12/18/2015  HCT 38.3 (L) 12/18/2015   MCV 93.9 12/18/2015   PLT 152 12/18/2015      Chemistry      Component Value Date/Time   NA 136 12/18/2015 1300   K 4.2 12/18/2015 1300   CL 101 10/23/2015 1443   CO2 22 12/18/2015 1300   BUN 33.7 (H) 12/18/2015 1300   CREATININE 1.5 (H) 12/18/2015 1300      Component Value Date/Time   CALCIUM 9.2 12/18/2015 1300   ALKPHOS 256 (H) 12/18/2015 1300   AST 40 (H) 12/18/2015 1300   ALT 53 12/18/2015 1300   BILITOT 0.68 12/18/2015 1300      ASSESSMENT & PLAN:  History of B-cell lymphoma The PET CT scan showed no evidence of active disease. From the lymphoma standpoint, he had only stage I disease. I plan to follow with history, physical examination and blood work only in 6 months and to defer imaging study unless he has signs and symptoms suspicious for clinical recurrence  Anemia in chronic renal disease This is likely anemia of chronic disease. The patient denies recent history of bleeding such as epistaxis, hematuria or hematochezia. He is asymptomatic  from the anemia. We will observe for now.    Preventive measure We discussed the importance of preventive care including high-dose vitamin D supplement in view of diagnosis of lymphoma and recent hip fracture. The patient should also get influenza vaccination but he declines today. He would like to get his vaccination through the assisted living program  Elevated alkaline phosphatase level This is likely related to increased turnover from recent hip fracture. Observe only.   Orders Placed This Encounter  Procedures  . CBC with Differential/Platelet    Standing Status:   Future    Standing Expiration Date:   01/21/2017  . Comprehensive metabolic panel    Standing Status:   Future    Standing Expiration Date:   01/21/2017  . Lactate dehydrogenase    Standing Status:   Future    Standing Expiration Date:   01/21/2017   All questions were answered. The patient knows to call the clinic with any problems, questions or concerns. No barriers to learning was detected. I spent 15 minutes counseling the patient face to face. The total time spent in the appointment was 20 minutes and more than 50% was on counseling and review of test results     Heath Lark, MD 12/18/2015 1:50 PM

## 2015-12-18 NOTE — Assessment & Plan Note (Signed)
The PET CT scan showed no evidence of active disease. From the lymphoma standpoint, he had only stage I disease. I plan to follow with history, physical examination and blood work only in 6 months and to defer imaging study unless he has signs and symptoms suspicious for clinical recurrence

## 2015-12-18 NOTE — Telephone Encounter (Signed)
LMOVM reminding pt to send remote transmission.   

## 2015-12-18 NOTE — Telephone Encounter (Signed)
Collet[facility] [place] called about a fax that was faxed over last week for orders for this patient for pt&ot. Please follow up with her. IF you have the papers just faxed over. Thank you.  [place] [employer]:[phone]

## 2015-12-19 DIAGNOSIS — M5416 Radiculopathy, lumbar region: Secondary | ICD-10-CM | POA: Diagnosis not present

## 2015-12-19 DIAGNOSIS — M5136 Other intervertebral disc degeneration, lumbar region: Secondary | ICD-10-CM | POA: Diagnosis not present

## 2015-12-19 NOTE — Telephone Encounter (Signed)
A fax was received from [facility] and given to PCP in his red folder for review/signature.

## 2015-12-20 ENCOUNTER — Encounter: Payer: Self-pay | Admitting: Internal Medicine

## 2015-12-20 ENCOUNTER — Ambulatory Visit (INDEPENDENT_AMBULATORY_CARE_PROVIDER_SITE_OTHER): Payer: Medicare Other | Admitting: Internal Medicine

## 2015-12-20 VITALS — BP 118/60 | HR 64 | Temp 97.4°F | Wt 154.0 lb

## 2015-12-20 DIAGNOSIS — M5441 Lumbago with sciatica, right side: Secondary | ICD-10-CM | POA: Diagnosis not present

## 2015-12-20 DIAGNOSIS — R413 Other amnesia: Secondary | ICD-10-CM | POA: Diagnosis not present

## 2015-12-20 DIAGNOSIS — E038 Other specified hypothyroidism: Secondary | ICD-10-CM

## 2015-12-20 DIAGNOSIS — Z23 Encounter for immunization: Secondary | ICD-10-CM

## 2015-12-20 DIAGNOSIS — I255 Ischemic cardiomyopathy: Secondary | ICD-10-CM | POA: Diagnosis not present

## 2015-12-20 DIAGNOSIS — I5042 Chronic combined systolic (congestive) and diastolic (congestive) heart failure: Secondary | ICD-10-CM

## 2015-12-20 DIAGNOSIS — E034 Atrophy of thyroid (acquired): Secondary | ICD-10-CM

## 2015-12-20 DIAGNOSIS — M5442 Lumbago with sciatica, left side: Secondary | ICD-10-CM

## 2015-12-20 DIAGNOSIS — I10 Essential (primary) hypertension: Secondary | ICD-10-CM

## 2015-12-20 MED ORDER — TRIAMCINOLONE ACETONIDE 55 MCG/ACT NA AERO: 2.0000 | INHALATION_SPRAY | Freq: Every day | NASAL | 3 refills | Status: AC

## 2015-12-20 MED ORDER — DONEPEZIL HCL 10 MG PO TABS: 10.0000 mg | ORAL_TABLET | Freq: Every day | ORAL | 3 refills | Status: DC

## 2015-12-20 MED ORDER — ATORVASTATIN CALCIUM 20 MG PO TABS: 10.0000 mg | ORAL_TABLET | Freq: Two times a day (BID) | ORAL | 3 refills | Status: DC

## 2015-12-20 NOTE — Assessment & Plan Note (Signed)
Start Aricept 10 mg/d

## 2015-12-20 NOTE — Progress Notes (Signed)
Pre visit review using our clinic review tool, if applicable. No additional management support is needed unless otherwise documented below in the visit note. 

## 2015-12-20 NOTE — Progress Notes (Signed)
Subjective:  Patient ID: [name] [name], male    DOB: June 26, 1922  Age: 82 y.o. MRN: [id number]  CC: No chief complaint on file.   HPI [name] [name] presents for L hip pain, memory loss, COPD  Outpatient Medications Prior to Visit  Medication Sig Dispense Refill  . acetaminophen (TYLENOL) 500 MG tablet Take 500 mg by mouth every 6 (six) hours as needed for headache.    . albuterol (PROVENTIL) (2.5 MG/3ML) 0.083% nebulizer solution USE ONE VIAL VIA NEBULIZER FOUR TIMES DAILY AS NEEDED. 375 mL 11  . AMBULATORY NON FORMULARY MEDICATION Diltiazem gel 2% Apply small amount pea-sized to anal area 2-3 times a day for anal stenosis. Use as needed. 30 g 5  . Artificial Tear Ointment (DRY EYES OP) Apply 1 drop to eye daily.    Marland Kitchen atorvastatin (LIPITOR) 20 MG tablet TAKE 1/2 TABLET TWICE DAILY (Patient taking differently: TAKE 1/2 (10 MG ) TABLET TWICE DAILY) 90 tablet 2  . diltiazem 2 % GEL Apply 1 application topically 3 (three) times daily. 30 g 11  . diphenoxylate-atropine (LOMOTIL) 2.5-0.025 MG tablet Take 1 tablet by mouth 4 (four) times daily as needed for diarrhea or loose stools. 60 tablet 1  . donepezil (ARICEPT) 5 MG tablet Take 1 tablet (5 mg total) by mouth at bedtime. 90 tablet 3  . fluticasone (FLONASE) 50 MCG/ACT nasal spray Place 2 sprays into both nostrils daily. 48 g 3  . folic acid (FOLVITE) Q000111Q MCG tablet Take 400 mcg by mouth daily.     Marland Kitchen levothyroxine (SYNTHROID, LEVOTHROID) 112 MCG tablet Take 1 tablet (112 mcg total) by mouth daily. 30 tablet 11  . metoprolol succinate (TOPROL-XL) 25 MG 24 hr tablet Take 0.5 tablets (12.5 mg total) by mouth 2 (two) times daily. 90 tablet 3  . multivitamin-iron-minerals-folic acid (CENTRUM) chewable tablet Chew 1 tablet by mouth daily.    . nitroGLYCERIN (NITROSTAT) 0.4 MG SL tablet Place 1 tablet (0.4 mg total) under the tongue every 5 (five) minutes as needed. Chest pain 20 tablet 2  . pantoprazole (PROTONIX) 40 MG tablet Take 1 tablet  (40 mg total) by mouth daily. 90 tablet 3  . saccharomyces boulardii (FLORASTOR) 250 MG capsule Take 1 capsule (250 mg total) by mouth 2 (two) times daily. 180 capsule 3  . triamcinolone (NASACORT) 55 MCG/ACT nasal inhaler Place 1 spray into the nose daily. 3 Inhaler 1   No facility-administered medications prior to visit.     ROS Review of Systems  Constitutional: Negative for appetite change, fatigue and unexpected weight change.  HENT: Negative for congestion, nosebleeds, sneezing, sore throat and trouble swallowing.   Eyes: Negative for itching and visual disturbance.  Respiratory: Negative for cough.   Cardiovascular: Negative for chest pain, palpitations and leg swelling.  Gastrointestinal: Negative for abdominal distention, blood in stool, diarrhea and nausea.  Genitourinary: Negative for frequency and hematuria.  Musculoskeletal: Negative for back pain, gait problem, joint swelling and neck pain.  Skin: Negative for rash.  Neurological: Negative for dizziness, tremors, speech difficulty and weakness.  Psychiatric/Behavioral: Negative for agitation, dysphoric mood and sleep disturbance. The patient is not nervous/anxious.     Objective:  BP 118/60   Pulse 64   Temp 97.4 F (36.3 C) (Oral)   Wt 154 lb (69.9 kg)   SpO2 92%   BMI 23.42 kg/m   BP Readings from Last 3 Encounters:  12/20/15 118/60  12/18/15 (!) 150/65  10/23/15 138/78    Wt Readings from  Last 3 Encounters:  12/20/15 154 lb (69.9 kg)  10/23/15 151 lb (68.5 kg)  10/12/15 150 lb (68 kg)    Physical Exam  Constitutional: He is oriented to person, place, and time. He appears well-developed. No distress.  NAD  HENT:  Mouth/Throat: Oropharynx is clear and moist.  Eyes: Conjunctivae are normal. Pupils are equal, round, and reactive to light.  Neck: Normal range of motion. No JVD present. No thyromegaly present.  Cardiovascular: Normal rate, regular rhythm, normal heart sounds and intact distal pulses.   Exam reveals no gallop and no friction rub.   No murmur heard. Pulmonary/Chest: Effort normal and breath sounds normal. No respiratory distress. He has no wheezes. He has no rales. He exhibits no tenderness.  Abdominal: Soft. Bowel sounds are normal. He exhibits no distension and no mass. There is no tenderness. There is no rebound and no guarding.  Musculoskeletal: Normal range of motion. He exhibits no edema or tenderness.  Lymphadenopathy:    He has no cervical adenopathy.  Neurological: He is alert and oriented to person, place, and time. He has normal reflexes. No cranial nerve deficit. He exhibits normal muscle tone. He displays a negative Romberg sign. Coordination and gait normal.  Skin: Skin is warm and dry. No rash noted.  Psychiatric: He has a normal mood and affect. His behavior is normal. Judgment and thought content normal.  in a w/c L hip hurts  Lab Results  Component Value Date   WBC 8.4 12/18/2015   HGB 12.7 (L) 12/18/2015   HCT 38.3 (L) 12/18/2015   PLT 152 12/18/2015   GLUCOSE 89 12/18/2015   CHOL 110 05/25/2013   TRIG 67.0 05/25/2013   HDL 40.30 05/25/2013   LDLCALC 56 05/25/2013   ALT 53 12/18/2015   AST 40 (H) 12/18/2015   NA 136 12/18/2015   K 4.2 12/18/2015   CL 101 10/23/2015   CREATININE 1.5 (H) 12/18/2015   BUN 33.7 (H) 12/18/2015   CO2 22 12/18/2015   TSH 6.20 (H) 06/07/2015   PSA 3.42 11/28/2011   INR 1.15 10/12/2015    Nm Bone Scan 3 Phase  Result Date: 11/03/2015 CLINICAL DATA:  Left hip and left femur pain. EXAM: NUCLEAR MEDICINE 3-PHASE BONE SCAN TECHNIQUE: Radionuclide angiographic images, immediate static blood pool images, and 3-hour delayed static images were obtained of the hips after intravenous injection of radiopharmaceutical. RADIOPHARMACEUTICALS:  27.0 mCi Tc-65m MDP COMPARISON:  07/27/2015 FINDINGS: Vascular phase: There is no abnormal asymmetric increased uptake identified. Blood pool phase: No abnormal asymmetric increased  radiotracer uptake identified. Delayed phase: There is asymmetric increased radiotracer activity localizing to the greater trochanter of the right proximal femur. IMPRESSION: 1. No findings to explain patient's left femur pain. 2. Mild asymmetric increased uptake localizes to the greater trochanter of the right proximal femur. Electronically Signed   By: Kerby Moors M.D.   On: 11/03/2015 17:02    Assessment & Plan:   There are no diagnoses linked to this encounter. I am having Mr. Lersch maintain his folic acid, triamcinolone, fluticasone, nitroGLYCERIN, atorvastatin, metoprolol succinate, acetaminophen, Artificial Tear Ointment (DRY EYES OP), AMBULATORY NON FORMULARY MEDICATION, pantoprazole, diphenoxylate-atropine, albuterol, levothyroxine, diltiazem, donepezil, saccharomyces boulardii, and multivitamin-iron-minerals-folic acid.  No orders of the defined types were placed in this encounter.    Follow-up: No Follow-up on file.  Walker Kehr, MD

## 2015-12-21 NOTE — Progress Notes (Signed)
Remote pacemaker transmission.   

## 2015-12-22 DIAGNOSIS — H5213 Myopia, bilateral: Secondary | ICD-10-CM | POA: Diagnosis not present

## 2015-12-22 DIAGNOSIS — H4423 Degenerative myopia, bilateral: Secondary | ICD-10-CM | POA: Diagnosis not present

## 2015-12-22 NOTE — Assessment & Plan Note (Signed)
On Metoprolol 

## 2015-12-22 NOTE — Assessment & Plan Note (Signed)
On Levothroid 

## 2015-12-22 NOTE — Assessment & Plan Note (Signed)
Compensated 

## 2015-12-22 NOTE — Assessment & Plan Note (Signed)
Chronic Recurrent compr fx's Dr Nelva Bush

## 2015-12-25 DIAGNOSIS — R2681 Unsteadiness on feet: Secondary | ICD-10-CM | POA: Diagnosis not present

## 2015-12-25 DIAGNOSIS — R293 Abnormal posture: Secondary | ICD-10-CM | POA: Diagnosis not present

## 2015-12-25 DIAGNOSIS — R278 Other lack of coordination: Secondary | ICD-10-CM | POA: Diagnosis not present

## 2015-12-25 DIAGNOSIS — R29898 Other symptoms and signs involving the musculoskeletal system: Secondary | ICD-10-CM | POA: Diagnosis not present

## 2015-12-25 DIAGNOSIS — M160 Bilateral primary osteoarthritis of hip: Secondary | ICD-10-CM | POA: Diagnosis not present

## 2015-12-25 DIAGNOSIS — M6281 Muscle weakness (generalized): Secondary | ICD-10-CM | POA: Diagnosis not present

## 2015-12-25 DIAGNOSIS — R2689 Other abnormalities of gait and mobility: Secondary | ICD-10-CM | POA: Diagnosis not present

## 2015-12-26 DIAGNOSIS — R2681 Unsteadiness on feet: Secondary | ICD-10-CM | POA: Diagnosis not present

## 2015-12-26 DIAGNOSIS — M6281 Muscle weakness (generalized): Secondary | ICD-10-CM | POA: Diagnosis not present

## 2015-12-26 DIAGNOSIS — R293 Abnormal posture: Secondary | ICD-10-CM | POA: Diagnosis not present

## 2015-12-26 DIAGNOSIS — M160 Bilateral primary osteoarthritis of hip: Secondary | ICD-10-CM | POA: Diagnosis not present

## 2015-12-26 DIAGNOSIS — R2689 Other abnormalities of gait and mobility: Secondary | ICD-10-CM | POA: Diagnosis not present

## 2015-12-26 DIAGNOSIS — R278 Other lack of coordination: Secondary | ICD-10-CM | POA: Diagnosis not present

## 2015-12-27 ENCOUNTER — Encounter: Payer: Self-pay | Admitting: Cardiology

## 2015-12-27 DIAGNOSIS — M6281 Muscle weakness (generalized): Secondary | ICD-10-CM | POA: Diagnosis not present

## 2015-12-27 DIAGNOSIS — R278 Other lack of coordination: Secondary | ICD-10-CM | POA: Diagnosis not present

## 2015-12-27 DIAGNOSIS — R293 Abnormal posture: Secondary | ICD-10-CM | POA: Diagnosis not present

## 2015-12-27 DIAGNOSIS — R2681 Unsteadiness on feet: Secondary | ICD-10-CM | POA: Diagnosis not present

## 2015-12-27 DIAGNOSIS — R2689 Other abnormalities of gait and mobility: Secondary | ICD-10-CM | POA: Diagnosis not present

## 2015-12-27 DIAGNOSIS — M160 Bilateral primary osteoarthritis of hip: Secondary | ICD-10-CM | POA: Diagnosis not present

## 2015-12-28 DIAGNOSIS — R2689 Other abnormalities of gait and mobility: Secondary | ICD-10-CM | POA: Diagnosis not present

## 2015-12-28 DIAGNOSIS — R2681 Unsteadiness on feet: Secondary | ICD-10-CM | POA: Diagnosis not present

## 2015-12-28 DIAGNOSIS — M160 Bilateral primary osteoarthritis of hip: Secondary | ICD-10-CM | POA: Diagnosis not present

## 2015-12-28 DIAGNOSIS — R293 Abnormal posture: Secondary | ICD-10-CM | POA: Diagnosis not present

## 2015-12-28 DIAGNOSIS — M6281 Muscle weakness (generalized): Secondary | ICD-10-CM | POA: Diagnosis not present

## 2015-12-28 DIAGNOSIS — R278 Other lack of coordination: Secondary | ICD-10-CM | POA: Diagnosis not present

## 2015-12-29 DIAGNOSIS — R293 Abnormal posture: Secondary | ICD-10-CM | POA: Diagnosis not present

## 2015-12-29 DIAGNOSIS — R2689 Other abnormalities of gait and mobility: Secondary | ICD-10-CM | POA: Diagnosis not present

## 2015-12-29 DIAGNOSIS — R2681 Unsteadiness on feet: Secondary | ICD-10-CM | POA: Diagnosis not present

## 2015-12-29 DIAGNOSIS — M6281 Muscle weakness (generalized): Secondary | ICD-10-CM | POA: Diagnosis not present

## 2015-12-29 DIAGNOSIS — M160 Bilateral primary osteoarthritis of hip: Secondary | ICD-10-CM | POA: Diagnosis not present

## 2015-12-29 DIAGNOSIS — R278 Other lack of coordination: Secondary | ICD-10-CM | POA: Diagnosis not present

## 2016-01-01 DIAGNOSIS — R278 Other lack of coordination: Secondary | ICD-10-CM | POA: Diagnosis not present

## 2016-01-01 DIAGNOSIS — R2689 Other abnormalities of gait and mobility: Secondary | ICD-10-CM | POA: Diagnosis not present

## 2016-01-01 DIAGNOSIS — M6281 Muscle weakness (generalized): Secondary | ICD-10-CM | POA: Diagnosis not present

## 2016-01-01 DIAGNOSIS — R2681 Unsteadiness on feet: Secondary | ICD-10-CM | POA: Diagnosis not present

## 2016-01-01 DIAGNOSIS — M160 Bilateral primary osteoarthritis of hip: Secondary | ICD-10-CM | POA: Diagnosis not present

## 2016-01-01 DIAGNOSIS — R293 Abnormal posture: Secondary | ICD-10-CM | POA: Diagnosis not present

## 2016-01-02 DIAGNOSIS — R293 Abnormal posture: Secondary | ICD-10-CM | POA: Diagnosis not present

## 2016-01-02 DIAGNOSIS — R2689 Other abnormalities of gait and mobility: Secondary | ICD-10-CM | POA: Diagnosis not present

## 2016-01-02 DIAGNOSIS — R2681 Unsteadiness on feet: Secondary | ICD-10-CM | POA: Diagnosis not present

## 2016-01-02 DIAGNOSIS — R278 Other lack of coordination: Secondary | ICD-10-CM | POA: Diagnosis not present

## 2016-01-02 DIAGNOSIS — M6281 Muscle weakness (generalized): Secondary | ICD-10-CM | POA: Diagnosis not present

## 2016-01-02 DIAGNOSIS — M160 Bilateral primary osteoarthritis of hip: Secondary | ICD-10-CM | POA: Diagnosis not present

## 2016-01-03 DIAGNOSIS — M6281 Muscle weakness (generalized): Secondary | ICD-10-CM | POA: Diagnosis not present

## 2016-01-03 DIAGNOSIS — M160 Bilateral primary osteoarthritis of hip: Secondary | ICD-10-CM | POA: Diagnosis not present

## 2016-01-03 DIAGNOSIS — R293 Abnormal posture: Secondary | ICD-10-CM | POA: Diagnosis not present

## 2016-01-03 DIAGNOSIS — R278 Other lack of coordination: Secondary | ICD-10-CM | POA: Diagnosis not present

## 2016-01-03 DIAGNOSIS — R2681 Unsteadiness on feet: Secondary | ICD-10-CM | POA: Diagnosis not present

## 2016-01-03 DIAGNOSIS — R2689 Other abnormalities of gait and mobility: Secondary | ICD-10-CM | POA: Diagnosis not present

## 2016-01-04 DIAGNOSIS — R278 Other lack of coordination: Secondary | ICD-10-CM | POA: Diagnosis not present

## 2016-01-04 DIAGNOSIS — M160 Bilateral primary osteoarthritis of hip: Secondary | ICD-10-CM | POA: Diagnosis not present

## 2016-01-04 DIAGNOSIS — R2681 Unsteadiness on feet: Secondary | ICD-10-CM | POA: Diagnosis not present

## 2016-01-04 DIAGNOSIS — M6281 Muscle weakness (generalized): Secondary | ICD-10-CM | POA: Diagnosis not present

## 2016-01-04 DIAGNOSIS — R2689 Other abnormalities of gait and mobility: Secondary | ICD-10-CM | POA: Diagnosis not present

## 2016-01-04 DIAGNOSIS — R293 Abnormal posture: Secondary | ICD-10-CM | POA: Diagnosis not present

## 2016-01-04 DIAGNOSIS — L820 Inflamed seborrheic keratosis: Secondary | ICD-10-CM | POA: Diagnosis not present

## 2016-01-04 DIAGNOSIS — L570 Actinic keratosis: Secondary | ICD-10-CM | POA: Diagnosis not present

## 2016-01-04 DIAGNOSIS — X32XXXD Exposure to sunlight, subsequent encounter: Secondary | ICD-10-CM | POA: Diagnosis not present

## 2016-01-05 DIAGNOSIS — M160 Bilateral primary osteoarthritis of hip: Secondary | ICD-10-CM | POA: Diagnosis not present

## 2016-01-05 DIAGNOSIS — R2689 Other abnormalities of gait and mobility: Secondary | ICD-10-CM | POA: Diagnosis not present

## 2016-01-05 DIAGNOSIS — R293 Abnormal posture: Secondary | ICD-10-CM | POA: Diagnosis not present

## 2016-01-05 DIAGNOSIS — M6281 Muscle weakness (generalized): Secondary | ICD-10-CM | POA: Diagnosis not present

## 2016-01-05 DIAGNOSIS — R278 Other lack of coordination: Secondary | ICD-10-CM | POA: Diagnosis not present

## 2016-01-05 DIAGNOSIS — R2681 Unsteadiness on feet: Secondary | ICD-10-CM | POA: Diagnosis not present

## 2016-01-09 DIAGNOSIS — M160 Bilateral primary osteoarthritis of hip: Secondary | ICD-10-CM | POA: Diagnosis not present

## 2016-01-09 DIAGNOSIS — R2689 Other abnormalities of gait and mobility: Secondary | ICD-10-CM | POA: Diagnosis not present

## 2016-01-09 DIAGNOSIS — R293 Abnormal posture: Secondary | ICD-10-CM | POA: Diagnosis not present

## 2016-01-09 DIAGNOSIS — M6281 Muscle weakness (generalized): Secondary | ICD-10-CM | POA: Diagnosis not present

## 2016-01-09 DIAGNOSIS — R2681 Unsteadiness on feet: Secondary | ICD-10-CM | POA: Diagnosis not present

## 2016-01-09 DIAGNOSIS — R278 Other lack of coordination: Secondary | ICD-10-CM | POA: Diagnosis not present

## 2016-01-10 DIAGNOSIS — R2689 Other abnormalities of gait and mobility: Secondary | ICD-10-CM | POA: Diagnosis not present

## 2016-01-10 DIAGNOSIS — M160 Bilateral primary osteoarthritis of hip: Secondary | ICD-10-CM | POA: Diagnosis not present

## 2016-01-10 DIAGNOSIS — R2681 Unsteadiness on feet: Secondary | ICD-10-CM | POA: Diagnosis not present

## 2016-01-10 DIAGNOSIS — M6281 Muscle weakness (generalized): Secondary | ICD-10-CM | POA: Diagnosis not present

## 2016-01-10 DIAGNOSIS — R278 Other lack of coordination: Secondary | ICD-10-CM | POA: Diagnosis not present

## 2016-01-10 DIAGNOSIS — R293 Abnormal posture: Secondary | ICD-10-CM | POA: Diagnosis not present

## 2016-01-11 DIAGNOSIS — R293 Abnormal posture: Secondary | ICD-10-CM | POA: Diagnosis not present

## 2016-01-11 DIAGNOSIS — R278 Other lack of coordination: Secondary | ICD-10-CM | POA: Diagnosis not present

## 2016-01-11 DIAGNOSIS — R2689 Other abnormalities of gait and mobility: Secondary | ICD-10-CM | POA: Diagnosis not present

## 2016-01-11 DIAGNOSIS — R2681 Unsteadiness on feet: Secondary | ICD-10-CM | POA: Diagnosis not present

## 2016-01-11 DIAGNOSIS — M160 Bilateral primary osteoarthritis of hip: Secondary | ICD-10-CM | POA: Diagnosis not present

## 2016-01-11 DIAGNOSIS — M6281 Muscle weakness (generalized): Secondary | ICD-10-CM | POA: Diagnosis not present

## 2016-01-11 LAB — CUP PACEART REMOTE DEVICE CHECK
Battery Remaining Longevity: 145 mo
Brady Statistic AP VS Percent: 94 %
Brady Statistic AS VS Percent: 4 %
Implantable Lead Implant Date: 20031204
Lead Channel Impedance Value: 447 Ohm
Lead Channel Pacing Threshold Amplitude: 0.875 V
Lead Channel Pacing Threshold Pulse Width: 0.4 ms
Lead Channel Sensing Intrinsic Amplitude: 16 mV
Lead Channel Setting Pacing Amplitude: 2 V
Lead Channel Setting Pacing Pulse Width: 0.4 ms
Lead Channel Setting Sensing Sensitivity: 5.6 mV
MDC IDC LEAD IMPLANT DT: 20031204
MDC IDC LEAD LOCATION: 753859
MDC IDC LEAD LOCATION: 753860
MDC IDC MSMT BATTERY IMPEDANCE: 100 Ohm
MDC IDC MSMT BATTERY VOLTAGE: 2.79 V
MDC IDC MSMT LEADCHNL RA IMPEDANCE VALUE: 513 Ohm
MDC IDC MSMT LEADCHNL RA PACING THRESHOLD AMPLITUDE: 0.625 V
MDC IDC MSMT LEADCHNL RA PACING THRESHOLD PULSEWIDTH: 0.4 ms
MDC IDC SESS DTM: 20170927123640
MDC IDC SET LEADCHNL RV PACING AMPLITUDE: 2.5 V
MDC IDC STAT BRADY AP VP PERCENT: 2 %
MDC IDC STAT BRADY AS VP PERCENT: 0 %

## 2016-01-12 DIAGNOSIS — R2689 Other abnormalities of gait and mobility: Secondary | ICD-10-CM | POA: Diagnosis not present

## 2016-01-12 DIAGNOSIS — R278 Other lack of coordination: Secondary | ICD-10-CM | POA: Diagnosis not present

## 2016-01-12 DIAGNOSIS — R2681 Unsteadiness on feet: Secondary | ICD-10-CM | POA: Diagnosis not present

## 2016-01-12 DIAGNOSIS — M6281 Muscle weakness (generalized): Secondary | ICD-10-CM | POA: Diagnosis not present

## 2016-01-12 DIAGNOSIS — R293 Abnormal posture: Secondary | ICD-10-CM | POA: Diagnosis not present

## 2016-01-12 DIAGNOSIS — M160 Bilateral primary osteoarthritis of hip: Secondary | ICD-10-CM | POA: Diagnosis not present

## 2016-01-15 DIAGNOSIS — R293 Abnormal posture: Secondary | ICD-10-CM | POA: Diagnosis not present

## 2016-01-15 DIAGNOSIS — M160 Bilateral primary osteoarthritis of hip: Secondary | ICD-10-CM | POA: Diagnosis not present

## 2016-01-15 DIAGNOSIS — M6281 Muscle weakness (generalized): Secondary | ICD-10-CM | POA: Diagnosis not present

## 2016-01-15 DIAGNOSIS — R278 Other lack of coordination: Secondary | ICD-10-CM | POA: Diagnosis not present

## 2016-01-15 DIAGNOSIS — R2689 Other abnormalities of gait and mobility: Secondary | ICD-10-CM | POA: Diagnosis not present

## 2016-01-15 DIAGNOSIS — R2681 Unsteadiness on feet: Secondary | ICD-10-CM | POA: Diagnosis not present

## 2016-01-16 DIAGNOSIS — R278 Other lack of coordination: Secondary | ICD-10-CM | POA: Diagnosis not present

## 2016-01-16 DIAGNOSIS — R2689 Other abnormalities of gait and mobility: Secondary | ICD-10-CM | POA: Diagnosis not present

## 2016-01-16 DIAGNOSIS — M160 Bilateral primary osteoarthritis of hip: Secondary | ICD-10-CM | POA: Diagnosis not present

## 2016-01-16 DIAGNOSIS — M6281 Muscle weakness (generalized): Secondary | ICD-10-CM | POA: Diagnosis not present

## 2016-01-16 DIAGNOSIS — R293 Abnormal posture: Secondary | ICD-10-CM | POA: Diagnosis not present

## 2016-01-16 DIAGNOSIS — R2681 Unsteadiness on feet: Secondary | ICD-10-CM | POA: Diagnosis not present

## 2016-01-17 DIAGNOSIS — M6281 Muscle weakness (generalized): Secondary | ICD-10-CM | POA: Diagnosis not present

## 2016-01-17 DIAGNOSIS — R2681 Unsteadiness on feet: Secondary | ICD-10-CM | POA: Diagnosis not present

## 2016-01-17 DIAGNOSIS — R278 Other lack of coordination: Secondary | ICD-10-CM | POA: Diagnosis not present

## 2016-01-17 DIAGNOSIS — M160 Bilateral primary osteoarthritis of hip: Secondary | ICD-10-CM | POA: Diagnosis not present

## 2016-01-17 DIAGNOSIS — R2689 Other abnormalities of gait and mobility: Secondary | ICD-10-CM | POA: Diagnosis not present

## 2016-01-17 DIAGNOSIS — R293 Abnormal posture: Secondary | ICD-10-CM | POA: Diagnosis not present

## 2016-01-18 DIAGNOSIS — R293 Abnormal posture: Secondary | ICD-10-CM | POA: Diagnosis not present

## 2016-01-18 DIAGNOSIS — R2681 Unsteadiness on feet: Secondary | ICD-10-CM | POA: Diagnosis not present

## 2016-01-18 DIAGNOSIS — M160 Bilateral primary osteoarthritis of hip: Secondary | ICD-10-CM | POA: Diagnosis not present

## 2016-01-18 DIAGNOSIS — M6281 Muscle weakness (generalized): Secondary | ICD-10-CM | POA: Diagnosis not present

## 2016-01-18 DIAGNOSIS — R2689 Other abnormalities of gait and mobility: Secondary | ICD-10-CM | POA: Diagnosis not present

## 2016-01-18 DIAGNOSIS — R278 Other lack of coordination: Secondary | ICD-10-CM | POA: Diagnosis not present

## 2016-01-19 DIAGNOSIS — M5416 Radiculopathy, lumbar region: Secondary | ICD-10-CM | POA: Diagnosis not present

## 2016-01-19 DIAGNOSIS — M5137 Other intervertebral disc degeneration, lumbosacral region: Secondary | ICD-10-CM | POA: Diagnosis not present

## 2016-01-19 DIAGNOSIS — M5136 Other intervertebral disc degeneration, lumbar region: Secondary | ICD-10-CM | POA: Diagnosis not present

## 2016-01-22 DIAGNOSIS — M160 Bilateral primary osteoarthritis of hip: Secondary | ICD-10-CM | POA: Diagnosis not present

## 2016-01-22 DIAGNOSIS — M6281 Muscle weakness (generalized): Secondary | ICD-10-CM | POA: Diagnosis not present

## 2016-01-22 DIAGNOSIS — R278 Other lack of coordination: Secondary | ICD-10-CM | POA: Diagnosis not present

## 2016-01-22 DIAGNOSIS — R2681 Unsteadiness on feet: Secondary | ICD-10-CM | POA: Diagnosis not present

## 2016-01-22 DIAGNOSIS — R2689 Other abnormalities of gait and mobility: Secondary | ICD-10-CM | POA: Diagnosis not present

## 2016-01-22 DIAGNOSIS — R293 Abnormal posture: Secondary | ICD-10-CM | POA: Diagnosis not present

## 2016-01-23 DIAGNOSIS — R278 Other lack of coordination: Secondary | ICD-10-CM | POA: Diagnosis not present

## 2016-01-23 DIAGNOSIS — R2689 Other abnormalities of gait and mobility: Secondary | ICD-10-CM | POA: Diagnosis not present

## 2016-01-23 DIAGNOSIS — M160 Bilateral primary osteoarthritis of hip: Secondary | ICD-10-CM | POA: Diagnosis not present

## 2016-01-23 DIAGNOSIS — R293 Abnormal posture: Secondary | ICD-10-CM | POA: Diagnosis not present

## 2016-01-23 DIAGNOSIS — R2681 Unsteadiness on feet: Secondary | ICD-10-CM | POA: Diagnosis not present

## 2016-01-23 DIAGNOSIS — M6281 Muscle weakness (generalized): Secondary | ICD-10-CM | POA: Diagnosis not present

## 2016-01-24 DIAGNOSIS — R278 Other lack of coordination: Secondary | ICD-10-CM | POA: Diagnosis not present

## 2016-01-24 DIAGNOSIS — M6281 Muscle weakness (generalized): Secondary | ICD-10-CM | POA: Diagnosis not present

## 2016-01-25 DIAGNOSIS — R293 Abnormal posture: Secondary | ICD-10-CM | POA: Diagnosis not present

## 2016-01-25 DIAGNOSIS — M160 Bilateral primary osteoarthritis of hip: Secondary | ICD-10-CM | POA: Diagnosis not present

## 2016-01-25 DIAGNOSIS — R29898 Other symptoms and signs involving the musculoskeletal system: Secondary | ICD-10-CM | POA: Diagnosis not present

## 2016-01-25 DIAGNOSIS — M6281 Muscle weakness (generalized): Secondary | ICD-10-CM | POA: Diagnosis not present

## 2016-01-25 DIAGNOSIS — R2681 Unsteadiness on feet: Secondary | ICD-10-CM | POA: Diagnosis not present

## 2016-01-25 DIAGNOSIS — R278 Other lack of coordination: Secondary | ICD-10-CM | POA: Diagnosis not present

## 2016-01-25 DIAGNOSIS — R2689 Other abnormalities of gait and mobility: Secondary | ICD-10-CM | POA: Diagnosis not present

## 2016-01-26 DIAGNOSIS — M160 Bilateral primary osteoarthritis of hip: Secondary | ICD-10-CM | POA: Diagnosis not present

## 2016-01-26 DIAGNOSIS — R278 Other lack of coordination: Secondary | ICD-10-CM | POA: Diagnosis not present

## 2016-01-26 DIAGNOSIS — R293 Abnormal posture: Secondary | ICD-10-CM | POA: Diagnosis not present

## 2016-01-26 DIAGNOSIS — R2689 Other abnormalities of gait and mobility: Secondary | ICD-10-CM | POA: Diagnosis not present

## 2016-01-26 DIAGNOSIS — M6281 Muscle weakness (generalized): Secondary | ICD-10-CM | POA: Diagnosis not present

## 2016-01-26 DIAGNOSIS — R2681 Unsteadiness on feet: Secondary | ICD-10-CM | POA: Diagnosis not present

## 2016-01-29 DIAGNOSIS — M160 Bilateral primary osteoarthritis of hip: Secondary | ICD-10-CM | POA: Diagnosis not present

## 2016-01-29 DIAGNOSIS — R2689 Other abnormalities of gait and mobility: Secondary | ICD-10-CM | POA: Diagnosis not present

## 2016-01-29 DIAGNOSIS — R293 Abnormal posture: Secondary | ICD-10-CM | POA: Diagnosis not present

## 2016-01-29 DIAGNOSIS — R278 Other lack of coordination: Secondary | ICD-10-CM | POA: Diagnosis not present

## 2016-01-29 DIAGNOSIS — R2681 Unsteadiness on feet: Secondary | ICD-10-CM | POA: Diagnosis not present

## 2016-01-29 DIAGNOSIS — M6281 Muscle weakness (generalized): Secondary | ICD-10-CM | POA: Diagnosis not present

## 2016-01-30 DIAGNOSIS — R2689 Other abnormalities of gait and mobility: Secondary | ICD-10-CM | POA: Diagnosis not present

## 2016-01-30 DIAGNOSIS — R293 Abnormal posture: Secondary | ICD-10-CM | POA: Diagnosis not present

## 2016-01-30 DIAGNOSIS — R2681 Unsteadiness on feet: Secondary | ICD-10-CM | POA: Diagnosis not present

## 2016-01-30 DIAGNOSIS — M6281 Muscle weakness (generalized): Secondary | ICD-10-CM | POA: Diagnosis not present

## 2016-01-30 DIAGNOSIS — M160 Bilateral primary osteoarthritis of hip: Secondary | ICD-10-CM | POA: Diagnosis not present

## 2016-01-30 DIAGNOSIS — R278 Other lack of coordination: Secondary | ICD-10-CM | POA: Diagnosis not present

## 2016-01-31 DIAGNOSIS — R2689 Other abnormalities of gait and mobility: Secondary | ICD-10-CM | POA: Diagnosis not present

## 2016-01-31 DIAGNOSIS — R2681 Unsteadiness on feet: Secondary | ICD-10-CM | POA: Diagnosis not present

## 2016-01-31 DIAGNOSIS — R293 Abnormal posture: Secondary | ICD-10-CM | POA: Diagnosis not present

## 2016-01-31 DIAGNOSIS — R278 Other lack of coordination: Secondary | ICD-10-CM | POA: Diagnosis not present

## 2016-01-31 DIAGNOSIS — M160 Bilateral primary osteoarthritis of hip: Secondary | ICD-10-CM | POA: Diagnosis not present

## 2016-01-31 DIAGNOSIS — M6281 Muscle weakness (generalized): Secondary | ICD-10-CM | POA: Diagnosis not present

## 2016-02-01 DIAGNOSIS — M6281 Muscle weakness (generalized): Secondary | ICD-10-CM | POA: Diagnosis not present

## 2016-02-01 DIAGNOSIS — R2681 Unsteadiness on feet: Secondary | ICD-10-CM | POA: Diagnosis not present

## 2016-02-01 DIAGNOSIS — R293 Abnormal posture: Secondary | ICD-10-CM | POA: Diagnosis not present

## 2016-02-01 DIAGNOSIS — M160 Bilateral primary osteoarthritis of hip: Secondary | ICD-10-CM | POA: Diagnosis not present

## 2016-02-01 DIAGNOSIS — R2689 Other abnormalities of gait and mobility: Secondary | ICD-10-CM | POA: Diagnosis not present

## 2016-02-01 DIAGNOSIS — R278 Other lack of coordination: Secondary | ICD-10-CM | POA: Diagnosis not present

## 2016-02-02 DIAGNOSIS — R278 Other lack of coordination: Secondary | ICD-10-CM | POA: Diagnosis not present

## 2016-02-02 DIAGNOSIS — M6281 Muscle weakness (generalized): Secondary | ICD-10-CM | POA: Diagnosis not present

## 2016-02-02 DIAGNOSIS — R2681 Unsteadiness on feet: Secondary | ICD-10-CM | POA: Diagnosis not present

## 2016-02-02 DIAGNOSIS — R293 Abnormal posture: Secondary | ICD-10-CM | POA: Diagnosis not present

## 2016-02-02 DIAGNOSIS — R2689 Other abnormalities of gait and mobility: Secondary | ICD-10-CM | POA: Diagnosis not present

## 2016-02-02 DIAGNOSIS — M160 Bilateral primary osteoarthritis of hip: Secondary | ICD-10-CM | POA: Diagnosis not present

## 2016-02-05 DIAGNOSIS — R278 Other lack of coordination: Secondary | ICD-10-CM | POA: Diagnosis not present

## 2016-02-05 DIAGNOSIS — R293 Abnormal posture: Secondary | ICD-10-CM | POA: Diagnosis not present

## 2016-02-05 DIAGNOSIS — M6281 Muscle weakness (generalized): Secondary | ICD-10-CM | POA: Diagnosis not present

## 2016-02-05 DIAGNOSIS — M160 Bilateral primary osteoarthritis of hip: Secondary | ICD-10-CM | POA: Diagnosis not present

## 2016-02-05 DIAGNOSIS — R2681 Unsteadiness on feet: Secondary | ICD-10-CM | POA: Diagnosis not present

## 2016-02-05 DIAGNOSIS — R2689 Other abnormalities of gait and mobility: Secondary | ICD-10-CM | POA: Diagnosis not present

## 2016-02-06 DIAGNOSIS — R2681 Unsteadiness on feet: Secondary | ICD-10-CM | POA: Diagnosis not present

## 2016-02-06 DIAGNOSIS — M160 Bilateral primary osteoarthritis of hip: Secondary | ICD-10-CM | POA: Diagnosis not present

## 2016-02-06 DIAGNOSIS — R293 Abnormal posture: Secondary | ICD-10-CM | POA: Diagnosis not present

## 2016-02-06 DIAGNOSIS — R2689 Other abnormalities of gait and mobility: Secondary | ICD-10-CM | POA: Diagnosis not present

## 2016-02-06 DIAGNOSIS — R278 Other lack of coordination: Secondary | ICD-10-CM | POA: Diagnosis not present

## 2016-02-06 DIAGNOSIS — M5136 Other intervertebral disc degeneration, lumbar region: Secondary | ICD-10-CM | POA: Diagnosis not present

## 2016-02-06 DIAGNOSIS — M5416 Radiculopathy, lumbar region: Secondary | ICD-10-CM | POA: Diagnosis not present

## 2016-02-06 DIAGNOSIS — M6281 Muscle weakness (generalized): Secondary | ICD-10-CM | POA: Diagnosis not present

## 2016-02-07 DIAGNOSIS — R278 Other lack of coordination: Secondary | ICD-10-CM | POA: Diagnosis not present

## 2016-02-07 DIAGNOSIS — R293 Abnormal posture: Secondary | ICD-10-CM | POA: Diagnosis not present

## 2016-02-07 DIAGNOSIS — R2689 Other abnormalities of gait and mobility: Secondary | ICD-10-CM | POA: Diagnosis not present

## 2016-02-07 DIAGNOSIS — M6281 Muscle weakness (generalized): Secondary | ICD-10-CM | POA: Diagnosis not present

## 2016-02-07 DIAGNOSIS — R2681 Unsteadiness on feet: Secondary | ICD-10-CM | POA: Diagnosis not present

## 2016-02-07 DIAGNOSIS — M160 Bilateral primary osteoarthritis of hip: Secondary | ICD-10-CM | POA: Diagnosis not present

## 2016-02-08 ENCOUNTER — Other Ambulatory Visit: Payer: Self-pay | Admitting: Internal Medicine

## 2016-02-08 DIAGNOSIS — R2681 Unsteadiness on feet: Secondary | ICD-10-CM | POA: Diagnosis not present

## 2016-02-08 DIAGNOSIS — R278 Other lack of coordination: Secondary | ICD-10-CM | POA: Diagnosis not present

## 2016-02-08 DIAGNOSIS — R293 Abnormal posture: Secondary | ICD-10-CM | POA: Diagnosis not present

## 2016-02-08 DIAGNOSIS — R2689 Other abnormalities of gait and mobility: Secondary | ICD-10-CM | POA: Diagnosis not present

## 2016-02-08 DIAGNOSIS — M160 Bilateral primary osteoarthritis of hip: Secondary | ICD-10-CM | POA: Diagnosis not present

## 2016-02-08 DIAGNOSIS — M6281 Muscle weakness (generalized): Secondary | ICD-10-CM | POA: Diagnosis not present

## 2016-02-09 DIAGNOSIS — R293 Abnormal posture: Secondary | ICD-10-CM | POA: Diagnosis not present

## 2016-02-09 DIAGNOSIS — R278 Other lack of coordination: Secondary | ICD-10-CM | POA: Diagnosis not present

## 2016-02-09 DIAGNOSIS — M160 Bilateral primary osteoarthritis of hip: Secondary | ICD-10-CM | POA: Diagnosis not present

## 2016-02-09 DIAGNOSIS — R2681 Unsteadiness on feet: Secondary | ICD-10-CM | POA: Diagnosis not present

## 2016-02-09 DIAGNOSIS — R2689 Other abnormalities of gait and mobility: Secondary | ICD-10-CM | POA: Diagnosis not present

## 2016-02-09 DIAGNOSIS — M6281 Muscle weakness (generalized): Secondary | ICD-10-CM | POA: Diagnosis not present

## 2016-02-12 DIAGNOSIS — R293 Abnormal posture: Secondary | ICD-10-CM | POA: Diagnosis not present

## 2016-02-12 DIAGNOSIS — R278 Other lack of coordination: Secondary | ICD-10-CM | POA: Diagnosis not present

## 2016-02-12 DIAGNOSIS — R2681 Unsteadiness on feet: Secondary | ICD-10-CM | POA: Diagnosis not present

## 2016-02-12 DIAGNOSIS — M160 Bilateral primary osteoarthritis of hip: Secondary | ICD-10-CM | POA: Diagnosis not present

## 2016-02-12 DIAGNOSIS — M6281 Muscle weakness (generalized): Secondary | ICD-10-CM | POA: Diagnosis not present

## 2016-02-12 DIAGNOSIS — R2689 Other abnormalities of gait and mobility: Secondary | ICD-10-CM | POA: Diagnosis not present

## 2016-02-13 DIAGNOSIS — M6281 Muscle weakness (generalized): Secondary | ICD-10-CM | POA: Diagnosis not present

## 2016-02-13 DIAGNOSIS — R2681 Unsteadiness on feet: Secondary | ICD-10-CM | POA: Diagnosis not present

## 2016-02-13 DIAGNOSIS — M160 Bilateral primary osteoarthritis of hip: Secondary | ICD-10-CM | POA: Diagnosis not present

## 2016-02-13 DIAGNOSIS — R278 Other lack of coordination: Secondary | ICD-10-CM | POA: Diagnosis not present

## 2016-02-13 DIAGNOSIS — R293 Abnormal posture: Secondary | ICD-10-CM | POA: Diagnosis not present

## 2016-02-13 DIAGNOSIS — R2689 Other abnormalities of gait and mobility: Secondary | ICD-10-CM | POA: Diagnosis not present

## 2016-02-14 DIAGNOSIS — R2689 Other abnormalities of gait and mobility: Secondary | ICD-10-CM | POA: Diagnosis not present

## 2016-02-14 DIAGNOSIS — R293 Abnormal posture: Secondary | ICD-10-CM | POA: Diagnosis not present

## 2016-02-14 DIAGNOSIS — M160 Bilateral primary osteoarthritis of hip: Secondary | ICD-10-CM | POA: Diagnosis not present

## 2016-02-14 DIAGNOSIS — R2681 Unsteadiness on feet: Secondary | ICD-10-CM | POA: Diagnosis not present

## 2016-02-14 DIAGNOSIS — R278 Other lack of coordination: Secondary | ICD-10-CM | POA: Diagnosis not present

## 2016-02-14 DIAGNOSIS — M6281 Muscle weakness (generalized): Secondary | ICD-10-CM | POA: Diagnosis not present

## 2016-02-16 DIAGNOSIS — M6281 Muscle weakness (generalized): Secondary | ICD-10-CM | POA: Diagnosis not present

## 2016-02-16 DIAGNOSIS — R2681 Unsteadiness on feet: Secondary | ICD-10-CM | POA: Diagnosis not present

## 2016-02-16 DIAGNOSIS — R2689 Other abnormalities of gait and mobility: Secondary | ICD-10-CM | POA: Diagnosis not present

## 2016-02-16 DIAGNOSIS — M160 Bilateral primary osteoarthritis of hip: Secondary | ICD-10-CM | POA: Diagnosis not present

## 2016-02-16 DIAGNOSIS — R278 Other lack of coordination: Secondary | ICD-10-CM | POA: Diagnosis not present

## 2016-02-16 DIAGNOSIS — R293 Abnormal posture: Secondary | ICD-10-CM | POA: Diagnosis not present

## 2016-02-19 DIAGNOSIS — M160 Bilateral primary osteoarthritis of hip: Secondary | ICD-10-CM | POA: Diagnosis not present

## 2016-02-19 DIAGNOSIS — M6281 Muscle weakness (generalized): Secondary | ICD-10-CM | POA: Diagnosis not present

## 2016-02-19 DIAGNOSIS — R2681 Unsteadiness on feet: Secondary | ICD-10-CM | POA: Diagnosis not present

## 2016-02-19 DIAGNOSIS — R278 Other lack of coordination: Secondary | ICD-10-CM | POA: Diagnosis not present

## 2016-02-19 DIAGNOSIS — R2689 Other abnormalities of gait and mobility: Secondary | ICD-10-CM | POA: Diagnosis not present

## 2016-02-19 DIAGNOSIS — R293 Abnormal posture: Secondary | ICD-10-CM | POA: Diagnosis not present

## 2016-02-20 DIAGNOSIS — R2689 Other abnormalities of gait and mobility: Secondary | ICD-10-CM | POA: Diagnosis not present

## 2016-02-20 DIAGNOSIS — M160 Bilateral primary osteoarthritis of hip: Secondary | ICD-10-CM | POA: Diagnosis not present

## 2016-02-20 DIAGNOSIS — R278 Other lack of coordination: Secondary | ICD-10-CM | POA: Diagnosis not present

## 2016-02-20 DIAGNOSIS — R2681 Unsteadiness on feet: Secondary | ICD-10-CM | POA: Diagnosis not present

## 2016-02-20 DIAGNOSIS — M6281 Muscle weakness (generalized): Secondary | ICD-10-CM | POA: Diagnosis not present

## 2016-02-20 DIAGNOSIS — R293 Abnormal posture: Secondary | ICD-10-CM | POA: Diagnosis not present

## 2016-02-21 DIAGNOSIS — R2689 Other abnormalities of gait and mobility: Secondary | ICD-10-CM | POA: Diagnosis not present

## 2016-02-21 DIAGNOSIS — M6281 Muscle weakness (generalized): Secondary | ICD-10-CM | POA: Diagnosis not present

## 2016-02-21 DIAGNOSIS — R2681 Unsteadiness on feet: Secondary | ICD-10-CM | POA: Diagnosis not present

## 2016-02-21 DIAGNOSIS — R293 Abnormal posture: Secondary | ICD-10-CM | POA: Diagnosis not present

## 2016-02-21 DIAGNOSIS — M160 Bilateral primary osteoarthritis of hip: Secondary | ICD-10-CM | POA: Diagnosis not present

## 2016-02-21 DIAGNOSIS — R278 Other lack of coordination: Secondary | ICD-10-CM | POA: Diagnosis not present

## 2016-02-22 DIAGNOSIS — R2689 Other abnormalities of gait and mobility: Secondary | ICD-10-CM | POA: Diagnosis not present

## 2016-02-22 DIAGNOSIS — M6281 Muscle weakness (generalized): Secondary | ICD-10-CM | POA: Diagnosis not present

## 2016-02-22 DIAGNOSIS — R293 Abnormal posture: Secondary | ICD-10-CM | POA: Diagnosis not present

## 2016-02-22 DIAGNOSIS — R2681 Unsteadiness on feet: Secondary | ICD-10-CM | POA: Diagnosis not present

## 2016-02-22 DIAGNOSIS — R278 Other lack of coordination: Secondary | ICD-10-CM | POA: Diagnosis not present

## 2016-02-22 DIAGNOSIS — M160 Bilateral primary osteoarthritis of hip: Secondary | ICD-10-CM | POA: Diagnosis not present

## 2016-02-26 DIAGNOSIS — M160 Bilateral primary osteoarthritis of hip: Secondary | ICD-10-CM | POA: Diagnosis not present

## 2016-02-26 DIAGNOSIS — R2681 Unsteadiness on feet: Secondary | ICD-10-CM | POA: Diagnosis not present

## 2016-02-26 DIAGNOSIS — R2689 Other abnormalities of gait and mobility: Secondary | ICD-10-CM | POA: Diagnosis not present

## 2016-02-26 DIAGNOSIS — R293 Abnormal posture: Secondary | ICD-10-CM | POA: Diagnosis not present

## 2016-02-26 DIAGNOSIS — R278 Other lack of coordination: Secondary | ICD-10-CM | POA: Diagnosis not present

## 2016-02-26 DIAGNOSIS — R29898 Other symptoms and signs involving the musculoskeletal system: Secondary | ICD-10-CM | POA: Diagnosis not present

## 2016-02-26 DIAGNOSIS — M6281 Muscle weakness (generalized): Secondary | ICD-10-CM | POA: Diagnosis not present

## 2016-02-27 DIAGNOSIS — R278 Other lack of coordination: Secondary | ICD-10-CM | POA: Diagnosis not present

## 2016-02-27 DIAGNOSIS — R293 Abnormal posture: Secondary | ICD-10-CM | POA: Diagnosis not present

## 2016-02-27 DIAGNOSIS — M160 Bilateral primary osteoarthritis of hip: Secondary | ICD-10-CM | POA: Diagnosis not present

## 2016-02-27 DIAGNOSIS — R2681 Unsteadiness on feet: Secondary | ICD-10-CM | POA: Diagnosis not present

## 2016-02-27 DIAGNOSIS — M6281 Muscle weakness (generalized): Secondary | ICD-10-CM | POA: Diagnosis not present

## 2016-02-27 DIAGNOSIS — R2689 Other abnormalities of gait and mobility: Secondary | ICD-10-CM | POA: Diagnosis not present

## 2016-02-28 DIAGNOSIS — M6281 Muscle weakness (generalized): Secondary | ICD-10-CM | POA: Diagnosis not present

## 2016-02-28 DIAGNOSIS — R2689 Other abnormalities of gait and mobility: Secondary | ICD-10-CM | POA: Diagnosis not present

## 2016-02-28 DIAGNOSIS — R2681 Unsteadiness on feet: Secondary | ICD-10-CM | POA: Diagnosis not present

## 2016-02-28 DIAGNOSIS — M160 Bilateral primary osteoarthritis of hip: Secondary | ICD-10-CM | POA: Diagnosis not present

## 2016-02-28 DIAGNOSIS — R278 Other lack of coordination: Secondary | ICD-10-CM | POA: Diagnosis not present

## 2016-02-28 DIAGNOSIS — R293 Abnormal posture: Secondary | ICD-10-CM | POA: Diagnosis not present

## 2016-02-29 DIAGNOSIS — R2681 Unsteadiness on feet: Secondary | ICD-10-CM | POA: Diagnosis not present

## 2016-02-29 DIAGNOSIS — M160 Bilateral primary osteoarthritis of hip: Secondary | ICD-10-CM | POA: Diagnosis not present

## 2016-02-29 DIAGNOSIS — R278 Other lack of coordination: Secondary | ICD-10-CM | POA: Diagnosis not present

## 2016-02-29 DIAGNOSIS — M6281 Muscle weakness (generalized): Secondary | ICD-10-CM | POA: Diagnosis not present

## 2016-02-29 DIAGNOSIS — R293 Abnormal posture: Secondary | ICD-10-CM | POA: Diagnosis not present

## 2016-02-29 DIAGNOSIS — R2689 Other abnormalities of gait and mobility: Secondary | ICD-10-CM | POA: Diagnosis not present

## 2016-03-01 ENCOUNTER — Other Ambulatory Visit: Payer: Self-pay | Admitting: *Deleted

## 2016-03-01 DIAGNOSIS — M5416 Radiculopathy, lumbar region: Secondary | ICD-10-CM | POA: Diagnosis not present

## 2016-03-01 DIAGNOSIS — M5137 Other intervertebral disc degeneration, lumbosacral region: Secondary | ICD-10-CM | POA: Diagnosis not present

## 2016-03-01 DIAGNOSIS — M5136 Other intervertebral disc degeneration, lumbar region: Secondary | ICD-10-CM | POA: Diagnosis not present

## 2016-03-01 MED ORDER — METOPROLOL SUCCINATE ER 25 MG PO TB24: 12.5000 mg | ORAL_TABLET | Freq: Two times a day (BID) | ORAL | 1 refills | Status: DC

## 2016-03-04 DIAGNOSIS — R2689 Other abnormalities of gait and mobility: Secondary | ICD-10-CM | POA: Diagnosis not present

## 2016-03-04 DIAGNOSIS — R278 Other lack of coordination: Secondary | ICD-10-CM | POA: Diagnosis not present

## 2016-03-04 DIAGNOSIS — M160 Bilateral primary osteoarthritis of hip: Secondary | ICD-10-CM | POA: Diagnosis not present

## 2016-03-04 DIAGNOSIS — M6281 Muscle weakness (generalized): Secondary | ICD-10-CM | POA: Diagnosis not present

## 2016-03-04 DIAGNOSIS — R2681 Unsteadiness on feet: Secondary | ICD-10-CM | POA: Diagnosis not present

## 2016-03-04 DIAGNOSIS — R293 Abnormal posture: Secondary | ICD-10-CM | POA: Diagnosis not present

## 2016-03-05 DIAGNOSIS — R2681 Unsteadiness on feet: Secondary | ICD-10-CM | POA: Diagnosis not present

## 2016-03-05 DIAGNOSIS — R278 Other lack of coordination: Secondary | ICD-10-CM | POA: Diagnosis not present

## 2016-03-05 DIAGNOSIS — M160 Bilateral primary osteoarthritis of hip: Secondary | ICD-10-CM | POA: Diagnosis not present

## 2016-03-05 DIAGNOSIS — M6281 Muscle weakness (generalized): Secondary | ICD-10-CM | POA: Diagnosis not present

## 2016-03-05 DIAGNOSIS — R2689 Other abnormalities of gait and mobility: Secondary | ICD-10-CM | POA: Diagnosis not present

## 2016-03-05 DIAGNOSIS — R293 Abnormal posture: Secondary | ICD-10-CM | POA: Diagnosis not present

## 2016-03-06 DIAGNOSIS — R2681 Unsteadiness on feet: Secondary | ICD-10-CM | POA: Diagnosis not present

## 2016-03-06 DIAGNOSIS — R278 Other lack of coordination: Secondary | ICD-10-CM | POA: Diagnosis not present

## 2016-03-06 DIAGNOSIS — R293 Abnormal posture: Secondary | ICD-10-CM | POA: Diagnosis not present

## 2016-03-06 DIAGNOSIS — R2689 Other abnormalities of gait and mobility: Secondary | ICD-10-CM | POA: Diagnosis not present

## 2016-03-06 DIAGNOSIS — M6281 Muscle weakness (generalized): Secondary | ICD-10-CM | POA: Diagnosis not present

## 2016-03-06 DIAGNOSIS — M160 Bilateral primary osteoarthritis of hip: Secondary | ICD-10-CM | POA: Diagnosis not present

## 2016-03-07 DIAGNOSIS — M6281 Muscle weakness (generalized): Secondary | ICD-10-CM | POA: Diagnosis not present

## 2016-03-07 DIAGNOSIS — R293 Abnormal posture: Secondary | ICD-10-CM | POA: Diagnosis not present

## 2016-03-07 DIAGNOSIS — M160 Bilateral primary osteoarthritis of hip: Secondary | ICD-10-CM | POA: Diagnosis not present

## 2016-03-07 DIAGNOSIS — R278 Other lack of coordination: Secondary | ICD-10-CM | POA: Diagnosis not present

## 2016-03-07 DIAGNOSIS — R2689 Other abnormalities of gait and mobility: Secondary | ICD-10-CM | POA: Diagnosis not present

## 2016-03-07 DIAGNOSIS — R2681 Unsteadiness on feet: Secondary | ICD-10-CM | POA: Diagnosis not present

## 2016-03-11 DIAGNOSIS — R2681 Unsteadiness on feet: Secondary | ICD-10-CM | POA: Diagnosis not present

## 2016-03-11 DIAGNOSIS — R293 Abnormal posture: Secondary | ICD-10-CM | POA: Diagnosis not present

## 2016-03-11 DIAGNOSIS — R2689 Other abnormalities of gait and mobility: Secondary | ICD-10-CM | POA: Diagnosis not present

## 2016-03-11 DIAGNOSIS — M6281 Muscle weakness (generalized): Secondary | ICD-10-CM | POA: Diagnosis not present

## 2016-03-11 DIAGNOSIS — R278 Other lack of coordination: Secondary | ICD-10-CM | POA: Diagnosis not present

## 2016-03-11 DIAGNOSIS — M160 Bilateral primary osteoarthritis of hip: Secondary | ICD-10-CM | POA: Diagnosis not present

## 2016-03-13 ENCOUNTER — Other Ambulatory Visit (INDEPENDENT_AMBULATORY_CARE_PROVIDER_SITE_OTHER): Payer: Medicare Other

## 2016-03-13 ENCOUNTER — Encounter: Payer: Self-pay | Admitting: Internal Medicine

## 2016-03-13 ENCOUNTER — Ambulatory Visit (INDEPENDENT_AMBULATORY_CARE_PROVIDER_SITE_OTHER): Payer: Medicare Other | Admitting: Internal Medicine

## 2016-03-13 DIAGNOSIS — E034 Atrophy of thyroid (acquired): Secondary | ICD-10-CM | POA: Diagnosis not present

## 2016-03-13 DIAGNOSIS — R413 Other amnesia: Secondary | ICD-10-CM

## 2016-03-13 DIAGNOSIS — I255 Ischemic cardiomyopathy: Secondary | ICD-10-CM

## 2016-03-13 DIAGNOSIS — I10 Essential (primary) hypertension: Secondary | ICD-10-CM

## 2016-03-13 DIAGNOSIS — M5440 Lumbago with sciatica, unspecified side: Secondary | ICD-10-CM

## 2016-03-13 DIAGNOSIS — E785 Hyperlipidemia, unspecified: Secondary | ICD-10-CM

## 2016-03-13 DIAGNOSIS — Z23 Encounter for immunization: Secondary | ICD-10-CM | POA: Diagnosis not present

## 2016-03-13 DIAGNOSIS — I5042 Chronic combined systolic (congestive) and diastolic (congestive) heart failure: Secondary | ICD-10-CM

## 2016-03-13 LAB — BASIC METABOLIC PANEL
BUN: 37 mg/dL — AB (ref 6–23)
CALCIUM: 8.5 mg/dL (ref 8.4–10.5)
CHLORIDE: 100 meq/L (ref 96–112)
CO2: 22 mEq/L (ref 19–32)
CREATININE: 2.19 mg/dL — AB (ref 0.40–1.50)
GFR: 30.09 mL/min — AB (ref >60.00)
Glucose, Bld: 104 mg/dL — ABNORMAL HIGH (ref 70–99)
Potassium: 4.2 mEq/L (ref 3.5–5.1)
Sodium: 133 mEq/L — ABNORMAL LOW (ref 135–145)

## 2016-03-13 MED ORDER — NITROGLYCERIN 0.4 MG SL SUBL: 0.4000 mg | SUBLINGUAL_TABLET | SUBLINGUAL | 1 refills | Status: DC | PRN

## 2016-03-13 NOTE — Progress Notes (Signed)
Pre visit review using our clinic review tool, if applicable. No additional management support is needed unless otherwise documented below in the visit note. 

## 2016-03-13 NOTE — Assessment & Plan Note (Signed)
On Toprol 

## 2016-03-13 NOTE — Assessment & Plan Note (Signed)
Metoprolol 

## 2016-03-13 NOTE — Assessment & Plan Note (Signed)
Doing fair 

## 2016-03-13 NOTE — Assessment & Plan Note (Signed)
On Lipitor 

## 2016-03-13 NOTE — Progress Notes (Signed)
Subjective:  Patient ID: EATHON EVELAND, male    DOB: [birth year]  Age: 82 y.o. MRN: [id number]  CC: No chief complaint on file.   HPI EULICES HUTCHERSON presents for LBP, memory loss, OA f/up  Outpatient Medications Prior to Visit  Medication Sig Dispense Refill  . acetaminophen (TYLENOL) 500 MG tablet Take 500 mg by mouth every 6 (six) hours as needed for headache.    . albuterol (PROVENTIL) (2.5 MG/3ML) 0.083% nebulizer solution USE ONE VIAL VIA NEBULIZER FOUR TIMES DAILY AS NEEDED. 375 mL 11  . AMBULATORY NON FORMULARY MEDICATION Diltiazem gel 2% Apply small amount pea-sized to anal area 2-3 times a day for anal stenosis. Use as needed. 30 g 5  . Artificial Tear Ointment (DRY EYES OP) Apply 1 drop to eye daily.    Marland Kitchen atorvastatin (LIPITOR) 20 MG tablet Take 0.5 tablets (10 mg total) by mouth 2 (two) times daily. 90 tablet 3  . diltiazem 2 % GEL Apply 1 application topically 3 (three) times daily. 30 g 11  . diphenoxylate-atropine (LOMOTIL) 2.5-0.025 MG tablet Take 1 tablet by mouth 4 (four) times daily as needed for diarrhea or loose stools. 60 tablet 1  . donepezil (ARICEPT) 10 MG tablet Take 1 tablet (10 mg total) by mouth at bedtime. 90 tablet 3  . fluticasone (FLONASE) 50 MCG/ACT nasal spray Place 2 sprays into both nostrils daily. 48 g 3  . folic acid (FOLVITE) Q000111Q MCG tablet Take 400 mcg by mouth daily.     Marland Kitchen levothyroxine (SYNTHROID, LEVOTHROID) 112 MCG tablet Take 1 tablet (112 mcg total) by mouth daily. 30 tablet 11  . metoprolol succinate (TOPROL-XL) 25 MG 24 hr tablet Take 0.5 tablets (12.5 mg total) by mouth 2 (two) times daily. 90 tablet 1  . multivitamin-iron-minerals-folic acid (CENTRUM) chewable tablet Chew 1 tablet by mouth daily.    . nitroGLYCERIN (NITROSTAT) 0.4 MG SL tablet Place 1 tablet (0.4 mg total) under the tongue every 5 (five) minutes as needed. Chest pain 20 tablet 2  . pantoprazole (PROTONIX) 40 MG tablet Take 1 tablet (40 mg total) by mouth daily. 90  tablet 3  . triamcinolone (NASACORT AQ) 55 MCG/ACT AERO nasal inhaler Place 2 sprays into the nose daily. 3 Inhaler 3   No facility-administered medications prior to visit.     ROS Review of Systems  Constitutional: Negative for appetite change, fatigue and unexpected weight change.  HENT: Negative for congestion, nosebleeds, sneezing, sore throat and trouble swallowing.   Eyes: Negative for itching and visual disturbance.  Respiratory: Negative for cough.   Cardiovascular: Negative for chest pain, palpitations and leg swelling.  Gastrointestinal: Negative for abdominal distention, blood in stool, diarrhea and nausea.  Genitourinary: Negative for frequency and hematuria.  Musculoskeletal: Negative for back pain, gait problem, joint swelling and neck pain.  Skin: Negative for rash.  Neurological: Negative for dizziness, tremors, speech difficulty and weakness.  Psychiatric/Behavioral: Negative for agitation, dysphoric mood and sleep disturbance. The patient is not nervous/anxious.     Objective:  BP (!) 98/58   Pulse 83   Temp 98 F (36.7 C) (Oral)   Wt 157 lb (71.2 kg)   SpO2 95%   BMI 23.87 kg/m   BP Readings from Last 3 Encounters:  03/13/16 (!) 98/58  12/20/15 118/60  12/18/15 (!) 150/65    Wt Readings from Last 3 Encounters:  03/13/16 157 lb (71.2 kg)  12/20/15 154 lb (69.9 kg)  10/23/15 151 lb (68.5 kg)  Physical Exam  Constitutional: He is oriented to person, place, and time. He appears well-developed. No distress.  NAD  HENT:  Mouth/Throat: Oropharynx is clear and moist.  Eyes: Conjunctivae are normal. Pupils are equal, round, and reactive to light.  Neck: Normal range of motion. No JVD present. No thyromegaly present.  Cardiovascular: Normal rate, regular rhythm, normal heart sounds and intact distal pulses.  Exam reveals no gallop and no friction rub.   No murmur heard. Pulmonary/Chest: Effort normal and breath sounds normal. No respiratory distress. He  has no wheezes. He has no rales. He exhibits no tenderness.  Abdominal: Soft. Bowel sounds are normal. He exhibits no distension and no mass. There is no tenderness. There is no rebound and no guarding.  Musculoskeletal: Normal range of motion. He exhibits no edema or tenderness.  Lymphadenopathy:    He has no cervical adenopathy.  Neurological: He is alert and oriented to person, place, and time. He has normal reflexes. No cranial nerve deficit. He exhibits normal muscle tone. He displays a negative Romberg sign. Coordination and gait normal.  Skin: Skin is warm and dry. No rash noted.  Psychiatric: He has a normal mood and affect. His behavior is normal. Judgment and thought content normal.    Lab Results  Component Value Date   WBC 8.4 12/18/2015   HGB 12.7 (L) 12/18/2015   HCT 38.3 (L) 12/18/2015   PLT 152 12/18/2015   GLUCOSE 89 12/18/2015   CHOL 110 05/25/2013   TRIG 67.0 05/25/2013   HDL 40.30 05/25/2013   LDLCALC 56 05/25/2013   ALT 53 12/18/2015   AST 40 (H) 12/18/2015   NA 136 12/18/2015   K 4.2 12/18/2015   CL 101 10/23/2015   CREATININE 1.5 (H) 12/18/2015   BUN 33.7 (H) 12/18/2015   CO2 22 12/18/2015   TSH 6.20 (H) 06/07/2015   PSA 3.42 11/28/2011   INR 1.15 10/12/2015    Nm Bone Scan 3 Phase  Result Date: 11/03/2015 CLINICAL DATA:  Left hip and left femur pain. EXAM: NUCLEAR MEDICINE 3-PHASE BONE SCAN TECHNIQUE: Radionuclide angiographic images, immediate static blood pool images, and 3-hour delayed static images were obtained of the hips after intravenous injection of radiopharmaceutical. RADIOPHARMACEUTICALS:  27.0 mCi Tc-48m MDP COMPARISON:  07/27/2015 FINDINGS: Vascular phase: There is no abnormal asymmetric increased uptake identified. Blood pool phase: No abnormal asymmetric increased radiotracer uptake identified. Delayed phase: There is asymmetric increased radiotracer activity localizing to the greater trochanter of the right proximal femur. IMPRESSION: 1. No  findings to explain patient's left femur pain. 2. Mild asymmetric increased uptake localizes to the greater trochanter of the right proximal femur. Electronically Signed   By: Kerby Moors M.D.   On: 11/03/2015 17:02    Assessment & Plan:   There are no diagnoses linked to this encounter. I am having Mr. Whitlock maintain his folic acid, fluticasone, nitroGLYCERIN, acetaminophen, Artificial Tear Ointment (DRY EYES OP), AMBULATORY NON FORMULARY MEDICATION, pantoprazole, diphenoxylate-atropine, albuterol, levothyroxine, diltiazem, multivitamin-iron-minerals-folic acid, atorvastatin, donepezil, triamcinolone, and metoprolol succinate.  No orders of the defined types were placed in this encounter.    Follow-up: No Follow-up on file.  Walker Kehr, MD

## 2016-03-13 NOTE — Assessment & Plan Note (Signed)
On Levothroid 

## 2016-03-13 NOTE — Assessment & Plan Note (Signed)
Doing well 

## 2016-03-14 DIAGNOSIS — M160 Bilateral primary osteoarthritis of hip: Secondary | ICD-10-CM | POA: Diagnosis not present

## 2016-03-14 DIAGNOSIS — R278 Other lack of coordination: Secondary | ICD-10-CM | POA: Diagnosis not present

## 2016-03-14 DIAGNOSIS — M6281 Muscle weakness (generalized): Secondary | ICD-10-CM | POA: Diagnosis not present

## 2016-03-14 DIAGNOSIS — R2689 Other abnormalities of gait and mobility: Secondary | ICD-10-CM | POA: Diagnosis not present

## 2016-03-14 DIAGNOSIS — R293 Abnormal posture: Secondary | ICD-10-CM | POA: Diagnosis not present

## 2016-03-14 DIAGNOSIS — R2681 Unsteadiness on feet: Secondary | ICD-10-CM | POA: Diagnosis not present

## 2016-03-15 DIAGNOSIS — M160 Bilateral primary osteoarthritis of hip: Secondary | ICD-10-CM | POA: Diagnosis not present

## 2016-03-15 DIAGNOSIS — R278 Other lack of coordination: Secondary | ICD-10-CM | POA: Diagnosis not present

## 2016-03-15 DIAGNOSIS — M6281 Muscle weakness (generalized): Secondary | ICD-10-CM | POA: Diagnosis not present

## 2016-03-15 DIAGNOSIS — R293 Abnormal posture: Secondary | ICD-10-CM | POA: Diagnosis not present

## 2016-03-15 DIAGNOSIS — R2689 Other abnormalities of gait and mobility: Secondary | ICD-10-CM | POA: Diagnosis not present

## 2016-03-15 DIAGNOSIS — R2681 Unsteadiness on feet: Secondary | ICD-10-CM | POA: Diagnosis not present

## 2016-03-17 ENCOUNTER — Encounter (HOSPITAL_COMMUNITY): Payer: Self-pay | Admitting: Emergency Medicine

## 2016-03-17 ENCOUNTER — Emergency Department (HOSPITAL_COMMUNITY): Payer: Medicare Other

## 2016-03-17 ENCOUNTER — Emergency Department (HOSPITAL_COMMUNITY)
Admission: EM | Admit: 2016-03-17 | Discharge: 2016-03-17 | Disposition: A | Discharge status: Home / Self Care | Payer: Medicare Other | Attending: Emergency Medicine | Admitting: Emergency Medicine

## 2016-03-17 DIAGNOSIS — N183 Chronic kidney disease, stage 3 (moderate): Secondary | ICD-10-CM | POA: Insufficient documentation

## 2016-03-17 DIAGNOSIS — R339 Retention of urine, unspecified: Secondary | ICD-10-CM | POA: Diagnosis not present

## 2016-03-17 DIAGNOSIS — Z85028 Personal history of other malignant neoplasm of stomach: Secondary | ICD-10-CM | POA: Diagnosis not present

## 2016-03-17 DIAGNOSIS — Z950 Presence of cardiac pacemaker: Secondary | ICD-10-CM | POA: Diagnosis not present

## 2016-03-17 DIAGNOSIS — I5042 Chronic combined systolic (congestive) and diastolic (congestive) heart failure: Secondary | ICD-10-CM | POA: Diagnosis not present

## 2016-03-17 DIAGNOSIS — M545 Low back pain: Secondary | ICD-10-CM | POA: Diagnosis not present

## 2016-03-17 DIAGNOSIS — R338 Other retention of urine: Secondary | ICD-10-CM

## 2016-03-17 DIAGNOSIS — Z87891 Personal history of nicotine dependence: Secondary | ICD-10-CM | POA: Diagnosis not present

## 2016-03-17 DIAGNOSIS — I130 Hypertensive heart and chronic kidney disease with heart failure and stage 1 through stage 4 chronic kidney disease, or unspecified chronic kidney disease: Secondary | ICD-10-CM | POA: Insufficient documentation

## 2016-03-17 DIAGNOSIS — E039 Hypothyroidism, unspecified: Secondary | ICD-10-CM | POA: Diagnosis not present

## 2016-03-17 DIAGNOSIS — R300 Dysuria: Secondary | ICD-10-CM | POA: Diagnosis present

## 2016-03-17 LAB — COMPREHENSIVE METABOLIC PANEL
ALBUMIN: 3.4 g/dL — AB (ref 3.5–5.0)
ALT: 24 U/L (ref 17–63)
AST: 28 U/L (ref 15–41)
Alkaline Phosphatase: 206 U/L — ABNORMAL HIGH (ref 38–126)
Anion gap: 6 (ref 5–15)
BUN: 27 mg/dL — AB (ref 6–20)
CHLORIDE: 102 mmol/L (ref 101–111)
CO2: 22 mmol/L (ref 22–32)
CREATININE: 1.54 mg/dL — AB (ref 0.61–1.24)
Calcium: 8.8 mg/dL — ABNORMAL LOW (ref 8.9–10.3)
GFR calc Af Amer: 44 mL/min — ABNORMAL LOW (ref >60)
GFR, EST NON AFRICAN AMERICAN: 38 mL/min — AB (ref >60)
GLUCOSE: 102 mg/dL — AB (ref 65–99)
POTASSIUM: 4.2 mmol/L (ref 3.5–5.1)
Sodium: 130 mmol/L — ABNORMAL LOW (ref 135–145)
Total Bilirubin: 1 mg/dL (ref 0.3–1.2)
Total Protein: 6.4 g/dL — ABNORMAL LOW (ref 6.5–8.1)

## 2016-03-17 LAB — CBC
HEMATOCRIT: 33.7 % — AB (ref 39.0–52.0)
Hemoglobin: 11.8 g/dL — ABNORMAL LOW (ref 13.0–17.0)
MCH: 31.8 pg (ref 26.0–34.0)
MCHC: 35 g/dL (ref 30.0–36.0)
MCV: 90.8 fL (ref 78.0–100.0)
PLATELETS: 141 10*3/uL — AB (ref 150–400)
RBC: 3.71 MIL/uL — ABNORMAL LOW (ref 4.22–5.81)
RDW: 13.7 % (ref 11.5–15.5)
WBC: 8.9 10*3/uL (ref 4.0–10.5)

## 2016-03-17 LAB — I-STAT CG4 LACTIC ACID, ED: Lactic Acid, Venous: 1.02 mmol/L (ref 0.5–1.9)

## 2016-03-17 LAB — URINALYSIS, ROUTINE W REFLEX MICROSCOPIC
Bilirubin Urine: NEGATIVE
GLUCOSE, UA: NEGATIVE mg/dL
HGB URINE DIPSTICK: NEGATIVE
KETONES UR: NEGATIVE mg/dL
Leukocytes, UA: NEGATIVE
Nitrite: NEGATIVE
PH: 5 (ref 5.0–8.0)
PROTEIN: NEGATIVE mg/dL
Specific Gravity, Urine: 1.006 (ref 1.005–1.030)

## 2016-03-17 NOTE — ED Provider Notes (Addendum)
[facility] DEPT Provider Note   CSN: [id number] Arrival date & time: 03/17/16  1025     History   Chief Complaint Chief Complaint  Patient presents with  . Back Pain  . Dysuria    HPI [name] [name] is a 82 y.o. male with a history of stomach cancer, CHF, collagen vascular disease, hypertension and COPD. He presents emergency Department with chief complaint of urinary frequency. Patient states that last night he was up every hour urinating. He has a history of previous urinary tract infections. His daughters state that 5 days ago he had chills and felt warm. They treated him with Tylenol and his symptoms improved. He saw his primary care physician the next day and there was no abnormalities on his workup. Last night he started having lower back pain and urinary frequency. He states he normally urinates about every 4 hours at night. He has associated malaise but denies chills, fever or fatigue. He has no abdominal pain, nausea, vomiting, chest pain or shortness of breath, but abnormal for him. His chronic lower back pain but states that it was worse last night with his associated urinary frequency. He has a history of one episode of a kidney stone that he was unaware of until he passed. [place]. About 30 years ago.  HPI  Past Medical History:  Diagnosis Date  . Cancer ([place])    Stomach  . CHF (congestive heart failure) ([place])   . Collagen vascular disease ([place])   . Hypertension     Patient Active Problem List   Diagnosis Date Noted  . Preventive measure 12/18/2015  . Elevated alkaline phosphatase level 12/18/2015  . Rash, skin 08/26/2015  . Diarrhea 05/19/2015  . UTI (urinary tract infection) 05/19/2015  . Monocytosis 05/12/2015  . Urinary tract infection, site not specified 04/06/2015  . Stomach pain 04/06/2015  . Cough 03/28/2015  . Pressure ulcer 03/13/2015  . Dyslipidemia 03/12/2015  . Leukocytosis 03/12/2015  . Chronic combined systolic and diastolic CHF  (congestive heart failure) ([place]) 03/12/2015  . CKD (chronic kidney disease) stage 3, GFR 30-59 ml/min 03/12/2015  . Acute respiratory failure with hypoxemia ([place]) 03/12/2015  . Right femoral fracture, closed, initial encounter 03/12/2015  . Fracture of proximal end of right femur ([place]) 03/11/2015  . History of B-cell lymphoma 10/27/2014  . Thrombocytopenia ([place]) 10/26/2014  . Anemia in chronic renal disease 06/18/2014  . Automatic implantable cardioverter-defibrillator in situ 12/30/2008  . Memory loss 06/21/2008  . LOW BACK PAIN 06/15/2007  . Hypothyroidism 01/08/2007  . Essential hypertension 01/08/2007    Past Surgical History:  Procedure Laterality Date  . APPENDECTOMY    . COLONOSCOPY  12/1998, 02/2004   diverticulosois, external hemorrhoids  . CRANIOTOMY Left   . EP IMPLANTABLE DEVICE N/A 06/12/2015   Procedure: Pacemaker Implant ;  Surgeon: Evans Lance, MD;  Location: [facility];  Service: Cardiovascular;  Laterality: N/A;  . ESOPHAGOGASTRODUODENOSCOPY  06/17/2014   Dr. Oneida Alar: 1. Patent stricture at the gastroesophageal junction 2. UGIB Due to multiple  gastric ulcers 3. Moderate Duodentitis. Negative H.pylori  . ESOPHAGOGASTRODUODENOSCOPY N/A 10/17/2014   Procedure: ESOPHAGOGASTRODUODENOSCOPY (EGD);  Surgeon: Danie Binder, MD;  [facility];  Service: Endoscopy;  Laterality: N/A;  1030  . ESOPHAGOGASTRODUODENOSCOPY N/A 02/10/2015   Procedure: ESOPHAGOGASTRODUODENOSCOPY (EGD);  Surgeon: Danie Binder, MD;  [facility];  Service: Endoscopy;  Laterality: N/A;  115  . heart disease     permanent pacemaker  . HIP FRACTURE SURGERY  2011   ORIF  R.  . INGUINAL HERNIA REPAIR    . IR GENERIC HISTORICAL  10/26/2015   IR RADIOLOGIST EVAL & MGMT 10/26/2015 [facility]  . PACEMAKER INSERTION         Home Medications    Prior to Admission medications   Medication Sig Start Date End Date Taking? Authorizing Provider  acetaminophen (TYLENOL) 500  MG tablet Take 500 mg by mouth every 6 (six) hours as needed for headache.    Historical Provider, MD  albuterol (PROVENTIL) (2.5 MG/3ML) 0.083% nebulizer solution USE ONE VIAL VIA NEBULIZER FOUR TIMES DAILY AS NEEDED. 10/23/15   Evie Lacks Plotnikov, MD  AMBULATORY NON FORMULARY MEDICATION Diltiazem gel 2% Apply small amount pea-sized to anal area 2-3 times a day for anal stenosis. Use as needed. 06/07/15   Evie Lacks Plotnikov, MD  Artificial Tear Ointment (DRY EYES OP) Apply 1 drop to eye daily.    Historical Provider, MD  atorvastatin (LIPITOR) 20 MG tablet Take 0.5 tablets (10 mg total) by mouth 2 (two) times daily. 12/20/15   Evie Lacks Plotnikov, MD  diltiazem 2 % GEL Apply 1 application topically 3 (three) times daily. 10/23/15   Evie Lacks Plotnikov, MD  diphenoxylate-atropine (LOMOTIL) 2.5-0.025 MG tablet Take 1 tablet by mouth 4 (four) times daily as needed for diarrhea or loose stools. 06/07/15   Evie Lacks Plotnikov, MD  donepezil (ARICEPT) 10 MG tablet Take 1 tablet (10 mg total) by mouth at bedtime. 12/20/15 12/19/16  Evie Lacks Plotnikov, MD  fluticasone (FLONASE) 50 MCG/ACT nasal spray Place 2 sprays into both nostrils daily. 05/26/14   Evie Lacks Plotnikov, MD  folic acid (FOLVITE) Q000111Q MCG tablet Take 400 mcg by mouth daily.     Historical Provider, MD  levothyroxine (SYNTHROID, LEVOTHROID) 112 MCG tablet Take 1 tablet (112 mcg total) by mouth daily. 10/23/15   Evie Lacks Plotnikov, MD  metoprolol succinate (TOPROL-XL) 25 MG 24 hr tablet Take 0.5 tablets (12.5 mg total) by mouth 2 (two) times daily. 03/01/16   Cassandria Anger, MD  multivitamin-iron-minerals-folic acid (CENTRUM) chewable tablet Chew 1 tablet by mouth daily.    Historical Provider, MD  nitroGLYCERIN (NITROSTAT) 0.4 MG SL tablet Place 1 tablet (0.4 mg total) under the tongue every 5 (five) minutes as needed. Chest pain 03/13/16 03/12/18  Evie Lacks Plotnikov, MD  pantoprazole (PROTONIX) 40 MG tablet Take 1 tablet (40 mg total) by mouth  daily. 06/07/15   Evie Lacks Plotnikov, MD  triamcinolone (NASACORT AQ) 55 MCG/ACT AERO nasal inhaler Place 2 sprays into the nose daily. 12/20/15   Cassandria Anger, MD    Family History Family History  Problem Relation Age of Onset  . Cancer Sister   . Coronary artery disease      male 1st degree relative<60  . Hypertension      Social History Social History  Substance Use Topics  . Smoking status: Former Smoker    Packs/day: 1.00    Years: 30.00    Quit date: 10/26/1976  . Smokeless tobacco: Never Used  . Alcohol use No     Allergies   Aleve [naproxen sodium]; Ibuprofen; and Tramadol   Review of Systems Review of Systems Ten systems reviewed and are negative for acute change, except as noted in the HPI.    Physical Exam Updated Vital Signs BP 126/65 (BP Location: Right Arm)   Pulse 62   Temp 98.1 F (36.7 C) (Oral)   Resp 18   SpO2 98%  Physical Exam  Constitutional: He appears well-developed and well-nourished. No distress.  HENT:  Head: Normocephalic and atraumatic.  Eyes: Conjunctivae are normal. No scleral icterus.  Neck: Normal range of motion. Neck supple.  Cardiovascular: Normal rate, regular rhythm and normal heart sounds.   Pulmonary/Chest: Effort normal and breath sounds normal. No respiratory distress.  Abdominal: Soft. He exhibits distension. There is no tenderness.  Firmly distended mass up to umbilicus, suspect bladder  Musculoskeletal: He exhibits no edema.  No back pain or tenderness  Neurological: He is alert.  Skin: Skin is warm and dry. He is not diaphoretic.  Psychiatric: His behavior is normal.  Nursing note and vitals reviewed.    ED Treatments / Results  Labs (all labs ordered are listed, but only abnormal results are displayed) Labs Reviewed  COMPREHENSIVE METABOLIC PANEL - Abnormal; Notable for the following:       Result Value   Sodium 130 (*)    Glucose, Bld 102 (*)    BUN 27 (*)    Creatinine, Ser 1.54 (*)     Calcium 8.8 (*)    Total Protein 6.4 (*)    Albumin 3.4 (*)    Alkaline Phosphatase 206 (*)    GFR calc non Af Amer 38 (*)    GFR calc Af Amer 44 (*)    All other components within normal limits  CBC - Abnormal; Notable for the following:    RBC 3.71 (*)    Hemoglobin 11.8 (*)    HCT 33.7 (*)    Platelets 141 (*)    All other components within normal limits  URINALYSIS, ROUTINE W REFLEX MICROSCOPIC  I-STAT CG4 LACTIC ACID, ED    EKG  EKG Interpretation None       Radiology No results found.  Procedures Procedures (including critical care time)  Medications Ordered in ED Medications - No data to display   Initial Impression / Assessment and Plan / ED Course  I have reviewed the triage vital signs and the nursing notes.  Pertinent labs & imaging results that were available during my care of the patient were reviewed by me and considered in my medical decision making (see chart for details).  Clinical Course     Patient lab reviewed and without significant abnormality. No new signs of urinary tract infection. X-ray of the lumbar spine without acute compression fracture. Patient's abdomen feels mildly distended but nontender in the lower abdomen. A bladder scan shows almost 875 ML of fluid retention. Patient was able to urinate about 200 and had a postvoid residual urine of about 600. Patient given a Foley catheter and put out 600 immediately. His discomfort is greatly improved. The patient will be discharged with Foley catheter. He was given instructions in care with bleach back method by the patient. He is able to ambulate with a walker with his leg bag present. I have given him care instructions, was Foley catheter and he is to follow-up on Tuesday with [facility]. I discussed return precautions with the patient and his family who understand and agree with plan of care.  Final Clinical Impressions(s) / ED Diagnoses   Final diagnoses:  None    New  Prescriptions New Prescriptions   No medications on file     Margarita Mail, PA-C 03/17/16 [place], MD 03/18/16 [place], PA-C 04/18/16 2041    Gwenyth Allegra Tegeler, MD 04/21/16 1105

## 2016-03-17 NOTE — ED Triage Notes (Signed)
Pt here for back pain and dysuria starting yesterday; pt with fever earlier in week

## 2016-03-17 NOTE — Discharge Instructions (Signed)
Get help right away if: You develop chills or fever. Your catheter stops draining urine. Your catheter falls out. You start to develop increased bleeding that does not respond to rest and increased fluid intake.

## 2016-03-19 DIAGNOSIS — R2689 Other abnormalities of gait and mobility: Secondary | ICD-10-CM | POA: Diagnosis not present

## 2016-03-19 DIAGNOSIS — R293 Abnormal posture: Secondary | ICD-10-CM | POA: Diagnosis not present

## 2016-03-19 DIAGNOSIS — R2681 Unsteadiness on feet: Secondary | ICD-10-CM | POA: Diagnosis not present

## 2016-03-19 DIAGNOSIS — R278 Other lack of coordination: Secondary | ICD-10-CM | POA: Diagnosis not present

## 2016-03-19 DIAGNOSIS — R339 Retention of urine, unspecified: Secondary | ICD-10-CM | POA: Diagnosis not present

## 2016-03-19 DIAGNOSIS — M6281 Muscle weakness (generalized): Secondary | ICD-10-CM | POA: Diagnosis not present

## 2016-03-19 DIAGNOSIS — M160 Bilateral primary osteoarthritis of hip: Secondary | ICD-10-CM | POA: Diagnosis not present

## 2016-03-20 DIAGNOSIS — M6281 Muscle weakness (generalized): Secondary | ICD-10-CM | POA: Diagnosis not present

## 2016-03-20 DIAGNOSIS — R2681 Unsteadiness on feet: Secondary | ICD-10-CM | POA: Diagnosis not present

## 2016-03-20 DIAGNOSIS — R293 Abnormal posture: Secondary | ICD-10-CM | POA: Diagnosis not present

## 2016-03-20 DIAGNOSIS — M160 Bilateral primary osteoarthritis of hip: Secondary | ICD-10-CM | POA: Diagnosis not present

## 2016-03-20 DIAGNOSIS — R2689 Other abnormalities of gait and mobility: Secondary | ICD-10-CM | POA: Diagnosis not present

## 2016-03-20 DIAGNOSIS — R278 Other lack of coordination: Secondary | ICD-10-CM | POA: Diagnosis not present

## 2016-03-21 ENCOUNTER — Ambulatory Visit (INDEPENDENT_AMBULATORY_CARE_PROVIDER_SITE_OTHER): Payer: Medicare Other | Admitting: *Deleted

## 2016-03-21 DIAGNOSIS — R2689 Other abnormalities of gait and mobility: Secondary | ICD-10-CM | POA: Diagnosis not present

## 2016-03-21 DIAGNOSIS — R278 Other lack of coordination: Secondary | ICD-10-CM | POA: Diagnosis not present

## 2016-03-21 DIAGNOSIS — I495 Sick sinus syndrome: Secondary | ICD-10-CM | POA: Diagnosis not present

## 2016-03-21 DIAGNOSIS — M6281 Muscle weakness (generalized): Secondary | ICD-10-CM | POA: Diagnosis not present

## 2016-03-21 DIAGNOSIS — R2681 Unsteadiness on feet: Secondary | ICD-10-CM | POA: Diagnosis not present

## 2016-03-21 DIAGNOSIS — R293 Abnormal posture: Secondary | ICD-10-CM | POA: Diagnosis not present

## 2016-03-21 DIAGNOSIS — M160 Bilateral primary osteoarthritis of hip: Secondary | ICD-10-CM | POA: Diagnosis not present

## 2016-03-21 NOTE — Progress Notes (Signed)
Remote pacemaker transmission.   

## 2016-03-22 ENCOUNTER — Encounter: Payer: Self-pay | Admitting: Cardiology

## 2016-03-22 DIAGNOSIS — R2689 Other abnormalities of gait and mobility: Secondary | ICD-10-CM | POA: Diagnosis not present

## 2016-03-22 DIAGNOSIS — M160 Bilateral primary osteoarthritis of hip: Secondary | ICD-10-CM | POA: Diagnosis not present

## 2016-03-22 DIAGNOSIS — M6281 Muscle weakness (generalized): Secondary | ICD-10-CM | POA: Diagnosis not present

## 2016-03-22 DIAGNOSIS — R293 Abnormal posture: Secondary | ICD-10-CM | POA: Diagnosis not present

## 2016-03-22 DIAGNOSIS — R2681 Unsteadiness on feet: Secondary | ICD-10-CM | POA: Diagnosis not present

## 2016-03-22 DIAGNOSIS — R278 Other lack of coordination: Secondary | ICD-10-CM | POA: Diagnosis not present

## 2016-03-26 DIAGNOSIS — R2689 Other abnormalities of gait and mobility: Secondary | ICD-10-CM | POA: Diagnosis not present

## 2016-03-26 DIAGNOSIS — R29898 Other symptoms and signs involving the musculoskeletal system: Secondary | ICD-10-CM | POA: Diagnosis not present

## 2016-03-26 DIAGNOSIS — R293 Abnormal posture: Secondary | ICD-10-CM | POA: Diagnosis not present

## 2016-03-26 DIAGNOSIS — R278 Other lack of coordination: Secondary | ICD-10-CM | POA: Diagnosis not present

## 2016-03-26 DIAGNOSIS — R2681 Unsteadiness on feet: Secondary | ICD-10-CM | POA: Diagnosis not present

## 2016-03-26 DIAGNOSIS — M160 Bilateral primary osteoarthritis of hip: Secondary | ICD-10-CM | POA: Diagnosis not present

## 2016-03-26 DIAGNOSIS — M6281 Muscle weakness (generalized): Secondary | ICD-10-CM | POA: Diagnosis not present

## 2016-03-28 DIAGNOSIS — R278 Other lack of coordination: Secondary | ICD-10-CM | POA: Diagnosis not present

## 2016-03-28 DIAGNOSIS — R293 Abnormal posture: Secondary | ICD-10-CM | POA: Diagnosis not present

## 2016-03-28 DIAGNOSIS — M6281 Muscle weakness (generalized): Secondary | ICD-10-CM | POA: Diagnosis not present

## 2016-03-28 DIAGNOSIS — R2681 Unsteadiness on feet: Secondary | ICD-10-CM | POA: Diagnosis not present

## 2016-03-28 DIAGNOSIS — M160 Bilateral primary osteoarthritis of hip: Secondary | ICD-10-CM | POA: Diagnosis not present

## 2016-03-28 DIAGNOSIS — R2689 Other abnormalities of gait and mobility: Secondary | ICD-10-CM | POA: Diagnosis not present

## 2016-03-29 DIAGNOSIS — R293 Abnormal posture: Secondary | ICD-10-CM | POA: Diagnosis not present

## 2016-03-29 DIAGNOSIS — M6281 Muscle weakness (generalized): Secondary | ICD-10-CM | POA: Diagnosis not present

## 2016-03-29 DIAGNOSIS — M160 Bilateral primary osteoarthritis of hip: Secondary | ICD-10-CM | POA: Diagnosis not present

## 2016-03-29 DIAGNOSIS — R2681 Unsteadiness on feet: Secondary | ICD-10-CM | POA: Diagnosis not present

## 2016-03-29 DIAGNOSIS — R2689 Other abnormalities of gait and mobility: Secondary | ICD-10-CM | POA: Diagnosis not present

## 2016-03-29 DIAGNOSIS — R278 Other lack of coordination: Secondary | ICD-10-CM | POA: Diagnosis not present

## 2016-03-29 LAB — CUP PACEART REMOTE DEVICE CHECK
Battery Impedance: 100 Ohm
Brady Statistic AS VS Percent: 4 %
Date Time Interrogation Session: 20171228141709
Implantable Lead Implant Date: 20031204
Implantable Lead Location: 753859
Implantable Lead Model: 5076
Implantable Pulse Generator Implant Date: 20170320
Lead Channel Impedance Value: 456 Ohm
Lead Channel Setting Pacing Amplitude: 2.5 V
Lead Channel Setting Pacing Pulse Width: 0.4 ms
Lead Channel Setting Sensing Sensitivity: 5.6 mV
MDC IDC LEAD IMPLANT DT: 20031204
MDC IDC LEAD LOCATION: 753860
MDC IDC MSMT BATTERY REMAINING LONGEVITY: 144 mo
MDC IDC MSMT BATTERY VOLTAGE: 2.79 V
MDC IDC MSMT LEADCHNL RA IMPEDANCE VALUE: 506 Ohm
MDC IDC MSMT LEADCHNL RA PACING THRESHOLD AMPLITUDE: 0.625 V
MDC IDC MSMT LEADCHNL RA PACING THRESHOLD PULSEWIDTH: 0.4 ms
MDC IDC MSMT LEADCHNL RV PACING THRESHOLD AMPLITUDE: 1 V
MDC IDC MSMT LEADCHNL RV PACING THRESHOLD PULSEWIDTH: 0.4 ms
MDC IDC SET LEADCHNL RA PACING AMPLITUDE: 2 V
MDC IDC STAT BRADY AP VP PERCENT: 2 %
MDC IDC STAT BRADY AP VS PERCENT: 94 %
MDC IDC STAT BRADY AS VP PERCENT: 0 %

## 2016-04-01 DIAGNOSIS — R293 Abnormal posture: Secondary | ICD-10-CM | POA: Diagnosis not present

## 2016-04-01 DIAGNOSIS — M6281 Muscle weakness (generalized): Secondary | ICD-10-CM | POA: Diagnosis not present

## 2016-04-01 DIAGNOSIS — R2681 Unsteadiness on feet: Secondary | ICD-10-CM | POA: Diagnosis not present

## 2016-04-01 DIAGNOSIS — M160 Bilateral primary osteoarthritis of hip: Secondary | ICD-10-CM | POA: Diagnosis not present

## 2016-04-01 DIAGNOSIS — R278 Other lack of coordination: Secondary | ICD-10-CM | POA: Diagnosis not present

## 2016-04-01 DIAGNOSIS — R2689 Other abnormalities of gait and mobility: Secondary | ICD-10-CM | POA: Diagnosis not present

## 2016-04-02 DIAGNOSIS — R293 Abnormal posture: Secondary | ICD-10-CM | POA: Diagnosis not present

## 2016-04-02 DIAGNOSIS — M160 Bilateral primary osteoarthritis of hip: Secondary | ICD-10-CM | POA: Diagnosis not present

## 2016-04-02 DIAGNOSIS — R2689 Other abnormalities of gait and mobility: Secondary | ICD-10-CM | POA: Diagnosis not present

## 2016-04-02 DIAGNOSIS — R2681 Unsteadiness on feet: Secondary | ICD-10-CM | POA: Diagnosis not present

## 2016-04-02 DIAGNOSIS — R278 Other lack of coordination: Secondary | ICD-10-CM | POA: Diagnosis not present

## 2016-04-02 DIAGNOSIS — M6281 Muscle weakness (generalized): Secondary | ICD-10-CM | POA: Diagnosis not present

## 2016-04-03 DIAGNOSIS — R2681 Unsteadiness on feet: Secondary | ICD-10-CM | POA: Diagnosis not present

## 2016-04-03 DIAGNOSIS — M160 Bilateral primary osteoarthritis of hip: Secondary | ICD-10-CM | POA: Diagnosis not present

## 2016-04-03 DIAGNOSIS — M6281 Muscle weakness (generalized): Secondary | ICD-10-CM | POA: Diagnosis not present

## 2016-04-03 DIAGNOSIS — R2689 Other abnormalities of gait and mobility: Secondary | ICD-10-CM | POA: Diagnosis not present

## 2016-04-03 DIAGNOSIS — R278 Other lack of coordination: Secondary | ICD-10-CM | POA: Diagnosis not present

## 2016-04-03 DIAGNOSIS — R293 Abnormal posture: Secondary | ICD-10-CM | POA: Diagnosis not present

## 2016-04-04 DIAGNOSIS — R2681 Unsteadiness on feet: Secondary | ICD-10-CM | POA: Diagnosis not present

## 2016-04-04 DIAGNOSIS — R2689 Other abnormalities of gait and mobility: Secondary | ICD-10-CM | POA: Diagnosis not present

## 2016-04-04 DIAGNOSIS — R278 Other lack of coordination: Secondary | ICD-10-CM | POA: Diagnosis not present

## 2016-04-04 DIAGNOSIS — M160 Bilateral primary osteoarthritis of hip: Secondary | ICD-10-CM | POA: Diagnosis not present

## 2016-04-04 DIAGNOSIS — M6281 Muscle weakness (generalized): Secondary | ICD-10-CM | POA: Diagnosis not present

## 2016-04-04 DIAGNOSIS — R293 Abnormal posture: Secondary | ICD-10-CM | POA: Diagnosis not present

## 2016-04-05 DIAGNOSIS — M6281 Muscle weakness (generalized): Secondary | ICD-10-CM | POA: Diagnosis not present

## 2016-04-05 DIAGNOSIS — R293 Abnormal posture: Secondary | ICD-10-CM | POA: Diagnosis not present

## 2016-04-05 DIAGNOSIS — M160 Bilateral primary osteoarthritis of hip: Secondary | ICD-10-CM | POA: Diagnosis not present

## 2016-04-05 DIAGNOSIS — R2681 Unsteadiness on feet: Secondary | ICD-10-CM | POA: Diagnosis not present

## 2016-04-05 DIAGNOSIS — R278 Other lack of coordination: Secondary | ICD-10-CM | POA: Diagnosis not present

## 2016-04-05 DIAGNOSIS — R2689 Other abnormalities of gait and mobility: Secondary | ICD-10-CM | POA: Diagnosis not present

## 2016-04-08 DIAGNOSIS — M6281 Muscle weakness (generalized): Secondary | ICD-10-CM | POA: Diagnosis not present

## 2016-04-08 DIAGNOSIS — R2689 Other abnormalities of gait and mobility: Secondary | ICD-10-CM | POA: Diagnosis not present

## 2016-04-08 DIAGNOSIS — R293 Abnormal posture: Secondary | ICD-10-CM | POA: Diagnosis not present

## 2016-04-08 DIAGNOSIS — R278 Other lack of coordination: Secondary | ICD-10-CM | POA: Diagnosis not present

## 2016-04-08 DIAGNOSIS — M160 Bilateral primary osteoarthritis of hip: Secondary | ICD-10-CM | POA: Diagnosis not present

## 2016-04-08 DIAGNOSIS — R2681 Unsteadiness on feet: Secondary | ICD-10-CM | POA: Diagnosis not present

## 2016-04-09 DIAGNOSIS — M6281 Muscle weakness (generalized): Secondary | ICD-10-CM | POA: Diagnosis not present

## 2016-04-09 DIAGNOSIS — R2681 Unsteadiness on feet: Secondary | ICD-10-CM | POA: Diagnosis not present

## 2016-04-09 DIAGNOSIS — R2689 Other abnormalities of gait and mobility: Secondary | ICD-10-CM | POA: Diagnosis not present

## 2016-04-09 DIAGNOSIS — R293 Abnormal posture: Secondary | ICD-10-CM | POA: Diagnosis not present

## 2016-04-09 DIAGNOSIS — M160 Bilateral primary osteoarthritis of hip: Secondary | ICD-10-CM | POA: Diagnosis not present

## 2016-04-09 DIAGNOSIS — R278 Other lack of coordination: Secondary | ICD-10-CM | POA: Diagnosis not present

## 2016-04-11 DIAGNOSIS — R293 Abnormal posture: Secondary | ICD-10-CM | POA: Diagnosis not present

## 2016-04-11 DIAGNOSIS — R2689 Other abnormalities of gait and mobility: Secondary | ICD-10-CM | POA: Diagnosis not present

## 2016-04-11 DIAGNOSIS — R2681 Unsteadiness on feet: Secondary | ICD-10-CM | POA: Diagnosis not present

## 2016-04-11 DIAGNOSIS — R278 Other lack of coordination: Secondary | ICD-10-CM | POA: Diagnosis not present

## 2016-04-11 DIAGNOSIS — M6281 Muscle weakness (generalized): Secondary | ICD-10-CM | POA: Diagnosis not present

## 2016-04-11 DIAGNOSIS — M160 Bilateral primary osteoarthritis of hip: Secondary | ICD-10-CM | POA: Diagnosis not present

## 2016-04-12 DIAGNOSIS — R293 Abnormal posture: Secondary | ICD-10-CM | POA: Diagnosis not present

## 2016-04-12 DIAGNOSIS — R2689 Other abnormalities of gait and mobility: Secondary | ICD-10-CM | POA: Diagnosis not present

## 2016-04-12 DIAGNOSIS — R2681 Unsteadiness on feet: Secondary | ICD-10-CM | POA: Diagnosis not present

## 2016-04-12 DIAGNOSIS — R278 Other lack of coordination: Secondary | ICD-10-CM | POA: Diagnosis not present

## 2016-04-12 DIAGNOSIS — M160 Bilateral primary osteoarthritis of hip: Secondary | ICD-10-CM | POA: Diagnosis not present

## 2016-04-12 DIAGNOSIS — M6281 Muscle weakness (generalized): Secondary | ICD-10-CM | POA: Diagnosis not present

## 2016-04-13 DIAGNOSIS — R278 Other lack of coordination: Secondary | ICD-10-CM | POA: Diagnosis not present

## 2016-04-13 DIAGNOSIS — R2689 Other abnormalities of gait and mobility: Secondary | ICD-10-CM | POA: Diagnosis not present

## 2016-04-13 DIAGNOSIS — R293 Abnormal posture: Secondary | ICD-10-CM | POA: Diagnosis not present

## 2016-04-13 DIAGNOSIS — R2681 Unsteadiness on feet: Secondary | ICD-10-CM | POA: Diagnosis not present

## 2016-04-13 DIAGNOSIS — M160 Bilateral primary osteoarthritis of hip: Secondary | ICD-10-CM | POA: Diagnosis not present

## 2016-04-13 DIAGNOSIS — M6281 Muscle weakness (generalized): Secondary | ICD-10-CM | POA: Diagnosis not present

## 2016-04-15 DIAGNOSIS — R2681 Unsteadiness on feet: Secondary | ICD-10-CM | POA: Diagnosis not present

## 2016-04-15 DIAGNOSIS — R293 Abnormal posture: Secondary | ICD-10-CM | POA: Diagnosis not present

## 2016-04-15 DIAGNOSIS — M6281 Muscle weakness (generalized): Secondary | ICD-10-CM | POA: Diagnosis not present

## 2016-04-15 DIAGNOSIS — R2689 Other abnormalities of gait and mobility: Secondary | ICD-10-CM | POA: Diagnosis not present

## 2016-04-15 DIAGNOSIS — M160 Bilateral primary osteoarthritis of hip: Secondary | ICD-10-CM | POA: Diagnosis not present

## 2016-04-15 DIAGNOSIS — R278 Other lack of coordination: Secondary | ICD-10-CM | POA: Diagnosis not present

## 2016-04-17 DIAGNOSIS — R2681 Unsteadiness on feet: Secondary | ICD-10-CM | POA: Diagnosis not present

## 2016-04-17 DIAGNOSIS — M6281 Muscle weakness (generalized): Secondary | ICD-10-CM | POA: Diagnosis not present

## 2016-04-17 DIAGNOSIS — M160 Bilateral primary osteoarthritis of hip: Secondary | ICD-10-CM | POA: Diagnosis not present

## 2016-04-17 DIAGNOSIS — R293 Abnormal posture: Secondary | ICD-10-CM | POA: Diagnosis not present

## 2016-04-17 DIAGNOSIS — R2689 Other abnormalities of gait and mobility: Secondary | ICD-10-CM | POA: Diagnosis not present

## 2016-04-17 DIAGNOSIS — R278 Other lack of coordination: Secondary | ICD-10-CM | POA: Diagnosis not present

## 2016-04-18 DIAGNOSIS — M160 Bilateral primary osteoarthritis of hip: Secondary | ICD-10-CM | POA: Diagnosis not present

## 2016-04-18 DIAGNOSIS — R278 Other lack of coordination: Secondary | ICD-10-CM | POA: Diagnosis not present

## 2016-04-18 DIAGNOSIS — R293 Abnormal posture: Secondary | ICD-10-CM | POA: Diagnosis not present

## 2016-04-18 DIAGNOSIS — R2689 Other abnormalities of gait and mobility: Secondary | ICD-10-CM | POA: Diagnosis not present

## 2016-04-18 DIAGNOSIS — M6281 Muscle weakness (generalized): Secondary | ICD-10-CM | POA: Diagnosis not present

## 2016-04-18 DIAGNOSIS — R2681 Unsteadiness on feet: Secondary | ICD-10-CM | POA: Diagnosis not present

## 2016-04-19 DIAGNOSIS — R339 Retention of urine, unspecified: Secondary | ICD-10-CM | POA: Diagnosis not present

## 2016-04-22 DIAGNOSIS — R2689 Other abnormalities of gait and mobility: Secondary | ICD-10-CM | POA: Diagnosis not present

## 2016-04-22 DIAGNOSIS — R2681 Unsteadiness on feet: Secondary | ICD-10-CM | POA: Diagnosis not present

## 2016-04-22 DIAGNOSIS — M6281 Muscle weakness (generalized): Secondary | ICD-10-CM | POA: Diagnosis not present

## 2016-04-22 DIAGNOSIS — M160 Bilateral primary osteoarthritis of hip: Secondary | ICD-10-CM | POA: Diagnosis not present

## 2016-04-22 DIAGNOSIS — R293 Abnormal posture: Secondary | ICD-10-CM | POA: Diagnosis not present

## 2016-04-22 DIAGNOSIS — R278 Other lack of coordination: Secondary | ICD-10-CM | POA: Diagnosis not present

## 2016-04-23 DIAGNOSIS — M6281 Muscle weakness (generalized): Secondary | ICD-10-CM | POA: Diagnosis not present

## 2016-04-23 DIAGNOSIS — M160 Bilateral primary osteoarthritis of hip: Secondary | ICD-10-CM | POA: Diagnosis not present

## 2016-04-23 DIAGNOSIS — R2681 Unsteadiness on feet: Secondary | ICD-10-CM | POA: Diagnosis not present

## 2016-04-23 DIAGNOSIS — R2689 Other abnormalities of gait and mobility: Secondary | ICD-10-CM | POA: Diagnosis not present

## 2016-04-23 DIAGNOSIS — R278 Other lack of coordination: Secondary | ICD-10-CM | POA: Diagnosis not present

## 2016-04-23 DIAGNOSIS — R293 Abnormal posture: Secondary | ICD-10-CM | POA: Diagnosis not present

## 2016-04-24 DIAGNOSIS — R2681 Unsteadiness on feet: Secondary | ICD-10-CM | POA: Diagnosis not present

## 2016-04-24 DIAGNOSIS — R293 Abnormal posture: Secondary | ICD-10-CM | POA: Diagnosis not present

## 2016-04-24 DIAGNOSIS — R278 Other lack of coordination: Secondary | ICD-10-CM | POA: Diagnosis not present

## 2016-04-24 DIAGNOSIS — M160 Bilateral primary osteoarthritis of hip: Secondary | ICD-10-CM | POA: Diagnosis not present

## 2016-04-24 DIAGNOSIS — R2689 Other abnormalities of gait and mobility: Secondary | ICD-10-CM | POA: Diagnosis not present

## 2016-04-24 DIAGNOSIS — M6281 Muscle weakness (generalized): Secondary | ICD-10-CM | POA: Diagnosis not present

## 2016-04-26 DIAGNOSIS — M160 Bilateral primary osteoarthritis of hip: Secondary | ICD-10-CM | POA: Diagnosis not present

## 2016-04-26 DIAGNOSIS — R2689 Other abnormalities of gait and mobility: Secondary | ICD-10-CM | POA: Diagnosis not present

## 2016-04-26 DIAGNOSIS — R2681 Unsteadiness on feet: Secondary | ICD-10-CM | POA: Diagnosis not present

## 2016-04-26 DIAGNOSIS — R29898 Other symptoms and signs involving the musculoskeletal system: Secondary | ICD-10-CM | POA: Diagnosis not present

## 2016-04-26 DIAGNOSIS — M6281 Muscle weakness (generalized): Secondary | ICD-10-CM | POA: Diagnosis not present

## 2016-04-26 DIAGNOSIS — R293 Abnormal posture: Secondary | ICD-10-CM | POA: Diagnosis not present

## 2016-04-29 DIAGNOSIS — R29898 Other symptoms and signs involving the musculoskeletal system: Secondary | ICD-10-CM | POA: Diagnosis not present

## 2016-04-29 DIAGNOSIS — M6281 Muscle weakness (generalized): Secondary | ICD-10-CM | POA: Diagnosis not present

## 2016-04-29 DIAGNOSIS — R2689 Other abnormalities of gait and mobility: Secondary | ICD-10-CM | POA: Diagnosis not present

## 2016-04-29 DIAGNOSIS — R293 Abnormal posture: Secondary | ICD-10-CM | POA: Diagnosis not present

## 2016-04-29 DIAGNOSIS — R2681 Unsteadiness on feet: Secondary | ICD-10-CM | POA: Diagnosis not present

## 2016-04-29 DIAGNOSIS — M160 Bilateral primary osteoarthritis of hip: Secondary | ICD-10-CM | POA: Diagnosis not present

## 2016-05-03 DIAGNOSIS — R2681 Unsteadiness on feet: Secondary | ICD-10-CM | POA: Diagnosis not present

## 2016-05-03 DIAGNOSIS — M5416 Radiculopathy, lumbar region: Secondary | ICD-10-CM | POA: Diagnosis not present

## 2016-05-03 DIAGNOSIS — R2689 Other abnormalities of gait and mobility: Secondary | ICD-10-CM | POA: Diagnosis not present

## 2016-05-03 DIAGNOSIS — M5117 Intervertebral disc disorders with radiculopathy, lumbosacral region: Secondary | ICD-10-CM | POA: Diagnosis not present

## 2016-05-03 DIAGNOSIS — M5136 Other intervertebral disc degeneration, lumbar region: Secondary | ICD-10-CM | POA: Diagnosis not present

## 2016-05-03 DIAGNOSIS — M160 Bilateral primary osteoarthritis of hip: Secondary | ICD-10-CM | POA: Diagnosis not present

## 2016-05-03 DIAGNOSIS — R29898 Other symptoms and signs involving the musculoskeletal system: Secondary | ICD-10-CM | POA: Diagnosis not present

## 2016-05-03 DIAGNOSIS — R293 Abnormal posture: Secondary | ICD-10-CM | POA: Diagnosis not present

## 2016-05-03 DIAGNOSIS — M6281 Muscle weakness (generalized): Secondary | ICD-10-CM | POA: Diagnosis not present

## 2016-05-06 DIAGNOSIS — M6281 Muscle weakness (generalized): Secondary | ICD-10-CM | POA: Diagnosis not present

## 2016-05-06 DIAGNOSIS — M160 Bilateral primary osteoarthritis of hip: Secondary | ICD-10-CM | POA: Diagnosis not present

## 2016-05-06 DIAGNOSIS — R29898 Other symptoms and signs involving the musculoskeletal system: Secondary | ICD-10-CM | POA: Diagnosis not present

## 2016-05-06 DIAGNOSIS — R293 Abnormal posture: Secondary | ICD-10-CM | POA: Diagnosis not present

## 2016-05-06 DIAGNOSIS — R2681 Unsteadiness on feet: Secondary | ICD-10-CM | POA: Diagnosis not present

## 2016-05-06 DIAGNOSIS — R2689 Other abnormalities of gait and mobility: Secondary | ICD-10-CM | POA: Diagnosis not present

## 2016-05-07 DIAGNOSIS — R29898 Other symptoms and signs involving the musculoskeletal system: Secondary | ICD-10-CM | POA: Diagnosis not present

## 2016-05-07 DIAGNOSIS — R293 Abnormal posture: Secondary | ICD-10-CM | POA: Diagnosis not present

## 2016-05-07 DIAGNOSIS — M160 Bilateral primary osteoarthritis of hip: Secondary | ICD-10-CM | POA: Diagnosis not present

## 2016-05-07 DIAGNOSIS — R2689 Other abnormalities of gait and mobility: Secondary | ICD-10-CM | POA: Diagnosis not present

## 2016-05-07 DIAGNOSIS — M6281 Muscle weakness (generalized): Secondary | ICD-10-CM | POA: Diagnosis not present

## 2016-05-07 DIAGNOSIS — R2681 Unsteadiness on feet: Secondary | ICD-10-CM | POA: Diagnosis not present

## 2016-05-08 DIAGNOSIS — M160 Bilateral primary osteoarthritis of hip: Secondary | ICD-10-CM | POA: Diagnosis not present

## 2016-05-08 DIAGNOSIS — R2689 Other abnormalities of gait and mobility: Secondary | ICD-10-CM | POA: Diagnosis not present

## 2016-05-08 DIAGNOSIS — R293 Abnormal posture: Secondary | ICD-10-CM | POA: Diagnosis not present

## 2016-05-08 DIAGNOSIS — R29898 Other symptoms and signs involving the musculoskeletal system: Secondary | ICD-10-CM | POA: Diagnosis not present

## 2016-05-08 DIAGNOSIS — M6281 Muscle weakness (generalized): Secondary | ICD-10-CM | POA: Diagnosis not present

## 2016-05-08 DIAGNOSIS — R2681 Unsteadiness on feet: Secondary | ICD-10-CM | POA: Diagnosis not present

## 2016-05-08 NOTE — Telephone Encounter (Signed)
[name] [name] can you close this phone note?

## 2016-05-09 DIAGNOSIS — R2681 Unsteadiness on feet: Secondary | ICD-10-CM | POA: Diagnosis not present

## 2016-05-09 DIAGNOSIS — R2689 Other abnormalities of gait and mobility: Secondary | ICD-10-CM | POA: Diagnosis not present

## 2016-05-09 DIAGNOSIS — R293 Abnormal posture: Secondary | ICD-10-CM | POA: Diagnosis not present

## 2016-05-09 DIAGNOSIS — R29898 Other symptoms and signs involving the musculoskeletal system: Secondary | ICD-10-CM | POA: Diagnosis not present

## 2016-05-09 DIAGNOSIS — M160 Bilateral primary osteoarthritis of hip: Secondary | ICD-10-CM | POA: Diagnosis not present

## 2016-05-09 DIAGNOSIS — M6281 Muscle weakness (generalized): Secondary | ICD-10-CM | POA: Diagnosis not present

## 2016-05-09 NOTE — Telephone Encounter (Signed)
I can if I delete the pended order. Is the order for the hospital bed no longer needed?

## 2016-05-10 DIAGNOSIS — M6281 Muscle weakness (generalized): Secondary | ICD-10-CM | POA: Diagnosis not present

## 2016-05-10 DIAGNOSIS — R29898 Other symptoms and signs involving the musculoskeletal system: Secondary | ICD-10-CM | POA: Diagnosis not present

## 2016-05-10 DIAGNOSIS — R2689 Other abnormalities of gait and mobility: Secondary | ICD-10-CM | POA: Diagnosis not present

## 2016-05-10 DIAGNOSIS — R293 Abnormal posture: Secondary | ICD-10-CM | POA: Diagnosis not present

## 2016-05-10 DIAGNOSIS — M160 Bilateral primary osteoarthritis of hip: Secondary | ICD-10-CM | POA: Diagnosis not present

## 2016-05-10 DIAGNOSIS — R2681 Unsteadiness on feet: Secondary | ICD-10-CM | POA: Diagnosis not present

## 2016-05-13 ENCOUNTER — Telehealth: Payer: Self-pay | Admitting: Internal Medicine

## 2016-05-13 DIAGNOSIS — R2689 Other abnormalities of gait and mobility: Secondary | ICD-10-CM | POA: Diagnosis not present

## 2016-05-13 DIAGNOSIS — M160 Bilateral primary osteoarthritis of hip: Secondary | ICD-10-CM | POA: Diagnosis not present

## 2016-05-13 DIAGNOSIS — R2681 Unsteadiness on feet: Secondary | ICD-10-CM | POA: Diagnosis not present

## 2016-05-13 DIAGNOSIS — R29898 Other symptoms and signs involving the musculoskeletal system: Secondary | ICD-10-CM | POA: Diagnosis not present

## 2016-05-13 DIAGNOSIS — R293 Abnormal posture: Secondary | ICD-10-CM | POA: Diagnosis not present

## 2016-05-13 DIAGNOSIS — M6281 Muscle weakness (generalized): Secondary | ICD-10-CM | POA: Diagnosis not present

## 2016-05-13 NOTE — Telephone Encounter (Signed)
Agree. Thx 

## 2016-05-13 NOTE — Telephone Encounter (Signed)
Routing to dr [employer], do you want office visit with patient---please advise, I will call patient back, thanks

## 2016-05-13 NOTE — Telephone Encounter (Signed)
Pt daughter called in and said that they are not giving pt bp med tonight and or in the morning.  They will keep a check on it today and in the morning

## 2016-05-13 NOTE — Telephone Encounter (Signed)
Pt daughter in law called in and said that pt could not do PT today because his bp was so low  95/53 rested then dropped 78/48 after water and come up to 90/60.  Gave have a few snacks and ended up being 105/62   Pt bp has been running low.  They are concerned  Best number to call her [phone]

## 2016-05-14 DIAGNOSIS — R2681 Unsteadiness on feet: Secondary | ICD-10-CM | POA: Diagnosis not present

## 2016-05-14 DIAGNOSIS — R29898 Other symptoms and signs involving the musculoskeletal system: Secondary | ICD-10-CM | POA: Diagnosis not present

## 2016-05-14 DIAGNOSIS — M6281 Muscle weakness (generalized): Secondary | ICD-10-CM | POA: Diagnosis not present

## 2016-05-14 DIAGNOSIS — R2689 Other abnormalities of gait and mobility: Secondary | ICD-10-CM | POA: Diagnosis not present

## 2016-05-14 DIAGNOSIS — M160 Bilateral primary osteoarthritis of hip: Secondary | ICD-10-CM | POA: Diagnosis not present

## 2016-05-14 DIAGNOSIS — R293 Abnormal posture: Secondary | ICD-10-CM | POA: Diagnosis not present

## 2016-05-16 DIAGNOSIS — M6281 Muscle weakness (generalized): Secondary | ICD-10-CM | POA: Diagnosis not present

## 2016-05-16 DIAGNOSIS — R293 Abnormal posture: Secondary | ICD-10-CM | POA: Diagnosis not present

## 2016-05-16 DIAGNOSIS — M160 Bilateral primary osteoarthritis of hip: Secondary | ICD-10-CM | POA: Diagnosis not present

## 2016-05-16 DIAGNOSIS — R2681 Unsteadiness on feet: Secondary | ICD-10-CM | POA: Diagnosis not present

## 2016-05-16 DIAGNOSIS — R29898 Other symptoms and signs involving the musculoskeletal system: Secondary | ICD-10-CM | POA: Diagnosis not present

## 2016-05-16 DIAGNOSIS — R2689 Other abnormalities of gait and mobility: Secondary | ICD-10-CM | POA: Diagnosis not present

## 2016-05-17 ENCOUNTER — Encounter: Payer: Self-pay | Admitting: *Deleted

## 2016-05-20 DIAGNOSIS — M160 Bilateral primary osteoarthritis of hip: Secondary | ICD-10-CM | POA: Diagnosis not present

## 2016-05-20 DIAGNOSIS — R2681 Unsteadiness on feet: Secondary | ICD-10-CM | POA: Diagnosis not present

## 2016-05-20 DIAGNOSIS — R2689 Other abnormalities of gait and mobility: Secondary | ICD-10-CM | POA: Diagnosis not present

## 2016-05-20 DIAGNOSIS — R293 Abnormal posture: Secondary | ICD-10-CM | POA: Diagnosis not present

## 2016-05-20 DIAGNOSIS — R29898 Other symptoms and signs involving the musculoskeletal system: Secondary | ICD-10-CM | POA: Diagnosis not present

## 2016-05-20 DIAGNOSIS — M6281 Muscle weakness (generalized): Secondary | ICD-10-CM | POA: Diagnosis not present

## 2016-05-21 DIAGNOSIS — R2681 Unsteadiness on feet: Secondary | ICD-10-CM | POA: Diagnosis not present

## 2016-05-21 DIAGNOSIS — M6281 Muscle weakness (generalized): Secondary | ICD-10-CM | POA: Diagnosis not present

## 2016-05-21 DIAGNOSIS — M160 Bilateral primary osteoarthritis of hip: Secondary | ICD-10-CM | POA: Diagnosis not present

## 2016-05-21 DIAGNOSIS — R293 Abnormal posture: Secondary | ICD-10-CM | POA: Diagnosis not present

## 2016-05-21 DIAGNOSIS — R29898 Other symptoms and signs involving the musculoskeletal system: Secondary | ICD-10-CM | POA: Diagnosis not present

## 2016-05-21 DIAGNOSIS — R2689 Other abnormalities of gait and mobility: Secondary | ICD-10-CM | POA: Diagnosis not present

## 2016-05-23 DIAGNOSIS — R29898 Other symptoms and signs involving the musculoskeletal system: Secondary | ICD-10-CM | POA: Diagnosis not present

## 2016-05-23 DIAGNOSIS — R2689 Other abnormalities of gait and mobility: Secondary | ICD-10-CM | POA: Diagnosis not present

## 2016-05-23 DIAGNOSIS — M6281 Muscle weakness (generalized): Secondary | ICD-10-CM | POA: Diagnosis not present

## 2016-05-23 DIAGNOSIS — M160 Bilateral primary osteoarthritis of hip: Secondary | ICD-10-CM | POA: Diagnosis not present

## 2016-05-23 DIAGNOSIS — R293 Abnormal posture: Secondary | ICD-10-CM | POA: Diagnosis not present

## 2016-05-23 DIAGNOSIS — R2681 Unsteadiness on feet: Secondary | ICD-10-CM | POA: Diagnosis not present

## 2016-05-27 ENCOUNTER — Encounter: Payer: Self-pay | Admitting: Internal Medicine

## 2016-05-27 ENCOUNTER — Ambulatory Visit (INDEPENDENT_AMBULATORY_CARE_PROVIDER_SITE_OTHER): Payer: Medicare Other | Admitting: Internal Medicine

## 2016-05-27 VITALS — BP 120/60 | HR 63 | Ht 64.0 in | Wt 156.8 lb

## 2016-05-27 DIAGNOSIS — I495 Sick sinus syndrome: Secondary | ICD-10-CM

## 2016-05-27 DIAGNOSIS — Z950 Presence of cardiac pacemaker: Secondary | ICD-10-CM | POA: Diagnosis not present

## 2016-05-27 NOTE — Progress Notes (Signed)
HPI [name] [name] returns today for followup. He is a pleasant elderly man with an ICM, chronic systolic CHF, s/p ICD implant. He has class 2 CHF symptoms. No syncope. No edema.  He notes that he occasionally uses his nebulizer to help with his wheezing. He walks regularly. No chest pain. He is in rehab in a SNF. He has undergone device generator change out. He has had no trouble healing his pocket. He was in the hospital after a fall. Allergies  Allergen Reactions  . Aleve [Naproxen Sodium] Other (See Comments)    Bleeding ulcers  . Ibuprofen Other (See Comments)    Bleeding ulcers  . Tramadol     Hallucinations      Current Outpatient Prescriptions  Medication Sig Dispense Refill  . acetaminophen (TYLENOL) 500 MG tablet Take 500 mg by mouth every 6 (six) hours as needed for headache.    . albuterol (PROVENTIL) (2.5 MG/3ML) 0.083% nebulizer solution USE ONE VIAL VIA NEBULIZER FOUR TIMES DAILY AS NEEDED. 375 mL 11  . AMBULATORY NON FORMULARY MEDICATION Diltiazem gel 2% Apply small amount pea-sized to anal area 2-3 times a day for anal stenosis. Use as needed. 30 g 5  . Artificial Tear Ointment (DRY EYES OP) Apply 1 drop to eye daily.    Marland Kitchen atorvastatin (LIPITOR) 20 MG tablet Take 0.5 tablets (10 mg total) by mouth 2 (two) times daily. 90 tablet 3  . diltiazem 2 % GEL Apply 1 application topically 3 (three) times daily. 30 g 11  . diphenoxylate-atropine (LOMOTIL) 2.5-0.025 MG tablet Take 1 tablet by mouth 4 (four) times daily as needed for diarrhea or loose stools. 60 tablet 1  . donepezil (ARICEPT) 10 MG tablet Take 1 tablet (10 mg total) by mouth at bedtime. 90 tablet 3  . fluticasone (FLONASE) 50 MCG/ACT nasal spray Place 2 sprays into both nostrils daily. 48 g 3  . folic acid (FOLVITE) Q000111Q MCG tablet Take 400 mcg by mouth daily.     Marland Kitchen levothyroxine (SYNTHROID, LEVOTHROID) 112 MCG tablet Take 1 tablet (112 mcg total) by mouth daily. 30 tablet 11  . metoprolol succinate (TOPROL-XL) 25 MG 24  hr tablet Take 0.5 tablets (12.5 mg total) by mouth 2 (two) times daily. 90 tablet 1  . multivitamin-iron-minerals-folic acid (CENTRUM) chewable tablet Chew 1 tablet by mouth daily.    . nitroGLYCERIN (NITROSTAT) 0.4 MG SL tablet Place 1 tablet (0.4 mg total) under the tongue every 5 (five) minutes as needed. Chest pain 20 tablet 1  . pantoprazole (PROTONIX) 40 MG tablet Take 1 tablet (40 mg total) by mouth daily. 90 tablet 3  . triamcinolone (NASACORT AQ) 55 MCG/ACT AERO nasal inhaler Place 2 sprays into the nose daily. 3 Inhaler 3   No current facility-administered medications for this visit.    PMHX Chronic systolic heart failure Sinus node dysfunction    ROS:   All systems reviewed and negative except as noted in the HPI.   Past Surgical History:  Procedure Laterality Date  . APPENDECTOMY    . COLONOSCOPY  12/1998, 02/2004   diverticulosois, external hemorrhoids  . CRANIOTOMY Left   . EP IMPLANTABLE DEVICE N/A 06/12/2015   Procedure: Pacemaker Implant ;  Surgeon: Evans Lance, MD;  Location: [facility];  Service: Cardiovascular;  Laterality: N/A;  . ESOPHAGOGASTRODUODENOSCOPY  06/17/2014   Dr. Oneida Alar: 1. Patent stricture at the gastroesophageal junction 2. UGIB Due to multiple  gastric ulcers 3. Moderate Duodentitis. Negative H.pylori  . ESOPHAGOGASTRODUODENOSCOPY N/A 10/17/2014  Procedure: ESOPHAGOGASTRODUODENOSCOPY (EGD);  Surgeon: Danie Binder, MD;  [facility];  Service: Endoscopy;  Laterality: N/A;  1030  . ESOPHAGOGASTRODUODENOSCOPY N/A 02/10/2015   Procedure: ESOPHAGOGASTRODUODENOSCOPY (EGD);  Surgeon: Danie Binder, MD;  [facility];  Service: Endoscopy;  Laterality: N/A;  115  . heart disease     permanent pacemaker  . HIP FRACTURE SURGERY  2011   ORIF  R.  . INGUINAL HERNIA REPAIR    . IR GENERIC HISTORICAL  10/26/2015   IR RADIOLOGIST EVAL & MGMT 10/26/2015 [facility]  . PACEMAKER INSERTION       Family History  Problem  Relation Age of Onset  . Cancer Sister   . Coronary artery disease      male 1st degree relative<60  . Hypertension       Social History   Social History  . Marital status: Widowed    Spouse name: N/A  . Number of children: 2  . Years of education: N/A   Occupational History  . retired Retired   Social History Main Topics  . Smoking status: Former Smoker    Packs/day: 1.00    Years: 30.00    Quit date: 10/26/1976  . Smokeless tobacco: Never Used  . Alcohol use No  . Drug use: No  . Sexual activity: Not Currently   Other Topics Concern  . Not on file   Social History Narrative   Regular exercise--no.     BP 120/60   Pulse 63   Ht 5\' 4"  (1.626 m)   Wt 156 lb 12.8 oz (71.1 kg)   SpO2 93%   BMI 26.91 kg/m   Physical Exam: Chronically ill appearing elderly man, NAD HEENT: Unremarkable Neck:   7 cm JVD, no thyromegally Lungs:  Clear with no wheezes, or rhonchi. Rales in left base. HEART:  Regular rate rhythm, no murmurs, no rubs, no clicks Abd:  soft, positive bowel sounds, no organomegally, no rebound, no guarding Ext:  2 plus pulses, no edema, no cyanosis, no clubbing Skin:  No rashes no nodules Neuro:  CN II through XII intact, motor grossly intact   DEVICE  Normal device function.  See PaceArt for details.   Assess/Plan:  1. Chronic systolic heart failure - he appears euvolemic. No medication change 2. S/p ICD implant - he has undergone downgrade from a DDD ICD to a DDD PM.  3. Sinus node dysfunction -he is s/p DDD PM.  [name] [name].D.

## 2016-05-27 NOTE — Patient Instructions (Signed)
Medication Instructions:  Your physician recommends that you continue on your current medications as directed. Please refer to the Current Medication list given to you today.   Labwork: None Ordered   Testing/Procedures: None Ordered   Follow-Up: Your physician wants you to follow-up in: 1 year with Dr. Taylor.  You will receive a reminder letter in the mail two months in advance. If you don't receive a letter, please call our office to schedule the follow-up appointment.  Remote monitoring is used to monitor your Pacemaker from home. This monitoring reduces the number of office visits required to check your device to one time per year. It allows us to keep an eye on the functioning of your device to ensure it is working properly. You are scheduled for a device check from home on 08/26/16. You may send your transmission at any time that day. If you have a wireless device, the transmission will be sent automatically. After your physician reviews your transmission, you will receive a postcard with your next transmission date. .   Any Other Special Instructions Will Be Listed Below (If Applicable).     If you need a refill on your cardiac medications before your next appointment, please call your pharmacy.   

## 2016-05-28 DIAGNOSIS — R293 Abnormal posture: Secondary | ICD-10-CM | POA: Diagnosis not present

## 2016-05-28 DIAGNOSIS — M160 Bilateral primary osteoarthritis of hip: Secondary | ICD-10-CM | POA: Diagnosis not present

## 2016-05-28 DIAGNOSIS — R2681 Unsteadiness on feet: Secondary | ICD-10-CM | POA: Diagnosis not present

## 2016-05-28 DIAGNOSIS — M6281 Muscle weakness (generalized): Secondary | ICD-10-CM | POA: Diagnosis not present

## 2016-05-28 DIAGNOSIS — R29898 Other symptoms and signs involving the musculoskeletal system: Secondary | ICD-10-CM | POA: Diagnosis not present

## 2016-05-28 DIAGNOSIS — R2689 Other abnormalities of gait and mobility: Secondary | ICD-10-CM | POA: Diagnosis not present

## 2016-05-29 DIAGNOSIS — M6281 Muscle weakness (generalized): Secondary | ICD-10-CM | POA: Diagnosis not present

## 2016-05-29 DIAGNOSIS — R2689 Other abnormalities of gait and mobility: Secondary | ICD-10-CM | POA: Diagnosis not present

## 2016-05-29 DIAGNOSIS — M160 Bilateral primary osteoarthritis of hip: Secondary | ICD-10-CM | POA: Diagnosis not present

## 2016-05-29 DIAGNOSIS — R293 Abnormal posture: Secondary | ICD-10-CM | POA: Diagnosis not present

## 2016-05-29 DIAGNOSIS — R29898 Other symptoms and signs involving the musculoskeletal system: Secondary | ICD-10-CM | POA: Diagnosis not present

## 2016-05-29 DIAGNOSIS — R2681 Unsteadiness on feet: Secondary | ICD-10-CM | POA: Diagnosis not present

## 2016-05-30 DIAGNOSIS — R29898 Other symptoms and signs involving the musculoskeletal system: Secondary | ICD-10-CM | POA: Diagnosis not present

## 2016-05-30 DIAGNOSIS — R293 Abnormal posture: Secondary | ICD-10-CM | POA: Diagnosis not present

## 2016-05-30 DIAGNOSIS — R2689 Other abnormalities of gait and mobility: Secondary | ICD-10-CM | POA: Diagnosis not present

## 2016-05-30 DIAGNOSIS — M6281 Muscle weakness (generalized): Secondary | ICD-10-CM | POA: Diagnosis not present

## 2016-05-30 DIAGNOSIS — M160 Bilateral primary osteoarthritis of hip: Secondary | ICD-10-CM | POA: Diagnosis not present

## 2016-05-30 DIAGNOSIS — R2681 Unsteadiness on feet: Secondary | ICD-10-CM | POA: Diagnosis not present

## 2016-05-31 LAB — CUP PACEART INCLINIC DEVICE CHECK
Battery Impedance: 100 Ohm
Battery Remaining Longevity: 145 mo
Battery Voltage: 2.78 V
Brady Statistic AP VP Percent: 1 %
Date Time Interrogation Session: 20180306003315
Implantable Lead Implant Date: 20031204
Implantable Lead Location: 753859
Implantable Lead Location: 753860
Implantable Lead Model: 5076
Implantable Pulse Generator Implant Date: 20170320
Lead Channel Impedance Value: 474 Ohm
Lead Channel Pacing Threshold Amplitude: 0.5 V
Lead Channel Pacing Threshold Pulse Width: 0.4 ms
Lead Channel Pacing Threshold Pulse Width: 0.4 ms
Lead Channel Setting Pacing Amplitude: 2.5 V
Lead Channel Setting Sensing Sensitivity: 5.6 mV
MDC IDC LEAD IMPLANT DT: 20031204
MDC IDC MSMT LEADCHNL RA IMPEDANCE VALUE: 513 Ohm
MDC IDC MSMT LEADCHNL RA PACING THRESHOLD AMPLITUDE: 0.625 V
MDC IDC MSMT LEADCHNL RA SENSING INTR AMPL: 4 mV
MDC IDC MSMT LEADCHNL RV PACING THRESHOLD AMPLITUDE: 1 V
MDC IDC MSMT LEADCHNL RV PACING THRESHOLD AMPLITUDE: 1 V
MDC IDC MSMT LEADCHNL RV PACING THRESHOLD PULSEWIDTH: 0.4 ms
MDC IDC MSMT LEADCHNL RV PACING THRESHOLD PULSEWIDTH: 0.4 ms
MDC IDC MSMT LEADCHNL RV SENSING INTR AMPL: 15.67 mV
MDC IDC SET LEADCHNL RA PACING AMPLITUDE: 2 V
MDC IDC SET LEADCHNL RV PACING PULSEWIDTH: 0.4 ms
MDC IDC STAT BRADY AP VS PERCENT: 94 %
MDC IDC STAT BRADY AS VP PERCENT: 0 %
MDC IDC STAT BRADY AS VS PERCENT: 4 %

## 2016-06-03 DIAGNOSIS — R293 Abnormal posture: Secondary | ICD-10-CM | POA: Diagnosis not present

## 2016-06-03 DIAGNOSIS — R29898 Other symptoms and signs involving the musculoskeletal system: Secondary | ICD-10-CM | POA: Diagnosis not present

## 2016-06-03 DIAGNOSIS — R2689 Other abnormalities of gait and mobility: Secondary | ICD-10-CM | POA: Diagnosis not present

## 2016-06-03 DIAGNOSIS — M6281 Muscle weakness (generalized): Secondary | ICD-10-CM | POA: Diagnosis not present

## 2016-06-03 DIAGNOSIS — M160 Bilateral primary osteoarthritis of hip: Secondary | ICD-10-CM | POA: Diagnosis not present

## 2016-06-03 DIAGNOSIS — R2681 Unsteadiness on feet: Secondary | ICD-10-CM | POA: Diagnosis not present

## 2016-06-04 DIAGNOSIS — R2681 Unsteadiness on feet: Secondary | ICD-10-CM | POA: Diagnosis not present

## 2016-06-04 DIAGNOSIS — M160 Bilateral primary osteoarthritis of hip: Secondary | ICD-10-CM | POA: Diagnosis not present

## 2016-06-04 DIAGNOSIS — M6281 Muscle weakness (generalized): Secondary | ICD-10-CM | POA: Diagnosis not present

## 2016-06-04 DIAGNOSIS — R29898 Other symptoms and signs involving the musculoskeletal system: Secondary | ICD-10-CM | POA: Diagnosis not present

## 2016-06-04 DIAGNOSIS — R293 Abnormal posture: Secondary | ICD-10-CM | POA: Diagnosis not present

## 2016-06-04 DIAGNOSIS — R2689 Other abnormalities of gait and mobility: Secondary | ICD-10-CM | POA: Diagnosis not present

## 2016-06-05 DIAGNOSIS — M160 Bilateral primary osteoarthritis of hip: Secondary | ICD-10-CM | POA: Diagnosis not present

## 2016-06-05 DIAGNOSIS — R293 Abnormal posture: Secondary | ICD-10-CM | POA: Diagnosis not present

## 2016-06-05 DIAGNOSIS — R2689 Other abnormalities of gait and mobility: Secondary | ICD-10-CM | POA: Diagnosis not present

## 2016-06-05 DIAGNOSIS — R2681 Unsteadiness on feet: Secondary | ICD-10-CM | POA: Diagnosis not present

## 2016-06-05 DIAGNOSIS — M6281 Muscle weakness (generalized): Secondary | ICD-10-CM | POA: Diagnosis not present

## 2016-06-05 DIAGNOSIS — R29898 Other symptoms and signs involving the musculoskeletal system: Secondary | ICD-10-CM | POA: Diagnosis not present

## 2016-06-06 DIAGNOSIS — R293 Abnormal posture: Secondary | ICD-10-CM | POA: Diagnosis not present

## 2016-06-06 DIAGNOSIS — R29898 Other symptoms and signs involving the musculoskeletal system: Secondary | ICD-10-CM | POA: Diagnosis not present

## 2016-06-06 DIAGNOSIS — R2689 Other abnormalities of gait and mobility: Secondary | ICD-10-CM | POA: Diagnosis not present

## 2016-06-06 DIAGNOSIS — R2681 Unsteadiness on feet: Secondary | ICD-10-CM | POA: Diagnosis not present

## 2016-06-06 DIAGNOSIS — M6281 Muscle weakness (generalized): Secondary | ICD-10-CM | POA: Diagnosis not present

## 2016-06-06 DIAGNOSIS — M160 Bilateral primary osteoarthritis of hip: Secondary | ICD-10-CM | POA: Diagnosis not present

## 2016-06-11 DIAGNOSIS — M160 Bilateral primary osteoarthritis of hip: Secondary | ICD-10-CM | POA: Diagnosis not present

## 2016-06-11 DIAGNOSIS — R29898 Other symptoms and signs involving the musculoskeletal system: Secondary | ICD-10-CM | POA: Diagnosis not present

## 2016-06-11 DIAGNOSIS — M6281 Muscle weakness (generalized): Secondary | ICD-10-CM | POA: Diagnosis not present

## 2016-06-11 DIAGNOSIS — R293 Abnormal posture: Secondary | ICD-10-CM | POA: Diagnosis not present

## 2016-06-11 DIAGNOSIS — R2689 Other abnormalities of gait and mobility: Secondary | ICD-10-CM | POA: Diagnosis not present

## 2016-06-11 DIAGNOSIS — R2681 Unsteadiness on feet: Secondary | ICD-10-CM | POA: Diagnosis not present

## 2016-06-12 ENCOUNTER — Other Ambulatory Visit (INDEPENDENT_AMBULATORY_CARE_PROVIDER_SITE_OTHER): Payer: Medicare Other

## 2016-06-12 ENCOUNTER — Ambulatory Visit (INDEPENDENT_AMBULATORY_CARE_PROVIDER_SITE_OTHER): Payer: Medicare Other | Admitting: Internal Medicine

## 2016-06-12 ENCOUNTER — Encounter: Payer: Self-pay | Admitting: Internal Medicine

## 2016-06-12 DIAGNOSIS — K624 Stenosis of anus and rectum: Secondary | ICD-10-CM | POA: Diagnosis not present

## 2016-06-12 DIAGNOSIS — L853 Xerosis cutis: Secondary | ICD-10-CM | POA: Diagnosis not present

## 2016-06-12 DIAGNOSIS — I10 Essential (primary) hypertension: Secondary | ICD-10-CM | POA: Diagnosis not present

## 2016-06-12 DIAGNOSIS — M5440 Lumbago with sciatica, unspecified side: Secondary | ICD-10-CM | POA: Diagnosis not present

## 2016-06-12 DIAGNOSIS — E785 Hyperlipidemia, unspecified: Secondary | ICD-10-CM | POA: Diagnosis not present

## 2016-06-12 DIAGNOSIS — I5042 Chronic combined systolic (congestive) and diastolic (congestive) heart failure: Secondary | ICD-10-CM

## 2016-06-12 LAB — CBC WITH DIFFERENTIAL/PLATELET
BASOS PCT: 0.5 % (ref 0.0–3.0)
Basophils Absolute: 0.1 10*3/uL (ref 0.0–0.1)
EOS ABS: 0.7 10*3/uL (ref 0.0–0.7)
Eosinophils Relative: 5.9 % — ABNORMAL HIGH (ref 0.0–5.0)
HCT: 35.7 % — ABNORMAL LOW (ref 39.0–52.0)
Hemoglobin: 11.8 g/dL — ABNORMAL LOW (ref 13.0–17.0)
Lymphocytes Relative: 15.7 % (ref 12.0–46.0)
Lymphs Abs: 1.8 10*3/uL (ref 0.7–4.0)
MCHC: 33.2 g/dL (ref 30.0–36.0)
MCV: 94.7 fl (ref 78.0–100.0)
MONO ABS: 1.9 10*3/uL — AB (ref 0.1–1.0)
Monocytes Relative: 16.4 % — ABNORMAL HIGH (ref 3.0–12.0)
NEUTROS ABS: 7.1 10*3/uL (ref 1.4–7.7)
Neutrophils Relative %: 61.5 % (ref 43.0–77.0)
PLATELETS: 190 10*3/uL (ref 150.0–400.0)
RBC: 3.77 Mil/uL — ABNORMAL LOW (ref 4.22–5.81)
RDW: 14.5 % (ref 11.5–15.5)
WBC: 11.6 10*3/uL — ABNORMAL HIGH (ref 4.0–10.5)

## 2016-06-12 LAB — HEPATIC FUNCTION PANEL
ALK PHOS: 351 U/L — AB (ref 39–117)
ALT: 34 U/L (ref 0–53)
AST: 32 U/L (ref 0–37)
Albumin: 3.7 g/dL (ref 3.5–5.2)
Bilirubin, Direct: 0.1 mg/dL (ref 0.0–0.3)
TOTAL PROTEIN: 6.9 g/dL (ref 6.0–8.3)
Total Bilirubin: 0.6 mg/dL (ref 0.2–1.2)

## 2016-06-12 LAB — BASIC METABOLIC PANEL
BUN: 46 mg/dL — ABNORMAL HIGH (ref 6–23)
CHLORIDE: 101 meq/L (ref 96–112)
CO2: 20 meq/L (ref 19–32)
CREATININE: 1.84 mg/dL — AB (ref 0.40–1.50)
Calcium: 9.1 mg/dL (ref 8.4–10.5)
GFR: 36.77 mL/min — ABNORMAL LOW (ref >60.00)
Glucose, Bld: 140 mg/dL — ABNORMAL HIGH (ref 70–99)
Potassium: 4.3 mEq/L (ref 3.5–5.1)
Sodium: 131 mEq/L — ABNORMAL LOW (ref 135–145)

## 2016-06-12 LAB — TSH: TSH: 3.86 u[IU]/mL (ref 0.35–4.50)

## 2016-06-12 MED ORDER — TRIAMCINOLONE ACETONIDE 0.1 % EX OINT: 1.0000 "application " | TOPICAL_OINTMENT | Freq: Two times a day (BID) | CUTANEOUS | 2 refills | Status: AC

## 2016-06-12 MED ORDER — DILTIAZEM GEL 2 %: 1.0000 "application " | Freq: Three times a day (TID) | CUTANEOUS | 11 refills | Status: AC

## 2016-06-12 NOTE — Assessment & Plan Note (Signed)
Toprol 

## 2016-06-12 NOTE — Progress Notes (Signed)
Pre-visit discussion using our clinic review tool. No additional management support is needed unless otherwise documented below in the visit note.  

## 2016-06-12 NOTE — Assessment & Plan Note (Signed)
Metoprolol 

## 2016-06-12 NOTE — Assessment & Plan Note (Signed)
Dyslipidemia

## 2016-06-12 NOTE — Assessment & Plan Note (Signed)
Compr fx is healing

## 2016-06-12 NOTE — Progress Notes (Signed)
Subjective:  Patient ID: [name] [name], male    DOB: May 27, 1922  Age: 83 y.o. MRN: [id number]  CC: Follow-up (hyperlipidemia, HTN, Hypothyroid, )   HPI [name] [name] presents for CAD, CHF, compr fx, hypothyroidism f/u  Outpatient Medications Prior to Visit  Medication Sig Dispense Refill  . acetaminophen (TYLENOL) 500 MG tablet Take 500 mg by mouth every 6 (six) hours as needed for headache.    . albuterol (PROVENTIL) (2.5 MG/3ML) 0.083% nebulizer solution USE ONE VIAL VIA NEBULIZER FOUR TIMES DAILY AS NEEDED. 375 mL 11  . AMBULATORY NON FORMULARY MEDICATION Diltiazem gel 2% Apply small amount pea-sized to anal area 2-3 times a day for anal stenosis. Use as needed. 30 g 5  . Artificial Tear Ointment (DRY EYES OP) Apply 1 drop to eye daily.    Marland Kitchen atorvastatin (LIPITOR) 20 MG tablet Take 0.5 tablets (10 mg total) by mouth 2 (two) times daily. 90 tablet 3  . diltiazem 2 % GEL Apply 1 application topically 3 (three) times daily. 30 g 11  . diphenoxylate-atropine (LOMOTIL) 2.5-0.025 MG tablet Take 1 tablet by mouth 4 (four) times daily as needed for diarrhea or loose stools. 60 tablet 1  . donepezil (ARICEPT) 10 MG tablet Take 1 tablet (10 mg total) by mouth at bedtime. 90 tablet 3  . fluticasone (FLONASE) 50 MCG/ACT nasal spray Place 2 sprays into both nostrils daily. 48 g 3  . folic acid (FOLVITE) 150 MCG tablet Take 400 mcg by mouth daily.     Marland Kitchen levothyroxine (SYNTHROID, LEVOTHROID) 112 MCG tablet Take 1 tablet (112 mcg total) by mouth daily. 30 tablet 11  . metoprolol succinate (TOPROL-XL) 25 MG 24 hr tablet Take 0.5 tablets (12.5 mg total) by mouth 2 (two) times daily. 90 tablet 1  . multivitamin-iron-minerals-folic acid (CENTRUM) chewable tablet Chew 1 tablet by mouth daily.    . nitroGLYCERIN (NITROSTAT) 0.4 MG SL tablet Place 1 tablet (0.4 mg total) under the tongue every 5 (five) minutes as needed. Chest pain 20 tablet 1  . pantoprazole (PROTONIX) 40 MG tablet Take 1 tablet (40 mg  total) by mouth daily. 90 tablet 3  . triamcinolone (NASACORT AQ) 55 MCG/ACT AERO nasal inhaler Place 2 sprays into the nose daily. 3 Inhaler 3   No facility-administered medications prior to visit.     ROS Review of Systems  Constitutional: Positive for fatigue. Negative for appetite change and unexpected weight change.  HENT: Negative for congestion, nosebleeds, sneezing, sore throat and trouble swallowing.   Eyes: Negative for itching and visual disturbance.  Respiratory: Negative for cough.   Cardiovascular: Negative for chest pain, palpitations and leg swelling.  Gastrointestinal: Negative for abdominal distention, blood in stool, diarrhea and nausea.  Genitourinary: Negative for frequency and hematuria.  Musculoskeletal: Positive for arthralgias and back pain. Negative for gait problem, joint swelling and neck pain.  Skin: Negative for rash.  Neurological: Negative for dizziness, tremors, speech difficulty and weakness.  Psychiatric/Behavioral: Negative for agitation, dysphoric mood and sleep disturbance. The patient is not nervous/anxious.     Objective:  BP 122/64   Pulse 69   Temp 97.7 F (36.5 C) (Oral)   Resp 16   Ht 5\' 4"  (1.626 m)   Wt 157 lb 4 oz (71.3 kg)   SpO2 96%   BMI 26.99 kg/m   BP Readings from Last 3 Encounters:  06/12/16 122/64  05/27/16 120/60  03/17/16 155/66    Wt Readings from Last 3 Encounters:  06/12/16 157 lb  4 oz (71.3 kg)  05/27/16 156 lb 12.8 oz (71.1 kg)  03/13/16 157 lb (71.2 kg)    Physical Exam  Constitutional: He is oriented to person, place, and time. He appears well-developed. No distress.  NAD  HENT:  Mouth/Throat: Oropharynx is clear and moist.  Eyes: Conjunctivae are normal. Pupils are equal, round, and reactive to light.  Neck: Normal range of motion. No JVD present. No thyromegaly present.  Cardiovascular: Normal rate, regular rhythm, normal heart sounds and intact distal pulses.  Exam reveals no gallop and no friction  rub.   No murmur heard. Pulmonary/Chest: Effort normal and breath sounds normal. No respiratory distress. He has no wheezes. He has no rales. He exhibits no tenderness.  Abdominal: Soft. Bowel sounds are normal. He exhibits no distension and no mass. There is no tenderness. There is no rebound and no guarding.  Musculoskeletal: Normal range of motion. He exhibits tenderness. He exhibits no edema.  Lymphadenopathy:    He has no cervical adenopathy.  Neurological: He is alert and oriented to person, place, and time. He has normal reflexes. No cranial nerve deficit. He exhibits normal muscle tone. He displays a negative Romberg sign. Coordination abnormal. Gait normal.  Skin: Skin is warm and dry. No rash noted.  Psychiatric: He has a normal mood and affect. His behavior is normal. Judgment and thought content normal.  A walker  Lab Results  Component Value Date   WBC 8.9 03/17/2016   HGB 11.8 (L) 03/17/2016   HCT 33.7 (L) 03/17/2016   PLT 141 (L) 03/17/2016   GLUCOSE 102 (H) 03/17/2016   CHOL 110 05/25/2013   TRIG 67.0 05/25/2013   HDL 40.30 05/25/2013   LDLCALC 56 05/25/2013   ALT 24 03/17/2016   AST 28 03/17/2016   NA 130 (L) 03/17/2016   K 4.2 03/17/2016   CL 102 03/17/2016   CREATININE 1.54 (H) 03/17/2016   BUN 27 (H) 03/17/2016   CO2 22 03/17/2016   TSH 6.20 (H) 06/07/2015   PSA 3.42 11/28/2011   INR 1.15 10/12/2015    Dg Lumbar Spine Complete  Result Date: 03/17/2016 CLINICAL DATA:  Low back pain EXAM: LUMBAR SPINE - COMPLETE 4+ VIEW COMPARISON:  06/07/2015, 08/22/2015 FINDINGS: Previous L4 vertebral augmentation pain bilateral sacralplasty. Stable osteopenia and diffuse spondylosis of the lower thoracic and lumbar spine. Chronic mild stable compression at T12 with slight increase kyphosis as before. Previous right hip arthroplasty partially imaged. Aortoiliac atherosclerosis present. Normal bowel gas pattern. IMPRESSION: Stable chronic degenerative changes and previous L4  vertebral augmentation and bilateral sacral plasty. Diffuse osteopenia No definite acute finding or interval change by plain radiography Aortoiliac atherosclerosis Electronically Signed   By: Jerilynn Mages.  Shick M.D.   On: 03/17/2016 14:01    Assessment & Plan:   There are no diagnoses linked to this encounter. I am having Mr. Pollio maintain his folic acid, fluticasone, acetaminophen, Artificial Tear Ointment (DRY EYES OP), AMBULATORY NON FORMULARY MEDICATION, pantoprazole, diphenoxylate-atropine, albuterol, levothyroxine, diltiazem, multivitamin-iron-minerals-folic acid, atorvastatin, donepezil, triamcinolone, metoprolol succinate, and nitroGLYCERIN.  No orders of the defined types were placed in this encounter.    Follow-up: No Follow-up on file.  Walker Kehr, MD

## 2016-06-12 NOTE — Assessment & Plan Note (Signed)
Triamc 0.1% prn Short showers

## 2016-06-13 DIAGNOSIS — R2689 Other abnormalities of gait and mobility: Secondary | ICD-10-CM | POA: Diagnosis not present

## 2016-06-13 DIAGNOSIS — M160 Bilateral primary osteoarthritis of hip: Secondary | ICD-10-CM | POA: Diagnosis not present

## 2016-06-13 DIAGNOSIS — R293 Abnormal posture: Secondary | ICD-10-CM | POA: Diagnosis not present

## 2016-06-13 DIAGNOSIS — R29898 Other symptoms and signs involving the musculoskeletal system: Secondary | ICD-10-CM | POA: Diagnosis not present

## 2016-06-13 DIAGNOSIS — M6281 Muscle weakness (generalized): Secondary | ICD-10-CM | POA: Diagnosis not present

## 2016-06-13 DIAGNOSIS — R2681 Unsteadiness on feet: Secondary | ICD-10-CM | POA: Diagnosis not present

## 2016-06-14 DIAGNOSIS — R2689 Other abnormalities of gait and mobility: Secondary | ICD-10-CM | POA: Diagnosis not present

## 2016-06-14 DIAGNOSIS — R293 Abnormal posture: Secondary | ICD-10-CM | POA: Diagnosis not present

## 2016-06-14 DIAGNOSIS — R2681 Unsteadiness on feet: Secondary | ICD-10-CM | POA: Diagnosis not present

## 2016-06-14 DIAGNOSIS — R29898 Other symptoms and signs involving the musculoskeletal system: Secondary | ICD-10-CM | POA: Diagnosis not present

## 2016-06-14 DIAGNOSIS — M6281 Muscle weakness (generalized): Secondary | ICD-10-CM | POA: Diagnosis not present

## 2016-06-14 DIAGNOSIS — M160 Bilateral primary osteoarthritis of hip: Secondary | ICD-10-CM | POA: Diagnosis not present

## 2016-06-17 ENCOUNTER — Telehealth: Payer: Self-pay | Admitting: Hematology and Oncology

## 2016-06-17 ENCOUNTER — Encounter: Payer: Self-pay | Admitting: Hematology and Oncology

## 2016-06-17 ENCOUNTER — Other Ambulatory Visit (HOSPITAL_BASED_OUTPATIENT_CLINIC_OR_DEPARTMENT_OTHER): Payer: Medicare Other

## 2016-06-17 ENCOUNTER — Ambulatory Visit (HOSPITAL_BASED_OUTPATIENT_CLINIC_OR_DEPARTMENT_OTHER): Payer: Medicare Other | Admitting: Hematology and Oncology

## 2016-06-17 VITALS — BP 127/63 | HR 75 | Temp 97.7°F | Resp 18 | Ht 64.0 in | Wt 155.7 lb

## 2016-06-17 DIAGNOSIS — Z8572 Personal history of non-Hodgkin lymphomas: Secondary | ICD-10-CM

## 2016-06-17 DIAGNOSIS — R2689 Other abnormalities of gait and mobility: Secondary | ICD-10-CM | POA: Diagnosis not present

## 2016-06-17 DIAGNOSIS — D631 Anemia in chronic kidney disease: Secondary | ICD-10-CM

## 2016-06-17 DIAGNOSIS — R2681 Unsteadiness on feet: Secondary | ICD-10-CM | POA: Diagnosis not present

## 2016-06-17 DIAGNOSIS — K7460 Unspecified cirrhosis of liver: Secondary | ICD-10-CM

## 2016-06-17 DIAGNOSIS — R293 Abnormal posture: Secondary | ICD-10-CM | POA: Diagnosis not present

## 2016-06-17 DIAGNOSIS — M6281 Muscle weakness (generalized): Secondary | ICD-10-CM | POA: Diagnosis not present

## 2016-06-17 DIAGNOSIS — R29898 Other symptoms and signs involving the musculoskeletal system: Secondary | ICD-10-CM | POA: Diagnosis not present

## 2016-06-17 DIAGNOSIS — N184 Chronic kidney disease, stage 4 (severe): Secondary | ICD-10-CM | POA: Diagnosis not present

## 2016-06-17 DIAGNOSIS — R748 Abnormal levels of other serum enzymes: Secondary | ICD-10-CM

## 2016-06-17 DIAGNOSIS — R740 Nonspecific elevation of levels of transaminase and lactic acid dehydrogenase [LDH]: Secondary | ICD-10-CM | POA: Diagnosis not present

## 2016-06-17 DIAGNOSIS — M160 Bilateral primary osteoarthritis of hip: Secondary | ICD-10-CM | POA: Diagnosis not present

## 2016-06-17 LAB — COMPREHENSIVE METABOLIC PANEL
ALBUMIN: 3.6 g/dL (ref 3.5–5.0)
ALK PHOS: 432 U/L — AB (ref 40–150)
ALT: 44 U/L (ref 0–55)
AST: 39 U/L — AB (ref 5–34)
Anion Gap: 10 mEq/L (ref 3–11)
BUN: 37.9 mg/dL — AB (ref 7.0–26.0)
CALCIUM: 9.2 mg/dL (ref 8.4–10.4)
CHLORIDE: 103 meq/L (ref 98–109)
CO2: 20 mEq/L — ABNORMAL LOW (ref 22–29)
Creatinine: 1.9 mg/dL — ABNORMAL HIGH (ref 0.7–1.3)
EGFR: 31 mL/min/{1.73_m2} — AB (ref >90)
Glucose: 103 mg/dl (ref 70–140)
POTASSIUM: 4.4 meq/L (ref 3.5–5.1)
SODIUM: 133 meq/L — AB (ref 136–145)
Total Bilirubin: 0.85 mg/dL (ref 0.20–1.20)
Total Protein: 7.2 g/dL (ref 6.4–8.3)

## 2016-06-17 LAB — CBC WITH DIFFERENTIAL/PLATELET
BASO%: 1.2 % (ref 0.0–2.0)
Basophils Absolute: 0.1 10*3/uL (ref 0.0–0.1)
EOS%: 7 % (ref 0.0–7.0)
Eosinophils Absolute: 0.7 10*3/uL — ABNORMAL HIGH (ref 0.0–0.5)
HEMATOCRIT: 35.1 % — AB (ref 38.4–49.9)
HGB: 11.9 g/dL — ABNORMAL LOW (ref 13.0–17.1)
LYMPH#: 1.3 10*3/uL (ref 0.9–3.3)
LYMPH%: 13.6 % — AB (ref 14.0–49.0)
MCH: 32 pg (ref 27.2–33.4)
MCHC: 33.8 g/dL (ref 32.0–36.0)
MCV: 94.5 fL (ref 79.3–98.0)
MONO#: 1.4 10*3/uL — AB (ref 0.1–0.9)
MONO%: 14.7 % — AB (ref 0.0–14.0)
NEUT#: 6.1 10*3/uL (ref 1.5–6.5)
NEUT%: 63.5 % (ref 39.0–75.0)
Platelets: 183 10*3/uL (ref 140–400)
RBC: 3.72 10*6/uL — ABNORMAL LOW (ref 4.20–5.82)
RDW: 14.3 % (ref 11.0–14.6)
WBC: 9.6 10*3/uL (ref 4.0–10.3)

## 2016-06-17 LAB — LACTATE DEHYDROGENASE: LDH: 185 U/L (ref 125–245)

## 2016-06-17 NOTE — Assessment & Plan Note (Signed)
The patient have background history liver cirrhosis with worsening liver enzymes abnormalities Recommend repeat CT to exclude lymphoma and possible exclude other form of liver cancer Bone healing cannot be excluded The agreed to proceed with CT imaging

## 2016-06-17 NOTE — Assessment & Plan Note (Signed)
I am concerned about abnormal liver enzymes in persistent anemia I am concerned about possible disease recurrence I recommend CT scan of the abdomen for further evaluation and he agreed to proceed. I will see him back in 2 weeks to review test results Due to his chronic renal failure, I will not administer IV contrast

## 2016-06-17 NOTE — Progress Notes (Signed)
[place] OFFICE PROGRESS NOTE  Patient Care Team: Cassandria Anger, MD as PCP - General Gatha Mayer, MD (Gastroenterology) Evans Lance, MD (Cardiology) Judeth Horn, MD (General Surgery) Danie Binder, MD as Consulting Physician (Gastroenterology) Heath Lark, MD as Consulting Physician (Hematology and Oncology)  SUMMARY OF ONCOLOGIC HISTORY:   History of B-cell lymphoma   04/30/2014 Imaging    Pan CT scan after an accident showed no evidence of disease      06/17/2014 Pathology Results    Accession: [id number] biopsy showed no evidence of cancer H Pylori      06/17/2014 Procedure    EGD showed diffuse gastritis      10/17/2014 Procedure    repeat EGD showed persistent stomach ulcer      10/17/2014 Pathology Results    Accession: [id number] 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      10/26/2014 - 11/02/2014 Hospital Admission    the patient was admitted for fevers and chills and was subsequently found to have bacteremia, resolved with IV anti-biotic therapy      10/26/2014 Imaging    CT scan of the chest show new pulmonary nodules of unknown etiology      10/31/2014 Imaging    CT scan of the abdomen show persistent liver cirrhosis but no regional lymphadenopathy      11/07/2014 Imaging    PET CT scan showed no other evidence of cancer involvement      11/09/2014 - 01/27/2015 Chemotherapy    he received weekly Rituxan x 4 then 2 more doses      02/07/2015 Imaging    PEt scan showed no evidence of disease      02/10/2015 Procedure    Repeat EGD showed no residual lymphoma. MILD Non-erosive gastritis       INTERVAL HISTORY: Please see below for problem oriented charting. He returns with multiple family members He has been getting a series of injection to his back for chronic back pain He denies bone pain There were no reported recent new lymphadenopathy Denies recent infection His energy level is stable No recent signs  of symptoms of congestive heart failure  REVIEW OF SYSTEMS:   Constitutional: Denies fevers, chills or abnormal weight loss Eyes: Denies blurriness of vision Ears, nose, mouth, throat, and face: Denies mucositis or sore throat Respiratory: Denies cough, dyspnea or wheezes Cardiovascular: Denies palpitation, chest discomfort or lower extremity swelling Gastrointestinal:  Denies nausea, heartburn or change in bowel habits Skin: Denies abnormal skin rashes Lymphatics: Denies new lymphadenopathy or easy bruising Neurological:Denies numbness, tingling or new weaknesses Behavioral/Psych: Mood is stable, no new changes  All other systems were reviewed with the patient and are negative.  I have reviewed the past medical history, past surgical history, social history and family history with the patient and they are unchanged from previous note.  ALLERGIES:  is allergic to aleve [naproxen sodium]; ibuprofen; and tramadol.  MEDICATIONS:  Current Outpatient Prescriptions  Medication Sig Dispense Refill  . acetaminophen (TYLENOL) 500 MG tablet Take 500 mg by mouth every 6 (six) hours as needed for headache.    . albuterol (PROVENTIL) (2.5 MG/3ML) 0.083% nebulizer solution USE ONE VIAL VIA NEBULIZER FOUR TIMES DAILY AS NEEDED. 375 mL 11  . AMBULATORY NON FORMULARY MEDICATION Diltiazem gel 2% Apply small amount pea-sized to anal area 2-3 times a day for anal stenosis. Use as needed. 30 g 5  . Artificial Tear Ointment (DRY EYES OP) Apply 1 drop to eye  daily.    . atorvastatin (LIPITOR) 20 MG tablet Take 0.5 tablets (10 mg total) by mouth 2 (two) times daily. 90 tablet 3  . diltiazem 2 % GEL Apply 1 application topically 3 (three) times daily. 30 g 11  . donepezil (ARICEPT) 10 MG tablet Take 1 tablet (10 mg total) by mouth at bedtime. 90 tablet 3  . fluticasone (FLONASE) 50 MCG/ACT nasal spray Place 2 sprays into both nostrils daily. 48 g 3  . folic acid (FOLVITE) 643 MCG tablet Take 400 mcg by mouth  daily.     Marland Kitchen levothyroxine (SYNTHROID, LEVOTHROID) 112 MCG tablet Take 1 tablet (112 mcg total) by mouth daily. 30 tablet 11  . multivitamin-iron-minerals-folic acid (CENTRUM) chewable tablet Chew 1 tablet by mouth daily.    . pantoprazole (PROTONIX) 40 MG tablet Take 1 tablet (40 mg total) by mouth daily. 90 tablet 3  . triamcinolone (NASACORT AQ) 55 MCG/ACT AERO nasal inhaler Place 2 sprays into the nose daily. 3 Inhaler 3  . triamcinolone ointment (KENALOG) 0.1 % Apply 1 application topically 2 (two) times daily. 80 g 2  . diphenoxylate-atropine (LOMOTIL) 2.5-0.025 MG tablet Take 1 tablet by mouth 4 (four) times daily as needed for diarrhea or loose stools. (Patient not taking: Reported on 06/17/2016) 60 tablet 1  . nitroGLYCERIN (NITROSTAT) 0.4 MG SL tablet Place 1 tablet (0.4 mg total) under the tongue every 5 (five) minutes as needed. Chest pain (Patient not taking: Reported on 06/17/2016) 20 tablet 1   No current facility-administered medications for this visit.     PHYSICAL EXAMINATION: ECOG PERFORMANCE STATUS: 2 - Symptomatic, <50% confined to bed  Vitals:   06/17/16 1355  BP: 127/63  Pulse: 75  Resp: 18  Temp: 97.7 F (36.5 C)   Filed Weights   06/17/16 1355  Weight: 155 lb 11.2 oz (70.6 kg)    GENERAL:alert, no distress and comfortable.  He looks elderly, in no acute distress SKIN: skin color, texture, turgor are normal, no rashes or significant lesions EYES: normal, Conjunctiva are pink and non-injected, sclera clear OROPHARYNX:no exudate, no erythema and lips, buccal mucosa, and tongue normal  NECK: supple, thyroid normal size, non-tender, without nodularity LYMPH:  no palpable lymphadenopathy in the cervical, axillary or inguinal LUNGS: clear to auscultation and percussion with normal breathing effort HEART: regular rate & rhythm and no murmurs and no lower extremity edema ABDOMEN:abdomen soft, non-tender and normal bowel sounds Musculoskeletal:no cyanosis of digits  and no clubbing  NEURO: alert & oriented x 3 with fluent speech, no focal motor/sensory deficits  LABORATORY DATA:  I have reviewed the data as listed    Component Value Date/Time   NA 133 (L) 06/17/2016 1339   K 4.4 06/17/2016 1339   CL 101 06/12/2016 1430   CO2 20 (L) 06/17/2016 1339   GLUCOSE 103 06/17/2016 1339   BUN 37.9 (H) 06/17/2016 1339   CREATININE 1.9 (H) 06/17/2016 1339   CALCIUM 9.2 06/17/2016 1339   PROT 7.2 06/17/2016 1339   ALBUMIN 3.6 06/17/2016 1339   AST 39 (H) 06/17/2016 1339   ALT 44 06/17/2016 1339   ALKPHOS 432 (H) 06/17/2016 1339   BILITOT 0.85 06/17/2016 1339   GFRNONAA 38 (L) 03/17/2016 1049   GFRAA 44 (L) 03/17/2016 1049    No results found for: SPEP, UPEP  Lab Results  Component Value Date   WBC 9.6 06/17/2016   NEUTROABS 6.1 06/17/2016   HGB 11.9 (L) 06/17/2016   HCT 35.1 (L) 06/17/2016   MCV  94.5 06/17/2016   PLT 183 06/17/2016      Chemistry      Component Value Date/Time   NA 133 (L) 06/17/2016 1339   K 4.4 06/17/2016 1339   CL 101 06/12/2016 1430   CO2 20 (L) 06/17/2016 1339   BUN 37.9 (H) 06/17/2016 1339   CREATININE 1.9 (H) 06/17/2016 1339      Component Value Date/Time   CALCIUM 9.2 06/17/2016 1339   ALKPHOS 432 (H) 06/17/2016 1339   AST 39 (H) 06/17/2016 1339   ALT 44 06/17/2016 1339   BILITOT 0.85 06/17/2016 1339       ASSESSMENT & PLAN:  History of B-cell lymphoma I am concerned about abnormal liver enzymes in persistent anemia I am concerned about possible disease recurrence I recommend CT scan of the abdomen for further evaluation and he agreed to proceed. I will see him back in 2 weeks to review test results Due to his chronic renal failure, I will not administer IV contrast  Elevated alkaline phosphatase level The patient have background history liver cirrhosis with worsening liver enzymes abnormalities Recommend repeat CT to exclude lymphoma and possible exclude other form of liver cancer Bone healing  cannot be excluded The agreed to proceed with CT imaging  Anemia in chronic renal disease This is likely anemia of chronic disease. The patient denies recent history of bleeding such as epistaxis, hematuria or hematochezia. He is asymptomatic from the anemia. We will observe for now.    Chronic kidney disease, stage IV (severe) ([place]) He has chronic renal failure, stage IV His creatinine fluctuated. He will continue aggressive medical management and he follows with nephrologist He will not get IV contrast with CT   Orders Placed This Encounter  Procedures  . CT Abdomen Wo Contrast    Standing Status:   Future    Standing Expiration Date:   06/17/2017    Order Specific Question:   Reason for Exam (SYMPTOM  OR DIAGNOSIS REQUIRED)    Answer:   abnormal liver enzymes, liver corrhosis, hx lymphoma    Order Specific Question:   Preferred imaging location?    Answer:   [facility]    Order Specific Question:   Radiology Contrast Protocol - do NOT remove file path    Answer:   [email or web address]   All questions were answered. The patient knows to call the clinic with any problems, questions or concerns. No barriers to learning was detected. I spent 20 minutes counseling the patient face to face. The total time spent in the appointment was 25 minutes and more than 50% was on counseling and review of test results     Heath Lark, MD 06/17/2016 2:40 PM

## 2016-06-17 NOTE — Telephone Encounter (Signed)
Gave patient avs report and appointments for April  °

## 2016-06-17 NOTE — Assessment & Plan Note (Signed)
This is likely anemia of chronic disease. The patient denies recent history of bleeding such as epistaxis, hematuria or hematochezia. He is asymptomatic from the anemia. We will observe for now.  

## 2016-06-17 NOTE — Assessment & Plan Note (Signed)
He has chronic renal failure, stage IV His creatinine fluctuated. He will continue aggressive medical management and he follows with nephrologist He will not get IV contrast with CT

## 2016-06-18 ENCOUNTER — Telehealth: Payer: Self-pay | Admitting: Internal Medicine

## 2016-06-18 DIAGNOSIS — R293 Abnormal posture: Secondary | ICD-10-CM | POA: Diagnosis not present

## 2016-06-18 DIAGNOSIS — R2681 Unsteadiness on feet: Secondary | ICD-10-CM | POA: Diagnosis not present

## 2016-06-18 DIAGNOSIS — M160 Bilateral primary osteoarthritis of hip: Secondary | ICD-10-CM | POA: Diagnosis not present

## 2016-06-18 DIAGNOSIS — R2689 Other abnormalities of gait and mobility: Secondary | ICD-10-CM | POA: Diagnosis not present

## 2016-06-18 DIAGNOSIS — R29898 Other symptoms and signs involving the musculoskeletal system: Secondary | ICD-10-CM | POA: Diagnosis not present

## 2016-06-18 DIAGNOSIS — M6281 Muscle weakness (generalized): Secondary | ICD-10-CM | POA: Diagnosis not present

## 2016-06-18 NOTE — Telephone Encounter (Signed)
Spoke with patient's daughter regarding awv. Patient is not interested in scheduling awv at this time.

## 2016-06-20 DIAGNOSIS — R2689 Other abnormalities of gait and mobility: Secondary | ICD-10-CM | POA: Diagnosis not present

## 2016-06-20 DIAGNOSIS — M6281 Muscle weakness (generalized): Secondary | ICD-10-CM | POA: Diagnosis not present

## 2016-06-20 DIAGNOSIS — M160 Bilateral primary osteoarthritis of hip: Secondary | ICD-10-CM | POA: Diagnosis not present

## 2016-06-20 DIAGNOSIS — R29898 Other symptoms and signs involving the musculoskeletal system: Secondary | ICD-10-CM | POA: Diagnosis not present

## 2016-06-20 DIAGNOSIS — R293 Abnormal posture: Secondary | ICD-10-CM | POA: Diagnosis not present

## 2016-06-20 DIAGNOSIS — R2681 Unsteadiness on feet: Secondary | ICD-10-CM | POA: Diagnosis not present

## 2016-06-24 DIAGNOSIS — R293 Abnormal posture: Secondary | ICD-10-CM | POA: Diagnosis not present

## 2016-06-24 DIAGNOSIS — R2681 Unsteadiness on feet: Secondary | ICD-10-CM | POA: Diagnosis not present

## 2016-06-24 DIAGNOSIS — R29898 Other symptoms and signs involving the musculoskeletal system: Secondary | ICD-10-CM | POA: Diagnosis not present

## 2016-06-24 DIAGNOSIS — M160 Bilateral primary osteoarthritis of hip: Secondary | ICD-10-CM | POA: Diagnosis not present

## 2016-06-24 DIAGNOSIS — R2689 Other abnormalities of gait and mobility: Secondary | ICD-10-CM | POA: Diagnosis not present

## 2016-06-24 DIAGNOSIS — M6281 Muscle weakness (generalized): Secondary | ICD-10-CM | POA: Diagnosis not present

## 2016-06-25 DIAGNOSIS — R2689 Other abnormalities of gait and mobility: Secondary | ICD-10-CM | POA: Diagnosis not present

## 2016-06-25 DIAGNOSIS — R293 Abnormal posture: Secondary | ICD-10-CM | POA: Diagnosis not present

## 2016-06-25 DIAGNOSIS — M6281 Muscle weakness (generalized): Secondary | ICD-10-CM | POA: Diagnosis not present

## 2016-06-25 DIAGNOSIS — R2681 Unsteadiness on feet: Secondary | ICD-10-CM | POA: Diagnosis not present

## 2016-06-25 DIAGNOSIS — M160 Bilateral primary osteoarthritis of hip: Secondary | ICD-10-CM | POA: Diagnosis not present

## 2016-06-25 DIAGNOSIS — R29898 Other symptoms and signs involving the musculoskeletal system: Secondary | ICD-10-CM | POA: Diagnosis not present

## 2016-07-01 ENCOUNTER — Ambulatory Visit (HOSPITAL_COMMUNITY)
Admission: RE | Admit: 2016-07-01 | Discharge: 2016-07-01 | Disposition: A | Discharge status: Home / Self Care | Payer: Medicare Other | Source: Ambulatory Visit | Attending: Hematology and Oncology | Admitting: Hematology and Oncology

## 2016-07-01 DIAGNOSIS — R748 Abnormal levels of other serum enzymes: Secondary | ICD-10-CM | POA: Diagnosis not present

## 2016-07-01 DIAGNOSIS — Z8572 Personal history of non-Hodgkin lymphomas: Secondary | ICD-10-CM | POA: Insufficient documentation

## 2016-07-01 DIAGNOSIS — K7460 Unspecified cirrhosis of liver: Secondary | ICD-10-CM | POA: Diagnosis not present

## 2016-07-01 DIAGNOSIS — K8020 Calculus of gallbladder without cholecystitis without obstruction: Secondary | ICD-10-CM | POA: Diagnosis not present

## 2016-07-01 DIAGNOSIS — Q613 Polycystic kidney, unspecified: Secondary | ICD-10-CM | POA: Insufficient documentation

## 2016-07-01 DIAGNOSIS — R918 Other nonspecific abnormal finding of lung field: Secondary | ICD-10-CM | POA: Insufficient documentation

## 2016-07-01 DIAGNOSIS — I700 Atherosclerosis of aorta: Secondary | ICD-10-CM | POA: Diagnosis not present

## 2016-07-01 DIAGNOSIS — I2510 Atherosclerotic heart disease of native coronary artery without angina pectoris: Secondary | ICD-10-CM | POA: Diagnosis not present

## 2016-07-02 ENCOUNTER — Telehealth: Payer: Self-pay | Admitting: Hematology and Oncology

## 2016-07-02 ENCOUNTER — Encounter: Payer: Self-pay | Admitting: Hematology and Oncology

## 2016-07-02 ENCOUNTER — Ambulatory Visit (HOSPITAL_BASED_OUTPATIENT_CLINIC_OR_DEPARTMENT_OTHER): Payer: Medicare Other | Admitting: Hematology and Oncology

## 2016-07-02 DIAGNOSIS — R293 Abnormal posture: Secondary | ICD-10-CM | POA: Diagnosis not present

## 2016-07-02 DIAGNOSIS — Z8572 Personal history of non-Hodgkin lymphomas: Secondary | ICD-10-CM | POA: Diagnosis not present

## 2016-07-02 DIAGNOSIS — R29898 Other symptoms and signs involving the musculoskeletal system: Secondary | ICD-10-CM | POA: Diagnosis not present

## 2016-07-02 DIAGNOSIS — M6281 Muscle weakness (generalized): Secondary | ICD-10-CM | POA: Diagnosis not present

## 2016-07-02 DIAGNOSIS — N184 Chronic kidney disease, stage 4 (severe): Secondary | ICD-10-CM

## 2016-07-02 DIAGNOSIS — R2689 Other abnormalities of gait and mobility: Secondary | ICD-10-CM | POA: Diagnosis not present

## 2016-07-02 DIAGNOSIS — M160 Bilateral primary osteoarthritis of hip: Secondary | ICD-10-CM | POA: Diagnosis not present

## 2016-07-02 DIAGNOSIS — R2681 Unsteadiness on feet: Secondary | ICD-10-CM | POA: Diagnosis not present

## 2016-07-02 DIAGNOSIS — K7460 Unspecified cirrhosis of liver: Secondary | ICD-10-CM | POA: Diagnosis not present

## 2016-07-02 NOTE — Progress Notes (Signed)
[place] OFFICE PROGRESS NOTE  Patient Care Team: Cassandria Anger, MD as PCP - General Gatha Mayer, MD (Gastroenterology) Evans Lance, MD (Cardiology) Judeth Horn, MD (General Surgery) Danie Binder, MD as Consulting Physician (Gastroenterology) Heath Lark, MD as Consulting Physician (Hematology and Oncology)  SUMMARY OF ONCOLOGIC HISTORY:   History of B-cell lymphoma   04/30/2014 Imaging    Pan CT scan after an accident showed no evidence of disease      06/17/2014 Pathology Results    Accession: [id number] biopsy showed no evidence of cancer H Pylori      06/17/2014 Procedure    EGD showed diffuse gastritis      10/17/2014 Procedure    repeat EGD showed persistent stomach ulcer      10/17/2014 Pathology Results    Accession: [id number] 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      10/26/2014 - 11/02/2014 Hospital Admission    the patient was admitted for fevers and chills and was subsequently found to have bacteremia, resolved with IV anti-biotic therapy      10/26/2014 Imaging    CT scan of the chest show new pulmonary nodules of unknown etiology      10/31/2014 Imaging    CT scan of the abdomen show persistent liver cirrhosis but no regional lymphadenopathy      11/07/2014 Imaging    PET CT scan showed no other evidence of cancer involvement      11/09/2014 - 01/27/2015 Chemotherapy    he received weekly Rituxan x 4 then 2 more doses      02/07/2015 Imaging    PEt scan showed no evidence of disease      02/10/2015 Procedure    Repeat EGD showed no residual lymphoma. MILD Non-erosive gastritis      07/01/2016 Imaging    Ct scan: No acute findings. Cirrhosis. Cholelithiasis. Polycystic kidneys. Further characterization is limited without post-contrast imaging. 5. Aortic atherosclerosis (ICD10-170.0). Coronary artery calcification.       INTERVAL HISTORY: Please see below for problem oriented charting. He returns for  further follow-up and review of test results He feels well.  He is doing aggressive physical therapy.  No recent falls  REVIEW OF SYSTEMS:   Constitutional: Denies fevers, chills or abnormal weight loss Eyes: Denies blurriness of vision Ears, nose, mouth, throat, and face: Denies mucositis or sore throat Respiratory: Denies cough, dyspnea or wheezes Cardiovascular: Denies palpitation, chest discomfort or lower extremity swelling Gastrointestinal:  Denies nausea, heartburn or change in bowel habits Skin: Denies abnormal skin rashes Lymphatics: Denies new lymphadenopathy or easy bruising Neurological:Denies numbness, tingling or new weaknesses Behavioral/Psych: Mood is stable, no new changes  All other systems were reviewed with the patient and are negative.  I have reviewed the past medical history, past surgical history, social history and family history with the patient and they are unchanged from previous note.  ALLERGIES:  is allergic to aleve [naproxen sodium]; ibuprofen; and tramadol.  MEDICATIONS:  Current Outpatient Prescriptions  Medication Sig Dispense Refill  . acetaminophen (TYLENOL) 500 MG tablet Take 500 mg by mouth every 6 (six) hours as needed for headache.    . albuterol (PROVENTIL) (2.5 MG/3ML) 0.083% nebulizer solution USE ONE VIAL VIA NEBULIZER FOUR TIMES DAILY AS NEEDED. 375 mL 11  . AMBULATORY NON FORMULARY MEDICATION Diltiazem gel 2% Apply small amount pea-sized to anal area 2-3 times a day for anal stenosis. Use as needed. 30 g 5  . Artificial Tear Ointment (  DRY EYES OP) Apply 1 drop to eye daily.    Marland Kitchen atorvastatin (LIPITOR) 20 MG tablet Take 0.5 tablets (10 mg total) by mouth 2 (two) times daily. 90 tablet 3  . diltiazem 2 % GEL Apply 1 application topically 3 (three) times daily. 30 g 11  . diphenoxylate-atropine (LOMOTIL) 2.5-0.025 MG tablet Take 1 tablet by mouth 4 (four) times daily as needed for diarrhea or loose stools. (Patient not taking: Reported on  06/17/2016) 60 tablet 1  . donepezil (ARICEPT) 10 MG tablet Take 1 tablet (10 mg total) by mouth at bedtime. 90 tablet 3  . fluticasone (FLONASE) 50 MCG/ACT nasal spray Place 2 sprays into both nostrils daily. 48 g 3  . folic acid (FOLVITE) 629 MCG tablet Take 400 mcg by mouth daily.     Marland Kitchen levothyroxine (SYNTHROID, LEVOTHROID) 112 MCG tablet Take 1 tablet (112 mcg total) by mouth daily. 30 tablet 11  . multivitamin-iron-minerals-folic acid (CENTRUM) chewable tablet Chew 1 tablet by mouth daily.    . nitroGLYCERIN (NITROSTAT) 0.4 MG SL tablet Place 1 tablet (0.4 mg total) under the tongue every 5 (five) minutes as needed. Chest pain (Patient not taking: Reported on 06/17/2016) 20 tablet 1  . pantoprazole (PROTONIX) 40 MG tablet Take 1 tablet (40 mg total) by mouth daily. 90 tablet 3  . triamcinolone (NASACORT AQ) 55 MCG/ACT AERO nasal inhaler Place 2 sprays into the nose daily. 3 Inhaler 3  . triamcinolone ointment (KENALOG) 0.1 % Apply 1 application topically 2 (two) times daily. 80 g 2   No current facility-administered medications for this visit.     PHYSICAL EXAMINATION: ECOG PERFORMANCE STATUS: 1 - Symptomatic but completely ambulatory GENERAL:alert, no distress and comfortable SKIN: skin color, texture, turgor are normal, no rashes or significant lesions EYES: normal, Conjunctiva are pink and non-injected, sclera clear Musculoskeletal:no cyanosis of digits and no clubbing  NEURO: alert & oriented x 3 with fluent speech, no focal motor/sensory deficits  LABORATORY DATA:  I have reviewed the data as listed    Component Value Date/Time   NA 133 (L) 06/17/2016 1339   K 4.4 06/17/2016 1339   CL 101 06/12/2016 1430   CO2 20 (L) 06/17/2016 1339   GLUCOSE 103 06/17/2016 1339   BUN 37.9 (H) 06/17/2016 1339   CREATININE 1.9 (H) 06/17/2016 1339   CALCIUM 9.2 06/17/2016 1339   PROT 7.2 06/17/2016 1339   ALBUMIN 3.6 06/17/2016 1339   AST 39 (H) 06/17/2016 1339   ALT 44 06/17/2016 1339    ALKPHOS 432 (H) 06/17/2016 1339   BILITOT 0.85 06/17/2016 1339   GFRNONAA 38 (L) 03/17/2016 1049   GFRAA 44 (L) 03/17/2016 1049    No results found for: SPEP, UPEP  Lab Results  Component Value Date   WBC 9.6 06/17/2016   NEUTROABS 6.1 06/17/2016   HGB 11.9 (L) 06/17/2016   HCT 35.1 (L) 06/17/2016   MCV 94.5 06/17/2016   PLT 183 06/17/2016      Chemistry      Component Value Date/Time   NA 133 (L) 06/17/2016 1339   K 4.4 06/17/2016 1339   CL 101 06/12/2016 1430   CO2 20 (L) 06/17/2016 1339   BUN 37.9 (H) 06/17/2016 1339   CREATININE 1.9 (H) 06/17/2016 1339      Component Value Date/Time   CALCIUM 9.2 06/17/2016 1339   ALKPHOS 432 (H) 06/17/2016 1339   AST 39 (H) 06/17/2016 1339   ALT 44 06/17/2016 1339   BILITOT 0.85 06/17/2016 1339  RADIOGRAPHIC STUDIES: I have personally reviewed the radiological images as listed and agreed with the findings in the report. Ct Abdomen Wo Contrast  Result Date: 07/01/2016 CLINICAL DATA:  Elevated alkaline phosphatase, anemia, cirrhosis. Lymphoma. EXAM: CT ABDOMEN WITHOUT CONTRAST TECHNIQUE: Multidetector CT imaging of the abdomen was performed following the standard protocol without IV contrast. COMPARISON:  PET 02/07/2015 and CT abdomen pelvis 10/31/2014. FINDINGS: Lower chest: Mild nonspecific coarsening in the lung bases. Calcified granuloma in the right lower lobe. Calcified pleural plaque in the lower right hemithorax. Heart size normal. Coronary artery calcification. No pericardial or pleural effusion. Hepatobiliary: Liver margin is markedly irregular. Liver is otherwise grossly unremarkable. Stones are seen in the gallbladder. No biliary ductal dilatation. Pancreas: Negative. Spleen: Negative. Adrenals/Urinary Tract: Adrenal glands are unremarkable. Multiple low-attenuation lesions are seen throughout the kidneys, measuring up to 9.8 cm off the lower pole right kidney, as before. Some have associated thin peripheral or septal  calcification, indicative of mild complexity. Additional characterization is limited without post-contrast imaging. Stomach/Bowel: Stomach is unremarkable. Proximal duodenal diverticulum is incidentally noted. Visualized portions of the small bowel and colon are otherwise unremarkable. Vascular/Lymphatic: Atherosclerotic calcification of the aorta without aneurysm. Porta hepatis and retroperitoneal lymph nodes are not enlarged by CT size criteria. Other: No free fluid.  Mesenteries and peritoneum are unremarkable. Musculoskeletal: No worrisome lytic or sclerotic lesions. Degenerative changes are seen in the spine date lower thoracic vertebroplasty. IMPRESSION: 1. No acute findings. 2. Cirrhosis. 3. Cholelithiasis. 4. Polycystic kidneys. Further characterization is limited without post-contrast imaging. 5. Aortic atherosclerosis (ICD10-170.0). Coronary artery calcification. Electronically Signed   By: Lorin Picket M.D.   On: 07/01/2016 15:54    ASSESSMENT & PLAN:  History of B-cell lymphoma CT scan of the abdomen from 4//18 showed no evidence of recurrence I will see him back in 1 year with repeat H&P and labs  Chronic kidney disease, stage IV (severe) ([place]) He has chronic renal failure, stage IV His creatinine fluctuated. He will continue aggressive medical management and he follows with nephrologist Due to polycystic kidney disease He will not get IV contrast with CT  Liver cirrhosis ([place]) He has history of cirrhosis of the liver. CT scan show no abnormal changes. The elevated alkaline phosphatase could be related to this or bone related elevated enzymes.  Recommend observation only   No orders of the defined types were placed in this encounter.  All questions were answered. The patient knows to call the clinic with any problems, questions or concerns. No barriers to learning was detected. I spent 10 minutes counseling the patient face to face. The total time spent in the appointment was  15 minutes and more than 50% was on counseling and review of test results     Heath Lark, MD 07/02/2016 12:25 PM

## 2016-07-02 NOTE — Assessment & Plan Note (Signed)
He has history of cirrhosis of the liver. CT scan show no abnormal changes. The elevated alkaline phosphatase could be related to this or bone related elevated enzymes.  Recommend observation only

## 2016-07-02 NOTE — Assessment & Plan Note (Signed)
He has chronic renal failure, stage IV His creatinine fluctuated. He will continue aggressive medical management and he follows with nephrologist Due to polycystic kidney disease He will not get IV contrast with CT

## 2016-07-02 NOTE — Assessment & Plan Note (Signed)
CT scan of the abdomen from 4//18 showed no evidence of recurrence I will see him back in 1 year with repeat H&P and labs

## 2016-07-02 NOTE — Telephone Encounter (Signed)
Gave patient AVS and calenders per 4/10 los.

## 2016-07-04 DIAGNOSIS — R2689 Other abnormalities of gait and mobility: Secondary | ICD-10-CM | POA: Diagnosis not present

## 2016-07-04 DIAGNOSIS — R29898 Other symptoms and signs involving the musculoskeletal system: Secondary | ICD-10-CM | POA: Diagnosis not present

## 2016-07-04 DIAGNOSIS — M6281 Muscle weakness (generalized): Secondary | ICD-10-CM | POA: Diagnosis not present

## 2016-07-04 DIAGNOSIS — M160 Bilateral primary osteoarthritis of hip: Secondary | ICD-10-CM | POA: Diagnosis not present

## 2016-07-04 DIAGNOSIS — R2681 Unsteadiness on feet: Secondary | ICD-10-CM | POA: Diagnosis not present

## 2016-07-04 DIAGNOSIS — R293 Abnormal posture: Secondary | ICD-10-CM | POA: Diagnosis not present

## 2016-07-11 DIAGNOSIS — M6281 Muscle weakness (generalized): Secondary | ICD-10-CM | POA: Diagnosis not present

## 2016-07-11 DIAGNOSIS — R2681 Unsteadiness on feet: Secondary | ICD-10-CM | POA: Diagnosis not present

## 2016-07-11 DIAGNOSIS — M160 Bilateral primary osteoarthritis of hip: Secondary | ICD-10-CM | POA: Diagnosis not present

## 2016-07-11 DIAGNOSIS — R29898 Other symptoms and signs involving the musculoskeletal system: Secondary | ICD-10-CM | POA: Diagnosis not present

## 2016-07-11 DIAGNOSIS — R293 Abnormal posture: Secondary | ICD-10-CM | POA: Diagnosis not present

## 2016-07-11 DIAGNOSIS — R2689 Other abnormalities of gait and mobility: Secondary | ICD-10-CM | POA: Diagnosis not present

## 2016-07-12 DIAGNOSIS — M160 Bilateral primary osteoarthritis of hip: Secondary | ICD-10-CM | POA: Diagnosis not present

## 2016-07-12 DIAGNOSIS — R2681 Unsteadiness on feet: Secondary | ICD-10-CM | POA: Diagnosis not present

## 2016-07-12 DIAGNOSIS — M6281 Muscle weakness (generalized): Secondary | ICD-10-CM | POA: Diagnosis not present

## 2016-07-12 DIAGNOSIS — R29898 Other symptoms and signs involving the musculoskeletal system: Secondary | ICD-10-CM | POA: Diagnosis not present

## 2016-07-12 DIAGNOSIS — R293 Abnormal posture: Secondary | ICD-10-CM | POA: Diagnosis not present

## 2016-07-12 DIAGNOSIS — R2689 Other abnormalities of gait and mobility: Secondary | ICD-10-CM | POA: Diagnosis not present

## 2016-07-18 DIAGNOSIS — R293 Abnormal posture: Secondary | ICD-10-CM | POA: Diagnosis not present

## 2016-07-18 DIAGNOSIS — M6281 Muscle weakness (generalized): Secondary | ICD-10-CM | POA: Diagnosis not present

## 2016-07-18 DIAGNOSIS — M160 Bilateral primary osteoarthritis of hip: Secondary | ICD-10-CM | POA: Diagnosis not present

## 2016-07-18 DIAGNOSIS — R29898 Other symptoms and signs involving the musculoskeletal system: Secondary | ICD-10-CM | POA: Diagnosis not present

## 2016-07-18 DIAGNOSIS — R2689 Other abnormalities of gait and mobility: Secondary | ICD-10-CM | POA: Diagnosis not present

## 2016-07-18 DIAGNOSIS — R2681 Unsteadiness on feet: Secondary | ICD-10-CM | POA: Diagnosis not present

## 2016-07-22 DIAGNOSIS — R2681 Unsteadiness on feet: Secondary | ICD-10-CM | POA: Diagnosis not present

## 2016-07-22 DIAGNOSIS — M6281 Muscle weakness (generalized): Secondary | ICD-10-CM | POA: Diagnosis not present

## 2016-07-22 DIAGNOSIS — R2689 Other abnormalities of gait and mobility: Secondary | ICD-10-CM | POA: Diagnosis not present

## 2016-07-22 DIAGNOSIS — R29898 Other symptoms and signs involving the musculoskeletal system: Secondary | ICD-10-CM | POA: Diagnosis not present

## 2016-07-22 DIAGNOSIS — M160 Bilateral primary osteoarthritis of hip: Secondary | ICD-10-CM | POA: Diagnosis not present

## 2016-07-22 DIAGNOSIS — R293 Abnormal posture: Secondary | ICD-10-CM | POA: Diagnosis not present

## 2016-07-25 DIAGNOSIS — M160 Bilateral primary osteoarthritis of hip: Secondary | ICD-10-CM | POA: Diagnosis not present

## 2016-07-25 DIAGNOSIS — R2689 Other abnormalities of gait and mobility: Secondary | ICD-10-CM | POA: Diagnosis not present

## 2016-07-25 DIAGNOSIS — M6281 Muscle weakness (generalized): Secondary | ICD-10-CM | POA: Diagnosis not present

## 2016-07-25 DIAGNOSIS — R2681 Unsteadiness on feet: Secondary | ICD-10-CM | POA: Diagnosis not present

## 2016-07-30 ENCOUNTER — Other Ambulatory Visit: Payer: Self-pay | Admitting: General Practice

## 2016-07-30 DIAGNOSIS — M6281 Muscle weakness (generalized): Secondary | ICD-10-CM | POA: Diagnosis not present

## 2016-07-30 DIAGNOSIS — M160 Bilateral primary osteoarthritis of hip: Secondary | ICD-10-CM | POA: Diagnosis not present

## 2016-07-30 DIAGNOSIS — R2689 Other abnormalities of gait and mobility: Secondary | ICD-10-CM | POA: Diagnosis not present

## 2016-07-30 DIAGNOSIS — R2681 Unsteadiness on feet: Secondary | ICD-10-CM | POA: Diagnosis not present

## 2016-07-30 MED ORDER — PANTOPRAZOLE SODIUM 40 MG PO TBEC: 40.0000 mg | DELAYED_RELEASE_TABLET | Freq: Every day | ORAL | 0 refills | Status: DC

## 2016-08-01 DIAGNOSIS — M6281 Muscle weakness (generalized): Secondary | ICD-10-CM | POA: Diagnosis not present

## 2016-08-01 DIAGNOSIS — R2681 Unsteadiness on feet: Secondary | ICD-10-CM | POA: Diagnosis not present

## 2016-08-01 DIAGNOSIS — R2689 Other abnormalities of gait and mobility: Secondary | ICD-10-CM | POA: Diagnosis not present

## 2016-08-01 DIAGNOSIS — M160 Bilateral primary osteoarthritis of hip: Secondary | ICD-10-CM | POA: Diagnosis not present

## 2016-08-06 DIAGNOSIS — M6281 Muscle weakness (generalized): Secondary | ICD-10-CM | POA: Diagnosis not present

## 2016-08-06 DIAGNOSIS — R2681 Unsteadiness on feet: Secondary | ICD-10-CM | POA: Diagnosis not present

## 2016-08-06 DIAGNOSIS — M160 Bilateral primary osteoarthritis of hip: Secondary | ICD-10-CM | POA: Diagnosis not present

## 2016-08-06 DIAGNOSIS — R2689 Other abnormalities of gait and mobility: Secondary | ICD-10-CM | POA: Diagnosis not present

## 2016-08-08 DIAGNOSIS — R2681 Unsteadiness on feet: Secondary | ICD-10-CM | POA: Diagnosis not present

## 2016-08-08 DIAGNOSIS — M160 Bilateral primary osteoarthritis of hip: Secondary | ICD-10-CM | POA: Diagnosis not present

## 2016-08-08 DIAGNOSIS — R2689 Other abnormalities of gait and mobility: Secondary | ICD-10-CM | POA: Diagnosis not present

## 2016-08-08 DIAGNOSIS — M6281 Muscle weakness (generalized): Secondary | ICD-10-CM | POA: Diagnosis not present

## 2016-08-09 DIAGNOSIS — M5117 Intervertebral disc disorders with radiculopathy, lumbosacral region: Secondary | ICD-10-CM | POA: Diagnosis not present

## 2016-08-09 DIAGNOSIS — M5416 Radiculopathy, lumbar region: Secondary | ICD-10-CM | POA: Diagnosis not present

## 2016-08-09 DIAGNOSIS — M5136 Other intervertebral disc degeneration, lumbar region: Secondary | ICD-10-CM | POA: Diagnosis not present

## 2016-08-13 DIAGNOSIS — M160 Bilateral primary osteoarthritis of hip: Secondary | ICD-10-CM | POA: Diagnosis not present

## 2016-08-13 DIAGNOSIS — R2681 Unsteadiness on feet: Secondary | ICD-10-CM | POA: Diagnosis not present

## 2016-08-13 DIAGNOSIS — R2689 Other abnormalities of gait and mobility: Secondary | ICD-10-CM | POA: Diagnosis not present

## 2016-08-13 DIAGNOSIS — M6281 Muscle weakness (generalized): Secondary | ICD-10-CM | POA: Diagnosis not present

## 2016-08-15 DIAGNOSIS — M6281 Muscle weakness (generalized): Secondary | ICD-10-CM | POA: Diagnosis not present

## 2016-08-15 DIAGNOSIS — R2681 Unsteadiness on feet: Secondary | ICD-10-CM | POA: Diagnosis not present

## 2016-08-15 DIAGNOSIS — M160 Bilateral primary osteoarthritis of hip: Secondary | ICD-10-CM | POA: Diagnosis not present

## 2016-08-15 DIAGNOSIS — R2689 Other abnormalities of gait and mobility: Secondary | ICD-10-CM | POA: Diagnosis not present

## 2016-08-20 DIAGNOSIS — R2689 Other abnormalities of gait and mobility: Secondary | ICD-10-CM | POA: Diagnosis not present

## 2016-08-20 DIAGNOSIS — M6281 Muscle weakness (generalized): Secondary | ICD-10-CM | POA: Diagnosis not present

## 2016-08-20 DIAGNOSIS — M160 Bilateral primary osteoarthritis of hip: Secondary | ICD-10-CM | POA: Diagnosis not present

## 2016-08-20 DIAGNOSIS — R2681 Unsteadiness on feet: Secondary | ICD-10-CM | POA: Diagnosis not present

## 2016-08-22 DIAGNOSIS — R2681 Unsteadiness on feet: Secondary | ICD-10-CM | POA: Diagnosis not present

## 2016-08-22 DIAGNOSIS — R2689 Other abnormalities of gait and mobility: Secondary | ICD-10-CM | POA: Diagnosis not present

## 2016-08-22 DIAGNOSIS — M160 Bilateral primary osteoarthritis of hip: Secondary | ICD-10-CM | POA: Diagnosis not present

## 2016-08-22 DIAGNOSIS — M6281 Muscle weakness (generalized): Secondary | ICD-10-CM | POA: Diagnosis not present

## 2016-08-26 ENCOUNTER — Telehealth: Payer: Self-pay | Admitting: Cardiology

## 2016-08-26 ENCOUNTER — Ambulatory Visit (INDEPENDENT_AMBULATORY_CARE_PROVIDER_SITE_OTHER): Payer: Medicare Other | Admitting: *Deleted

## 2016-08-26 DIAGNOSIS — R2689 Other abnormalities of gait and mobility: Secondary | ICD-10-CM | POA: Diagnosis not present

## 2016-08-26 DIAGNOSIS — M6281 Muscle weakness (generalized): Secondary | ICD-10-CM | POA: Diagnosis not present

## 2016-08-26 DIAGNOSIS — I495 Sick sinus syndrome: Secondary | ICD-10-CM | POA: Diagnosis not present

## 2016-08-26 DIAGNOSIS — M160 Bilateral primary osteoarthritis of hip: Secondary | ICD-10-CM | POA: Diagnosis not present

## 2016-08-26 DIAGNOSIS — R2681 Unsteadiness on feet: Secondary | ICD-10-CM | POA: Diagnosis not present

## 2016-08-26 NOTE — Progress Notes (Signed)
Remote pacemaker transmission.   

## 2016-08-26 NOTE — Telephone Encounter (Signed)
LMOVM reminding pt to send remote transmission.   

## 2016-08-27 LAB — CUP PACEART REMOTE DEVICE CHECK
Battery Voltage: 2.78 V
Brady Statistic AP VS Percent: 91 %
Brady Statistic AS VS Percent: 8 %
Date Time Interrogation Session: 20180604140925
Implantable Lead Implant Date: 20031204
Implantable Lead Implant Date: 20031204
Implantable Lead Location: 753859
Lead Channel Impedance Value: 463 Ohm
Lead Channel Pacing Threshold Pulse Width: 0.4 ms
Lead Channel Setting Pacing Amplitude: 2 V
Lead Channel Setting Pacing Amplitude: 2.5 V
Lead Channel Setting Pacing Pulse Width: 0.4 ms
Lead Channel Setting Sensing Sensitivity: 5.6 mV
MDC IDC LEAD LOCATION: 753860
MDC IDC MSMT BATTERY IMPEDANCE: 108 Ohm
MDC IDC MSMT BATTERY REMAINING LONGEVITY: 142 mo
MDC IDC MSMT LEADCHNL RA IMPEDANCE VALUE: 505 Ohm
MDC IDC MSMT LEADCHNL RA PACING THRESHOLD AMPLITUDE: 0.625 V
MDC IDC MSMT LEADCHNL RA PACING THRESHOLD PULSEWIDTH: 0.4 ms
MDC IDC MSMT LEADCHNL RV PACING THRESHOLD AMPLITUDE: 0.875 V
MDC IDC MSMT LEADCHNL RV SENSING INTR AMPL: 16 mV
MDC IDC PG IMPLANT DT: 20170320
MDC IDC STAT BRADY AP VP PERCENT: 1 %
MDC IDC STAT BRADY AS VP PERCENT: 0 %

## 2016-08-30 DIAGNOSIS — M160 Bilateral primary osteoarthritis of hip: Secondary | ICD-10-CM | POA: Diagnosis not present

## 2016-08-30 DIAGNOSIS — R2681 Unsteadiness on feet: Secondary | ICD-10-CM | POA: Diagnosis not present

## 2016-08-30 DIAGNOSIS — M6281 Muscle weakness (generalized): Secondary | ICD-10-CM | POA: Diagnosis not present

## 2016-08-30 DIAGNOSIS — R2689 Other abnormalities of gait and mobility: Secondary | ICD-10-CM | POA: Diagnosis not present

## 2016-09-02 DIAGNOSIS — R2689 Other abnormalities of gait and mobility: Secondary | ICD-10-CM | POA: Diagnosis not present

## 2016-09-02 DIAGNOSIS — R2681 Unsteadiness on feet: Secondary | ICD-10-CM | POA: Diagnosis not present

## 2016-09-02 DIAGNOSIS — M6281 Muscle weakness (generalized): Secondary | ICD-10-CM | POA: Diagnosis not present

## 2016-09-02 DIAGNOSIS — M160 Bilateral primary osteoarthritis of hip: Secondary | ICD-10-CM | POA: Diagnosis not present

## 2016-09-03 ENCOUNTER — Encounter: Payer: Self-pay | Admitting: Cardiology

## 2016-09-05 DIAGNOSIS — M6281 Muscle weakness (generalized): Secondary | ICD-10-CM | POA: Diagnosis not present

## 2016-09-05 DIAGNOSIS — M160 Bilateral primary osteoarthritis of hip: Secondary | ICD-10-CM | POA: Diagnosis not present

## 2016-09-05 DIAGNOSIS — R2689 Other abnormalities of gait and mobility: Secondary | ICD-10-CM | POA: Diagnosis not present

## 2016-09-05 DIAGNOSIS — R2681 Unsteadiness on feet: Secondary | ICD-10-CM | POA: Diagnosis not present

## 2016-09-06 DIAGNOSIS — R2689 Other abnormalities of gait and mobility: Secondary | ICD-10-CM | POA: Diagnosis not present

## 2016-09-06 DIAGNOSIS — R2681 Unsteadiness on feet: Secondary | ICD-10-CM | POA: Diagnosis not present

## 2016-09-06 DIAGNOSIS — M160 Bilateral primary osteoarthritis of hip: Secondary | ICD-10-CM | POA: Diagnosis not present

## 2016-09-06 DIAGNOSIS — M6281 Muscle weakness (generalized): Secondary | ICD-10-CM | POA: Diagnosis not present

## 2016-09-09 DIAGNOSIS — M6281 Muscle weakness (generalized): Secondary | ICD-10-CM | POA: Diagnosis not present

## 2016-09-09 DIAGNOSIS — M160 Bilateral primary osteoarthritis of hip: Secondary | ICD-10-CM | POA: Diagnosis not present

## 2016-09-09 DIAGNOSIS — R2681 Unsteadiness on feet: Secondary | ICD-10-CM | POA: Diagnosis not present

## 2016-09-09 DIAGNOSIS — R2689 Other abnormalities of gait and mobility: Secondary | ICD-10-CM | POA: Diagnosis not present

## 2016-09-10 DIAGNOSIS — R2689 Other abnormalities of gait and mobility: Secondary | ICD-10-CM | POA: Diagnosis not present

## 2016-09-10 DIAGNOSIS — R2681 Unsteadiness on feet: Secondary | ICD-10-CM | POA: Diagnosis not present

## 2016-09-10 DIAGNOSIS — M6281 Muscle weakness (generalized): Secondary | ICD-10-CM | POA: Diagnosis not present

## 2016-09-10 DIAGNOSIS — M160 Bilateral primary osteoarthritis of hip: Secondary | ICD-10-CM | POA: Diagnosis not present

## 2016-09-11 DIAGNOSIS — M160 Bilateral primary osteoarthritis of hip: Secondary | ICD-10-CM | POA: Diagnosis not present

## 2016-09-11 DIAGNOSIS — R2681 Unsteadiness on feet: Secondary | ICD-10-CM | POA: Diagnosis not present

## 2016-09-11 DIAGNOSIS — M6281 Muscle weakness (generalized): Secondary | ICD-10-CM | POA: Diagnosis not present

## 2016-09-11 DIAGNOSIS — R2689 Other abnormalities of gait and mobility: Secondary | ICD-10-CM | POA: Diagnosis not present

## 2016-09-13 DIAGNOSIS — R2689 Other abnormalities of gait and mobility: Secondary | ICD-10-CM | POA: Diagnosis not present

## 2016-09-13 DIAGNOSIS — M6281 Muscle weakness (generalized): Secondary | ICD-10-CM | POA: Diagnosis not present

## 2016-09-13 DIAGNOSIS — M160 Bilateral primary osteoarthritis of hip: Secondary | ICD-10-CM | POA: Diagnosis not present

## 2016-09-13 DIAGNOSIS — R2681 Unsteadiness on feet: Secondary | ICD-10-CM | POA: Diagnosis not present

## 2016-09-26 DIAGNOSIS — M6281 Muscle weakness (generalized): Secondary | ICD-10-CM | POA: Diagnosis not present

## 2016-09-26 DIAGNOSIS — R2689 Other abnormalities of gait and mobility: Secondary | ICD-10-CM | POA: Diagnosis not present

## 2016-09-26 DIAGNOSIS — M160 Bilateral primary osteoarthritis of hip: Secondary | ICD-10-CM | POA: Diagnosis not present

## 2016-09-26 DIAGNOSIS — R2681 Unsteadiness on feet: Secondary | ICD-10-CM | POA: Diagnosis not present

## 2016-09-30 DIAGNOSIS — R2681 Unsteadiness on feet: Secondary | ICD-10-CM | POA: Diagnosis not present

## 2016-09-30 DIAGNOSIS — M6281 Muscle weakness (generalized): Secondary | ICD-10-CM | POA: Diagnosis not present

## 2016-09-30 DIAGNOSIS — R2689 Other abnormalities of gait and mobility: Secondary | ICD-10-CM | POA: Diagnosis not present

## 2016-09-30 DIAGNOSIS — M160 Bilateral primary osteoarthritis of hip: Secondary | ICD-10-CM | POA: Diagnosis not present

## 2016-10-08 ENCOUNTER — Ambulatory Visit (INDEPENDENT_AMBULATORY_CARE_PROVIDER_SITE_OTHER): Payer: Medicare Other | Admitting: Adult Health

## 2016-10-08 VITALS — BP 142/82 | HR 86 | Temp 98.6°F | Wt 151.7 lb

## 2016-10-08 DIAGNOSIS — G8929 Other chronic pain: Secondary | ICD-10-CM

## 2016-10-08 DIAGNOSIS — M545 Low back pain, unspecified: Secondary | ICD-10-CM

## 2016-10-08 DIAGNOSIS — R350 Frequency of micturition: Secondary | ICD-10-CM | POA: Diagnosis not present

## 2016-10-08 LAB — POC URINALSYSI DIPSTICK (AUTOMATED)
Blood, UA: 2
PH UA: 6 (ref 5.0–8.0)
PROTEIN UA: 1
Spec Grav, UA: 1.015 (ref 1.010–1.025)
Urobilinogen, UA: 0.2 E.U./dL

## 2016-10-08 NOTE — Progress Notes (Signed)
Subjective:    Patient ID: [name] [name], male    DOB: [birth year], 83 y.o.   MRN: [id number]  HPI  83 year old male who  has a past medical history of Cancer [facility]); CHF (congestive heart failure) ([place]); Collagen vascular disease ([place]); and Hypertension. He is a patient of Dr. Alain Marion who I am seeing today for the first time for the chronic complaint of low back pain. He feels as though his back pain is getting worse. Pain is worse when sitting .   He is currently taking Tylenol and topical pain ointment ( BioFreeze) for pain control.   Additionally, he complains of urinary frequency. This is a chronic issue is well. He was started on Flomax within the last 6 months? Denies any burning or incomplete bladder emptying.   Review of Systems  Constitutional: Negative.   Respiratory: Negative.   Cardiovascular: Negative.   Genitourinary: Positive for frequency.  Musculoskeletal: Positive for arthralgias, back pain and gait problem.  All other systems reviewed and are negative.  Past Medical History:  Diagnosis Date  . Cancer ([place])    Stomach  . CHF (congestive heart failure) ([place])   . Collagen vascular disease ([place])   . Hypertension     Social History   Social History  . Marital status: Widowed    Spouse name: N/A  . Number of children: 2  . Years of education: N/A   Occupational History  . retired Retired   Social History Main Topics  . Smoking status: Former Smoker    Packs/day: 1.00    Years: 30.00    Quit date: 10/26/1976  . Smokeless tobacco: Never Used  . Alcohol use No  . Drug use: No  . Sexual activity: Not Currently   Other Topics Concern  . Not on file   Social History Narrative   Regular exercise--no.    Past Surgical History:  Procedure Laterality Date  . APPENDECTOMY    . COLONOSCOPY  12/1998, 02/2004   diverticulosois, external hemorrhoids  . CRANIOTOMY Left   . EP IMPLANTABLE DEVICE N/A 06/12/2015   Procedure: Pacemaker Implant ;  Surgeon:  Evans Lance, MD;  Location: [place] [facility];  Service: Cardiovascular;  Laterality: N/A;  . ESOPHAGOGASTRODUODENOSCOPY  06/17/2014   Dr. Oneida Alar: 1. Patent stricture at the gastroesophageal junction 2. UGIB Due to multiple  gastric ulcers 3. Moderate Duodentitis. Negative H.pylori  . ESOPHAGOGASTRODUODENOSCOPY N/A 10/17/2014   Procedure: ESOPHAGOGASTRODUODENOSCOPY (EGD);  Surgeon: Danie Binder, MD;  Location: [facility];  Service: Endoscopy;  Laterality: N/A;  1030  . ESOPHAGOGASTRODUODENOSCOPY N/A 02/10/2015   Procedure: ESOPHAGOGASTRODUODENOSCOPY (EGD);  Surgeon: Danie Binder, MD;  Location: [facility];  Service: Endoscopy;  Laterality: N/A;  115  . heart disease     permanent pacemaker  . HIP FRACTURE SURGERY  2011   ORIF  R.  . INGUINAL HERNIA REPAIR    . IR GENERIC HISTORICAL  10/26/2015   IR RADIOLOGIST EVAL & MGMT 10/26/2015 [facility]INTER[facility] RAD  . PACEMAKER INSERTION      Family History  Problem Relation Age of Onset  . Cancer Sister   . Coronary artery disease Unknown        male 1st degree relative<60  . Hypertension Unknown     Allergies  Allergen Reactions  . Aleve [Naproxen Sodium] Other (See Comments)    Bleeding ulcers  . Ibuprofen Other (See Comments)    Bleeding ulcers  . Tramadol     Hallucinations  Current Outpatient Prescriptions on File Prior to Visit  Medication Sig Dispense Refill  . acetaminophen (TYLENOL) 500 MG tablet Take 500 mg by mouth every 6 (six) hours as needed for headache.    . albuterol (PROVENTIL) (2.5 MG/3ML) 0.083% nebulizer solution USE ONE VIAL VIA NEBULIZER FOUR TIMES DAILY AS NEEDED. 375 mL 11  . AMBULATORY NON FORMULARY MEDICATION Diltiazem gel 2% Apply small amount pea-sized to anal area 2-3 times a day for anal stenosis. Use as needed. 30 g 5  . Artificial Tear Ointment (DRY EYES OP) Apply 1 drop to eye daily.    Marland Kitchen atorvastatin (LIPITOR) 20 MG tablet Take 0.5 tablets (10 mg total) by mouth 2 (two) times daily. 90 tablet  3  . diltiazem 2 % GEL Apply 1 application topically 3 (three) times daily. 30 g 11  . diphenoxylate-atropine (LOMOTIL) 2.5-0.025 MG tablet Take 1 tablet by mouth 4 (four) times daily as needed for diarrhea or loose stools. (Patient not taking: Reported on 06/17/2016) 60 tablet 1  . donepezil (ARICEPT) 10 MG tablet Take 1 tablet (10 mg total) by mouth at bedtime. 90 tablet 3  . fluticasone (FLONASE) 50 MCG/ACT nasal spray Place 2 sprays into both nostrils daily. 48 g 3  . folic acid (FOLVITE) 884 MCG tablet Take 400 mcg by mouth daily.     Marland Kitchen levothyroxine (SYNTHROID, LEVOTHROID) 112 MCG tablet Take 1 tablet (112 mcg total) by mouth daily. 30 tablet 11  . multivitamin-iron-minerals-folic acid (CENTRUM) chewable tablet Chew 1 tablet by mouth daily.    . nitroGLYCERIN (NITROSTAT) 0.4 MG SL tablet Place 1 tablet (0.4 mg total) under the tongue every 5 (five) minutes as needed. Chest pain (Patient not taking: Reported on 06/17/2016) 20 tablet 1  . pantoprazole (PROTONIX) 40 MG tablet Take 1 tablet (40 mg total) by mouth daily. 90 tablet 0  . triamcinolone (NASACORT AQ) 55 MCG/ACT AERO nasal inhaler Place 2 sprays into the nose daily. 3 Inhaler 3  . triamcinolone ointment (KENALOG) 0.1 % Apply 1 application topically 2 (two) times daily. 80 g 2   No current facility-administered medications on file prior to visit.     BP (!) 142/82 (BP Location: Left Arm, Patient Position: Sitting, Cuff Size: Normal)   Pulse 86   Temp 98.6 F (37 C) (Oral)   Wt 151 lb 11.2 oz (68.8 kg)   SpO2 91%   BMI 26.04 kg/m       Objective:   Physical Exam  Constitutional: He is oriented to person, place, and time. He appears well-developed and well-nourished. No distress.  Cardiovascular: Normal rate, regular rhythm, normal heart sounds and intact distal pulses.  Exam reveals no gallop and no friction rub.   No murmur heard. Pulmonary/Chest: Effort normal and breath sounds normal. No respiratory distress. He has no  wheezes. He has no rales. He exhibits no tenderness.  Musculoskeletal: Normal range of motion. He exhibits no edema, tenderness or deformity.  Walks with rolling walker   Neurological: He is alert and oriented to person, place, and time. He has normal reflexes. He displays normal reflexes. No cranial nerve deficit. He exhibits normal muscle tone. Coordination normal.  Skin: Skin is warm and dry. No rash noted. He is not diaphoretic. No erythema. No pallor.  Psychiatric: He has a normal mood and affect. His behavior is normal. Judgment and thought content normal.  Nursing note and vitals reviewed.     Assessment & Plan:  1. Urinary frequency - POCT Urinalysis Dipstick (Automated) +  leuks and Blood - Will send urine culture and treat as directed  - Urine Culture  2. Chronic bilateral low back pain without sciatica - Possible UTI may be contributing to low back pain.  - Follow up with Dr. Alain Marion as needed - Can continue with Tylenol   Dorothyann Peng, NP

## 2016-10-10 LAB — URINE CULTURE

## 2016-10-22 ENCOUNTER — Ambulatory Visit: Payer: Medicare Other | Admitting: Internal Medicine

## 2016-10-25 ENCOUNTER — Other Ambulatory Visit (INDEPENDENT_AMBULATORY_CARE_PROVIDER_SITE_OTHER): Payer: Medicare Other

## 2016-10-25 ENCOUNTER — Encounter: Payer: Self-pay | Admitting: Internal Medicine

## 2016-10-25 ENCOUNTER — Ambulatory Visit (INDEPENDENT_AMBULATORY_CARE_PROVIDER_SITE_OTHER): Payer: Medicare Other | Admitting: Internal Medicine

## 2016-10-25 DIAGNOSIS — G8929 Other chronic pain: Secondary | ICD-10-CM

## 2016-10-25 DIAGNOSIS — I10 Essential (primary) hypertension: Secondary | ICD-10-CM

## 2016-10-25 DIAGNOSIS — N183 Chronic kidney disease, stage 3 unspecified: Secondary | ICD-10-CM

## 2016-10-25 DIAGNOSIS — M545 Low back pain: Secondary | ICD-10-CM | POA: Diagnosis not present

## 2016-10-25 DIAGNOSIS — K7460 Unspecified cirrhosis of liver: Secondary | ICD-10-CM | POA: Diagnosis not present

## 2016-10-25 DIAGNOSIS — I5042 Chronic combined systolic (congestive) and diastolic (congestive) heart failure: Secondary | ICD-10-CM

## 2016-10-25 LAB — BASIC METABOLIC PANEL
BUN: 41 mg/dL — AB (ref 6–23)
CALCIUM: 8.8 mg/dL (ref 8.4–10.5)
CHLORIDE: 99 meq/L (ref 96–112)
CO2: 23 mEq/L (ref 19–32)
CREATININE: 1.9 mg/dL — AB (ref 0.40–1.50)
GFR: 35.4 mL/min — AB (ref >60.00)
Glucose, Bld: 137 mg/dL — ABNORMAL HIGH (ref 70–99)
Potassium: 4.3 mEq/L (ref 3.5–5.1)
Sodium: 130 mEq/L — ABNORMAL LOW (ref 135–145)

## 2016-10-25 LAB — CBC WITH DIFFERENTIAL/PLATELET
Basophils Absolute: 0.1 10*3/uL (ref 0.0–0.1)
Basophils Relative: 0.9 % (ref 0.0–3.0)
EOS ABS: 0.3 10*3/uL (ref 0.0–0.7)
EOS PCT: 3.1 % (ref 0.0–5.0)
HCT: 36.9 % — ABNORMAL LOW (ref 39.0–52.0)
Hemoglobin: 12 g/dL — ABNORMAL LOW (ref 13.0–17.0)
LYMPHS ABS: 1.6 10*3/uL (ref 0.7–4.0)
Lymphocytes Relative: 13.9 % (ref 12.0–46.0)
MCHC: 32.5 g/dL (ref 30.0–36.0)
MCV: 95.1 fl (ref 78.0–100.0)
Monocytes Absolute: 1.9 10*3/uL — ABNORMAL HIGH (ref 0.1–1.0)
Monocytes Relative: 16.9 % — ABNORMAL HIGH (ref 3.0–12.0)
Neutro Abs: 7.3 10*3/uL (ref 1.4–7.7)
Neutrophils Relative %: 65.2 % (ref 43.0–77.0)
Platelets: 224 10*3/uL (ref 150.0–400.0)
RBC: 3.87 Mil/uL — AB (ref 4.22–5.81)
RDW: 14.5 % (ref 11.5–15.5)
WBC: 11.3 10*3/uL — ABNORMAL HIGH (ref 4.0–10.5)

## 2016-10-25 LAB — HEPATIC FUNCTION PANEL
ALK PHOS: 451 U/L — AB (ref 39–117)
ALT: 20 U/L (ref 0–53)
AST: 24 U/L (ref 0–37)
Albumin: 3.6 g/dL (ref 3.5–5.2)
BILIRUBIN DIRECT: 0.2 mg/dL (ref 0.0–0.3)
TOTAL PROTEIN: 7.1 g/dL (ref 6.0–8.3)
Total Bilirubin: 0.6 mg/dL (ref 0.2–1.2)

## 2016-10-25 LAB — TSH: TSH: 3.32 u[IU]/mL (ref 0.35–4.50)

## 2016-10-25 MED ORDER — ZOSTER VAC RECOMB ADJUVANTED 50 MCG/0.5ML IM SUSR: 0.5000 mL | Freq: Once | INTRAMUSCULAR | 1 refills | Status: AC

## 2016-10-25 NOTE — Assessment & Plan Note (Signed)
Metoprolol 

## 2016-10-25 NOTE — Patient Instructions (Signed)
Try Turmeric 

## 2016-10-25 NOTE — Assessment & Plan Note (Signed)
Labs Toprol

## 2016-10-25 NOTE — Assessment & Plan Note (Signed)
Labs

## 2016-10-25 NOTE — Assessment & Plan Note (Signed)
Turmeric

## 2016-10-25 NOTE — Progress Notes (Signed)
Subjective:  Patient ID: [name] [name], male    DOB: 1922-04-13  Age: 83 y.o. MRN: [id number]  CC: No chief complaint on file.   HPI [name] [name] presents for LBP, OA, GERD f/u  Outpatient Medications Prior to Visit  Medication Sig Dispense Refill  . acetaminophen (TYLENOL) 500 MG tablet Take 500 mg by mouth every 6 (six) hours as needed for headache.    . albuterol (PROVENTIL) (2.5 MG/3ML) 0.083% nebulizer solution USE ONE VIAL VIA NEBULIZER FOUR TIMES DAILY AS NEEDED. 375 mL 11  . AMBULATORY NON FORMULARY MEDICATION Diltiazem gel 2% Apply small amount pea-sized to anal area 2-3 times a day for anal stenosis. Use as needed. 30 g 5  . Artificial Tear Ointment (DRY EYES OP) Apply 1 drop to eye daily.    Marland Kitchen atorvastatin (LIPITOR) 20 MG tablet Take 0.5 tablets (10 mg total) by mouth 2 (two) times daily. 90 tablet 3  . diltiazem 2 % GEL Apply 1 application topically 3 (three) times daily. 30 g 11  . diphenoxylate-atropine (LOMOTIL) 2.5-0.025 MG tablet Take 1 tablet by mouth 4 (four) times daily as needed for diarrhea or loose stools. 60 tablet 1  . donepezil (ARICEPT) 10 MG tablet Take 1 tablet (10 mg total) by mouth at bedtime. 90 tablet 3  . fluticasone (FLONASE) 50 MCG/ACT nasal spray Place 2 sprays into both nostrils daily. 48 g 3  . folic acid (FOLVITE) 481 MCG tablet Take 400 mcg by mouth daily.     Marland Kitchen levothyroxine (SYNTHROID, LEVOTHROID) 112 MCG tablet Take 1 tablet (112 mcg total) by mouth daily. 30 tablet 11  . multivitamin-iron-minerals-folic acid (CENTRUM) chewable tablet Chew 1 tablet by mouth daily.    . nitroGLYCERIN (NITROSTAT) 0.4 MG SL tablet Place 1 tablet (0.4 mg total) under the tongue every 5 (five) minutes as needed. Chest pain 20 tablet 1  . pantoprazole (PROTONIX) 40 MG tablet Take 1 tablet (40 mg total) by mouth daily. 90 tablet 0  . triamcinolone (NASACORT AQ) 55 MCG/ACT AERO nasal inhaler Place 2 sprays into the nose daily. 3 Inhaler 3  . triamcinolone  ointment (KENALOG) 0.1 % Apply 1 application topically 2 (two) times daily. 80 g 2   No facility-administered medications prior to visit.     ROS Review of Systems  Constitutional: Negative for appetite change, fatigue and unexpected weight change.  HENT: Negative for congestion, nosebleeds, sneezing, sore throat and trouble swallowing.   Eyes: Negative for itching and visual disturbance.  Respiratory: Negative for cough.   Cardiovascular: Negative for chest pain, palpitations and leg swelling.  Gastrointestinal: Negative for abdominal distention, blood in stool, diarrhea and nausea.  Genitourinary: Negative for frequency and hematuria.  Musculoskeletal: Positive for arthralgias, back pain and gait problem. Negative for joint swelling and neck pain.  Skin: Negative for rash.  Neurological: Negative for dizziness, tremors, speech difficulty and weakness.  Psychiatric/Behavioral: Positive for decreased concentration. Negative for agitation, dysphoric mood and sleep disturbance. The patient is nervous/anxious.     Objective:  BP (!) 112/52 (BP Location: Left Arm, Patient Position: Sitting, Cuff Size: Normal)   Pulse 71   Ht 5\' 4"  (1.626 m)   Wt 152 lb (68.9 kg)   SpO2 96%   BMI 26.09 kg/m   BP Readings from Last 3 Encounters:  10/25/16 (!) 112/52  10/08/16 (!) 142/82  07/02/16 122/72    Wt Readings from Last 3 Encounters:  10/25/16 152 lb (68.9 kg)  10/08/16 151 lb 11.2 oz (68.8  kg)  07/02/16 155 lb 1.6 oz (70.4 kg)    Physical Exam  Constitutional: He is oriented to person, place, and time. He appears well-developed. No distress.  NAD  HENT:  Mouth/Throat: Oropharynx is clear and moist.  Eyes: Pupils are equal, round, and reactive to light. Conjunctivae are normal.  Neck: Normal range of motion. No JVD present. No thyromegaly present.  Cardiovascular: Normal rate, regular rhythm, normal heart sounds and intact distal pulses.  Exam reveals no gallop and no friction rub.     No murmur heard. Pulmonary/Chest: Effort normal and breath sounds normal. No respiratory distress. He has no wheezes. He has no rales. He exhibits no tenderness.  Abdominal: Soft. Bowel sounds are normal. He exhibits no distension and no mass. There is no tenderness. There is no rebound and no guarding.  Musculoskeletal: Normal range of motion. He exhibits tenderness. He exhibits no edema.  Lymphadenopathy:    He has no cervical adenopathy.  Neurological: He is alert and oriented to person, place, and time. He has normal reflexes. No cranial nerve deficit. He exhibits normal muscle tone. He displays a negative Romberg sign. Coordination and gait normal.  Skin: Skin is warm and dry. No rash noted.  Psychiatric: He has a normal mood and affect. His behavior is normal. Judgment and thought content normal.    Lab Results  Component Value Date   WBC 9.6 06/17/2016   HGB 11.9 (L) 06/17/2016   HCT 35.1 (L) 06/17/2016   PLT 183 06/17/2016   GLUCOSE 103 06/17/2016   CHOL 110 05/25/2013   TRIG 67.0 05/25/2013   HDL 40.30 05/25/2013   LDLCALC 56 05/25/2013   ALT 44 06/17/2016   AST 39 (H) 06/17/2016   NA 133 (L) 06/17/2016   K 4.4 06/17/2016   CL 101 06/12/2016   CREATININE 1.9 (H) 06/17/2016   BUN 37.9 (H) 06/17/2016   CO2 20 (L) 06/17/2016   TSH 3.86 06/12/2016   PSA 3.42 11/28/2011   INR 1.15 10/12/2015    Ct Abdomen Wo Contrast  Result Date: 07/01/2016 CLINICAL DATA:  Elevated alkaline phosphatase, anemia, cirrhosis. Lymphoma. EXAM: CT ABDOMEN WITHOUT CONTRAST TECHNIQUE: Multidetector CT imaging of the abdomen was performed following the standard protocol without IV contrast. COMPARISON:  PET 02/07/2015 and CT abdomen pelvis 10/31/2014. FINDINGS: Lower chest: Mild nonspecific coarsening in the lung bases. Calcified granuloma in the right lower lobe. Calcified pleural plaque in the lower right hemithorax. Heart size normal. Coronary artery calcification. No pericardial or pleural  effusion. Hepatobiliary: Liver margin is markedly irregular. Liver is otherwise grossly unremarkable. Stones are seen in the gallbladder. No biliary ductal dilatation. Pancreas: Negative. Spleen: Negative. Adrenals/Urinary Tract: Adrenal glands are unremarkable. Multiple low-attenuation lesions are seen throughout the kidneys, measuring up to 9.8 cm off the lower pole right kidney, as before. Some have associated thin peripheral or septal calcification, indicative of mild complexity. Additional characterization is limited without post-contrast imaging. Stomach/Bowel: Stomach is unremarkable. Proximal duodenal diverticulum is incidentally noted. Visualized portions of the small bowel and colon are otherwise unremarkable. Vascular/Lymphatic: Atherosclerotic calcification of the aorta without aneurysm. Porta hepatis and retroperitoneal lymph nodes are not enlarged by CT size criteria. Other: No free fluid.  Mesenteries and peritoneum are unremarkable. Musculoskeletal: No worrisome lytic or sclerotic lesions. Degenerative changes are seen in the spine date lower thoracic vertebroplasty. IMPRESSION: 1. No acute findings. 2. Cirrhosis. 3. Cholelithiasis. 4. Polycystic kidneys. Further characterization is limited without post-contrast imaging. 5. Aortic atherosclerosis (ICD10-170.0). Coronary artery calcification. Electronically Signed  By: Lorin Picket M.D.   On: 07/01/2016 15:54    Assessment & Plan:   There are no diagnoses linked to this encounter. I am having Mr. Godeaux maintain his folic acid, fluticasone, acetaminophen, Artificial Tear Ointment (DRY EYES OP), AMBULATORY NON FORMULARY MEDICATION, diphenoxylate-atropine, albuterol, levothyroxine, multivitamin-iron-minerals-folic acid, atorvastatin, donepezil, triamcinolone, nitroGLYCERIN, triamcinolone ointment, diltiazem, and pantoprazole.  No orders of the defined types were placed in this encounter.    Follow-up: No Follow-up on file.  Walker Kehr, MD

## 2016-10-25 NOTE — Assessment & Plan Note (Signed)
LFT

## 2016-11-01 DIAGNOSIS — M5116 Intervertebral disc disorders with radiculopathy, lumbar region: Secondary | ICD-10-CM | POA: Diagnosis not present

## 2016-11-01 DIAGNOSIS — M5136 Other intervertebral disc degeneration, lumbar region: Secondary | ICD-10-CM | POA: Diagnosis not present

## 2016-11-01 DIAGNOSIS — M5416 Radiculopathy, lumbar region: Secondary | ICD-10-CM | POA: Diagnosis not present

## 2016-11-05 ENCOUNTER — Telehealth: Payer: Self-pay | Admitting: Internal Medicine

## 2016-11-05 MED ORDER — PANTOPRAZOLE SODIUM 40 MG PO TBEC
40.0000 mg | DELAYED_RELEASE_TABLET | Freq: Every day | ORAL | 1 refills | Status: AC
Start: 2016-11-05 — End: ?

## 2016-11-05 NOTE — Telephone Encounter (Signed)
pantoprazole (PROTONIX) 40 MG tablet   Patient is requesting a refill on this medication. Please advise.   [facility], [address]. [place] [phone] (Phone) [phone] (Fax)

## 2016-11-05 NOTE — Telephone Encounter (Signed)
Reviewed chart pt is up-to-date sent refills to [facility]../lmb  

## 2016-11-14 DIAGNOSIS — M5136 Other intervertebral disc degeneration, lumbar region: Secondary | ICD-10-CM | POA: Diagnosis not present

## 2016-11-14 DIAGNOSIS — M47816 Spondylosis without myelopathy or radiculopathy, lumbar region: Secondary | ICD-10-CM | POA: Diagnosis not present

## 2016-11-21 ENCOUNTER — Other Ambulatory Visit: Payer: Self-pay | Admitting: Internal Medicine

## 2016-11-27 ENCOUNTER — Ambulatory Visit (INDEPENDENT_AMBULATORY_CARE_PROVIDER_SITE_OTHER): Payer: Medicare Other | Admitting: *Deleted

## 2016-11-27 ENCOUNTER — Telehealth: Payer: Self-pay | Admitting: Cardiology

## 2016-11-27 DIAGNOSIS — I495 Sick sinus syndrome: Secondary | ICD-10-CM | POA: Diagnosis not present

## 2016-11-27 NOTE — Telephone Encounter (Signed)
LMOVM reminding pt to send remote transmission.   

## 2016-11-28 NOTE — Progress Notes (Signed)
Remote pacemaker transmission.   

## 2016-12-03 ENCOUNTER — Encounter: Payer: Self-pay | Admitting: Cardiology

## 2016-12-06 DIAGNOSIS — M47816 Spondylosis without myelopathy or radiculopathy, lumbar region: Secondary | ICD-10-CM | POA: Diagnosis not present

## 2016-12-06 DIAGNOSIS — M5136 Other intervertebral disc degeneration, lumbar region: Secondary | ICD-10-CM | POA: Diagnosis not present

## 2016-12-06 LAB — CUP PACEART REMOTE DEVICE CHECK
Battery Remaining Longevity: 142 mo
Brady Statistic AS VS Percent: 10 %
Implantable Lead Implant Date: 20031204
Implantable Lead Location: 753860
Implantable Lead Model: 6947
Implantable Pulse Generator Implant Date: 20170320
Lead Channel Pacing Threshold Amplitude: 0.625 V
Lead Channel Pacing Threshold Amplitude: 0.875 V
Lead Channel Pacing Threshold Pulse Width: 0.4 ms
Lead Channel Setting Pacing Amplitude: 2 V
Lead Channel Setting Pacing Pulse Width: 0.4 ms
MDC IDC LEAD IMPLANT DT: 20031204
MDC IDC LEAD LOCATION: 753859
MDC IDC MSMT BATTERY IMPEDANCE: 108 Ohm
MDC IDC MSMT BATTERY VOLTAGE: 2.78 V
MDC IDC MSMT LEADCHNL RA IMPEDANCE VALUE: 513 Ohm
MDC IDC MSMT LEADCHNL RV IMPEDANCE VALUE: 461 Ohm
MDC IDC MSMT LEADCHNL RV PACING THRESHOLD PULSEWIDTH: 0.4 ms
MDC IDC MSMT LEADCHNL RV SENSING INTR AMPL: 16 mV
MDC IDC SESS DTM: 20180905174144
MDC IDC SET LEADCHNL RV PACING AMPLITUDE: 2.5 V
MDC IDC SET LEADCHNL RV SENSING SENSITIVITY: 5.6 mV
MDC IDC STAT BRADY AP VP PERCENT: 3 %
MDC IDC STAT BRADY AP VS PERCENT: 87 %
MDC IDC STAT BRADY AS VP PERCENT: 0 %

## 2016-12-11 ENCOUNTER — Other Ambulatory Visit: Payer: Self-pay | Admitting: Internal Medicine

## 2017-01-02 ENCOUNTER — Other Ambulatory Visit: Payer: Self-pay | Admitting: Internal Medicine

## 2017-01-09 ENCOUNTER — Ambulatory Visit (INDEPENDENT_AMBULATORY_CARE_PROVIDER_SITE_OTHER)
Admission: RE | Admit: 2017-01-09 | Discharge: 2017-01-09 | Disposition: A | Discharge status: Home / Self Care | Payer: Medicare Other | Source: Ambulatory Visit | Attending: Internal Medicine | Admitting: Internal Medicine

## 2017-01-09 ENCOUNTER — Telehealth: Payer: Self-pay | Admitting: Internal Medicine

## 2017-01-09 ENCOUNTER — Ambulatory Visit (INDEPENDENT_AMBULATORY_CARE_PROVIDER_SITE_OTHER): Payer: Medicare Other | Admitting: Internal Medicine

## 2017-01-09 ENCOUNTER — Encounter: Payer: Self-pay | Admitting: Internal Medicine

## 2017-01-09 VITALS — BP 144/80 | HR 81 | Temp 97.7°F | Resp 16 | Wt 147.0 lb

## 2017-01-09 DIAGNOSIS — M542 Cervicalgia: Secondary | ICD-10-CM | POA: Diagnosis not present

## 2017-01-09 DIAGNOSIS — R51 Headache: Secondary | ICD-10-CM

## 2017-01-09 DIAGNOSIS — S199XXA Unspecified injury of neck, initial encounter: Secondary | ICD-10-CM | POA: Diagnosis not present

## 2017-01-09 DIAGNOSIS — S0990XA Unspecified injury of head, initial encounter: Secondary | ICD-10-CM | POA: Diagnosis not present

## 2017-01-09 DIAGNOSIS — R519 Headache, unspecified: Secondary | ICD-10-CM

## 2017-01-09 DIAGNOSIS — S12001A Unspecified nondisplaced fracture of first cervical vertebra, initial encounter for closed fracture: Secondary | ICD-10-CM | POA: Insufficient documentation

## 2017-01-09 DIAGNOSIS — S299XXA Unspecified injury of thorax, initial encounter: Secondary | ICD-10-CM | POA: Diagnosis not present

## 2017-01-09 NOTE — Telephone Encounter (Signed)
Please call - [phone] - [name] [name] today of his neck shows a fracture.  We need to do a ct of his neck in addition to his head.  ideally he should have this done today so we can determine if he needs any treatment immediately or not -- it looks pretty stable but we want to make sure .  They will call her to schedule

## 2017-01-09 NOTE — Progress Notes (Signed)
Subjective:    Patient ID: [name] [name], male    DOB: 08/06/1922, 83 y.o.   MRN: [id number]  HPI He is here for an acute visit.   He felt almost 2 weeks ago. He was sitting in his chair at night controlled of late to go to bed. When he got out of his chair he reach for his walker and it was not there and he fell forward. He hit his right forehead and does have a healing laceration. He denied any other major injuries. Since then he has had some headaches throughout his head, neck pain and stiffness in his neck. He has decreased range of motion of his neck. He states he has had some blurry vision, which she thinks is new since the fall. He denies any dizziness, lightheadedness usually and nausea. He has been applying heat to his neck and taking Tylenol, which he typically does. Those measures seem to be helping. He is also doing exercises with his neck, which helps. He has been needing to sleep in a recliner, which is more comfortable sleeping in bed for his neck pain.  He does have a history of a subdermal hematoma that occurred years ago. He had a plastic plate in his head from that. He also has a history of compression fractures and has had 2 vertebroplasties in the past.    Medications and allergies reviewed with patient and updated if appropriate.  Patient Active Problem List   Diagnosis Date Noted  . Liver cirrhosis ([place]) 06/17/2016  . Chronic kidney disease, stage IV (severe) ([place]) 06/17/2016  . Dry skin dermatitis 06/12/2016  . Preventive measure 12/18/2015  . Elevated alkaline phosphatase level 12/18/2015  . Rash, skin 08/26/2015  . Diarrhea 05/19/2015  . UTI (urinary tract infection) 05/19/2015  . Monocytosis 05/12/2015  . Urinary tract infection, site not specified 04/06/2015  . Stomach pain 04/06/2015  . Cough 03/28/2015  . Pressure ulcer 03/13/2015  . Dyslipidemia 03/12/2015  . Leukocytosis 03/12/2015  . Chronic combined systolic and diastolic CHF (congestive heart  failure) ([place]) 03/12/2015  . CKD (chronic kidney disease) stage 3, GFR 30-59 ml/min ([facility]) 03/12/2015  . Acute respiratory failure with hypoxemia ([place]) 03/12/2015  . Right femoral fracture, closed, initial encounter 03/12/2015  . Fracture of proximal end of right femur ([place]) 03/11/2015  . History of B-cell lymphoma 10/27/2014  . Thrombocytopenia ([place]) 10/26/2014  . Anemia in chronic renal disease 06/18/2014  . Automatic implantable cardioverter-defibrillator in situ 12/30/2008  . Memory loss 06/21/2008  . LOW BACK PAIN 06/15/2007  . Hypothyroidism 01/08/2007  . Essential hypertension 01/08/2007    Current Outpatient Prescriptions on File Prior to Visit  Medication Sig Dispense Refill  . acetaminophen (TYLENOL) 500 MG tablet Take 500 mg by mouth every 6 (six) hours as needed for headache.    . albuterol (PROVENTIL) (2.5 MG/3ML) 0.083% nebulizer solution USE ONE VIAL VIA NEBULIZER FOUR TIMES DAILY AS NEEDED. 375 mL 11  . AMBULATORY NON FORMULARY MEDICATION Diltiazem gel 2% Apply small amount pea-sized to anal area 2-3 times a day for anal stenosis. Use as needed. 30 g 5  . Artificial Tear Ointment (DRY EYES OP) Apply 1 drop to eye daily.    Marland Kitchen atorvastatin (LIPITOR) 20 MG tablet TAKE 1/2 TABLET BY MOUTH TWICE DAILY 90 tablet 1  . diltiazem 2 % GEL Apply 1 application topically 3 (three) times daily. 30 g 11  . diphenoxylate-atropine (LOMOTIL) 2.5-0.025 MG tablet Take 1 tablet by mouth 4 (four) times  daily as needed for diarrhea or loose stools. 60 tablet 1  . fluticasone (FLONASE) 50 MCG/ACT nasal spray Place 2 sprays into both nostrils daily. 48 g 3  . folic acid (FOLVITE) 539 MCG tablet Take 400 mcg by mouth daily.     Marland Kitchen levothyroxine (SYNTHROID, LEVOTHROID) 112 MCG tablet TAKE 1 TABLET BY MOUTH DAILY 90 tablet 0  . multivitamin-iron-minerals-folic acid (CENTRUM) chewable tablet Chew 1 tablet by mouth daily.    . nitroGLYCERIN (NITROSTAT) 0.4 MG SL tablet Place 1 tablet (0.4 mg total)  under the tongue every 5 (five) minutes as needed. Chest pain 20 tablet 1  . pantoprazole (PROTONIX) 40 MG tablet Take 1 tablet (40 mg total) by mouth daily. 90 tablet 1  . triamcinolone (NASACORT AQ) 55 MCG/ACT AERO nasal inhaler Place 2 sprays into the nose daily. 3 Inhaler 3  . triamcinolone ointment (KENALOG) 0.1 % Apply 1 application topically 2 (two) times daily. 80 g 2  . donepezil (ARICEPT) 10 MG tablet Take 1 tablet (10 mg total) by mouth at bedtime. 90 tablet 3   No current facility-administered medications on file prior to visit.     Past Medical History:  Diagnosis Date  . Cancer ([place])    Stomach  . CHF (congestive heart failure) ([place])   . Collagen vascular disease ([place])   . Hypertension     Past Surgical History:  Procedure Laterality Date  . APPENDECTOMY    . COLONOSCOPY  12/1998, 02/2004   diverticulosois, external hemorrhoids  . CRANIOTOMY Left   . EP IMPLANTABLE DEVICE N/A 06/12/2015   Procedure: Pacemaker Implant ;  Surgeon: Evans Lance, MD;  Location: [facility];  Service: Cardiovascular;  Laterality: N/A;  . ESOPHAGOGASTRODUODENOSCOPY  06/17/2014   Dr. Oneida Alar: 1. Patent stricture at the gastroesophageal junction 2. UGIB Due to multiple  gastric ulcers 3. Moderate Duodentitis. Negative H.pylori  . ESOPHAGOGASTRODUODENOSCOPY N/A 10/17/2014   Procedure: ESOPHAGOGASTRODUODENOSCOPY (EGD);  Surgeon: Danie Binder, MD;  Location: [facility];  Service: Endoscopy;  Laterality: N/A;  1030  . ESOPHAGOGASTRODUODENOSCOPY N/A 02/10/2015   Procedure: ESOPHAGOGASTRODUODENOSCOPY (EGD);  Surgeon: Danie Binder, MD;  Location: [facility];  Service: Endoscopy;  Laterality: N/A;  115  . heart disease     permanent pacemaker  . HIP FRACTURE SURGERY  2011   ORIF  R.  . INGUINAL HERNIA REPAIR    . IR GENERIC HISTORICAL  10/26/2015   IR RADIOLOGIST EVAL & MGMT 10/26/2015 [facility]  . PACEMAKER INSERTION      Social History   Social History  . Marital status:  Widowed    Spouse name: N/A  . Number of children: 2  . Years of education: N/A   Occupational History  . retired Retired   Social History Main Topics  . Smoking status: Former Smoker    Packs/day: 1.00    Years: 30.00    Quit date: 10/26/1976  . Smokeless tobacco: Never Used  . Alcohol use No  . Drug use: No  . Sexual activity: Not Currently   Other Topics Concern  . None   Social History Narrative   Regular exercise--no.    Family History  Problem Relation Age of Onset  . Cancer Sister   . Coronary artery disease Unknown        male 1st degree relative<60  . Hypertension Unknown     Review of Systems  Constitutional: Negative for appetite change and fever.  Eyes: Positive for visual disturbance.  Gastrointestinal: Negative for nausea.  Musculoskeletal: Positive for back pain, neck pain and neck stiffness.  Neurological: Positive for headaches. Negative for dizziness, weakness, light-headedness and numbness.  Psychiatric/Behavioral: Negative for confusion.       Objective:   Vitals:   01/09/17 1134  BP: (!) 144/80  Pulse: 81  Resp: 16  Temp: 97.7 F (36.5 C)  SpO2: 97%   Filed Weights   01/09/17 1134  Weight: 147 lb (66.7 kg)   Body mass index is 25.23 kg/m.  Wt Readings from Last 3 Encounters:  01/09/17 147 lb (66.7 kg)  10/25/16 152 lb (68.9 kg)  10/08/16 151 lb 11.2 oz (68.8 kg)     Physical Exam  Constitutional: He is oriented to person, place, and time. He appears well-developed and well-nourished. No distress.  HENT:  Head: Normocephalic.  Healing 1 x 1.5 cm laceration on right forehead w/o swelling, erythema or discharge  Eyes: Conjunctivae are normal.  Musculoskeletal:  Tenderness with palpation posterior neck and paraspinal muscles  Neurological: He is alert and oriented to person, place, and time. No cranial nerve deficit. Coordination abnormal.  Skin: Skin is warm and dry. He is not diaphoretic.  Psychiatric: He has a normal  mood and affect.          Assessment & Plan:   See Problem List for Assessment and Plan of chronic medical problems.

## 2017-01-09 NOTE — Assessment & Plan Note (Signed)
After fall 2 weeks ago X-ray today to rule out fracture

## 2017-01-09 NOTE — Patient Instructions (Signed)
Have xrays today.   A ct scan of your head was ordered.  Someone will call you to schedule that.

## 2017-01-09 NOTE — Assessment & Plan Note (Signed)
Fell at home 2 weeks ago Healing laceration right forehead Experiencing headaches-we will obtain CT scan of head to rule out bleed

## 2017-01-09 NOTE — Assessment & Plan Note (Signed)
Headaches since fall 2 weeks ago with possible increase in blurry vision No dizziness, lightheadedness, nausea or weakness Not on any blood thinners Stat CT of head to rule out bleed given persistent headache and history of subdural hematoma

## 2017-01-09 NOTE — Telephone Encounter (Signed)
Daughter has called back.  I have given her the number to CT - [phone].  She is going to call to see if she would be able to make it to CT today.  She would like a call back in regard to the location of the fracture at [phone].

## 2017-01-09 NOTE — Assessment & Plan Note (Signed)
Xray today shows fracture - will get Ct scan of neck today

## 2017-01-09 NOTE — Telephone Encounter (Signed)
LVM with pts daughter to call back and discuss. On [phone]  Spoke with Son-in-law to get above number

## 2017-01-10 ENCOUNTER — Other Ambulatory Visit: Payer: Self-pay | Admitting: Internal Medicine

## 2017-01-10 ENCOUNTER — Telehealth: Payer: Self-pay | Admitting: Internal Medicine

## 2017-01-10 DIAGNOSIS — S12100A Unspecified displaced fracture of second cervical vertebra, initial encounter for closed fracture: Secondary | ICD-10-CM

## 2017-01-10 DIAGNOSIS — S12001A Unspecified nondisplaced fracture of first cervical vertebra, initial encounter for closed fracture: Secondary | ICD-10-CM

## 2017-01-10 DIAGNOSIS — S12112A Nondisplaced Type II dens fracture, initial encounter for closed fracture: Secondary | ICD-10-CM | POA: Diagnosis not present

## 2017-01-10 NOTE — Telephone Encounter (Signed)
Spoke with [facility], they are going to call back to verify appt.   LVM with Butch Penny with information. Also spoke with Ivin Booty to give details and Note from MD. Will contact Ivin Booty back to confirm appt.

## 2017-01-10 NOTE — Telephone Encounter (Signed)
Please call neurosurgery  - [phone] to try to schedule a pt asap (today).  83 yo fell two weeks ago - ct of c-spine yesterday shows    Nondisplaced posterior C1 ring and type 2 dens fractures.  He has no radiculopathy, just neck pain.    If they can get him in today - can call daughter to schedule - [phone] Butch Penny or [phone] Ivin Booty.    If they can not see him today - would like advise on if he needs a collar, etc.     Please update daughter.  Let daughter know he should be taking 2000 units of vitamin d daily and 600 mg of calcium twice daily.

## 2017-01-10 NOTE — Telephone Encounter (Signed)
ordered

## 2017-01-10 NOTE — Telephone Encounter (Signed)
Please enter referral to [facility] STAT. Pt is being seen today by Dr Ronnald Ramp at 1230. I have faxed over imaging reports, demographics, and insurance card. [phone]

## 2017-01-10 NOTE — Telephone Encounter (Signed)
Per Dr Quay Burow, she has spoken to pts daughter in law who is the POA of pt. No need to call daughter at this time.

## 2017-01-21 DIAGNOSIS — R293 Abnormal posture: Secondary | ICD-10-CM | POA: Diagnosis not present

## 2017-01-21 DIAGNOSIS — R296 Repeated falls: Secondary | ICD-10-CM | POA: Diagnosis not present

## 2017-01-21 DIAGNOSIS — R2689 Other abnormalities of gait and mobility: Secondary | ICD-10-CM | POA: Diagnosis not present

## 2017-01-21 DIAGNOSIS — M6281 Muscle weakness (generalized): Secondary | ICD-10-CM | POA: Diagnosis not present

## 2017-01-22 DIAGNOSIS — R296 Repeated falls: Secondary | ICD-10-CM | POA: Diagnosis not present

## 2017-01-22 DIAGNOSIS — R2689 Other abnormalities of gait and mobility: Secondary | ICD-10-CM | POA: Diagnosis not present

## 2017-01-22 DIAGNOSIS — M6281 Muscle weakness (generalized): Secondary | ICD-10-CM | POA: Diagnosis not present

## 2017-01-22 DIAGNOSIS — R293 Abnormal posture: Secondary | ICD-10-CM | POA: Diagnosis not present

## 2017-01-23 DIAGNOSIS — R278 Other lack of coordination: Secondary | ICD-10-CM | POA: Diagnosis not present

## 2017-01-23 DIAGNOSIS — M6281 Muscle weakness (generalized): Secondary | ICD-10-CM | POA: Diagnosis not present

## 2017-01-23 DIAGNOSIS — M546 Pain in thoracic spine: Secondary | ICD-10-CM | POA: Diagnosis not present

## 2017-01-23 DIAGNOSIS — R296 Repeated falls: Secondary | ICD-10-CM | POA: Diagnosis not present

## 2017-01-23 DIAGNOSIS — R293 Abnormal posture: Secondary | ICD-10-CM | POA: Diagnosis not present

## 2017-01-23 DIAGNOSIS — R2689 Other abnormalities of gait and mobility: Secondary | ICD-10-CM | POA: Diagnosis not present

## 2017-01-23 DIAGNOSIS — J449 Chronic obstructive pulmonary disease, unspecified: Secondary | ICD-10-CM | POA: Diagnosis not present

## 2017-01-27 DIAGNOSIS — J449 Chronic obstructive pulmonary disease, unspecified: Secondary | ICD-10-CM | POA: Diagnosis not present

## 2017-01-27 DIAGNOSIS — R278 Other lack of coordination: Secondary | ICD-10-CM | POA: Diagnosis not present

## 2017-01-27 DIAGNOSIS — R293 Abnormal posture: Secondary | ICD-10-CM | POA: Diagnosis not present

## 2017-01-27 DIAGNOSIS — M546 Pain in thoracic spine: Secondary | ICD-10-CM | POA: Diagnosis not present

## 2017-01-27 DIAGNOSIS — M6281 Muscle weakness (generalized): Secondary | ICD-10-CM | POA: Diagnosis not present

## 2017-01-27 DIAGNOSIS — R2689 Other abnormalities of gait and mobility: Secondary | ICD-10-CM | POA: Diagnosis not present

## 2017-01-28 DIAGNOSIS — R293 Abnormal posture: Secondary | ICD-10-CM | POA: Diagnosis not present

## 2017-01-28 DIAGNOSIS — R278 Other lack of coordination: Secondary | ICD-10-CM | POA: Diagnosis not present

## 2017-01-28 DIAGNOSIS — M6281 Muscle weakness (generalized): Secondary | ICD-10-CM | POA: Diagnosis not present

## 2017-01-28 DIAGNOSIS — M546 Pain in thoracic spine: Secondary | ICD-10-CM | POA: Diagnosis not present

## 2017-01-28 DIAGNOSIS — R2689 Other abnormalities of gait and mobility: Secondary | ICD-10-CM | POA: Diagnosis not present

## 2017-01-28 DIAGNOSIS — J449 Chronic obstructive pulmonary disease, unspecified: Secondary | ICD-10-CM | POA: Diagnosis not present

## 2017-01-29 DIAGNOSIS — R2689 Other abnormalities of gait and mobility: Secondary | ICD-10-CM | POA: Diagnosis not present

## 2017-01-29 DIAGNOSIS — M546 Pain in thoracic spine: Secondary | ICD-10-CM | POA: Diagnosis not present

## 2017-01-29 DIAGNOSIS — R278 Other lack of coordination: Secondary | ICD-10-CM | POA: Diagnosis not present

## 2017-01-29 DIAGNOSIS — M6281 Muscle weakness (generalized): Secondary | ICD-10-CM | POA: Diagnosis not present

## 2017-01-29 DIAGNOSIS — J449 Chronic obstructive pulmonary disease, unspecified: Secondary | ICD-10-CM | POA: Diagnosis not present

## 2017-01-29 DIAGNOSIS — R293 Abnormal posture: Secondary | ICD-10-CM | POA: Diagnosis not present

## 2017-01-30 DIAGNOSIS — J449 Chronic obstructive pulmonary disease, unspecified: Secondary | ICD-10-CM | POA: Diagnosis not present

## 2017-01-30 DIAGNOSIS — R293 Abnormal posture: Secondary | ICD-10-CM | POA: Diagnosis not present

## 2017-01-30 DIAGNOSIS — R278 Other lack of coordination: Secondary | ICD-10-CM | POA: Diagnosis not present

## 2017-01-30 DIAGNOSIS — M6281 Muscle weakness (generalized): Secondary | ICD-10-CM | POA: Diagnosis not present

## 2017-01-30 DIAGNOSIS — R2689 Other abnormalities of gait and mobility: Secondary | ICD-10-CM | POA: Diagnosis not present

## 2017-01-30 DIAGNOSIS — M546 Pain in thoracic spine: Secondary | ICD-10-CM | POA: Diagnosis not present

## 2017-01-31 ENCOUNTER — Telehealth: Payer: Self-pay | Admitting: Internal Medicine

## 2017-01-31 ENCOUNTER — Other Ambulatory Visit: Payer: Self-pay | Admitting: Internal Medicine

## 2017-01-31 DIAGNOSIS — R293 Abnormal posture: Secondary | ICD-10-CM | POA: Diagnosis not present

## 2017-01-31 DIAGNOSIS — M6281 Muscle weakness (generalized): Secondary | ICD-10-CM | POA: Diagnosis not present

## 2017-01-31 DIAGNOSIS — M546 Pain in thoracic spine: Secondary | ICD-10-CM | POA: Diagnosis not present

## 2017-01-31 DIAGNOSIS — J9601 Acute respiratory failure with hypoxia: Secondary | ICD-10-CM

## 2017-01-31 DIAGNOSIS — J449 Chronic obstructive pulmonary disease, unspecified: Secondary | ICD-10-CM | POA: Diagnosis not present

## 2017-01-31 DIAGNOSIS — R278 Other lack of coordination: Secondary | ICD-10-CM | POA: Diagnosis not present

## 2017-01-31 DIAGNOSIS — R2689 Other abnormalities of gait and mobility: Secondary | ICD-10-CM | POA: Diagnosis not present

## 2017-01-31 NOTE — Telephone Encounter (Signed)
[employer] called [employer] stating that patients oxygen drops to 82 after a 2 minute walk.

## 2017-01-31 NOTE — Telephone Encounter (Signed)
Okay to order O2?

## 2017-02-01 ENCOUNTER — Emergency Department (HOSPITAL_COMMUNITY): Payer: Medicare Other

## 2017-02-01 ENCOUNTER — Encounter (HOSPITAL_COMMUNITY): Payer: Self-pay | Admitting: Emergency Medicine

## 2017-02-01 ENCOUNTER — Emergency Department (HOSPITAL_COMMUNITY)
Admission: EM | Admit: 2017-02-01 | Discharge: 2017-02-01 | Disposition: A | Discharge status: Home / Self Care | Payer: Medicare Other | Source: Home / Self Care | Attending: Emergency Medicine | Admitting: Emergency Medicine

## 2017-02-01 DIAGNOSIS — R0602 Shortness of breath: Secondary | ICD-10-CM

## 2017-02-01 DIAGNOSIS — Z79899 Other long term (current) drug therapy: Secondary | ICD-10-CM

## 2017-02-01 DIAGNOSIS — Z87891 Personal history of nicotine dependence: Secondary | ICD-10-CM | POA: Insufficient documentation

## 2017-02-01 DIAGNOSIS — R0781 Pleurodynia: Secondary | ICD-10-CM | POA: Diagnosis not present

## 2017-02-01 DIAGNOSIS — E039 Hypothyroidism, unspecified: Secondary | ICD-10-CM | POA: Insufficient documentation

## 2017-02-01 DIAGNOSIS — I5042 Chronic combined systolic (congestive) and diastolic (congestive) heart failure: Secondary | ICD-10-CM | POA: Insufficient documentation

## 2017-02-01 DIAGNOSIS — N184 Chronic kidney disease, stage 4 (severe): Secondary | ICD-10-CM

## 2017-02-01 DIAGNOSIS — N3000 Acute cystitis without hematuria: Secondary | ICD-10-CM

## 2017-02-01 DIAGNOSIS — R52 Pain, unspecified: Secondary | ICD-10-CM

## 2017-02-01 DIAGNOSIS — I130 Hypertensive heart and chronic kidney disease with heart failure and stage 1 through stage 4 chronic kidney disease, or unspecified chronic kidney disease: Secondary | ICD-10-CM

## 2017-02-01 DIAGNOSIS — S299XXA Unspecified injury of thorax, initial encounter: Secondary | ICD-10-CM | POA: Diagnosis not present

## 2017-02-01 LAB — URINALYSIS, ROUTINE W REFLEX MICROSCOPIC
Bilirubin Urine: NEGATIVE
GLUCOSE, UA: NEGATIVE mg/dL
Ketones, ur: NEGATIVE mg/dL
Nitrite: NEGATIVE
PH: 6 (ref 5.0–8.0)
SPECIFIC GRAVITY, URINE: 1.01 (ref 1.005–1.030)

## 2017-02-01 LAB — BASIC METABOLIC PANEL
Anion gap: 12 (ref 5–15)
BUN: 38 mg/dL — AB (ref 6–20)
CHLORIDE: 97 mmol/L — AB (ref 101–111)
CO2: 21 mmol/L — AB (ref 22–32)
Calcium: 8.7 mg/dL — ABNORMAL LOW (ref 8.9–10.3)
Creatinine, Ser: 1.89 mg/dL — ABNORMAL HIGH (ref 0.61–1.24)
GFR calc Af Amer: 34 mL/min — ABNORMAL LOW (ref >60)
GFR, EST NON AFRICAN AMERICAN: 29 mL/min — AB (ref >60)
GLUCOSE: 111 mg/dL — AB (ref 65–99)
POTASSIUM: 4.3 mmol/L (ref 3.5–5.1)
Sodium: 130 mmol/L — ABNORMAL LOW (ref 135–145)

## 2017-02-01 LAB — URINALYSIS, MICROSCOPIC (REFLEX): SQUAMOUS EPITHELIAL / LPF: NONE SEEN

## 2017-02-01 LAB — CBC
HEMATOCRIT: 33 % — AB (ref 39.0–52.0)
HEMOGLOBIN: 11.2 g/dL — AB (ref 13.0–17.0)
MCH: 31.3 pg (ref 26.0–34.0)
MCHC: 33.9 g/dL (ref 30.0–36.0)
MCV: 92.2 fL (ref 78.0–100.0)
Platelets: 193 10*3/uL (ref 150–400)
RBC: 3.58 MIL/uL — ABNORMAL LOW (ref 4.22–5.81)
RDW: 15.2 % (ref 11.5–15.5)
WBC: 9.6 10*3/uL (ref 4.0–10.5)

## 2017-02-01 MED ORDER — HYDROCODONE-ACETAMINOPHEN 5-325 MG PO TABS: 1.0000 | ORAL_TABLET | ORAL | 0 refills | Status: AC | PRN

## 2017-02-01 MED ORDER — ALBUTEROL SULFATE (2.5 MG/3ML) 0.083% IN NEBU
2.5000 mg | INHALATION_SOLUTION | Freq: Once | RESPIRATORY_TRACT | Status: AC
  Administered 2017-02-01: 2.5 mg via RESPIRATORY_TRACT
  Filled 2017-02-01: qty 3

## 2017-02-01 MED ORDER — CEPHALEXIN 500 MG PO CAPS: 500.0000 mg | ORAL_CAPSULE | Freq: Three times a day (TID) | ORAL | 0 refills | Status: DC

## 2017-02-01 MED ORDER — OXYCODONE-ACETAMINOPHEN 5-325 MG PO TABS
1.0000 | ORAL_TABLET | Freq: Once | ORAL | Status: AC
  Administered 2017-02-01: 1 via ORAL
  Filled 2017-02-01: qty 1

## 2017-02-01 MED ORDER — ALBUTEROL SULFATE (2.5 MG/3ML) 0.083% IN NEBU
5.0000 mg | INHALATION_SOLUTION | Freq: Once | RESPIRATORY_TRACT | Status: AC
  Administered 2017-02-01: 2.5 mg via RESPIRATORY_TRACT
  Filled 2017-02-01: qty 6

## 2017-02-01 NOTE — ED Notes (Signed)
Pt in xray

## 2017-02-01 NOTE — ED Triage Notes (Signed)
Per pt/family-states he had a fall about 3 weeks ago-followed up with PCP and was told he had some fractures in his neck-since falling he has become SOB on and off and experiencing left rib cage pain-SOB with exertion and rest

## 2017-02-02 ENCOUNTER — Emergency Department (HOSPITAL_COMMUNITY): Payer: Medicare Other

## 2017-02-02 ENCOUNTER — Inpatient Hospital Stay (HOSPITAL_COMMUNITY)
Admission: EM | Admit: 2017-02-02 | Discharge: 2017-02-09 | DRG: 291 | Disposition: A | Discharge status: Home Health | Payer: Medicare Other | Attending: Family Medicine | Admitting: Family Medicine

## 2017-02-02 ENCOUNTER — Encounter (HOSPITAL_COMMUNITY): Payer: Self-pay | Admitting: Obstetrics and Gynecology

## 2017-02-02 DIAGNOSIS — E785 Hyperlipidemia, unspecified: Secondary | ICD-10-CM | POA: Diagnosis present

## 2017-02-02 DIAGNOSIS — K224 Dyskinesia of esophagus: Secondary | ICD-10-CM | POA: Diagnosis present

## 2017-02-02 DIAGNOSIS — J189 Pneumonia, unspecified organism: Secondary | ICD-10-CM | POA: Diagnosis not present

## 2017-02-02 DIAGNOSIS — Z8249 Family history of ischemic heart disease and other diseases of the circulatory system: Secondary | ICD-10-CM

## 2017-02-02 DIAGNOSIS — Z87891 Personal history of nicotine dependence: Secondary | ICD-10-CM

## 2017-02-02 DIAGNOSIS — N3000 Acute cystitis without hematuria: Secondary | ICD-10-CM

## 2017-02-02 DIAGNOSIS — Z95810 Presence of automatic (implantable) cardiac defibrillator: Secondary | ICD-10-CM | POA: Diagnosis present

## 2017-02-02 DIAGNOSIS — E871 Hypo-osmolality and hyponatremia: Secondary | ICD-10-CM | POA: Diagnosis not present

## 2017-02-02 DIAGNOSIS — J9811 Atelectasis: Secondary | ICD-10-CM | POA: Diagnosis not present

## 2017-02-02 DIAGNOSIS — I130 Hypertensive heart and chronic kidney disease with heart failure and stage 1 through stage 4 chronic kidney disease, or unspecified chronic kidney disease: Secondary | ICD-10-CM | POA: Diagnosis not present

## 2017-02-02 DIAGNOSIS — C8519 Unspecified B-cell lymphoma, extranodal and solid organ sites: Secondary | ICD-10-CM | POA: Diagnosis present

## 2017-02-02 DIAGNOSIS — N390 Urinary tract infection, site not specified: Secondary | ICD-10-CM | POA: Diagnosis not present

## 2017-02-02 DIAGNOSIS — R509 Fever, unspecified: Secondary | ICD-10-CM

## 2017-02-02 DIAGNOSIS — R0902 Hypoxemia: Secondary | ICD-10-CM | POA: Diagnosis not present

## 2017-02-02 DIAGNOSIS — Z79899 Other long term (current) drug therapy: Secondary | ICD-10-CM

## 2017-02-02 DIAGNOSIS — J440 Chronic obstructive pulmonary disease with acute lower respiratory infection: Secondary | ICD-10-CM | POA: Diagnosis present

## 2017-02-02 DIAGNOSIS — I959 Hypotension, unspecified: Secondary | ICD-10-CM | POA: Diagnosis not present

## 2017-02-02 DIAGNOSIS — R0602 Shortness of breath: Secondary | ICD-10-CM | POA: Diagnosis not present

## 2017-02-02 DIAGNOSIS — I5043 Acute on chronic combined systolic (congestive) and diastolic (congestive) heart failure: Secondary | ICD-10-CM | POA: Diagnosis present

## 2017-02-02 DIAGNOSIS — S12091S Other nondisplaced fracture of first cervical vertebra, sequela: Secondary | ICD-10-CM

## 2017-02-02 DIAGNOSIS — Z8572 Personal history of non-Hodgkin lymphomas: Secondary | ICD-10-CM

## 2017-02-02 DIAGNOSIS — Z7951 Long term (current) use of inhaled steroids: Secondary | ICD-10-CM

## 2017-02-02 DIAGNOSIS — R0781 Pleurodynia: Secondary | ICD-10-CM | POA: Diagnosis not present

## 2017-02-02 DIAGNOSIS — N401 Enlarged prostate with lower urinary tract symptoms: Secondary | ICD-10-CM | POA: Diagnosis present

## 2017-02-02 DIAGNOSIS — R52 Pain, unspecified: Secondary | ICD-10-CM

## 2017-02-02 DIAGNOSIS — K5900 Constipation, unspecified: Secondary | ICD-10-CM | POA: Diagnosis present

## 2017-02-02 DIAGNOSIS — N183 Chronic kidney disease, stage 3 unspecified: Secondary | ICD-10-CM | POA: Diagnosis present

## 2017-02-02 DIAGNOSIS — Q613 Polycystic kidney, unspecified: Secondary | ICD-10-CM

## 2017-02-02 DIAGNOSIS — J069 Acute upper respiratory infection, unspecified: Secondary | ICD-10-CM | POA: Diagnosis present

## 2017-02-02 DIAGNOSIS — R05 Cough: Secondary | ICD-10-CM | POA: Diagnosis not present

## 2017-02-02 DIAGNOSIS — Q395 Congenital dilatation of esophagus: Secondary | ICD-10-CM

## 2017-02-02 DIAGNOSIS — E860 Dehydration: Secondary | ICD-10-CM | POA: Diagnosis present

## 2017-02-02 DIAGNOSIS — I10 Essential (primary) hypertension: Secondary | ICD-10-CM | POA: Diagnosis present

## 2017-02-02 DIAGNOSIS — K229 Disease of esophagus, unspecified: Secondary | ICD-10-CM

## 2017-02-02 DIAGNOSIS — D631 Anemia in chronic kidney disease: Secondary | ICD-10-CM | POA: Diagnosis present

## 2017-02-02 DIAGNOSIS — W19XXXS Unspecified fall, sequela: Secondary | ICD-10-CM | POA: Diagnosis present

## 2017-02-02 DIAGNOSIS — R338 Other retention of urine: Secondary | ICD-10-CM | POA: Diagnosis not present

## 2017-02-02 DIAGNOSIS — Z888 Allergy status to other drugs, medicaments and biological substances status: Secondary | ICD-10-CM

## 2017-02-02 DIAGNOSIS — F0390 Unspecified dementia without behavioral disturbance: Secondary | ICD-10-CM | POA: Diagnosis present

## 2017-02-02 DIAGNOSIS — E039 Hypothyroidism, unspecified: Secondary | ICD-10-CM | POA: Diagnosis present

## 2017-02-02 DIAGNOSIS — Z79890 Hormone replacement therapy (postmenopausal): Secondary | ICD-10-CM

## 2017-02-02 DIAGNOSIS — S299XXA Unspecified injury of thorax, initial encounter: Secondary | ICD-10-CM | POA: Diagnosis not present

## 2017-02-02 DIAGNOSIS — N189 Chronic kidney disease, unspecified: Secondary | ICD-10-CM

## 2017-02-02 DIAGNOSIS — R296 Repeated falls: Secondary | ICD-10-CM | POA: Diagnosis present

## 2017-02-02 DIAGNOSIS — N184 Chronic kidney disease, stage 4 (severe): Secondary | ICD-10-CM | POA: Diagnosis present

## 2017-02-02 DIAGNOSIS — K219 Gastro-esophageal reflux disease without esophagitis: Secondary | ICD-10-CM | POA: Diagnosis present

## 2017-02-02 DIAGNOSIS — Z885 Allergy status to narcotic agent status: Secondary | ICD-10-CM

## 2017-02-02 DIAGNOSIS — K7460 Unspecified cirrhosis of liver: Secondary | ICD-10-CM | POA: Diagnosis present

## 2017-02-02 DIAGNOSIS — I5042 Chronic combined systolic (congestive) and diastolic (congestive) heart failure: Secondary | ICD-10-CM | POA: Diagnosis present

## 2017-02-02 DIAGNOSIS — R1310 Dysphagia, unspecified: Secondary | ICD-10-CM

## 2017-02-02 DIAGNOSIS — B957 Other staphylococcus as the cause of diseases classified elsewhere: Secondary | ICD-10-CM | POA: Diagnosis present

## 2017-02-02 DIAGNOSIS — Z8711 Personal history of peptic ulcer disease: Secondary | ICD-10-CM

## 2017-02-02 LAB — CBC WITH DIFFERENTIAL/PLATELET
BASOS ABS: 0 10*3/uL (ref 0.0–0.1)
BASOS PCT: 0 %
EOS PCT: 4 %
Eosinophils Absolute: 0.5 10*3/uL (ref 0.0–0.7)
HEMATOCRIT: 36.5 % — AB (ref 39.0–52.0)
Hemoglobin: 12.5 g/dL — ABNORMAL LOW (ref 13.0–17.0)
Lymphocytes Relative: 11 %
Lymphs Abs: 1.4 10*3/uL (ref 0.7–4.0)
MCH: 31.8 pg (ref 26.0–34.0)
MCHC: 34.2 g/dL (ref 30.0–36.0)
MCV: 92.9 fL (ref 78.0–100.0)
MONO ABS: 2 10*3/uL — AB (ref 0.1–1.0)
MONOS PCT: 16 %
NEUTROS ABS: 9 10*3/uL — AB (ref 1.7–7.7)
Neutrophils Relative %: 69 %
PLATELETS: 197 10*3/uL (ref 150–400)
RBC: 3.93 MIL/uL — ABNORMAL LOW (ref 4.22–5.81)
RDW: 15.3 % (ref 11.5–15.5)
WBC: 12.9 10*3/uL — ABNORMAL HIGH (ref 4.0–10.5)

## 2017-02-02 LAB — COMPREHENSIVE METABOLIC PANEL
ALBUMIN: 3.6 g/dL (ref 3.5–5.0)
ALT: 19 U/L (ref 17–63)
ANION GAP: 11 (ref 5–15)
AST: 29 U/L (ref 15–41)
Alkaline Phosphatase: 383 U/L — ABNORMAL HIGH (ref 38–126)
BILIRUBIN TOTAL: 0.8 mg/dL (ref 0.3–1.2)
BUN: 36 mg/dL — AB (ref 6–20)
CO2: 21 mmol/L — AB (ref 22–32)
Calcium: 9 mg/dL (ref 8.9–10.3)
Chloride: 96 mmol/L — ABNORMAL LOW (ref 101–111)
Creatinine, Ser: 1.68 mg/dL — ABNORMAL HIGH (ref 0.61–1.24)
GFR calc Af Amer: 39 mL/min — ABNORMAL LOW (ref >60)
GFR, EST NON AFRICAN AMERICAN: 34 mL/min — AB (ref >60)
Glucose, Bld: 107 mg/dL — ABNORMAL HIGH (ref 65–99)
POTASSIUM: 5.1 mmol/L (ref 3.5–5.1)
Sodium: 128 mmol/L — ABNORMAL LOW (ref 135–145)
Total Protein: 7.3 g/dL (ref 6.5–8.1)

## 2017-02-02 LAB — URINALYSIS, ROUTINE W REFLEX MICROSCOPIC
BILIRUBIN URINE: NEGATIVE
Bacteria, UA: NONE SEEN
GLUCOSE, UA: NEGATIVE mg/dL
Ketones, ur: NEGATIVE mg/dL
NITRITE: NEGATIVE
Protein, ur: 30 mg/dL — AB
SPECIFIC GRAVITY, URINE: 1.011 (ref 1.005–1.030)
SQUAMOUS EPITHELIAL / LPF: NONE SEEN
pH: 5 (ref 5.0–8.0)

## 2017-02-02 LAB — I-STAT CG4 LACTIC ACID, ED: Lactic Acid, Venous: 0.94 mmol/L (ref 0.5–1.9)

## 2017-02-02 MED ORDER — VITAMIN D 1000 UNITS PO TABS
2000.0000 [IU] | ORAL_TABLET | Freq: Every day | ORAL | Status: DC
  Administered 2017-02-03 – 2017-02-09 (×4): 2000 [IU] via ORAL
  Filled 2017-02-02 (×4): qty 2

## 2017-02-02 MED ORDER — TRIAMCINOLONE ACETONIDE 0.1 % EX OINT
1.0000 "application " | TOPICAL_OINTMENT | Freq: Two times a day (BID) | CUTANEOUS | Status: DC
  Administered 2017-02-02 – 2017-02-08 (×9): 1 via TOPICAL
  Filled 2017-02-02: qty 80

## 2017-02-02 MED ORDER — TAMSULOSIN HCL 0.4 MG PO CAPS
0.4000 mg | ORAL_CAPSULE | Freq: Every evening | ORAL | Status: DC
  Administered 2017-02-02 – 2017-02-09 (×6): 0.4 mg via ORAL
  Filled 2017-02-02 (×6): qty 1

## 2017-02-02 MED ORDER — DOCUSATE SODIUM 100 MG PO CAPS
100.0000 mg | ORAL_CAPSULE | Freq: Every day | ORAL | Status: DC | PRN
  Administered 2017-02-05: 100 mg via ORAL
  Filled 2017-02-02: qty 1

## 2017-02-02 MED ORDER — DONEPEZIL HCL 10 MG PO TABS
10.0000 mg | ORAL_TABLET | Freq: Every day | ORAL | Status: DC
  Administered 2017-02-02 – 2017-02-08 (×7): 10 mg via ORAL
  Filled 2017-02-02 (×7): qty 1

## 2017-02-02 MED ORDER — ACETAMINOPHEN 650 MG RE SUPP: 650.0000 mg | Freq: Four times a day (QID) | RECTAL | Status: DC | PRN

## 2017-02-02 MED ORDER — PANTOPRAZOLE SODIUM 40 MG PO TBEC
40.0000 mg | DELAYED_RELEASE_TABLET | Freq: Every day | ORAL | Status: DC
  Administered 2017-02-03 – 2017-02-09 (×4): 40 mg via ORAL
  Filled 2017-02-02 (×4): qty 1

## 2017-02-02 MED ORDER — TRIAMCINOLONE ACETONIDE 55 MCG/ACT NA AERO
2.0000 | INHALATION_SPRAY | Freq: Every day | NASAL | Status: DC
  Administered 2017-02-03 – 2017-02-09 (×5): 2 via NASAL
  Filled 2017-02-02: qty 21.6

## 2017-02-02 MED ORDER — LEVOTHYROXINE SODIUM 112 MCG PO TABS
112.0000 ug | ORAL_TABLET | Freq: Every day | ORAL | Status: DC
  Administered 2017-02-06 – 2017-02-09 (×3): 112 ug via ORAL
  Filled 2017-02-02 (×3): qty 1

## 2017-02-02 MED ORDER — ADULT MULTIVITAMIN W/MINERALS CH
1.0000 | ORAL_TABLET | Freq: Every day | ORAL | Status: DC
  Administered 2017-02-06 – 2017-02-09 (×3): 1 via ORAL
  Filled 2017-02-02 (×3): qty 1

## 2017-02-02 MED ORDER — ONDANSETRON HCL 4 MG PO TABS: 4.0000 mg | ORAL_TABLET | Freq: Four times a day (QID) | ORAL | Status: DC | PRN

## 2017-02-02 MED ORDER — ACETAMINOPHEN 325 MG PO TABS
650.0000 mg | ORAL_TABLET | Freq: Four times a day (QID) | ORAL | Status: DC | PRN
  Administered 2017-02-03 – 2017-02-07 (×2): 650 mg via ORAL
  Filled 2017-02-02 (×2): qty 2

## 2017-02-02 MED ORDER — HYDROCODONE-ACETAMINOPHEN 5-325 MG PO TABS
1.0000 | ORAL_TABLET | ORAL | Status: DC | PRN
  Administered 2017-02-02: 1 via ORAL
  Filled 2017-02-02: qty 1

## 2017-02-02 MED ORDER — CALCIUM CARBONATE 1250 (500 CA) MG PO TABS
500.0000 mg | ORAL_TABLET | Freq: Two times a day (BID) | ORAL | Status: DC
  Administered 2017-02-03 – 2017-02-09 (×7): 500 mg via ORAL
  Filled 2017-02-02 (×7): qty 1

## 2017-02-02 MED ORDER — ONDANSETRON HCL 4 MG/2ML IJ SOLN: 4.0000 mg | Freq: Four times a day (QID) | INTRAMUSCULAR | Status: DC | PRN

## 2017-02-02 MED ORDER — SODIUM CHLORIDE 0.9 % IV SOLN
INTRAVENOUS | Status: DC
  Administered 2017-02-02: via INTRAVENOUS

## 2017-02-02 MED ORDER — ENOXAPARIN SODIUM 30 MG/0.3ML ~~LOC~~ SOLN
30.0000 mg | Freq: Every day | SUBCUTANEOUS | Status: DC
  Administered 2017-02-02 – 2017-02-03 (×2): 30 mg via SUBCUTANEOUS
  Filled 2017-02-02 (×2): qty 0.3

## 2017-02-02 MED ORDER — ALBUTEROL SULFATE (2.5 MG/3ML) 0.083% IN NEBU: 2.5000 mg | INHALATION_SOLUTION | RESPIRATORY_TRACT | Status: DC | PRN

## 2017-02-02 MED ORDER — NITROGLYCERIN 0.4 MG SL SUBL
0.4000 mg | SUBLINGUAL_TABLET | SUBLINGUAL | Status: DC | PRN
  Administered 2017-02-03: 0.4 mg via SUBLINGUAL
  Filled 2017-02-02: qty 1

## 2017-02-02 MED ORDER — DEXTROSE 5 % IV SOLN
1.0000 g | Freq: Once | INTRAVENOUS | Status: AC
  Administered 2017-02-02: 1 g via INTRAVENOUS
  Filled 2017-02-02: qty 10

## 2017-02-02 MED ORDER — HYDROCODONE-ACETAMINOPHEN 5-325 MG PO TABS
1.0000 | ORAL_TABLET | Freq: Once | ORAL | Status: AC
  Administered 2017-02-02: 1 via ORAL
  Filled 2017-02-02: qty 1

## 2017-02-02 MED ORDER — ATORVASTATIN CALCIUM 10 MG PO TABS
10.0000 mg | ORAL_TABLET | Freq: Two times a day (BID) | ORAL | Status: DC
  Administered 2017-02-02 – 2017-02-09 (×10): 10 mg via ORAL
  Filled 2017-02-02 (×10): qty 1

## 2017-02-02 MED ORDER — SODIUM CHLORIDE 0.9 % IV BOLUS (SEPSIS)
1000.0000 mL | Freq: Once | INTRAVENOUS | Status: AC
  Administered 2017-02-02: 1000 mL via INTRAVENOUS

## 2017-02-02 NOTE — ED Triage Notes (Signed)
Pt BIB EMS- EMS reports pt was seen yesterday and dx with UTI. Send home with abx. Today pt began to experience chills, and EMS states pt is warm upon touch. Pt A/Ox4. No N/V/D

## 2017-02-02 NOTE — ED Provider Notes (Signed)
[facility] DEPT Provider Note   CSN: [id number] Arrival date & time: 02/01/17  6789     History   Chief Complaint Chief Complaint  Patient presents with  . Shortness of Breath    HPI [name] [name] is a 83 y.o. male.  HPI Patient is a 83 year old male with a history of congestive heart failure who presents to the emergency department complaints of fluctuating shortness of breath over the past several days.  He reports some tenderness over his left lateral chest.  He states this is been worse since a fall approximately 3 weeks ago.  At that time he did end up with a cervical spine fracture which is being treated nonoperatively by the neurosurgical team.  He denies weakness of his arms or legs.  Denies abdominal pain.  No fevers or chills.  Denies productive cough.  No history of DVT or pulmonary embolism.  Symptoms are mild in severity.  No significant shortness of breath at rest.  Was able to walk back from the waiting room to bed 18 in the emergency department   Past Medical History:  Diagnosis Date  . Cancer ([place])    Stomach  . CHF (congestive heart failure) ([place])   . Collagen vascular disease ([place])   . Hypertension     Patient Active Problem List   Diagnosis Date Noted  . Neck pain 01/09/2017  . Acute nonintractable headache 01/09/2017  . Head injury 01/09/2017  . Closed nondisplaced fracture of first cervical vertebra ([place]) 01/09/2017  . Liver cirrhosis ([place]) 06/17/2016  . Chronic kidney disease, stage IV (severe) ([place]) 06/17/2016  . Dry skin dermatitis 06/12/2016  . Preventive measure 12/18/2015  . Elevated alkaline phosphatase level 12/18/2015  . Rash, skin 08/26/2015  . Diarrhea 05/19/2015  . UTI (urinary tract infection) 05/19/2015  . Monocytosis 05/12/2015  . Urinary tract infection, site not specified 04/06/2015  . Stomach pain 04/06/2015  . Cough 03/28/2015  . Pressure ulcer 03/13/2015  . Dyslipidemia 03/12/2015  .  Leukocytosis 03/12/2015  . Chronic combined systolic and diastolic CHF (congestive heart failure) ([place]) 03/12/2015  . CKD (chronic kidney disease) stage 3, GFR 30-59 ml/min (HCC) 03/12/2015  . Acute respiratory failure with hypoxemia ([place]) 03/12/2015  . Right femoral fracture, closed, initial encounter 03/12/2015  . Fracture of proximal end of right femur ([place]) 03/11/2015  . History of B-cell lymphoma 10/27/2014  . Thrombocytopenia ([place]) 10/26/2014  . Anemia in chronic renal disease 06/18/2014  . Automatic implantable cardioverter-defibrillator in situ 12/30/2008  . Memory loss 06/21/2008  . LOW BACK PAIN 06/15/2007  . Hypothyroidism 01/08/2007  . Essential hypertension 01/08/2007    Past Surgical History:  Procedure Laterality Date  . APPENDECTOMY    . COLONOSCOPY  12/1998, 02/2004   diverticulosois, external hemorrhoids  . CRANIOTOMY Left   . ESOPHAGOGASTRODUODENOSCOPY  06/17/2014   Dr. Oneida Alar: 1. Patent stricture at the gastroesophageal junction 2. UGIB Due to multiple  gastric ulcers 3. Moderate Duodentitis. Negative H.pylori  . heart disease     permanent pacemaker  . HIP FRACTURE SURGERY  2011   OR[facility]  R.  . INGUINAL HERNIA REPAIR    . IR GENERIC HISTORICAL  10/26/2015   IR RADIOLOGIST EVAL & MGMT 10/26/2015 [facility]  . PACEMAKER INSERTION         Home Medications    Prior to Admission medications   Medication Sig Start Date End Date Taking? Authorizing Provider  acetaminophen (TYLENOL) 500 MG tablet Take 500 mg by mouth  every 6 (six) hours as needed for headache.   Yes [provider]  albuterol (PROVENTIL) (2.5 MG/3ML) 0.083% nebulizer solution USE ONE VIAL VIA NEBULIZER FOUR TIMES DAILY AS NEEDED. 11/22/16  Yes Plotnikov, Evie Lacks, MD  Artificial Tear Ointment (DRY EYES OP) Apply 1 drop to eye daily.   Yes [provider]  atorvastatin (LIPITOR) 20 MG tablet TAKE 1/2 TABLET BY MOUTH TWICE DAILY 01/02/17  Yes Plotnikov, Evie Lacks, MD  calcium  carbonate (OSCAL) 1500 (600 Ca) MG TABS tablet Take 600 mg of elemental calcium 2 (two) times daily with a meal by mouth.   Yes [provider]  Cholecalciferol (VITAMIN D) 2000 units CAPS Take 2,000 Units daily by mouth.   Yes [provider]  diltiazem 2 % GEL Apply 1 application topically 3 (three) times daily. 06/12/16  Yes Plotnikov, Evie Lacks, MD  diphenoxylate-atropine (LOMOTIL) 2.5-0.025 MG tablet Take 1 tablet by mouth 4 (four) times daily as needed for diarrhea or loose stools. 06/07/15  Yes Plotnikov, Evie Lacks, MD  docusate sodium (COLACE) 100 MG capsule Take 100 mg daily as needed by mouth for mild constipation.   Yes [provider]  donepezil (ARICEPT) 10 MG tablet Take 1 tablet (10 mg total) by mouth at bedtime. 12/20/15 02/01/17 Yes Plotnikov, Evie Lacks, MD  fluticasone (FLONASE) 50 MCG/ACT nasal spray Place 2 sprays into both nostrils daily. 05/26/14  Yes Plotnikov, Evie Lacks, MD  levothyroxine (SYNTHROID, LEVOTHROID) 112 MCG tablet TAKE 1 TABLET BY MOUTH DAILY 12/12/16  Yes Plotnikov, Evie Lacks, MD  multivitamin-iron-minerals-folic acid (CENTRUM) chewable tablet Chew 1 tablet by mouth daily.   Yes [provider]  nitroGLYCERIN (NITROSTAT) 0.4 MG SL tablet Place 1 tablet (0.4 mg total) under the tongue every 5 (five) minutes as needed. Chest pain 03/13/16 03/12/18 Yes Plotnikov, Evie Lacks, MD  pantoprazole (PROTONIX) 40 MG tablet Take 1 tablet (40 mg total) by mouth daily. 11/05/16  Yes Plotnikov, Evie Lacks, MD  tamsulosin (FLOMAX) 0.4 MG CAPS capsule Take 0.4 mg every evening by mouth. 01/02/17  Yes [provider]  triamcinolone (NASACORT AQ) 55 MCG/ACT AERO nasal inhaler Place 2 sprays into the nose daily. 12/20/15  Yes Plotnikov, Evie Lacks, MD  triamcinolone ointment (KENALOG) 0.1 % Apply 1 application topically 2 (two) times daily. 06/12/16  Yes Plotnikov, Evie Lacks, MD  AMBULATORY NON FORMULARY MEDICATION Diltiazem gel 2% Apply small amount  pea-sized to anal area 2-3 times a day for anal stenosis. Use as needed. Patient not taking: Reported on 02/01/2017 06/07/15   Plotnikov, Evie Lacks, MD  cephALEXin (KEFLEX) 500 MG capsule Take 1 capsule (500 mg total) 3 (three) times daily by mouth. 02/01/17   Jola Schmidt, MD  HYDROcodone-acetaminophen (NORCO/VICODIN) 5-325 MG tablet Take 1 tablet every 4 (four) hours as needed by mouth for moderate pain. 02/01/17   Jola Schmidt, MD    Family History Family History  Problem Relation Age of Onset  . Cancer Sister   . Coronary artery disease Unknown        male 1st degree relative<60  . Hypertension Unknown     Social History Social History   Tobacco Use  . Smoking status: Former Smoker    Packs/day: 1.00    Years: 30.00    Pack years: 30.00    Last attempt to quit: 10/26/1976    Years since quitting: 40.2  . Smokeless tobacco: Never Used  Substance Use Topics  . Alcohol use: No  . Drug use: No  Allergies   Aleve [naproxen sodium]; Ibuprofen; and Tramadol   Review of Systems Review of Systems  All other systems reviewed and are negative.    Physical Exam Updated Vital Signs BP 130/80   Pulse 65   Resp (!) 22   SpO2 96%   Physical Exam  Constitutional: He is oriented to person, place, and time. He appears well-developed and well-nourished.  HENT:  Head: Normocephalic and atraumatic.  Eyes: EOM are normal.  Neck: Normal range of motion.  Cardiovascular: Normal rate, regular rhythm, normal heart sounds and intact distal pulses.  Pulmonary/Chest: Effort normal and breath sounds normal. No respiratory distress.  Tenderness left lateral chest wall without crepitus.  No bruising.  Abdominal: Soft. He exhibits no distension. There is no tenderness.  Musculoskeletal: Normal range of motion.  Neurological: He is alert and oriented to person, place, and time.  Skin: Skin is warm and dry.  Psychiatric: He has a normal mood and affect. Judgment normal.  Nursing  note and vitals reviewed.    ED Treatments / Results  Labs (all labs ordered are listed, but only abnormal results are displayed) Labs Reviewed  CBC - Abnormal; Notable for the following components:      Result Value   RBC 3.58 (*)    Hemoglobin 11.2 (*)    HCT 33.0 (*)    All other components within normal limits  BASIC METABOLIC PANEL - Abnormal; Notable for the following components:   Sodium 130 (*)    Chloride 97 (*)    CO2 21 (*)    Glucose, Bld 111 (*)    BUN 38 (*)    Creatinine, Ser 1.89 (*)    Calcium 8.7 (*)    GFR calc non Af Amer 29 (*)    GFR calc Af Amer 34 (*)    All other components within normal limits  URINALYSIS, ROUTINE W REFLEX MICROSCOPIC - Abnormal; Notable for the following components:   APPearance CLOUDY (*)    Hgb urine dipstick MODERATE (*)    Protein, ur TRACE (*)    Leukocytes, UA LARGE (*)    All other components within normal limits  URINALYSIS, MICROSCOPIC (REFLEX) - Abnormal; Notable for the following components:   Bacteria, UA MANY (*)    All other components within normal limits  URINE CULTURE    EKG  EKG Interpretation  Date/Time:  Saturday February 01 2017 10:38:59 EST Ventricular Rate:  61 PR Interval:    QRS Duration: 172 QT Interval:  505 QTC Calculation: 509 R Axis:   49 Text Interpretation:  Atrial-paced rhythm IVCD, consider atypical LBBB No significant change was found Confirmed by Jola Schmidt [phone]) on 02/01/2017 12:06:12 PM       Radiology Dg Chest 2 View  Result Date: 02/01/2017 CLINICAL DATA:  Increasingly short of breath since fall 3 weeks ago. EXAM: CHEST  2 VIEW COMPARISON:  05/15/2015 FINDINGS: The heart remains normal in size. Aorta rib is tortuous. Mediastinum is rotated to the right limiting evaluation of the mediastinum. Lungs are under aerated. Chronic interstitial changes. Calcified granulomata at the right lung base. Stable thoracic spine. Osteopenia. No pneumothorax or pleural effusion. AICD device  is unchanged via left subclavian vein. Chronic sternal deformity. IMPRESSION: No active cardiopulmonary disease. Electronically Signed   By: Marybelle Killings M.D.   On: 02/01/2017 11:54   Dg Ribs Unilateral Left  Result Date: 02/01/2017 CLINICAL DATA:  Increasing SOB since fall 3 weeks ago; left lower rib pain x 3 weeks; hx  gastric CA, CHF, HTN; ex smoker quit 40 years ago; EXAM: LEFT RIBS - 2 VIEW COMPARISON:  Current chest radiograph. Previous chest radiograph, 05/15/2015. FINDINGS: No rib fracture or rib lesion.  Bony thorax is demineralized. No left lung consolidation. No pleural effusion or evidence of a pneumothorax. IMPRESSION: No rib fracture or rib lesion. Electronically Signed   By: Lajean Manes M.D.   On: 02/01/2017 11:50    Procedures Procedures (including critical care time)  Medications Ordered in ED Medications  albuterol (PROVENTIL) (2.5 MG/3ML) 0.083% nebulizer solution 5 mg (2.5 mg Nebulization Given 02/01/17 1054)  albuterol (PROVENTIL) (2.5 MG/3ML) 0.083% nebulizer solution 2.5 mg (2.5 mg Nebulization Given 02/01/17 1058)  oxyCODONE-acetaminophen (PERCOCET/ROXICET) 5-325 MG per tablet 1 tablet (1 tablet Oral Given 02/01/17 1229)     Initial Impression / Assessment and Plan / ED Course  I have reviewed the triage vital signs and the nursing notes.  Pertinent labs & imaging results that were available during my care of the patient were reviewed by me and considered in my medical decision making (see chart for details).     Patient and family reports some painful urination described over the past day or 2.  Urine culture sent.  Home with antibiotics for urinary tract infection.  No hypoxia in the ER.  Chest x-ray without abnormality.  No rib fractures noted.  Low suspicion for pulmonary embolism.  Patient feels somewhat improved after bronchodilators in the ER.  He has bronchodilators at home.  Outpatient primary care follow-up.  He understands return to the ER for new or  worsening symptoms.  I do not think he needs additional workup or acute hospitalization at this time.  Final Clinical Impressions(s) / ED Diagnoses   Final diagnoses:  Pain  SOB (shortness of breath)  Acute cystitis without hematuria    ED Discharge Orders        Ordered    cephALEXin (KEFLEX) 500 MG capsule  3 times daily     02/01/17 1438    HYDROcodone-acetaminophen (NORCO/VICODIN) 5-325 MG tablet  Every 4 hours PRN     02/01/17 [place], Yaslene Lindamood, MD 02/02/17 [phone]

## 2017-02-02 NOTE — Telephone Encounter (Signed)
Ok Thx 

## 2017-02-02 NOTE — Progress Notes (Signed)
Lovenox per Pharmacy for DVT Prophylaxis    Pharmacy has been consulted from dosing enoxaparin (lovenox) in this patient for DVT prophylaxis.  The pharmacist has reviewed pertinent labs (Hgb _12.5__; PLT_197__), patient weight (_65.8__kg) and renal function (CrCl__23.5_mL/min) and decided that enoxaparin _30_mg SQ Q24Hrs is appropriate for this patient.  The pharmacy department will sign off at this time.  Please reconsult pharmacy if status changes or for further issues.  Thank you  Cyndia Diver PharmD, BCPS  02/02/2017, 10:32 PM

## 2017-02-02 NOTE — ED Provider Notes (Signed)
[facility] DEPT Provider Note   CSN: [id number] Arrival date & time: 02/02/17  1801     History   Chief Complaint Chief Complaint  Patient presents with  . Chills  . Cough    HPI [name] [name] is a 83 y.o. male hx of CHF, HTN, here presenting with chills, shortness of breath.  Patient resides at an assisted living.  He was seen yesterday and was diagnosed with urinary tract infection and was prescribed keflex.  Patient states that he walked back from the dining hall today and had some chills and subjective shortness of breath.  Did not take his temperature at the facility.   The history is provided by the patient.    Past Medical History:  Diagnosis Date  . Cancer ([place])    Stomach  . CHF (congestive heart failure) ([place])   . Collagen vascular disease ([place])   . Hypertension     Patient Active Problem List   Diagnosis Date Noted  . Neck pain 01/09/2017  . Acute nonintractable headache 01/09/2017  . Head injury 01/09/2017  . Closed nondisplaced fracture of first cervical vertebra ([place]) 01/09/2017  . Liver cirrhosis ([place]) 06/17/2016  . Chronic kidney disease, stage IV (severe) ([place]) 06/17/2016  . Dry skin dermatitis 06/12/2016  . Preventive measure 12/18/2015  . Elevated alkaline phosphatase level 12/18/2015  . Rash, skin 08/26/2015  . Diarrhea 05/19/2015  . UTI (urinary tract infection) 05/19/2015  . Monocytosis 05/12/2015  . Urinary tract infection, site not specified 04/06/2015  . Stomach pain 04/06/2015  . Cough 03/28/2015  . Pressure ulcer 03/13/2015  . Dyslipidemia 03/12/2015  . Leukocytosis 03/12/2015  . Chronic combined systolic and diastolic CHF (congestive heart failure) ([place]) 03/12/2015  . CKD (chronic kidney disease) stage 3, GFR 30-59 ml/min (HCC) 03/12/2015  . Acute respiratory failure with hypoxemia ([place]) 03/12/2015  . Right femoral fracture, closed, initial encounter 03/12/2015  . Fracture of proximal end of right  femur ([place]) 03/11/2015  . History of B-cell lymphoma 10/27/2014  . Thrombocytopenia ([place]) 10/26/2014  . Anemia in chronic renal disease 06/18/2014  . Automatic implantable cardioverter-defibrillator in situ 12/30/2008  . Memory loss 06/21/2008  . LOW BACK PAIN 06/15/2007  . Hypothyroidism 01/08/2007  . Essential hypertension 01/08/2007    Past Surgical History:  Procedure Laterality Date  . APPENDECTOMY    . COLONOSCOPY  12/1998, 02/2004   diverticulosois, external hemorrhoids  . CRANIOTOMY Left   . ESOPHAGOGASTRODUODENOSCOPY  06/17/2014   Dr. Oneida Alar: 1. Patent stricture at the gastroesophageal junction 2. UGIB Due to multiple  gastric ulcers 3. Moderate Duodentitis. Negative H.pylori  . heart disease     permanent pacemaker  . HIP FRACTURE SURGERY  2011   ORIF  R.  . INGUINAL HERNIA REPAIR    . IR GENERIC HISTORICAL  10/26/2015   IR RADIOLOGIST EVAL & MGMT 10/26/2015 [facility]  . PACEMAKER INSERTION         Home Medications    Prior to Admission medications   Medication Sig Start Date End Date Taking? Authorizing Provider  acetaminophen (TYLENOL) 500 MG tablet Take 500 mg by mouth every 6 (six) hours as needed for headache.    [provider]  albuterol (PROVENTIL) (2.5 MG/3ML) 0.083% nebulizer solution USE ONE VIAL VIA NEBULIZER FOUR TIMES DAILY AS NEEDED. 11/22/16   Plotnikov, Evie Lacks, MD  AMBULATORY NON FORMULARY MEDICATION Diltiazem gel 2% Apply small amount pea-sized to anal area 2-3 times a day for anal stenosis. Use as  needed. Patient not taking: Reported on 02/01/2017 06/07/15   Plotnikov, Evie Lacks, MD  Artificial Tear Ointment (DRY EYES OP) Apply 1 drop to eye daily.    [provider]  atorvastatin (LIPITOR) 20 MG tablet TAKE 1/2 TABLET BY MOUTH TWICE DAILY 01/02/17   Plotnikov, Evie Lacks, MD  calcium carbonate (OSCAL) 1500 (600 Ca) MG TABS tablet Take 600 mg of elemental calcium 2 (two) times daily with a meal by mouth.    [provider]  cephALEXin (KEFLEX) 500 MG capsule Take 1 capsule (500 mg total) 3 (three) times daily by mouth. 02/01/17   Jola Schmidt, MD  Cholecalciferol (VITAMIN D) 2000 units CAPS Take 2,000 Units daily by mouth.    [provider]  diltiazem 2 % GEL Apply 1 application topically 3 (three) times daily. 06/12/16   Plotnikov, Evie Lacks, MD  diphenoxylate-atropine (LOMOTIL) 2.5-0.025 MG tablet Take 1 tablet by mouth 4 (four) times daily as needed for diarrhea or loose stools. 06/07/15   Plotnikov, Evie Lacks, MD  docusate sodium (COLACE) 100 MG capsule Take 100 mg daily as needed by mouth for mild constipation.    [provider]  donepezil (ARICEPT) 10 MG tablet Take 1 tablet (10 mg total) by mouth at bedtime. 12/20/15 02/01/17  Plotnikov, Evie Lacks, MD  fluticasone (FLONASE) 50 MCG/ACT nasal spray Place 2 sprays into both nostrils daily. 05/26/14   Plotnikov, Evie Lacks, MD  HYDROcodone-acetaminophen (NORCO/VICODIN) 5-325 MG tablet Take 1 tablet every 4 (four) hours as needed by mouth for moderate pain. 02/01/17   Jola Schmidt, MD  levothyroxine (SYNTHROID, LEVOTHROID) 112 MCG tablet TAKE 1 TABLET BY MOUTH DAILY 12/12/16   Plotnikov, Evie Lacks, MD  multivitamin-iron-minerals-folic acid (CENTRUM) chewable tablet Chew 1 tablet by mouth daily.    [provider]  nitroGLYCERIN (NITROSTAT) 0.4 MG SL tablet Place 1 tablet (0.4 mg total) under the tongue every 5 (five) minutes as needed. Chest pain 03/13/16 03/12/18  Plotnikov, Evie Lacks, MD  pantoprazole (PROTONIX) 40 MG tablet Take 1 tablet (40 mg total) by mouth daily. 11/05/16   Plotnikov, Evie Lacks, MD  tamsulosin (FLOMAX) 0.4 MG CAPS capsule Take 0.4 mg every evening by mouth. 01/02/17   [provider]  triamcinolone (NASACORT AQ) 55 MCG/ACT AERO nasal inhaler Place 2 sprays into the nose daily. 12/20/15   Plotnikov, Evie Lacks, MD  triamcinolone ointment (KENALOG) 0.1 % Apply 1 application topically 2 (two) times  daily. 06/12/16   Plotnikov, Evie Lacks, MD    Family History Family History  Problem Relation Age of Onset  . Cancer Sister   . Coronary artery disease Unknown        male 1st degree relative<60  . Hypertension Unknown     Social History Social History   Tobacco Use  . Smoking status: Former Smoker    Packs/day: 1.00    Years: 30.00    Pack years: 30.00    Last attempt to quit: 10/26/1976    Years since quitting: 40.2  . Smokeless tobacco: Never Used  Substance Use Topics  . Alcohol use: No  . Drug use: No     Allergies   Aleve [naproxen sodium]; Ibuprofen; and Tramadol   Review of Systems Review of Systems  Constitutional: Positive for chills.  Respiratory: Positive for cough.   All other systems reviewed and are negative.    Physical Exam Updated Vital Signs BP (!) 173/83 (BP Location: Left Arm)   Pulse 64   Temp 97.9 F (36.6  C) (Rectal)   Resp 17   Ht 5\' 4"  (1.626 m)   Wt 65.8 kg (145 lb)   SpO2 93%   BMI 24.89 kg/m   Physical Exam  Constitutional:  Chronically ill   HENT:  Head: Normocephalic.  Mouth/Throat: Oropharynx is clear and moist.  Eyes: Conjunctivae and EOM are normal. Pupils are equal, round, and reactive to light.  Neck: Normal range of motion. Neck supple.  Cardiovascular: Normal rate, regular rhythm and normal heart sounds.  Pulmonary/Chest: Effort normal and breath sounds normal. No stridor. No respiratory distress. He has no wheezes.  Abdominal: Soft. Bowel sounds are normal. He exhibits no distension. There is no tenderness.  Musculoskeletal: Normal range of motion.  Neurological: He is alert.  Nursing note and vitals reviewed.    ED Treatments / Results  Labs (all labs ordered are listed, but only abnormal results are displayed) Labs Reviewed  CBC WITH DIFFERENTIAL/PLATELET - Abnormal; Notable for the following components:      Result Value   WBC 12.9 (*)    RBC 3.93 (*)    Hemoglobin 12.5 (*)    HCT 36.5 (*)     Neutro Abs 9.0 (*)    Monocytes Absolute 2.0 (*)    All other components within normal limits  COMPREHENSIVE METABOLIC PANEL - Abnormal; Notable for the following components:   Sodium 128 (*)    Chloride 96 (*)    CO2 21 (*)    Glucose, Bld 107 (*)    BUN 36 (*)    Creatinine, Ser 1.68 (*)    Alkaline Phosphatase 383 (*)    GFR calc non Af Amer 34 (*)    GFR calc Af Amer 39 (*)    All other components within normal limits  URINALYSIS, ROUTINE W REFLEX MICROSCOPIC - Abnormal; Notable for the following components:   APPearance CLOUDY (*)    Hgb urine dipstick SMALL (*)    Protein, ur 30 (*)    Leukocytes, UA LARGE (*)    All other components within normal limits  CULTURE, BLOOD (ROUTINE X 2)  CULTURE, BLOOD (ROUTINE X 2)  URINE CULTURE  I-STAT CG4 LACTIC ACID, ED    EKG  EKG Interpretation None       Radiology Dg Chest 2 View  Result Date: 02/01/2017 CLINICAL DATA:  Increasingly short of breath since fall 3 weeks ago. EXAM: CHEST  2 VIEW COMPARISON:  05/15/2015 FINDINGS: The heart remains normal in size. Aorta rib is tortuous. Mediastinum is rotated to the right limiting evaluation of the mediastinum. Lungs are under aerated. Chronic interstitial changes. Calcified granulomata at the right lung base. Stable thoracic spine. Osteopenia. No pneumothorax or pleural effusion. AICD device is unchanged via left subclavian vein. Chronic sternal deformity. IMPRESSION: No active cardiopulmonary disease. Electronically Signed   By: Marybelle Killings M.D.   On: 02/01/2017 11:54   Dg Ribs Unilateral Left  Result Date: 02/01/2017 CLINICAL DATA:  Increasing SOB since fall 3 weeks ago; left lower rib pain x 3 weeks; hx gastric CA, CHF, HTN; ex smoker quit 40 years ago; EXAM: LEFT RIBS - 2 VIEW COMPARISON:  Current chest radiograph. Previous chest radiograph, 05/15/2015. FINDINGS: No rib fracture or rib lesion.  Bony thorax is demineralized. No left lung consolidation. No pleural effusion or  evidence of a pneumothorax. IMPRESSION: No rib fracture or rib lesion. Electronically Signed   By: Lajean Manes M.D.   On: 02/01/2017 11:50   Dg Chest Port 1 View  Result Date:  02/02/2017 CLINICAL DATA:  82 year old male with cough and fever. EXAM: PORTABLE CHEST 1 VIEW COMPARISON:  Chest radiograph dated 02/01/2017 FINDINGS: There is shallow inspiration with bibasilar linear atelectasis/ scarring. Stable calcified granuloma at the right lung base. No focal consolidation, pleural effusion, or pneumothorax. The cardiac silhouette is within normal limits. There is atherosclerotic calcification of the aorta. Left pectoral dual lead AICD device. There is osteopenia with degenerative changes of the spine. No acute osseous pathology. IMPRESSION: No active disease. Electronically Signed   By: Anner Crete M.D.   On: 02/02/2017 19:10    Procedures Procedures (including critical care time)  Medications Ordered in ED Medications  cefTRIAXone (ROCEPHIN) 1 g in dextrose 5 % 50 mL IVPB (not administered)  sodium chloride 0.9 % bolus 1,000 mL (1,000 mLs Intravenous New Bag/Given 02/02/17 1936)  HYDROcodone-acetaminophen (NORCO/VICODIN) 5-325 MG per tablet 1 tablet (1 tablet Oral Given 02/02/17 1936)     Initial Impression / Assessment and Plan / ED Course  I have reviewed the triage vital signs and the nursing notes.  Pertinent labs & imaging results that were available during my care of the patient were reviewed by me and considered in my medical decision making (see chart for details).    [name] [name] is a 83 y.o. male here with weakness, chills. Was diagnosed with UTI yesterday. Will do sepsis workup.   8:38 PM WBC increased to 13. UA still showed leuk and too many to count WBCs. Given rocephin, IVF. Will admit for UTI with failure of outpatient abx.    Final Clinical Impressions(s) / ED Diagnoses   Final diagnoses:  None    ED Discharge Orders    None       Drenda Freeze, MD 02/02/17 2039

## 2017-02-02 NOTE — H&P (Signed)
History and Physical    [name] [name] MVH:[id number] DOB: Jul 26, 1922 DOA: 02/02/2017  PCP: Cassandria Anger, MD  Patient coming from: Assisted living facility.  Chief Complaint: Fever chills.  HPI: [name] [name] is a 83 y.o. male with history of chronic systolic heart failure status post ICD placement, history of sinus node dysfunction status post pacemaker placement, history of B-cell lymphoma in remission, chronic kidney disease stage IV, chronic anemia hypothyroidism cirrhosis of liver, hypertension presents to the ER for the second time in 48 hours for fever and chills.  Patient has come to the ER 2 days ago with dysuria shortness of breath and fever chills.  At that time patient was diagnosed with UTI chest x-ray was unremarkable and patient was discharged back on antibiotics.  Despite taking which patient started developing some shortness of breath cough and fever chills and weakness this afternoon.  Patient came back to the ER.  ED Course: In the ER UA shows features consistent with UTI chest x-ray was unremarkable.  Patient was looking weak generally.  Admitted for further observation.  Patient states 3 weeks ago he had a spine fracture which was managed conservatively.  Denies any neck pain and has no focal deficits.  Review of Systems: As per HPI, rest all negative.   Past Medical History:  Diagnosis Date  . Cancer ([place])    Stomach  . CHF (congestive heart failure) ([place])   . Collagen vascular disease ([place])   . Hypertension     Past Surgical History:  Procedure Laterality Date  . APPENDECTOMY    . COLONOSCOPY  12/1998, 02/2004   diverticulosois, external hemorrhoids  . CRANIOTOMY Left   . ESOPHAGOGASTRODUODENOSCOPY  06/17/2014   Dr. Oneida Alar: 1. Patent stricture at the gastroesophageal junction 2. UGIB Due to multiple  gastric ulcers 3. Moderate Duodentitis. Negative H.pylori  . heart disease     permanent pacemaker  . HIP FRACTURE SURGERY  2011   [facility]IF  R.  .  INGUINAL HERNIA REPAIR    . IR GENERIC HISTORICAL  10/26/2015   IR RADIOLOGIST EVAL & MGMT 10/26/2015 [facility]  . PACEMAKER INSERTION       reports that he quit smoking about 40 years ago. He has a 30.00 pack-year smoking history. he has never used smokeless tobacco. He reports that he does not drink alcohol or use drugs.  Allergies  Allergen Reactions  . Aleve [Naproxen Sodium] Other (See Comments)    Bleeding ulcers  . Ibuprofen Other (See Comments)    Bleeding ulcers  . Tramadol     Hallucinations     Family History  Problem Relation Age of Onset  . Cancer Sister   . Coronary artery disease Unknown        male 1st degree relative<60  . Hypertension Unknown     Prior to Admission medications   Medication Sig Start Date End Date Taking? Authorizing Provider  albuterol (PROVENTIL) (2.5 MG/3ML) 0.083% nebulizer solution USE ONE VIAL VIA NEBULIZER FOUR TIMES DAILY AS NEEDED. 11/22/16  Yes Plotnikov, Evie Lacks, MD  atorvastatin (LIPITOR) 20 MG tablet TAKE 1/2 TABLET BY MOUTH TWICE DAILY 01/02/17  Yes Plotnikov, Evie Lacks, MD  calcium carbonate (OSCAL) 1500 (600 Ca) MG TABS tablet Take 600 mg of elemental calcium 2 (two) times daily with a meal by mouth.   Yes [provider]  cephALEXin (KEFLEX) 500 MG capsule Take 1 capsule (500 mg total) 3 (three) times daily by mouth. 02/01/17  Yes Jola Schmidt,  MD  Cholecalciferol (VITAMIN D) 2000 units CAPS Take 2,000 Units daily by mouth.   Yes [provider]  diltiazem 2 % GEL Apply 1 application topically 3 (three) times daily. 06/12/16  Yes Plotnikov, Evie Lacks, MD  diphenoxylate-atropine (LOMOTIL) 2.5-0.025 MG tablet Take 1 tablet by mouth 4 (four) times daily as needed for diarrhea or loose stools. 06/07/15  Yes Plotnikov, Evie Lacks, MD  docusate sodium (COLACE) 100 MG capsule Take 100 mg daily as needed by mouth for mild constipation.   Yes [provider]  donepezil (ARICEPT) 10 MG tablet Take 1 tablet (10  mg total) by mouth at bedtime. 12/20/15 02/02/17 Yes Plotnikov, Evie Lacks, MD  HYDROcodone-acetaminophen (NORCO/VICODIN) 5-325 MG tablet Take 1 tablet every 4 (four) hours as needed by mouth for moderate pain. 02/01/17  Yes Jola Schmidt, MD  levothyroxine (SYNTHROID, LEVOTHROID) 112 MCG tablet TAKE 1 TABLET BY MOUTH DAILY 12/12/16  Yes Plotnikov, Evie Lacks, MD  multivitamin-iron-minerals-folic acid (CENTRUM) chewable tablet Chew 1 tablet by mouth daily.   Yes [provider]  pantoprazole (PROTONIX) 40 MG tablet Take 1 tablet (40 mg total) by mouth daily. 11/05/16  Yes Plotnikov, Evie Lacks, MD  tamsulosin (FLOMAX) 0.4 MG CAPS capsule Take 0.4 mg every evening by mouth. 01/02/17  Yes [provider]  triamcinolone (NASACORT AQ) 55 MCG/ACT AERO nasal inhaler Place 2 sprays into the nose daily. 12/20/15  Yes Plotnikov, Evie Lacks, MD  triamcinolone ointment (KENALOG) 0.1 % Apply 1 application topically 2 (two) times daily. 06/12/16  Yes Plotnikov, Evie Lacks, MD  acetaminophen (TYLENOL) 500 MG tablet Take 500 mg by mouth every 6 (six) hours as needed for headache.    [provider]  AMBULATORY NON FORMULARY MEDICATION Diltiazem gel 2% Apply small amount pea-sized to anal area 2-3 times a day for anal stenosis. Use as needed. Patient not taking: Reported on 02/01/2017 06/07/15   Plotnikov, Evie Lacks, MD  Artificial Tear Ointment (DRY EYES OP) Apply 1 drop to eye daily.    [provider]  fluticasone (FLONASE) 50 MCG/ACT nasal spray Place 2 sprays into both nostrils daily. Patient not taking: Reported on 02/02/2017 05/26/14   Plotnikov, Evie Lacks, MD  nitroGLYCERIN (NITROSTAT) 0.4 MG SL tablet Place 1 tablet (0.4 mg total) under the tongue every 5 (five) minutes as needed. Chest pain 03/13/16 03/12/18  Plotnikov, Evie Lacks, MD    Physical Exam: Vitals:   02/02/17 1824 02/02/17 1825 02/02/17 1832 02/02/17 2049  BP:    (!) 159/68  Pulse:    (!) 101  Resp:    20  Temp:   97.8 F (36.6 C) 97.9 F (36.6 C) 97.8 F (36.6 C)  TempSrc:  Oral Rectal   SpO2: 93%   100%  Weight:  65.8 kg (145 lb)    Height:  5\' 4"  (1.626 m)        Constitutional: Moderately built and nourished. Vitals:   02/02/17 1824 02/02/17 1825 02/02/17 1832 02/02/17 2049  BP:    (!) 159/68  Pulse:    (!) 101  Resp:    20  Temp:  97.8 F (36.6 C) 97.9 F (36.6 C) 97.8 F (36.6 C)  TempSrc:  Oral Rectal   SpO2: 93%   100%  Weight:  65.8 kg (145 lb)    Height:  5\' 4"  (1.626 m)     Eyes: Anicteric no pallor. ENMT: No discharge from the ears eyes nose or mouth. Neck: No mass felt.  No neck rigidity.  No JVD appreciated. Respiratory: No rhonchi or crepitations. Cardiovascular: S1-S2 heard. Abdomen: Soft nontender bowel sounds present. Musculoskeletal: No edema.  No joint effusion. Skin: No rash.  Skin appears warm. Neurologic: Alert awake oriented to time place and person.  Moves all extremities. Psychiatric: Appears normal.  Normal affect.   Labs on Admission: I have personally reviewed following labs and imaging studies  CBC: Recent Labs  Lab 02/01/17 1231 02/02/17 1826  WBC 9.6 12.9*  NEUTROABS  --  9.0*  HGB 11.2* 12.5*  HCT 33.0* 36.5*  MCV 92.2 92.9  PLT 193 353   Basic Metabolic Panel: Recent Labs  Lab 02/01/17 1231 02/02/17 1826  NA 130* 128*  K 4.3 5.1  CL 97* 96*  CO2 21* 21*  GLUCOSE 111* 107*  BUN 38* 36*  CREATININE 1.89* 1.68*  CALCIUM 8.7* 9.0   GFR: Estimated Creatinine Clearance: 23.5 mL/min (A) (by C-G formula based on SCr of 1.68 mg/dL (H)). Liver Function Tests: Recent Labs  Lab 02/02/17 1826  AST 29  ALT 19  ALKPHOS 383*  BILITOT 0.8  PROT 7.3  ALBUMIN 3.6   No results for input(s): LIPASE, AMYLASE in the last 168 hours. No results for input(s): AMMONIA in the last 168 hours. Coagulation Profile: No results for input(s): INR, PROTIME in the last 168 hours. Cardiac Enzymes: No results for input(s): CKTOTAL, CKMB,  CKMBINDEX, TROPONINI in the last 168 hours. BNP (last 3 results) No results for input(s): PROBNP in the last 8760 hours. HbA1C: No results for input(s): HGBA1C in the last 72 hours. CBG: No results for input(s): GLUCAP in the last 168 hours. Lipid Profile: No results for input(s): CHOL, HDL, LDLCALC, TRIG, CHOLHDL, LDLDIRECT in the last 72 hours. Thyroid Function Tests: No results for input(s): TSH, T4TOTAL, FREET4, T3FREE, THYROIDAB in the last 72 hours. Anemia Panel: No results for input(s): VITAMINB12, FOLATE, FERRITIN, TIBC, IRON, RETICCTPCT in the last 72 hours. Urine analysis:    Component Value Date/Time   COLORURINE YELLOW 02/02/2017 1828   APPEARANCEUR CLOUDY (A) 02/02/2017 1828   LABSPEC 1.011 02/02/2017 1828   PHURINE 5.0 02/02/2017 1828   GLUCOSEU NEGATIVE 02/02/2017 1828   GLUCOSEU NEGATIVE 08/25/2015 1607   HGBUR SMALL (A) 02/02/2017 1828   BILIRUBINUR NEGATIVE 02/02/2017 1828   KETONESUR NEGATIVE 02/02/2017 1828   PROTEINUR 30 (A) 02/02/2017 1828   UROBILINOGEN 0.2 10/08/2016 1338   UROBILINOGEN 0.2 08/25/2015 1607   NITRITE NEGATIVE 02/02/2017 1828   LEUKOCYTESUR LARGE (A) 02/02/2017 1828   Sepsis Labs: @LABRCNTIP (procalcitonin:4,lacticidven:4) )No results found for this or any previous visit (from the past 240 hour(s)).   Radiological Exams on Admission: Dg Chest 2 View  Result Date: 02/01/2017 CLINICAL DATA:  Increasingly short of breath since fall 3 weeks ago. EXAM: CHEST  2 VIEW COMPARISON:  05/15/2015 FINDINGS: The heart remains normal in size. Aorta rib is tortuous. Mediastinum is rotated to the right limiting evaluation of the mediastinum. Lungs are under aerated. Chronic interstitial changes. Calcified granulomata at the right lung base. Stable thoracic spine. Osteopenia. No pneumothorax or pleural effusion. AICD device is unchanged via left subclavian vein. Chronic sternal deformity. IMPRESSION: No active cardiopulmonary disease. Electronically Signed    By: Marybelle Killings M.D.   On: 02/01/2017 11:54   Dg Ribs Unilateral Left  Result Date: 02/01/2017 CLINICAL DATA:  Increasing SOB since fall 3 weeks ago; left lower rib pain x 3 weeks; hx gastric CA, CHF, HTN; ex smoker quit 40 years ago; EXAM: LEFT RIBS - 2 VIEW COMPARISON:  Current chest radiograph. Previous chest radiograph, 05/15/2015. FINDINGS: No rib fracture or rib lesion.  Bony thorax is demineralized. No left lung consolidation. No pleural effusion or evidence of a pneumothorax. IMPRESSION: No rib fracture or rib lesion. Electronically Signed   By: Lajean Manes M.D.   On: 02/01/2017 11:50   Dg Chest Port 1 View  Result Date: 02/02/2017 CLINICAL DATA:  83 year old male with cough and fever. EXAM: PORTABLE CHEST 1 VIEW COMPARISON:  Chest radiograph dated 02/01/2017 FINDINGS: There is shallow inspiration with bibasilar linear atelectasis/ scarring. Stable calcified granuloma at the right lung base. No focal consolidation, pleural effusion, or pneumothorax. The cardiac silhouette is within normal limits. There is atherosclerotic calcification of the aorta. Left pectoral dual lead AICD device. There is osteopenia with degenerative changes of the spine. No acute osseous pathology. IMPRESSION: No active disease. Electronically Signed   By: Anner Crete M.D.   On: 02/02/2017 19:10    EKG: Independently reviewed.  Paced rhythm.  Assessment/Plan Active Problems:   Hypothyroidism   Essential hypertension   Automatic implantable cardioverter-defibrillator in situ   Anemia in chronic renal disease   History of B-cell lymphoma   Chronic combined systolic and diastolic CHF (congestive heart failure) ([facility])   CKD (chronic kidney disease) stage 3, GFR 30-59 ml/min ([facility])   UTI (urinary tract infection)   Liver cirrhosis ([facility])   Hyponatremia    1. Fever and chills likely from persistent UTI -patient remained afebrile in the ER UA was compatible with UTI but appeared weak.  Patient admitted for  further observation as patient also had a recent fall with spine fracture.  Patient started on empiric antibiotics follow cultures.  Get physical therapy consult. 2. Chronic systolic heart failure last EF measured in 2016 was 30-35% status post AICD placement -chest x-ray unremarkable.  We will continue to observe. 3. Difficulty swallowing -after admission patient had some difficulty swallowing.  Will get speech therapist evaluation. 4. Chronic kidney disease stage IV creatinine appears to be at baseline. 5. Chronic anemia probably from renal disease -follow CBC. 6. Hypothyroidism on Synthroid.  Since patient is weak will check TSH. 7. History of B-cell lymphoma in remission being followed by oncologist. 8. History of liver cirrhosis.  No acute issues. 9. Hyponatremia likely from dehydration.  Patient does not look to be in fluid overload at this time.  Gently hydrate and recheck metabolic panel.   DVT prophylaxis: Lovenox. Code Status: Full code. Family Communication: Discussed with patient. Disposition Plan: To be determined. Consults called: Physical therapy. Admission status: Observation.   Rise Patience MD [facility] Pager [phone].  If 7PM-7AM, please contact night-coverage [email or web address] Password [email or web address]  02/02/2017, 10:05 PM

## 2017-02-02 NOTE — Progress Notes (Signed)
Please call Mechele Claude @ [phone] @ [phone]

## 2017-02-02 NOTE — ED Notes (Signed)
ED TO INPATIENT HANDOFF REPORT  Name/Age/Gender [name] [name] 83 y.o. male  Code Status Code Status History    Date Active Date Inactive Code Status Order ID Comments User Context   03/12/2015 01:34 03/16/2015 15:27 Full Code [id number]  Toy Baker, MD Inpatient   10/26/2014 15:15 11/02/2014 16:46 Full Code [id number]  Radene Gunning, NP Inpatient   07/04/2014 12:51 07/05/2014 03:42 Full Code [id number]  Luanne Bras, MD HOV   06/17/2014 03:03 06/20/2014 18:28 Full Code [id number]  Etta Quill, DO ED   12/29/2011 16:10 01/02/2012 15:03 Full Code [id number]  Valda Lamb, RN Inpatient      Home/SNF/Other Skilled nursing facility  Chief Complaint chills  Level of Care/Admitting Diagnosis ED Disposition    ED Disposition Condition [facility] Area: [facility] [[id number]]  Level of Care: Telemetry [5]  Admit to tele based on following criteria: Monitor for Ischemic changes  Diagnosis: UTI (urinary tract infection) [524818]  Admitting Physician: Rise Patience [phone]  Attending Physician: Rise Patience [email or web address]  PT Class (Do Not Modify): Observation [104]  PT Acc Code (Do Not Modify): Observation [10022]       Medical History Past Medical History:  Diagnosis Date  . Cancer ([place])    Stomach  . CHF (congestive heart failure) ([place])   . Collagen vascular disease ([place])   . Hypertension     Allergies Allergies  Allergen Reactions  . Aleve [Naproxen Sodium] Other (See Comments)    Bleeding ulcers  . Ibuprofen Other (See Comments)    Bleeding ulcers  . Tramadol     Hallucinations     IV Location/Drains/Wounds Patient Lines/Drains/Airways Status   Active Line/Drains/Airways    Name:   Placement date:   Placement time:   Site:   Days:   Peripheral IV 02/01/17 Left Forearm   02/01/17    1238    Forearm   1   Incision (Closed) 08/22/15 Back Lower   08/22/15    1029     530   Pressure Injury 02/01/17 Stage II -   Partial thickness loss of dermis presenting as a shallow open ulcer with a red, pink wound bed without slough.   02/01/17    1337     1          Labs/Imaging Results for orders placed or performed during the hospital encounter of 02/02/17 (from the past 48 hour(s))  CBC with Differential/Platelet     Status: Abnormal   Collection Time: 02/02/17  6:26 PM  Result Value Ref Range   WBC 12.9 (H) 4.0 - 10.5 K/uL   RBC 3.93 (L) 4.22 - 5.81 MIL/uL   Hemoglobin 12.5 (L) 13.0 - 17.0 g/dL   HCT 36.5 (L) 39.0 - 52.0 %   MCV 92.9 78.0 - 100.0 fL   MCH 31.8 26.0 - 34.0 pg   MCHC 34.2 30.0 - 36.0 g/dL   RDW 15.3 11.5 - 15.5 %   Platelets 197 150 - 400 K/uL   Neutrophils Relative % 69 %   Neutro Abs 9.0 (H) 1.7 - 7.7 K/uL   Lymphocytes Relative 11 %   Lymphs Abs 1.4 0.7 - 4.0 K/uL   Monocytes Relative 16 %   Monocytes Absolute 2.0 (H) 0.1 - 1.0 K/uL   Eosinophils Relative 4 %   Eosinophils Absolute 0.5 0.0 - 0.7 K/uL   Basophils Relative 0 %   Basophils Absolute 0.0 0.0 - 0.1 K/uL  Comprehensive metabolic  panel     Status: Abnormal   Collection Time: 02/02/17  6:26 PM  Result Value Ref Range   Sodium 128 (L) 135 - 145 mmol/L   Potassium 5.1 3.5 - 5.1 mmol/L   Chloride 96 (L) 101 - 111 mmol/L   CO2 21 (L) 22 - 32 mmol/L   Glucose, Bld 107 (H) 65 - 99 mg/dL   BUN 36 (H) 6 - 20 mg/dL   Creatinine, Ser 1.68 (H) 0.61 - 1.24 mg/dL   Calcium 9.0 8.9 - 10.3 mg/dL   Total Protein 7.3 6.5 - 8.1 g/dL   Albumin 3.6 3.5 - 5.0 g/dL   AST 29 15 - 41 U/L   ALT 19 17 - 63 U/L   Alkaline Phosphatase 383 (H) 38 - 126 U/L   Total Bilirubin 0.8 0.3 - 1.2 mg/dL   GFR calc non Af Amer 34 (L) >60 mL/min   GFR calc Af Amer 39 (L) >60 mL/min    Comment: (NOTE) The eGFR has been calculated using the CKD EPI equation. This calculation has not been validated in all clinical situations. eGFR's persistently <60 mL/min signify possible Chronic Kidney Disease.    Anion gap 11 5 - 15  Urinalysis, Routine w  reflex microscopic     Status: Abnormal   Collection Time: 02/02/17  6:28 PM  Result Value Ref Range   Color, Urine YELLOW YELLOW   APPearance CLOUDY (A) CLEAR   Specific Gravity, Urine 1.011 1.005 - 1.030   pH 5.0 5.0 - 8.0   Glucose, UA NEGATIVE NEGATIVE mg/dL   Hgb urine dipstick SMALL (A) NEGATIVE   Bilirubin Urine NEGATIVE NEGATIVE   Ketones, ur NEGATIVE NEGATIVE mg/dL   Protein, ur 30 (A) NEGATIVE mg/dL   Nitrite NEGATIVE NEGATIVE   Leukocytes, UA LARGE (A) NEGATIVE   RBC / HPF 0-5 0 - 5 RBC/hpf   WBC, UA TOO NUMEROUS TO COUNT 0 - 5 WBC/hpf   Bacteria, UA NONE SEEN NONE SEEN   Squamous Epithelial / LPF NONE SEEN NONE SEEN   WBC Clumps PRESENT    Mucus PRESENT   I-Stat CG4 Lactic Acid, ED     Status: None   Collection Time: 02/02/17  7:34 PM  Result Value Ref Range   Lactic Acid, Venous 0.94 0.5 - 1.9 mmol/L   Dg Chest 2 View  Result Date: 02/01/2017 CLINICAL DATA:  Increasingly short of breath since fall 3 weeks ago. EXAM: CHEST  2 VIEW COMPARISON:  05/15/2015 FINDINGS: The heart remains normal in size. Aorta rib is tortuous. Mediastinum is rotated to the right limiting evaluation of the mediastinum. Lungs are under aerated. Chronic interstitial changes. Calcified granulomata at the right lung base. Stable thoracic spine. Osteopenia. No pneumothorax or pleural effusion. AICD device is unchanged via left subclavian vein. Chronic sternal deformity. IMPRESSION: No active cardiopulmonary disease. Electronically Signed   By: Marybelle Killings M.D.   On: 02/01/2017 11:54   Dg Ribs Unilateral Left  Result Date: 02/01/2017 CLINICAL DATA:  Increasing SOB since fall 3 weeks ago; left lower rib pain x 3 weeks; hx gastric CA, CHF, HTN; ex smoker quit 40 years ago; EXAM: LEFT RIBS - 2 VIEW COMPARISON:  Current chest radiograph. Previous chest radiograph, 05/15/2015. FINDINGS: No rib fracture or rib lesion.  Bony thorax is demineralized. No left lung consolidation. No pleural effusion or evidence  of a pneumothorax. IMPRESSION: No rib fracture or rib lesion. Electronically Signed   By: Lajean Manes M.D.   On: 02/01/2017 11:50  Dg Chest Port 1 View  Result Date: 02/02/2017 CLINICAL DATA:  83 year old male with cough and fever. EXAM: PORTABLE CHEST 1 VIEW COMPARISON:  Chest radiograph dated 02/01/2017 FINDINGS: There is shallow inspiration with bibasilar linear atelectasis/ scarring. Stable calcified granuloma at the right lung base. No focal consolidation, pleural effusion, or pneumothorax. The cardiac silhouette is within normal limits. There is atherosclerotic calcification of the aorta. Left pectoral dual lead AICD device. There is osteopenia with degenerative changes of the spine. No acute osseous pathology. IMPRESSION: No active disease. Electronically Signed   By: Anner Crete M.D.   On: 02/02/2017 19:10    Pending Labs Unresulted Labs (From admission, onward)   Start     Ordered   02/02/17 1828  Urine culture  STAT,   STAT     02/02/17 1827   02/02/17 1826  Blood culture (routine x 2)  BLOOD CULTURE X 2,   STAT     02/02/17 1825      Vitals/Pain Today's Vitals   02/02/17 1825 02/02/17 1832 02/02/17 2025 02/02/17 2049  BP:    (!) 159/68  Pulse:    (!) 101  Resp:    20  Temp: 97.8 F (36.6 C) 97.9 F (36.6 C)  97.8 F (36.6 C)  TempSrc: Oral Rectal    SpO2:    100%  Weight: 145 lb (65.8 kg)     Height: '5\' 4"'  (1.626 m)     PainSc:   4      Isolation Precautions No active isolations  Medications Medications  sodium chloride 0.9 % bolus 1,000 mL (0 mLs Intravenous Stopped 02/02/17 2117)  HYDROcodone-acetaminophen (NORCO/VICODIN) 5-325 MG per tablet 1 tablet (1 tablet Oral Given 02/02/17 1936)  cefTRIAXone (ROCEPHIN) 1 g in dextrose 5 % 50 mL IVPB (0 g Intravenous Stopped 02/02/17 2117)    Mobility walks with person assist

## 2017-02-03 ENCOUNTER — Observation Stay (HOSPITAL_COMMUNITY): Payer: Medicare Other

## 2017-02-03 ENCOUNTER — Other Ambulatory Visit: Payer: Self-pay

## 2017-02-03 DIAGNOSIS — I959 Hypotension, unspecified: Secondary | ICD-10-CM | POA: Diagnosis not present

## 2017-02-03 DIAGNOSIS — N183 Chronic kidney disease, stage 3 (moderate): Secondary | ICD-10-CM

## 2017-02-03 DIAGNOSIS — Z8572 Personal history of non-Hodgkin lymphomas: Secondary | ICD-10-CM | POA: Diagnosis not present

## 2017-02-03 DIAGNOSIS — N401 Enlarged prostate with lower urinary tract symptoms: Secondary | ICD-10-CM | POA: Diagnosis present

## 2017-02-03 DIAGNOSIS — J189 Pneumonia, unspecified organism: Secondary | ICD-10-CM | POA: Diagnosis not present

## 2017-02-03 DIAGNOSIS — E785 Hyperlipidemia, unspecified: Secondary | ICD-10-CM | POA: Diagnosis present

## 2017-02-03 DIAGNOSIS — J440 Chronic obstructive pulmonary disease with acute lower respiratory infection: Secondary | ICD-10-CM | POA: Diagnosis not present

## 2017-02-03 DIAGNOSIS — R1310 Dysphagia, unspecified: Secondary | ICD-10-CM | POA: Diagnosis not present

## 2017-02-03 DIAGNOSIS — K7460 Unspecified cirrhosis of liver: Secondary | ICD-10-CM | POA: Diagnosis not present

## 2017-02-03 DIAGNOSIS — R933 Abnormal findings on diagnostic imaging of other parts of digestive tract: Secondary | ICD-10-CM

## 2017-02-03 DIAGNOSIS — C8519 Unspecified B-cell lymphoma, extranodal and solid organ sites: Secondary | ICD-10-CM | POA: Diagnosis not present

## 2017-02-03 DIAGNOSIS — Q395 Congenital dilatation of esophagus: Secondary | ICD-10-CM | POA: Diagnosis not present

## 2017-02-03 DIAGNOSIS — I5042 Chronic combined systolic (congestive) and diastolic (congestive) heart failure: Secondary | ICD-10-CM | POA: Diagnosis not present

## 2017-02-03 DIAGNOSIS — K219 Gastro-esophageal reflux disease without esophagitis: Secondary | ICD-10-CM | POA: Diagnosis present

## 2017-02-03 DIAGNOSIS — E034 Atrophy of thyroid (acquired): Secondary | ICD-10-CM | POA: Diagnosis not present

## 2017-02-03 DIAGNOSIS — D631 Anemia in chronic kidney disease: Secondary | ICD-10-CM | POA: Diagnosis not present

## 2017-02-03 DIAGNOSIS — K229 Disease of esophagus, unspecified: Secondary | ICD-10-CM | POA: Diagnosis not present

## 2017-02-03 DIAGNOSIS — R338 Other retention of urine: Secondary | ICD-10-CM | POA: Diagnosis not present

## 2017-02-03 DIAGNOSIS — M25512 Pain in left shoulder: Secondary | ICD-10-CM | POA: Diagnosis not present

## 2017-02-03 DIAGNOSIS — N184 Chronic kidney disease, stage 4 (severe): Secondary | ICD-10-CM

## 2017-02-03 DIAGNOSIS — Q399 Congenital malformation of esophagus, unspecified: Secondary | ICD-10-CM | POA: Diagnosis not present

## 2017-02-03 DIAGNOSIS — K228 Other specified diseases of esophagus: Secondary | ICD-10-CM | POA: Diagnosis not present

## 2017-02-03 DIAGNOSIS — N3000 Acute cystitis without hematuria: Secondary | ICD-10-CM | POA: Diagnosis not present

## 2017-02-03 DIAGNOSIS — K224 Dyskinesia of esophagus: Secondary | ICD-10-CM | POA: Diagnosis not present

## 2017-02-03 DIAGNOSIS — E860 Dehydration: Secondary | ICD-10-CM | POA: Diagnosis present

## 2017-02-03 DIAGNOSIS — I10 Essential (primary) hypertension: Secondary | ICD-10-CM | POA: Diagnosis not present

## 2017-02-03 DIAGNOSIS — I5043 Acute on chronic combined systolic (congestive) and diastolic (congestive) heart failure: Secondary | ICD-10-CM | POA: Diagnosis not present

## 2017-02-03 DIAGNOSIS — E871 Hypo-osmolality and hyponatremia: Secondary | ICD-10-CM | POA: Diagnosis not present

## 2017-02-03 DIAGNOSIS — J069 Acute upper respiratory infection, unspecified: Secondary | ICD-10-CM | POA: Diagnosis present

## 2017-02-03 DIAGNOSIS — R0602 Shortness of breath: Secondary | ICD-10-CM | POA: Diagnosis not present

## 2017-02-03 DIAGNOSIS — N390 Urinary tract infection, site not specified: Secondary | ICD-10-CM | POA: Diagnosis not present

## 2017-02-03 DIAGNOSIS — E039 Hypothyroidism, unspecified: Secondary | ICD-10-CM | POA: Diagnosis not present

## 2017-02-03 DIAGNOSIS — R509 Fever, unspecified: Secondary | ICD-10-CM | POA: Diagnosis not present

## 2017-02-03 DIAGNOSIS — Q613 Polycystic kidney, unspecified: Secondary | ICD-10-CM | POA: Diagnosis not present

## 2017-02-03 DIAGNOSIS — R634 Abnormal weight loss: Secondary | ICD-10-CM | POA: Diagnosis not present

## 2017-02-03 DIAGNOSIS — W19XXXS Unspecified fall, sequela: Secondary | ICD-10-CM | POA: Diagnosis present

## 2017-02-03 DIAGNOSIS — Z95810 Presence of automatic (implantable) cardiac defibrillator: Secondary | ICD-10-CM | POA: Diagnosis not present

## 2017-02-03 DIAGNOSIS — I130 Hypertensive heart and chronic kidney disease with heart failure and stage 1 through stage 4 chronic kidney disease, or unspecified chronic kidney disease: Secondary | ICD-10-CM | POA: Diagnosis not present

## 2017-02-03 DIAGNOSIS — K5900 Constipation, unspecified: Secondary | ICD-10-CM | POA: Diagnosis present

## 2017-02-03 DIAGNOSIS — J9811 Atelectasis: Secondary | ICD-10-CM | POA: Diagnosis not present

## 2017-02-03 DIAGNOSIS — F0390 Unspecified dementia without behavioral disturbance: Secondary | ICD-10-CM | POA: Diagnosis present

## 2017-02-03 LAB — OSMOLALITY: Osmolality: 279 mOsm/kg (ref 275–295)

## 2017-02-03 LAB — CBC
HCT: 31.7 % — ABNORMAL LOW (ref 39.0–52.0)
HEMOGLOBIN: 10.7 g/dL — AB (ref 13.0–17.0)
MCH: 31.6 pg (ref 26.0–34.0)
MCHC: 33.8 g/dL (ref 30.0–36.0)
MCV: 93.5 fL (ref 78.0–100.0)
PLATELETS: 170 10*3/uL (ref 150–400)
RBC: 3.39 MIL/uL — ABNORMAL LOW (ref 4.22–5.81)
RDW: 15.2 % (ref 11.5–15.5)
WBC: 10.3 10*3/uL (ref 4.0–10.5)

## 2017-02-03 LAB — GLUCOSE, CAPILLARY
Glucose-Capillary: 84 mg/dL (ref 65–99)
Glucose-Capillary: 80 mg/dL (ref 65–99)
Glucose-Capillary: 108 mg/dL — ABNORMAL HIGH (ref 65–99)
Glucose-Capillary: 103 mg/dL — ABNORMAL HIGH (ref 65–99)

## 2017-02-03 LAB — EXPECTORATED SPUTUM ASSESSMENT W REFEX TO RESP CULTURE

## 2017-02-03 LAB — BASIC METABOLIC PANEL
ANION GAP: 6 (ref 5–15)
BUN: 31 mg/dL — AB (ref 6–20)
CALCIUM: 8.1 mg/dL — AB (ref 8.9–10.3)
CO2: 20 mmol/L — AB (ref 22–32)
CREATININE: 1.48 mg/dL — AB (ref 0.61–1.24)
Chloride: 104 mmol/L (ref 101–111)
GFR calc Af Amer: 46 mL/min — ABNORMAL LOW (ref >60)
GFR, EST NON AFRICAN AMERICAN: 39 mL/min — AB (ref >60)
GLUCOSE: 95 mg/dL (ref 65–99)
Potassium: 4.4 mmol/L (ref 3.5–5.1)
Sodium: 130 mmol/L — ABNORMAL LOW (ref 135–145)

## 2017-02-03 LAB — CK: CK TOTAL: 52 U/L (ref 49–397)

## 2017-02-03 LAB — MRSA PCR SCREENING: MRSA by PCR: NEGATIVE

## 2017-02-03 LAB — TROPONIN I: Troponin I: <0.03 ng/mL (ref <0.03)

## 2017-02-03 LAB — TSH: TSH: 4.962 u[IU]/mL — ABNORMAL HIGH (ref 0.350–4.500)

## 2017-02-03 LAB — SODIUM, URINE, RANDOM: SODIUM UR: 82 mmol/L

## 2017-02-03 LAB — OSMOLALITY, URINE: OSMOLALITY UR: 352 mosm/kg (ref 300–900)

## 2017-02-03 LAB — EXPECTORATED SPUTUM ASSESSMENT W GRAM STAIN, RFLX TO RESP C

## 2017-02-03 LAB — LACTIC ACID, PLASMA: Lactic Acid, Venous: 0.9 mmol/L (ref 0.5–1.9)

## 2017-02-03 LAB — INFLUENZA PANEL BY PCR (TYPE A & B)
INFLBPCR: NEGATIVE
Influenza A By PCR: NEGATIVE

## 2017-02-03 MED ORDER — NIFEDIPINE 10 MG PO CAPS
10.0000 mg | ORAL_CAPSULE | Freq: Three times a day (TID) | ORAL | Status: DC
  Administered 2017-02-03 – 2017-02-09 (×12): 10 mg via ORAL
  Filled 2017-02-03 (×19): qty 1

## 2017-02-03 MED ORDER — FENTANYL CITRATE (PF) 100 MCG/2ML IJ SOLN
50.0000 ug | INTRAMUSCULAR | Status: DC | PRN
  Administered 2017-02-03 – 2017-02-07 (×5): 50 ug via INTRAVENOUS
  Filled 2017-02-03 (×5): qty 2

## 2017-02-03 MED ORDER — NITROGLYCERIN 0.4 MG/SPRAY TL SOLN
1.0000 | Freq: Three times a day (TID) | Status: DC
  Administered 2017-02-03 – 2017-02-09 (×11): 1 via SUBLINGUAL
  Filled 2017-02-03: qty 12

## 2017-02-03 MED ORDER — SODIUM CHLORIDE 0.9 % IV BOLUS (SEPSIS)
250.0000 mL | Freq: Once | INTRAVENOUS | Status: AC
  Administered 2017-02-03: 250 mL via INTRAVENOUS

## 2017-02-03 MED ORDER — ISOSORBIDE DINITRATE 20 MG PO TABS
20.0000 mg | ORAL_TABLET | Freq: Three times a day (TID) | ORAL | Status: DC
  Administered 2017-02-03 (×2): 20 mg via ORAL
  Filled 2017-02-03 (×3): qty 1

## 2017-02-03 MED ORDER — FUROSEMIDE 10 MG/ML IJ SOLN
20.0000 mg | Freq: Once | INTRAMUSCULAR | Status: AC
  Administered 2017-02-03: 20 mg via INTRAVENOUS
  Filled 2017-02-03: qty 2

## 2017-02-03 MED ORDER — ACETYLCYSTEINE 20 % IN SOLN
4.0000 mL | Freq: Two times a day (BID) | RESPIRATORY_TRACT | Status: DC
  Administered 2017-02-03 – 2017-02-04 (×2): 4 mL via RESPIRATORY_TRACT
  Filled 2017-02-03 (×2): qty 4

## 2017-02-03 MED ORDER — ISOSORBIDE DINITRATE 10 MG PO TABS: 10.0000 mg | ORAL_TABLET | Freq: Three times a day (TID) | ORAL | Status: DC

## 2017-02-03 MED ORDER — DEXTROSE 5 % IV SOLN
1.0000 g | INTRAVENOUS | Status: DC
  Administered 2017-02-03 – 2017-02-05 (×3): 1 g via INTRAVENOUS
  Filled 2017-02-03 (×4): qty 10

## 2017-02-03 MED ORDER — ACETYLCYSTEINE 10 % IN SOLN
4.0000 mL | Freq: Two times a day (BID) | RESPIRATORY_TRACT | Status: DC
  Administered 2017-02-03: 4 mL via RESPIRATORY_TRACT
  Filled 2017-02-03 (×2): qty 4

## 2017-02-03 MED ORDER — HYDRALAZINE HCL 20 MG/ML IJ SOLN
10.0000 mg | Freq: Four times a day (QID) | INTRAMUSCULAR | Status: DC | PRN
  Administered 2017-02-05: 10 mg via INTRAVENOUS
  Filled 2017-02-03: qty 1

## 2017-02-03 MED ORDER — IPRATROPIUM-ALBUTEROL 0.5-2.5 (3) MG/3ML IN SOLN
3.0000 mL | Freq: Four times a day (QID) | RESPIRATORY_TRACT | Status: DC
  Administered 2017-02-03: 3 mL via RESPIRATORY_TRACT
  Filled 2017-02-03 (×2): qty 3

## 2017-02-03 MED ORDER — IPRATROPIUM-ALBUTEROL 0.5-2.5 (3) MG/3ML IN SOLN
3.0000 mL | Freq: Two times a day (BID) | RESPIRATORY_TRACT | Status: DC
  Administered 2017-02-03 – 2017-02-09 (×12): 3 mL via RESPIRATORY_TRACT
  Filled 2017-02-03 (×12): qty 3

## 2017-02-03 MED ORDER — AZITHROMYCIN 500 MG IV SOLR
500.0000 mg | INTRAVENOUS | Status: DC
  Administered 2017-02-03 – 2017-02-06 (×4): 500 mg via INTRAVENOUS
  Filled 2017-02-03 (×4): qty 500

## 2017-02-03 MED ORDER — HYDROCODONE-ACETAMINOPHEN 5-325 MG PO TABS
1.0000 | ORAL_TABLET | Freq: Once | ORAL | Status: AC
  Administered 2017-02-03: 1 via ORAL
  Filled 2017-02-03: qty 1

## 2017-02-03 NOTE — Progress Notes (Signed)
PHARMACY NOTE -  Antibiotic Consult  Pharmacy has been assisting with dosing of Ceftriaxone for UTI. Dosage remains stable at Ceftriaxone 1g IV q24 (next dose due on 11/12 at 2000) and need for further dosage adjustment appears unlikely at present.    Will sign off at this time.  Please reconsult if a change in clinical status warrants re-evaluation of dosage.   Gretta Arab PharmD, BCPS Pager [phone] 02/03/2017 10:57 AM

## 2017-02-03 NOTE — Telephone Encounter (Signed)
O2 referral created

## 2017-02-03 NOTE — Progress Notes (Signed)
Pt re-instructed in use of flutter device.  Pt demonstrated good understanding and technique.

## 2017-02-03 NOTE — Progress Notes (Addendum)
Patient stated that he had pain under his left shoulder and radiating to his back. Vitals: 97.36F;HR59;RR20;148/58;99% 2 L/Min Nasal Canula. A 12 lead EKG was performed.The EKG is on the chart.

## 2017-02-03 NOTE — Progress Notes (Signed)
Patient stated that he sometimes had difficulty swallowing. PCP was notified for an order for SLP.

## 2017-02-03 NOTE — Progress Notes (Signed)
Modified Barium Swallow Progress Note  Patient Details  Name: [name] [name] MRN: [id number] Date of Birth: [birth year]  Today's Date: 02/03/2017  Modified Barium Swallow completed.  Full report located under Chart Review in the Imaging Section.  Brief recommendations include the following:  Clinical Impression  Pt presents with functional oral swallow with pharyngocervical esophageal *suspect primary esophageal* deficits.  He was only tested with a few liquid boluses due to concerns for primary esophageal deficits and need for esophagram.  Premature spillage of boluses into pharynx observed - with swallow triggering at pyriform sinus with thin liquid via tsp.  Trace vallecular/pyriform sinus residual noted with nectar which pt independently dry swallows to clear.  Trace laryngeal penetration of thin before swallow due to decreased timing of laryngeal closure.  Pt did not aspirate but he does demonstrate backflow of liquid barium into pharynx/open larynx.  Upon esophageal sweep, his esophagus appeared full - ? spasms. Trace laryngeal penetration of thin/nectar backflowed material observed with pt conducting further swallows to help clear.  Suspect he is having some aspiration primarily due to his esophageal deficits.  Esophagram to follow, will follow up.     Swallow Evaluation Recommendations   Recommended Consults: Consider esophageal assessment(pt for esophagram immediately after MBS)   SLP Diet Recommendations: NPO;Ice chips PRN after oral care(pending esophagram)                              Luanna Salk, MS [facility] SLP [phone]   Macario Golds 02/03/2017,2:51 PM

## 2017-02-03 NOTE — Evaluation (Signed)
Clinical/Bedside Swallow Evaluation Patient Details  Name: [name] [name] MRN: [id number] Date of Birth: 10-Feb-1923  Today's Date: 02/03/2017 Time: SLP Start Time (ACUTE ONLY): 1210 SLP Stop Time (ACUTE ONLY): 1250 SLP Time Calculation (min) (ACUTE ONLY): 40 min  Past Medical History:  Past Medical History:  Diagnosis Date  . Cancer ([place])    Stomach  . CHF (congestive heart failure) ([place])   . Collagen vascular disease ([place])   . Hypertension    Past Surgical History:  Past Surgical History:  Procedure Laterality Date  . APPENDECTOMY    . COLONOSCOPY  12/1998, 02/2004   diverticulosois, external hemorrhoids  . CRANIOTOMY Left   . ESOPHAGOGASTRODUODENOSCOPY  06/17/2014   Dr. Oneida Alar: 1. Patent stricture at the gastroesophageal junction 2. UGIB Due to multiple  gastric ulcers 3. Moderate Duodentitis. Negative H.pylori  . heart disease     permanent pacemaker  . HIP FRACTURE SURGERY  2011   ORIF  R.  . INGUINAL HERNIA REPAIR    . IR GENERIC HISTORICAL  10/26/2015   IR RADIOLOGIST EVAL & MGMT 10/26/2015 [facility] RAD  . PACEMAKER INSERTION     HPI:  [name] [name] is a 83 yo male adm to [facility] with UTI/fever.  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  He does report some difficulties after his stomach surgery in March 2016 to treat his cancer.  CXR negative.  [name] [name] admits to progressive problems with swallowing noted recently but denies sudden onset.  He denies ever having swallow evaluation completed previously.     Assessment / Plan / Recommendation Clinical Impression  [name] [name] presents with concerns for multifactorial dysphagia = pharyngoesophageal and esophageal with concerns for overt aspiration.  [name] [name] reports he has concerns he may be aspirating with food more than drink.  Aspiration of secretions suspected at this time characterized by wet gurgly voice and expectoration of visous white  secretions.   [name] [name] also consumed applesauce - 4 ounces - followed by delayed coughing and expectoration.    Recommend MBS and esophagram to allow multiple area evaluation.  Note h/o "patent GE stricture" diagnosed from endoscopy 05/2014 .  Spoke to MD and [name] [name] - both agreeable to plan.   SLP Visit Diagnosis: Dysphagia, pharyngoesophageal phase (R13.14)    Aspiration Risk  Severe aspiration risk;Risk for inadequate nutrition/hydration    Diet Recommendation NPO   Supervision: Patient able to self feed    Other  Recommendations     Follow up Recommendations        Frequency and Duration            Prognosis Prognosis for Safe Diet Advancement: Guarded      Swallow Study   General Date of Onset: 02/03/17 HPI: [name] [name] is a 83 yo male adm to [facility] with UTI/fever.  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  He does report some difficulties after his stomach surgery in March 2016 to treat his cancer.  CXR negative.  [name] [name] admits to progressive problems with swallowing noted recently but denies sudden onset.  He denies ever having swallow evaluation completed previously.   Type of Study: Bedside Swallow Evaluation Previous Swallow Assessment: none Respiratory Status: Nasal cannula(2 liters) History of Recent Intubation: No Behavior/Cognition: Alert;Cooperative;Pleasant mood;Other (Comment)(HOH) Oral Cavity Assessment: Erythema;Excessive secretions Oral Care Completed by SLP: No Oral Cavity - Dentition: Adequate natural dentition  Self-Feeding Abilities: Able to feed self Patient Positioning: Upright in chair Baseline Vocal Quality: Low vocal intensity Volitional Cough: Strong(productive to viscous secretions) Volitional Swallow: Able to elicit    Oral/Motor/Sensory Function Overall Oral Motor/Sensory Function: Within functional limits   Ice Chips Ice chips: Impaired Presentation:  Spoon Pharyngeal Phase Impairments: Multiple swallows;Wet Vocal Quality;Throat Clearing - Immediate;Cough - Delayed   Thin Liquid Thin Liquid: Impaired Pharyngeal  Phase Impairments: Multiple swallows;Cough - Immediate;Cough - Delayed    Nectar Thick Nectar Thick Liquid: Not tested   Honey Thick Honey Thick Liquid: Not tested   Puree Puree: Impaired Presentation: Self Fed;Spoon Pharyngeal Phase Impairments: Multiple swallows;Throat Clearing - Immediate;Other (comments);Cough - Delayed Other Comments: delayed expectoration of applesauce mixed with secretions   Solid   GO   Solid: Not tested    Functional Assessment Tool Used: clinical judgement Functional Limitations: Swallowing Swallow Current Status ([id number]): At least 80 percent but less than 100 percent impaired, limited or restricted Swallow Goal Status [phone]): At least 60 percent but less than 80 percent impaired, limited or restricted   Macario Golds 02/03/2017,1:01 PM   Luanna Salk, [facility] SLP [phone]

## 2017-02-03 NOTE — Progress Notes (Signed)
The patient has had flatus now. Pt. Stated he felt a little better.  PCP gave new orders.

## 2017-02-03 NOTE — Care Management Note (Signed)
Case Management Note  Patient Details  Name: MADDIX HEINZ MRN: [id number] Date of Birth: [birth year]  Subjective/Objective: 83 y/o m admitted w/UTI. From [facility]. PT cons.[facility] following for HHPT/HHRN. Also has private duty care-[employer] [facility] [employer].                   Action/Plan:d/c plan return back to indep [employer].   Expected Discharge Date:                  Expected Discharge Plan:  Home/Self Care  In-House Referral:     Discharge planning Services  CM Consult  Post Acute Care Choice:    Choice offered to:     DME Arranged:    DME Agency:     HH Arranged:    HH Agency:     Status of Service:  In process, will continue to follow  If discussed at Long Length of Stay Meetings, dates discussed:    Additional Comments:  Dessa Phi, RN 02/03/2017, 3:05 PM

## 2017-02-03 NOTE — Consult Note (Signed)
Referring Provider: [facility]   Primary Care Physician:  Cassandria Anger, MD Primary Gastroenterologist:  Barney Drain, MD  Reason for Consultation:  Abnormal esophogram  ASSESSMENT AND PLAN:    53. 83 yo male admitted with chills / SOB.  Regurgitating copious amounts of phlegm and some undigested pieces of food over last several months.  Barium swallow demonstrates intermittent spasms of the esophagus with severe distal esophageal spasms and repeated reflux into the oropharynx.  Despite findings patient doesn't really give a hx of dysphagia. No chest pain which sometimes accompanies esophageal spasms. He has had some mild weight loss. MBSS suggests primary esophageal problem.  -Will most likely proceed with EGD to be done on Tuesday morning with Dr. Henrene Pastor. Given advanced age and medical problems he is at increased risk for procedures, including aspiration if esophagus full of debris.  NPO after midnight. Will check on him in am to make final arrangements for procedure.   2. Cirrhosis, first identified in 2016 on imaging study. Followed by Dr. Oneida Alar though doesn't look like he has seen her since 2016.  Workup for etiology was negative except for a strongly positive AMA. Alk phos chronically elevated but over the last 1.5 years it has continued to trend up from 72 to 380.  Based on outpatient medication list he doesn't appear to be on Urso for cholestasis .   -No evidence for hepatic decompensation at present.   3. 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 No recurrence on last EGD in 2016.   4. Recent non-displaced C1 ring fx following a fall. Being managed conservatively. Requiring narcotics  5. Hx of CHF, ICD placement, CKD / polycystic kidneys  6. Mild dementia, on Aricept  HPI: JULIOUS LANGLOIS is a 83 y.o. male with a hx of systolic CHF with EF of 35-59%. , ICD placement, CKD,  cirrhosis and gastric lymphoma. who presented to ED 2 days ago with SOB.  Additionally he has had some recent falls and has a cervical spine fx being managed conservatively. Dxed with UTI, sent home on abx. Yesterday patient brought back to ED with chills and SOB. Now on Rocephin for UTI.  He has a productive cough, azithromycin was added for respiratory sx. CXR unremarkable. Not hypoxic   Because of coughing with meals an esophagram was ordered and revealed extensive spasm throughout the esophagus with severe spasm in the distal esophagus.  To and fro peristalsis in the upper esophagus with reflux into the oropharynx repeatedly. Patient denies dysphagia or chest pain. He swallows liquids, pills fine. Over the last year he has begun regurgitating large amounts of phlegm, sometimes containing food.   He complains of back pain since a fall. He really has no other complaints.    Past Medical History:  Diagnosis Date  . Cancer ([place])    Stomach  . CHF (congestive heart failure) ([place])   . Collagen vascular disease ([place])   . Hypertension     Past Surgical History:  Procedure Laterality Date  . APPENDECTOMY    . COLONOSCOPY  12/1998, 02/2004   diverticulosois, external hemorrhoids  . CRANIOTOMY Left   . ESOPHAGOGASTRODUODENOSCOPY  06/17/2014   Dr. Oneida Alar: 1. Patent stricture at the gastroesophageal junction 2. UGIB Due to multiple  gastric ulcers 3. Moderate Duodentitis. Negative H.pylori  . heart disease     permanent pacemaker  . HIP FRACTURE SURGERY  2011   ORIF  R.  . INGUINAL HERNIA REPAIR    .  IR GENERIC HISTORICAL  10/26/2015   IR RADIOLOGIST EVAL & MGMT 10/26/2015 [facility]  . PACEMAKER INSERTION      Prior to Admission medications   Medication Sig Start Date End Date Taking? Authorizing Provider  albuterol (PROVENTIL) (2.5 MG/3ML) 0.083% nebulizer solution USE ONE VIAL VIA NEBULIZER FOUR TIMES DAILY AS NEEDED. 11/22/16  Yes Plotnikov, Evie Lacks, MD  atorvastatin (LIPITOR) 20 MG tablet TAKE 1/2 TABLET BY MOUTH TWICE DAILY 01/02/17  Yes Plotnikov,  Evie Lacks, MD  calcium carbonate (OSCAL) 1500 (600 Ca) MG TABS tablet Take 600 mg of elemental calcium 2 (two) times daily with a meal by mouth.   Yes [provider]  cephALEXin (KEFLEX) 500 MG capsule Take 1 capsule (500 mg total) 3 (three) times daily by mouth. 02/01/17  Yes Jola Schmidt, MD  Cholecalciferol (VITAMIN D) 2000 units CAPS Take 2,000 Units daily by mouth.   Yes [provider]  diltiazem 2 % GEL Apply 1 application topically 3 (three) times daily. 06/12/16  Yes Plotnikov, Evie Lacks, MD  diphenoxylate-atropine (LOMOTIL) 2.5-0.025 MG tablet Take 1 tablet by mouth 4 (four) times daily as needed for diarrhea or loose stools. 06/07/15  Yes Plotnikov, Evie Lacks, MD  docusate sodium (COLACE) 100 MG capsule Take 100 mg daily as needed by mouth for mild constipation.   Yes [provider]  HYDROcodone-acetaminophen (NORCO/VICODIN) 5-325 MG tablet Take 1 tablet every 4 (four) hours as needed by mouth for moderate pain. 02/01/17  Yes Jola Schmidt, MD  levothyroxine (SYNTHROID, LEVOTHROID) 112 MCG tablet TAKE 1 TABLET BY MOUTH DAILY 12/12/16  Yes Plotnikov, Evie Lacks, MD  multivitamin-iron-minerals-folic acid (CENTRUM) chewable tablet Chew 1 tablet by mouth daily.   Yes [provider]  pantoprazole (PROTONIX) 40 MG tablet Take 1 tablet (40 mg total) by mouth daily. 11/05/16  Yes Plotnikov, Evie Lacks, MD  tamsulosin (FLOMAX) 0.4 MG CAPS capsule Take 0.4 mg every evening by mouth. 01/02/17  Yes [provider]  triamcinolone (NASACORT AQ) 55 MCG/ACT AERO nasal inhaler Place 2 sprays into the nose daily. 12/20/15  Yes Plotnikov, Evie Lacks, MD  triamcinolone ointment (KENALOG) 0.1 % Apply 1 application topically 2 (two) times daily. 06/12/16  Yes Plotnikov, Evie Lacks, MD  acetaminophen (TYLENOL) 500 MG tablet Take 500 mg by mouth every 6 (six) hours as needed for headache.    [provider]  AMBULATORY NON FORMULARY MEDICATION Diltiazem gel 2% Apply  small amount pea-sized to anal area 2-3 times a day for anal stenosis. Use as needed. Patient not taking: Reported on 02/01/2017 06/07/15   Plotnikov, Evie Lacks, MD  Artificial Tear Ointment (DRY EYES OP) Apply 1 drop to eye daily.    [provider]  donepezil (ARICEPT) 10 MG tablet TAKE 1 TABLET BY MOUTH EVERY NIGHT AT BEDTIME 02/03/17   Plotnikov, Evie Lacks, MD  fluticasone (FLONASE) 50 MCG/ACT nasal spray Place 2 sprays into both nostrils daily. Patient not taking: Reported on 02/02/2017 05/26/14   Plotnikov, Evie Lacks, MD  nitroGLYCERIN (NITROSTAT) 0.4 MG SL tablet Place 1 tablet (0.4 mg total) under the tongue every 5 (five) minutes as needed. Chest pain 03/13/16 03/12/18  Plotnikov, Evie Lacks, MD    Current Facility-Administered Medications  Medication Dose Route Frequency Provider Last Rate Last Dose  . acetaminophen (TYLENOL) tablet 650 mg  650 mg Oral Q6H PRN Rise Patience, MD      . acetylcysteine (MUCOMYST) 20 % nebulizer / oral solution 4 mL  4 mL Nebulization BID Candiss Norse,  Margaree Mackintosh, MD      . albuterol (PROVENTIL) (2.5 MG/3ML) 0.083% nebulizer solution 2.5 mg  2.5 mg Nebulization Q4H PRN Rise Patience, MD      . atorvastatin (LIPITOR) tablet 10 mg  10 mg Oral BID Rise Patience, MD   10 mg at 02/02/17 2344  . azithromycin (ZITHROMAX) 500 mg in dextrose 5 % 250 mL IVPB  500 mg Intravenous Q24H Thurnell Lose, MD   Stopped at 02/03/17 1640  . calcium carbonate (OS-CAL - dosed in mg of elemental calcium) tablet 500 mg of elemental calcium  500 mg of elemental calcium Oral BID WC Rise Patience, MD      . cefTRIAXone (ROCEPHIN) 1 g in dextrose 5 % 50 mL IVPB  1 g Intravenous Q24H Shade, Haze Justin, RPH      . cholecalciferol (VITAMIN D) tablet 2,000 Units  2,000 Units Oral Daily Rise Patience, MD      . docusate sodium (COLACE) capsule 100 mg  100 mg Oral Daily PRN Rise Patience, MD      . donepezil (ARICEPT) tablet 10 mg  10 mg Oral QHS  Rise Patience, MD   10 mg at 02/02/17 2338  . enoxaparin (LOVENOX) injection 30 mg  30 mg Subcutaneous QHS Rise Patience, MD   30 mg at 02/02/17 2338  . fentaNYL (SUBLIMAZE) injection 50 mcg  50 mcg Intravenous Q4H PRN Thurnell Lose, MD   50 mcg at 02/03/17 1610  . hydrALAZINE (APRESOLINE) injection 10 mg  10 mg Intravenous Q6H PRN Thurnell Lose, MD      . ipratropium-albuterol (DUONEB) 0.5-2.5 (3) MG/3ML nebulizer solution 3 mL  3 mL Nebulization BID Thurnell Lose, MD      . isosorbide dinitrate (ISORDIL) tablet 20 mg  20 mg Oral TID Thurnell Lose, MD      . levothyroxine (SYNTHROID, LEVOTHROID) tablet 112 mcg  112 mcg Oral QAC breakfast Rise Patience, MD      . multivitamin with minerals tablet 1 tablet  1 tablet Oral Daily Rise Patience, MD      . NIFEdipine (PROCARDIA) capsule 10 mg  10 mg Oral TID AC Thurnell Lose, MD      . nitroGLYCERIN (NITROLINGUAL) 0.4 MG/SPRAY spray 1 spray  1 spray Sublingual TID AC Thurnell Lose, MD      . nitroGLYCERIN (NITROSTAT) SL tablet 0.4 mg  0.4 mg Sublingual Q5 min PRN Rise Patience, MD   0.4 mg at 02/03/17 0147  . ondansetron (ZOFRAN) injection 4 mg  4 mg Intravenous Q6H PRN Rise Patience, MD      . pantoprazole (PROTONIX) EC tablet 40 mg  40 mg Oral Daily Rise Patience, MD      . tamsulosin [facility]) capsule 0.4 mg  0.4 mg Oral QPM Rise Patience, MD   0.4 mg at 02/02/17 2345  . triamcinolone (NASACORT) nasal inhaler 2 spray  2 spray Nasal Daily Rise Patience, MD   2 spray at 02/03/17 1334  . triamcinolone ointment (KENALOG) 0.1 % 1 application  1 application Topical BID Rise Patience, MD   1 application at 54/62/70 2339    Allergies as of 02/02/2017 - Review Complete 02/02/2017  Allergen Reaction Noted  . Aleve [naproxen sodium] Other (See Comments) 10/26/2014  . Ibuprofen Other (See Comments) 10/26/2014  . Tramadol  08/25/2015    Family History  Problem  Relation Age of Onset  .  Cancer Sister   . Coronary artery disease Unknown        male 1st degree relative<60  . Hypertension Unknown     Social History   Socioeconomic History  . Marital status: Widowed    Spouse name: Not on file  . Number of children: 2  . Years of education: Not on file  . Highest education level: Not on file  Social Needs  . Financial resource strain: Not on file  . Food insecurity - worry: Not on file  . Food insecurity - inability: Not on file  . Transportation needs - medical: Not on file  . Transportation needs - non-medical: Not on file  Occupational History  . Occupation: retired    Fish farm manager: RETIRED  Tobacco Use  . Smoking status: Former Smoker    Packs/day: 1.00    Years: 30.00    Pack years: 30.00    Last attempt to quit: 10/26/1976    Years since quitting: 40.3  . Smokeless tobacco: Never Used  Substance and Sexual Activity  . Alcohol use: No  . Drug use: No  . Sexual activity: Not Currently  Other Topics Concern  . Not on file  Social History Narrative   Regular exercise--no.    Review of Systems: All systems reviewed and negative except where noted in HPI.  Physical Exam: Vital signs in last 24 hours: Temp:  [97.4 F (36.3 C)-97.9 F (36.6 C)] 97.7 F (36.5 C) (11/12 1453) Pulse Rate:  [59-101] 61 (11/12 1453) Resp:  [17-20] 20 (11/12 1453) BP: (107-173)/(41-83) 158/72 (11/12 1453) SpO2:  [90 %-100 %] 97 % (11/12 1513) Weight:  [145 lb (65.8 kg)-149 lb 4 oz (67.7 kg)] 149 lb 4 oz (67.7 kg) (11/11 2251)   General:   Alert, well-developed, white male in NAD Psych:  Pleasant, cooperative. Normal mood and affect. Eyes:  Pupils equal, sclera clear, no icterus.   Conjunctiva pink. Ears:  Normal auditory acuity. Nose:  No deformity, discharge,  or lesions. Neck:  Supple; no masses Lungs:  Clear throughout to auscultation.   No wheezes, crackles, or rhonchi.  Heart:  Regular rate and rhythm; no murmurs, no edema Abdomen:   Soft, non-distended, nontender, BS active    Rectal:  Deferred  Msk:  Symmetrical without gross deformities. . Neurologic:  Alert and  oriented x4;  grossly normal neurologically. Skin:  Intact without significant lesions or rashes..   Intake/Output from previous day: 11/11 0701 - 11/12 0700 In: 1440.8 [P.O.:30; I.V.:360.8; IV Piggyback:1050] Out: 250 [Urine:250] Intake/Output this shift: Total I/O In: 0  Out: 700 [Urine:700]  Lab Results: Recent Labs    02/01/17 1231 02/02/17 1826 02/03/17 0237  WBC 9.6 12.9* 10.3  HGB 11.2* 12.5* 10.7*  HCT 33.0* 36.5* 31.7*  PLT 193 197 170   BMET Recent Labs    02/01/17 1231 02/02/17 1826 02/03/17 0237  NA 130* 128* 130*  K 4.3 5.1 4.4  CL 97* 96* 104  CO2 21* 21* 20*  GLUCOSE 111* 107* 95  BUN 38* 36* 31*  CREATININE 1.89* 1.68* 1.48*  CALCIUM 8.7* 9.0 8.1*   LFT Recent Labs    02/02/17 1826  PROT 7.3  ALBUMIN 3.6  AST 29  ALT 19  ALKPHOS 383*  BILITOT 0.8   PT/INR No results for input(s): LABPROT, INR in the last 72 hours. Hepatitis Panel No results for input(s): HEPBSAG, HCVAB, HEPAIGM, HEPBIGM in the last 72 hours.    Studies/Results: Dg Esophagus  Result Date: 02/03/2017 CLINICAL DATA:  Dysphagia. EXAM: ESOPHOGRAM/BARIUM SWALLOW TECHNIQUE: Single contrast examination was performed using  thin barium. FLUOROSCOPY TIME:  Fluoroscopy Time:  2.8 minutes Radiation Exposure Index (if provided by the fluoroscopic device): 46.9 mGy COMPARISON:  None. FINDINGS: The esophagus is slightly tortuous with extensive intermittent spasms with severe spasm just above the gastroesophageal junction. There is extensive to and fro peristalsis with contrast extending back into the oropharynx repeatedly, even in the semi-erect position. There is occasional relaxation of severe spasm in the distal esophagus. The lumen during relaxation is approximately 6-7 mm in diameter. No visible masses. Because of the severe distal spasm, I did not  give the patient a barium tablet. IMPRESSION: 1. Extensive spasm throughout the esophagus with severe spasm in the distal esophagus just above the gastroesophageal junction. This area does intermittently relax to a diameter of approximately 6-7 mm. 2. To and fro peristalsis in the upper esophagus with reflux into the oropharynx repeatedly. Electronically Signed   By: Lorriane Shire M.D.   On: 02/03/2017 15:21   Dg Chest Port 1 View  Result Date: 02/02/2017 CLINICAL DATA:  83 year old male with cough and fever. EXAM: PORTABLE CHEST 1 VIEW COMPARISON:  Chest radiograph dated 02/01/2017 FINDINGS: There is shallow inspiration with bibasilar linear atelectasis/ scarring. Stable calcified granuloma at the right lung base. No focal consolidation, pleural effusion, or pneumothorax. The cardiac silhouette is within normal limits. There is atherosclerotic calcification of the aorta. Left pectoral dual lead AICD device. There is osteopenia with degenerative changes of the spine. No acute osseous pathology. IMPRESSION: No active disease. Electronically Signed   By: Anner Crete M.D.   On: 02/02/2017 19:10   Dg Swallowing Func-speech Pathology  Result Date: 02/03/2017 Objective Swallowing Evaluation: Type of Study: MBS-Modified Barium Swallow Study  Patient Details Name: VALENTINO SAAVEDRA MRN: [id number] Date of Birth: [birth year] Today's Date: 02/03/2017 Time: SLP Start Time (ACUTE ONLY): 2706 -SLP Stop Time (ACUTE ONLY): 1420 SLP Time Calculation (min) (ACUTE ONLY): 25 min Past Medical History: Past Medical History: Diagnosis Date . Cancer ([place])   Stomach . CHF (congestive heart failure) ([place])  . Collagen vascular disease ([place])  . Hypertension  Past Surgical History: Past Surgical History: Procedure Laterality Date . APPENDECTOMY   . COLONOSCOPY  12/1998, 02/2004  diverticulosois, external hemorrhoids . CRANIOTOMY Left  . ESOPHAGOGASTRODUODENOSCOPY  06/17/2014  Dr. Oneida Alar: 1. Patent stricture at the gastroesophageal  junction 2. UGIB Due to multiple  gastric ulcers 3. Moderate Duodentitis. Negative H.pylori . heart disease    permanent pacemaker . HIP FRACTURE SURGERY  2011  ORIF  R. . INGUINAL HERNIA REPAIR   . IR GENERIC HISTORICAL  10/26/2015  IR RADIOLOGIST EVAL & MGMT 10/26/2015 [facility] . PACEMAKER INSERTION   HPI: pt is a 83 yo male adm to [facility] with UTI/fever.  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  He does report some difficulties after his stomach surgery in March 2016 to treat his cancer.  CXR negative.  Pt admits to progressive problems with swallowing noted recently but denies sudden onset.  He denies ever having swallow evaluation completed previously.   Subjective: pt awake in chair Assessment / Plan / Recommendation CHL IP CLINICAL IMPRESSIONS 02/03/2017 Clinical Impression Pt presents with functional oral swallow with pharyngocervical esophageal *suspect primary esophageal* deficits.  He was only tested with a few liquid boluses due to concerns for primary esophageal deficits and need for esophagram.  Premature spillage  of boluses into pharynx observed - with swallow triggering at pyriform sinus with thin liquid via tsp.  Trace vallecular/pyriform sinus residual noted with nectar which pt independently dry swallows to clear.  Trace laryngeal penetration of thin before swallow due to decreased timing of laryngeal closure.  Pt did not aspirate but he does demonstrate backflow of liquid barium into pharynx/open larynx.  Upon esophageal sweep, his esophagus appeared full - ? spasms. Trace laryngeal penetration of thin/nectar backflowed material observed with pt conducting further swallows to help clear.  Suspect he is having some aspiration primarily due to his esophageal deficits.  Esophagram to follow, will follow up.  Educated pt to findings/concerns using teach back.   SLP Visit Diagnosis Dysphagia,  pharyngoesophageal phase (R13.14);Dysphagia, unspecified (R13.10) Attention and concentration deficit following -- Frontal lobe and executive function deficit following -- Impact on safety and function Risk for inadequate nutrition/hydration;Moderate aspiration risk   CHL IP TREATMENT RECOMMENDATION 02/03/2017 Treatment Recommendations Therapy as outlined in treatment plan below   Prognosis 02/03/2017 Prognosis for Safe Diet Advancement Guarded Barriers to Reach Goals -- Barriers/Prognosis Comment -- CHL IP DIET RECOMMENDATION 02/03/2017 SLP Diet Recommendations NPO;Ice chips PRN after oral care Liquid Administration via -- Medication Administration -- Compensations -- Postural Changes --   CHL IP OTHER RECOMMENDATIONS 02/03/2017 Recommended Consults Consider esophageal assessment Oral Care Recommendations -- Other Recommendations --   CHL IP FOLLOW UP RECOMMENDATIONS 03/16/2015 Follow up Recommendations None   CHL IP FREQUENCY AND DURATION 02/03/2017 Speech Therapy Frequency (ACUTE ONLY) min 1 x/week Treatment Duration 1 week      CHL IP ORAL PHASE 02/03/2017 Oral Phase Impaired Oral - Pudding Teaspoon -- Oral - Pudding Cup -- Oral - Honey Teaspoon -- Oral - Honey Cup -- Oral - Nectar Teaspoon -- Oral - Nectar Cup -- Oral - Nectar Straw WFL Oral - Thin Teaspoon WFL Oral - Thin Cup -- Oral - Thin Straw WFL Oral - Puree -- Oral - Mech Soft -- Oral - Regular -- Oral - Multi-Consistency -- Oral - Pill -- Oral Phase - Comment --  CHL IP PHARYNGEAL PHASE 02/03/2017 Pharyngeal Phase WFL Pharyngeal- Pudding Teaspoon -- Pharyngeal -- Pharyngeal- Pudding Cup -- Pharyngeal -- Pharyngeal- Honey Teaspoon -- Pharyngeal -- Pharyngeal- Honey Cup -- Pharyngeal -- Pharyngeal- Nectar Teaspoon -- Pharyngeal -- Pharyngeal- Nectar Cup -- Pharyngeal -- Pharyngeal- Nectar Straw WFL;Delayed swallow initiation-pyriform sinuses;Pharyngeal residue - valleculae;Pharyngeal residue - pyriform Pharyngeal -- Pharyngeal- Thin Teaspoon WFL  Pharyngeal -- Pharyngeal- Thin Cup -- Pharyngeal -- Pharyngeal- Thin Straw WFL;Penetration/Apiration after swallow;Penetration/Aspiration before swallow;Reduced airway/laryngeal closure;Reduced laryngeal elevation;Pharyngeal residue - valleculae;Pharyngeal residue - pyriform Pharyngeal Material enters airway, remains ABOVE vocal cords then ejected out;Material enters airway, remains ABOVE vocal cords and not ejected out Pharyngeal- Puree -- Pharyngeal -- Pharyngeal- Mechanical Soft -- Pharyngeal -- Pharyngeal- Regular -- Pharyngeal -- Pharyngeal- Multi-consistency -- Pharyngeal -- Pharyngeal- Pill -- Pharyngeal -- Pharyngeal Comment --  CHL IP CERVICAL ESOPHAGEAL PHASE 02/03/2017 Cervical Esophageal Phase Impaired Pudding Teaspoon -- Pudding Cup -- Honey Teaspoon -- Honey Cup -- Nectar Teaspoon -- Nectar Cup -- Nectar Straw Esophageal backflow into the pharynx;Esophageal backflow into cervical esophagus;Reduced cricopharyngeal relaxation Thin Teaspoon Esophageal backflow into the pharynx;Esophageal backflow into cervical esophagus Thin Cup -- Thin Straw Esophageal backflow into the pharynx;Esophageal backflow into cervical esophagus Puree -- Mechanical Soft -- Regular -- Multi-consistency -- Pill -- Cervical Esophageal Comment ? reduced relaxation of cricopharyngeus and esophageal backflow into pharynx, pt conducts multiple swallows CHL IP GO 02/03/2017 Functional Assessment Tool Used clinical  judgement Functional Limitations Swallowing Swallow Current Status [phone] CM Swallow Goal Status (E4835) CL Swallow Discharge Status (Y7573) (None) Motor Speech Current Status [phone]) (None) Motor Speech Goal Status [phone]) (None) Motor Speech Goal Status [phone]) (None) Spoken Language Comprehension Current Status [phone]) (None) Spoken Language Comprehension Goal Status (Y5486) (None) Spoken Language Comprehension Discharge Status [phone]) (None) Spoken Language Expression Current Status [phone]) (None) Spoken Language  Expression Goal Status [phone]) (None) Spoken Language Expression Discharge Status [phone]) (None) Attention Current Status (Z9923) (None) Attention Goal Status (C1443) (None) Attention Discharge Status [phone]) (None) Memory Current Status (E0063) (None) Memory Goal Status (G9494) (None) Memory Discharge Status (I7395) (None) Voice Current Status (K4417) (None) Voice Goal Status (L2787) (None) Voice Discharge Status (Z8367) (None) Other Speech-Language Pathology Functional Limitation Current Status (Q5500) (None) Other Speech-Language Pathology Functional Limitation Goal Status (T6429) (None) Other Speech-Language Pathology Functional Limitation Discharge Status [phone]) (None) Luanna Salk, [facility] SLP [place] 02/03/2017, 4:24 PM                Tye Savoy, NP-C @  02/03/2017, 5:03 PM  Pager number [phone]  GI ATTENDING  History, laboratories, x-rays, prior endoscopy report elsewhere reviewed. Patient personally seen and examined. Agree with comprehensive consultation note as outlined above. Patient has had weight loss and progressive difficulty swallowing with regurgitation of oral contents. Esophagram suggests dysmotility, possibly achalasia versus pseudo-achalasia. Needs EGD to rule out pseudo-achalasia. The patient is high risk given his age and comorbidities. The nature of the procedure, as well as the risks, benefits, and alternatives were carefully and thoroughly reviewed with the patient. Ample time for discussion and questions allowed. The patient understood, was satisfied, and agreed to proceed. If negative, would recommend esophageal manometry.  Docia Chuck. Geri Seminole., M.D. [facility] Division of Gastroenterology

## 2017-02-03 NOTE — Care Management Obs Status (Signed)
MEDICARE OBSERVATION STATUS NOTIFICATION   Patient Details  Name: CARDELL RACHEL MRN: [id number] Date of Birth: [birth year]   Medicare Observation Status Notification Given:  Yes    MahabirJuliann Pulse, RN 02/03/2017, 3:04 PM

## 2017-02-03 NOTE — Progress Notes (Signed)
A SL NTG tablet was administered. Then the vitals with the patient now laying on his right side were:97.94F;HR 62;RR20;107/41;96% 2 L/Min Nasal Canula. The PCP on call was notified.

## 2017-02-03 NOTE — Progress Notes (Signed)
PT demonstrated hands on understanding of Flutter device. 

## 2017-02-03 NOTE — Progress Notes (Signed)
PT Cancellation Note  Patient Details Name: [name] [name] MRN: [id number] DOB: September 11, 1922   Cancelled Treatment:    Reason Eval/Treat Not Completed: Patient at procedure or test/unavailable   Claretha Cooper 02/03/2017, 2:02 PM Tresa Endo PT [phone]

## 2017-02-03 NOTE — Progress Notes (Signed)
@IPLOG @        PROGRESS NOTE                                                                                                                                                                                                             Patient Demographics:    [name] [name], is a 83 y.o. male, DOB - [birth year], RKY:[id number]  Admit date - 02/02/2017   Admitting Physician Rise Patience, MD  Outpatient Primary MD for the patient is Plotnikov, Evie Lacks, MD  LOS - 0  Chief Complaint  Patient presents with  . Chills  . Cough       Brief Narrative  [name] [name] is a 83 y.o. male with history of chronic systolic heart failure status post ICD placement, history of sinus node dysfunction status post pacemaker placement, history of B-cell lymphoma in remission, chronic kidney disease stage IV, chronic anemia hypothyroidism cirrhosis of liver, hypertension presents to the ER for the second time in 48 hours for fever and chills.  Patient has come to the ER 2 days ago with dysuria shortness of breath and fever chills.  At that time patient was diagnosed with UTI chest x-ray was unremarkable and patient was discharged back on antibiotics.  Despite taking which patient started developing some shortness of breath cough and fever chills and weakness this afternoon.  Patient came back to the ER.     Subjective:    [name] [name] today has, No headache, No chest pain, No abdominal pain - No Nausea, No new weakness tingling or numbness, +ve Cough - SOB.     Assessment  & Plan :    1.  Fever chills due to combination of UTI and URI.  Responding well to Rocephin, still has productive cough and some shortness of breath, will add azithromycin as well, negative for influenza, check sputum Gram stain culture, 2 view chest x-ray unremarkable.  Added nebulizer treatments, Mucomyst and flutter valve for pulmonary toiletry, continue to monitor.  2.  Mild acute on chronic systolic CHF EF 31-51%.  Has  AICD.  Hold further IV fluids, gentle Lasix as he has few rails, is under the care of Dr. Crissie Sickles, not on beta-blocker, ACE/ARB due to renal insufficiency and low blood pressure.  Defer any change in long-standing cardiac medications to his cardiologist.  3.  BPH.  On Flomax.  4.  Hypothyroidism.  Synthroid 112 mcg continued, TSH borderline, repeat TSH in 4-6 weeks.  5.  Mild early dementia.  On Aricept continue, at  risk for delirium, minimize narcotics and benzodiazepines.  May require placement.  6.  Dysphasia. Has history of gastric lymphoma in the past, last EGD in 2016 by Dr. Lovey Newcomer feels unremarkable, continue PPI, speech following, may require further studies based on speech evaluation.  7.  Dyslipidemia.  On statin continue.  8.  CKD 4.  Baseline creatinine around 1.6.  Continue to monitor.  9.  Anemia of chronic disease.  Stable no acute issues.  10.  Falls, deconditioning.  PT eval.  May require SNF.  11.  B-cell gastric lymphoma.  Under remission.  Follows with Dr. Heath Lark oncologist and Dr. Barney Drain GI.    Diet : Diet Heart Room service appropriate? Yes; Fluid consistency: Thin    Family Communication  :  None  Code Status :  Full  Disposition Plan  :  TBD  Consults  :  None  Procedures  :      DVT Prophylaxis  :  Lovenox   Lab Results  Component Value Date   PLT 170 02/03/2017    Inpatient Medications  Scheduled Meds: . acetylcysteine  4 mL Nebulization BID  . atorvastatin  10 mg Oral BID  . calcium carbonate  500 mg of elemental calcium Oral BID WC  . cholecalciferol  2,000 Units Oral Daily  . donepezil  10 mg Oral QHS  . enoxaparin (LOVENOX) injection  30 mg Subcutaneous QHS  . ipratropium-albuterol  3 mL Nebulization Q6H  . levothyroxine  112 mcg Oral QAC breakfast  . multivitamin with minerals  1 tablet Oral Daily  . pantoprazole  40 mg Oral Daily  . tamsulosin  0.4 mg Oral QPM  . triamcinolone  2 spray Nasal Daily  . triamcinolone  ointment  1 application Topical BID   Continuous Infusions: . sodium chloride 50 mL/hr at 02/02/17 2345  . azithromycin    . cefTRIAXone (ROCEPHIN)  IV     PRN Meds:.acetaminophen **OR** [DISCONTINUED] acetaminophen, albuterol, docusate sodium, hydrALAZINE, nitroGLYCERIN, [DISCONTINUED] ondansetron **OR** ondansetron (ZOFRAN) IV  Antibiotics  :    Anti-infectives (From admission, onward)   Start     Dose/Rate Route Frequency Ordered Stop   02/03/17 2000  cefTRIAXone (ROCEPHIN) 1 g in dextrose 5 % 50 mL IVPB     1 g 100 mL/hr over 30 Minutes Intravenous Every 24 hours 02/03/17 1104     02/03/17 1400  azithromycin (ZITHROMAX) 500 mg in dextrose 5 % 250 mL IVPB     500 mg 250 mL/hr over 60 Minutes Intravenous Every 24 hours 02/03/17 1239     02/02/17 2030  cefTRIAXone (ROCEPHIN) 1 g in dextrose 5 % 50 mL IVPB     1 g 100 mL/hr over 30 Minutes Intravenous  Once 02/02/17 2019 02/02/17 2117         Objective:   Vitals:   02/02/17 2251 02/03/17 0145 02/03/17 0152 02/03/17 0441  BP: (!) 159/81 (!) 148/58 (!) 107/41 (!) 124/53  Pulse: 67 (!) 59 62 60  Resp: 18 20 20 17   Temp: 97.6 F (36.4 C) 97.6 F (36.4 C) 97.6 F (36.4 C) 97.6 F (36.4 C)  TempSrc: Oral Oral Oral Oral  SpO2: 90% 99% 96% 97%  Weight: 67.7 kg (149 lb 4 oz)     Height:        Wt Readings from Last 3 Encounters:  02/02/17 67.7 kg (149 lb 4 oz)  01/09/17 66.7 kg (147 lb)  10/25/16 68.9 kg (152 lb)     Intake/Output  Summary (Last 24 hours) at 02/03/2017 1243 Last data filed at 02/03/2017 0911 Gross per 24 hour  Intake 1440.83 ml  Output 950 ml  Net 490.83 ml     Physical Exam  Awake Alert,  No new F.N deficits, Normal affect [place].AT,PERRAL Supple Neck,No JVD, No cervical lymphadenopathy appriciated.  Symmetrical Chest wall movement, Good air movement bilaterally, few rales RRR,No Gallops,Rubs or new Murmurs, No Parasternal Heave +ve B.Sounds, Abd Soft, No tenderness, No organomegaly appriciated,  No rebound - guarding or rigidity. No Cyanosis, Clubbing or edema, No new Rash or bruise     Data Review:    CBC Recent Labs  Lab 02/01/17 1231 02/02/17 1826 02/03/17 0237  WBC 9.6 12.9* 10.3  HGB 11.2* 12.5* 10.7*  HCT 33.0* 36.5* 31.7*  PLT 193 197 170  MCV 92.2 92.9 93.5  MCH 31.3 31.8 31.6  MCHC 33.9 34.2 33.8  RDW 15.2 15.3 15.2  LYMPHSABS  --  1.4  --   MONOABS  --  2.0*  --   EOSABS  --  0.5  --   BASOSABS  --  0.0  --     Chemistries  Recent Labs  Lab 02/01/17 1231 02/02/17 1826 02/03/17 0237  NA 130* 128* 130*  K 4.3 5.1 4.4  CL 97* 96* 104  CO2 21* 21* 20*  GLUCOSE 111* 107* 95  BUN 38* 36* 31*  CREATININE 1.89* 1.68* 1.48*  CALCIUM 8.7* 9.0 8.1*  AST  --  29  --   ALT  --  19  --   ALKPHOS  --  383*  --   BILITOT  --  0.8  --    ------------------------------------------------------------------------------------------------------------------ No results for input(s): CHOL, HDL, LDLCALC, TRIG, CHOLHDL, LDLDIRECT in the last 72 hours.  No results found for: HGBA1C ------------------------------------------------------------------------------------------------------------------ Recent Labs    02/03/17 0815  TSH 4.962*   ------------------------------------------------------------------------------------------------------------------ No results for input(s): VITAMINB12, FOLATE, FERRITIN, TIBC, IRON, RETICCTPCT in the last 72 hours.  Coagulation profile No results for input(s): INR, PROTIME in the last 168 hours.  No results for input(s): DDIMER in the last 72 hours.  Cardiac Enzymes Recent Labs  Lab 02/03/17 0237 02/03/17 0815  TROPONINI <0.03 <0.03   ------------------------------------------------------------------------------------------------------------------    Component Value Date/Time   BNP 355.0 (H) 10/26/2014 1003    Micro Results Recent Results (from the past 240 hour(s))  MRSA PCR Screening     Status: None   Collection  Time: 02/03/17  5:14 AM  Result Value Ref Range Status   MRSA by PCR NEGATIVE NEGATIVE Final    Comment:        The GeneXpert MRSA Assay (FDA approved for NASAL specimens only), is one component of a comprehensive MRSA colonization surveillance program. It is not intended to diagnose MRSA infection nor to guide or monitor treatment for MRSA infections.   Culture, expectorated sputum-assessment     Status: None   Collection Time: 02/03/17  9:30 AM  Result Value Ref Range Status   Specimen Description EXPECTORATED SPUTUM  Final   Special Requests NONE  Final   Sputum evaluation   Final    Sputum specimen not acceptable for testing.  Please recollect.   INFORMED DUNKLEBERGER,C. RN @1147  ON 11.12.18 BY NMCCOY    Report Status 02/03/2017 FINAL  Final    Radiology Reports Dg Chest 2 View  Result Date: 02/01/2017 CLINICAL DATA:  Increasingly short of breath since fall 3 weeks ago. EXAM: CHEST  2 VIEW COMPARISON:  05/15/2015 FINDINGS: The  heart remains normal in size. Aorta rib is tortuous. Mediastinum is rotated to the right limiting evaluation of the mediastinum. Lungs are under aerated. Chronic interstitial changes. Calcified granulomata at the right lung base. Stable thoracic spine. Osteopenia. No pneumothorax or pleural effusion. AICD device is unchanged via left subclavian vein. Chronic sternal deformity. IMPRESSION: No active cardiopulmonary disease. Electronically Signed   By: Marybelle Killings M.D.   On: 02/01/2017 11:54   Dg Ribs Unilateral Left  Result Date: 02/01/2017 CLINICAL DATA:  Increasing SOB since fall 3 weeks ago; left lower rib pain x 3 weeks; hx gastric CA, CHF, HTN; ex smoker quit 40 years ago; EXAM: LEFT RIBS - 2 VIEW COMPARISON:  Current chest radiograph. Previous chest radiograph, 05/15/2015. FINDINGS: No rib fracture or rib lesion.  Bony thorax is demineralized. No left lung consolidation. No pleural effusion or evidence of a pneumothorax. IMPRESSION: No rib fracture  or rib lesion. Electronically Signed   By: Lajean Manes M.D.   On: 02/01/2017 11:50    Dg Chest Port 1 View  Result Date: 02/02/2017 CLINICAL DATA:  83 year old male with cough and fever. EXAM: PORTABLE CHEST 1 VIEW COMPARISON:  Chest radiograph dated 02/01/2017 FINDINGS: There is shallow inspiration with bibasilar linear atelectasis/ scarring. Stable calcified granuloma at the right lung base. No focal consolidation, pleural effusion, or pneumothorax. The cardiac silhouette is within normal limits. There is atherosclerotic calcification of the aorta. Left pectoral dual lead AICD device. There is osteopenia with degenerative changes of the spine. No acute osseous pathology. IMPRESSION: No active disease. Electronically Signed   By: Anner Crete M.D.   On: 02/02/2017 19:10    Time Spent in minutes  30   Lala Lund M.D on 02/03/2017 at 12:43 PM  Between 7am to 7pm - Pager - [phone] ( page via [email or web address], text pages only, please mention full 10 digit call back number). After 7pm go to [email or web address] - password [email or web address]

## 2017-02-04 ENCOUNTER — Encounter (HOSPITAL_COMMUNITY)
Admission: EM | Disposition: A | Discharge status: Home Health | Payer: Self-pay | Source: Home / Self Care | Attending: Family Medicine

## 2017-02-04 ENCOUNTER — Inpatient Hospital Stay (HOSPITAL_COMMUNITY): Payer: Medicare Other | Admitting: Registered Nurse

## 2017-02-04 ENCOUNTER — Encounter (HOSPITAL_COMMUNITY): Payer: Self-pay | Admitting: *Deleted

## 2017-02-04 DIAGNOSIS — K229 Disease of esophagus, unspecified: Secondary | ICD-10-CM

## 2017-02-04 DIAGNOSIS — R1310 Dysphagia, unspecified: Secondary | ICD-10-CM

## 2017-02-04 DIAGNOSIS — Q399 Congenital malformation of esophagus, unspecified: Secondary | ICD-10-CM

## 2017-02-04 DIAGNOSIS — K224 Dyskinesia of esophagus: Secondary | ICD-10-CM

## 2017-02-04 DIAGNOSIS — K228 Other specified diseases of esophagus: Secondary | ICD-10-CM

## 2017-02-04 HISTORY — PX: ESOPHAGOGASTRODUODENOSCOPY (EGD) WITH PROPOFOL: SHX5813

## 2017-02-04 LAB — BASIC METABOLIC PANEL
ANION GAP: 8 (ref 5–15)
BUN: 32 mg/dL — ABNORMAL HIGH (ref 6–20)
CALCIUM: 8.2 mg/dL — AB (ref 8.9–10.3)
CHLORIDE: 104 mmol/L (ref 101–111)
CO2: 20 mmol/L — AB (ref 22–32)
CREATININE: 1.64 mg/dL — AB (ref 0.61–1.24)
GFR calc Af Amer: 40 mL/min — ABNORMAL LOW (ref >60)
GFR calc non Af Amer: 35 mL/min — ABNORMAL LOW (ref >60)
GLUCOSE: 93 mg/dL (ref 65–99)
Potassium: 4.1 mmol/L (ref 3.5–5.1)
Sodium: 132 mmol/L — ABNORMAL LOW (ref 135–145)

## 2017-02-04 LAB — MAGNESIUM: Magnesium: 1.6 mg/dL — ABNORMAL LOW (ref 1.7–2.4)

## 2017-02-04 LAB — GLUCOSE, CAPILLARY
GLUCOSE-CAPILLARY: 76 mg/dL (ref 65–99)
Glucose-Capillary: 75 mg/dL (ref 65–99)

## 2017-02-04 LAB — CBC
HEMATOCRIT: 29.8 % — AB (ref 39.0–52.0)
Hemoglobin: 9.9 g/dL — ABNORMAL LOW (ref 13.0–17.0)
MCH: 31.1 pg (ref 26.0–34.0)
MCHC: 33.2 g/dL (ref 30.0–36.0)
MCV: 93.7 fL (ref 78.0–100.0)
Platelets: 174 10*3/uL (ref 150–400)
RBC: 3.18 MIL/uL — ABNORMAL LOW (ref 4.22–5.81)
RDW: 15.1 % (ref 11.5–15.5)
WBC: 9.4 10*3/uL (ref 4.0–10.5)

## 2017-02-04 LAB — LACTIC ACID, PLASMA: Lactic Acid, Venous: 0.7 mmol/L (ref 0.5–1.9)

## 2017-02-04 SURGERY — ESOPHAGOGASTRODUODENOSCOPY (EGD) WITH PROPOFOL
Anesthesia: Monitor Anesthesia Care

## 2017-02-04 MED ORDER — FUROSEMIDE 10 MG/ML IJ SOLN
40.0000 mg | Freq: Once | INTRAMUSCULAR | Status: AC
  Administered 2017-02-04: 40 mg via INTRAVENOUS
  Filled 2017-02-04: qty 4

## 2017-02-04 MED ORDER — ISOSORBIDE DINITRATE 5 MG PO TABS
5.0000 mg | ORAL_TABLET | Freq: Three times a day (TID) | ORAL | Status: DC
  Administered 2017-02-04 – 2017-02-09 (×9): 5 mg via ORAL
  Filled 2017-02-04 (×18): qty 1

## 2017-02-04 MED ORDER — ETOMIDATE 2 MG/ML IV SOLN
INTRAVENOUS | Status: DC | PRN
  Administered 2017-02-04 (×3): 2 mg via INTRAVENOUS

## 2017-02-04 MED ORDER — LACTATED RINGERS IV SOLN
INTRAVENOUS | Status: DC | PRN
  Administered 2017-02-04: 11:00:00 via INTRAVENOUS

## 2017-02-04 MED ORDER — FUROSEMIDE 10 MG/ML IJ SOLN: 20.0000 mg | Freq: Once | INTRAMUSCULAR | Status: DC

## 2017-02-04 MED ORDER — ETOMIDATE 2 MG/ML IV SOLN
INTRAVENOUS | Status: AC
  Filled 2017-02-04: qty 10

## 2017-02-04 MED ORDER — LIDOCAINE 2% (20 MG/ML) 5 ML SYRINGE
INTRAMUSCULAR | Status: AC
  Filled 2017-02-04: qty 5

## 2017-02-04 MED ORDER — PROPOFOL 10 MG/ML IV BOLUS
INTRAVENOUS | Status: AC
  Filled 2017-02-04: qty 40

## 2017-02-04 MED ORDER — SODIUM CHLORIDE 0.9 % IV SOLN: INTRAVENOUS | Status: DC

## 2017-02-04 SURGICAL SUPPLY — 14 items

## 2017-02-04 NOTE — Transfer of Care (Signed)
Immediate Anesthesia Transfer of Care Note  Patient: [name] [name]  Procedure(s) Performed: ESOPHAGOGASTRODUODENOSCOPY (EGD) WITH PROPOFOL (N/A )  Patient Location: PACU and Endoscopy Unit  Anesthesia Type:MAC  Level of Consciousness: awake, alert , oriented and patient cooperative  Airway & Oxygen Therapy: Patient Spontanous Breathing and Patient connected to nasal cannula oxygen  Post-op Assessment: Report given to RN, Post -op Vital signs reviewed and stable and Patient moving all extremities  Post vital signs: Reviewed and stable  Last Vitals:  Vitals:   02/04/17 1030 02/04/17 1102  BP:  (!) 165/59  Pulse: 61 62  Resp: 17 18  Temp:  36.5 C  SpO2: 98% 99%    Last Pain:  Vitals:   02/04/17 1102  TempSrc: Oral  PainSc:       Patients Stated Pain Goal: 5 (33/29/51 8841)  Complications: No apparent anesthesia complications

## 2017-02-04 NOTE — Progress Notes (Signed)
PT demonstrated hands on understanding of Flutter device. 

## 2017-02-04 NOTE — Anesthesia Postprocedure Evaluation (Signed)
Anesthesia Post Note  Patient: EASHAN SCHIPANI  Procedure(s) Performed: ESOPHAGOGASTRODUODENOSCOPY (EGD) WITH PROPOFOL (N/A )     Patient location during evaluation: Endoscopy Anesthesia Type: MAC Level of consciousness: awake and alert, patient cooperative and oriented Pain management: pain level controlled Vital Signs Assessment: post-procedure vital signs reviewed and stable Respiratory status: spontaneous breathing, nonlabored ventilation, respiratory function stable and patient connected to nasal cannula oxygen Cardiovascular status: blood pressure returned to baseline and stable Postop Assessment: no apparent nausea or vomiting Anesthetic complications: no    Last Vitals:  Vitals:   02/04/17 1110 02/04/17 1120  BP: (!) 158/57 (!) 152/70  Pulse: 60 (!) 57  Resp: (!) 24 (!) 22  Temp:    SpO2: 99% 96%    Last Pain:  Vitals:   02/04/17 1102  TempSrc: Oral  PainSc:                  Skyy Mcknight,E. Braniyah Besse

## 2017-02-04 NOTE — Progress Notes (Signed)
PT demonstrates hands on understanding of Flutter device. PT continues to report that he produces mucus / debris more often with out coughing than with.

## 2017-02-04 NOTE — Progress Notes (Signed)
[name] Progress Note  CC:  Abnormal esophagram  [name]:  A little coughing but not much.  Had one episode of mild hypotension overnight.  On 2 Liters O2.  Objective:  Vital signs in last 24 hours: Temp:  [97.6 F (36.4 C)-98 F (36.7 C)] 98 F (36.7 C) (11/13 0456) Pulse Rate:  [59-65] 59 (11/13 0456) Resp:  [18-21] 21 (11/13 0456) BP: (91-158)/(47-72) 128/61 (11/13 0456) SpO2:  [95 %-100 %] 98 % (11/13 0730) Last BM Date: 02/03/17 General:  Alert, Well-developed, in NAD Heart:  Regular rate and rhythm; no murmurs Pulm:  CTAB posteriorly.  No increased WOB. Abdomen:  Soft, non-distended.  BS present.  Non-tender. Extremities:  Without edema. Neurologic:  Alert and oriented x 4;  grossly normal neurologically. Psych:  Alert and cooperative. Normal mood and affect.  Intake/Output from previous day: 11/12 0701 - 11/13 0700 In: 360 [P.O.:60; IV Piggyback:300] Out: 1100 [Urine:1100]  Lab Results: Recent Labs    02/02/17 1826 02/03/17 0237 02/04/17 0245  WBC 12.9* 10.3 9.4  HGB 12.5* 10.7* 9.9*  HCT 36.5* 31.7* 29.8*  PLT 197 170 174   BMET Recent Labs    02/02/17 1826 02/03/17 0237 02/04/17 0245  NA 128* 130* 132*  K 5.1 4.4 4.1  CL 96* 104 104  CO2 21* 20* 20*  GLUCOSE 107* 95 93  BUN 36* 31* 32*  CREATININE 1.68* 1.48* 1.64*  CALCIUM 9.0 8.1* 8.2*   LFT Recent Labs    02/02/17 1826  PROT 7.3  ALBUMIN 3.6  AST 29  ALT 19  ALKPHOS 383*  BILITOT 0.8   Dg Esophagus  Result Date: 02/03/2017 CLINICAL DATA:  Dysphagia. EXAM: ESOPHOGRAM/BARIUM SWALLOW TECHNIQUE: Single contrast examination was performed using  thin barium. FLUOROSCOPY TIME:  Fluoroscopy Time:  2.8 minutes Radiation Exposure Index (if provided by the fluoroscopic device): 46.9 mGy COMPARISON:  None. FINDINGS: The esophagus is slightly tortuous with extensive intermittent spasms with severe spasm just above the gastroesophageal junction. There is extensive to and  fro peristalsis with contrast extending back into the oropharynx repeatedly, even in the semi-erect position. There is occasional relaxation of severe spasm in the distal esophagus. The lumen during relaxation is approximately 6-7 mm in diameter. No visible masses. Because of the severe distal spasm, I did not give the patient a barium tablet. IMPRESSION: 1. Extensive spasm throughout the esophagus with severe spasm in the distal esophagus just above the gastroesophageal junction. This area does intermittently relax to a diameter of approximately 6-7 mm. 2. To and fro peristalsis in the upper esophagus with reflux into the oropharynx repeatedly. Electronically Signed   By: Lorriane Shire M.D.   On: 02/03/2017 15:21   Dg Chest Port 1 View  Result Date: 02/02/2017 CLINICAL DATA:  83 year old male with cough and fever. EXAM: PORTABLE CHEST 1 VIEW COMPARISON:  Chest radiograph dated 02/01/2017 FINDINGS: There is shallow inspiration with bibasilar linear atelectasis/ scarring. Stable calcified granuloma at the right lung base. No focal consolidation, pleural effusion, or pneumothorax. The cardiac silhouette is within normal limits. There is atherosclerotic calcification of the aorta. Left pectoral dual lead AICD device. There is osteopenia with degenerative changes of the spine. No acute osseous pathology. IMPRESSION: No active disease. Electronically Signed   By: Anner Crete M.D.   On: 02/02/2017 19:10   Dg Swallowing Func-speech Pathology  Result Date: 02/03/2017 Objective Swallowing Evaluation: Type of Study: MBS-Modified Barium Swallow Study  Patient Details Name: [name] [name] MRN: [id number] Date  of Birth: November 27, 1922 Today's Date: 02/03/2017 Time: SLP Start Time (ACUTE ONLY): 1355 -SLP Stop Time (ACUTE ONLY): 1420 SLP Time Calculation (min) (ACUTE ONLY): 25 min Past Medical History: Past Medical History: Diagnosis Date . Cancer ([place])   Stomach . CHF (congestive heart failure) ([place])  . Collagen  vascular disease ([place])  . Hypertension  Past Surgical History: Past Surgical History: Procedure Laterality Date . APPENDECTOMY   . COLONOSCOPY  12/1998, 02/2004  diverticulosois, external hemorrhoids . CRANIOTOMY Left  . ESOPHAGOGASTRODUODENOSCOPY  06/17/2014  Dr. Oneida Alar: 1. Patent stricture at the gastroesophageal junction 2. UGIB Due to multiple  gastric ulcers 3. Moderate Duodentitis. Negative H.pylori . heart disease    permanent pacemaker . HIP FRACTURE SURGERY  2011  ORIF  R. . INGUINAL HERNIA REPAIR   . IR GENERIC HISTORICAL  10/26/2015  IR RADIOLOGIST EVAL & MGMT 10/26/2015 [facility]-INTERV RAD . PACEMAKER INSERTION   HPI: pt is a 83 yo male adm to [facility] with UTI/fever.  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  He does report some difficulties after his stomach surgery in March 2016 to treat his cancer.  CXR negative.  Pt admits to progressive problems with swallowing noted recently but denies sudden onset.  He denies ever having swallow evaluation completed previously.   [name]: pt awake in chair Assessment / Plan / Recommendation CHL IP CLINICAL IMPRESSIONS 02/03/2017 Clinical Impression Pt presents with functional oral swallow with pharyngocervical esophageal *suspect primary esophageal* deficits.  He was only tested with a few liquid boluses due to concerns for primary esophageal deficits and need for esophagram.  Premature spillage of boluses into pharynx observed - with swallow triggering at pyriform sinus with thin liquid via tsp.  Trace vallecular/pyriform sinus residual noted with nectar which pt independently dry swallows to clear.  Trace laryngeal penetration of thin before swallow due to decreased timing of laryngeal closure.  Pt did not aspirate but he does demonstrate backflow of liquid barium into pharynx/open larynx.  Upon esophageal sweep, his esophagus appeared full - ? spasms. Trace  laryngeal penetration of thin/nectar backflowed material observed with pt conducting further swallows to help clear.  Suspect he is having some aspiration primarily due to his esophageal deficits.  Esophagram to follow, will follow up.  Educated pt to findings/concerns using teach back.   SLP Visit Diagnosis Dysphagia, pharyngoesophageal phase (R13.14);Dysphagia, unspecified (R13.10) Attention and concentration deficit following -- Frontal lobe and executive function deficit following -- Impact on safety and function Risk for inadequate nutrition/hydration;Moderate aspiration risk   CHL IP TREATMENT RECOMMENDATION 02/03/2017 Treatment Recommendations Therapy as outlined in treatment plan below   Prognosis 02/03/2017 Prognosis for Safe Diet Advancement Guarded Barriers to Reach Goals -- Barriers/Prognosis Comment -- CHL IP DIET RECOMMENDATION 02/03/2017 SLP Diet Recommendations NPO;Ice chips PRN after oral care Liquid Administration via -- Medication Administration -- Compensations -- Postural Changes --   CHL IP OTHER RECOMMENDATIONS 02/03/2017 Recommended Consults Consider esophageal assessment Oral Care Recommendations -- Other Recommendations --   CHL IP FOLLOW UP RECOMMENDATIONS 03/16/2015 Follow up Recommendations None   CHL IP FREQUENCY AND DURATION 02/03/2017 Speech Therapy Frequency (ACUTE ONLY) min 1 x/week Treatment Duration 1 week      CHL IP ORAL PHASE 02/03/2017 Oral Phase Impaired Oral - Pudding Teaspoon -- Oral - Pudding Cup -- Oral - Honey Teaspoon -- Oral - Honey Cup -- Oral - Nectar Teaspoon -- Oral - Nectar Cup -- Oral -  Nectar Straw WFL Oral - Thin Teaspoon WFL Oral - Thin Cup -- Oral - Thin Straw WFL Oral - Puree -- Oral - Mech Soft -- Oral - Regular -- Oral - Multi-Consistency -- Oral - Pill -- Oral Phase - Comment --  CHL IP PHARYNGEAL PHASE 02/03/2017 Pharyngeal Phase WFL Pharyngeal- Pudding Teaspoon -- Pharyngeal -- Pharyngeal- Pudding Cup -- Pharyngeal -- Pharyngeal- Honey Teaspoon --  Pharyngeal -- Pharyngeal- Honey Cup -- Pharyngeal -- Pharyngeal- Nectar Teaspoon -- Pharyngeal -- Pharyngeal- Nectar Cup -- Pharyngeal -- Pharyngeal- Nectar Straw WFL;Delayed swallow initiation-pyriform sinuses;Pharyngeal residue - valleculae;Pharyngeal residue - pyriform Pharyngeal -- Pharyngeal- Thin Teaspoon WFL Pharyngeal -- Pharyngeal- Thin Cup -- Pharyngeal -- Pharyngeal- Thin Straw WFL;Penetration/Apiration after swallow;Penetration/Aspiration before swallow;Reduced airway/laryngeal closure;Reduced laryngeal elevation;Pharyngeal residue - valleculae;Pharyngeal residue - pyriform Pharyngeal Material enters airway, remains ABOVE vocal cords then ejected out;Material enters airway, remains ABOVE vocal cords and not ejected out Pharyngeal- Puree -- Pharyngeal -- Pharyngeal- Mechanical Soft -- Pharyngeal -- Pharyngeal- Regular -- Pharyngeal -- Pharyngeal- Multi-consistency -- Pharyngeal -- Pharyngeal- Pill -- Pharyngeal -- Pharyngeal Comment --  CHL IP CERVICAL ESOPHAGEAL PHASE 02/03/2017 Cervical Esophageal Phase Impaired Pudding Teaspoon -- Pudding Cup -- Honey Teaspoon -- Honey Cup -- Nectar Teaspoon -- Nectar Cup -- Nectar Straw Esophageal backflow into the pharynx;Esophageal backflow into cervical esophagus;Reduced cricopharyngeal relaxation Thin Teaspoon Esophageal backflow into the pharynx;Esophageal backflow into cervical esophagus Thin Cup -- Thin Straw Esophageal backflow into the pharynx;Esophageal backflow into cervical esophagus Puree -- Mechanical Soft -- Regular -- Multi-consistency -- Pill -- Cervical Esophageal Comment ? reduced relaxation of cricopharyngeus and esophageal backflow into pharynx, pt conducts multiple swallows CHL IP GO 02/03/2017 Functional Assessment Tool Used clinical judgement Functional Limitations Swallowing Swallow Current Status (K8127) CM Swallow Goal Status (N1700) CL Swallow Discharge Status (F7494) (None) Motor Speech Current Status (W9675) (None) Motor Speech Goal  Status (F1638) (None) Motor Speech Goal Status (G6659) (None) Spoken Language Comprehension Current Status (D3570) (None) Spoken Language Comprehension Goal Status (V7793) (None) Spoken Language Comprehension Discharge Status [phone]) (None) Spoken Language Expression Current Status [phone]) (None) Spoken Language Expression Goal Status (Q7622) (None) Spoken Language Expression Discharge Status [phone]) (None) Attention Current Status (K5625) (None) Attention Goal Status (W3893) (None) Attention Discharge Status (T3428) (None) Memory Current Status (J6811) (None) Memory Goal Status (X7262) (None) Memory Discharge Status (M3559) (None) Voice Current Status (R4163) (None) Voice Goal Status (A4536) (None) Voice Discharge Status (I6803) (None) Other Speech-Language Pathology Functional Limitation Current Status (O1224) (None) Other Speech-Language Pathology Functional Limitation Goal Status (M2500) (None) Other Speech-Language Pathology Functional Limitation Discharge Status [phone]) (None) Luanna Salk, MS [facility] SLP [place] 02/03/2017, 4:24 PM              Assessment / Plan: 1. 83 yo male admitted with chills / SOB.  Regurgitating copious amounts of phlegm and some undigested pieces of food over last several months.  Barium swallow demonstrates intermittent spasms of the esophagus with severe distal esophageal spasms and repeated reflux into the oropharynx.  Despite findings patient doesn't really give a hx of dysphagia. No chest pain which sometimes accompanies esophageal spasms. He has had some mild weight loss. MBSS suggests primary esophageal problem.  -Will proceed with EGD this AM.  2. Cirrhosis, first identified in 2016 on imaging study. Followed by Dr. Oneida Alar though doesn't look like he has seen her since 2016.  Workup for etiology was negative except for a strongly positive AMA. Alk phos chronically elevated but over the last 1.5 years it has  continued to trend up from 72 to 380.  Based  on outpatient medication list he doesn't appear to be on Urso for cholestasis .   -No evidence for hepatic decompensation at present.  Follow-up with Dr. Oneida Alar.  3. 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 No recurrence on last EGD in 2016.   4. Recent non-displaced C1 ring fx following a fall. Being managed conservatively. Requiring narcotics.  5. Hx of CHF, ICD placement, CKD / polycystic kidneys  6. Mild dementia, on Aricept    LOS: 1 day   Laban Emperor. Zehr  02/04/2017, 9:36 AM  Pager number [phone]  GI ATTENDING  Interval history data reviewed. Patient seen and examined. Agree with above progress note. Patient for EGD as discussed yesterday.  Docia Chuck. Geri [place]in[place]., M.D. [facility] Division of Gastroenterology

## 2017-02-04 NOTE — Progress Notes (Signed)
@IPLOG @        PROGRESS NOTE                                                                                                                                                                                                             Patient Demographics:    [name] [name], is a 84 y.o. male, DOB - [birth year], WEX:[id number]  Admit date - 02/02/2017   Admitting Physician Rise Patience, MD  Outpatient Primary MD for the patient is Plotnikov, Evie Lacks, MD  LOS - 1  Chief Complaint  Patient presents with  . Chills  . Cough       Brief Narrative  [name] [name] is a 83 y.o. male with history of chronic systolic heart failure status post ICD placement, history of sinus node dysfunction status post pacemaker placement, history of B-cell lymphoma in remission, chronic kidney disease stage IV, chronic anemia hypothyroidism cirrhosis of liver, hypertension presents to the ER for the second time in 48 hours for fever and chills.  Patient has come to the ER 2 days ago with dysuria shortness of breath and fever chills.  At that time patient was diagnosed with UTI chest x-ray was unremarkable and patient was discharged back on antibiotics.    He was admitted for URI, mild acute on chronic systolic CHF, also showed signs of dysphagia for which GI has been consulted as initial studies suggest he might have underlying achalasia as well.   Subjective:   Patient in bed, appears comfortable, denies any headache, no fever, no chest pain or pressure, continues to have mild shortness of breath , no abdominal pain. No focal weakness.    Assessment  & Plan :    1.  Fever chills due to combination of UTI and URI.  Responding well to Rocephin, still has productive cough and some shortness of breath, will add azithromycin as well, negative for influenza, check sputum Gram stain culture, 2 view chest x-ray unremarkable.  Added nebulizer treatments, Mucomyst and flutter valve for pulmonary toiletry,  continue to monitor.  2.  Mild acute on chronic systolic CHF EF 93-81%.  Has AICD.  Continues to have a few rales, placed on gentle IV Lasix daily, already on long-acting nitrate, cautious with IV fluids, is under the care of Dr. Crissie Sickles, not on beta-blocker, ACE/ARB due to renal insufficiency and low blood pressure.  Defer any change in long-standing cardiac medications to his cardiologist.  3.  BPH.  On Flomax.  4.  Hypothyroidism.  Synthroid 112 mcg  continued, TSH borderline, repeat TSH in 4-6 weeks.  5.  Mild early dementia.  On Aricept continue, at risk for delirium, minimize narcotics and benzodiazepines.  May require placement.  6.  Dysphasia. Has history of gastric lymphoma in the past, last EGD in 2016 by Dr. Lovey Newcomer feels unremarkable, continue PPI, by speech and underwent an esophagram on 02/03/2017 which was suggestive of possible achalasia, placed on long-acting nitrate, intranasal nitrate before meals, also added calcium channel blocker, GI called, likely EGD later on 02/04/2017.  7.  Dyslipidemia.  On statin continue.  8.  CKD 4.  Baseline creatinine around 1.6.  Continue to monitor.  9.  Anemia of chronic disease.  Stable no acute issues.  10.  Falls, deconditioning.  PT eval.  May require SNF.  11.  B-cell gastric lymphoma.  Under remission.  Follows with Dr. Heath Lark oncologist and Dr. Barney Drain GI.    Diet : Diet NPO time specified    Family Communication  :  None  Code Status :  Full  Disposition Plan  :  TBD  Consults  : GI  Procedures  :    Esophagogram showing changes suggestive of achalasia or esophageal spasm.  EGD due 02/04/2017  DVT Prophylaxis  :  Lovenox   Lab Results  Component Value Date   PLT 174 02/04/2017    Inpatient Medications  Scheduled Meds: . [MAR Hold] atorvastatin  10 mg Oral BID  . [MAR Hold] calcium carbonate  500 mg of elemental calcium Oral BID WC  . [MAR Hold] cholecalciferol  2,000 Units Oral Daily  . [MAR  Hold] donepezil  10 mg Oral QHS  . [MAR Hold] ipratropium-albuterol  3 mL Nebulization BID  . [MAR Hold] isosorbide dinitrate  5 mg Oral TID  . [MAR Hold] levothyroxine  112 mcg Oral QAC breakfast  . [MAR Hold] multivitamin with minerals  1 tablet Oral Daily  . [MAR Hold] NIFEdipine  10 mg Oral TID AC  . [MAR Hold] nitroGLYCERIN  1 spray Sublingual TID AC  . [MAR Hold] pantoprazole  40 mg Oral Daily  . [MAR Hold] tamsulosin  0.4 mg Oral QPM  . [MAR Hold] triamcinolone  2 spray Nasal Daily  . [MAR Hold] triamcinolone ointment  1 application Topical BID   Continuous Infusions: . [MAR Hold] azithromycin Stopped (02/03/17 1640)  . [MAR Hold] cefTRIAXone (ROCEPHIN)  IV Stopped (02/03/17 2159)   PRN Meds:.[MAR Hold] acetaminophen **OR** [DISCONTINUED] acetaminophen, [MAR Hold] albuterol, [MAR Hold] docusate sodium, [MAR Hold] fentaNYL (SUBLIMAZE) injection, [MAR Hold] hydrALAZINE, [MAR Hold] nitroGLYCERIN, [DISCONTINUED] ondansetron **OR** [MAR Hold] ondansetron (ZOFRAN) IV  Antibiotics  :    Anti-infectives (From admission, onward)   Start     Dose/Rate Route Frequency Ordered Stop   02/03/17 2000  [MAR Hold]  cefTRIAXone (ROCEPHIN) 1 g in dextrose 5 % 50 mL IVPB     (MAR Hold since 02/04/17 0958)   1 g 100 mL/hr over 30 Minutes Intravenous Every 24 hours 02/03/17 1104     02/03/17 1400  [MAR Hold]  azithromycin (ZITHROMAX) 500 mg in dextrose 5 % 250 mL IVPB     (MAR Hold since 02/04/17 0958)   500 mg 250 mL/hr over 60 Minutes Intravenous Every 24 hours 02/03/17 1239     02/02/17 2030  cefTRIAXone (ROCEPHIN) 1 g in dextrose 5 % 50 mL IVPB     1 g 100 mL/hr over 30 Minutes Intravenous  Once 02/02/17 2019 02/02/17 2117  Objective:   Vitals:   02/04/17 1000 02/04/17 1003 02/04/17 1010 02/04/17 1020  BP:  (!) 185/66 (!) 161/92 (!) 177/62  Pulse: 66 62 61 81  Resp: 20 17 (!) 21 (!) 27  Temp:  97.7 F (36.5 C)    TempSrc:  Oral    SpO2: 95% 95% 95% 95%  Weight:  67.7 kg (149  lb 4 oz)    Height:  5\' 4"  (1.626 m)      Wt Readings from Last 3 Encounters:  02/04/17 67.7 kg (149 lb 4 oz)  01/09/17 66.7 kg (147 lb)  10/25/16 68.9 kg (152 lb)     Intake/Output Summary (Last 24 hours) at 02/04/2017 1028 Last data filed at 02/04/2017 0600 Gross per 24 hour  Intake 360 ml  Output 400 ml  Net -40 ml     Physical Exam  Awake Alert,  No new F.N deficits, Normal affect [place].AT,PERRAL Supple Neck,No JVD, No cervical lymphadenopathy appriciated.  Symmetrical Chest wall movement, Good air movement bilaterally, no basilar crackles, some scattered coarse breath sounds RRR,No Gallops, Rubs or new Murmurs, No Parasternal Heave +ve B.Sounds, Abd Soft, No tenderness, No organomegaly appriciated, No rebound - guarding or rigidity. No Cyanosis, Clubbing or edema, No new Rash or bruise     Data Review:    CBC Recent Labs  Lab 02/01/17 1231 02/02/17 1826 02/03/17 0237 02/04/17 0245  WBC 9.6 12.9* 10.3 9.4  HGB 11.2* 12.5* 10.7* 9.9*  HCT 33.0* 36.5* 31.7* 29.8*  PLT 193 197 170 174  MCV 92.2 92.9 93.5 93.7  MCH 31.3 31.8 31.6 31.1  MCHC 33.9 34.2 33.8 33.2  RDW 15.2 15.3 15.2 15.1  LYMPHSABS  --  1.4  --   --   MONOABS  --  2.0*  --   --   EOSABS  --  0.5  --   --   BASOSABS  --  0.0  --   --     Chemistries  Recent Labs  Lab 02/01/17 1231 02/02/17 1826 02/03/17 0237 02/04/17 0245  NA 130* 128* 130* 132*  K 4.3 5.1 4.4 4.1  CL 97* 96* 104 104  CO2 21* 21* 20* 20*  GLUCOSE 111* 107* 95 93  BUN 38* 36* 31* 32*  CREATININE 1.89* 1.68* 1.48* 1.64*  CALCIUM 8.7* 9.0 8.1* 8.2*  MG  --   --   --  1.6*  AST  --  29  --   --   ALT  --  19  --   --   ALKPHOS  --  383*  --   --   BILITOT  --  0.8  --   --    ------------------------------------------------------------------------------------------------------------------ No results for input(s): CHOL, HDL, LDLCALC, TRIG, CHOLHDL, LDLDIRECT in the last 72 hours.  No results found for:  HGBA1C ------------------------------------------------------------------------------------------------------------------ Recent Labs    02/03/17 0815  TSH 4.962*   ------------------------------------------------------------------------------------------------------------------ No results for input(s): VITAMINB12, FOLATE, FERRITIN, TIBC, IRON, RETICCTPCT in the last 72 hours.  Coagulation profile No results for input(s): INR, PROTIME in the last 168 hours.  No results for input(s): DDIMER in the last 72 hours.  Cardiac Enzymes Recent Labs  Lab 02/03/17 0237 02/03/17 0815  TROPONINI <0.03 <0.03   ------------------------------------------------------------------------------------------------------------------    Component Value Date/Time   BNP 355.0 (H) 10/26/2014 1003    Micro Results Recent Results (from the past 240 hour(s))  Urine culture     Status: None (Preliminary result)   Collection Time: 02/02/17  6:28  PM  Result Value Ref Range Status   Specimen Description URINE, RANDOM  Final   Special Requests NONE  Final   Culture   Final    CULTURE REINCUBATED FOR BETTER GROWTH Performed at [facility], [place] [address] [place], [place] [address]    Report Status PENDING  Incomplete  Blood culture (routine x 2)     Status: None (Preliminary result)   Collection Time: 02/02/17  7:27 PM  Result Value Ref Range Status   Specimen Description BLOOD LEFT FOREARM  Final   Special Requests   Final    BOTTLES DRAWN AEROBIC AND ANAEROBIC Blood Culture adequate volume   Culture   Final    NO GROWTH 1 DAY Performed at [facility], [place] [address] [place], [place] [address]    Report Status PENDING  Incomplete  Blood culture (routine x 2)     Status: None (Preliminary result)   Collection Time: 02/02/17  8:40 PM  Result Value Ref Range Status   Specimen Description BLOOD RIGHT HAND  Final   Special Requests   Final    BOTTLES DRAWN AEROBIC AND ANAEROBIC Blood  Culture adequate volume   Culture   Final    NO GROWTH 1 DAY Performed at [facility], [place] [address], [place], [place] [address]    Report Status PENDING  Incomplete  MRSA PCR Screening     Status: None   Collection Time: 02/03/17  5:14 AM  Result Value Ref Range Status   MRSA by PCR NEGATIVE NEGATIVE Final    Comment:        The GeneXpert MRSA Assay (FDA approved for NASAL specimens only), is one component of a comprehensive MRSA colonization surveillance program. It is not intended to diagnose MRSA infection nor to guide or monitor treatment for MRSA infections.   Culture, expectorated sputum-assessment     Status: None   Collection Time: 02/03/17  9:30 AM  Result Value Ref Range Status   Specimen Description EXPECTORATED SPUTUM  Final   Special Requests NONE  Final   Sputum evaluation   Final    Sputum specimen not acceptable for testing.  Please recollect.   INFORMED DUNKLEBERGER,C. RN @1147  ON 11.12.18 BY [email or web address]    Report Status 02/03/2017 FINAL  Final    Radiology Reports Dg Chest 2 View  Result Date: 02/01/2017 CLINICAL DATA:  Increasingly short of breath since fall 3 weeks ago. EXAM: CHEST  2 VIEW COMPARISON:  05/15/2015 FINDINGS: The heart remains normal in size. Aorta rib is tortuous. Mediastinum is rotated to the right limiting evaluation of the mediastinum. Lungs are under aerated. Chronic interstitial changes. Calcified granulomata at the right lung base. Stable thoracic spine. Osteopenia. No pneumothorax or pleural effusion. AICD device is unchanged via left subclavian vein. Chronic sternal deformity. IMPRESSION: No active cardiopulmonary disease. Electronically Signed   By: Marybelle Killings M.D.   On: 02/01/2017 11:54   Dg Ribs Unilateral Left  Result Date: 02/01/2017 CLINICAL DATA:  Increasing SOB since fall 3 weeks ago; left lower rib pain x 3 weeks; hx gastric CA, CHF, HTN; ex smoker quit 40 years ago; EXAM: LEFT RIBS - 2 VIEW COMPARISON:  Current chest  radiograph. Previous chest radiograph, 05/15/2015. FINDINGS: No rib fracture or rib lesion.  Bony thorax is demineralized. No left lung consolidation. No pleural effusion or evidence of a pneumothorax. IMPRESSION: No rib fracture or rib lesion. Electronically Signed   By: Dedra Skeens.D.  On: 02/01/2017 11:50    Dg Chest Port 1 View  Result Date: 02/02/2017 CLINICAL DATA:  83 year old male with cough and fever. EXAM: PORTABLE CHEST 1 VIEW COMPARISON:  Chest radiograph dated 02/01/2017 FINDINGS: There is shallow inspiration with bibasilar linear atelectasis/ scarring. Stable calcified granuloma at the right lung base. No focal consolidation, pleural effusion, or pneumothorax. The cardiac silhouette is within normal limits. There is atherosclerotic calcification of the aorta. Left pectoral dual lead AICD device. There is osteopenia with degenerative changes of the spine. No acute osseous pathology. IMPRESSION: No active disease. Electronically Signed   By: Anner Crete M.D.   On: 02/02/2017 19:10    Time Spent in minutes  30   Lala Lund M.D on 02/04/2017 at 10:28 AM  Between 7am to 7pm - Pager - [phone] ( page via [email or web address], text pages only, please mention full 10 digit call back number). After 7pm go to [email or web address] - password [email or web address]

## 2017-02-04 NOTE — Plan of Care (Signed)
  Clinical Measurements: Respiratory complications will improve 02/04/2017 2211 - Progressing by Ashley Murrain, RN    Education: Knowledge of General Education information will improve 02/04/2017 2211 - Progressing by Ashley Murrain, RN

## 2017-02-04 NOTE — Progress Notes (Signed)
PT demonstrated hands on understanding of Flutter device. PT continues to state that he produces mucus / debris without coughing. Appears PT produces mucus / debris during Flutter utilization without coughing.

## 2017-02-04 NOTE — Op Note (Signed)
[facility] Patient Name: [name] [name] Procedure Date: 02/04/2017 MRN: [id number] Attending MD: Docia Chuck. Henrene Pastor , MD Date of Birth: 01-10-1923 CSN: [id number] Age: 83 Admit Type: Inpatient Procedure:                Upper GI endoscopy Indications:              Dysphagia, Abnormal cine-esophagram Providers:                Docia Chuck. Henrene Pastor, MD, Burtis Junes, RN, Janie Billups,                            Technician, Courtney Heys. [place], CRNA Referring MD:              Medicines:                Monitored Anesthesia Care Complications:            No immediate complications. Estimated Blood Loss:     Estimated blood loss: none. Procedure:                Pre-Anesthesia Assessment:                           - Prior to the procedure, a History and Physical                            was performed, and patient medications and                            allergies were reviewed. The patient's tolerance of                            previous anesthesia was also reviewed. The risks                            and benefits of the procedure and the sedation                            options and risks were discussed with the patient.                            All questions were answered, and informed consent                            was obtained. Prior Anticoagulants: The patient has                            taken no previous anticoagulant or antiplatelet                            agents. ASA Grade Assessment: III - A patient with                            severe systemic disease. After reviewing the risks  and benefits, the patient was deemed in                            satisfactory condition to undergo the procedure.                           After obtaining informed consent, the endoscope was                            passed under direct vision. Throughout the                            procedure, the patient's blood pressure, pulse, and             oxygen saturations were monitored continuously. The                            [id number]) scope was introduced through the                            mouth, and advanced to the second part of duodenum.                            The upper GI endoscopy was accomplished without                            difficulty. The patient tolerated the procedure                            well. Scope In: Scope Out: Findings:      The esophagus was somewhat dilated and tortuous. There was no       significant stricture at the gastroesophageal junction. The esophagus       was otherwise normal.      The stomach was normal. Retroflex views did not reveal any mass or other       abnormality in the region of the gastroesophageal junction.      The examined duodenum was normal.      The cardia and gastric fundus were normal on retroflexion. Impression:               1. Dilated somewhat tortuous esophagus. Otherwise                            normal. No significant stricture. No tumor                           2. Otherwise normal exam. No evidence for gastric                            tumor. Moderate Sedation:      none Recommendation:           1. Soft diet                           2. Schedule esophageal manometry to rule out  achalasia                           3. Continue present medications.                           4. Ongoing management of multiple medical problems                            per primary medical service Procedure Code(s):        --- Professional ---                           [phone], Esophagogastroduodenoscopy, flexible,                            transoral; diagnostic, including collection of                            specimen(s) by brushing or washing, when performed                            (separate procedure) Diagnosis Code(s):        --- Professional ---                           R13.10, Dysphagia, unspecified                            R93.3, Abnormal findings on diagnostic imaging of                            other parts of digestive tract CPT copyright 2016 [employer]. All rights reserved. The codes documented in this report are preliminary and upon coder review may  be revised to meet current compliance requirements. Docia Chuck. Henrene Pastor, MD 02/04/2017 11:11:39 AM This report has been signed electronically. Number of Addenda: 0

## 2017-02-04 NOTE — Anesthesia Preprocedure Evaluation (Addendum)
Anesthesia Evaluation  Patient identified by MRN, date of birth, ID band Patient awake    Reviewed: Allergy & Precautions, NPO status , Patient's Chart, lab work & pertinent test results  History of Anesthesia Complications Negative for: history of anesthetic complications  Airway Mallampati: II  TM Distance: >3 FB Neck ROM: Full    Dental  (+) Dental Advisory Given   Pulmonary COPD,  COPD inhaler and oxygen dependent, former smoker,    breath sounds clear to auscultation       Cardiovascular hypertension, Pt. on medications (-) angina+ pacemaker + Cardiac Defibrillator (has never shocked pt)  Rhythm:Regular Rate:Normal  '16 ECHO: 30% to 35%. Diffuse hypokinesis   Neuro/Psych negative neurological ROS     GI/Hepatic GERD  Medicated and Controlled,(+) Cirrhosis       ,   Endo/Other  Hypothyroidism   Renal/GU Renal InsufficiencyRenal disease (creat 1.64)     Musculoskeletal   Abdominal   Peds  Hematology  (+) Blood dyscrasia (Hb 9.9), ,   Anesthesia Other Findings   Reproductive/Obstetrics                            Anesthesia Physical Anesthesia Plan  ASA: IV  Anesthesia Plan: MAC   Post-op Pain Management:    Induction: Intravenous  PONV Risk Score and Plan: Treatment may vary due to age or medical condition and Ondansetron  Airway Management Planned: Natural Airway and Nasal Cannula  Additional Equipment:   Intra-op Plan:   Post-operative Plan:   Informed Consent: I have reviewed the patients History and Physical, chart, labs and discussed the procedure including the risks, benefits and alternatives for the proposed anesthesia with the patient or authorized representative who has indicated his/her understanding and acceptance.   Dental advisory given  Plan Discussed with:   Anesthesia Plan Comments: (Plan routine monitors, MAC)        Anesthesia Quick  Evaluation

## 2017-02-04 NOTE — Progress Notes (Signed)
PT Cancellation Note  Patient Details Name: [name] [name] MRN: [id number] DOB: 1922/09/01   Cancelled Treatment:    Reason Eval/Treat Not Completed: Patient at procedure or test/unavailable   [facility] 02/04/2017, 10:07 AM

## 2017-02-05 ENCOUNTER — Ambulatory Visit (HOSPITAL_COMMUNITY): Admission: RE | Admit: 2017-02-05 | Payer: Medicare Other | Source: Ambulatory Visit | Admitting: Internal Medicine

## 2017-02-05 ENCOUNTER — Encounter (HOSPITAL_COMMUNITY)
Admission: EM | Disposition: A | Discharge status: Home Health | Payer: Self-pay | Source: Home / Self Care | Attending: Family Medicine

## 2017-02-05 DIAGNOSIS — Z95810 Presence of automatic (implantable) cardiac defibrillator: Secondary | ICD-10-CM

## 2017-02-05 DIAGNOSIS — E034 Atrophy of thyroid (acquired): Secondary | ICD-10-CM

## 2017-02-05 HISTORY — PX: ESOPHAGEAL MANOMETRY: SHX5429

## 2017-02-05 LAB — BASIC METABOLIC PANEL
ANION GAP: 9 (ref 5–15)
BUN: 29 mg/dL — ABNORMAL HIGH (ref 6–20)
CO2: 22 mmol/L (ref 22–32)
Calcium: 8.9 mg/dL (ref 8.9–10.3)
Chloride: 106 mmol/L (ref 101–111)
Creatinine, Ser: 1.56 mg/dL — ABNORMAL HIGH (ref 0.61–1.24)
GFR calc Af Amer: 43 mL/min — ABNORMAL LOW (ref >60)
GFR, EST NON AFRICAN AMERICAN: 37 mL/min — AB (ref >60)
GLUCOSE: 82 mg/dL (ref 65–99)
POTASSIUM: 4.2 mmol/L (ref 3.5–5.1)
SODIUM: 137 mmol/L (ref 135–145)

## 2017-02-05 LAB — URINE CULTURE

## 2017-02-05 LAB — GLUCOSE, CAPILLARY
GLUCOSE-CAPILLARY: 78 mg/dL (ref 65–99)
GLUCOSE-CAPILLARY: 93 mg/dL (ref 65–99)
Glucose-Capillary: 76 mg/dL (ref 65–99)

## 2017-02-05 LAB — CBC
HCT: 33.7 % — ABNORMAL LOW (ref 39.0–52.0)
Hemoglobin: 11.3 g/dL — ABNORMAL LOW (ref 13.0–17.0)
MCH: 31.3 pg (ref 26.0–34.0)
MCHC: 33.5 g/dL (ref 30.0–36.0)
MCV: 93.4 fL (ref 78.0–100.0)
PLATELETS: 195 10*3/uL (ref 150–400)
RBC: 3.61 MIL/uL — AB (ref 4.22–5.81)
RDW: 15.2 % (ref 11.5–15.5)
WBC: 10.6 10*3/uL — AB (ref 4.0–10.5)

## 2017-02-05 SURGERY — MANOMETRY, ESOPHAGUS
Anesthesia: LOCAL

## 2017-02-05 SURGICAL SUPPLY — 2 items
FACESHIELD LNG OPTICON STERILE (SAFETY) IMPLANT
GLOVE BIO SURGEON STRL SZ8 (GLOVE) ×4 IMPLANT

## 2017-02-05 NOTE — Progress Notes (Signed)
@IPLOG @        PROGRESS NOTE                                                                                                                                                                                                             Patient Demographics:    [name] [name], is a 83 y.o. male, DOB - 01-04-1923, NKN:[id number]  Admit date - 02/02/2017   Admitting Physician Rise Patience, MD  Outpatient Primary MD for the patient is Plotnikov, Evie Lacks, MD  LOS - 2  Chief Complaint  Patient presents with  . Chills  . Cough       Brief Narrative  [name] [name] is a 83 y.o. male with history of chronic systolic heart failure status post ICD placement, history of sinus node dysfunction status post pacemaker placement, history of B-cell lymphoma in remission, chronic kidney disease stage IV, chronic anemia hypothyroidism cirrhosis of liver, hypertension presents to the ER for the second time in 48 hours for fever and chills.  Patient has come to the ER 2 days ago with dysuria shortness of breath and fever chills.  At that time patient was diagnosed with UTI chest x-ray was unremarkable and patient was discharged back on antibiotics.    He was admitted for URI, mild acute on chronic systolic CHF, also showed signs of dysphagia for which GI has been consulted as initial studies suggest he might have underlying achalasia as well.   Subjective:   Sleeping quietly with family in room. No events overnight, had trouble with manometry probe this morning. Has not been able to eat much today. Without complaints currently.     Assessment  & Plan :   Staphylococcus lugdunensis UTI: - Continue ceftriaxone based on pan-sensitivity on culture, including oxacillin sensitivity.   Fever, chills, cough: Presumptive diagnosis of early pneumonia without CXR infiltrate ?atypical.  - Continue ceftriaxone as above and added azithromycin.  - Continue bronchodilators, mucomyst, flutter  valve  Reflux/regurgitation: Suspected esophageal dysmotility with negative EGD. Severe distal esophageal spasms and oropharyngeal reflux on barium swallow. - Appreciate GI recommendations: Esophageal manometry testing was incomplete, and results pending. - Continue SLP evaluation, soft diet - Continue PPI - Was started on nicardipine TIDAC, though I would recommend caution with this given LV dysfunction.   Mild acute on chronic systolic CHF EF 37-90%.  Has AICD, followed by Dr. Lovena Le. - Appears euvolemic after IV lasix. Will hold diuretics and fluids for now.  - Continue long-acting nitrate. Not on BB or ACE/ARB due  to hypotension.   Idiopathic hepatic cirrhosis: Compensated - Follow up with Dr. Oneida Alar as outpatient.  Frequent falls with non-displaced C1 ring fracture:  - Conservative management, requiring opioids for pain control.   BPH:  - Continue flomax.  Hypothyroidism.   - Synthroid 112 mcg continued - TSH borderline elevated at 4.962, repeat TSH in 4-6 weeks.  Mild dementia:  - Continue aricept - Delirium precautions reviewed with pt and family.  - PT/OT evaluation.   History of gastric lymphoma: In remission, followed by Dr. Alvy Bimler. No masses on EGD 11/13.   Dyslipidemia:  - Continue statin  Stage IV CKD: Baseline SCr ~1.6.  - Monitor  Anemia of chronic disease: - Stable no acute issues.  Diet : DIET SOFT Room service appropriate? Yes; Fluid consistency: Thin    Family Communication  :  None  Code Status :  Full  Disposition Plan  :  To SNF (pending PT recommendations) once able to tolerate po.   Consults  : GI  Procedures  :    Esophagogram showing changes suggestive of achalasia or esophageal spasm.  EGD 02/04/2017  DVT Prophylaxis  :  Lovenox   Lab Results  Component Value Date   PLT 195 02/05/2017    Inpatient Medications  Scheduled Meds: . atorvastatin  10 mg Oral BID  . calcium carbonate  500 mg of elemental calcium Oral BID WC  .  cholecalciferol  2,000 Units Oral Daily  . donepezil  10 mg Oral QHS  . ipratropium-albuterol  3 mL Nebulization BID  . isosorbide dinitrate  5 mg Oral TID  . levothyroxine  112 mcg Oral QAC breakfast  . multivitamin with minerals  1 tablet Oral Daily  . NIFEdipine  10 mg Oral TID AC  . nitroGLYCERIN  1 spray Sublingual TID AC  . pantoprazole  40 mg Oral Daily  . tamsulosin  0.4 mg Oral QPM  . triamcinolone  2 spray Nasal Daily  . triamcinolone ointment  1 application Topical BID   Continuous Infusions: . azithromycin Stopped (02/04/17 1535)  . cefTRIAXone (ROCEPHIN)  IV Stopped (02/04/17 2214)   PRN Meds:.acetaminophen **OR** [DISCONTINUED] acetaminophen, albuterol, docusate sodium, fentaNYL (SUBLIMAZE) injection, hydrALAZINE, nitroGLYCERIN, [DISCONTINUED] ondansetron **OR** ondansetron (ZOFRAN) IV  Antibiotics  :    Anti-infectives (From admission, onward)   Start     Dose/Rate Route Frequency Ordered Stop   02/03/17 2000  cefTRIAXone (ROCEPHIN) 1 g in dextrose 5 % 50 mL IVPB     1 g 100 mL/hr over 30 Minutes Intravenous Every 24 hours 02/03/17 1104     02/03/17 1400  azithromycin (ZITHROMAX) 500 mg in dextrose 5 % 250 mL IVPB     500 mg 250 mL/hr over 60 Minutes Intravenous Every 24 hours 02/03/17 1239     02/02/17 2030  cefTRIAXone (ROCEPHIN) 1 g in dextrose 5 % 50 mL IVPB     1 g 100 mL/hr over 30 Minutes Intravenous  Once 02/02/17 2019 02/02/17 2117         Objective:   Vitals:   02/05/17 0755 02/05/17 0911 02/05/17 1032 02/05/17 1352  BP:   (!) 180/75 (!) 100/47  Pulse:   66 63  Resp:   20 20  Temp:   (!) 97.5 F (36.4 C) 99.5 F (37.5 C)  TempSrc:   Oral Axillary  SpO2: 96%  100% 96%  Weight:      Height:  5\' 4"  (1.626 m)      BP (!) 100/47 (BP Location: Left  Arm)   Pulse 63   Temp 99.5 F (37.5 C) (Axillary)   Resp 20   Ht 5\' 4"  (1.626 m)   Wt 67.7 kg (149 lb 4 oz)   SpO2 96%   BMI 25.62 kg/m  Gen: Pleasant elderly male in NAD HEENT: MMM,  posterior oropharynx clear Pulm: Non-labored, transmitted upper airway sounds without wheezes or crackles at bases.  CV: Regular rate, no murmur appreciated; distal pulses intact/symmetric GI: + BS; soft, non-tender, non-distended Skin: No rashes, wounds, ulcers Neuro: A&Ox3, CN II-XII without deficits   Data Review:    CBC Recent Labs  Lab 02/01/17 1231 02/02/17 1826 02/03/17 0237 02/04/17 0245 02/05/17 0452  WBC 9.6 12.9* 10.3 9.4 10.6*  HGB 11.2* 12.5* 10.7* 9.9* 11.3*  HCT 33.0* 36.5* 31.7* 29.8* 33.7*  PLT 193 197 170 174 195  MCV 92.2 92.9 93.5 93.7 93.4  MCH 31.3 31.8 31.6 31.1 31.3  MCHC 33.9 34.2 33.8 33.2 33.5  RDW 15.2 15.3 15.2 15.1 15.2  LYMPHSABS  --  1.4  --   --   --   MONOABS  --  2.0*  --   --   --   EOSABS  --  0.5  --   --   --   BASOSABS  --  0.0  --   --   --     Chemistries  Recent Labs  Lab 02/01/17 1231 02/02/17 1826 02/03/17 0237 02/04/17 0245 02/05/17 0452  NA 130* 128* 130* 132* 137  K 4.3 5.1 4.4 4.1 4.2  CL 97* 96* 104 104 106  CO2 21* 21* 20* 20* 22  GLUCOSE 111* 107* 95 93 82  BUN 38* 36* 31* 32* 29*  CREATININE 1.89* 1.68* 1.48* 1.64* 1.56*  CALCIUM 8.7* 9.0 8.1* 8.2* 8.9  MG  --   --   --  1.6*  --   AST  --  29  --   --   --   ALT  --  19  --   --   --   ALKPHOS  --  383*  --   --   --   BILITOT  --  0.8  --   --   --    ------------------------------------------------------------------------------------------------------------------ No results for input(s): CHOL, HDL, LDLCALC, TRIG, CHOLHDL, LDLDIRECT in the last 72 hours.  No results found for: HGBA1C ------------------------------------------------------------------------------------------------------------------ Recent Labs    02/03/17 0815  TSH 4.962*   ------------------------------------------------------------------------------------------------------------------ No results for input(s): VITAMINB12, FOLATE, FERRITIN, TIBC, IRON, RETICCTPCT in the last 72  hours.  Coagulation profile No results for input(s): INR, PROTIME in the last 168 hours.  No results for input(s): DDIMER in the last 72 hours.  Cardiac Enzymes Recent Labs  Lab 02/03/17 0237 02/03/17 0815  TROPONINI <0.03 <0.03   ------------------------------------------------------------------------------------------------------------------    Component Value Date/Time   BNP 355.0 (H) 10/26/2014 1003    Micro Results Recent Results (from the past 240 hour(s))  Urine culture     Status: Abnormal   Collection Time: 02/02/17  6:28 PM  Result Value Ref Range Status   Specimen Description URINE, RANDOM  Final   Special Requests NONE  Final   Culture 60,000 COLONIES/mL STAPHYLOCOCCUS LUGDUNENSIS (A)  Final   Report Status 02/05/2017 FINAL  Final   Organism ID, Bacteria STAPHYLOCOCCUS LUGDUNENSIS (A)  Final      Susceptibility   Staphylococcus lugdunensis - MIC*    CIPROFLOXACIN <=0.5 SENSITIVE Sensitive     GENTAMICIN <=0.5 SENSITIVE Sensitive  NITROFURANTOIN <=16 SENSITIVE Sensitive     OXACILLIN 1 SENSITIVE Sensitive     TETRACYCLINE <=1 SENSITIVE Sensitive     VANCOMYCIN <=0.5 SENSITIVE Sensitive     TRIMETH/SULFA <=10 SENSITIVE Sensitive     CLINDAMYCIN <=0.25 SENSITIVE Sensitive     RIFAMPIN <=0.5 SENSITIVE Sensitive     Inducible Clindamycin NEGATIVE Sensitive     * 60,000 COLONIES/mL STAPHYLOCOCCUS LUGDUNENSIS  Blood culture (routine x 2)     Status: None (Preliminary result)   Collection Time: 02/02/17  7:27 PM  Result Value Ref Range Status   Specimen Description BLOOD LEFT FOREARM  Final   Special Requests   Final    BOTTLES DRAWN AEROBIC AND ANAEROBIC Blood Culture adequate volume   Culture   Final    NO GROWTH 2 DAYS Performed at [facility], [place] [address], [place], [place] [address]    Report Status PENDING  Incomplete  Blood culture (routine x 2)     Status: None (Preliminary result)   Collection Time: 02/02/17  8:40 PM  Result Value Ref  Range Status   Specimen Description BLOOD RIGHT HAND  Final   Special Requests   Final    BOTTLES DRAWN AEROBIC AND ANAEROBIC Blood Culture adequate volume   Culture   Final    NO GROWTH 2 DAYS Performed at [facility], [place] [address], [place], [place] [address]    Report Status PENDING  Incomplete  MRSA PCR Screening     Status: None   Collection Time: 02/03/17  5:14 AM  Result Value Ref Range Status   MRSA by PCR NEGATIVE NEGATIVE Final    Comment:        The GeneXpert MRSA Assay (FDA approved for NASAL specimens only), is one component of a comprehensive MRSA colonization surveillance program. It is not intended to diagnose MRSA infection nor to guide or monitor treatment for MRSA infections.   Culture, expectorated sputum-assessment     Status: None   Collection Time: 02/03/17  9:30 AM  Result Value Ref Range Status   Specimen Description EXPECTORATED SPUTUM  Final   Special Requests NONE  Final   Sputum evaluation   Final    Sputum specimen not acceptable for testing.  Please recollect.   INFORMED DUNKLEBERGER,C. RN @1147  ON 11.12.18 BY NMCCOY    Report Status 02/03/2017 FINAL  Final    Radiology Reports Dg Chest 2 View  Result Date: 02/01/2017 CLINICAL DATA:  Increasingly short of breath since fall 3 weeks ago. EXAM: CHEST  2 VIEW COMPARISON:  05/15/2015 FINDINGS: The heart remains normal in size. Aorta rib is tortuous. Mediastinum is rotated to the right limiting evaluation of the mediastinum. Lungs are under aerated. Chronic interstitial changes. Calcified granulomata at the right lung base. Stable thoracic spine. Osteopenia. No pneumothorax or pleural effusion. AICD device is unchanged via left subclavian vein. Chronic sternal deformity. IMPRESSION: No active cardiopulmonary disease. Electronically Signed   By: Marybelle Killings M.D.   On: 02/01/2017 11:54   Dg Ribs Unilateral Left  Result Date: 02/01/2017 CLINICAL DATA:  Increasing SOB since fall 3 weeks ago;  left lower rib pain x 3 weeks; hx gastric CA, CHF, HTN; ex smoker quit 40 years ago; EXAM: LEFT RIBS - 2 VIEW COMPARISON:  Current chest radiograph. Previous chest radiograph, 05/15/2015. FINDINGS: No rib fracture or rib lesion.  Bony thorax is demineralized. No left lung consolidation. No pleural effusion or evidence of a pneumothorax. IMPRESSION: No rib fracture or rib  lesion. Electronically Signed   By: Lajean Manes M.D.   On: 02/01/2017 11:50    Dg Chest Port 1 View  Result Date: 02/02/2017 CLINICAL DATA:  83 year old male with cough and fever. EXAM: PORTABLE CHEST 1 VIEW COMPARISON:  Chest radiograph dated 02/01/2017 FINDINGS: There is shallow inspiration with bibasilar linear atelectasis/ scarring. Stable calcified granuloma at the right lung base. No focal consolidation, pleural effusion, or pneumothorax. The cardiac silhouette is within normal limits. There is atherosclerotic calcification of the aorta. Left pectoral dual lead AICD device. There is osteopenia with degenerative changes of the spine. No acute osseous pathology. IMPRESSION: No active disease. Electronically Signed   By: Anner Crete M.D.   On: 02/02/2017 19:10    Time Spent in minutes  30   Vance Gather M.D on 02/05/2017 at 2:41 PM  Between 7am to 7pm - Pager - [phone] ( page via [email or web address], text pages only, please mention full 10 digit call back number). After 7pm go to [email or web address] - password [email or web address]

## 2017-02-05 NOTE — Evaluation (Addendum)
Physical Therapy Evaluation Patient Details Name: [name] [name] MRN: [id number] DOB: 28-Dec-1922 Today's Date: 02/05/2017   History of Present Illness  83 yo male admitted with UTI. Recent fall at home resulting in compression fxs per family and chart. Hx of fall, R hip fx-s/p ORIF, OA, hearing loss, Sz, lymphoma, CKD, pacemaker, CHF. Pt is from an Ind Living facility    Clinical Impression  On eval, pt required Min-Mod assist for mobility. He walked ~50 feet with a RW. Pt presents with general weakness, decreased activity tolerance, and impaired gait and balance.O2 sat 92% on Ra at rest, 87% on RA during ambulation. He c/o some back pain during session. Discussed d/c plan with pt/family-plan is for home. Daughter stated they can arrange help as needed. At this time recommendation is for 24 hour supervision/assist and HHPT. Will follow and progress activity as tolerated. If 24 hour care not available, may need to consider SNF placement if pt and family are agreeable.    Follow Up Recommendations Home health PT;Supervision/Assistance - 24 hour    Equipment Recommendations  None recommended by PT    Recommendations for Other Services       Precautions / Restrictions Precautions Precautions: Fall Restrictions Weight Bearing Restrictions: No      Mobility  Bed Mobility Overal bed mobility: Needs Assistance Bed Mobility: Supine to Sit;Sit to Supine     Supine to sit: Min assist;HOB elevated Sit to supine: Mod assist;HOB elevated   General bed mobility comments: Assist for trunk and LEs. Increased time. Utilized bedpad to help with scooting. At baseline, pt uses a trapeze  Transfers Overall transfer level: Needs assistance Equipment used: Rolling walker (2 wheeled) Transfers: Sit to/from Stand Sit to Stand: From elevated surface;Min assist         General transfer comment: Small amount of assist to rise, steady, control descent.   Ambulation/Gait Ambulation/Gait  assistance: Min assist Ambulation Distance (Feet): 50 Feet Assistive device: Rolling walker (2 wheeled) Gait Pattern/deviations: Step-through pattern;Decreased step length - right;Decreased step length - left;Decreased stride length;Trunk flexed     General Gait Details: Pt has a significantly kyphotic posture with forward head and neck. Intermittent assist to steady. O2 sat 87% on Ra during ambulation.   Stairs            Wheelchair Mobility    Modified Rankin (Stroke Patients Only)       Balance Overall balance assessment: Needs assistance;History of Falls         Standing balance support: Bilateral upper extremity supported   Standing balance comment: uses rollator at baseline                             Pertinent Vitals/Pain Pain Assessment: Faces Faces Pain Scale: Hurts little more Pain Location: back Pain Intervention(s): Limited activity within patient's tolerance;Repositioned    Home Living Family/patient expects to be discharged to:: Private residence Living Arrangements: Alone Available Help at Discharge: Personal care attendant(supervises showers ) Type of Home: Independent living facility Home Access: Level entry     Home Layout: One level Home Equipment: Walker - 4 wheels;Cane - single point;Walker - 2 wheels;Wheelchair - power      Prior Function Level of Independence: Independent with assistive device(s)         Comments: ambulatory with rollator     Hand Dominance        Extremity/Trunk Assessment   Upper Extremity Assessment Upper Extremity Assessment: Generalized  weakness    Lower Extremity Assessment Lower Extremity Assessment: Generalized weakness    Cervical / Trunk Assessment Cervical / Trunk Assessment: Kyphotic  Communication   Communication: HOH  Cognition Arousal/Alertness: Awake/alert Behavior During Therapy: WFL for tasks assessed/performed Overall Cognitive Status: Within Functional Limits for  tasks assessed                                        General Comments      Exercises     Assessment/Plan    PT Assessment Patient needs continued PT services  PT Problem List Decreased strength;Decreased activity tolerance;Decreased balance;Decreased mobility;Pain       PT Treatment Interventions DME instruction;Gait training;Functional mobility training;Therapeutic activities;Therapeutic exercise;Balance training;Patient/family education    PT Goals (Current goals can be found in the Care Plan section)  Acute Rehab PT Goals Patient Stated Goal: home.  PT Goal Formulation: With patient/family Time For Goal Achievement: 02/19/17 Potential to Achieve Goals: Good    Frequency Min 3X/week   Barriers to discharge        Co-evaluation               AM-PAC PT "6 Clicks" Daily Activity  Outcome Measure Difficulty turning over in bed (including adjusting bedclothes, sheets and blankets)?: Unable Difficulty moving from lying on back to sitting on the side of the bed? : Unable Difficulty sitting down on and standing up from a chair with arms (e.g., wheelchair, bedside commode, etc,.)?: Unable Help needed moving to and from a bed to chair (including a wheelchair)?: A Lot Help needed walking in hospital room?: A Little Help needed climbing 3-5 steps with a railing? : A Lot 6 Click Score: 10    End of Session Equipment Utilized During Treatment: Gait belt Activity Tolerance: Patient tolerated treatment well Patient left: in bed;with call bell/phone within reach;with bed alarm set;with family/visitor present   PT Visit Diagnosis: Muscle weakness (generalized) (M62.81);Difficulty in walking, not elsewhere classified (R26.2)    Time: 6712-4580 PT Time Calculation (min) (ACUTE ONLY): 21 min   Charges:   PT Evaluation $PT Eval Moderate Complexity: 1 Mod     PT G Codes:          Weston Anna, MPT Pager: [phone]

## 2017-02-05 NOTE — Plan of Care (Signed)
  Pain Managment: General experience of comfort will improve 02/05/2017 2037 - Progressing by Ashley Murrain, RN   Nutrition: Adequate nutrition will be maintained 02/05/2017 2037 - Progressing by Ashley Murrain, RN

## 2017-02-05 NOTE — Progress Notes (Signed)
[facility] Progress Note  CC:    Subjective:  Patient had esophageal manometry this AM.  Family is with the patient now and trying to get him to eat some applesauce, so far small amounts have stayed down.  Was not able to keep lunch or dinner down yesterday, however.  Objective:  Vital signs in last 24 hours: Temp:  [97.5 F (36.4 C)-99.5 F (37.5 C)] 99.5 F (37.5 C) (11/14 1352) Pulse Rate:  [61-66] 63 (11/14 1352) Resp:  [20-22] 20 (11/14 1352) BP: (100-180)/(47-79) 100/47 (11/14 1352) SpO2:  [95 %-100 %] 96 % (11/14 1352) Last BM Date: 02/03/17 General:  Alert, Well-developed, in NAD Heart:  Regular rate and rhythm; no murmurs Pulm:  CTAB anteriorly. Abdomen:  Soft, non-distended.  BS present.  Non-tender. Extremities:  Without edema. Neurologic:  Alert and oriented x 4;  grossly normal neurologically. Psych:  Alert and cooperative. Normal mood and affect.  Intake/Output from previous day: 11/13 0701 - 11/14 0700 In: 857.3 [P.O.:180; I.V.:377.3; IV Piggyback:300] Out: 1925 [Urine:1925]  Lab Results: Recent Labs    02/03/17 0237 02/04/17 0245 02/05/17 0452  WBC 10.3 9.4 10.6*  HGB 10.7* 9.9* 11.3*  HCT 31.7* 29.8* 33.7*  PLT 170 174 195   BMET Recent Labs    02/03/17 0237 02/04/17 0245 02/05/17 0452  NA 130* 132* 137  K 4.4 4.1 4.2  CL 104 104 106  CO2 20* 20* 22  GLUCOSE 95 93 82  BUN 31* 32* 29*  CREATININE 1.48* 1.64* 1.56*  CALCIUM 8.1* 8.2* 8.9   LFT Recent Labs    02/02/17 1826  PROT 7.3  ALBUMIN 3.6  AST 29  ALT 19  ALKPHOS 383*  BILITOT 0.8   Dg Esophagus  Result Date: 02/03/2017 CLINICAL DATA:  Dysphagia. EXAM: ESOPHOGRAM/BARIUM SWALLOW TECHNIQUE: Single contrast examination was performed using  thin barium. FLUOROSCOPY TIME:  Fluoroscopy Time:  2.8 minutes Radiation Exposure Index (if provided by the fluoroscopic device): 46.9 mGy COMPARISON:  None. FINDINGS: The esophagus is slightly tortuous with extensive  intermittent spasms with severe spasm just above the gastroesophageal junction. There is extensive to and fro peristalsis with contrast extending back into the oropharynx repeatedly, even in the semi-erect position. There is occasional relaxation of severe spasm in the distal esophagus. The lumen during relaxation is approximately 6-7 mm in diameter. No visible masses. Because of the severe distal spasm, I did not give the patient a barium tablet. IMPRESSION: 1. Extensive spasm throughout the esophagus with severe spasm in the distal esophagus just above the gastroesophageal junction. This area does intermittently relax to a diameter of approximately 6-7 mm. 2. To and fro peristalsis in the upper esophagus with reflux into the oropharynx repeatedly. Electronically Signed   By: Lorriane Shire M.D.   On: 02/03/2017 15:21   Dg Swallowing Func-speech Pathology  Result Date: 02/03/2017 Objective Swallowing Evaluation: Type of Study: MBS-Modified Barium Swallow Study  Patient Details Name: [name] [name] MRN: [id number] Date of Birth: 06-Sep-1922 Today's Date: 02/03/2017 Time: SLP Start Time (ACUTE ONLY): 2902 -SLP Stop Time (ACUTE ONLY): 1420 SLP Time Calculation (min) (ACUTE ONLY): 25 min Past Medical History: Past Medical History: Diagnosis Date . Cancer ([place])   Stomach . CHF (congestive heart failure) ([place])  . Collagen vascular disease ([place])  . Hypertension  Past Surgical History: Past Surgical History: Procedure Laterality Date . APPENDECTOMY   . COLONOSCOPY  12/1998, 02/2004  diverticulosois, external hemorrhoids . CRANIOTOMY Left  . ESOPHAGOGASTRODUODENOSCOPY  06/17/2014  Dr.  Fields: 1. Patent stricture at the gastroesophageal junction 2. UGIB Due to multiple  gastric ulcers 3. Moderate Duodentitis. Negative H.pylori . heart disease    permanent pacemaker . HIP FRACTURE SURGERY  2011  ORIF  R. . INGUINAL HERNIA REPAIR   . IR GENERIC HISTORICAL  10/26/2015  IR RADIOLOGIST EVAL & MGMT 10/26/2015 [facility]-INTERV RAD .  PACEMAKER INSERTION   HPI: pt is a 83 yo male adm to [facility] with UTI/fever.  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  He does report some difficulties after his stomach surgery in March 2016 to treat his cancer.  CXR negative.  Pt admits to progressive problems with swallowing noted recently but denies sudden onset.  He denies ever having swallow evaluation completed previously.   Subjective: pt awake in chair Assessment / Plan / Recommendation CHL IP CLINICAL IMPRESSIONS 02/03/2017 Clinical Impression Pt presents with functional oral swallow with pharyngocervical esophageal *suspect primary esophageal* deficits.  He was only tested with a few liquid boluses due to concerns for primary esophageal deficits and need for esophagram.  Premature spillage of boluses into pharynx observed - with swallow triggering at pyriform sinus with thin liquid via tsp.  Trace vallecular/pyriform sinus residual noted with nectar which pt independently dry swallows to clear.  Trace laryngeal penetration of thin before swallow due to decreased timing of laryngeal closure.  Pt did not aspirate but he does demonstrate backflow of liquid barium into pharynx/open larynx.  Upon esophageal sweep, his esophagus appeared full - ? spasms. Trace laryngeal penetration of thin/nectar backflowed material observed with pt conducting further swallows to help clear.  Suspect he is having some aspiration primarily due to his esophageal deficits.  Esophagram to follow, will follow up.  Educated pt to findings/concerns using teach back.   SLP Visit Diagnosis Dysphagia, pharyngoesophageal phase (R13.14);Dysphagia, unspecified (R13.10) Attention and concentration deficit following -- Frontal lobe and executive function deficit following -- Impact on safety and function Risk for inadequate nutrition/hydration;Moderate aspiration risk   CHL IP TREATMENT  RECOMMENDATION 02/03/2017 Treatment Recommendations Therapy as outlined in treatment plan below   Prognosis 02/03/2017 Prognosis for Safe Diet Advancement Guarded Barriers to Reach Goals -- Barriers/Prognosis Comment -- CHL IP DIET RECOMMENDATION 02/03/2017 SLP Diet Recommendations NPO;Ice chips PRN after oral care Liquid Administration via -- Medication Administration -- Compensations -- Postural Changes --   CHL IP OTHER RECOMMENDATIONS 02/03/2017 Recommended Consults Consider esophageal assessment Oral Care Recommendations -- Other Recommendations --   CHL IP FOLLOW UP RECOMMENDATIONS 03/16/2015 Follow up Recommendations None   CHL IP FREQUENCY AND DURATION 02/03/2017 Speech Therapy Frequency (ACUTE ONLY) min 1 x/week Treatment Duration 1 week      CHL IP ORAL PHASE 02/03/2017 Oral Phase Impaired Oral - Pudding Teaspoon -- Oral - Pudding Cup -- Oral - Honey Teaspoon -- Oral - Honey Cup -- Oral - Nectar Teaspoon -- Oral - Nectar Cup -- Oral - Nectar Straw WFL Oral - Thin Teaspoon WFL Oral - Thin Cup -- Oral - Thin Straw WFL Oral - Puree -- Oral - Mech Soft -- Oral - Regular -- Oral - Multi-Consistency -- Oral - Pill -- Oral Phase - Comment --  CHL IP PHARYNGEAL PHASE 02/03/2017 Pharyngeal Phase WFL Pharyngeal- Pudding Teaspoon -- Pharyngeal -- Pharyngeal- Pudding Cup -- Pharyngeal -- Pharyngeal- Honey Teaspoon -- Pharyngeal -- Pharyngeal- Honey Cup -- Pharyngeal -- Pharyngeal- Nectar Teaspoon -- Pharyngeal -- Pharyngeal- Nectar Cup -- Pharyngeal --  Pharyngeal- Nectar Straw WFL;Delayed swallow initiation-pyriform sinuses;Pharyngeal residue - valleculae;Pharyngeal residue - pyriform Pharyngeal -- Pharyngeal- Thin Teaspoon WFL Pharyngeal -- Pharyngeal- Thin Cup -- Pharyngeal -- Pharyngeal- Thin Straw WFL;Penetration/Apiration after swallow;Penetration/Aspiration before swallow;Reduced airway/laryngeal closure;Reduced laryngeal elevation;Pharyngeal residue - valleculae;Pharyngeal residue - pyriform Pharyngeal Material  enters airway, remains ABOVE vocal cords then ejected out;Material enters airway, remains ABOVE vocal cords and not ejected out Pharyngeal- Puree -- Pharyngeal -- Pharyngeal- Mechanical Soft -- Pharyngeal -- Pharyngeal- Regular -- Pharyngeal -- Pharyngeal- Multi-consistency -- Pharyngeal -- Pharyngeal- Pill -- Pharyngeal -- Pharyngeal Comment --  CHL IP CERVICAL ESOPHAGEAL PHASE 02/03/2017 Cervical Esophageal Phase Impaired Pudding Teaspoon -- Pudding Cup -- Honey Teaspoon -- Honey Cup -- Nectar Teaspoon -- Nectar Cup -- Nectar Straw Esophageal backflow into the pharynx;Esophageal backflow into cervical esophagus;Reduced cricopharyngeal relaxation Thin Teaspoon Esophageal backflow into the pharynx;Esophageal backflow into cervical esophagus Thin Cup -- Thin Straw Esophageal backflow into the pharynx;Esophageal backflow into cervical esophagus Puree -- Mechanical Soft -- Regular -- Multi-consistency -- Pill -- Cervical Esophageal Comment ? reduced relaxation of cricopharyngeus and esophageal backflow into pharynx, pt conducts multiple swallows CHL IP GO 02/03/2017 Functional Assessment Tool Used clinical judgement Functional Limitations Swallowing Swallow Current Status (Y6503) CM Swallow Goal Status (T4656) CL Swallow Discharge Status (C1275) (None) Motor Speech Current Status (T7001) (None) Motor Speech Goal Status (V4944) (None) Motor Speech Goal Status (H6759) (None) Spoken Language Comprehension Current Status (F6384) (None) Spoken Language Comprehension Goal Status (Y6599) (None) Spoken Language Comprehension Discharge Status [phone]) (None) Spoken Language Expression Current Status [phone]) (None) Spoken Language Expression Goal Status (Q3009) (None) Spoken Language Expression Discharge Status [phone]) (None) Attention Current Status (T6226) (None) Attention Goal Status (J3354) (None) Attention Discharge Status (T6256) (None) Memory Current Status (L8937) (None) Memory Goal Status (D4287) (None) Memory Discharge  Status (G8115) (None) Voice Current Status (B2620) (None) Voice Goal Status (B5597) (None) Voice Discharge Status (C1638) (None) Other Speech-Language Pathology Functional Limitation Current Status (G5364) (None) Other Speech-Language Pathology Functional Limitation Goal Status (W8032) (None) Other Speech-Language Pathology Functional Limitation Discharge Status [phone]) (None) Luanna Salk, MS [facility] SLP [place] 02/03/2017, 4:24 PM              Assessment / Plan: 1. 83 yo male admitted with chills / SOB. Regurgitating copious amounts of phlegm and some undigested pieces of food over last several months. Barium swallow demonstrates intermittent spasms of the esophagus with severe distal esophageal spasms and repeated reflux into the oropharynx. Despite findings patient doesn't really give a hx of dysphagia. No chest pain which sometimes accompanies esophageal spasms. He has had some mild weight loss. MBSS suggests primary esophageal problem.  EGD on 11/13 was normal.  Esophageal manometry was performed this AM.  2. Cirrhosis, first identified in 2016 on imagingstudy. Followed by Dr. Valeta Harms doesn't look like he has seen her since 2016.Workup for etiology was negative except for astrongly positive AMA.Alk phos chronically elevated but over the last 1.5 years it has continued to trend up from 72 to 380. Based on outpatient medication list he doesn't appear to be on Urso for cholestasis .  -No evidence for hepaticdecompensation at present.  Follow-up with Dr. Oneida Alar.  3. 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 No recurrence noted.  4. Recent non-displaced C1 ring fx following a fall. Being managed conservatively. Requiring narcotics.  5. Hx of CHF, ICD placement, CKD / polycystic kidneys  6. Mild dementia, on Aricept  *Dr. Silverio Decamp is aware of the esophageal manometry study and  will try to get that read so that we have results of that soon.  If the  patient is able to tolerate some PO without too many issues then he could be discharged prior to receiving those results if needed, although he has not been tolerating much so far.    LOS: 2 days   Laban Emperor. Zehr  02/05/2017, 2:14 PM  Pager number [phone]  GI ATTENDING  Interval history data reviewed. Patient seen and examined. No family members in room. Manometry completed. Unable to Pass probe beyond the midesophagus. Study reviewed by Dr. Silverio Decamp. Nondiagnostic. Suggestion of peristalsis noted. Cannot rule out vigorous achalasia. He is extremely frail with multiple problems as noted. His prognosis is poor. Monitor his eating. Could consider empiric Botox injection endoscopically as a last ditch effort. Will discuss with Dr. Loletha Carrow. GI will follow for now.  Docia Chuck. Geri [place]., M.D. [facility] Division of Gastroenterology

## 2017-02-05 NOTE — Consult Note (Signed)
[place] Nurse wound consult note Reason for Consult: Erythematous area on buttocks and between gluteal cleft. Small partial thickness tissue loss. Wound type: moisture associated skin damage (MASD) with friction injury Pressure Injury POA: NA Measurement: 4cm x 4cm erythematous area with blanching. 1cm x 1.5cm x 0.1cm area of partial thickness tissue loss. Pink, dry wound bed. No drainage. Wound bed:AS described above Drainage (amount, consistency, odor) As described above Periwound:intact, dry Dressing procedure/placement/frequency: Silicone foam discontinued in favor of three times daily application of moisture barrier ointment to the affected area.  Additionally, I have provided a pressure redistribution chair cushion for when patient is OOB in chair. Guidance is provided for turning and repositioning that takes into consideration the times that patient has to have [place] elevated for meals and for 2 hours post meal. [place] nursing team will not follow, but will remain available to this patient, the nursing and medical teams.  Please re-consult if needed. Thanks, Maudie Flakes, MSN, RN, [place], Arther Abbott  Pager# [phone]

## 2017-02-05 NOTE — Progress Notes (Signed)
Esophageal Manometry attempted. Patient to unit. SOB on O2. SATS 99% on 3L and 92-95% on RA.  Sats were good on RA but kept on O2 for procedure because pt seemed SOB. He states his breathing is about the same as it has been. SEcond RN at bedside to assist. Pt sitting straight up in stretcher to prevent aspiration. Pt coughing up phlegm before test.  No lidocaine used in nare to prevent aspiration. Pt tolerated well but probe only able to be passed part of the way into esophagus due to tortuosity and dilated esophagus. Repeated attempts to get probe to pass into stomach. Unable to pass probe easily. Preformed saline swallows to try to get function of upper esophagus. Probe still not able to advance into stomach. Pt without coughing or gagging during study. Remained on O2. Pt stated he tolerated well. PRobe removed. No complications or distress noted. Unable to preform full study.

## 2017-02-06 ENCOUNTER — Encounter (HOSPITAL_COMMUNITY): Payer: Self-pay | Admitting: Internal Medicine

## 2017-02-06 LAB — GLUCOSE, CAPILLARY
GLUCOSE-CAPILLARY: 124 mg/dL — AB (ref 65–99)
GLUCOSE-CAPILLARY: 137 mg/dL — AB (ref 65–99)
GLUCOSE-CAPILLARY: 143 mg/dL — AB (ref 65–99)
GLUCOSE-CAPILLARY: 98 mg/dL (ref 65–99)
Glucose-Capillary: 85 mg/dL (ref 65–99)

## 2017-02-06 MED ORDER — DEXTROSE 5 % IV SOLN
500.0000 mg | INTRAVENOUS | Status: AC
  Administered 2017-02-07: 500 mg via INTRAVENOUS
  Filled 2017-02-06: qty 500

## 2017-02-06 MED ORDER — DEXTROSE 5 % IV SOLN
1.0000 g | INTRAVENOUS | Status: DC
  Administered 2017-02-06 – 2017-02-08 (×3): 1 g via INTRAVENOUS
  Filled 2017-02-06 (×4): qty 10

## 2017-02-06 NOTE — Progress Notes (Signed)
SLP to sign off at this time as pt with primary esophageal deficits, appreciate GI assistance with this patient.   Luanna Salk, [place] [facility] SLP [phone]

## 2017-02-06 NOTE — Plan of Care (Signed)
Pt is comfortable and has no trouble with bowel movements.

## 2017-02-06 NOTE — H&P (View-Only) (Signed)
   [facility] Progress Note  CC:  Dysphagia   Subjective:  Did ok with eating yesterday and so far today but then just took a pill that got stuck.  Objective:  Vital signs in last 24 hours: Temp:  [97.5 F (36.4 C)-99.5 F (37.5 C)] 98 F (36.7 C) (11/15 0546) Pulse Rate:  [60-66] 60 (11/15 0829) Resp:  [16-20] 18 (11/15 0829) BP: (100-180)/(47-75) 140/65 (11/15 0829) SpO2:  [94 %-100 %] 95 % (11/15 0829) Last BM Date: 02/03/17 General:  Alert, Well-developed, in NAD Heart:  Regular rate and rhythm; no murmurs Pulm:  CTAB anteriorly.  Slight increased WOB at times. Abdomen:  Soft, non-distended.  BS present.  Non-tender. Extremities:  Without edema. Neurologic:  Alert and oriented x 4;  grossly normal neurologically. Psych:  Alert and cooperative. Normal mood and affect.  Intake/Output from previous day: 11/14 0701 - 11/15 0700 In: 780 [P.O.:480; IV Piggyback:300] Out: 776 [Urine:775; Stool:1]  Lab Results: Recent Labs    02/04/17 0245 02/05/17 0452  WBC 9.4 10.6*  HGB 9.9* 11.3*  HCT 29.8* 33.7*  PLT 174 195   BMET Recent Labs    02/04/17 0245 02/05/17 0452  NA 132* 137  K 4.1 4.2  CL 104 106  CO2 20* 22  GLUCOSE 93 82  BUN 32* 29*  CREATININE 1.64* 1.56*  CALCIUM 8.2* 8.9   Assessment / Plan: 1. 83 yo male admitted with chills / SOB. Regurgitating copious amounts of phlegm and some undigested pieces of food over last several months. Barium swallow demonstrates intermittent spasms of the esophagus with severe distal esophageal spasms and repeated reflux into the oropharynx. Despite findings patient doesn't really give a hx of dysphagia. No chest pain which sometimes accompanies esophageal spasms. He has had some mild weight loss. MBSS suggests primary esophageal problem.  EGD on 11/13 was normal.  Esophageal manometry was performed on 11/14, unable to pass probe beyond the midesophagus. Study reviewed by Dr. Nandigam. Nondiagnostic.  Suggestion of peristalsis noted. Cannot rule out vigorous achalasia.    -Will attempt EGD with Botox injection on 11/16.  2. Cirrhosis, first identified in 2016 on imagingstudy. Followed by Dr. Fieldsthough doesn't look like he has seen her since 2016.Workup for etiology was negative except for astrongly positive AMA.Alk phos chronically elevated but over the last 1.5 years it has continued to trend up from 72 to 380. Based on outpatient medication list he doesn't appear to be on Urso for cholestasis .  -No evidence for hepaticdecompensation at present.Follow-up with Dr. Fields.  3. Hx of gastric B cellLymphoma treated by Dr. Gorsuch. No recurrence noted.  4. Recent non-displaced C1 ring fx following a fall. Being managed conservatively. Requiring narcotics.  5. Hx of CHF, ICD placement, CKD / polycystic kidneys  6. Mild dementia, on Aricept     LOS: 3 days   Jessica D. Zehr  02/06/2017, 9:36 AM  Pager number [phone]  I have discussed the case with the PA, and that is the plan I formulated. I personally interviewed and examined the patient.  I spoke with Mr. Amacher, unfortunately his family was not present at that time. As near as I can tell, he continues to have dysphagia limiting his oral intake. He has an esophageal motility disorder that cannot be further characterized, but is suggestive of possible achalasia.  The current plan is for an upper endoscopy with Botox injection of the LES tomorrow. He is agreeable, and we will update his family about it as   well as. Hopefully they will be present for his procedure tomorrow, and we can certainly call them if they are not. Dr. Perry indicated that Mr. Coatney's family was open to that possibility when he spoke with him yesterday.  Patient at increased risk for cardiopulmonary complications of procedure due to medical comorbidities and advanced age..    Richardson Dubree L Danis III Pager [phone]  Mon-Fri 8a-5p [phone]  after 5p, weekends, holidays  

## 2017-02-06 NOTE — Care Management Note (Signed)
Case Management Note  Patient Details  Name: [name] [name] MRN: [id number] Date of Birth: 06/05/1922  Subjective/Objective:  Spoke to dtr Ivin Booty c#[phone] about d/c plans-she would like to explore ALF-CSW cons placed. [facility] is already following for HHC-recc HHRN/PT/OT/aide,st,csw. Ivin Booty has set up [employer] [facility](private duty care for 24hr custodial assist).                  Action/Plan:d/c plan ALF w/HHC.   Expected Discharge Date:                  Expected Discharge Plan:  Assisted Living / Rest Home  In-House Referral:  Clinical Social Work  Discharge planning Services  CM Consult  Post Acute Care Choice:    Choice offered to:  Adult Children  DME Arranged:    DME Agency:     HH Arranged:  RN, PT, OT, Nurse's Aide, Speech Therapy, Social Work [employer] Agency:  [facility]  Status of Service:  In process, will continue to follow  If discussed at Long Length of Stay Meetings, dates discussed:    Additional Comments:  Dessa Phi, RN 02/06/2017, 3:56 PM

## 2017-02-06 NOTE — Progress Notes (Signed)
[facility] Progress Note  CC:  Dysphagia   Subjective:  Did ok with eating yesterday and so far today but then just took a pill that got stuck.  Objective:  Vital signs in last 24 hours: Temp:  [97.5 F (36.4 C)-99.5 F (37.5 C)] 98 F (36.7 C) (11/15 0546) Pulse Rate:  [60-66] 60 (11/15 0829) Resp:  [16-20] 18 (11/15 0829) BP: (100-180)/(47-75) 140/65 (11/15 0829) SpO2:  [94 %-100 %] 95 % (11/15 0829) Last BM Date: 02/03/17 General:  Alert, Well-developed, in NAD Heart:  Regular rate and rhythm; no murmurs Pulm:  CTAB anteriorly.  Slight increased WOB at times. Abdomen:  Soft, non-distended.  BS present.  Non-tender. Extremities:  Without edema. Neurologic:  Alert and oriented x 4;  grossly normal neurologically. Psych:  Alert and cooperative. Normal mood and affect.  Intake/Output from previous day: 11/14 0701 - 11/15 0700 In: 780 [P.O.:480; IV Piggyback:300] Out: 776 [Urine:775; Stool:1]  Lab Results: Recent Labs    02/04/17 0245 02/05/17 0452  WBC 9.4 10.6*  HGB 9.9* 11.3*  HCT 29.8* 33.7*  PLT 174 195   BMET Recent Labs    02/04/17 0245 02/05/17 0452  NA 132* 137  K 4.1 4.2  CL 104 106  CO2 20* 22  GLUCOSE 93 82  BUN 32* 29*  CREATININE 1.64* 1.56*  CALCIUM 8.2* 8.9   Assessment / Plan: 1. 83 yo male admitted with chills / SOB. Regurgitating copious amounts of phlegm and some undigested pieces of food over last several months. Barium swallow demonstrates intermittent spasms of the esophagus with severe distal esophageal spasms and repeated reflux into the oropharynx. Despite findings patient doesn't really give a hx of dysphagia. No chest pain which sometimes accompanies esophageal spasms. He has had some mild weight loss. MBSS suggests primary esophageal problem.  EGD on 11/13 was normal.  Esophageal manometry was performed on 11/14, unable to pass probe beyond the midesophagus. Study reviewed by Dr. Silverio Decamp. Nondiagnostic.  Suggestion of peristalsis noted. Cannot rule out vigorous achalasia.    -Will attempt EGD with Botox injection on 11/16.  2. Cirrhosis, first identified in 2016 on imagingstudy. Followed by Dr. Valeta Harms doesn't look like he has seen her since 2016.Workup for etiology was negative except for astrongly positive AMA.Alk phos chronically elevated but over the last 1.5 years it has continued to trend up from 72 to 380. Based on outpatient medication list he doesn't appear to be on Urso for cholestasis .  -No evidence for hepaticdecompensation at present.Follow-up with Dr. Oneida Alar.  3. 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 No recurrence noted.  4. Recent non-displaced C1 ring fx following a fall. Being managed conservatively. Requiring narcotics.  5. Hx of CHF, ICD placement, CKD / polycystic kidneys  6. Mild dementia, on Aricept     LOS: 3 days   Laban Emperor. Zehr  02/06/2017, 9:36 AM  Pager number [phone]  I have discussed the case with the PA, and that is the plan I formulated. I personally interviewed and examined the patient.  I spoke with Mr. Wong, unfortunately his family was not present at that time. As near as I can tell, he continues to have dysphagia limiting his oral intake. He has an esophageal motility disorder that cannot be further characterized, but is suggestive of possible achalasia.  The current plan is for an upper endoscopy with Botox injection of the LES tomorrow. He is agreeable, and we will update his family about it as  well as. Hopefully they will be present for his procedure tomorrow, and we can certainly call them if they are not. Dr. Henrene Pastor indicated that Mr. Penkala family was open to that possibility when he spoke with him yesterday.  Patient at increased risk for cardiopulmonary complications of procedure due to medical comorbidities and advanced age.Marland Kitchen    Nelida Meuse III Pager [phone]  Mon-Fri 8a-5p [phone]  after 5p, weekends, holidays

## 2017-02-06 NOTE — Progress Notes (Signed)
@IPLOG @        PROGRESS NOTE                                                                                                                                                                                                             Patient Demographics:    [name] [name], is a 83 y.o. male, DOB - 05-13-1922, MHD:[id number]  Admit date - 02/02/2017   Admitting Physician Rise Patience, MD  Outpatient Primary MD for the patient is Plotnikov, Evie Lacks, MD  LOS - 3  Chief Complaint  Patient presents with  . Chills  . Cough       Brief Narrative  [name] [name] is a 83 y.o. male with history of chronic systolic heart failure status post ICD placement, history of sinus node dysfunction status post pacemaker placement, history of B-cell lymphoma in remission, chronic kidney disease stage IV, chronic anemia hypothyroidism cirrhosis of liver, hypertension presents to the ER for the second time in 48 hours for fever and chills.  Patient has come to the ER 2 days ago with dysuria shortness of breath and fever chills.  At that time patient was diagnosed with UTI chest x-ray was unremarkable and patient was discharged back on antibiotics.    He was admitted for URI, mild acute on chronic systolic CHF, also showed signs of dysphagia for which GI has been consulted as initial studies suggest he might have underlying achalasia as well.   Subjective:   Had restful night, slept well. No events reported by pt or RN. Ate breakfast ok, but still having significant problems with pills that can't be crushed.    Assessment  & Plan :   Staphylococcus lugdunensis UTI: - Continue ceftriaxone based on pan-sensitivity on culture, including oxacillin sensitivity.   Fever, chills, cough: Presumptive diagnosis of early pneumonia without CXR infiltrate ?atypical.  - Continue ceftriaxone as above and added azithromycin, plan to complete 7 days.   - Continue bronchodilators, mucomyst, flutter  valve  Reflux/regurgitation: Suspected esophageal dysmotility with negative EGD. Severe distal esophageal spasms and oropharyngeal reflux on barium swallow. - Appreciate GI recommendations: Esophageal manometry testing was nondiagnostic, planning empiric botox injections at EGD 11/16.  - Continue SLP evaluation, soft diet - Continue PPI - Was started on nicardipine TIDAC, though I would recommend caution with this given LV dysfunction.  Mild acute on chronic systolic CHF EF 21-19%.  Has AICD, followed by Dr. Lovena Le. - Appears euvolemic after IV lasix. Will hold diuretics and fluids for now (no diuretics on  home med list) - Continue long-acting nitrate. Not on BB or ACE/ARB due to hypotension.   Idiopathic hepatic cirrhosis: Compensated - Follow up with Dr. Oneida Alar as outpatient.  Frequent falls with non-displaced C1 ring fracture:  - Conservative management, requiring opioids for pain control.  - Recommend DC back to ILF with 24-hr supervision and significant home health assistance (PT, OT, aide, SLP)  BPH:  - Continue flomax.  Hypothyroidism.   - Synthroid 112 mcg continued - TSH borderline elevated at 4.962, repeat TSH in 4-6 weeks.  Mild dementia:  - Continue aricept - Delirium precautions reviewed with pt and family.   History of gastric lymphoma: In remission, followed by Dr. Alvy Bimler. No masses on EGD 11/13.   Dyslipidemia:  - Continue statin  Stage IV CKD: Baseline SCr ~1.6.  - Monitor intermittently as this has been stable at baseline.  Anemia of chronic disease: - Stable no acute issues.  Diet : DIET SOFT Room service appropriate? Yes; Fluid consistency: Thin Diet NPO time specified    Family Communication  :  None  Code Status :  Full  Disposition Plan  :   DC back to ILF with 24-hr supervision and significant home health assistance (PT, OT, aide, SLP)  Consults  : GI  Procedures  :    Esophagogram showing changes suggestive of achalasia or esophageal  spasm.  EGD 02/04/2017 EGD with botox injection 02/07/2017  DVT Prophylaxis  :  SCDs  Inpatient Medications  Scheduled Meds: . atorvastatin  10 mg Oral BID  . calcium carbonate  500 mg of elemental calcium Oral BID WC  . cholecalciferol  2,000 Units Oral Daily  . donepezil  10 mg Oral QHS  . ipratropium-albuterol  3 mL Nebulization BID  . isosorbide dinitrate  5 mg Oral TID  . levothyroxine  112 mcg Oral QAC breakfast  . multivitamin with minerals  1 tablet Oral Daily  . NIFEdipine  10 mg Oral TID AC  . nitroGLYCERIN  1 spray Sublingual TID AC  . pantoprazole  40 mg Oral Daily  . tamsulosin  0.4 mg Oral QPM  . triamcinolone  2 spray Nasal Daily  . triamcinolone ointment  1 application Topical BID   Continuous Infusions: . azithromycin Stopped (02/05/17 1702)  . cefTRIAXone (ROCEPHIN)  IV Stopped (02/05/17 2047)   PRN Meds:.acetaminophen **OR** [DISCONTINUED] acetaminophen, albuterol, docusate sodium, fentaNYL (SUBLIMAZE) injection, hydrALAZINE, nitroGLYCERIN, [DISCONTINUED] ondansetron **OR** ondansetron (ZOFRAN) IV  Antibiotics  :    Anti-infectives (From admission, onward)   Start     Dose/Rate Route Frequency Ordered Stop   02/03/17 2000  cefTRIAXone (ROCEPHIN) 1 g in dextrose 5 % 50 mL IVPB     1 g 100 mL/hr over 30 Minutes Intravenous Every 24 hours 02/03/17 1104     02/03/17 1400  azithromycin (ZITHROMAX) 500 mg in dextrose 5 % 250 mL IVPB     500 mg 250 mL/hr over 60 Minutes Intravenous Every 24 hours 02/03/17 1239     02/02/17 2030  cefTRIAXone (ROCEPHIN) 1 g in dextrose 5 % 50 mL IVPB     1 g 100 mL/hr over 30 Minutes Intravenous  Once 02/02/17 2019 02/02/17 2117         Objective:   Vitals:   02/05/17 2048 02/06/17 0546 02/06/17 0829 02/06/17 1312  BP:  118/69 140/65 (!) 110/50  Pulse:  60 60 66  Resp:  16 18 18   Temp:  98 F (36.7 C)  98.6 F (37 C)  TempSrc:  Oral  Oral  SpO2: 94% 97% 95% 95%  Weight:      Height:        BP (!) 110/50 (BP  Location: Left Arm)   Pulse 66   Temp 98.6 F (37 C) (Oral)   Resp 18   Ht 5\' 4"  (1.626 m)   Wt 67.7 kg (149 lb 4 oz)   SpO2 95%   BMI 25.62 kg/m  Gen: Pleasant elderly male in NAD HEENT: MMM, posterior oropharynx clear Pulm: Non-labored, transmitted upper airway sounds/diffuse rhonchi without wheezes or crackles at bases.  CV: Regular rate, no murmur appreciated; distal pulses intact/symmetric GI: + BS; soft, non-tender, non-distended Skin: No rashes, wounds, ulcers Neuro: Alert, oriented, CN II-XII without deficits   Data Review:    CBC Recent Labs  Lab 02/01/17 1231 02/02/17 1826 02/03/17 0237 02/04/17 0245 02/05/17 0452  WBC 9.6 12.9* 10.3 9.4 10.6*  HGB 11.2* 12.5* 10.7* 9.9* 11.3*  HCT 33.0* 36.5* 31.7* 29.8* 33.7*  PLT 193 197 170 174 195  MCV 92.2 92.9 93.5 93.7 93.4  MCH 31.3 31.8 31.6 31.1 31.3  MCHC 33.9 34.2 33.8 33.2 33.5  RDW 15.2 15.3 15.2 15.1 15.2  LYMPHSABS  --  1.4  --   --   --   MONOABS  --  2.0*  --   --   --   EOSABS  --  0.5  --   --   --   BASOSABS  --  0.0  --   --   --     Chemistries  Recent Labs  Lab 02/01/17 1231 02/02/17 1826 02/03/17 0237 02/04/17 0245 02/05/17 0452  NA 130* 128* 130* 132* 137  K 4.3 5.1 4.4 4.1 4.2  CL 97* 96* 104 104 106  CO2 21* 21* 20* 20* 22  GLUCOSE 111* 107* 95 93 82  BUN 38* 36* 31* 32* 29*  CREATININE 1.89* 1.68* 1.48* 1.64* 1.56*  CALCIUM 8.7* 9.0 8.1* 8.2* 8.9  MG  --   --   --  1.6*  --   AST  --  29  --   --   --   ALT  --  19  --   --   --   ALKPHOS  --  383*  --   --   --   BILITOT  --  0.8  --   --   --    Micro Results Recent Results (from the past 240 hour(s))  Urine culture     Status: None (Preliminary result)   Collection Time: 02/01/17  2:39 PM  Result Value Ref Range Status   Specimen Description URINE, RANDOM  Final   Special Requests NONE  Final   Culture   Final    CULTURE REINCUBATED FOR BETTER GROWTH Performed at [facility], [address], [place],  [place] [address]    Report Status PENDING  Incomplete  Urine culture     Status: Abnormal   Collection Time: 02/02/17  6:28 PM  Result Value Ref Range Status   Specimen Description URINE, RANDOM  Final   Special Requests NONE  Final   Culture 60,000 COLONIES/mL STAPHYLOCOCCUS LUGDUNENSIS (A)  Final   Report Status 02/05/2017 FINAL  Final   Organism ID, Bacteria STAPHYLOCOCCUS LUGDUNENSIS (A)  Final      Susceptibility   Staphylococcus lugdunensis - MIC*    CIPROFLOXACIN <=0.5 SENSITIVE Sensitive     GENTAMICIN <=0.5 SENSITIVE Sensitive     NITROFURANTOIN <=16  SENSITIVE Sensitive     OXACILLIN 1 SENSITIVE Sensitive     TETRACYCLINE <=1 SENSITIVE Sensitive     VANCOMYCIN <=0.5 SENSITIVE Sensitive     TRIMETH/SULFA <=10 SENSITIVE Sensitive     CLINDAMYCIN <=0.25 SENSITIVE Sensitive     RIFAMPIN <=0.5 SENSITIVE Sensitive     Inducible Clindamycin NEGATIVE Sensitive     * 60,000 COLONIES/mL STAPHYLOCOCCUS LUGDUNENSIS  Blood culture (routine x 2)     Status: None (Preliminary result)   Collection Time: 02/02/17  7:27 PM  Result Value Ref Range Status   Specimen Description BLOOD LEFT FOREARM  Final   Special Requests   Final    BOTTLES DRAWN AEROBIC AND ANAEROBIC Blood Culture adequate volume   Culture   Final    NO GROWTH 3 DAYS Performed at [facility], [address], [place], [place] [address]    Report Status PENDING  Incomplete  Blood culture (routine x 2)     Status: None (Preliminary result)   Collection Time: 02/02/17  8:40 PM  Result Value Ref Range Status   Specimen Description BLOOD RIGHT HAND  Final   Special Requests   Final    BOTTLES DRAWN AEROBIC AND ANAEROBIC Blood Culture adequate volume   Culture   Final    NO GROWTH 3 DAYS Performed at [facility], [place] [address], [place], [place] [address]    Report Status PENDING  Incomplete  MRSA PCR Screening     Status: None   Collection Time: 02/03/17  5:14 AM  Result Value Ref Range Status   MRSA by PCR  NEGATIVE NEGATIVE Final    Comment:        The GeneXpert MRSA Assay (FDA approved for NASAL specimens only), is one component of a comprehensive MRSA colonization surveillance program. It is not intended to diagnose MRSA infection nor to guide or monitor treatment for MRSA infections.   Culture, expectorated sputum-assessment     Status: None   Collection Time: 02/03/17  9:30 AM  Result Value Ref Range Status   Specimen Description EXPECTORATED SPUTUM  Final   Special Requests NONE  Final   Sputum evaluation   Final    Sputum specimen not acceptable for testing.  Please recollect.   INFORMED DUNKLEBERGER,C. RN @1147  ON 11.12.18 BY NMCCOY    Report Status 02/03/2017 FINAL  Final    Radiology Reports Dg Chest 2 View  Result Date: 02/01/2017 CLINICAL DATA:  Increasingly short of breath since fall 3 weeks ago. EXAM: CHEST  2 VIEW COMPARISON:  05/15/2015 FINDINGS: The heart remains normal in size. Aorta rib is tortuous. Mediastinum is rotated to the right limiting evaluation of the mediastinum. Lungs are under aerated. Chronic interstitial changes. Calcified granulomata at the right lung base. Stable thoracic spine. Osteopenia. No pneumothorax or pleural effusion. AICD device is unchanged via left subclavian vein. Chronic sternal deformity. IMPRESSION: No active cardiopulmonary disease. Electronically Signed   By: Marybelle Killings M.D.   On: 02/01/2017 11:54   Dg Ribs Unilateral Left  Result Date: 02/01/2017 CLINICAL DATA:  Increasing SOB since fall 3 weeks ago; left lower rib pain x 3 weeks; hx gastric CA, CHF, HTN; ex smoker quit 40 years ago; EXAM: LEFT RIBS - 2 VIEW COMPARISON:  Current chest radiograph. Previous chest radiograph, 05/15/2015. FINDINGS: No rib fracture or rib lesion.  Bony thorax is demineralized. No left lung consolidation. No pleural effusion or evidence of a pneumothorax. IMPRESSION: No rib fracture or rib lesion. Electronically Signed  By: Lajean Manes M.D.   On:  02/01/2017 11:50    Dg Chest Port 1 View  Result Date: 02/02/2017 CLINICAL DATA:  83 year old male with cough and fever. EXAM: PORTABLE CHEST 1 VIEW COMPARISON:  Chest radiograph dated 02/01/2017 FINDINGS: There is shallow inspiration with bibasilar linear atelectasis/ scarring. Stable calcified granuloma at the right lung base. No focal consolidation, pleural effusion, or pneumothorax. The cardiac silhouette is within normal limits. There is atherosclerotic calcification of the aorta. Left pectoral dual lead AICD device. There is osteopenia with degenerative changes of the spine. No acute osseous pathology. IMPRESSION: No active disease. Electronically Signed   By: Anner Crete M.D.   On: 02/02/2017 19:10    Time Spent:  25 minutes   Vance Gather M.D on 02/06/2017 at 1:59 PM  Between 7am to 7pm - Pager - [phone] ( page via [email or web address], text pages only, please mention full 10 digit call back number). After 7pm go to [email or web address] - password [facility]

## 2017-02-06 NOTE — Care Management Important Message (Signed)
Important Message  Patient Details  Name: [name] [name] MRN: [id number] Date of Birth: 1922-12-27   Medicare Important Message Given:  Yes    Kerin Salen 02/06/2017, 10:02 AMImportant Message  Patient Details  Name: [name] [name] MRN: [id number] Date of Birth: 02/01/1923   Medicare Important Message Given:  Yes    Kerin Salen 02/06/2017, 10:02 AM

## 2017-02-06 NOTE — Progress Notes (Signed)
Physical Therapy Treatment Patient Details Name: [name] [name] MRN: [id number] DOB: 09-19-1922 Today's Date: 02/06/2017    History of Present Illness 83 yo male admitted with UTI. Recent fall at home resulting in compression fxs per family and chart. Hx of fall, R hip fx-s/p ORIF, OA, hearing loss, Sz, lymphoma, CKD, pacemaker, CHF. Pt is from an Ind Living facility    PT Comments    Pt assisted with ambulating in hallway and able to progress distance slightly.  Pt on room air upon entering room however continues to cough and require suction device.  Pt's SPO2 94% on room air after ambulating and return to supine.  Pt reports increased fatigue after mobility.   Follow Up Recommendations  Home health PT;Supervision/Assistance - 24 hour     Equipment Recommendations  None recommended by PT    Recommendations for Other Services       Precautions / Restrictions Precautions Precautions: Fall    Mobility  Bed Mobility Overal bed mobility: Needs Assistance Bed Mobility: Supine to Sit;Sit to Supine     Supine to sit: HOB elevated;Supervision Sit to supine: HOB elevated;Min assist   General bed mobility comments: Increased time. Assist for LEs onto bed.  At baseline, pt uses a trapeze  Transfers Overall transfer level: Needs assistance Equipment used: Rolling walker (2 wheeled) Transfers: Sit to/from Stand Sit to Stand: From elevated surface;Min assist         General transfer comment: slight assist to rise, steady, control descent.   Ambulation/Gait Ambulation/Gait assistance: Min assist Ambulation Distance (Feet): 90 Feet Assistive device: Rolling walker (2 wheeled) Gait Pattern/deviations: Step-through pattern;Decreased stride length;Trunk flexed     General Gait Details: Pt has a significantly kyphotic posture with forward head and neck. Intermittent assist to steady. SpO2 94% on room air upon return to supine however pt with increased work of  breathing   Financial trader Rankin (Stroke Patients Only)       Balance                                            Cognition Arousal/Alertness: Awake/alert Behavior During Therapy: WFL for tasks assessed/performed Overall Cognitive Status: Within Functional Limits for tasks assessed                                        Exercises      General Comments        Pertinent Vitals/Pain Pain Assessment: Faces Faces Pain Scale: Hurts little more Pain Location: back Pain Intervention(s): Repositioned;Limited activity within patient's tolerance    Home Living                      Prior Function            PT Goals (current goals can now be found in the care plan section) Progress towards PT goals: Progressing toward goals    Frequency    Min 3X/week      PT Plan Current plan remains appropriate    Co-evaluation              AM-PAC PT "6 Clicks" Daily Activity  Outcome Measure  Difficulty turning over in bed (  including adjusting bedclothes, sheets and blankets)?: A Little Difficulty moving from lying on back to sitting on the side of the bed? : A Little Difficulty sitting down on and standing up from a chair with arms (e.g., wheelchair, bedside commode, etc,.)?: Unable Help needed moving to and from a bed to chair (including a wheelchair)?: A Little Help needed walking in hospital room?: A Little Help needed climbing 3-5 steps with a railing? : A Lot 6 Click Score: 15    End of Session Equipment Utilized During Treatment: Gait belt Activity Tolerance: Patient limited by fatigue Patient left: in bed;with call bell/phone within reach;with bed alarm set;with family/visitor present Nurse Communication: Mobility status PT Visit Diagnosis: Muscle weakness (generalized) (M62.81);Difficulty in walking, not elsewhere classified (R26.2)     Time: 0923-3007 PT Time  Calculation (min) (ACUTE ONLY): 15 min  Charges:  $Gait Training: 8-22 mins                    G Codes:      Carmelia Bake, PT, DPT 02/06/2017 Pager: [phone]  York Ram E 02/06/2017, 3:46 PM

## 2017-02-07 ENCOUNTER — Encounter (HOSPITAL_COMMUNITY): Payer: Self-pay

## 2017-02-07 ENCOUNTER — Inpatient Hospital Stay (HOSPITAL_COMMUNITY): Payer: Medicare Other | Admitting: Anesthesiology

## 2017-02-07 ENCOUNTER — Encounter (HOSPITAL_COMMUNITY)
Admission: EM | Disposition: A | Discharge status: Home Health | Payer: Self-pay | Source: Home / Self Care | Attending: Family Medicine

## 2017-02-07 HISTORY — PX: ESOPHAGOGASTRODUODENOSCOPY (EGD) WITH PROPOFOL: SHX5813

## 2017-02-07 LAB — URINE CULTURE

## 2017-02-07 LAB — GLUCOSE, CAPILLARY
GLUCOSE-CAPILLARY: 111 mg/dL — AB (ref 65–99)
GLUCOSE-CAPILLARY: 112 mg/dL — AB (ref 65–99)
Glucose-Capillary: 113 mg/dL — ABNORMAL HIGH (ref 65–99)
Glucose-Capillary: 79 mg/dL (ref 65–99)

## 2017-02-07 SURGERY — ESOPHAGOGASTRODUODENOSCOPY (EGD) WITH PROPOFOL
Anesthesia: Monitor Anesthesia Care

## 2017-02-07 MED ORDER — ONABOTULINUMTOXINA 100 UNITS IJ SOLR
INTRAMUSCULAR | Status: AC
  Filled 2017-02-07: qty 100

## 2017-02-07 MED ORDER — LACTATED RINGERS IV SOLN: INTRAVENOUS | Status: DC | PRN

## 2017-02-07 MED ORDER — PROPOFOL 10 MG/ML IV BOLUS
INTRAVENOUS | Status: DC | PRN
  Administered 2017-02-07: 20 mg via INTRAVENOUS

## 2017-02-07 MED ORDER — SODIUM CHLORIDE 0.9 % IV SOLN: INTRAVENOUS | Status: DC

## 2017-02-07 MED ORDER — MUSCLE RUB 10-15 % EX CREA
1.0000 "application " | TOPICAL_CREAM | CUTANEOUS | Status: DC | PRN
  Filled 2017-02-07: qty 85

## 2017-02-07 MED ORDER — SODIUM CHLORIDE 0.9 % IJ SOLN
INTRAMUSCULAR | Status: AC
  Filled 2017-02-07: qty 10

## 2017-02-07 MED ORDER — PROPOFOL 10 MG/ML IV BOLUS
INTRAVENOUS | Status: AC
  Filled 2017-02-07: qty 40

## 2017-02-07 MED ORDER — PROPOFOL 500 MG/50ML IV EMUL
INTRAVENOUS | Status: DC | PRN
Start: 2017-02-07 — End: 2017-02-07
  Administered 2017-02-07: 50 ug/kg/min via INTRAVENOUS

## 2017-02-07 MED ORDER — SODIUM CHLORIDE 0.9 % IJ SOLN
INTRAMUSCULAR | Status: DC | PRN
  Administered 2017-02-07: 4 mL via SUBMUCOSAL

## 2017-02-07 SURGICAL SUPPLY — 14 items

## 2017-02-07 NOTE — Progress Notes (Signed)
PT Cancellation Note  Patient Details Name: [name] [name] MRN: [id number] DOB: Mar 29, 1922   Cancelled Treatment:     Upper Endoscopy.  Pt has been evaluated with rec HH at [facility]  PTA [facility]  Acute  Rehab Pager      [phone]

## 2017-02-07 NOTE — Interval H&P Note (Signed)
History and Physical Interval Note:  02/07/2017 11:00 AM  [name] [name]  has presented today for surgery, with the diagnosis of Dysphagia, esophageal dysmotility  The various methods of treatment have been discussed with the patient and family. After consideration of risks, benefits and other options for treatment, the patient has consented to  Procedure(s): ESOPHAGOGASTRODUODENOSCOPY (EGD) WITH PROPOFOL (N/A) as a surgical intervention .  The patient's history has been reviewed, patient examined, no change in status, stable for surgery.  I have reviewed the patient's chart and labs.  Questions were answered to the patient's satisfaction.     Nelida Meuse III

## 2017-02-07 NOTE — Plan of Care (Signed)
  Clinical Measurements: Respiratory complications will improve 02/07/2017 2120 - Progressing by Ashley Murrain, RN   Nutrition: Adequate nutrition will be maintained 02/07/2017 2120 - Progressing by Ashley Murrain, RN

## 2017-02-07 NOTE — Anesthesia Preprocedure Evaluation (Addendum)
Anesthesia Evaluation  Patient identified by MRN, date of birth, ID band Patient awake    Reviewed: Allergy & Precautions, NPO status , Patient's Chart, lab work & pertinent test results  History of Anesthesia Complications Negative for: history of anesthetic complications  Airway Mallampati: II  TM Distance: >3 FB Neck ROM: Full    Dental  (+) Dental Advisory Given   Pulmonary COPD,  COPD inhaler, former smoker,    breath sounds clear to auscultation       Cardiovascular hypertension, Pt. on medications (-) angina+CHF  + pacemaker Cardiac Defibrillator: has never shocked pt.  Rhythm:Regular Rate:Normal  ECG: a-paced  2016 ECHO: 30% to 35%. Diffuse hypokinesis  Sees cardiologist Lovena Le)   Neuro/Psych negative neurological ROS  negative psych ROS   GI/Hepatic Neg liver ROS, GERD  Medicated and Controlled, Dysphagia, esophageal dysmotility   Endo/Other  Hypothyroidism   Renal/GU Renal InsufficiencyRenal disease (creat 1.64)     Musculoskeletal   Abdominal   Peds  Hematology  (+) Blood dyscrasia (Hb 9.9), anemia ,   Anesthesia Other Findings   Reproductive/Obstetrics                             Anesthesia Physical  Anesthesia Plan  ASA: IV  Anesthesia Plan: MAC   Post-op Pain Management:    Induction: Intravenous  PONV Risk Score and Plan: Treatment may vary due to age or medical condition and Propofol infusion  Airway Management Planned: Natural Airway  Additional Equipment:   Intra-op Plan:   Post-operative Plan:   Informed Consent: I have reviewed the patients History and Physical, chart, labs and discussed the procedure including the risks, benefits and alternatives for the proposed anesthesia with the patient or authorized representative who has indicated his/her understanding and acceptance.   Dental advisory given  Plan Discussed with: CRNA  Anesthesia Plan  Comments:         Anesthesia Quick Evaluation

## 2017-02-07 NOTE — Anesthesia Procedure Notes (Signed)
Date/Time: 02/07/2017 11:15 AM Performed by: Glory Buff, CRNA Oxygen Delivery Method: Nasal cannula

## 2017-02-07 NOTE — Transfer of Care (Signed)
Immediate Anesthesia Transfer of Care Note  Patient: [name] [name]  Procedure(s) Performed: ESOPHAGOGASTRODUODENOSCOPY (EGD) WITH PROPOFOL (N/A )  Patient Location: PACU  Anesthesia Type:MAC  Level of Consciousness: awake, alert  and oriented  Airway & Oxygen Therapy: Patient Spontanous Breathing and Patient connected to nasal cannula oxygen  Post-op Assessment: Report given to RN and Post -op Vital signs reviewed and stable  Post vital signs: Reviewed and stable  Last Vitals:  Vitals:   02/07/17 0748 02/07/17 1001  BP:  (!) 149/62  Pulse: 60 64  Resp: 18 14  Temp:  37.1 C  SpO2: 93% 95%    Last Pain:  Vitals:   02/07/17 1001  TempSrc: Oral  PainSc:       Patients Stated Pain Goal: 3 (84/13/24 4010)  Complications: No apparent anesthesia complications

## 2017-02-07 NOTE — Op Note (Signed)
[facility] Patient Name: [name] [name] Procedure Date: 02/07/2017 MRN: [id number] Attending MD: Estill Cotta. Loletha Carrow , MD Date of Birth: [birth year] CSN: [id number] Age: 83 Admit Type: Inpatient Procedure:                Upper GI endoscopy Indications:              Dysphagia Providers:                Mallie Mussel L. Loletha Carrow, MD, William Dalton, Technician,                            Carmie End, RN Referring MD:              Medicines:                Monitored Anesthesia Care Complications:            No immediate complications. Estimated Blood Loss:     Estimated blood loss: none. Procedure:                Pre-Anesthesia Assessment:                           - Prior to the procedure, a History and Physical                            was performed, and patient medications and                            allergies were reviewed. The patient's tolerance of                            previous anesthesia was also reviewed. The risks                            and benefits of the procedure and the sedation                            options and risks were discussed with the patient.                            All questions were answered, and informed consent                            was obtained. Prior Anticoagulants: The patient has                            taken no previous anticoagulant or antiplatelet                            agents. ASA Grade Assessment: III - A patient with                            severe systemic disease. After reviewing the risks  and benefits, the patient was deemed in                            satisfactory condition to undergo the procedure.                           After obtaining informed consent, the endoscope was                            passed under direct vision. Throughout the                            procedure, the patient's blood pressure, pulse, and                            oxygen saturations were  monitored continuously. The                            Endoscope was introduced through the mouth, and                            advanced to the second part of duodenum. The upper                            GI endoscopy was performed with moderate difficulty                            due to abnormal anatomy (very tortuous cervial                            esophagus). Scope In: Scope Out: Findings:      The cricopharyngeus and lower third of the esophagus were significantly       tortuous.      The lumen of the upper third of the esophagus and middle third of the       esophagus was moderately dilated.      Abnormal motility was noted in the esophagus. There are extra       peristaltic waves in the esophageal body. The distal esophagus/lower       esophageal sphincter is spastic, but gives up passage to the endoscope.       The area of the LES (2-4 cm above the SCJ) was successfully injected       with 100 units botulinum toxin in 4 quadrants.      The stomach was normal.      The cardia and gastric fundus were normal on retroflexion.      The examined duodenum was normal. Impression:               - Tortuous esophagus.                           - Dilation in the upper third of the esophagus and                            in the middle third of the esophagus.                           -  Abnormal esophageal motility. Injected with                            botulinum toxin.                           - Normal stomach.                           - Normal examined duodenum.                           - No specimens collected.                           This patient's dysphagia appears to b a complex                            scenario of diffuse, ill-defined motility disorder                            and tortuous cervical esophagus. The latter is                            exacerbated by poor posture/eating position after                            recent cervical spine fracture,  probable accuonting                            for recent worsening of symptoms. Therefore, it                            remains to be seen how much improvment there will                            be after this Botox treatment. Moderate Sedation:      MAC sedation used Recommendation:           - Return patient to hospital ward.                           - Resume previous diet.                           - Continue present medications. Procedure Code(s):        --- Professional ---                           [phone], Esophagogastroduodenoscopy, flexible,                            transoral; with directed submucosal injection(s),                            any substance Diagnosis Code(s):        --- Professional ---  Q39.9, Congenital malformation of esophagus,                            unspecified                           K22.8, Other specified diseases of esophagus                           K22.4, Dyskinesia of esophagus                           R13.10, Dysphagia, unspecified CPT copyright 2016 American Medical Association. All rights reserved. The codes documented in this report are preliminary and upon coder review may  be revised to meet current compliance requirements. [name] L. Loletha Carrow, MD 02/07/2017 11:55:04 AM This report has been signed electronically. Number of Addenda: 0

## 2017-02-07 NOTE — Progress Notes (Signed)
@IPLOG @        PROGRESS NOTE                                                                                                                                                                                                             Patient Demographics:    [name] [name], is a 83 y.o. male, DOB - 10-13-1922, EUM:[id number]  Admit date - 02/02/2017   Admitting Physician Rise Patience, MD  Outpatient Primary MD for the patient is Plotnikov, Evie Lacks, MD  LOS - 4  Chief Complaint  Patient presents with  . Chills  . Cough       Brief Narrative  [name] [name] is a 83 y.o. male with history of chronic systolic heart failure status post ICD placement, history of sinus node dysfunction status post pacemaker placement, history of B-cell lymphoma in remission, chronic kidney disease stage IV, chronic anemia hypothyroidism cirrhosis of liver, hypertension presents to the ER for the second time in 48 hours for fever and chills.  Patient has come to the ER 2 days ago with dysuria shortness of breath and fever chills.  At that time patient was diagnosed with UTI chest x-ray was unremarkable and patient was discharged back on antibiotics.    He was admitted for URI, mild acute on chronic systolic CHF, also showed signs of dysphagia for which GI has been consulted as initial studies suggest he might have underlying achalasia as well. Botox was injected 11/16 empirically.    Subjective:   No complaints this morning. Ate a little yesterday with some coughing but no emesis. Spit pills that could not be crushed back out after swallowing.   Assessment  & Plan :   Staphylococcus lugdunensis UTI: - Continue ceftriaxone based on pan-sensitivity on culture, including oxacillin sensitivity.   Fever, chills, cough: Presumptive diagnosis of early pneumonia without CXR infiltrate ?atypical.  - Continue ceftriaxone as above and added azithromycin (final of 5 doses today), plan to complete 7 days of  therapy.   - Continue bronchodilators, mucomyst, flutter valve  Dysphagia: Multifactorial, due to ill-defined esophageal motility disorder, worsened by poor posture following cervical spine fracture. Severe distal esophageal spasms and oropharyngeal reflux on barium swallow. Esophageal manometry testing was nondiagnostic. - s/p botox injection 11/16, monitor intake with soft diet and continue SLP.  - Continue PPI - Was started on nifedipine TIDAC, though I would recommend caution with this given LV dysfunction.  Mild acute on chronic systolic CHF EF 54-00%.  Has AICD, followed by Dr. Lovena Le. -  Appears euvolemic. Will hold diuretics and fluids for now (no diuretics on home med list) - Continue long-acting nitrate. Not on BB or ACE/ARB due to hypotension.   Idiopathic hepatic cirrhosis: Compensated - Follow up with Dr. Oneida Alar as outpatient.  Frequent falls with non-displaced C1 ring fracture:  - Conservative management, requiring opioids for pain control.  - Recommend DC back to ILF with 24-hr supervision and significant home health assistance (PT, OT, aide, SLP)  BPH:  - Continue flomax.  Hypothyroidism.   - Synthroid 112 mcg continued - TSH borderline elevated at 4.962, repeat TSH in 4-6 weeks.  Mild dementia:  - Continue aricept - Delirium precautions reviewed with pt and family.   History of gastric lymphoma: In remission, followed by Dr. Alvy Bimler. No masses on EGD 11/13.   Dyslipidemia:  - Continue statin  Stage IV CKD: Baseline SCr ~1.6.  - Monitor intermittently as this has been stable at baseline.  Anemia of chronic disease: - Stable no acute issues.  Diet : DIET SOFT Room service appropriate? Yes; Fluid consistency: Thin    Family Communication:  Daughter, Son and DIL at bedside this AM  Code Status:  Full  Disposition Plan:   DC back to ILF with 24-hr supervision and significant home health assistance (PT, OT, aide, SLP)  Consults: [facility]  Procedures:     Esophagogram showing changes suggestive of achalasia or esophageal spasm.  EGD 02/04/2017 EGD with botox injection 02/07/2017 showing tortuous esophagus with dilatation of upper and middle thirds.   DVT Prophylaxis  :  SCDs  Inpatient Medications  Scheduled Meds: . atorvastatin  10 mg Oral BID  . calcium carbonate  500 mg of elemental calcium Oral BID WC  . cholecalciferol  2,000 Units Oral Daily  . donepezil  10 mg Oral QHS  . ipratropium-albuterol  3 mL Nebulization BID  . isosorbide dinitrate  5 mg Oral TID  . levothyroxine  112 mcg Oral QAC breakfast  . multivitamin with minerals  1 tablet Oral Daily  . NIFEdipine  10 mg Oral TID AC  . nitroGLYCERIN  1 spray Sublingual TID AC  . pantoprazole  40 mg Oral Daily  . tamsulosin  0.4 mg Oral QPM  . triamcinolone  2 spray Nasal Daily  . triamcinolone ointment  1 application Topical BID   Continuous Infusions: . azithromycin    . cefTRIAXone (ROCEPHIN)  IV Stopped (02/06/17 2159)   PRN Meds:.acetaminophen **OR** [DISCONTINUED] acetaminophen, albuterol, docusate sodium, fentaNYL (SUBLIMAZE) injection, hydrALAZINE, MUSCLE RUB, nitroGLYCERIN, [DISCONTINUED] ondansetron **OR** ondansetron (ZOFRAN) IV  Antibiotics  :    Anti-infectives (From admission, onward)   Start     Dose/Rate Route Frequency Ordered Stop   02/07/17 1400  azithromycin (ZITHROMAX) 500 mg in dextrose 5 % 250 mL IVPB     500 mg 250 mL/hr over 60 Minutes Intravenous Every 24 hours 02/06/17 1408 02/08/17 1359   02/06/17 2000  cefTRIAXone (ROCEPHIN) 1 g in dextrose 5 % 50 mL IVPB     1 g 100 mL/hr over 30 Minutes Intravenous Every 24 hours 02/06/17 1408 02/10/17 1959   02/03/17 2000  cefTRIAXone (ROCEPHIN) 1 g in dextrose 5 % 50 mL IVPB  Status:  Discontinued     1 g 100 mL/hr over 30 Minutes Intravenous Every 24 hours 02/03/17 1104 02/06/17 1408   02/03/17 1400  azithromycin (ZITHROMAX) 500 mg in dextrose 5 % 250 mL IVPB  Status:  Discontinued     500 mg 250  mL/hr over 60 Minutes  Intravenous Every 24 hours 02/03/17 1239 02/06/17 1408   02/02/17 2030  cefTRIAXone (ROCEPHIN) 1 g in dextrose 5 % 50 mL IVPB     1 g 100 mL/hr over 30 Minutes Intravenous  Once 02/02/17 2019 02/02/17 2117         Objective:   Vitals:   02/07/17 1153 02/07/17 1157 02/07/17 1200 02/07/17 1228  BP: (!) 86/49 (!) 110/49 (!) 120/57 137/71  Pulse: 67 61 62 65  Resp: 17 17 (!) 26 (!) 22  Temp: 98.3 F (36.8 C)   97.9 F (36.6 C)  TempSrc: Oral   Oral  SpO2: 95% 95% 93% 96%  Weight:      Height:        BP 137/71 (BP Location: Left Arm)   Pulse 65   Temp 97.9 F (36.6 C) (Oral)   Resp (!) 22   Ht 5\' 4"  (1.626 m)   Wt 67.6 kg (149 lb)   SpO2 96%   BMI 25.58 kg/m  Gen: Pleasant elderly male in NAD HEENT: MMM, posterior oropharynx clear Pulm: Non-labored, much more clear this morning without rhonchi/wheezes or crackles.  CV: Regular rate, no murmur appreciated; distal pulses intact/symmetric GI: + BS; soft, non-tender, non-distended Skin: No rashes, wounds, ulcers Neuro: Alert, conversant and oriented this morning. No focal deficits.   Data Review:    CBC Recent Labs  Lab 02/01/17 1231 02/02/17 1826 02/03/17 0237 02/04/17 0245 02/05/17 0452  WBC 9.6 12.9* 10.3 9.4 10.6*  HGB 11.2* 12.5* 10.7* 9.9* 11.3*  HCT 33.0* 36.5* 31.7* 29.8* 33.7*  PLT 193 197 170 174 195  MCV 92.2 92.9 93.5 93.7 93.4  MCH 31.3 31.8 31.6 31.1 31.3  MCHC 33.9 34.2 33.8 33.2 33.5  RDW 15.2 15.3 15.2 15.1 15.2  LYMPHSABS  --  1.4  --   --   --   MONOABS  --  2.0*  --   --   --   EOSABS  --  0.5  --   --   --   BASOSABS  --  0.0  --   --   --     Chemistries  Recent Labs  Lab 02/01/17 1231 02/02/17 1826 02/03/17 0237 02/04/17 0245 02/05/17 0452  NA 130* 128* 130* 132* 137  K 4.3 5.1 4.4 4.1 4.2  CL 97* 96* 104 104 106  CO2 21* 21* 20* 20* 22  GLUCOSE 111* 107* 95 93 82  BUN 38* 36* 31* 32* 29*  CREATININE 1.89* 1.68* 1.48* 1.64* 1.56*  CALCIUM 8.7* 9.0  8.1* 8.2* 8.9  MG  --   --   --  1.6*  --   AST  --  29  --   --   --   ALT  --  19  --   --   --   ALKPHOS  --  383*  --   --   --   BILITOT  --  0.8  --   --   --    Micro Results Recent Results (from the past 240 hour(s))  Urine culture     Status: Abnormal   Collection Time: 02/01/17  2:39 PM  Result Value Ref Range Status   Specimen Description URINE, RANDOM  Final   Special Requests NONE  Final   Culture (A)  Final    50,000 COLONIES/mL STAPHYLOCOCCUS SPECIES (COAGULASE NEGATIVE)   Report Status 02/07/2017 FINAL  Final   Organism ID, Bacteria STAPHYLOCOCCUS SPECIES (COAGULASE NEGATIVE) (A)  Final  Susceptibility   Staphylococcus species (coagulase negative) - MIC*    CIPROFLOXACIN <=0.5 SENSITIVE Sensitive     GENTAMICIN <=0.5 SENSITIVE Sensitive     NITROFURANTOIN <=16 SENSITIVE Sensitive     OXACILLIN 0.5 SENSITIVE Sensitive     TETRACYCLINE <=1 SENSITIVE Sensitive     VANCOMYCIN <=0.5 SENSITIVE Sensitive     TRIMETH/SULFA <=10 SENSITIVE Sensitive     CLINDAMYCIN <=0.25 SENSITIVE Sensitive     RIFAMPIN <=0.5 SENSITIVE Sensitive     Inducible Clindamycin NEGATIVE Sensitive     * 50,000 COLONIES/mL STAPHYLOCOCCUS SPECIES (COAGULASE NEGATIVE)  Urine culture     Status: Abnormal   Collection Time: 02/02/17  6:28 PM  Result Value Ref Range Status   Specimen Description URINE, RANDOM  Final   Special Requests NONE  Final   Culture 60,000 COLONIES/mL STAPHYLOCOCCUS LUGDUNENSIS (A)  Final   Report Status 02/05/2017 FINAL  Final   Organism ID, Bacteria STAPHYLOCOCCUS LUGDUNENSIS (A)  Final      Susceptibility   Staphylococcus lugdunensis - MIC*    CIPROFLOXACIN <=0.5 SENSITIVE Sensitive     GENTAMICIN <=0.5 SENSITIVE Sensitive     NITROFURANTOIN <=16 SENSITIVE Sensitive     OXACILLIN 1 SENSITIVE Sensitive     TETRACYCLINE <=1 SENSITIVE Sensitive     VANCOMYCIN <=0.5 SENSITIVE Sensitive     TRIMETH/SULFA <=10 SENSITIVE Sensitive     CLINDAMYCIN <=0.25 SENSITIVE  Sensitive     RIFAMPIN <=0.5 SENSITIVE Sensitive     Inducible Clindamycin NEGATIVE Sensitive     * 60,000 COLONIES/mL STAPHYLOCOCCUS LUGDUNENSIS  Blood culture (routine x 2)     Status: None (Preliminary result)   Collection Time: 02/02/17  7:27 PM  Result Value Ref Range Status   Specimen Description BLOOD LEFT FOREARM  Final   Special Requests   Final    BOTTLES DRAWN AEROBIC AND ANAEROBIC Blood Culture adequate volume   Culture   Final    NO GROWTH 4 DAYS Performed at [facility], [address], [place], [place] [address]    Report Status PENDING  Incomplete  Blood culture (routine x 2)     Status: None (Preliminary result)   Collection Time: 02/02/17  8:40 PM  Result Value Ref Range Status   Specimen Description BLOOD RIGHT HAND  Final   Special Requests   Final    BOTTLES DRAWN AEROBIC AND ANAEROBIC Blood Culture adequate volume   Culture   Final    NO GROWTH 4 DAYS Performed at [facility], [place] [address], [place], [place] [address]    Report Status PENDING  Incomplete  MRSA PCR Screening     Status: None   Collection Time: 02/03/17  5:14 AM  Result Value Ref Range Status   MRSA by PCR NEGATIVE NEGATIVE Final    Comment:        The GeneXpert MRSA Assay (FDA approved for NASAL specimens only), is one component of a comprehensive MRSA colonization surveillance program. It is not intended to diagnose MRSA infection nor to guide or monitor treatment for MRSA infections.   Culture, expectorated sputum-assessment     Status: None   Collection Time: 02/03/17  9:30 AM  Result Value Ref Range Status   Specimen Description EXPECTORATED SPUTUM  Final   Special Requests NONE  Final   Sputum evaluation   Final    Sputum specimen not acceptable for testing.  Please recollect.   INFORMED DUNKLEBERGER,C. RN @1147  ON 11.12.18 BY NMCCOY    Report Status  02/03/2017 FINAL  Final    Radiology Reports Dg Chest 2 View  Result Date: 02/01/2017 CLINICAL DATA:   Increasingly short of breath since fall 3 weeks ago. EXAM: CHEST  2 VIEW COMPARISON:  05/15/2015 FINDINGS: The heart remains normal in size. Aorta rib is tortuous. Mediastinum is rotated to the right limiting evaluation of the mediastinum. Lungs are under aerated. Chronic interstitial changes. Calcified granulomata at the right lung base. Stable thoracic spine. Osteopenia. No pneumothorax or pleural effusion. AICD device is unchanged via left subclavian vein. Chronic sternal deformity. IMPRESSION: No active cardiopulmonary disease. Electronically Signed   By: Marybelle Killings M.D.   On: 02/01/2017 11:54   Dg Ribs Unilateral Left  Result Date: 02/01/2017 CLINICAL DATA:  Increasing SOB since fall 3 weeks ago; left lower rib pain x 3 weeks; hx gastric CA, CHF, HTN; ex smoker quit 40 years ago; EXAM: LEFT RIBS - 2 VIEW COMPARISON:  Current chest radiograph. Previous chest radiograph, 05/15/2015. FINDINGS: No rib fracture or rib lesion.  Bony thorax is demineralized. No left lung consolidation. No pleural effusion or evidence of a pneumothorax. IMPRESSION: No rib fracture or rib lesion. Electronically Signed   By: Lajean Manes M.D.   On: 02/01/2017 11:50    Dg Chest Port 1 View  Result Date: 02/02/2017 CLINICAL DATA:  83 year old male with cough and fever. EXAM: PORTABLE CHEST 1 VIEW COMPARISON:  Chest radiograph dated 02/01/2017 FINDINGS: There is shallow inspiration with bibasilar linear atelectasis/ scarring. Stable calcified granuloma at the right lung base. No focal consolidation, pleural effusion, or pneumothorax. The cardiac silhouette is within normal limits. There is atherosclerotic calcification of the aorta. Left pectoral dual lead AICD device. There is osteopenia with degenerative changes of the spine. No acute osseous pathology. IMPRESSION: No active disease. Electronically Signed   By: Anner Crete M.D.   On: 02/02/2017 19:10    Time Spent:  25 minutes   Vance Gather M.D on 02/07/2017 at  12:35 PM  Between 7am to 7pm - Pager - [phone] (page via [email or web address], text pages only, please mention full 10 digit call back number). After 7pm go to [email or web address] - password [email or web address]

## 2017-02-07 NOTE — Clinical Social Work Note (Signed)
Clinical Social Work Assessment  Patient Details  Name: [name] [name] MRN: [id number] Date of Birth: 1922-06-20  Date of referral:  02/07/17               Reason for consult:  [employer], Discharge Planning                Permission sought to share information with:  Family Supports Permission granted to share information::  Yes, Verbal Permission Granted  Name::     son Delfino Lovett, daughter in law Butch Penny, daughter Ivin Booty  Agency::     Relationship::     Contact Information:     Housing/Transportation Living arrangements for the past 2 months:  Charity fundraiser of Information:  Patient, Adult Children Patient Interpreter Needed:  None Criminal Activity/Legal Involvement Pertinent to Current Situation/Hospitalization:  No - Comment as needed Significant Relationships:  Adult Children, Warehouse manager, Friend, Industrial/product designer Lives with:  Self, Facility Resident Do you feel safe going back to the place where you live?  Yes Need for family participation in patient care:  Yes (Comment)(family involved)  Care giving concerns:  Pt from independent living community- gets home health currently but due to need for supervision for safety at DC is inquiring about how to obtain caregivers.    Social Worker assessment / plan:  CSW consulted to assist with Assisted Living placement questions. Met with pt and his son and daughter in law at bedside. They explain that pt used to live in an assisted living [facility]) in the past but was not happy there and has lived in independent living community [facility]) for past year. They explain that they are planning at DC for pt to return to [facility] services and hiring caretakers to assist with supervision during first few days/weeks after hospitalization. Family explains that they are wanting to look into ALF placement if the need for assistance and supervision becomes long term as opposed to  temporary. CSW provided family with area ALF resources and explained the differing levels of care (IL, ALF, SNF). Pt's family somewhat familiar as pt has been in SNF and ALF previously. They mention being interested in [facility] due to having multiple levels of care there, and CSW provided them with contact information after speaking with admissions. Pt notes that currently he feels comfortable at [facility] but that they are beginning to anticipate ALF being more appropriate in the future.  Plan: returning home to independent living apartment at [place] with home health and caretakers.   Employment status:  Retired Health visitor PT Recommendations:  Home with [place], [place] / Referral to community resources:     Patient/Family's Response to care:  Appreciative and engaged in care  Patient/Family's Understanding of and Emotional Response to Diagnosis, Current Treatment, and Prognosis:  Demonstrates adequate understanding of treatment and plan.   Emotional Assessment Appearance:  Appears stated age Attitude/Demeanor/Rapport:  (pleasant) Affect (typically observed):  Calm, Accepting, Adaptable Orientation:  Oriented to Situation, Oriented to  Time, Oriented to Place, Oriented to Self Alcohol / Substance use:  Not Applicable Psych involvement (Current and /or in the community):  No (Comment)  Discharge Needs  Concerns to be addressed:  Discharge Planning Concerns Readmission within the last 30 days:  No Current discharge risk:  None Barriers to Discharge:  Continued Medical Work up   [employer], LCSW 02/07/2017, 1:29 PM  [phone]

## 2017-02-08 ENCOUNTER — Inpatient Hospital Stay (HOSPITAL_COMMUNITY): Payer: Medicare Other

## 2017-02-08 ENCOUNTER — Telehealth: Payer: Self-pay

## 2017-02-08 DIAGNOSIS — R509 Fever, unspecified: Secondary | ICD-10-CM

## 2017-02-08 LAB — BASIC METABOLIC PANEL
ANION GAP: 11 (ref 5–15)
BUN: 34 mg/dL — ABNORMAL HIGH (ref 6–20)
CALCIUM: 8.8 mg/dL — AB (ref 8.9–10.3)
CO2: 23 mmol/L (ref 22–32)
Chloride: 100 mmol/L — ABNORMAL LOW (ref 101–111)
Creatinine, Ser: 1.79 mg/dL — ABNORMAL HIGH (ref 0.61–1.24)
GFR, EST AFRICAN AMERICAN: 36 mL/min — AB (ref >60)
GFR, EST NON AFRICAN AMERICAN: 31 mL/min — AB (ref >60)
Glucose, Bld: 127 mg/dL — ABNORMAL HIGH (ref 65–99)
Potassium: 4 mmol/L (ref 3.5–5.1)
SODIUM: 134 mmol/L — AB (ref 135–145)

## 2017-02-08 LAB — URINALYSIS, ROUTINE W REFLEX MICROSCOPIC
BILIRUBIN URINE: NEGATIVE
Glucose, UA: NEGATIVE mg/dL
KETONES UR: NEGATIVE mg/dL
NITRITE: NEGATIVE
PROTEIN: 100 mg/dL — AB
Specific Gravity, Urine: 1.02 (ref 1.005–1.030)
pH: 6 (ref 5.0–8.0)

## 2017-02-08 LAB — URINALYSIS, MICROSCOPIC (REFLEX): Squamous Epithelial / LPF: NONE SEEN

## 2017-02-08 LAB — CBC
HCT: 38.8 % — ABNORMAL LOW (ref 39.0–52.0)
Hemoglobin: 13.2 g/dL (ref 13.0–17.0)
MCH: 31.9 pg (ref 26.0–34.0)
MCHC: 34 g/dL (ref 30.0–36.0)
MCV: 93.7 fL (ref 78.0–100.0)
PLATELETS: 207 10*3/uL (ref 150–400)
RBC: 4.14 MIL/uL — ABNORMAL LOW (ref 4.22–5.81)
RDW: 15.1 % (ref 11.5–15.5)
WBC: 14.8 10*3/uL — AB (ref 4.0–10.5)

## 2017-02-08 LAB — CULTURE, BLOOD (ROUTINE X 2)
Culture: NO GROWTH
Culture: NO GROWTH
Special Requests: ADEQUATE
Special Requests: ADEQUATE

## 2017-02-08 LAB — GLUCOSE, CAPILLARY
GLUCOSE-CAPILLARY: 90 mg/dL (ref 65–99)
Glucose-Capillary: 94 mg/dL (ref 65–99)
Glucose-Capillary: 119 mg/dL — ABNORMAL HIGH (ref 65–99)

## 2017-02-08 MED ORDER — OXYCODONE HCL 5 MG PO TABS
2.5000 mg | ORAL_TABLET | ORAL | Status: DC | PRN
  Administered 2017-02-08: 2.5 mg via ORAL
  Filled 2017-02-08: qty 1

## 2017-02-08 MED ORDER — LIDOCAINE 5 % EX PTCH
1.0000 | MEDICATED_PATCH | CUTANEOUS | Status: DC
  Administered 2017-02-08 – 2017-02-09 (×2): 1 via TRANSDERMAL
  Filled 2017-02-08 (×2): qty 1

## 2017-02-08 NOTE — Progress Notes (Addendum)
PROGRESS NOTE    [name] [name]  WRU:[id number] DOB: 04/05/1922 DOA: 02/02/2017 PCP: Cassandria Anger, MD   Brief Narrative:  [name] [name] a 83 y.o.malewithhistory of chronic systolic heart failure status post ICD placement, history of sinus node dysfunction status post pacemaker placement, history of B-cell lymphoma in remission, chronic kidney disease stage IV, chronic anemia hypothyroidism cirrhosis of liver, hypertension presents to the ER for the second time in 48 hours for fever and chills. Patient has come to the ER 2 days ago with dysuria shortness of breath and fever chills. At that time patient was diagnosed with UTI chest x-ray was unremarkable and patient was discharged back on antibiotics.   He was admitted for URI, mild acute on chronic systolic CHF, also showed signs of dysphagia for which GI has been consulted as initial studies suggest he might have underlying achalasia as well. Botox was injected 11/16 empirically.   Assessment & Plan:   Active Problems:   Hypothyroidism   Essential hypertension   Automatic implantable cardioverter-defibrillator in situ   Anemia in chronic renal disease   History of B-cell lymphoma   Chronic combined systolic and diastolic CHF (congestive heart failure) (HCC)   CKD (chronic kidney disease) stage 3, GFR 30-59 ml/min (HCC)   UTI (urinary tract infection)   Liver cirrhosis (HCC)   Hyponatremia   Dysphagia   Abnormality of esophagus   Staphylococcus lugdunensis UTI: - Continue ceftriaxone (11/11- ) based on pan-sensitivity on culture, including oxacillin sensitivity.   Fever, chills, cough: Presumptive diagnosis of early pneumonia without CXR infiltrate ?atypical.  - Continue ceftriaxone as above and added azithromycin (final of 5 doses yesterday), plan to complete 7 days of therapy.   - Continue bronchodilators, mucomyst, flutter valve [ ]  repeat labs, CXR, urine and blood cx with recurrent fever last night  11/16.  Dysphagia: Multifactorial, due to ill-defined esophageal motility disorder, worsened by poor posture following cervical spine fracture. Severe distal esophageal spasms and oropharyngeal reflux on barium swallow. Esophageal manometry testing was nondiagnostic. - s/p botox injection 11/16, monitor intake with soft diet and continue SLP.  - Continue PPI - Was started on nifedipine TIDAC, though I would recommend caution with this given LV dysfunction.  Mild acute on chronic systolic CHF EF 91-47%.  Has AICD, followed by Dr. Lovena Le. - Appears euvolemic. Will hold diuretics and fluids for now (no diuretics on home med list) - Continue long-acting nitrate. Not on BB or ACE/ARB due to hypotension.   Idiopathic hepatic cirrhosis: Compensated - Follow up with Dr. Oneida Alar as outpatient.  Frequent falls with non-displaced C1 ring fracture:  [ ]  x-ray of L shoulder with pain here today - Conservative management, requiring opioids for pain control.  - Recommend DC back to ILF with 24-hr supervision and significant home health assistance (PT, OT, aide, SLP)  BPH:  - Continue flomax.  Hypothyroidism.   - Synthroid 112 mcg continued - TSH borderline elevated at 4.962, repeat TSH in 4-6 weeks.  Mild dementia:  - Continue aricept - Delirium precautions reviewed with pt and family.   History of gastric lymphoma: In remission, followed by Dr. Alvy Bimler. No masses on EGD 11/13.   Dyslipidemia:  - Continue statin  Stage IV CKD: Baseline SCr ~1.6.  - Monitor intermittently as this has been stable at baseline.  Anemia of chronic disease: - Stable no acute issues.  DVT prophylaxis: SCD Code Status: full  Family Communication: daughter at bedside Disposition Plan: pending improvement.  DC back to ILF  with 24-hr supervision and significant home health assistance (PT, OT, aide, SLP)  Consultants:   GI  Procedures: (Don't include imaging studies which can be auto populated.  Include things that cannot be auto populated i.e. Echo, Carotid and venous dopplers, Foley, Bipap, HD, tubes/drains, wound vac, central lines etc)  Esophagogram showing changes suggestive of achalasia or esophageal spasm.  EGD 02/04/2017 EGD with botox injection 02/07/2017 showing tortuous esophagus with dilatation of upper and middle thirds.     Antimicrobials: (specify start and planned stop date. Auto populated tables are space occupying and do not give end dates) Anti-infectives (From admission, onward)   Start     Dose/Rate Route Frequency Ordered Stop   02/07/17 1400  azithromycin (ZITHROMAX) 500 mg in dextrose 5 % 250 mL IVPB     500 mg 250 mL/hr over 60 Minutes Intravenous Every 24 hours 02/06/17 1408 02/07/17 1559   02/06/17 2000  cefTRIAXone (ROCEPHIN) 1 g in dextrose 5 % 50 mL IVPB     1 g 100 mL/hr over 30 Minutes Intravenous Every 24 hours 02/06/17 1408 02/10/17 1959   02/03/17 2000  cefTRIAXone (ROCEPHIN) 1 g in dextrose 5 % 50 mL IVPB  Status:  Discontinued     1 g 100 mL/hr over 30 Minutes Intravenous Every 24 hours 02/03/17 1104 02/06/17 1408   02/03/17 1400  azithromycin (ZITHROMAX) 500 mg in dextrose 5 % 250 mL IVPB  Status:  Discontinued     500 mg 250 mL/hr over 60 Minutes Intravenous Every 24 hours 02/03/17 1239 02/06/17 1408   02/02/17 2030  cefTRIAXone (ROCEPHIN) 1 g in dextrose 5 % 50 mL IVPB     1 g 100 mL/hr over 30 Minutes Intravenous  Once 02/02/17 2019 02/02/17 2117      Subjective: Breathing is about the same. Still some irritation with urination.  Otherwise, has shoulder pain which he thinks is from irritation after being told to hold certain positions during procedues.   Objective: Vitals:   02/07/17 2234 02/08/17 0540 02/08/17 0831 02/08/17 0836  BP:  (!) 144/66    Pulse:  63    Resp:  20    Temp: 99.2 F (37.3 C) 98.4 F (36.9 C)    TempSrc: Oral Oral    SpO2:  92% 96% 96%  Weight:      Height:        Intake/Output Summary (Last 24  hours) at 02/08/2017 1302 Last data filed at 02/08/2017 1121 Gross per 24 hour  Intake 490 ml  Output 1075 ml  Net -585 ml   Filed Weights   02/02/17 2251 02/04/17 1003 02/07/17 1001  Weight: 67.7 kg (149 lb 4 oz) 67.7 kg (149 lb 4 oz) 67.6 kg (149 lb)    Examination:  General exam: Appears calm and comfortable  Respiratory system: Clear to auscultation. Respiratory effort normal. Cardiovascular system: S1 & S2 heard, RRR. No JVD, murmurs, rubs, gallops or clicks. No pedal edema. Gastrointestinal system: Abdomen is nondistended, soft and nontender. No organomegaly or masses felt. Normal bowel sounds heard. Central nervous system: Alert and oriented. No focal neurological deficits. Extremities: TTP to L shoulder Skin: No rashes, lesions or ulcers Psychiatry: Judgement and insight appear normal. Mood & affect appropriate.     Data Reviewed: I have personally reviewed following labs and imaging studies  CBC: Recent Labs  Lab 02/02/17 1826 02/03/17 0237 02/04/17 0245 02/05/17 0452 02/08/17 1022  WBC 12.9* 10.3 9.4 10.6* 14.8*  NEUTROABS 9.0*  --   --   --   --  HGB 12.5* 10.7* 9.9* 11.3* 13.2  HCT 36.5* 31.7* 29.8* 33.7* 38.8*  MCV 92.9 93.5 93.7 93.4 93.7  PLT 197 170 174 195 710   Basic Metabolic Panel: Recent Labs  Lab 02/02/17 1826 02/03/17 0237 02/04/17 0245 02/05/17 0452 02/08/17 1022  NA 128* 130* 132* 137 134*  K 5.1 4.4 4.1 4.2 4.0  CL 96* 104 104 106 100*  CO2 21* 20* 20* 22 23  GLUCOSE 107* 95 93 82 127*  BUN 36* 31* 32* 29* 34*  CREATININE 1.68* 1.48* 1.64* 1.56* 1.79*  CALCIUM 9.0 8.1* 8.2* 8.9 8.8*  MG  --   --  1.6*  --   --    GFR: Estimated Creatinine Clearance: 22 mL/min (A) (by C-G formula based on SCr of 1.79 mg/dL (H)). Liver Function Tests: Recent Labs  Lab 02/02/17 1826  AST 29  ALT 19  ALKPHOS 383*  BILITOT 0.8  PROT 7.3  ALBUMIN 3.6   No results for input(s): LIPASE, AMYLASE in the last 168 hours. No results for input(s):  AMMONIA in the last 168 hours. Coagulation Profile: No results for input(s): INR, PROTIME in the last 168 hours. Cardiac Enzymes: Recent Labs  Lab 02/03/17 0237 02/03/17 0815  CKTOTAL  --  39  TROPONINI <0.03 <0.03   BNP (last 3 results) No results for input(s): PROBNP in the last 8760 hours. HbA1C: No results for input(s): HGBA1C in the last 72 hours. CBG: Recent Labs  Lab 02/07/17 1230 02/07/17 1756 02/08/17 0000 02/08/17 0657 02/08/17 1221  GLUCAP 79 111* 112* 90 94   Lipid Profile: No results for input(s): CHOL, HDL, LDLCALC, TRIG, CHOLHDL, LDLDIRECT in the last 72 hours. Thyroid Function Tests: No results for input(s): TSH, T4TOTAL, FREET4, T3FREE, THYROIDAB in the last 72 hours. Anemia Panel: No results for input(s): VITAMINB12, FOLATE, FERRITIN, TIBC, IRON, RETICCTPCT in the last 72 hours. Sepsis Labs: Recent Labs  Lab 02/02/17 1934 02/03/17 2244 02/04/17 0245  LATICACIDVEN 0.94 0.9 0.7    Recent Results (from the past 240 hour(s))  Urine culture     Status: Abnormal   Collection Time: 02/01/17  2:39 PM  Result Value Ref Range Status   Specimen Description URINE, RANDOM  Final   Special Requests NONE  Final   Culture (A)  Final    50,000 COLONIES/mL STAPHYLOCOCCUS SPECIES (COAGULASE NEGATIVE)   Report Status 02/07/2017 FINAL  Final   Organism ID, Bacteria STAPHYLOCOCCUS SPECIES (COAGULASE NEGATIVE) (A)  Final      Susceptibility   Staphylococcus species (coagulase negative) - MIC*    CIPROFLOXACIN <=0.5 SENSITIVE Sensitive     GENTAMICIN <=0.5 SENSITIVE Sensitive     NITROFURANTOIN <=16 SENSITIVE Sensitive     OXACILLIN 0.5 SENSITIVE Sensitive     TETRACYCLINE <=1 SENSITIVE Sensitive     VANCOMYCIN <=0.5 SENSITIVE Sensitive     TRIMETH/SULFA <=10 SENSITIVE Sensitive     CLINDAMYCIN <=0.25 SENSITIVE Sensitive     RIFAMPIN <=0.5 SENSITIVE Sensitive     Inducible Clindamycin NEGATIVE Sensitive     * 50,000 COLONIES/mL STAPHYLOCOCCUS SPECIES (COAGULASE  NEGATIVE)  Urine culture     Status: Abnormal   Collection Time: 02/02/17  6:28 PM  Result Value Ref Range Status   Specimen Description URINE, RANDOM  Final   Special Requests NONE  Final   Culture 60,000 COLONIES/mL STAPHYLOCOCCUS LUGDUNENSIS (A)  Final   Report Status 02/05/2017 FINAL  Final   Organism ID, Bacteria STAPHYLOCOCCUS LUGDUNENSIS (A)  Final      Susceptibility  Staphylococcus lugdunensis - MIC*    CIPROFLOXACIN <=0.5 SENSITIVE Sensitive     GENTAMICIN <=0.5 SENSITIVE Sensitive     NITROFURANTOIN <=16 SENSITIVE Sensitive     OXACILLIN 1 SENSITIVE Sensitive     TETRACYCLINE <=1 SENSITIVE Sensitive     VANCOMYCIN <=0.5 SENSITIVE Sensitive     TRIMETH/SULFA <=10 SENSITIVE Sensitive     CLINDAMYCIN <=0.25 SENSITIVE Sensitive     RIFAMPIN <=0.5 SENSITIVE Sensitive     Inducible Clindamycin NEGATIVE Sensitive     * 60,000 COLONIES/mL STAPHYLOCOCCUS LUGDUNENSIS  Blood culture (routine x 2)     Status: None (Preliminary result)   Collection Time: 02/02/17  7:27 PM  Result Value Ref Range Status   Specimen Description BLOOD LEFT FOREARM  Final   Special Requests   Final    BOTTLES DRAWN AEROBIC AND ANAEROBIC Blood Culture adequate volume   Culture   Final    NO GROWTH 4 DAYS Performed at [facility], [address] [place], [place] [address]    Report Status PENDING  Incomplete  Blood culture (routine x 2)     Status: None (Preliminary result)   Collection Time: 02/02/17  8:40 PM  Result Value Ref Range Status   Specimen Description BLOOD RIGHT HAND  Final   Special Requests   Final    BOTTLES DRAWN AEROBIC AND ANAEROBIC Blood Culture adequate volume   Culture   Final    NO GROWTH 4 DAYS Performed at [facility], [place] [address] [place], [place] [address]    Report Status PENDING  Incomplete  MRSA PCR Screening     Status: None   Collection Time: 02/03/17  5:14 AM  Result Value Ref Range Status   MRSA by PCR NEGATIVE NEGATIVE Final    Comment:         The GeneXpert MRSA Assay (FDA approved for NASAL specimens only), is one component of a comprehensive MRSA colonization surveillance program. It is not intended to diagnose MRSA infection nor to guide or monitor treatment for MRSA infections.   Culture, expectorated sputum-assessment     Status: None   Collection Time: 02/03/17  9:30 AM  Result Value Ref Range Status   Specimen Description EXPECTORATED SPUTUM  Final   Special Requests NONE  Final   Sputum evaluation   Final    Sputum specimen not acceptable for testing.  Please recollect.   INFORMED DUNKLEBERGER,C. RN @1147  ON 11.12.18 BY [facility]    Report Status 02/03/2017 FINAL  Final         Radiology Studies: Dg Chest 2 View  Result Date: 02/08/2017 CLINICAL DATA:  Shortness of breath. Pain in left shoulder after fall 3 weeks ago. EXAM: CHEST  2 VIEW COMPARISON:  February 02, 2017 FINDINGS: Calcified granulomas in the right base. Mild interstitial prominence suggest pulmonary venous congestion. Probable atelectasis in the right mid lung. No other interval changes or acute abnormalities. IMPRESSION: Possible pulmonary venous congestion. Opacity in the right mid lung is likely atelectasis. No other acute interval changes. Electronically Signed   By: Dorise Bullion III M.D   On: 02/08/2017 10:26   Dg Shoulder Left  Result Date: 02/08/2017 CLINICAL DATA:  Pain after fall 3 weeks ago. EXAM: LEFT SHOULDER - 2+ VIEW COMPARISON:  None. FINDINGS: There is no evidence of fracture or dislocation. There is no evidence of arthropathy or other focal bone abnormality. Soft tissues are unremarkable. IMPRESSION: Negative. Electronically Signed   By: Dorise Bullion III M.D  On: 02/08/2017 10:27        Scheduled Meds: . atorvastatin  10 mg Oral BID  . calcium carbonate  500 mg of elemental calcium Oral BID WC  . cholecalciferol  2,000 Units Oral Daily  . donepezil  10 mg Oral QHS  . ipratropium-albuterol  3 mL Nebulization BID    . isosorbide dinitrate  5 mg Oral TID  . levothyroxine  112 mcg Oral QAC breakfast  . lidocaine  1 patch Transdermal Q24H  . multivitamin with minerals  1 tablet Oral Daily  . NIFEdipine  10 mg Oral TID AC  . nitroGLYCERIN  1 spray Sublingual TID AC  . pantoprazole  40 mg Oral Daily  . tamsulosin  0.4 mg Oral QPM  . triamcinolone  2 spray Nasal Daily  . triamcinolone ointment  1 application Topical BID   Continuous Infusions: . cefTRIAXone (ROCEPHIN)  IV Stopped (02/07/17 2108)     LOS: 5 days    Time spent: over 30 minutes    Fayrene Helper, MD [facility] [phone]  If 7PM-7AM, please contact night-coverage [email or web address] Password [email or web address] 02/08/2017, 1:02 PM

## 2017-02-08 NOTE — Telephone Encounter (Signed)
Post ED Visit - Positive Culture Follow-up  Culture report reviewed by antimicrobial stewardship pharmacist:  []  Elenor Quinones, Pharm.D. []  Heide Guile, Pharm.D., BCPS AQ-ID []  Parks Neptune, Pharm.D., BCPS []  Alycia Rossetti, Pharm.D., BCPS []  [place], [place].D., BCPS, AAHIVP []  Legrand Como, Pharm.D., BCPS, AAHIVP [x]  Salome Arnt, PharmD, BCPS []  Dimitri Ped, PharmD, BCPS []  Vincenza Hews, PharmD, BCPS  Positive urine culture Treated with Cephalexin, organism sensitive to the same and no further patient follow-up is required at this time.  Genia Del 02/08/2017, 11:13 AM

## 2017-02-09 DIAGNOSIS — I5042 Chronic combined systolic (congestive) and diastolic (congestive) heart failure: Secondary | ICD-10-CM

## 2017-02-09 DIAGNOSIS — I10 Essential (primary) hypertension: Secondary | ICD-10-CM

## 2017-02-09 DIAGNOSIS — K7460 Unspecified cirrhosis of liver: Secondary | ICD-10-CM

## 2017-02-09 DIAGNOSIS — Z8572 Personal history of non-Hodgkin lymphomas: Secondary | ICD-10-CM

## 2017-02-09 DIAGNOSIS — E039 Hypothyroidism, unspecified: Secondary | ICD-10-CM

## 2017-02-09 DIAGNOSIS — N3000 Acute cystitis without hematuria: Secondary | ICD-10-CM

## 2017-02-09 LAB — GLUCOSE, CAPILLARY
GLUCOSE-CAPILLARY: 100 mg/dL — AB (ref 65–99)
GLUCOSE-CAPILLARY: 132 mg/dL — AB (ref 65–99)
GLUCOSE-CAPILLARY: 96 mg/dL (ref 65–99)
Glucose-Capillary: 93 mg/dL (ref 65–99)

## 2017-02-09 LAB — BASIC METABOLIC PANEL
Anion gap: 9 (ref 5–15)
BUN: 37 mg/dL — ABNORMAL HIGH (ref 6–20)
CO2: 24 mmol/L (ref 22–32)
CREATININE: 1.67 mg/dL — AB (ref 0.61–1.24)
Calcium: 8.4 mg/dL — ABNORMAL LOW (ref 8.9–10.3)
Chloride: 101 mmol/L (ref 101–111)
GFR calc non Af Amer: 34 mL/min — ABNORMAL LOW (ref >60)
GFR, EST AFRICAN AMERICAN: 39 mL/min — AB (ref >60)
Glucose, Bld: 108 mg/dL — ABNORMAL HIGH (ref 65–99)
Potassium: 4.1 mmol/L (ref 3.5–5.1)
SODIUM: 134 mmol/L — AB (ref 135–145)

## 2017-02-09 LAB — CBC
HCT: 33.6 % — ABNORMAL LOW (ref 39.0–52.0)
HEMOGLOBIN: 11.5 g/dL — AB (ref 13.0–17.0)
MCH: 31.8 pg (ref 26.0–34.0)
MCHC: 34.2 g/dL (ref 30.0–36.0)
MCV: 92.8 fL (ref 78.0–100.0)
Platelets: 186 10*3/uL (ref 150–400)
RBC: 3.62 MIL/uL — ABNORMAL LOW (ref 4.22–5.81)
RDW: 14.8 % (ref 11.5–15.5)
WBC: 11.8 10*3/uL — ABNORMAL HIGH (ref 4.0–10.5)

## 2017-02-09 MED ORDER — ISOSORBIDE DINITRATE 5 MG PO TABS: 5.0000 mg | ORAL_TABLET | Freq: Three times a day (TID) | ORAL | 0 refills | Status: DC

## 2017-02-09 MED ORDER — TAMSULOSIN HCL 0.4 MG PO CAPS: 0.4000 mg | ORAL_CAPSULE | Freq: Every evening | ORAL | 0 refills | Status: AC

## 2017-02-09 NOTE — Progress Notes (Signed)
Pt 96% on room air at rest.  Got pt up to edge of bed and SpO2 remains at 96% on room air.

## 2017-02-09 NOTE — Progress Notes (Signed)
Provided pt's family with 3n1 bedside commode RX to take to [facility] to pick on [employer]. Will fax order to [facility]. Jonnie Finner RN CCM Case Mgmt phone [phone]

## 2017-02-09 NOTE — Discharge Summary (Signed)
Physician Discharge Summary  [name] [name] FFM:[id number] DOB: Aug 06, 1922 DOA: 02/02/2017  PCP: Cassandria Anger, MD  Admit date: 02/02/2017 Discharge date: 02/09/2017  Time spent: over 30 minutes  Recommendations for Outpatient Follow-up:  1. Follow up outpatient CBC/CMP 2. Follow up final urine and blood cultures 3. Follow up with GI outpatient for cirrhosis and esophageal dysmotility/possible achalasia.  He's s/p botox injection.  Was started on isordil here (also nitro and nifedipine, but these were d/c'd after speaking to GI on day of discharge) and would follow up with GI/PCP regarding long time use of this as well.  4. Follow up outpatinet cardiology 5.  Follow up repeat TSH in 4-6 weeks 6. Patient with urinary retention and discharged with foley with outpatient urology follow up    Discharge Diagnoses:  Active Problems:   Hypothyroidism   Essential hypertension   Automatic implantable cardioverter-defibrillator in situ   Anemia in chronic renal disease   History of [name]-cell lymphoma   Chronic combined systolic and diastolic CHF (congestive heart failure) (HCC)   CKD (chronic kidney disease) stage 3, GFR 30-59 ml/min (HCC)   UTI (urinary tract infection)   Liver cirrhosis (HCC)   Hyponatremia   Dysphagia   Abnormality of esophagus   Discharge Condition: stable  Diet recommendation: soft  Filed Weights   02/02/17 2251 02/04/17 1003 02/07/17 1001  Weight: 67.7 kg (149 lb 4 oz) 67.7 kg (149 lb 4 oz) 67.6 kg (149 lb)    History of present illness:  [name] [name] [name] [name] 83 y.o.malewithhistory of chronic systolic heart failure status post ICD placement, history of sinus node dysfunction status post pacemaker placement, history of [name]-cell lymphoma in remission, chronic kidney disease stage IV, chronic anemia hypothyroidism cirrhosis of liver, hypertension presents to the ER for the second time in 48 hours for fever and chills. Patient has come to the ER 2 days ago  with dysuria shortness of breath and fever chills. At that time patient was diagnosed with UTI chest x-ray was unremarkable and patient was discharged back on antibiotics.   He was admitted for URI, mild acute on chronic systolic CHF, also showed signs of dysphagia for which GI has been consulted as initial studies suggest he might have underlying achalasia as well.Botox was injected 11/16 empirically.  He was treated for [name] [name] UTI and presumptively for pneumonia as well.  He had urinary retention prior to discharge and [name] [name] foley was placed.  Hospital Course:  Staphylococcus lugdunensis UTI: - Continue ceftriaxone (11/11- 17) based on pan-sensitivity on culture, including oxacillin sensitivity.  _0  repeat ucx NGTD at time of discharge (discussed with micro lab, final result pending).   Fever, chills, cough: Presumptive diagnosis of early pneumonia without CXR infiltrate ?atypical.  - Received 7 days of ceftriaxone and 5 days of azithromycin - Continue bronchodilators, mucomyst, flutter valve _1  Recurrent fever on 11/16 with repeat blood cx so far NGTD and urine NGTD (discussed with micro lab, but final result pending) at this point as well.  CXR with possible atelectasis and pulmonary venous congestion.  Will ctm without additional antibiotics at this point.   Dysphagia:Multifactorial, due to ill-defined esophageal motility disorder, worsened by poor posture following cervical spine fracture.Severe distal esophageal spasms and oropharyngeal reflux on barium swallow. Esophageal manometry testing was nondiagnostic. - s/p botox injection 11/16, monitor intake with soft diet and continue SLP. - Continue PPI - Nifedipine and nitroglycerin were discontinued after discussion with GI on day of discharge.  Isordil was continued, but  would f/u with PCP and GI to determine whether to keep this long term.  Mild acute on chronic systolic CHF EF 27-78%. Has AICD, followed by Dr. Lovena Le. - Appears  euvolemic. Will hold diuretics and fluids for now (no diuretics on home med list) - Continue long-acting nitrate. Not on BB or ACE/ARB due to hypotension.   Idiopathic hepatic cirrhosis: Compensated - Follow up with Dr. Oneida Alar as outpatient.  Frequent falls with non-displaced C1 ring fracture:  - x-ray of L shoulder negative - Conservative management, requiring opioids for pain control.  - Recommend DC back to [facility]F with 24-hr supervision and significant home health assistance (PT, OT, aide, SLP)  BPH:  - Continue flomax.  Acute Urinary Retention:  Required I/O cath x2 from 11/17 to 11/18 (with over 400 out each time).  Foley placed prior to discharge with plans for outpatient urology follow up.    Hypothyroidism.  - Synthroid 112 mcg continued - TSH borderline elevated at 4.962, repeat TSH in 4-6 weeks.  Mild dementia:  - Continue aricept - Delirium precautions reviewed with pt and family.   History of gastric lymphoma: In remission, followed by Dr. Alvy Bimler. No masses on EGD 11/13.   Dyslipidemia:  - Continue statin  Stage IV CKD: Baseline SCr ~1.6.  - Monitor intermittently as this has been stable at baseline.  Anemia of chronic disease: - Stable no acute issues.   Procedures: 11/16 EGD Tortuous esophagus. - Dilation in the upper third of the esophagus and in the middle third of the esophagus. - Abnormal esophageal motility. Injected with botulinum toxin.  - Normal stomach. - Normal examined duodenum. - No specimens collected. This patient's dysphagia appears to [name] [name] complex scenario of diffuse, ill-defined motility disorder and tortuous cervical esophagus. The latter is exacerbated by poor posture/eating position after recent cervical spine fracture, probable accuonting for recent worsening of symptoms. Therefore, it remains to be seen how much improvment there will be after this Botox treatment. MAC sedation used Moderate Sedation: - Return patient to  hospital ward. - Resume previous diet. - Continue present medications.  EGD 11/13 1. Dilated somewhat tortuous esophagus. Otherwise normal. No significant stricture. No tumor 2. Otherwise normal exam. No evidence for gastric tumor. Impression: none Moderate Sedation: 1. Soft diet 2. Schedule esophageal manometry to rule out achalasia 3. Continue present medications. 3. Continue present medications. 4. Ongoing management of multiple medical problems per primary medical service  11/14  Nondiagnostic esophageal mannometry  Consultations:  Gastroenterology  Discharge Exam: Vitals:   02/09/17 0757 02/09/17 1707  BP:  122/66  Pulse:    Resp:    Temp:    SpO2: 95%    Feeling better.  Some thoracic pain at L ribs and back pain (since fall).  No SOB, no abdominal pain.  Swallowing seems improved.  General: No acute distress. Cardiovascular: Heart sounds show Dolorez Jeffrey regular rate, and rhythm. No gallops or rubs. No murmurs. No JVD. Lungs: Clear to auscultation bilaterally with good air movement. No rales, rhonchi or wheezes. Abdomen: Soft, nontender, nondistended with normal active bowel sounds. No masses. No hepatosplenomegaly. Neurological: Alert.  Moves all extremities 4 with equal strength. Cranial nerves II through XII grossly intact. Skin: Warm and dry. No rashes or lesions. Extremities: No clubbing or cyanosis. No edema.  Psychiatric: Mood and affect are normal. Insight and judgment are appropriate.   Discharge Instructions   Discharge Instructions    Call MD for:  difficulty breathing, headache or visual disturbances   Complete by:  As directed    Call MD for:  extreme fatigue   Complete by:  As directed    Call MD for:  persistant dizziness or light-headedness   Complete by:  As directed    Call MD for:  persistant nausea and vomiting   Complete by:  As directed    Call MD for:  redness, tenderness, or signs of infection (pain, swelling, redness, odor or  green/yellow discharge around incision site)   Complete by:  As directed    Call MD for:  severe uncontrolled pain   Complete by:  As directed    Call MD for:  temperature >100.4   Complete by:  As directed    Discharge instructions   Complete by:  As directed    You were seen in the hospital for Chantee Cerino urinary tract infection and possible pneumonia.  You received 7 days of antibiotics and have improved.  You were also seen by gastroenterology for your trouble swallowing.  They gave you Kiaan Overholser botox injection and your symptoms seem to have improved.  Please follow up with your primary care doctor within the next few days for follow up and repeat labs.  Please follow up with your outpatient GI doctor, Dr. Oneida Alar, for your swallowing and your liver.  You should also follow up with cardiology, Dr. Lovena Le, as an outpatient as well.  You were started on Yash Cacciola new medicine here called isordil (this was to help with the swallowing, but could also be beneficial for your heart failure).  Please follow up with your outpatient providers regarding the use of this long term.  Return with new, worsening, or recurrent symptoms.  You had Johnsie Moscoso fever Renald Haithcock few days ago, but so far cultures are negative.  I don't think we need additional antibiotics at this point, but please return to care if you have new or recurrent fevers or infectious symptoms.  You had Braycen Burandt foley placed for urinary retention.  We have set up follow up with you with urology where they will help get the foley out.  You should eat Galileo Colello soft diet for your esophageal dysmotility.   Increase activity slowly   Complete by:  As directed    Increase activity slowly   Complete by:  As directed      Current Discharge Medication List    START taking these medications   Details  isosorbide dinitrate (ISORDIL) 5 MG tablet Take 1 tablet (5 mg total) 3 (three) times daily by mouth. Qty: 90 tablet, Refills: 0      CONTINUE these medications which have CHANGED   Details  tamsulosin  (FLOMAX) 0.4 MG CAPS capsule Take 1 capsule (0.4 mg total) every evening by mouth. Qty: 30 capsule, Refills: 0      CONTINUE these medications which have NOT CHANGED   Details  albuterol (PROVENTIL) (2.5 MG/3ML) 0.083% nebulizer solution USE ONE VIAL VIA NEBULIZER FOUR TIMES DAILY AS NEEDED. Qty: 375 mL, Refills: 11    atorvastatin (LIPITOR) 20 MG tablet TAKE 1/2 TABLET BY MOUTH TWICE DAILY Qty: 90 tablet, Refills: 1    calcium carbonate (OSCAL) 1500 (600 Ca) MG TABS tablet Take 600 mg of elemental calcium 2 (two) times daily with Ledford Goodson meal by mouth.    Cholecalciferol (VITAMIN D) 2000 units CAPS Take 2,000 Units daily by mouth.    diltiazem 2 % GEL Apply 1 application topically 3 (three) times daily. Qty: 30 g, Refills: 11   Associated Diagnoses: Stenosis, anal canal    diphenoxylate-atropine (  LOMOTIL) 2.5-0.025 MG tablet Take 1 tablet by mouth 4 (four) times daily as needed for diarrhea or loose stools. Qty: 60 tablet, Refills: 1    docusate sodium (COLACE) 100 MG capsule Take 100 mg daily as needed by mouth for mild constipation.    HYDROcodone-acetaminophen (NORCO/VICODIN) 5-325 MG tablet Take 1 tablet every 4 (four) hours as needed by mouth for moderate pain. Qty: 15 tablet, Refills: 0    levothyroxine (SYNTHROID, LEVOTHROID) 112 MCG tablet TAKE 1 TABLET BY MOUTH DAILY Qty: 90 tablet, Refills: 0    multivitamin-iron-minerals-folic acid (CENTRUM) chewable tablet Chew 1 tablet by mouth daily.    pantoprazole (PROTONIX) 40 MG tablet Take 1 tablet (40 mg total) by mouth daily. Qty: 90 tablet, Refills: 1    triamcinolone (NASACORT AQ) 55 MCG/ACT AERO nasal inhaler Place 2 sprays into the nose daily. Qty: 3 Inhaler, Refills: 3    triamcinolone ointment (KENALOG) 0.1 % Apply 1 application topically 2 (two) times daily. Qty: 80 g, Refills: 2    acetaminophen (TYLENOL) 500 MG tablet Take 500 mg by mouth every 6 (six) hours as needed for headache.    AMBULATORY NON FORMULARY  MEDICATION Diltiazem gel 2% Apply small amount pea-sized to anal area 2-3 times Fotios Amos day for anal stenosis. Use as needed. Qty: 30 g, Refills: 5   Associated Diagnoses: Stenosis, anal canal    Artificial Tear Ointment (DRY EYES OP) Apply 1 drop to eye daily.    donepezil (ARICEPT) 10 MG tablet TAKE 1 TABLET BY MOUTH EVERY NIGHT AT BEDTIME Qty: 90 tablet, Refills: 0    fluticasone (FLONASE) 50 MCG/ACT nasal spray Place 2 sprays into both nostrils daily. Qty: 48 g, Refills: 3    nitroGLYCERIN (NITROSTAT) 0.4 MG SL tablet Place 1 tablet (0.4 mg total) under the tongue every 5 (five) minutes as needed. Chest pain Qty: 20 tablet, Refills: 1      STOP taking these medications     cephALEXin (KEFLEX) 500 MG capsule        Allergies  Allergen Reactions  . Aleve [Naproxen Sodium] Other (See Comments)    Bleeding ulcers  . Ibuprofen Other (See Comments)    Bleeding ulcers  . Tramadol     Hallucinations    Follow-up Information    [facility] Follow up.   Specialty:  Home Health Services Why:  Home Health Physical Therapy, RN, Social Worker, aide and Speech Therapy-agency will call to arrange initial visit Contact information: [place] [address] [phone]        Plotnikov, Evie Lacks, MD Follow up.   Specialty:  Internal Medicine Contact information: [place] [address] [phone]        Danie Binder, MD Follow up.   Specialty:  Gastroenterology Contact information: [address] [place] [address] [phone]        Call Ardis Hughs, MD.   Specialty:  Urology Why:  For an appointment in ~1 week when you get home. Contact information: [place] [address] [place] Follow up.   Why:  you can stop at retail store to pick up 3n1 bedside commode Contact information: [address] [place] [address] [phone]             The results of significant diagnostics from this hospitalization (including imaging, microbiology, ancillary and laboratory) are listed below for reference.  Significant Diagnostic Studies: Dg Chest 2 View  Result Date: 02/08/2017 CLINICAL DATA:  Shortness of breath. Pain in left shoulder after fall 3 weeks ago. EXAM: CHEST  2 VIEW COMPARISON:  February 02, 2017 FINDINGS: Calcified granulomas in the right base. Mild interstitial prominence suggest pulmonary venous congestion. Probable atelectasis in the right mid lung. No other interval changes or acute abnormalities. IMPRESSION: Possible pulmonary venous congestion. Opacity in the right mid lung is likely atelectasis. No other acute interval changes. Electronically Signed   By: Dorise Bullion III M.D   On: 02/08/2017 10:26   Dg Chest 2 View  Result Date: 02/01/2017 CLINICAL DATA:  Increasingly short of breath since fall 3 weeks ago. EXAM: CHEST  2 VIEW COMPARISON:  05/15/2015 FINDINGS: The heart remains normal in size. Aorta rib is tortuous. Mediastinum is rotated to the right limiting evaluation of the mediastinum. Lungs are under aerated. Chronic interstitial changes. Calcified granulomata at the right lung base. Stable thoracic spine. Osteopenia. No pneumothorax or pleural effusion. AICD device is unchanged via left subclavian vein. Chronic sternal deformity. IMPRESSION: No active cardiopulmonary disease. Electronically Signed   By: Marybelle Killings M.D.   On: 02/01/2017 11:54   Dg Ribs Unilateral Left  Result Date: 02/01/2017 CLINICAL DATA:  Increasing SOB since fall 3 weeks ago; left lower rib pain x 3 weeks; hx gastric CA, CHF, HTN; ex smoker quit 40 years ago; EXAM: LEFT RIBS - 2 VIEW COMPARISON:  Current chest radiograph. Previous chest radiograph, 05/15/2015. FINDINGS: No rib fracture or rib lesion.  Bony thorax is demineralized. No left lung consolidation. No pleural effusion or evidence of Lurena Naeve pneumothorax. IMPRESSION: No rib  fracture or rib lesion. Electronically Signed   By: Lajean Manes M.D.   On: 02/01/2017 11:50   Dg Esophagus  Result Date: 02/03/2017 CLINICAL DATA:  Dysphagia. EXAM: ESOPHOGRAM/BARIUM SWALLOW TECHNIQUE: Single contrast examination was performed using  thin barium. FLUOROSCOPY TIME:  Fluoroscopy Time:  2.8 minutes Radiation Exposure Index (if provided by the fluoroscopic device): 46.9 mGy COMPARISON:  None. FINDINGS: The esophagus is slightly tortuous with extensive intermittent spasms with severe spasm just above the gastroesophageal junction. There is extensive to and fro peristalsis with contrast extending back into the oropharynx repeatedly, even in the semi-erect position. There is occasional relaxation of severe spasm in the distal esophagus. The lumen during relaxation is approximately 6-7 mm in diameter. No visible masses. Because of the severe distal spasm, I did not give the patient Keila Turan barium tablet. IMPRESSION: 1. Extensive spasm throughout the esophagus with severe spasm in the distal esophagus just above the gastroesophageal junction. This area does intermittently relax to Fareed Fung diameter of approximately 6-7 mm. 2. To and fro peristalsis in the upper esophagus with reflux into the oropharynx repeatedly. Electronically Signed   By: Lorriane Shire M.D.   On: 02/03/2017 15:21   Dg Chest Port 1 View  Result Date: 02/02/2017 CLINICAL DATA:  83 year old male with cough and fever. EXAM: PORTABLE CHEST 1 VIEW COMPARISON:  Chest radiograph dated 02/01/2017 FINDINGS: There is shallow inspiration with bibasilar linear atelectasis/ scarring. Stable calcified granuloma at the right lung base. No focal consolidation, pleural effusion, or pneumothorax. The cardiac silhouette is within normal limits. There is atherosclerotic calcification of the aorta. Left pectoral dual lead AICD device. There is osteopenia with degenerative changes of the spine. No acute osseous pathology. IMPRESSION: No active disease.  Electronically Signed   By: Anner Crete M.D.   On: 02/02/2017 19:10   Dg Shoulder Left  Result Date: 02/08/2017 CLINICAL DATA:  Pain after fall 3 weeks ago. EXAM: LEFT SHOULDER - 2+ VIEW COMPARISON:  None. FINDINGS: There is no evidence of fracture or dislocation. There is no evidence of arthropathy or other focal bone abnormality. Soft tissues are unremarkable. IMPRESSION: Negative. Electronically Signed   By: Dorise Bullion III M.D   On: 02/08/2017 10:27   Dg Swallowing Func-speech Pathology  Result Date: 02/03/2017 Objective Swallowing Evaluation: Type of Study: MBS-Modified Barium Swallow Study  Patient Details Name: JOSHVA LABRECK MRN: [id number] Date of Birth: [birth year] Today's Date: 02/03/2017 Time: SLP Start Time (ACUTE ONLY): 4888 -SLP Stop Time (ACUTE ONLY): 1420 SLP Time Calculation (min) (ACUTE ONLY): 25 min Past Medical History: Past Medical History: Diagnosis Date . Cancer ([place])   Stomach . CHF (congestive heart failure) ([place])  . Collagen vascular disease ([place])  . Hypertension  Past Surgical History: Past Surgical History: Procedure Laterality Date . APPENDECTOMY   . COLONOSCOPY  12/1998, 02/2004  diverticulosois, external hemorrhoids . CRANIOTOMY Left  . ESOPHAGOGASTRODUODENOSCOPY  06/17/2014  Dr. Oneida Alar: 1. Patent stricture at the gastroesophageal junction 2. UGIB Due to multiple  gastric ulcers 3. Moderate Duodentitis. Negative H.pylori . heart disease    permanent pacemaker . HIP FRACTURE SURGERY  2011  [facility]  R. . INGUINAL HERNIA REPAIR   . IR GENERIC HISTORICAL  10/26/2015  IR RADIOLOGIST EVAL & MGMT 10/26/2015 [facility]-INTERV RAD . PACEMAKER INSERTION   HPI: pt is Grisel Blumenstock 83 yo male adm [facility] with UTI/fever.  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  He does report some difficulties after his stomach surgery in March 2016 to treat his cancer.  CXR negative.  Pt admits to progressive  problems with swallowing noted recently but denies sudden onset.  He denies ever having swallow evaluation completed previously.   Subjective: pt awake in chair Assessment / Plan / Recommendation CHL IP CLINICAL IMPRESSIONS 02/03/2017 Clinical Impression Pt presents with functional oral swallow with pharyngocervical esophageal *suspect primary esophageal* deficits.  He was only tested with Djuna Frechette few liquid boluses due to concerns for primary esophageal deficits and need for esophagram.  Premature spillage of boluses into pharynx observed - with swallow triggering at pyriform sinus with thin liquid via tsp.  Trace vallecular/pyriform sinus residual noted with nectar which pt independently dry swallows to clear.  Trace laryngeal penetration of thin before swallow due to decreased timing of laryngeal closure.  Pt did not aspirate but he does demonstrate backflow of liquid barium into pharynx/open larynx.  Upon esophageal sweep, his esophagus appeared full - ? spasms. Trace laryngeal penetration of thin/nectar backflowed material observed with pt conducting further swallows to help clear.  Suspect he is having some aspiration primarily due to his esophageal deficits.  Esophagram to follow, will follow up.  Educated pt to findings/concerns using teach back.   SLP Visit Diagnosis Dysphagia, pharyngoesophageal phase (R13.14);Dysphagia, unspecified (R13.10) Attention and concentration deficit following -- Frontal lobe and executive function deficit following -- Impact on safety and function Risk for inadequate nutrition/hydration;Moderate aspiration risk   CHL IP TREATMENT RECOMMENDATION 02/03/2017 Treatment Recommendations Therapy as outlined in treatment plan below   Prognosis 02/03/2017 Prognosis for Safe Diet Advancement Guarded Barriers to Reach Goals -- Barriers/Prognosis Comment -- CHL IP DIET RECOMMENDATION 02/03/2017 SLP Diet Recommendations NPO;Ice chips PRN after oral care Liquid Administration via -- Medication  Administration -- Compensations -- Postural Changes --   CHL IP  OTHER RECOMMENDATIONS 02/03/2017 Recommended Consults Consider esophageal assessment Oral Care Recommendations -- Other Recommendations --   CHL IP FOLLOW UP RECOMMENDATIONS 03/16/2015 Follow up Recommendations None   CHL IP FREQUENCY AND DURATION 02/03/2017 Speech Therapy Frequency (ACUTE ONLY) min 1 x/week Treatment Duration 1 week      CHL IP ORAL PHASE 02/03/2017 Oral Phase Impaired Oral - Pudding Teaspoon -- Oral - Pudding Cup -- Oral - Honey Teaspoon -- Oral - Honey Cup -- Oral - Nectar Teaspoon -- Oral - Nectar Cup -- Oral - Nectar Straw WFL Oral - Thin Teaspoon WFL Oral - Thin Cup -- Oral - Thin Straw WFL Oral - Puree -- Oral - Mech Soft -- Oral - Regular -- Oral - Multi-Consistency -- Oral - Pill -- Oral Phase - Comment --  CHL IP PHARYNGEAL PHASE 02/03/2017 Pharyngeal Phase WFL Pharyngeal- Pudding Teaspoon -- Pharyngeal -- Pharyngeal- Pudding Cup -- Pharyngeal -- Pharyngeal- Honey Teaspoon -- Pharyngeal -- Pharyngeal- Honey Cup -- Pharyngeal -- Pharyngeal- Nectar Teaspoon -- Pharyngeal -- Pharyngeal- Nectar Cup -- Pharyngeal -- Pharyngeal- Nectar Straw WFL;Delayed swallow initiation-pyriform sinuses;Pharyngeal residue - valleculae;Pharyngeal residue - pyriform Pharyngeal -- Pharyngeal- Thin Teaspoon WFL Pharyngeal -- Pharyngeal- Thin Cup -- Pharyngeal -- Pharyngeal- Thin Straw WFL;Penetration/Apiration after swallow;Penetration/Aspiration before swallow;Reduced airway/laryngeal closure;Reduced laryngeal elevation;Pharyngeal residue - valleculae;Pharyngeal residue - pyriform Pharyngeal Material enters airway, remains ABOVE vocal cords then ejected out;Material enters airway, remains ABOVE vocal cords and not ejected out Pharyngeal- Puree -- Pharyngeal -- Pharyngeal- Mechanical Soft -- Pharyngeal -- Pharyngeal- Regular -- Pharyngeal -- Pharyngeal- Multi-consistency -- Pharyngeal -- Pharyngeal- Pill -- Pharyngeal -- Pharyngeal Comment --  CHL IP  CERVICAL ESOPHAGEAL PHASE 02/03/2017 Cervical Esophageal Phase Impaired Pudding Teaspoon -- Pudding Cup -- Honey Teaspoon -- Honey Cup -- Nectar Teaspoon -- Nectar Cup -- Nectar Straw Esophageal backflow into the pharynx;Esophageal backflow into cervical esophagus;Reduced cricopharyngeal relaxation Thin Teaspoon Esophageal backflow into the pharynx;Esophageal backflow into cervical esophagus Thin Cup -- Thin Straw Esophageal backflow into the pharynx;Esophageal backflow into cervical esophagus Puree -- Mechanical Soft -- Regular -- Multi-consistency -- Pill -- Cervical Esophageal Comment ? reduced relaxation of cricopharyngeus and esophageal backflow into pharynx, pt conducts multiple swallows CHL IP GO 02/03/2017 Functional Assessment Tool Used clinical judgement Functional Limitations Swallowing Swallow Current Status (E3154) CM Swallow Goal Status (M0867) CL Swallow Discharge Status (Y1950) (None) Motor Speech Current Status (D3267) (None) Motor Speech Goal Status (T2458) (None) Motor Speech Goal Status (K9983) (None) Spoken Language Comprehension Current Status (J8250) (None) Spoken Language Comprehension Goal Status (N3976) (None) Spoken Language Comprehension Discharge Status [phone]) (None) Spoken Language Expression Current Status [phone]) (None) Spoken Language Expression Goal Status [phone]) (None) Spoken Language Expression Discharge Status [phone]) (None) Attention Current Status (M4268) (None) Attention Goal Status (T4196) (None) Attention Discharge Status [phone]) (None) Memory Current Status (L8921) (None) Memory Goal Status (J9417) (None) Memory Discharge Status (E0814) (None) Voice Current Status (G8185) (None) Voice Goal Status (U3149) (None) Voice Discharge Status (F0263) (None) Other Speech-Language Pathology Functional Limitation Current Status (Z8588) (None) Other Speech-Language Pathology Functional Limitation Goal Status (F0277) (None) Other Speech-Language Pathology Functional Limitation  Discharge Status [phone]) (None) Luanna Salk, MS [facility] SLP [phone] Macario Golds 02/03/2017, 4:24 PM               Microbiology: Recent Results (from the past 240 hour(s))  Urine culture     Status: Abnormal   Collection Time: 02/01/17  2:39 PM  Result Value Ref Range Status   Specimen Description URINE, RANDOM  Final  Special Requests NONE  Final   Culture (Mannix Kroeker)  Final    50,000 COLONIES/mL STAPHYLOCOCCUS SPECIES (COAGULASE NEGATIVE)   Report Status 02/07/2017 FINAL  Final   Organism ID, Bacteria STAPHYLOCOCCUS SPECIES (COAGULASE NEGATIVE) (Michell Kader)  Final      Susceptibility   Staphylococcus species (coagulase negative) - MIC*    CIPROFLOXACIN <=0.5 SENSITIVE Sensitive     GENTAMICIN <=0.5 SENSITIVE Sensitive     NITROFURANTOIN <=16 SENSITIVE Sensitive     OXACILLIN 0.5 SENSITIVE Sensitive     TETRACYCLINE <=1 SENSITIVE Sensitive     VANCOMYCIN <=0.5 SENSITIVE Sensitive     TRIMETH/SULFA <=10 SENSITIVE Sensitive     CLINDAMYCIN <=0.25 SENSITIVE Sensitive     RIFAMPIN <=0.5 SENSITIVE Sensitive     Inducible Clindamycin NEGATIVE Sensitive     * 50,000 COLONIES/mL STAPHYLOCOCCUS SPECIES (COAGULASE NEGATIVE)  Urine culture     Status: Abnormal   Collection Time: 02/02/17  6:28 PM  Result Value Ref Range Status   Specimen Description URINE, RANDOM  Final   Special Requests NONE  Final   Culture 60,000 COLONIES/mL STAPHYLOCOCCUS LUGDUNENSIS (Kalik Hoare)  Final   Report Status 02/05/2017 FINAL  Final   Organism ID, Bacteria STAPHYLOCOCCUS LUGDUNENSIS (Ninoska Goswick)  Final      Susceptibility   Staphylococcus lugdunensis - MIC*    CIPROFLOXACIN <=0.5 SENSITIVE Sensitive     GENTAMICIN <=0.5 SENSITIVE Sensitive     NITROFURANTOIN <=16 SENSITIVE Sensitive     OXACILLIN 1 SENSITIVE Sensitive     TETRACYCLINE <=1 SENSITIVE Sensitive     VANCOMYCIN <=0.5 SENSITIVE Sensitive     TRIMETH/SULFA <=10 SENSITIVE Sensitive     CLINDAMYCIN <=0.25 SENSITIVE Sensitive     RIFAMPIN <=0.5 SENSITIVE Sensitive      Inducible Clindamycin NEGATIVE Sensitive     * 60,000 COLONIES/mL STAPHYLOCOCCUS LUGDUNENSIS  Blood culture (routine x 2)     Status: None   Collection Time: 02/02/17  7:27 PM  Result Value Ref Range Status   Specimen Description BLOOD LEFT FOREARM  Final   Special Requests   Final    BOTTLES DRAWN AEROBIC AND ANAEROBIC Blood Culture adequate volume   Culture   Final    NO GROWTH 5 DAYS Performed at [facility], [address] [place], [place] [address]    Report Status 02/08/2017 FINAL  Final  Blood culture (routine x 2)     Status: None   Collection Time: 02/02/17  8:40 PM  Result Value Ref Range Status   Specimen Description BLOOD RIGHT HAND  Final   Special Requests   Final    BOTTLES DRAWN AEROBIC AND ANAEROBIC Blood Culture adequate volume   Culture   Final    NO GROWTH 5 DAYS Performed at [facility], [place] [address], [place], [place] [address]    Report Status 02/08/2017 FINAL  Final  MRSA PCR Screening     Status: None   Collection Time: 02/03/17  5:14 AM  Result Value Ref Range Status   MRSA by PCR NEGATIVE NEGATIVE Final    Comment:        The GeneXpert MRSA Assay (FDA approved for NASAL specimens only), is one component of Zae Kirtz comprehensive MRSA colonization surveillance program. It is not intended to diagnose MRSA infection nor to guide or monitor treatment for MRSA infections.   Culture, expectorated sputum-assessment     Status: None   Collection Time: 02/03/17  9:30 AM  Result Value Ref Range Status   Specimen Description EXPECTORATED SPUTUM  Final   Special Requests NONE  Final   Sputum evaluation   Final    Sputum specimen not acceptable for testing.  Please recollect.   INFORMED DUNKLEBERGER,C. RN _0  ON 11.12.18 BY NMCCOY    Report Status 02/03/2017 FINAL  Final  Culture, blood (routine x 2)     Status: None (Preliminary result)   Collection Time: 02/08/17 10:22 AM  Result Value Ref Range Status   Specimen Description BLOOD LEFT  ANTECUBITAL  Final   Special Requests   Final    BOTTLES DRAWN AEROBIC AND ANAEROBIC Blood Culture adequate volume   Culture   Final    NO GROWTH 1 DAY Performed at [facility], [place] [address] [place], [place] [address]    Report Status PENDING  Incomplete  Culture, blood (routine x 2)     Status: None (Preliminary result)   Collection Time: 02/08/17 10:26 AM  Result Value Ref Range Status   Specimen Description BLOOD BLOOD RIGHT FOREARM  Final   Special Requests   Final    BOTTLES DRAWN AEROBIC AND ANAEROBIC Blood Culture adequate volume   Culture   Final    NO GROWTH 1 DAY Performed at [facility], [place] [address] [place], [place] [address]    Report Status PENDING  Incomplete     Labs: Basic Metabolic Panel: Recent Labs  Lab 02/03/17 0237 02/04/17 0245 02/05/17 0452 02/08/17 1022 02/09/17 0503  NA 130* 132* 137 134* 134*  K 4.4 4.1 4.2 4.0 4.1  CL 104 104 106 100* 101  CO2 20* 20* _1 GLUCOSE 95 93 82 127* 108*  BUN 31* 32* 29* 34* 37*  CREATININE 1.48* 1.64* 1.56* 1.79* 1.67*  CALCIUM 8.1* 8.2* 8.9 8.8* 8.4*  MG  --  1.6*  --   --   --    Liver Function Tests: Recent Labs  Lab 02/02/17 1826  AST 29  ALT 19  ALKPHOS 383*  BILITOT 0.8  PROT 7.3  ALBUMIN 3.6   No results for input(s): LIPASE, AMYLASE in the last 168 hours. No results for input(s): AMMONIA in the last 168 hours. CBC: Recent Labs  Lab 02/02/17 1826 02/03/17 0237 02/04/17 0245 02/05/17 0452 02/08/17 1022 02/09/17 0503  WBC 12.9* 10.3 9.4 10.6* 14.8* 11.8*  NEUTROABS 9.0*  --   --   --   --   --   HGB 12.5* 10.7* 9.9* 11.3* 13.2 11.5*  HCT 36.5* 31.7* 29.8* 33.7* 38.8* 33.6*  MCV 92.9 93.5 93.7 93.4 93.7 92.8  PLT 197 170 174 195 207 186   Cardiac Enzymes: Recent Labs  Lab 02/03/17 0237 02/03/17 0815  CKTOTAL  --  47  TROPONINI <0.03 <0.03   BNP: BNP (last 3 results) No results for input(s): BNP in the last 8760 hours.  ProBNP (last 3 results) No  results for input(s): PROBNP in the last 8760 hours.  CBG: Recent Labs  Lab 02/08/17 1221 02/08/17 1725 02/09/17 0004 02/09/17 0518 02/09/17 1208  GLUCAP 94 119* 100* 93 132*       Signed:  Fayrene Helper MD.  [facility] 02/09/2017, 6:23 PM

## 2017-02-09 NOTE — Care Management Note (Signed)
Case Management Note  Patient Details  Name: PHYLLIS WHITEFIELD MRN: [id number] Date of Birth: 1922-07-23  Subjective/Objective:    UTI, dysphagia                Action/Plan: Discharge Planning: NCM spoke to pt and dtr at bedside. Pt lives at [address]. [employer] utilizes [facility] for [employer]. And [facility] provides aide services. Dtr contacted [facility] and has arranged 24 hour care for pt post dc. Pt has RW and hospital bed at home. Oxygen sat ambulating were evaluated and not needed.  Contacted [facility] with new referral. Pt has declined SNF.   PCP Cassandria Anger MD  Expected Discharge Date:      02/09/2017            Expected Discharge Plan:  [place]  In-House Referral:  Clinical Social Work  Discharge planning Services  CM Consult  Post Acute Care Choice:  Home Health Choice offered to:  Adult Children  DME Arranged:  N/A DME Agency:     HH Arranged:  RN, PT, OT, Nurse's Aide, Speech Therapy, Social Work [employer] Agency:  [facility]  Status of Service:  Completed, signed off  If discussed at [employer], dates discussed:    Additional Comments:  Erenest Rasher, RN 02/09/2017, 2:12 PM

## 2017-02-09 NOTE — Plan of Care (Signed)
  Completed/Met Education: Knowledge of General Education information will improve 02/09/2017 1001 - Completed/Met by Kamaiyah Uselton, Royetta Crochet, RN

## 2017-02-10 ENCOUNTER — Encounter (HOSPITAL_COMMUNITY): Payer: Self-pay | Admitting: Gastroenterology

## 2017-02-10 ENCOUNTER — Telehealth: Payer: Self-pay | Admitting: Internal Medicine

## 2017-02-10 ENCOUNTER — Telehealth: Payer: Self-pay | Admitting: *Deleted

## 2017-02-10 LAB — URINE CULTURE: Culture: NO GROWTH

## 2017-02-10 NOTE — Telephone Encounter (Signed)
OK. Thx

## 2017-02-10 NOTE — Telephone Encounter (Signed)
Transition Care Management Follow-up Telephone Call   Date discharged? 02/09/17   How have you been since you were released from the hospital? Spoke w/daughter she states dad is doing ok   Do you understand why you were in the hospital? YES   Do you understand the discharge instructions? YES   Where were you discharged to? Home   Items Reviewed:  Medications reviewed: YES  Allergies reviewed: YES  Dietary changes reviewed: YES  Referrals reviewed: No referral needed   Functional Questionnaire:   Activities of Daily Living (ADLs):   She states he are independent in the following: bathing and hygiene, feeding, continence, grooming and toileting States they require assistance with the following: ambulation   Any transportation issues/concerns?: NO   Any patient concerns?  NO   Confirmed importance and date/time of follow-up visits scheduled YES, appt 02/17/17  Provider Appointment booked with Dr. Alain Marion  Confirmed with patient if condition begins to worsen call PCP or go to the ER.  Patient was given the office number and encouraged to call back with question or concerns.  : YES

## 2017-02-10 NOTE — Telephone Encounter (Signed)
Called Kathy no answer LMOM w/MD response../lmb 

## 2017-02-10 NOTE — Anesthesia Postprocedure Evaluation (Signed)
Anesthesia Post Note  Patient: [name] [name]  Procedure(s) Performed: ESOPHAGOGASTRODUODENOSCOPY (EGD) WITH PROPOFOL (N/A )     Patient location during evaluation: PACU Anesthesia Type: MAC Level of consciousness: awake and alert Pain management: pain level controlled Vital Signs Assessment: post-procedure vital signs reviewed and stable Respiratory status: spontaneous breathing, nonlabored ventilation, respiratory function stable and patient connected to nasal cannula oxygen Cardiovascular status: stable and blood pressure returned to baseline Postop Assessment: no apparent nausea or vomiting Anesthetic complications: no    Last Vitals:  Vitals:   02/09/17 0757 02/09/17 1707  BP:  122/66  Pulse:    Resp:    Temp:    SpO2: 95%     Last Pain:  Vitals:   02/09/17 0846  TempSrc:   PainSc: 4                  [name]

## 2017-02-10 NOTE — Telephone Encounter (Signed)
Needs verbals to start servoces for PT, OT and Speech  Please advise

## 2017-02-11 DIAGNOSIS — I5023 Acute on chronic systolic (congestive) heart failure: Secondary | ICD-10-CM | POA: Diagnosis not present

## 2017-02-11 DIAGNOSIS — R338 Other retention of urine: Secondary | ICD-10-CM | POA: Diagnosis not present

## 2017-02-11 DIAGNOSIS — N184 Chronic kidney disease, stage 4 (severe): Secondary | ICD-10-CM | POA: Diagnosis not present

## 2017-02-11 DIAGNOSIS — I5032 Chronic diastolic (congestive) heart failure: Secondary | ICD-10-CM | POA: Diagnosis not present

## 2017-02-11 DIAGNOSIS — N401 Enlarged prostate with lower urinary tract symptoms: Secondary | ICD-10-CM | POA: Diagnosis not present

## 2017-02-11 DIAGNOSIS — I130 Hypertensive heart and chronic kidney disease with heart failure and stage 1 through stage 4 chronic kidney disease, or unspecified chronic kidney disease: Secondary | ICD-10-CM | POA: Diagnosis not present

## 2017-02-13 LAB — CULTURE, BLOOD (ROUTINE X 2)
CULTURE: NO GROWTH
Culture: NO GROWTH
Special Requests: ADEQUATE
Special Requests: ADEQUATE

## 2017-02-15 DIAGNOSIS — N184 Chronic kidney disease, stage 4 (severe): Secondary | ICD-10-CM | POA: Diagnosis not present

## 2017-02-15 DIAGNOSIS — N401 Enlarged prostate with lower urinary tract symptoms: Secondary | ICD-10-CM | POA: Diagnosis not present

## 2017-02-15 DIAGNOSIS — I130 Hypertensive heart and chronic kidney disease with heart failure and stage 1 through stage 4 chronic kidney disease, or unspecified chronic kidney disease: Secondary | ICD-10-CM | POA: Diagnosis not present

## 2017-02-15 DIAGNOSIS — R338 Other retention of urine: Secondary | ICD-10-CM | POA: Diagnosis not present

## 2017-02-15 DIAGNOSIS — I5023 Acute on chronic systolic (congestive) heart failure: Secondary | ICD-10-CM | POA: Diagnosis not present

## 2017-02-15 DIAGNOSIS — I5032 Chronic diastolic (congestive) heart failure: Secondary | ICD-10-CM | POA: Diagnosis not present

## 2017-02-16 DIAGNOSIS — Z23 Encounter for immunization: Secondary | ICD-10-CM | POA: Diagnosis not present

## 2017-02-17 ENCOUNTER — Inpatient Hospital Stay (HOSPITAL_COMMUNITY)
Admission: EM | Admit: 2017-02-17 | Discharge: 2017-02-19 | DRG: 177 | Disposition: A | Discharge status: Hospice | Payer: Medicare Other | Attending: Family Medicine | Admitting: Family Medicine

## 2017-02-17 ENCOUNTER — Encounter: Payer: Self-pay | Admitting: Internal Medicine

## 2017-02-17 ENCOUNTER — Emergency Department (HOSPITAL_COMMUNITY): Payer: Medicare Other

## 2017-02-17 ENCOUNTER — Encounter (HOSPITAL_COMMUNITY): Payer: Self-pay | Admitting: Family Medicine

## 2017-02-17 ENCOUNTER — Ambulatory Visit (INDEPENDENT_AMBULATORY_CARE_PROVIDER_SITE_OTHER): Payer: Medicare Other | Admitting: Internal Medicine

## 2017-02-17 DIAGNOSIS — D72829 Elevated white blood cell count, unspecified: Secondary | ICD-10-CM | POA: Diagnosis present

## 2017-02-17 DIAGNOSIS — F0390 Unspecified dementia without behavioral disturbance: Secondary | ICD-10-CM | POA: Diagnosis present

## 2017-02-17 DIAGNOSIS — J9691 Respiratory failure, unspecified with hypoxia: Secondary | ICD-10-CM

## 2017-02-17 DIAGNOSIS — L8990 Pressure ulcer of unspecified site, unspecified stage: Secondary | ICD-10-CM

## 2017-02-17 DIAGNOSIS — Z886 Allergy status to analgesic agent status: Secondary | ICD-10-CM

## 2017-02-17 DIAGNOSIS — E785 Hyperlipidemia, unspecified: Secondary | ICD-10-CM | POA: Diagnosis present

## 2017-02-17 DIAGNOSIS — Z809 Family history of malignant neoplasm, unspecified: Secondary | ICD-10-CM | POA: Diagnosis not present

## 2017-02-17 DIAGNOSIS — J189 Pneumonia, unspecified organism: Secondary | ICD-10-CM | POA: Diagnosis present

## 2017-02-17 DIAGNOSIS — Z8572 Personal history of non-Hodgkin lymphomas: Secondary | ICD-10-CM | POA: Diagnosis not present

## 2017-02-17 DIAGNOSIS — R410 Disorientation, unspecified: Secondary | ICD-10-CM | POA: Diagnosis not present

## 2017-02-17 DIAGNOSIS — M6281 Muscle weakness (generalized): Secondary | ICD-10-CM | POA: Diagnosis not present

## 2017-02-17 DIAGNOSIS — T17908A Unspecified foreign body in respiratory tract, part unspecified causing other injury, initial encounter: Secondary | ICD-10-CM

## 2017-02-17 DIAGNOSIS — R0603 Acute respiratory distress: Secondary | ICD-10-CM | POA: Diagnosis not present

## 2017-02-17 DIAGNOSIS — R296 Repeated falls: Secondary | ICD-10-CM | POA: Diagnosis present

## 2017-02-17 DIAGNOSIS — Z85028 Personal history of other malignant neoplasm of stomach: Secondary | ICD-10-CM | POA: Diagnosis not present

## 2017-02-17 DIAGNOSIS — R0682 Tachypnea, not elsewhere classified: Secondary | ICD-10-CM | POA: Diagnosis not present

## 2017-02-17 DIAGNOSIS — Z885 Allergy status to narcotic agent status: Secondary | ICD-10-CM

## 2017-02-17 DIAGNOSIS — I5042 Chronic combined systolic (congestive) and diastolic (congestive) heart failure: Secondary | ICD-10-CM | POA: Diagnosis not present

## 2017-02-17 DIAGNOSIS — E039 Hypothyroidism, unspecified: Secondary | ICD-10-CM | POA: Diagnosis not present

## 2017-02-17 DIAGNOSIS — R52 Pain, unspecified: Secondary | ICD-10-CM | POA: Diagnosis not present

## 2017-02-17 DIAGNOSIS — Z515 Encounter for palliative care: Secondary | ICD-10-CM | POA: Diagnosis not present

## 2017-02-17 DIAGNOSIS — F419 Anxiety disorder, unspecified: Secondary | ICD-10-CM | POA: Diagnosis present

## 2017-02-17 DIAGNOSIS — Z87891 Personal history of nicotine dependence: Secondary | ICD-10-CM | POA: Diagnosis not present

## 2017-02-17 DIAGNOSIS — Z66 Do not resuscitate: Secondary | ICD-10-CM | POA: Diagnosis present

## 2017-02-17 DIAGNOSIS — D649 Anemia, unspecified: Secondary | ICD-10-CM | POA: Diagnosis present

## 2017-02-17 DIAGNOSIS — Z950 Presence of cardiac pacemaker: Secondary | ICD-10-CM

## 2017-02-17 DIAGNOSIS — Z79890 Hormone replacement therapy (postmenopausal): Secondary | ICD-10-CM | POA: Diagnosis not present

## 2017-02-17 DIAGNOSIS — Z7951 Long term (current) use of inhaled steroids: Secondary | ICD-10-CM | POA: Diagnosis not present

## 2017-02-17 DIAGNOSIS — I509 Heart failure, unspecified: Secondary | ICD-10-CM | POA: Diagnosis not present

## 2017-02-17 DIAGNOSIS — N184 Chronic kidney disease, stage 4 (severe): Secondary | ICD-10-CM | POA: Diagnosis present

## 2017-02-17 DIAGNOSIS — L89152 Pressure ulcer of sacral region, stage 2: Secondary | ICD-10-CM | POA: Diagnosis not present

## 2017-02-17 DIAGNOSIS — L89312 Pressure ulcer of right buttock, stage 2: Secondary | ICD-10-CM | POA: Diagnosis present

## 2017-02-17 DIAGNOSIS — E871 Hypo-osmolality and hyponatremia: Secondary | ICD-10-CM | POA: Diagnosis not present

## 2017-02-17 DIAGNOSIS — J90 Pleural effusion, not elsewhere classified: Secondary | ICD-10-CM | POA: Diagnosis not present

## 2017-02-17 DIAGNOSIS — I10 Essential (primary) hypertension: Secondary | ICD-10-CM | POA: Diagnosis not present

## 2017-02-17 DIAGNOSIS — J9611 Chronic respiratory failure with hypoxia: Secondary | ICD-10-CM | POA: Diagnosis not present

## 2017-02-17 DIAGNOSIS — Z7401 Bed confinement status: Secondary | ICD-10-CM

## 2017-02-17 DIAGNOSIS — N390 Urinary tract infection, site not specified: Secondary | ICD-10-CM | POA: Diagnosis present

## 2017-02-17 DIAGNOSIS — M359 Systemic involvement of connective tissue, unspecified: Secondary | ICD-10-CM | POA: Diagnosis present

## 2017-02-17 DIAGNOSIS — J9601 Acute respiratory failure with hypoxia: Secondary | ICD-10-CM | POA: Diagnosis present

## 2017-02-17 DIAGNOSIS — I519 Heart disease, unspecified: Secondary | ICD-10-CM | POA: Diagnosis not present

## 2017-02-17 DIAGNOSIS — I4581 Long QT syndrome: Secondary | ICD-10-CM | POA: Diagnosis present

## 2017-02-17 DIAGNOSIS — K7460 Unspecified cirrhosis of liver: Secondary | ICD-10-CM

## 2017-02-17 DIAGNOSIS — Z7189 Other specified counseling: Secondary | ICD-10-CM

## 2017-02-17 DIAGNOSIS — R609 Edema, unspecified: Secondary | ICD-10-CM | POA: Diagnosis not present

## 2017-02-17 DIAGNOSIS — I130 Hypertensive heart and chronic kidney disease with heart failure and stage 1 through stage 4 chronic kidney disease, or unspecified chronic kidney disease: Secondary | ICD-10-CM | POA: Diagnosis present

## 2017-02-17 DIAGNOSIS — R682 Dry mouth, unspecified: Secondary | ICD-10-CM | POA: Diagnosis not present

## 2017-02-17 DIAGNOSIS — T17908D Unspecified foreign body in respiratory tract, part unspecified causing other injury, subsequent encounter: Secondary | ICD-10-CM | POA: Diagnosis not present

## 2017-02-17 DIAGNOSIS — K5900 Constipation, unspecified: Secondary | ICD-10-CM | POA: Diagnosis not present

## 2017-02-17 DIAGNOSIS — R339 Retention of urine, unspecified: Secondary | ICD-10-CM | POA: Diagnosis present

## 2017-02-17 DIAGNOSIS — J690 Pneumonitis due to inhalation of food and vomit: Secondary | ICD-10-CM | POA: Diagnosis not present

## 2017-02-17 DIAGNOSIS — R4702 Dysphasia: Secondary | ICD-10-CM | POA: Diagnosis present

## 2017-02-17 DIAGNOSIS — R9431 Abnormal electrocardiogram [ECG] [EKG]: Secondary | ICD-10-CM | POA: Diagnosis not present

## 2017-02-17 DIAGNOSIS — R0602 Shortness of breath: Secondary | ICD-10-CM | POA: Diagnosis not present

## 2017-02-17 LAB — BASIC METABOLIC PANEL
ANION GAP: 8 (ref 5–15)
ANION GAP: 10 (ref 5–15)
BUN: 16 mg/dL (ref 6–20)
BUN: 16 mg/dL (ref 6–20)
CALCIUM: 7.9 mg/dL — AB (ref 8.9–10.3)
CO2: 24 mmol/L (ref 22–32)
CO2: 23 mmol/L (ref 22–32)
Calcium: 8.1 mg/dL — ABNORMAL LOW (ref 8.9–10.3)
Chloride: 87 mmol/L — ABNORMAL LOW (ref 101–111)
Chloride: 88 mmol/L — ABNORMAL LOW (ref 101–111)
Creatinine, Ser: 1.25 mg/dL — ABNORMAL HIGH (ref 0.61–1.24)
Creatinine, Ser: 1.22 mg/dL (ref 0.61–1.24)
GFR, EST AFRICAN AMERICAN: 56 mL/min — AB (ref >60)
GFR, EST AFRICAN AMERICAN: 57 mL/min — AB (ref >60)
GFR, EST NON AFRICAN AMERICAN: 48 mL/min — AB (ref >60)
GFR, EST NON AFRICAN AMERICAN: 50 mL/min — AB (ref >60)
GLUCOSE: 94 mg/dL (ref 65–99)
GLUCOSE: 89 mg/dL (ref 65–99)
POTASSIUM: 4.2 mmol/L (ref 3.5–5.1)
POTASSIUM: 4.1 mmol/L (ref 3.5–5.1)
Sodium: 119 mmol/L — CL (ref 135–145)
Sodium: 121 mmol/L — ABNORMAL LOW (ref 135–145)

## 2017-02-17 LAB — BLOOD GAS, ARTERIAL
ACID-BASE DEFICIT: 3.3 mmol/L — AB (ref 0.0–2.0)
BICARBONATE: 22.1 mmol/L (ref 20.0–28.0)
DRAWN BY: 270211
FIO2: 1
O2 SAT: 99.8 %
PATIENT TEMPERATURE: 98.6
pCO2 arterial: 43.6 mmHg (ref 32.0–48.0)
pH, Arterial: 7.325 — ABNORMAL LOW (ref 7.350–7.450)
pO2, Arterial: 308 mmHg — ABNORMAL HIGH (ref 83.0–108.0)

## 2017-02-17 LAB — CBC
HEMATOCRIT: 31.3 % — AB (ref 39.0–52.0)
Hemoglobin: 11 g/dL — ABNORMAL LOW (ref 13.0–17.0)
MCH: 31.6 pg (ref 26.0–34.0)
MCHC: 35.1 g/dL (ref 30.0–36.0)
MCV: 89.9 fL (ref 78.0–100.0)
Platelets: 201 10*3/uL (ref 150–400)
RBC: 3.48 MIL/uL — AB (ref 4.22–5.81)
RDW: 13.8 % (ref 11.5–15.5)
WBC: 15 10*3/uL — AB (ref 4.0–10.5)

## 2017-02-17 LAB — MRSA PCR SCREENING: MRSA BY PCR: POSITIVE — AB

## 2017-02-17 LAB — I-STAT TROPONIN, ED: Troponin i, poc: 0.03 ng/mL (ref 0.00–0.08)

## 2017-02-17 LAB — D-DIMER, QUANTITATIVE (NOT AT ARMC): D DIMER QUANT: 1.89 ug{FEU}/mL — AB (ref 0.00–0.50)

## 2017-02-17 LAB — BRAIN NATRIURETIC PEPTIDE: B NATRIURETIC PEPTIDE 5: 481.1 pg/mL — AB (ref 0.0–100.0)

## 2017-02-17 MED ORDER — TAMSULOSIN HCL 0.4 MG PO CAPS
0.4000 mg | ORAL_CAPSULE | Freq: Every evening | ORAL | Status: DC
  Administered 2017-02-18: 0.4 mg via ORAL
  Filled 2017-02-17: qty 1

## 2017-02-17 MED ORDER — CHLORHEXIDINE GLUCONATE CLOTH 2 % EX PADS
6.0000 | MEDICATED_PAD | Freq: Every day | CUTANEOUS | Status: DC
  Administered 2017-02-18 – 2017-02-19 (×2): 6 via TOPICAL

## 2017-02-17 MED ORDER — LEVOTHYROXINE SODIUM 112 MCG PO TABS
112.0000 ug | ORAL_TABLET | Freq: Every day | ORAL | Status: DC
  Filled 2017-02-17: qty 1

## 2017-02-17 MED ORDER — VANCOMYCIN HCL 10 G IV SOLR
1500.0000 mg | Freq: Once | INTRAVENOUS | Status: AC
  Administered 2017-02-17: 1500 mg via INTRAVENOUS
  Filled 2017-02-17: qty 1500

## 2017-02-17 MED ORDER — FLUTICASONE PROPIONATE 50 MCG/ACT NA SUSP
2.0000 | Freq: Every day | NASAL | Status: DC
  Administered 2017-02-18 – 2017-02-19 (×2): 2 via NASAL
  Filled 2017-02-17: qty 16

## 2017-02-17 MED ORDER — SODIUM CHLORIDE 0.9 % IV BOLUS (SEPSIS)
250.0000 mL | Freq: Once | INTRAVENOUS | Status: AC
  Administered 2017-02-17: 250 mL via INTRAVENOUS

## 2017-02-17 MED ORDER — DONEPEZIL HCL 10 MG PO TABS: 10.0000 mg | ORAL_TABLET | Freq: Every day | ORAL | Status: DC

## 2017-02-17 MED ORDER — MUPIROCIN 2 % EX OINT
1.0000 "application " | TOPICAL_OINTMENT | Freq: Two times a day (BID) | CUTANEOUS | Status: DC
  Administered 2017-02-17 – 2017-02-19 (×4): 1 via NASAL
  Filled 2017-02-17 (×2): qty 22

## 2017-02-17 MED ORDER — SODIUM CHLORIDE 0.9 % IV SOLN: INTRAVENOUS | Status: DC

## 2017-02-17 MED ORDER — VANCOMYCIN HCL 10 G IV SOLR: 1250.0000 mg | INTRAVENOUS | Status: DC

## 2017-02-17 MED ORDER — ACETAMINOPHEN 500 MG PO TABS
500.0000 mg | ORAL_TABLET | Freq: Two times a day (BID) | ORAL | Status: DC
  Filled 2017-02-17: qty 1

## 2017-02-17 MED ORDER — CEFEPIME HCL 2 G IJ SOLR
2.0000 g | Freq: Once | INTRAMUSCULAR | Status: AC
  Administered 2017-02-17: 2 g via INTRAVENOUS
  Filled 2017-02-17: qty 2

## 2017-02-17 MED ORDER — ALBUTEROL SULFATE (2.5 MG/3ML) 0.083% IN NEBU: 2.5000 mg | INHALATION_SOLUTION | RESPIRATORY_TRACT | Status: DC | PRN

## 2017-02-17 MED ORDER — METRONIDAZOLE IN NACL 5-0.79 MG/ML-% IV SOLN
500.0000 mg | Freq: Three times a day (TID) | INTRAVENOUS | Status: DC
  Administered 2017-02-17 – 2017-02-18 (×2): 500 mg via INTRAVENOUS
  Filled 2017-02-17 (×4): qty 100

## 2017-02-17 MED ORDER — ENOXAPARIN SODIUM 30 MG/0.3ML ~~LOC~~ SOLN
30.0000 mg | SUBCUTANEOUS | Status: DC
  Administered 2017-02-17: 30 mg via SUBCUTANEOUS
  Filled 2017-02-17: qty 0.3

## 2017-02-17 MED ORDER — DEXTROSE 5 % IV SOLN
2.0000 g | INTRAVENOUS | Status: DC
  Administered 2017-02-18: 2 g via INTRAVENOUS
  Filled 2017-02-17: qty 2

## 2017-02-17 MED ORDER — SODIUM CHLORIDE 0.9 % IV SOLN
INTRAVENOUS | Status: DC
  Administered 2017-02-17 – 2017-02-18 (×2): via INTRAVENOUS

## 2017-02-17 MED ORDER — PANTOPRAZOLE SODIUM 40 MG PO TBEC
40.0000 mg | DELAYED_RELEASE_TABLET | Freq: Every day | ORAL | Status: DC
Start: 2017-02-18 — End: 2017-02-18
  Filled 2017-02-17: qty 1

## 2017-02-17 MED ORDER — MORPHINE SULFATE (PF) 4 MG/ML IV SOLN: 0.5000 mg | INTRAVENOUS | Status: DC | PRN

## 2017-02-17 MED ORDER — ISOSORBIDE DINITRATE 5 MG PO TABS
5.0000 mg | ORAL_TABLET | Freq: Three times a day (TID) | ORAL | Status: DC
  Filled 2017-02-17 (×4): qty 1

## 2017-02-17 NOTE — Assessment & Plan Note (Signed)
[facility] - needs O2 Treat CHF

## 2017-02-17 NOTE — ED Provider Notes (Signed)
[name] [name]   CSN: [id number] Arrival date & time: 02/17/17  1143     History   Chief Complaint Chief Complaint  Patient presents with  . Shortness of Breath  . Cough  . Weakness    HPI [name] [name] is a 83 y.o. male.  HPI Patient was recently admitted to the hospital on November 11.  At that time he was diagnosed with dysphagia and a urinary tract infection.  Patient had a Foley catheter placed during that hospitalization.  He also had an endoscopy.  Patient was ultimately discharged.  He went to his primary care doctor's office today for follow-up evaluation.  Since the hospitalization the patient's had worsening shortness of breath, cough confusion and weakness.  He is having more difficulty with his swallowing.  Patient was seen by Dr. Alain Marion.  He was hypoxic in the office.  Dr. Alain Marion had a discussion with patient's family.  He is recommending inpatient hospice referral.  Family is on board with this.  They are not interested in any aggressive measures.  Patient denies any pain. Past Medical History:  Diagnosis Date  . Cancer ([place])    Stomach  . CHF (congestive heart failure) ([place])   . Collagen vascular disease ([place])   . Hypertension     Patient Active Problem List   Diagnosis Date Noted  . DNR (do not resuscitate) 02/17/2017  . Dysphagia   . Abnormality of esophagus   . Hyponatremia 02/02/2017  . Neck pain 01/09/2017  . Acute nonintractable headache 01/09/2017  . Head injury 01/09/2017  . Closed nondisplaced fracture of first cervical vertebra ([place]) 01/09/2017  . Liver cirrhosis ([place]) 06/17/2016  . Chronic kidney disease, stage IV (severe) ([place]) 06/17/2016  . Dry skin dermatitis 06/12/2016  . Preventive measure 12/18/2015  . Elevated alkaline phosphatase level 12/18/2015  . Rash, skin 08/26/2015  . Diarrhea 05/19/2015  . UTI (urinary tract infection) 05/19/2015  . Monocytosis 05/12/2015  . Urinary  tract infection, site not specified 04/06/2015  . Stomach pain 04/06/2015  . Cough 03/28/2015  . Pressure ulcer 03/13/2015  . Dyslipidemia 03/12/2015  . Leukocytosis 03/12/2015  . Chronic combined systolic and diastolic CHF (congestive heart failure) ([place]) 03/12/2015  . CKD (chronic kidney disease) stage 3, GFR 30-59 ml/min (H[facility]) 03/12/2015  . Chronic respiratory failure with hypoxia ([place]) 03/12/2015  . Right femoral fracture, closed, initial encounter 03/12/2015  . Fracture of proximal end of right femur ([place]) 03/11/2015  . History of B-cell lymphoma 10/27/2014  . Thrombocytopenia ([place]) 10/26/2014  . Anemia in chronic renal disease 06/18/2014  . Automatic implantable cardioverter-defibrillator in situ 12/30/2008  . Memory loss 06/21/2008  . LOW BACK PAIN 06/15/2007  . Hypothyroidism 01/08/2007  . Essential hypertension 01/08/2007    Past Surgical History:  Procedure Laterality Date  . APPENDECTOMY    . COLONOSCOPY  12/1998, 02/2004   diverticulosois, external hemorrhoids  . CRANIOTOMY Left   . EP IMPLANTABLE DEVICE N/A 06/12/2015   Procedure: Pacemaker Implant ;  Surgeon: Evans Lance, MD;  Location: [facility];  Service: Cardiovascular;  Laterality: N/A;  . ESOPHAGEAL MANOMETRY N/A 02/05/2017   Procedure: ESOPHAGEAL MANOMETRY (EM);  Surgeon: Irene Shipper, MD;  Location: [facility];  Service: Endoscopy;  Laterality: N/A;  inpatient case. patient in 80  . ESOPHAGOGASTRODUODENOSCOPY  06/17/2014   Dr. Oneida Alar: 1. Patent stricture at the gastroesophageal junction 2. UGIB Due to multiple  gastric ulcers 3. Moderate Duodentitis. Negative H.pylori  .  ESOPHAGOGASTRODUODENOSCOPY N/A 10/17/2014   Procedure: ESOPHAGOGASTRODUODENOSCOPY (EGD);  Surgeon: Danie Binder, MD;  [facility];  Service: Endoscopy;  Laterality: N/A;  1030  . ESOPHAGOGASTRODUODENOSCOPY N/A 02/10/2015   Procedure: ESOPHAGOGASTRODUODENOSCOPY (EGD);  Surgeon: Danie Binder, MD;  [facility];  Service: Endoscopy;  Laterality: N/A;  115  . ESOPHAGOGASTRODUODENOSCOPY (EGD) WITH PROPOFOL N/A 02/04/2017   Procedure: ESOPHAGOGASTRODUODENOSCOPY (EGD) WITH PROPOFOL;  Surgeon: Irene Shipper, MD;  Location: [facility];  Service: Endoscopy;  Laterality: N/A;  . ESOPHAGOGASTRODUODENOSCOPY (EGD) WITH PROPOFOL N/A 02/07/2017   Procedure: ESOPHAGOGASTRODUODENOSCOPY (EGD) WITH PROPOFOL;  Surgeon: Doran Stabler, MD;  Location: [facility];  Service: Gastroenterology;  Laterality: N/A;  . heart disease     permanent pacemaker  . HIP FRACTURE SURGERY  2011   [facility]  R.  . INGUINAL HERNIA REPAIR    . IR GENERIC HISTORICAL  10/26/2015   IR RADIOLOGIST EVAL & MGMT 10/26/2015 [facility]  . PACEMAKER INSERTION         Home Medications    Prior to Admission medications   Medication Sig Start Date End Date Taking? Authorizing Provider  acetaminophen (TYLENOL) 500 MG tablet Take 1,000 mg by mouth 2 (two) times daily.    Yes [provider]  albuterol (PROVENTIL) (2.5 MG/3ML) 0.083% nebulizer solution USE ONE VIAL VIA NEBULIZER FOUR TIMES DAILY AS NEEDED. 11/22/16  Yes Plotnikov, Evie Lacks, MD  Artificial Tear Ointment (DRY EYES OP) Apply 1 drop to eye daily.   Yes [provider]  atorvastatin (LIPITOR) 20 MG tablet TAKE 1/2 TABLET BY MOUTH TWICE DAILY 01/02/17  Yes Plotnikov, Evie Lacks, MD  calcium carbonate (OSCAL) 1500 (600 Ca) MG TABS tablet Take 600 mg of elemental calcium 2 (two) times daily with a meal by mouth.   Yes [provider]  Cholecalciferol (VITAMIN D) 2000 units CAPS Take 2,000 Units daily by mouth.   Yes [provider]  diltiazem 2 % GEL Apply 1 application topically 3 (three) times daily. 06/12/16  Yes Plotnikov, Evie Lacks, MD  diphenoxylate-atropine (LOMOTIL) 2.5-0.025 MG tablet Take 1 tablet by mouth 4 (four) times daily as needed for diarrhea or loose stools. 06/07/15  Yes Plotnikov, Evie Lacks, MD  docusate sodium (COLACE) 100 MG  capsule Take 100 mg daily as needed by mouth for mild constipation.   Yes [provider]  donepezil (ARICEPT) 10 MG tablet TAKE 1 TABLET BY MOUTH EVERY NIGHT AT BEDTIME 02/03/17  Yes Plotnikov, Evie Lacks, MD  fluticasone (FLONASE) 50 MCG/ACT nasal spray Place 2 sprays into both nostrils daily. 05/26/14  Yes Plotnikov, Evie Lacks, MD  HYDROcodone-acetaminophen (NORCO/VICODIN) 5-325 MG tablet Take 1 tablet every 4 (four) hours as needed by mouth for moderate pain. 02/01/17  Yes Jola Schmidt, MD  isosorbide dinitrate (ISORDIL) 5 MG tablet Take 1 tablet (5 mg total) 3 (three) times daily by mouth. 02/09/17 03/11/17 Yes Elodia Florence., MD  levothyroxine (SYNTHROID, LEVOTHROID) 112 MCG tablet TAKE 1 TABLET BY MOUTH DAILY 12/12/16  Yes Plotnikov, Evie Lacks, MD  multivitamin-iron-minerals-folic acid (CENTRUM) chewable tablet Chew 1 tablet by mouth daily.   Yes [provider]  pantoprazole (PROTONIX) 40 MG tablet Take 1 tablet (40 mg total) by mouth daily. 11/05/16  Yes Plotnikov, Evie Lacks, MD  tamsulosin (FLOMAX) 0.4 MG CAPS capsule Take 1 capsule (0.4 mg total) every evening by mouth. 02/09/17  Yes Elodia Florence., MD  triamcinolone (NASACORT AQ) 55 MCG/ACT AERO nasal inhaler Place 2  sprays into the nose daily. 12/20/15  Yes Plotnikov, Evie Lacks, MD  triamcinolone ointment (KENALOG) 0.1 % Apply 1 application topically 2 (two) times daily. 06/12/16  Yes Plotnikov, Evie Lacks, MD  AMBULATORY NON FORMULARY MEDICATION Diltiazem gel 2% Apply small amount pea-sized to anal area 2-3 times a day for anal stenosis. Use as needed. Patient not taking: Reported on 02/17/2017 06/07/15   Plotnikov, Evie Lacks, MD  nitroGLYCERIN (NITROSTAT) 0.4 MG SL tablet Place 1 tablet (0.4 mg total) under the tongue every 5 (five) minutes as needed. Chest pain 03/13/16 03/12/18  Plotnikov, Evie Lacks, MD    Family History Family History  Problem Relation Age of Onset  . Cancer Sister   . Coronary artery  disease Unknown        male 1st degree relative<60  . Hypertension Unknown     Social History Social History   Tobacco Use  . Smoking status: Former Smoker    Packs/day: 1.00    Years: 30.00    Pack years: 30.00    Last attempt to quit: 10/26/1976    Years since quitting: 40.3  . Smokeless tobacco: Never Used  Substance Use Topics  . Alcohol use: No  . Drug use: No     Allergies   Aleve [naproxen sodium]; Ibuprofen; and Tramadol   Review of Systems Review of Systems  Constitutional: Positive for fatigue.  HENT: Positive for trouble swallowing.   Respiratory: Positive for shortness of breath.   All other systems reviewed and are negative.    Physical Exam Updated Vital Signs BP (!) 146/69   Pulse 64   Temp 99.5 F (37.5 C) (Rectal)   Resp 19   Wt 67.6 kg (149 lb)   SpO2 99%   BMI 25.58 kg/m   Physical Exam  Constitutional: No distress.  Elderly, frail  HENT:  Head: Normocephalic and atraumatic.  Right Ear: External ear normal.  Left Ear: External ear normal.  Frequently spitting clear mucus  Eyes: Conjunctivae are normal. Right eye exhibits no discharge. Left eye exhibits no discharge. No scleral icterus.  Neck: Neck supple. No tracheal deviation present.  Cardiovascular: Normal rate, regular rhythm and intact distal pulses.  Pulmonary/Chest: Effort normal and breath sounds normal. No stridor. No respiratory distress. He has no wheezes. He has no rales.  Abdominal: Soft. Bowel sounds are normal. He exhibits no distension. There is no tenderness. There is no rebound and no guarding.  Musculoskeletal: He exhibits no tenderness.       Right lower leg: He exhibits edema. He exhibits no tenderness.       Left lower leg: He exhibits no tenderness.  Asymmetric edema of the right lower extremity greater than left  Neurological: He is alert. No cranial nerve deficit (no facial droop, extraocular movements intact, no slurred speech) or sensory deficit. He exhibits  normal muscle tone. He displays no seizure activity.  Generalized weakness  Skin: Skin is warm and dry. No rash noted.  Psychiatric: He has a normal mood and affect.  Nursing [name] and vitals reviewed.    ED Treatments / Results  Labs (all labs ordered are listed, but only abnormal results are displayed) Labs Reviewed  CBC - Abnormal; Notable for the following components:      Result Value   WBC 15.0 (*)    RBC 3.48 (*)    Hemoglobin 11.0 (*)    HCT 31.3 (*)    All other components within normal limits  BASIC METABOLIC PANEL - Abnormal; Notable  for the following components:   Sodium 119 (*)    Chloride 87 (*)    Creatinine, Ser 1.25 (*)    Calcium 8.1 (*)    GFR calc non Af Amer 48 (*)    GFR calc Af Amer 56 (*)    All other components within normal limits  BRAIN NATRIURETIC PEPTIDE - Abnormal; Notable for the following components:   B Natriuretic Peptide 481.1 (*)    All other components within normal limits  I-STAT TROPONIN, ED    EKG  EKG Interpretation  Date/Time:  Monday February 17 2017 12:17:08 EST Ventricular Rate:  61 PR Interval:    QRS Duration: 177 QT Interval:  530 QTC Calculation: 534 R Axis:   -11 Text Interpretation:  Atrial-paced rhythm IVCD, consider atypical LBBB No significant change since last tracing Confirmed by Dorie Rank [phone]) on 02/17/2017 12:19:59 PM       Radiology Dg Chest Port 1 View  Result Date: 02/17/2017 CLINICAL DATA:  83 y/o M; congestive heart failure and shortness of breath. EXAM: PORTABLE CHEST 1 VIEW COMPARISON:  02/08/2017 chest radiograph. FINDINGS: Stable cardiac silhouette given projection and technique. Two lead AICD noted. Left pleural effusion and basilar opacity. Interstitial edema. Aortic atherosclerosis with calcification. No acute osseous abnormality is evident. IMPRESSION: Small left pleural effusion and left basilar opacity which may represent associated atelectasis or pneumonia. Mild interstitial pulmonary  edema. Electronically Signed   By: Kristine Garbe M.D.   On: 02/17/2017 13:44    Procedures .Critical Care Performed by: Dorie Rank, MD Authorized by: Dorie Rank, MD   Critical care provider statement:    Critical care time (minutes):  35   Critical care was time spent personally by me on the following activities:  Discussions with consultants, evaluation of patient's response to treatment, examination of patient, ordering and performing treatments and interventions, ordering and review of laboratory studies, ordering and review of radiographic studies, pulse oximetry, re-evaluation of patient's condition, obtaining history from patient or surrogate and review of old charts   (including critical care time)  Medications Ordered in ED Medications  ceFEPIme (MAXIPIME) 2 g in dextrose 5 % 50 mL IVPB (2 g Intravenous New Bag/Given 02/17/17 1449)  vancomycin (VANCOCIN) 1,500 mg in sodium chloride 0.9 % 500 mL IVPB (not administered)     Initial Impression / Assessment and Plan / ED Course  I have reviewed the triage vital signs and the nursing notes.  Pertinent labs & imaging results that were available during my care of the patient were reviewed by me and considered in my medical decision making (see chart for details).  Patient presented to the emergency room for evaluation of worsening respiratory difficulty.  Patient was recently in the hospital.  He was treated for urinary tract infection and dysphasia.  His symptoms have progressed.  In his doctor's office he was hypoxic and had difficulty with his breathing.  Patient continues to have difficulty with his swallowing.  Bipap ordered for his comfort.  ABx to cover for possible hcap.  Discussed with hospitalist for admission.  Discussed with palliative care regarding consultation hospice/comfort care.   Final Clinical Impressions(s) / ED Diagnoses   Final diagnoses:  Hyponatremia  Respiratory distress  HCAP  (healthcare-associated pneumonia)      Dorie Rank, MD 02/17/17 1512

## 2017-02-17 NOTE — Progress Notes (Signed)
Pharmacy Antibiotic Note  [name] [name] is a 83 y.o. male admitted on 02/17/2017 with pneumonia.  Pharmacy has been consulted for vancomycin and cefepime dosing. Metronidazole per MD.  Recent hospitalization for PNAm dysphagia, S. lugdunensis UTI.   Today, 02/17/2017  Renal: SCr elevated but improved from recent labs  WBC increased  afebrile  Plan:  Vancomycin 1500mg  IV x1 in ED then 1250mg  IV q48h  Cefepime 2gm IV q24h  Monitor renal function with SCr every other day (days vancomycin dose due)  Check vancomycin levels as indicated  Await cultures and ability to narrow antibiotics  Weight: 149 lb (67.6 kg)  Temp (24hrs), Avg:98.9 F (37.2 C), Min:98.3 F (36.8 C), Max:99.5 F (37.5 C)  Recent Labs  Lab 02/17/17 1320  WBC 15.0*  CREATININE 1.25*    Estimated Creatinine Clearance: 31.6 mL/min (A) (by C-G formula based on SCr of 1.25 mg/dL (H)).    Allergies  Allergen Reactions  . Aleve [Naproxen Sodium] Other (See Comments)    Bleeding ulcers  . Ibuprofen Other (See Comments)    Bleeding ulcers  . Tramadol     Hallucinations     Antimicrobials this admission: 11/26 vanco >> 11/26 cefepime >> 11/26 metronidazole  Dose adjustments this admission:  Microbiology results: 11/26 MRSA PCR:  11/26 Bcx:  11/26 UCx:   11/10  UCx: 60K S. lugduenensis (pan-susc) 11/11 UCx: 60K S. lugduenensis (pan-susc) 11/12 MRSA PCR: neg 11/17 BCx: NG-F  Thank you for allowing pharmacy to be a part of this patient's care.  Clovis Riley 02/17/2017 5:51 PM

## 2017-02-17 NOTE — ED Triage Notes (Signed)
Patient was transported from [facility] by [facility]. Per EMS, patient was being seen in the office for a follow up from an admission for UTI and endoscopy. Before admission, patient was able to walk with no assistance. Since discharge, he is having to use a wheelchair. At the PCP office, he was complaining of shortness of breath, generalized weakness, and cough. Initially, he was 84% on room air at the PCP office and they initiated NRB at 3L. EMS has increased the NRB to 15L.

## 2017-02-17 NOTE — Assessment & Plan Note (Signed)
Discussed and confirmed 

## 2017-02-17 NOTE — Assessment & Plan Note (Signed)
Demadex  

## 2017-02-17 NOTE — Progress Notes (Signed)
Subjective:  Patient ID: [name] [name], male    DOB: [birth year]  Age: 83 y.o. MRN: [id number]  CC: No chief complaint on file.   HPI ZURI BRADWAY presents for worsening SOB, cough, confusion, weakness   Outpatient Medications Prior to Visit  Medication Sig Dispense Refill  . acetaminophen (TYLENOL) 500 MG tablet Take 500 mg by mouth every 6 (six) hours as needed for headache.    . albuterol (PROVENTIL) (2.5 MG/3ML) 0.083% nebulizer solution USE ONE VIAL VIA NEBULIZER FOUR TIMES DAILY AS NEEDED. 375 mL 11  . AMBULATORY NON FORMULARY MEDICATION Diltiazem gel 2% Apply small amount pea-sized to anal area 2-3 times a day for anal stenosis. Use as needed. 30 g 5  . Artificial Tear Ointment (DRY EYES OP) Apply 1 drop to eye daily.    Marland Kitchen atorvastatin (LIPITOR) 20 MG tablet TAKE 1/2 TABLET BY MOUTH TWICE DAILY 90 tablet 1  . calcium carbonate (OSCAL) 1500 (600 Ca) MG TABS tablet Take 600 mg of elemental calcium 2 (two) times daily with a meal by mouth.    . Cholecalciferol (VITAMIN D) 2000 units CAPS Take 2,000 Units daily by mouth.    . diltiazem 2 % GEL Apply 1 application topically 3 (three) times daily. 30 g 11  . diphenoxylate-atropine (LOMOTIL) 2.5-0.025 MG tablet Take 1 tablet by mouth 4 (four) times daily as needed for diarrhea or loose stools. 60 tablet 1  . docusate sodium (COLACE) 100 MG capsule Take 100 mg daily as needed by mouth for mild constipation.    Marland Kitchen donepezil (ARICEPT) 10 MG tablet TAKE 1 TABLET BY MOUTH EVERY NIGHT AT BEDTIME 90 tablet 0  . fluticasone (FLONASE) 50 MCG/ACT nasal spray Place 2 sprays into both nostrils daily. 48 g 3  . HYDROcodone-acetaminophen (NORCO/VICODIN) 5-325 MG tablet Take 1 tablet every 4 (four) hours as needed by mouth for moderate pain. 15 tablet 0  . isosorbide dinitrate (ISORDIL) 5 MG tablet Take 1 tablet (5 mg total) 3 (three) times daily by mouth. 90 tablet 0  . levothyroxine (SYNTHROID, LEVOTHROID) 112 MCG tablet TAKE 1 TABLET BY MOUTH  DAILY 90 tablet 0  . multivitamin-iron-minerals-folic acid (CENTRUM) chewable tablet Chew 1 tablet by mouth daily.    . nitroGLYCERIN (NITROSTAT) 0.4 MG SL tablet Place 1 tablet (0.4 mg total) under the tongue every 5 (five) minutes as needed. Chest pain 20 tablet 1  . pantoprazole (PROTONIX) 40 MG tablet Take 1 tablet (40 mg total) by mouth daily. 90 tablet 1  . tamsulosin (FLOMAX) 0.4 MG CAPS capsule Take 1 capsule (0.4 mg total) every evening by mouth. 30 capsule 0  . triamcinolone (NASACORT AQ) 55 MCG/ACT AERO nasal inhaler Place 2 sprays into the nose daily. 3 Inhaler 3  . triamcinolone ointment (KENALOG) 0.1 % Apply 1 application topically 2 (two) times daily. 80 g 2   No facility-administered medications prior to visit.     ROS Review of Systems  Constitutional: Positive for fatigue. Negative for appetite change and unexpected weight change.  HENT: Positive for congestion. Negative for nosebleeds, sneezing, sore throat and trouble swallowing.   Eyes: Negative for itching and visual disturbance.  Respiratory: Positive for apnea, cough, chest tightness, shortness of breath, wheezing and stridor.   Cardiovascular: Positive for leg swelling. Negative for chest pain and palpitations.  Gastrointestinal: Negative for abdominal distention, blood in stool, diarrhea and nausea.  Genitourinary: Negative for frequency and hematuria.  Musculoskeletal: Positive for back pain, gait problem, neck pain and neck stiffness.  Negative for joint swelling.  Skin: Negative for rash.  Neurological: Positive for dizziness and weakness. Negative for tremors and speech difficulty.  Psychiatric/Behavioral: Negative for agitation, dysphoric mood and sleep disturbance. The patient is not nervous/anxious.     Objective:  BP 118/62 (BP Location: Left Arm, Patient Position: Sitting, Cuff Size: Large)   Pulse 61   Temp 98.3 F (36.8 C) (Oral)   SpO2 (!) 84%   PF (!) 3 L/min   BP Readings from Last 3  Encounters:  02/17/17 118/62  02/09/17 122/66  02/01/17 130/80    Wt Readings from Last 3 Encounters:  02/07/17 149 lb (67.6 kg)  01/09/17 147 lb (66.7 kg)  10/25/16 152 lb (68.9 kg)    Physical Exam  Constitutional: He is oriented to person, place, and time. He appears well-developed. No distress.  NAD  HENT:  Mouth/Throat: Oropharynx is clear and moist.  Eyes: Conjunctivae are normal. Pupils are equal, round, and reactive to light.  Neck: Normal range of motion. No JVD present. No thyromegaly present.  Cardiovascular: Normal rate, regular rhythm, normal heart sounds and intact distal pulses. Exam reveals no gallop and no friction rub.  No murmur heard. Pulmonary/Chest: Effort normal. No respiratory distress. He has wheezes. He has rales. He exhibits no tenderness.  Abdominal: Soft. Bowel sounds are normal. He exhibits no distension and no mass. There is no tenderness. There is no rebound and no guarding.  Musculoskeletal: Normal range of motion. He exhibits no edema or tenderness.  Lymphadenopathy:    He has no cervical adenopathy.  Neurological: He is alert and oriented to person, place, and time. He has normal reflexes. No cranial nerve deficit. He exhibits normal muscle tone. He displays a negative Romberg sign. Coordination and gait normal.  Skin: Skin is warm and dry. No rash noted.  Psychiatric: His behavior is normal. Thought content normal.  sleepy, confused, coarse BS, cyanotic fingers, cold hands Garbled speech In a w/c  Lab Results  Component Value Date   WBC 11.8 (H) 02/09/2017   HGB 11.5 (L) 02/09/2017   HCT 33.6 (L) 02/09/2017   PLT 186 02/09/2017   GLUCOSE 108 (H) 02/09/2017   CHOL 110 05/25/2013   TRIG 67.0 05/25/2013   HDL 40.30 05/25/2013   LDLCALC 56 05/25/2013   ALT 19 02/02/2017   AST 29 02/02/2017   NA 134 (L) 02/09/2017   K 4.1 02/09/2017   CL 101 02/09/2017   CREATININE 1.67 (H) 02/09/2017   BUN 37 (H) 02/09/2017   CO2 24 02/09/2017   TSH  4.962 (H) 02/03/2017   PSA 3.42 11/28/2011   INR 1.15 10/12/2015    Dg Esophagus  Result Date: 02/03/2017 CLINICAL DATA:  Dysphagia. EXAM: ESOPHOGRAM/BARIUM SWALLOW TECHNIQUE: Single contrast examination was performed using  thin barium. FLUOROSCOPY TIME:  Fluoroscopy Time:  2.8 minutes Radiation Exposure Index (if provided by the fluoroscopic device): 46.9 mGy COMPARISON:  None. FINDINGS: The esophagus is slightly tortuous with extensive intermittent spasms with severe spasm just above the gastroesophageal junction. There is extensive to and fro peristalsis with contrast extending back into the oropharynx repeatedly, even in the semi-erect position. There is occasional relaxation of severe spasm in the distal esophagus. The lumen during relaxation is approximately 6-7 mm in diameter. No visible masses. Because of the severe distal spasm, I did not give the patient a barium tablet. IMPRESSION: 1. Extensive spasm throughout the esophagus with severe spasm in the distal esophagus just above the gastroesophageal junction. This area does intermittently relax  to a diameter of approximately 6-7 mm. 2. To and fro peristalsis in the upper esophagus with reflux into the oropharynx repeatedly. Electronically Signed   By: Lorriane Shire M.D.   On: 02/03/2017 15:21   Dg Chest Port 1 View  Result Date: 02/02/2017 CLINICAL DATA:  83 year old male with cough and fever. EXAM: PORTABLE CHEST 1 VIEW COMPARISON:  Chest radiograph dated 02/01/2017 FINDINGS: There is shallow inspiration with bibasilar linear atelectasis/ scarring. Stable calcified granuloma at the right lung base. No focal consolidation, pleural effusion, or pneumothorax. The cardiac silhouette is within normal limits. There is atherosclerotic calcification of the aorta. Left pectoral dual lead AICD device. There is osteopenia with degenerative changes of the spine. No acute osseous pathology. IMPRESSION: No active disease. Electronically Signed   By: Anner Crete M.D.   On: 02/02/2017 19:10   Dg Swallowing Func-speech Pathology  Result Date: 02/03/2017 Objective Swallowing Evaluation: Type of Study: MBS-Modified Barium Swallow Study  Patient Details Name: JAKAIDEN FILL MRN: [id number] Date of Birth: 09-08-1922 Today's Date: 02/03/2017 Time: SLP Start Time (ACUTE ONLY): 8182 -SLP Stop Time (ACUTE ONLY): 1420 SLP Time Calculation (min) (ACUTE ONLY): 25 min Past Medical History: Past Medical History: Diagnosis Date . Cancer ([place])   Stomach . CHF (congestive heart failure) ([place])  . Collagen vascular disease ([place])  . Hypertension  Past Surgical History: Past Surgical History: Procedure Laterality Date . APPENDECTOMY   . COLONOSCOPY  12/1998, 02/2004  diverticulosois, external hemorrhoids . CRANIOTOMY Left  . ESOPHAGOGASTRODUODENOSCOPY  06/17/2014  Dr. Oneida Alar: 1. Patent stricture at the gastroesophageal junction 2. UGIB Due to multiple  gastric ulcers 3. Moderate Duodentitis. Negative H.pylori . heart disease    permanent pacemaker . HIP FRACTURE SURGERY  2011  [facility]  R. . INGUINAL HERNIA REPAIR   . IR GENERIC HISTORICAL  10/26/2015  IR RADIOLOGIST EVAL & MGMT 10/26/2015 [facility]-INTERV RAD . PACEMAKER INSERTION   HPI: pt is a 83 yo male adm [facility] with UTI/fever.  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  He does report some difficulties after his stomach surgery in March 2016 to treat his cancer.  CXR negative.  Pt admits to progressive problems with swallowing noted recently but denies sudden onset.  He denies ever having swallow evaluation completed previously.   Subjective: pt awake in chair Assessment / Plan / Recommendation CHL IP CLINICAL IMPRESSIONS 02/03/2017 Clinical Impression Pt presents with functional oral swallow with pharyngocervical esophageal *suspect primary esophageal* deficits.  He was only tested with a few liquid boluses due to concerns for  primary esophageal deficits and need for esophagram.  Premature spillage of boluses into pharynx observed - with swallow triggering at pyriform sinus with thin liquid via tsp.  Trace vallecular/pyriform sinus residual noted with nectar which pt independently dry swallows to clear.  Trace laryngeal penetration of thin before swallow due to decreased timing of laryngeal closure.  Pt did not aspirate but he does demonstrate backflow of liquid barium into pharynx/open larynx.  Upon esophageal sweep, his esophagus appeared full - ? spasms. Trace laryngeal penetration of thin/nectar backflowed material observed with pt conducting further swallows to help clear.  Suspect he is having some aspiration primarily due to his esophageal deficits.  Esophagram to follow, will follow up.  Educated pt to findings/concerns using teach back.   SLP Visit Diagnosis Dysphagia, pharyngoesophageal phase (R13.14);Dysphagia, unspecified (R13.10) Attention and concentration deficit following -- Frontal lobe  and executive function deficit following -- Impact on safety and function Risk for inadequate nutrition/hydration;Moderate aspiration risk   CHL IP TREATMENT RECOMMENDATION 02/03/2017 Treatment Recommendations Therapy as outlined in treatment plan below   Prognosis 02/03/2017 Prognosis for Safe Diet Advancement Guarded Barriers to Reach Goals -- Barriers/Prognosis Comment -- CHL IP DIET RECOMMENDATION 02/03/2017 SLP Diet Recommendations NPO;Ice chips PRN after oral care Liquid Administration via -- Medication Administration -- Compensations -- Postural Changes --   CHL IP OTHER RECOMMENDATIONS 02/03/2017 Recommended Consults Consider esophageal assessment Oral Care Recommendations -- Other Recommendations --   CHL IP FOLLOW UP RECOMMENDATIONS 03/16/2015 Follow up Recommendations None   CHL IP FREQUENCY AND DURATION 02/03/2017 Speech Therapy Frequency (ACUTE ONLY) min 1 x/week Treatment Duration 1 week      CHL IP ORAL PHASE 02/03/2017 Oral  Phase Impaired Oral - Pudding Teaspoon -- Oral - Pudding Cup -- Oral - Honey Teaspoon -- Oral - Honey Cup -- Oral - Nectar Teaspoon -- Oral - Nectar Cup -- Oral - Nectar Straw WFL Oral - Thin Teaspoon WFL Oral - Thin Cup -- Oral - Thin Straw WFL Oral - Puree -- Oral - Mech Soft -- Oral - Regular -- Oral - Multi-Consistency -- Oral - Pill -- Oral Phase - Comment --  CHL IP PHARYNGEAL PHASE 02/03/2017 Pharyngeal Phase WFL Pharyngeal- Pudding Teaspoon -- Pharyngeal -- Pharyngeal- Pudding Cup -- Pharyngeal -- Pharyngeal- Honey Teaspoon -- Pharyngeal -- Pharyngeal- Honey Cup -- Pharyngeal -- Pharyngeal- Nectar Teaspoon -- Pharyngeal -- Pharyngeal- Nectar Cup -- Pharyngeal -- Pharyngeal- Nectar Straw WFL;Delayed swallow initiation-pyriform sinuses;Pharyngeal residue - valleculae;Pharyngeal residue - pyriform Pharyngeal -- Pharyngeal- Thin Teaspoon WFL Pharyngeal -- Pharyngeal- Thin Cup -- Pharyngeal -- Pharyngeal- Thin Straw WFL;Penetration/Apiration after swallow;Penetration/Aspiration before swallow;Reduced airway/laryngeal closure;Reduced laryngeal elevation;Pharyngeal residue - valleculae;Pharyngeal residue - pyriform Pharyngeal Material enters airway, remains ABOVE vocal cords then ejected out;Material enters airway, remains ABOVE vocal cords and not ejected out Pharyngeal- Puree -- Pharyngeal -- Pharyngeal- Mechanical Soft -- Pharyngeal -- Pharyngeal- Regular -- Pharyngeal -- Pharyngeal- Multi-consistency -- Pharyngeal -- Pharyngeal- Pill -- Pharyngeal -- Pharyngeal Comment --  CHL IP CERVICAL ESOPHAGEAL PHASE 02/03/2017 Cervical Esophageal Phase Impaired Pudding Teaspoon -- Pudding Cup -- Honey Teaspoon -- Honey Cup -- Nectar Teaspoon -- Nectar Cup -- Nectar Straw Esophageal backflow into the pharynx;Esophageal backflow into cervical esophagus;Reduced cricopharyngeal relaxation Thin Teaspoon Esophageal backflow into the pharynx;Esophageal backflow into cervical esophagus Thin Cup -- Thin Straw Esophageal backflow  into the pharynx;Esophageal backflow into cervical esophagus Puree -- Mechanical Soft -- Regular -- Multi-consistency -- Pill -- Cervical Esophageal Comment ? reduced relaxation of cricopharyngeus and esophageal backflow into pharynx, pt conducts multiple swallows CHL IP GO 02/03/2017 Functional Assessment Tool Used clinical judgement Functional Limitations Swallowing Swallow Current Status (H6314) CM Swallow Goal Status (H7026) CL Swallow Discharge Status (V7858) (None) Motor Speech Current Status (I5027) (None) Motor Speech Goal Status (X4128) (None) Motor Speech Goal Status (N8676) (None) Spoken Language Comprehension Current Status (H2094) (None) Spoken Language Comprehension Goal Status (B0962) (None) Spoken Language Comprehension Discharge Status [id number]) (None) Spoken Language Expression Current Status [phone]) (None) Spoken Language Expression Goal Status [phone]) (None) Spoken Language Expression Discharge Status [id number]) (None) Attention Current Status (C1275) (None) Attention Goal Status (T7001) (None) Attention Discharge Status (V4944) (None) Memory Current Status (H6759) (None) Memory Goal Status (F6384) (None) Memory Discharge Status (Y6599) (None) Voice Current Status (J5701) (None) Voice Goal Status (X7939) (None) Voice Discharge Status (Q3009) (None) Other Speech-Language Pathology Functional Limitation Current Status (Q3300) (None) Other Speech-Language Pathology Functional  Limitation Goal Status (H6314) (None) Other Speech-Language Pathology Functional Limitation Discharge Status [phone]) (None) Luanna Salk, [facility] SLP [phone] Macario Golds 02/03/2017, 4:24 PM               Assessment & Plan:   There are no diagnoses linked to this encounter. I am having Norva Riffle maintain his fluticasone, acetaminophen, Artificial Tear Ointment (DRY EYES OP), AMBULATORY NON FORMULARY MEDICATION, diphenoxylate-atropine, multivitamin-iron-minerals-folic acid, triamcinolone, nitroGLYCERIN,  triamcinolone ointment, diltiazem, pantoprazole, albuterol, levothyroxine, atorvastatin, donepezil, Vitamin D, calcium carbonate, docusate sodium, HYDROcodone-acetaminophen, tamsulosin, and isosorbide dinitrate.  No orders of the defined types were placed in this encounter.    Follow-up: No Follow-up on file.  Walker Kehr, MD

## 2017-02-17 NOTE — Care Management Note (Addendum)
Case Management Note  Patient Details  Name: KESHAN REHA MRN: [id number] Date of Birth: [birth year]  Subjective/Objective:                  83 y.o. male with recent hospital admission, UTI, dysphagia, foley cath c/o worsening SOB, cough, confusion, and weakness.  Action/Plan: From [place].  Chart review shows pt is active with [facility] for RN, PT, NA, SW and has [facility] personal care services. Updated Tim with [facility] to follow pt. Notes show that pt was sent home with ST per last CM note but this CM noted there was never an order for ST.  Based on continuing complaint today, pt will need [place] orders for ST prior to transition to next level of care.  CM will follow as needed.  Expected Discharge Date:   Unknown               Expected Discharge Plan:  [place]  Post Acute Care Choice:  Home Health Choice offered to:  Patient  HH Arranged:  RN PT NA SW [place] Agency:  [facility]  Status of Service:  In process, will continue to follow  Rae Mar, RN 02/17/2017, 1:02 PM

## 2017-02-17 NOTE — Progress Notes (Signed)
A consult was received from an ED physician for vancomycin per pharmacy dosing.  The patient's profile has been reviewed for ht/wt/allergies/indication/available labs.   A one time order has been placed for vancomycin.  Further antibiotics/pharmacy consults should be ordered by admitting physician if indicated.                       Thank you, Clovis Riley 02/17/2017  2:49 PM

## 2017-02-17 NOTE — ED Notes (Signed)
[place] [id number] @ 15:54 call report @ 16:14

## 2017-02-17 NOTE — ED Notes (Signed)
RN notified of abnormal lab 

## 2017-02-17 NOTE — Assessment & Plan Note (Addendum)
Worse Diuretics, O2 DNR Hospice ref - inpatient. Unable to go home - possibly actively dying

## 2017-02-17 NOTE — H&P (Addendum)
History and Physical    [name] [name] VQQ:[id number] DOB: 1923/01/11 DOA: 02/17/2017  PCP: Cassandria Anger, MD  Patient coming from: PCP office  I have personally briefly reviewed patient's old medical records in [place]  Chief Complaint: SOB and AMS  HPI: [name] [name] is [name] [name] 83 y.o. male with medical history significant of heart failure s/p ICD placement, history of B cell lymphoma in remission, CKD, anemia, hypothyroidism, cirrhosis, esophageal dysmotility presenting with worsening shortness of breath and confusion.  History is obtained with assistance from the patient's family as the patient was on BiPAP when I was in the room.  Patient's daughter and son-in-law notes that since discharge she has not seemed any better.  They note that he is constantly coughing and aspirating.  His daughter notes that he has been able to take meds, but has not been able to take much as far as p.o. intake.  His daughter asked him if he wanted to come to the hospital in the middle last week due to respiratory distress, but the patient declined.  Yesterday his daughter noticed increased respiratory distress, which seemed better in the afternoon.  They also noticed confusion which seemed worse today.  Patient was noted to be out of it and mumbling incoherently at times.  His swallowing has not been any better since discharge.  They deny any known fevers, but note increased sleepiness recently as well as persistent cough which has been progressively worse.  He also has that left-sided rib pain which has been persistent since his fall.  He was recently discharged for admission where he was treated for presumptive pneumonia and urinary tract infection.  He also was evaluated by GI for dysphasia.  He had [name] Botox injection during that admission for ill-defined motility disorder.  ED Course: In the emergency department the patient received broad-spectrum antibiotics and was placed on BiPAP for respiratory  distress.  Hospitalist was called for admission.  PCP recommending hospice.  Review of Systems: Limited due to patient mental status  Past Medical History:  Diagnosis Date  . Cancer ([place])    Stomach  . CHF (congestive heart failure) ([place])   . Collagen vascular disease ([place])   . Hypertension     Past Surgical History:  Procedure Laterality Date  . APPENDECTOMY    . COLONOSCOPY  12/1998, 02/2004   diverticulosois, external hemorrhoids  . CRANIOTOMY Left   . EP IMPLANTABLE DEVICE N/Felis Quillin 06/12/2015   Procedure: Pacemaker Implant ;  Surgeon: Evans Lance, MD;  Location: [facility];  Service: Cardiovascular;  Laterality: N/Lennie Dunnigan;  . ESOPHAGEAL MANOMETRY N/Silvanna Ohmer 02/05/2017   Procedure: ESOPHAGEAL MANOMETRY (EM);  Surgeon: Irene Shipper, MD;  Location: [facility];  Service: Endoscopy;  Laterality: N/Alejos Reinhardt;  inpatient case. patient in 7  . ESOPHAGOGASTRODUODENOSCOPY  06/17/2014   Dr. Oneida Alar: 1. Patent stricture at the gastroesophageal junction 2. UGIB Due to multiple  gastric ulcers 3. Moderate Duodentitis. Negative H.pylori  . ESOPHAGOGASTRODUODENOSCOPY N/Jelesa Mangini 10/17/2014   Procedure: ESOPHAGOGASTRODUODENOSCOPY (EGD);  Surgeon: Danie Binder, MD;  [facility];  Service: Endoscopy;  Laterality: N/Mardene Lessig;  1030  . ESOPHAGOGASTRODUODENOSCOPY N/Tucker Minter 02/10/2015   Procedure: ESOPHAGOGASTRODUODENOSCOPY (EGD);  Surgeon: Danie Binder, MD;  [facility];  Service: Endoscopy;  Laterality: N/Jenisa Monty;  115  . ESOPHAGOGASTRODUODENOSCOPY (EGD) WITH PROPOFOL N/Jillann Charette 02/04/2017   Procedure: ESOPHAGOGASTRODUODENOSCOPY (EGD) WITH PROPOFOL;  Surgeon: Irene Shipper, MD;  [facility];  Service: Endoscopy;  Laterality: N/Pasha Broad;  . ESOPHAGOGASTRODUODENOSCOPY (  EGD) WITH PROPOFOL N/Jordany Russett 02/07/2017   Procedure: ESOPHAGOGASTRODUODENOSCOPY (EGD) WITH PROPOFOL;  Surgeon: Doran Stabler, MD;  [facility];  Service: Gastroenterology;  Laterality: N/Niva Murren;  . heart disease     permanent pacemaker  . HIP  FRACTURE SURGERY  2011   ORIF  R.  . INGUINAL HERNIA REPAIR    . IR GENERIC HISTORICAL  10/26/2015   IR RADIOLOGIST EVAL & MGMT 10/26/2015 [facility]  . PACEMAKER INSERTION       reports that he quit smoking about 40 years ago. He has Yardley Lekas 30.00 pack-year smoking history. he has never used smokeless tobacco. He reports that he does not drink alcohol or use drugs.  Allergies  Allergen Reactions  . Aleve [Naproxen Sodium] Other (See Comments)    Bleeding ulcers  . Ibuprofen Other (See Comments)    Bleeding ulcers  . Tramadol     Hallucinations     Family History  Problem Relation Age of Onset  . Cancer Sister   . Coronary artery disease Unknown        male 1st degree relative<60  . Hypertension Unknown      Prior to Admission medications   Medication Sig Start Date End Date Taking? Authorizing Provider  acetaminophen (TYLENOL) 500 MG tablet Take 1,000 mg by mouth 2 (two) times daily.    Yes [provider]  albuterol (PROVENTIL) (2.5 MG/3ML) 0.083% nebulizer solution USE ONE VIAL VIA NEBULIZER FOUR TIMES DAILY AS NEEDED. 11/22/16  Yes Plotnikov, Evie Lacks, MD  Artificial Tear Ointment (DRY EYES OP) Apply 1 drop to eye daily.   Yes [provider]  atorvastatin (LIPITOR) 20 MG tablet TAKE 1/2 TABLET BY MOUTH TWICE DAILY 01/02/17  Yes Plotnikov, Evie Lacks, MD  calcium carbonate (OSCAL) 1500 (600 Ca) MG TABS tablet Take 600 mg of elemental calcium 2 (two) times daily with Jadelynn Boylan meal by mouth.   Yes [provider]  Cholecalciferol (VITAMIN D) 2000 units CAPS Take 2,000 Units daily by mouth.   Yes [provider]  diltiazem 2 % GEL Apply 1 application topically 3 (three) times daily. 06/12/16  Yes Plotnikov, Evie Lacks, MD  diphenoxylate-atropine (LOMOTIL) 2.5-0.025 MG tablet Take 1 tablet by mouth 4 (four) times daily as needed for diarrhea or loose stools. 06/07/15  Yes Plotnikov, Evie Lacks, MD  docusate sodium (COLACE) 100 MG capsule Take 100 mg daily as  needed by mouth for mild constipation.   Yes [provider]  donepezil (ARICEPT) 10 MG tablet TAKE 1 TABLET BY MOUTH EVERY NIGHT AT BEDTIME 02/03/17  Yes Plotnikov, Evie Lacks, MD  fluticasone (FLONASE) 50 MCG/ACT nasal spray Place 2 sprays into both nostrils daily. 05/26/14  Yes Plotnikov, Evie Lacks, MD  HYDROcodone-acetaminophen (NORCO/VICODIN) 5-325 MG tablet Take 1 tablet every 4 (four) hours as needed by mouth for moderate pain. 02/01/17  Yes Jola Schmidt, MD  isosorbide dinitrate (ISORDIL) 5 MG tablet Take 1 tablet (5 mg total) 3 (three) times daily by mouth. 02/09/17 03/11/17 Yes Elodia Florence., MD  levothyroxine (SYNTHROID, LEVOTHROID) 112 MCG tablet TAKE 1 TABLET BY MOUTH DAILY 12/12/16  Yes Plotnikov, Evie Lacks, MD  multivitamin-iron-minerals-folic acid (CENTRUM) chewable tablet Chew 1 tablet by mouth daily.   Yes [provider]  pantoprazole (PROTONIX) 40 MG tablet Take 1 tablet (40 mg total) by mouth daily. 11/05/16  Yes Plotnikov, Evie Lacks, MD  tamsulosin (FLOMAX) 0.4 MG CAPS capsule Take 1 capsule (0.4 mg total) every evening by mouth. 02/09/17  Yes  Elodia Florence., MD  triamcinolone (NASACORT AQ) 55 MCG/ACT AERO nasal inhaler Place 2 sprays into the nose daily. 12/20/15  Yes Plotnikov, Evie Lacks, MD  triamcinolone ointment (KENALOG) 0.1 % Apply 1 application topically 2 (two) times daily. 06/12/16  Yes Plotnikov, Evie Lacks, MD  AMBULATORY NON FORMULARY MEDICATION Diltiazem gel 2% Apply small amount pea-sized to anal area 2-3 times Guled Gahan day for anal stenosis. Use as needed. Patient not taking: Reported on 02/17/2017 06/07/15   Plotnikov, Evie Lacks, MD  nitroGLYCERIN (NITROSTAT) 0.4 MG SL tablet Place 1 tablet (0.4 mg total) under the tongue every 5 (five) minutes as needed. Chest pain 03/13/16 03/12/18  Plotnikov, Evie Lacks, MD    Physical Exam: Vitals:   02/17/17 1430 02/17/17 1450 02/17/17 1500 02/17/17 1530  BP: (!) 146/69  127/66 140/68  Pulse: 64  63  62  Resp: 19 (!) 28 17 20   Temp:      TempSrc:      SpO2: 99% 100% 98% 100%  Weight:        Constitutional: Initially uncomfortable on BIPAP, unable to test orientation, once in ICU, patient on [place] and oriented, appeared comfortable Vitals:   02/17/17 1430 02/17/17 1450 02/17/17 1500 02/17/17 1530  BP: (!) 146/69  127/66 140/68  Pulse: 64  63 62  Resp: 19 (!) 28 17 20   Temp:      TempSrc:      SpO2: 99% 100% 98% 100%  Weight:       Eyes: PERRL, lids and conjunctivae normal ENMT: Mucous membranes are moist. Posterior pharynx clear of any exudate or lesions.Normal dentition.  Neck: normal, supple, no masses, no thyromegaly Respiratory: Diffuse rhonchi, initially on BIAPA Cardiovascular: Regular rate and rhythm, no murmurs / rubs / gallops. No extremity edema. 2+ pedal pulses. No carotid bruits.  Abdomen: no tenderness, no masses palpated. No hepatosplenomegaly. Bowel sounds positive.  Musculoskeletal: no clubbing / cyanosis. No joint deformity upper and lower extremities. Good ROM, no contractures. Normal muscle tone.  Skin: no rashes, lesions, ulcers. No induration Neurologic: CN 2-12 grossly intact. Moving all extremities Psychiatric: Normal judgment and insight. Alert and oriented x 3 (after in ICU). Normal mood.   Labs on Admission: I have personally reviewed following labs and imaging studies  CBC: Recent Labs  Lab 02/17/17 1320  WBC 15.0*  HGB 11.0*  HCT 31.3*  MCV 89.9  PLT 539   Basic Metabolic Panel: Recent Labs  Lab 02/17/17 1320  NA 119*  K 4.2  CL 87*  CO2 24  GLUCOSE 94  BUN 16  CREATININE 1.25*  CALCIUM 8.1*   GFR: Estimated Creatinine Clearance: 31.6 mL/min (Coda Mathey) (by C-G formula based on SCr of 1.25 mg/dL (H)). Liver Function Tests: No results for input(s): AST, ALT, ALKPHOS, BILITOT, PROT, ALBUMIN in the last 168 hours. No results for input(s): LIPASE, AMYLASE in the last 168 hours. No results for input(s): AMMONIA in the last 168  hours. Coagulation Profile: No results for input(s): INR, PROTIME in the last 168 hours. Cardiac Enzymes: No results for input(s): CKTOTAL, CKMB, CKMBINDEX, TROPONINI in the last 168 hours. BNP (last 3 results) No results for input(s): PROBNP in the last 8760 hours. HbA1C: No results for input(s): HGBA1C in the last 72 hours. CBG: No results for input(s): GLUCAP in the last 168 hours. Lipid Profile: No results for input(s): CHOL, HDL, LDLCALC, TRIG, CHOLHDL, LDLDIRECT in the last 72 hours. Thyroid Function Tests: No results for input(s): TSH, T4TOTAL, FREET4, T3FREE, THYROIDAB in  the last 72 hours. Anemia Panel: No results for input(s): VITAMINB12, FOLATE, FERRITIN, TIBC, IRON, RETICCTPCT in the last 72 hours. Urine analysis:    Component Value Date/Time   COLORURINE YELLOW 02/08/2017 1620   APPEARANCEUR TURBID ([place]) 02/08/2017 1620   LABSPEC 1.020 02/08/2017 1620   PHURINE 6.0 02/08/2017 1620   GLUCOSEU NEGATIVE 02/08/2017 1620   GLUCOSEU NEGATIVE 08/25/2015 1607   HGBUR MODERATE (Catherene Kaleta) 02/08/2017 1620   BILIRUBINUR NEGATIVE 02/08/2017 1620   KETONESUR NEGATIVE 02/08/2017 1620   PROTEINUR 100 (Fianna Snowball) 02/08/2017 1620   UROBILINOGEN 0.2 10/08/2016 1338   UROBILINOGEN 0.2 08/25/2015 1607   NITRITE NEGATIVE 02/08/2017 1620   LEUKOCYTESUR MODERATE (Akshay Spang) 02/08/2017 1620    Radiological Exams on Admission: Dg Chest Port 1 View  Result Date: 02/17/2017 CLINICAL DATA:  83 y/o M; congestive heart failure and shortness of breath. EXAM: PORTABLE CHEST 1 VIEW COMPARISON:  02/08/2017 chest radiograph. FINDINGS: Stable cardiac silhouette given projection and technique. Two lead AICD noted. Left pleural effusion and basilar opacity. Interstitial edema. Aortic atherosclerosis with calcification. No acute osseous abnormality is evident. IMPRESSION: Small left pleural effusion and left basilar opacity which may represent associated atelectasis or pneumonia. Mild interstitial pulmonary edema.  Electronically Signed   By: Kristine Garbe M.D.   On: 02/17/2017 13:44    EKG: Independently reviewed. Atrial paced rhythm, appears similar to priors, prolonged QT  Assessment/Plan Active Problems:   Pneumonia  Acute Hypoxic Respiratory Failure  HCAP:  X ray with small L pleural effusion and L basilar opacity which may represent atelectasis vs pneumonia as well as mild interstitial pulm edema.  Currently treating for HCAP with broad spectrum abx, covering for aspiration with patient hx with flagyl.  Vanc/cefepime/flagyl 11/26 -  Follow up blood cx (obtained after abx) Will have patient NPO for now, reevaluate in AM after discussion with GI Bipap d/c'd given concern for productive cough and aspiration Urine strep, legionella, and sputum cx ordered Palliative care c/s, discussed potential hospice with PCP  Hyponatremia:  In setting of decreased PO intake with his dysphagia, suspect this is reflective of hypovolemia, though picture difficult with his history of heart failure.  Has elevated BNP, though unclear baseline.  Improved slightly with small bolus, so will continue gently IVF overnight.   Urine Na, osm, and f/u repeat labs in AM  Heart Failure with Reduced EF (EF 30-35%):  BNP elevated and mild interstitial edema as outpatient.   Does not appear grossly volume overloaded and story with decreased PO intake suggested against this as well.  Not on diuretic as outpatient.   - holding diuresis for now, will check repeat echo - hold isordil (started last admission) (has not been on BB or ace/arb due to hypotension per past notes)  Ill defined motility disorder  Dysphagia  Aspiration:  S/p botox injection by GI.  Patient has not notably improved since discharge and with continued aspiration.  Will have patient NPO and discuss with GI in the morning.   Leukocytosis: follow up blood and urine cx (after abx)  Right LEE: RLE swollen more than L, no [place] completed yet, will order d  dimer and if positive, start heparin gtt until [place] can be completed. [ ]  [place] pending  Hypothyroidism: hold synthroid while NPO  Idiopathic hepatic cirrhosis: stable  Frequent Falls with non displaced C1 ring fracture: (has also had L sided rib pain since fall as well) - conservative management  - analgesics  Urinary Retention: Foley in place since last admission, as removing  foley to f/u UA/cx, can trial without foley at this time as well - flomax when able to take PO  Dementia  Delirium:  Likely due to acute illness, but delirium seemed better when I reevaluated in ICU.   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  Anemia: continue to monitor  Prolonged QT: avoid QT prolonging meds  DVT prophylaxis:lovenox Code Status: DNR  Family Communication: daughter and son in law at bedisde  Disposition Plan: pending improvement, GI c/s, goals of care discussion and dispo planning  Consults called: none  Admission status: inpatient    Fayrene Helper MD [facility] Pager [phone]  If 7PM-7AM, please contact night-coverage [email or web address] Password [facility]  02/17/2017, 3:55 PM

## 2017-02-17 NOTE — ED Notes (Signed)
Bed: WA04 Expected date:  Expected time:  Means of arrival:  Comments: EMS- SOB 

## 2017-02-17 NOTE — ED Notes (Signed)
RT at bedside to place Bipap

## 2017-02-18 ENCOUNTER — Other Ambulatory Visit: Payer: Self-pay

## 2017-02-18 ENCOUNTER — Other Ambulatory Visit (HOSPITAL_COMMUNITY): Payer: Medicare Other

## 2017-02-18 ENCOUNTER — Inpatient Hospital Stay (HOSPITAL_COMMUNITY): Payer: Medicare Other

## 2017-02-18 DIAGNOSIS — Z515 Encounter for palliative care: Secondary | ICD-10-CM

## 2017-02-18 DIAGNOSIS — J189 Pneumonia, unspecified organism: Secondary | ICD-10-CM

## 2017-02-18 DIAGNOSIS — R609 Edema, unspecified: Secondary | ICD-10-CM

## 2017-02-18 DIAGNOSIS — J9691 Respiratory failure, unspecified with hypoxia: Secondary | ICD-10-CM

## 2017-02-18 DIAGNOSIS — L8990 Pressure ulcer of unspecified site, unspecified stage: Secondary | ICD-10-CM

## 2017-02-18 LAB — COMPREHENSIVE METABOLIC PANEL
ALK PHOS: 260 U/L — AB (ref 38–126)
ALT: 23 U/L (ref 17–63)
ANION GAP: 10 (ref 5–15)
AST: 30 U/L (ref 15–41)
Albumin: 2.9 g/dL — ABNORMAL LOW (ref 3.5–5.0)
BUN: 16 mg/dL (ref 6–20)
CO2: 21 mmol/L — AB (ref 22–32)
Calcium: 8 mg/dL — ABNORMAL LOW (ref 8.9–10.3)
Chloride: 92 mmol/L — ABNORMAL LOW (ref 101–111)
Creatinine, Ser: 1.11 mg/dL (ref 0.61–1.24)
GFR calc non Af Amer: 56 mL/min — ABNORMAL LOW (ref >60)
Glucose, Bld: 69 mg/dL (ref 65–99)
POTASSIUM: 4.1 mmol/L (ref 3.5–5.1)
SODIUM: 123 mmol/L — AB (ref 135–145)
TOTAL PROTEIN: 5.8 g/dL — AB (ref 6.5–8.1)
Total Bilirubin: 1.3 mg/dL — ABNORMAL HIGH (ref 0.3–1.2)

## 2017-02-18 LAB — CBC
HEMATOCRIT: 30.6 % — AB (ref 39.0–52.0)
Hemoglobin: 10.6 g/dL — ABNORMAL LOW (ref 13.0–17.0)
MCH: 31.6 pg (ref 26.0–34.0)
MCHC: 34.6 g/dL (ref 30.0–36.0)
MCV: 91.3 fL (ref 78.0–100.0)
Platelets: 185 10*3/uL (ref 150–400)
RBC: 3.35 MIL/uL — AB (ref 4.22–5.81)
RDW: 14 % (ref 11.5–15.5)
WBC: 10.1 10*3/uL (ref 4.0–10.5)

## 2017-02-18 LAB — OSMOLALITY: Osmolality: 260 mOsm/kg — ABNORMAL LOW (ref 275–295)

## 2017-02-18 MED ORDER — SODIUM CHLORIDE 0.9 % IV SOLN
1.5000 g | Freq: Four times a day (QID) | INTRAVENOUS | Status: DC
  Administered 2017-02-19 (×2): 1.5 g via INTRAVENOUS
  Filled 2017-02-18 (×3): qty 1.5

## 2017-02-18 MED ORDER — LORAZEPAM 2 MG/ML IJ SOLN: 1.0000 mg | INTRAMUSCULAR | Status: DC | PRN

## 2017-02-18 MED ORDER — SODIUM CHLORIDE 0.9% FLUSH
3.0000 mL | Freq: Two times a day (BID) | INTRAVENOUS | Status: DC
  Administered 2017-02-19: 3 mL via INTRAVENOUS

## 2017-02-18 MED ORDER — POLYVINYL ALCOHOL 1.4 % OP SOLN: 1.0000 [drp] | Freq: Four times a day (QID) | OPHTHALMIC | Status: DC | PRN

## 2017-02-18 MED ORDER — BIOTENE DRY MOUTH MT LIQD: 15.0000 mL | OROMUCOSAL | Status: DC | PRN

## 2017-02-18 MED ORDER — GLYCOPYRROLATE 0.2 MG/ML IJ SOLN: 0.2000 mg | INTRAMUSCULAR | Status: DC | PRN

## 2017-02-18 MED ORDER — LORAZEPAM 1 MG PO TABS: 1.0000 mg | ORAL_TABLET | ORAL | Status: DC | PRN

## 2017-02-18 MED ORDER — SODIUM CHLORIDE 0.9 % IV SOLN
250.0000 mL | INTRAVENOUS | Status: DC | PRN
  Administered 2017-02-19: 250 mL via INTRAVENOUS

## 2017-02-18 MED ORDER — ONDANSETRON HCL 4 MG/2ML IJ SOLN: 4.0000 mg | Freq: Four times a day (QID) | INTRAMUSCULAR | Status: DC | PRN

## 2017-02-18 MED ORDER — SODIUM CHLORIDE 0.9% FLUSH: 3.0000 mL | INTRAVENOUS | Status: DC | PRN

## 2017-02-18 MED ORDER — LORAZEPAM 2 MG/ML PO CONC: 1.0000 mg | ORAL | Status: DC | PRN

## 2017-02-18 MED ORDER — DICLOFENAC SODIUM 1 % TD GEL
2.0000 g | Freq: Four times a day (QID) | TRANSDERMAL | Status: DC
  Administered 2017-02-18 (×2): 2 g via TOPICAL
  Filled 2017-02-18: qty 100

## 2017-02-18 MED ORDER — HALOPERIDOL 0.5 MG PO TABS: 0.5000 mg | ORAL_TABLET | ORAL | Status: DC | PRN

## 2017-02-18 MED ORDER — HALOPERIDOL LACTATE 2 MG/ML PO CONC
0.5000 mg | ORAL | Status: DC | PRN
  Filled 2017-02-18: qty 0.3

## 2017-02-18 MED ORDER — LIDOCAINE 5 % EX PTCH
1.0000 | MEDICATED_PATCH | CUTANEOUS | Status: DC
  Administered 2017-02-18: 1 via TRANSDERMAL
  Filled 2017-02-18 (×2): qty 1

## 2017-02-18 MED ORDER — ACETAMINOPHEN 500 MG PO TABS: 500.0000 mg | ORAL_TABLET | Freq: Four times a day (QID) | ORAL | Status: DC | PRN

## 2017-02-18 MED ORDER — ONDANSETRON 4 MG PO TBDP: 4.0000 mg | ORAL_TABLET | Freq: Four times a day (QID) | ORAL | Status: DC | PRN

## 2017-02-18 MED ORDER — GLYCOPYRROLATE 1 MG PO TABS: 1.0000 mg | ORAL_TABLET | ORAL | Status: DC | PRN

## 2017-02-18 MED ORDER — HALOPERIDOL LACTATE 5 MG/ML IJ SOLN: 0.5000 mg | INTRAMUSCULAR | Status: DC | PRN

## 2017-02-18 MED ORDER — MORPHINE SULFATE (PF) 4 MG/ML IV SOLN
2.0000 mg | INTRAVENOUS | Status: DC | PRN
  Administered 2017-02-18: 2 mg via INTRAVENOUS
  Filled 2017-02-18: qty 1

## 2017-02-18 MED ORDER — FLUCONAZOLE 100 MG PO TABS: 150.0000 mg | ORAL_TABLET | Freq: Once | ORAL | Status: DC

## 2017-02-18 NOTE — Consult Note (Signed)
Consultation Note Date: 02/18/2017   Patient Name: [name] [name]  DOB: 10/23/1922  MRN: [id number]  Age / Sex: 83 y.o., male  PCP: Plotnikov, Evie Lacks, MD Referring Physician: Elodia Florence., *  Reason for Consultation: Establishing goals of care and Hospice Evaluation  HPI/Patient Profile: 83 y.o. male  with past medical history of CHF (ICD in place), CKD, B cell lymphoma, cirrhosis, esophageal dysmotility (s/p botox injection), with recent C1 fracture who was admitted on 02/17/2017 with recurrent aspiration pneumonia.  He was in respiratory distress and required BiPAP as well as ICU care.   Clinical Assessment and Goals of Care:  I have reviewed medical records including EPIC notes, labs and imaging, received report from the care team, assessed the patient and then met at the bedside along with his daughter and son in law  to discuss diagnosis prognosis, [place], EOL wishes, disposition and options.  I introduced Palliative Medicine as specialized medical care for people living with serious illness. It focuses on providing relief from the symptoms and stress of a serious illness. The goal is to improve quality of life for both the patient and the family.  We discussed a brief life review of the patient. 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  He has 1 son and 1 daughter.  His wife passed away at [facility].  He recently has been very sick but insisted on going to thanksgiving dinner where he ate pumpkin pie and had his picture taking with grand children and great grand children.  As far as functional and nutritional status he is no longer able to take PO without aspirating.  He is too weak to walk.  I discussed the patient's recurrent aspiration pneumonia with both the patient and his children.  I explained that this is a terminal illness and that his time may be short.  The patient and his  children elected comfort care and a transfer to [facility].  Questions and concerns were addressed.  The family was given PMT contact information and encouraged to call with any further questions.   Primary Decision Maker:  PATIENT and his daughter.    SUMMARY OF RECOMMENDATIONS     Full comfort  Stop interventions not related to comfort.  Place orders for comfort care and comfort meds  Social work order placed for [place] - patient has been donating to [place] and really wants to go there.  Turn off ICD.  Code Status/Advance Care Planning:  DNR   Symptom Management:  Morphine for SOB, pain, discomfort Ativan for anxiety Robinul for excess secretions Will continue antibiotics until discharge. - discussed with Dr. Florene Glen.  Additional Recommendations (Limitations, Scope, Preferences):  Allow comfort feeds with careful hand feeding.  Patient's family understands he will aspirate  Psycho-social/Spiritual:   Desire for further Chaplaincy support: yes  Prognosis:  2 weeks or less given recurrent aspiration pneumonia, respiratory failure, CHF (EF 30 - 35%),inability to take PO, bed bound    Discharge Planning: Hospice facility  Primary Diagnoses: Present on Admission: . Pneumonia . Leukocytosis . Hypothyroidism . Essential hypertension   I have reviewed the medical record, interviewed the patient and family, and examined the patient. The following aspects are pertinent.  Past Medical History:  Diagnosis Date  . Cancer ([place])    Stomach  . CHF (congestive heart failure) ([place])   . Collagen vascular disease ([place])   . Hypertension    Social History   Socioeconomic History  . Marital status: Widowed    Spouse name: None  . Number of children: 2  . Years of education: None  . Highest education level: None  Social Needs  . Financial resource strain: None  . Food insecurity - worry: None  . Food insecurity - inability:  None  . Transportation needs - medical: None  . Transportation needs - non-medical: None  Occupational History  . Occupation: retired    Fish farm manager: RETIRED  Tobacco Use  . Smoking status: Former Smoker    Packs/day: 1.00    Years: 30.00    Pack years: 30.00    Last attempt to quit: 10/26/1976    Years since quitting: 40.3  . Smokeless tobacco: Never Used  Substance and Sexual Activity  . Alcohol use: No  . Drug use: No  . Sexual activity: Not Currently  Other Topics Concern  . None  Social History Narrative   Regular exercise--no.   Family History  Problem Relation Age of Onset  . Cancer Sister   . Coronary artery disease Unknown        male 1st degree relative<60  . Hypertension Unknown    Scheduled Meds: . acetaminophen  500 mg Oral BID  . Chlorhexidine Gluconate Cloth  6 each Topical Q0600  . fluticasone  2 spray Each Nare Daily  . mupirocin ointment  1 application Nasal BID  . tamsulosin  0.4 mg Oral QPM   Continuous Infusions: . ceFEPime (MAXIPIME) IV 2 g (02/18/17 1000)  . metronidazole 500 mg (02/18/17 0555)  . [START ON 02/19/2017] vancomycin     PRN Meds:.albuterol, morphine injection Allergies  Allergen Reactions  . Aleve [Naproxen Sodium] Other (See Comments)    Bleeding ulcers  . Ibuprofen Other (See Comments)    Bleeding ulcers  . Tramadol     Hallucinations    Review of Systems reports some chest pain, worse with breathing.  Denies pain otherwise.  Physical Exam  Well developed elderly ill male.  Suctioning sputum from his mouth.  Awake, orientated, appropriate CV rrr Resp mildly increased work of breathing on N/C Abdomen soft, nt   Vital Signs: BP (!) 155/71 (BP Location: Right Arm)   Pulse (!) 58   Temp 98.5 F (36.9 C) (Oral)   Resp (!) 21   Ht '5\' 4"'  (1.626 m)   Wt 69.9 kg (154 lb 1.6 oz)   SpO2 98%   BMI 26.45 kg/m  Pain Assessment: No/denies pain   Pain Score: 0-No pain   SpO2: SpO2: 98 % O2 Device:SpO2: 98 % O2 Flow  Rate: .O2 Flow Rate (L/min): 5 L/min  IO: Intake/output summary:   Intake/Output Summary (Last 24 hours) at 02/18/2017 1144 Last data filed at 02/17/2017 2100 Gross per 24 hour  Intake 420 ml  Output 300 ml  Net 120 ml    LBM:   Baseline Weight: Weight: 67.6 kg (149 lb) Most recent weight: Weight: 69.9 kg (154 lb 1.6 oz)     Palliative Assessment/Data: 20%     Time In: 11:00 Time  Out: 12:05 Time Total: 65 min. Greater than 50%  of this time was spent counseling and coordinating care related to the above assessment and plan.  Signed by: Florentina Jenny, PA-C Palliative Medicine Pager: [phone]  Please contact Palliative Medicine Team phone at [phone] for questions and concerns.  For individual provider: See Shea Evans

## 2017-02-18 NOTE — Progress Notes (Signed)
No charge note.  PMT meeting today at 11:00 am  Florentina Jenny, [place] Palliative Medicine Pager: [phone]

## 2017-02-18 NOTE — Consult Note (Signed)
[place] Nurse wound consult note Reason for Consult: Stage 2 pressure injury on the right buttock, ITD to bilateral inguinal areas with fungal overgrowth Wound type: Pressure, moisture Pressure Injury POA: Yes Measurement: 1.5cm round x 0.1cm with dry pink base and no exudate Wound bed:See above Drainage (amount, consistency, odor) See above Periwound:intact, dry.  Macerated in bilateral inguinal areas with satellite lesions Dressing procedure/placement/frequency:Prevalon Boots are provided today. Silicone foam and turning and repositioning will aid the Stage 2 pressure Injury in reepithelialization. Our house antimicrobial textile, [employer] will address the ITD in the bilateral inguinal areas.  If you agree, please order a one-time does of antifungal, eg. Diflucan.  [place] nursing team will not follow, but will remain available to this patient, the nursing and medical teams.  Please re-consult if needed. Thanks, Maudie Flakes, MSN, RN, [place], Arther Abbott  Pager# [phone]

## 2017-02-18 NOTE — Progress Notes (Signed)
Right lower extremity venous duplex has been completed. Negative for DVT.  02/18/17 8:59 AM Carlos Levering RVT

## 2017-02-18 NOTE — Progress Notes (Signed)
PHARMACY NOTE - Antibiotic Therapy  Pharmacy has been assisting with dosing of Unasyn for Aspiration pneumonia. Vancomycin and Cefepime have been d/c. Dosage remains stable at Unasyn 1.5 mg IV q6h and need for further dosage adjustment appears unlikely at present.    Pharmacy will sign off at this time.  Please reconsult if a change in clinical status warrants re-evaluation of dosage.  Gretta Arab PharmD, BCPS Pager [phone] 02/18/2017 3:30 PM

## 2017-02-18 NOTE — Care Management Note (Signed)
Case Management Note  Patient Details  Name: [name] [name] MRN: [id number] Date of Birth: 01-20-1923  Subjective/Objective:                  Bi-pap/hospice care/pallative care  ActiDate: February 18, 2017 Velva Harman, BSN, [place], [place] Chart and notes review for patient progress and needs. Will follow for case management and discharge needs. Next review date: [id number] on/Plan:   Expected Discharge Date:  (unknown)               Expected Discharge Plan:     In-House Referral:     Discharge planning Services     Post Acute Care Choice:  Home Health, Hospice Choice offered to:  Patient  DME Arranged:    DME Agency:     HH Arranged:  RN, PT, OT, Nurse's Aide, Speech Therapy, Social Work [employer] Agency:  [facility]  Status of Service:  In process, will continue to follow  If discussed at Long Length of Stay Meetings, dates discussed:    Additional Comments:  Leeroy Cha, RN 02/18/2017, 8:13 AM

## 2017-02-18 NOTE — Progress Notes (Signed)
PROGRESS NOTE    [name] [name]  OEV:[id number] DOB: 1923/02/25 DOA: 02/17/2017 PCP: Cassandria Anger, MD    Brief Narrative:  [name] [name] is [name] [name] 83 y.o. male with medical history significant of heart failure s/p ICD placement, history of B cell lymphoma in remission, CKD, anemia, hypothyroidism, cirrhosis, esophageal dysmotility presenting with worsening shortness of breath and confusion.  History is obtained with assistance from the patient's family as the patient was on BiPAP when I was in the room.  Patient's daughter and son-in-law notes that since discharge she has not seemed any better.  They note that he is constantly coughing and aspirating.  His daughter notes that he has been able to take meds, but has not been able to take much as far as p.o. intake.  His daughter asked him if he wanted to come to the hospital in the middle last week due to respiratory distress, but the patient declined.  Yesterday his daughter noticed increased respiratory distress, which seemed better in the afternoon.  They also noticed confusion which seemed worse today.  Patient was noted to be out of it and mumbling incoherently at times.  His swallowing has not been any better since discharge.  They deny any known fevers, but note increased sleepiness recently as well as persistent cough which has been progressively worse.  He also has that left-sided rib pain which has been persistent since his fall.  He was recently discharged for admission where he was treated for presumptive pneumonia and urinary tract infection.  He also was evaluated by GI for dysphasia.  He had [name] [name] Botox injection during that admission for ill-defined motility disorder.  Assessment & Plan:   Principal Problem:   Pneumonia Active Problems:   Hypothyroidism   Essential hypertension   Leukocytosis   Respiratory failure with hypoxia (HCC)   Delirium   Pressure injury of skin   Goals of care  Comfort Care :  Pursing hospice  with comfort measures at this time  Appreciate assistance of palliative care, continue abx as noted below until discharge.   Acute Hypoxic Respiratory Failure  HCAP:  Initially on NRB and briefly on bipap, but now on 5 L by [place]. X ray with small L pleural effusion and L basilar opacity which may represent atelectasis vs pneumonia as well as mild interstitial pulm edema.  Currently treating for HCAP with broad spectrum abx, covering for aspiration with patient hx with flagyl.  Vanc/cefepime/flagyl 11/26 -  Follow up blood cx (obtained after abx) Bipap d/c'd given concern for productive cough and aspiration Urine strep, legionella, and sputum cx ordered After discussion with palliative, plan for transition to hospice.  Will continue abx for now (narrowed to unasyn per palliative) and allow [name] [name] to eat for comfort.    Hyponatremia:  In setting of decreased PO intake with his dysphagia, suspect this is reflective of hypovolemia, though picture difficult with his history of heart failure.  Has elevated BNP, though unclear baseline.  Improved slightly with IVF overnight (received 250 cc bolus then MIVF, plus fluids with abx). [ ]  no further labs with comfort care  Heart Failure with Reduced EF (EF 30-35%):  BNP elevated and mild interstitial edema on x ray.   Does not appear grossly volume overloaded and story with decreased PO intake suggested against this as well.  Not on diuretic as outpatient.   - comfort measures as above  Ill defined motility disorder  Dysphagia  Aspiration:  S/p botox injection by  GI.  Patient has not notably improved since discharge and with continued aspiration.   Allowing PO for comfort  Leukocytosis: follow up blood and urine cx (after abx).  Improved today.    Right LEE: RLE swollen more than L, no [place] completed yet, will order d dimer and if positive.  Hep gtt not started overnight, but d dimer positive, will try to get [place] early and hold off on hep gtt until  complete if not Amous Crewe long delay. [ ]  [place] negative for DVT  Hypothyroidism: hold synthroid   Idiopathic hepatic cirrhosis: stable  Frequent Falls with non displaced C1 ring fracture: (has also had L sided rib pain since fall as well) - conservative management  - analgesics  Urinary Retention: Foley in place since last admission, as removing foley to f/u UA/cx, can trial without foley at this time as well - flomax when able to take PO  Dementia  Delirium:  Likely due to acute illness, but delirium seemed better when I reevaluated in ICU.   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  Anemia: continue to monitor  Prolonged QT: avoid QT prolonging meds  Pressure Ulcer: wound care c/s  DVT prophylaxis: comfort care Code Status: DNR Family Communication: son in law, daughter Disposition Plan: hospice  Consultants:   Palliative care  Procedures: (Don't include imaging studies which can be auto populated. Include things that cannot be auto populated i.e. Echo, Carotid and venous dopplers, Foley, Bipap, HD, tubes/drains, wound vac, central lines etc) LE [place] Right: No cystic structure found in the popliteal fossa. There is no evidence of deep vein thrombosis in the lower extremity.There is no evidence of superficial venous thrombosis. Left: No evidence of common femoral vein obstruction. No cystic structure found in the popliteal fossa.  Antimicrobials: (specify start and planned stop date. Auto populated tables are space occupying and do not give end dates) Anti-infectives (From admission, onward)   Start     Dose/Rate Route Frequency Ordered Stop   02/19/17 1000  vancomycin (VANCOCIN) 1,250 mg in sodium chloride 0.9 % 250 mL IVPB  Status:  Discontinued     1,250 mg 166.7 mL/hr over 90 Minutes Intravenous Every 48 hours 02/17/17 1803  02/18/17 1528   02/19/17 0600  ampicillin-sulbactam (UNASYN) 1.5 g in sodium chloride 0.9 % 50 mL IVPB     1.5 g 100 mL/hr over 30 Minutes Intravenous Every 6 hours 02/18/17 1528     02/18/17 1930  fluconazole (DIFLUCAN) tablet 150 mg     150 mg Oral  Once 02/18/17 1929     02/18/17 1000  ceFEPIme (MAXIPIME) 2 g in dextrose 5 % 50 mL IVPB  Status:  Discontinued     2 g 100 mL/hr over 30 Minutes Intravenous Every 24 hours 02/17/17 1803 02/18/17 1528   02/17/17 1830  metroNIDAZOLE (FLAGYL) IVPB 500 mg  Status:  Discontinued     500 mg 100 mL/hr over 60 Minutes Intravenous Every 8 hours 02/17/17 1735 02/18/17 1200   02/17/17 1530  vancomycin (VANCOCIN) 1,500 mg in sodium chloride 0.9 % 500 mL IVPB     1,500 mg 250 mL/hr over 120 Minutes Intravenous  Once 02/17/17 1448 02/17/17 1725   02/17/17 1445  ceFEPIme (MAXIPIME) 2 g in dextrose 5 % 50 mL IVPB     2 g 100 mL/hr over 30  Minutes Intravenous  Once 02/17/17 1430 02/17/17 1519       Subjective: Hala Narula&Ox3.  Feeling ok this morning.  Breathing better (especially with suction). Still has some rib pain.   Objective: Vitals:   02/17/17 2000 02/17/17 2200 02/18/17 0000 02/18/17 0353  BP: (!) 167/62 (!) 151/64 (!) 155/71   Pulse: 63 66 (!) 57   Resp: 19 (!) 23 (!) 23   Temp:   98.2 F (36.8 C) 98 F (36.7 C)  TempSrc:   Oral Axillary  SpO2: 98% 90% 95%   Weight:        Intake/Output Summary (Last 24 hours) at 02/18/2017 0730 Last data filed at 02/17/2017 2100 Gross per 24 hour  Intake 420 ml  Output 300 ml  Net 120 ml   Filed Weights   02/17/17 1208  Weight: 67.6 kg (149 lb)    Examination:  General exam: Appears calm and comfortable  Respiratory system: Crackles at L lower lung field, diffuse coarse breath sounds.  Cardiovascular system: S1 & S2 heard, RRR. No JVD, murmurs, rubs, gallops or clicks Gastrointestinal system: Abdomen is nondistended, soft and nontender. No organomegaly or masses felt. Normal bowel sounds  heard. Central nervous system: Alert and oriented. No focal neurological deficits. Extremities: RLE edema 1-2+ Skin: pressure ulcer to sacrum Psychiatry: Judgement and insight appear normal. Mood & affect appropriate.     Data Reviewed: I have personally reviewed following labs and imaging studies  CBC: Recent Labs  Lab 02/17/17 1320 02/18/17 0352  WBC 15.0* 10.1  HGB 11.0* 10.6*  HCT 31.3* 30.6*  MCV 89.9 91.3  PLT 201 132   Basic Metabolic Panel: Recent Labs  Lab 02/17/17 1320 02/17/17 1750 02/18/17 0352  NA 119* 121* 123*  K 4.2 4.1 4.1  CL 87* 88* 92*  CO2 24 23 21*  GLUCOSE 94 89 69  BUN 16 16 16   CREATININE 1.25* 1.22 1.11  CALCIUM 8.1* 7.9* 8.0*   GFR: Estimated Creatinine Clearance: 35.6 mL/min (by C-G formula based on SCr of 1.11 mg/dL). Liver Function Tests: Recent Labs  Lab 02/18/17 0352  AST 30  ALT 23  ALKPHOS 260*  BILITOT 1.3*  PROT 5.8*  ALBUMIN 2.9*   No results for input(s): LIPASE, AMYLASE in the last 168 hours. No results for input(s): AMMONIA in the last 168 hours. Coagulation Profile: No results for input(s): INR, PROTIME in the last 168 hours. Cardiac Enzymes: No results for input(s): CKTOTAL, CKMB, CKMBINDEX, TROPONINI in the last 168 hours. BNP (last 3 results) No results for input(s): PROBNP in the last 8760 hours. HbA1C: No results for input(s): HGBA1C in the last 72 hours. CBG: No results for input(s): GLUCAP in the last 168 hours. Lipid Profile: No results for input(s): CHOL, HDL, LDLCALC, TRIG, CHOLHDL, LDLDIRECT in the last 72 hours. Thyroid Function Tests: No results for input(s): TSH, T4TOTAL, FREET4, T3FREE, THYROIDAB in the last 72 hours. Anemia Panel: No results for input(s): VITAMINB12, FOLATE, FERRITIN, TIBC, IRON, RETICCTPCT in the last 72 hours. Sepsis Labs: No results for input(s): PROCALCITON, LATICACIDVEN in the last 168 hours.  Recent Results (from the past 240 hour(s))  Culture, blood (routine x 2)      Status: None   Collection Time: 02/08/17 10:22 AM  Result Value Ref Range Status   Specimen Description BLOOD LEFT ANTECUBITAL  Final   Special Requests   Final    BOTTLES DRAWN AEROBIC AND ANAEROBIC Blood Culture adequate volume   Culture   Final    NO  GROWTH 5 DAYS Performed at [facility], [place] [address] [place], [place] [address]    Report Status 02/13/2017 FINAL  Final  Culture, blood (routine x 2)     Status: None   Collection Time: 02/08/17 10:26 AM  Result Value Ref Range Status   Specimen Description BLOOD BLOOD RIGHT FOREARM  Final   Special Requests   Final    BOTTLES DRAWN AEROBIC AND ANAEROBIC Blood Culture adequate volume   Culture   Final    NO GROWTH 5 DAYS Performed at [facility], [place] [address] [place], [place] [address]    Report Status 02/13/2017 FINAL  Final  Culture, Urine     Status: None   Collection Time: 02/08/17  4:20 PM  Result Value Ref Range Status   Specimen Description URINE, CLEAN CATCH  Final   Special Requests NONE  Final   Culture   Final    NO GROWTH Performed at [facility], [place] [address] [place],  [address]    Report Status 02/10/2017 FINAL  Final  MRSA PCR Screening     Status: Abnormal   Collection Time: 02/17/17  5:25 PM  Result Value Ref Range Status   MRSA by PCR POSITIVE (Priti Consoli) NEGATIVE Final    Comment:        The GeneXpert MRSA Assay (FDA approved for NASAL specimens only), is one component of [facility] Masur comprehensive MRSA colonization surveillance program. It is not intended to diagnose MRSA infection nor to guide or monitor treatment for MRSA infections. RESULT CALLED TO, READ BACK BY AND VERIFIED WITH: Victorino Dike [id number] @ 1903 BY J SCOTTON          Radiology Studies: Dg Chest Port 1 View  Result Date: 02/17/2017 CLINICAL DATA:  83 y/o M; congestive heart failure and shortness of breath. EXAM: PORTABLE CHEST 1 VIEW COMPARISON:  02/08/2017 chest radiograph. FINDINGS: Stable cardiac  silhouette given projection and technique. Two lead AICD noted. Left pleural effusion and basilar opacity. Interstitial edema. Aortic atherosclerosis with calcification. No acute osseous abnormality is evident. IMPRESSION: Small left pleural effusion and left basilar opacity which may represent associated atelectasis or pneumonia. Mild interstitial pulmonary edema. Electronically Signed   By: Kristine Garbe M.D.   On: 02/17/2017 13:44        Scheduled Meds: . acetaminophen  500 mg Oral BID  . Chlorhexidine Gluconate Cloth  6 each Topical Q0600  . donepezil  10 mg Oral QHS  . enoxaparin (LOVENOX) injection  30 mg Subcutaneous Q24H  . fluticasone  2 spray Each Nare Daily  . isosorbide dinitrate  5 mg Oral TID  . levothyroxine  112 mcg Oral QAC breakfast  . mupirocin ointment  1 application Nasal BID  . pantoprazole  40 mg Oral Daily  . tamsulosin  0.4 mg Oral QPM   Continuous Infusions: . sodium chloride 75 mL/hr at 02/17/17 2100  . ceFEPime (MAXIPIME) IV    . metronidazole 500 mg (02/18/17 0555)  . [START ON 02/19/2017] vancomycin       LOS: 1 day    Time spent: over 20 min    Fayrene Helper, MD [facility] Pager [phone]  If 7PM-7AM, please contact night-coverage [email or web address] Password [email or web address] 02/18/2017, 7:30 AM

## 2017-02-19 DIAGNOSIS — T17908A Unspecified foreign body in respiratory tract, part unspecified causing other injury, initial encounter: Secondary | ICD-10-CM

## 2017-02-19 DIAGNOSIS — T17908D Unspecified foreign body in respiratory tract, part unspecified causing other injury, subsequent encounter: Secondary | ICD-10-CM

## 2017-02-19 DIAGNOSIS — Z7189 Other specified counseling: Secondary | ICD-10-CM

## 2017-02-19 MED ORDER — FLUCONAZOLE 100 MG PO TABS
150.0000 mg | ORAL_TABLET | Freq: Once | ORAL | Status: AC
  Administered 2017-02-19: 150 mg via ORAL
  Filled 2017-02-19: qty 2

## 2017-02-19 NOTE — Progress Notes (Addendum)
Pt discharged and will transport to admit to [facility] today- report #[phone].  Completed medical necessity form and arranged transportation via [facility]- family at facility currently doing admission paperwork- aware and agreed to plan. (Spoke with son-in-law Simona Huh via phone)  Sharren Bridge, MSW, LCSW Clinical Social Work 02/19/2017 [phone]

## 2017-02-19 NOTE — Discharge Summary (Signed)
Physician Discharge Summary  KYAL ARTS BOF:[id number] DOB: 12-17-1922 DOA: 02/17/2017  PCP: Cassandria Anger, MD  Admit date: 02/17/2017 Discharge date: 02/19/2017  Time spent: over 30 minutes  Recommendations for Outpatient Follow-up:  1. Follow up adjustments to medications per hospice for comfort   Discharge Diagnoses:  Principal Problem:   Pneumonia Active Problems:   Hypothyroidism   Essential hypertension   Leukocytosis   Respiratory failure with hypoxia (HCC)   Delirium   Pressure injury of skin   HCAP (healthcare-associated pneumonia)   Palliative care encounter   Comfort measures only status   Discharge Condition: stable  Diet recommendation: soft diet, but can be liberalized for comfort  Filed Weights   02/17/17 1208 02/18/17 0900 02/18/17 1836  Weight: 67.6 kg (149 lb) 69.9 kg (154 lb 1.6 oz) 71.5 kg (157 lb 10.1 oz)    History of present illness:  [name] [name] [name] [name] 83 y.o.malewith medical history significant ofheart failure s/p ICD placement, history of B cell lymphoma in remission, CKD, anemia, hypothyroidism, cirrhosis, esophageal dysmotility presenting with worsening shortness of breath and confusion.  History is obtained with assistance from the patient's family as the patient was on BiPAP when I was in the room. Patient's daughter and son-in-law notes that since discharge she has not seemed any better. They note that he is constantly coughing and aspirating. His daughter notes that he has been able to take meds, but has not been able to take much as far as p.o. intake. His daughter asked him if he wanted to come to the hospital in the middle last week due to respiratory distress, but the patient declined. Yesterday his daughter noticed increased respiratory distress, which seemed better in the afternoon. They also noticed confusion which seemed worse today. Patient was noted to be out of it and mumbling incoherently at times. His  swallowing has not been any better since discharge. They deny any known fevers,but note increased sleepiness recentlyas well as persistent cough which has been progressively worse. He also has that left-sided rib pain which has been persistent since his fall.  He was recently discharged for admission where he was treated for presumptive pneumonia and urinary tract infection. He also was evaluated by GI for dysphasia.He had Rykker Coviello Botox injection during that admission for ill-defined motility disorder.  He was treated for HCAP/aspiration pneumonia on presentation and after discussion with palliative care, comfort measures were pursued.  He was discharged to [facility] on 02/19/17.  Hospital Course:  Goals of care  Comfort Care :  Pursing hospice with comfort measures at this time  Appreciate assistance of palliative care, antibiotics were discontinued on discharge. [ ]  medications after discharge for comfort per hospice   Acute Hypoxic Respiratory Failure  HCAP:Initially on NRB and briefly on bipap, but now on 5 L by [place]. X ray with small L pleural effusion and L basilar opacity which may represent atelectasis vs pneumonia as well as mild interstitial pulm edema. Initially treated for HCAP with broad spectrum abx, covering for aspiration with patient hx with flagyl.  Vanc/cefepime/flagyl 11/26 - 11/27 and narrowed to unasyn 11/27-11/28.  Abx discontinued at discharge. Follow up blood cx (obtained after abx) Bipap d/c'd on night of admission due to concern for aspiration Antibiotics discontinued at discharge.  Hyponatremia:In setting of decreased PO intake with his dysphagia, suspect this is reflective of hypovolemia, though picture difficult with his history of heart failure. Has elevated BNP, though unclear baseline. Improved slightly with IVF overnight (received  250 cc bolus then MIVF, plus fluids with abx). No further labs with comfort care.  Heart Failure with Reduced  EF (EF 30-35%) with pacemaker:Patient s/p downgrade from DDD ICD to DDD PM.  BNP elevated and mild interstitial edema on x ray. Does not appear grossly volume overloaded and story with decreased PO intake suggested against this as well. Not on diuretic as outpatient.  - comfort measures as above  Ill defined motility disorder  Dysphagia  Aspiration: S/p botox injection by GI. Patient has not notably improved since discharge and with continued aspiration.  Allowing PO for comfort  Leukocytosis: no further labs with comfort measures  Right LEE:  [ ]  [place] negative for DVT  Hypothyroidism:synthroid   Idiopathic hepatic cirrhosis:stable  Frequent Falls with non displaced C1 ring fracture:(has also had L sided rib pain since fall as well) - conservative management  - analgesics  Urinary Retention: Foley in place since last admission, as removing foley to f/u UA/cx, can trial without foley at this time as well - flomax   Dementia Delirium:Likely due to acute illness, but delirium seemed better when I reevaluated in ICU. 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  Anemia: continue to monitor  Prolonged DZ:HGDJM QT prolonging meds  Pressure Ulcer: wound care recs provided in d/c information.  Continue to monitor.  Procedures: 11/27 Right: No cystic structure found in the popliteal fossa. There is no evidence of deep vein thrombosis in the lower extremity.There is no evidence of superficial venous thrombosis. Left: No evidence of common femoral vein obstruction. No cystic structure found in the popliteal fossa.  *See table(s) above for measurements and observations.   Consultations:  Palliative care  Discharge Exam: Vitals:   02/18/17 2127 02/19/17 0507  BP: (!) 148/68 (!) 146/60  Pulse: 76 66  Resp: 18 18   Temp: 98.6 F (37 C) 98 F (36.7 C)  SpO2: 94% 100%   Feeling ok.  No complaints today  General: No acute distress. Cardiovascular: Heart sounds show Maida Widger regular rate, and rhythm. No gallops or rubs. No murmurs. No JVD. Lungs: Coarse breath sounds.  Abdomen: Soft, nontender, nondistended with normal active bowel sounds. No masses. No hepatosplenomegaly. Neurological: Alert. Moves all extremities 4 with equal strength. Cranial nerves II through XII grossly intact. Skin: Warm and dry. No rashes or lesions. Extremities: No clubbing or cyanosis. No edema. Pedal pulses 2+. Psychiatric: Mood and affect are normal. Insight and judgment are appropriate.   Discharge Instructions   Discharge Instructions    Discharge diet:   Complete by:  As directed    Soft diet   Discharge instructions   Complete by:  As directed    You were admitted with pneumonia likely due to aspiration.  You received antibiotics while admitted and were discharged to hospice.  It was Terrian Ridlon pleasure to be Janika Jedlicka part of your care team, please let us know if there's any way we can be of additional assistance.   Discharge wound care:   Complete by:  As directed    Wound care to Stage 2 pressure injury on the right buttocks:  Cleanse with NS, pat gently dry.  Cover with silicone foam dressing.  Change twice weekly and PRN soiling or rolling of dressing edges.  Skin care to intertriginous areas of dermatitis in the bilateral inguinal areas:  Cleanse with house skin  cleanser, pat thoroughly dry.  Apply house antimicrobial wicking textile, InterDry Ag+ Kellie Simmering # [phone]) according to the instructions below: Measure and cut length of InterDry Ag+ to fit in skin folds that have skin breakdown Tuck InterDry  Ag+ fabric into skin folds in Florinda Taflinger single layer, allow for 2 inches of overhang from skin edges to allow for wicking to occur May remove to bathe; dry area thoroughly and then tuck into affected areas again  Do not apply any creams or  ointments when using InterDry Ag+ DO NOT THROW AWAY FOR 5 DAYS unless soiled with stool DO NOT [facility] product, this will inactivate the silver in the material  New sheet of Interdry Ag+ should be applied after 5 days of use if patient continues to have skin breakdown   Increase activity slowly   Complete by:  As directed      Current Discharge Medication List    CONTINUE these medications which have NOT CHANGED   Details  acetaminophen (TYLENOL) 500 MG tablet Take 1,000 mg by mouth 2 (two) times daily.     albuterol (PROVENTIL) (2.5 MG/3ML) 0.083% nebulizer solution USE ONE VIAL VIA NEBULIZER FOUR TIMES DAILY AS NEEDED. Qty: 375 mL, Refills: 11    Artificial Tear Ointment (DRY EYES OP) Apply 1 drop to eye daily.    diltiazem 2 % GEL Apply 1 application topically 3 (three) times daily. Qty: 30 g, Refills: 11   Associated Diagnoses: Stenosis, anal canal    diphenoxylate-atropine (LOMOTIL) 2.5-0.025 MG tablet Take 1 tablet by mouth 4 (four) times daily as needed for diarrhea or loose stools. Qty: 60 tablet, Refills: 1    docusate sodium (COLACE) 100 MG capsule Take 100 mg daily as needed by mouth for mild constipation.    donepezil (ARICEPT) 10 MG tablet TAKE 1 TABLET BY MOUTH EVERY NIGHT AT BEDTIME Qty: 90 tablet, Refills: 0    fluticasone (FLONASE) 50 MCG/ACT nasal spray Place 2 sprays into both nostrils daily. Qty: 48 g, Refills: 3    HYDROcodone-acetaminophen (NORCO/VICODIN) 5-325 MG tablet Take 1 tablet every 4 (four) hours as needed by mouth for moderate pain. Qty: 15 tablet, Refills: 0    levothyroxine (SYNTHROID, LEVOTHROID) 112 MCG tablet TAKE 1 TABLET BY MOUTH DAILY Qty: 90 tablet, Refills: 0    pantoprazole (PROTONIX) 40 MG tablet Take 1 tablet (40 mg total) by mouth daily. Qty: 90 tablet, Refills: 1    tamsulosin (FLOMAX) 0.4 MG CAPS capsule Take 1 capsule (0.4 mg total) every evening by mouth. Qty: 30 capsule, Refills: 0    triamcinolone (NASACORT AQ) 55 MCG/ACT  AERO nasal inhaler Place 2 sprays into the nose daily. Qty: 3 Inhaler, Refills: 3    triamcinolone ointment (KENALOG) 0.1 % Apply 1 application topically 2 (two) times daily. Qty: 80 g, Refills: 2    AMBULATORY NON FORMULARY MEDICATION Diltiazem gel 2% Apply small amount pea-sized to anal area 2-3 times Esker Dever day for anal stenosis. Use as needed. Qty: 30 g, Refills: 5   Associated Diagnoses: Stenosis, anal canal      STOP taking these medications     atorvastatin (LIPITOR) 20 MG tablet      calcium carbonate (OSCAL) 1500 (600 Ca) MG TABS tablet      Cholecalciferol (VITAMIN D) 2000 units CAPS      isosorbide dinitrate (ISORDIL) 5 MG tablet      multivitamin-iron-minerals-folic acid (CENTRUM) chewable tablet      nitroGLYCERIN (NITROSTAT) 0.4 MG SL tablet  Allergies  Allergen Reactions  . Aleve [Naproxen Sodium] Other (See Comments)    Bleeding ulcers  . Ibuprofen Other (See Comments)    Bleeding ulcers  . Tramadol     Hallucinations       The results of significant diagnostics from this hospitalization (including imaging, microbiology, ancillary and laboratory) are listed below for reference.    Significant Diagnostic Studies: Dg Chest 2 View  Result Date: 02/08/2017 CLINICAL DATA:  Shortness of breath. Pain in left shoulder after fall 3 weeks ago. EXAM: CHEST  2 VIEW COMPARISON:  February 02, 2017 FINDINGS: Calcified granulomas in the right base. Mild interstitial prominence suggest pulmonary venous congestion. Probable atelectasis in the right mid lung. No other interval changes or acute abnormalities. IMPRESSION: Possible pulmonary venous congestion. Opacity in the right mid lung is likely atelectasis. No other acute interval changes. Electronically Signed   By: Dorise Bullion III M.D   On: 02/08/2017 10:26   Dg Chest 2 View  Result Date: 02/01/2017 CLINICAL DATA:  Increasingly short of breath since fall 3 weeks ago. EXAM: CHEST  2 VIEW COMPARISON:   05/15/2015 FINDINGS: The heart remains normal in size. Aorta rib is tortuous. Mediastinum is rotated to the right limiting evaluation of the mediastinum. Lungs are under aerated. Chronic interstitial changes. Calcified granulomata at the right lung base. Stable thoracic spine. Osteopenia. No pneumothorax or pleural effusion. AICD device is unchanged via left subclavian vein. Chronic sternal deformity. IMPRESSION: No active cardiopulmonary disease. Electronically Signed   By: Marybelle Killings M.D.   On: 02/01/2017 11:54   Dg Ribs Unilateral Left  Result Date: 02/01/2017 CLINICAL DATA:  Increasing SOB since fall 3 weeks ago; left lower rib pain x 3 weeks; hx gastric CA, CHF, HTN; ex smoker quit 40 years ago; EXAM: LEFT RIBS - 2 VIEW COMPARISON:  Current chest radiograph. Previous chest radiograph, 05/15/2015. FINDINGS: No rib fracture or rib lesion.  Bony thorax is demineralized. No left lung consolidation. No pleural effusion or evidence of Suprina Mandeville pneumothorax. IMPRESSION: No rib fracture or rib lesion. Electronically Signed   By: Lajean Manes M.D.   On: 02/01/2017 11:50   Dg Esophagus  Result Date: 02/03/2017 CLINICAL DATA:  Dysphagia. EXAM: ESOPHOGRAM/BARIUM SWALLOW TECHNIQUE: Single contrast examination was performed using  thin barium. FLUOROSCOPY TIME:  Fluoroscopy Time:  2.8 minutes Radiation Exposure Index (if provided by the fluoroscopic device): 46.9 mGy COMPARISON:  None. FINDINGS: The esophagus is slightly tortuous with extensive intermittent spasms with severe spasm just above the gastroesophageal junction. There is extensive to and fro peristalsis with contrast extending back into the oropharynx repeatedly, even in the semi-erect position. There is occasional relaxation of severe spasm in the distal esophagus. The lumen during relaxation is approximately 6-7 mm in diameter. No visible masses. Because of the severe distal spasm, I did not give the patient Tatelyn Vanhecke barium tablet. IMPRESSION: 1. Extensive spasm  throughout the esophagus with severe spasm in the distal esophagus just above the gastroesophageal junction. This area does intermittently relax to Erric Machnik diameter of approximately 6-7 mm. 2. To and fro peristalsis in the upper esophagus with reflux into the oropharynx repeatedly. Electronically Signed   By: Lorriane Shire M.D.   On: 02/03/2017 15:21   Dg Chest Port 1 View  Result Date: 02/17/2017 CLINICAL DATA:  83 y/o M; congestive heart failure and shortness of breath. EXAM: PORTABLE CHEST 1 VIEW COMPARISON:  02/08/2017 chest radiograph. FINDINGS: Stable cardiac silhouette given projection and technique. Two lead AICD noted. Left pleural effusion  and basilar opacity. Interstitial edema. Aortic atherosclerosis with calcification. No acute osseous abnormality is evident. IMPRESSION: Small left pleural effusion and left basilar opacity which may represent associated atelectasis or pneumonia. Mild interstitial pulmonary edema. Electronically Signed   By: Kristine Garbe M.D.   On: 02/17/2017 13:44   Dg Chest Port 1 View  Result Date: 02/02/2017 CLINICAL DATA:  83 year old male with cough and fever. EXAM: PORTABLE CHEST 1 VIEW COMPARISON:  Chest radiograph dated 02/01/2017 FINDINGS: There is shallow inspiration with bibasilar linear atelectasis/ scarring. Stable calcified granuloma at the right lung base. No focal consolidation, pleural effusion, or pneumothorax. The cardiac silhouette is within normal limits. There is atherosclerotic calcification of the aorta. Left pectoral dual lead AICD device. There is osteopenia with degenerative changes of the spine. No acute osseous pathology. IMPRESSION: No active disease. Electronically Signed   By: Anner Crete M.D.   On: 02/02/2017 19:10   Dg Shoulder Left  Result Date: 02/08/2017 CLINICAL DATA:  Pain after fall 3 weeks ago. EXAM: LEFT SHOULDER - 2+ VIEW COMPARISON:  None. FINDINGS: There is no evidence of fracture or dislocation. There is no  evidence of arthropathy or other focal bone abnormality. Soft tissues are unremarkable. IMPRESSION: Negative. Electronically Signed   By: Dorise Bullion III M.D   On: 02/08/2017 10:27   Dg Swallowing Func-speech Pathology  Result Date: 02/03/2017 Objective Swallowing Evaluation: Type of Study: MBS-Modified Barium Swallow Study  Patient Details Name: DRAYK HUMBARGER MRN: [id number] Date of Birth: [birth year] Today's Date: 02/03/2017 Time: SLP Start Time (ACUTE ONLY): 4098 -SLP Stop Time (ACUTE ONLY): 1420 SLP Time Calculation (min) (ACUTE ONLY): 25 min Past Medical History: Past Medical History: Diagnosis Date . Cancer ([place])   Stomach . CHF (congestive heart failure) ([place])  . Collagen vascular disease ([place])  . Hypertension  Past Surgical History: Past Surgical History: Procedure Laterality Date . APPENDECTOMY   . COLONOSCOPY  12/1998, 02/2004  diverticulosois, external hemorrhoids . CRANIOTOMY Left  . ESOPHAGOGASTRODUODENOSCOPY  06/17/2014  Dr. Oneida Alar: 1. Patent stricture at the gastroesophageal junction 2. UGIB Due to multiple  gastric ulcers 3. Moderate Duodentitis. Negative H.pylori . heart disease    permanent pacemaker . HIP FRACTURE SURGERY  2011  [facility]  R. . INGUINAL HERNIA REPAIR   . IR GENERIC HISTORICAL  10/26/2015  IR RADIOLOGIST EVAL & MGMT 10/26/2015 [facility]-INTERV RAD . PACEMAKER INSERTION   HPI: pt is Naziah Portee 83 yo male adm to [facility] with UTI/fever.  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  He does report some difficulties after his stomach surgery in March 2016 to treat his cancer.  CXR negative.  Pt admits to progressive problems with swallowing noted recently but denies sudden onset.  He denies ever having swallow evaluation completed previously.   Subjective: pt awake in chair Assessment / Plan / Recommendation CHL IP CLINICAL IMPRESSIONS 02/03/2017 Clinical Impression Pt presents with functional oral  swallow with pharyngocervical esophageal *suspect primary esophageal* deficits.  He was only tested with Candee Hoon few liquid boluses due to concerns for primary esophageal deficits and need for esophagram.  Premature spillage of boluses into pharynx observed - with swallow triggering at pyriform sinus with thin liquid via tsp.  Trace vallecular/pyriform sinus residual noted with nectar which pt independently dry swallows to clear.  Trace laryngeal penetration of thin before swallow due to decreased timing of laryngeal closure.  Pt did not aspirate but he does demonstrate  backflow of liquid barium into pharynx/open larynx.  Upon esophageal sweep, his esophagus appeared full - ? spasms. Trace laryngeal penetration of thin/nectar backflowed material observed with pt conducting further swallows to help clear.  Suspect he is having some aspiration primarily due to his esophageal deficits.  Esophagram to follow, will follow up.  Educated pt to findings/concerns using teach back.   SLP Visit Diagnosis Dysphagia, pharyngoesophageal phase (R13.14);Dysphagia, unspecified (R13.10) Attention and concentration deficit following -- Frontal lobe and executive function deficit following -- Impact on safety and function Risk for inadequate nutrition/hydration;Moderate aspiration risk   CHL IP TREATMENT RECOMMENDATION 02/03/2017 Treatment Recommendations Therapy as outlined in treatment plan below   Prognosis 02/03/2017 Prognosis for Safe Diet Advancement Guarded Barriers to Reach Goals -- Barriers/Prognosis Comment -- CHL IP DIET RECOMMENDATION 02/03/2017 SLP Diet Recommendations NPO;Ice chips PRN after oral care Liquid Administration via -- Medication Administration -- Compensations -- Postural Changes --   CHL IP OTHER RECOMMENDATIONS 02/03/2017 Recommended Consults Consider esophageal assessment Oral Care Recommendations -- Other Recommendations --   CHL IP FOLLOW UP RECOMMENDATIONS 03/16/2015 Follow up Recommendations None   CHL IP  FREQUENCY AND DURATION 02/03/2017 Speech Therapy Frequency (ACUTE ONLY) min 1 x/week Treatment Duration 1 week      CHL IP ORAL PHASE 02/03/2017 Oral Phase Impaired Oral - Pudding Teaspoon -- Oral - Pudding Cup -- Oral - Honey Teaspoon -- Oral - Honey Cup -- Oral - Nectar Teaspoon -- Oral - Nectar Cup -- Oral - Nectar Straw WFL Oral - Thin Teaspoon WFL Oral - Thin Cup -- Oral - Thin Straw WFL Oral - Puree -- Oral - Mech Soft -- Oral - Regular -- Oral - Multi-Consistency -- Oral - Pill -- Oral Phase - Comment --  CHL IP PHARYNGEAL PHASE 02/03/2017 Pharyngeal Phase WFL Pharyngeal- Pudding Teaspoon -- Pharyngeal -- Pharyngeal- Pudding Cup -- Pharyngeal -- Pharyngeal- Honey Teaspoon -- Pharyngeal -- Pharyngeal- Honey Cup -- Pharyngeal -- Pharyngeal- Nectar Teaspoon -- Pharyngeal -- Pharyngeal- Nectar Cup -- Pharyngeal -- Pharyngeal- Nectar Straw WFL;Delayed swallow initiation-pyriform sinuses;Pharyngeal residue - valleculae;Pharyngeal residue - pyriform Pharyngeal -- Pharyngeal- Thin Teaspoon WFL Pharyngeal -- Pharyngeal- Thin Cup -- Pharyngeal -- Pharyngeal- Thin Straw WFL;Penetration/Apiration after swallow;Penetration/Aspiration before swallow;Reduced airway/laryngeal closure;Reduced laryngeal elevation;Pharyngeal residue - valleculae;Pharyngeal residue - pyriform Pharyngeal Material enters airway, remains ABOVE vocal cords then ejected out;Material enters airway, remains ABOVE vocal cords and not ejected out Pharyngeal- Puree -- Pharyngeal -- Pharyngeal- Mechanical Soft -- Pharyngeal -- Pharyngeal- Regular -- Pharyngeal -- Pharyngeal- Multi-consistency -- Pharyngeal -- Pharyngeal- Pill -- Pharyngeal -- Pharyngeal Comment --  CHL IP CERVICAL ESOPHAGEAL PHASE 02/03/2017 Cervical Esophageal Phase Impaired Pudding Teaspoon -- Pudding Cup -- Honey Teaspoon -- Honey Cup -- Nectar Teaspoon -- Nectar Cup -- Nectar Straw Esophageal backflow into the pharynx;Esophageal backflow into cervical esophagus;Reduced cricopharyngeal  relaxation Thin Teaspoon Esophageal backflow into the pharynx;Esophageal backflow into cervical esophagus Thin Cup -- Thin Straw Esophageal backflow into the pharynx;Esophageal backflow into cervical esophagus Puree -- Mechanical Soft -- Regular -- Multi-consistency -- Pill -- Cervical Esophageal Comment ? reduced relaxation of cricopharyngeus and esophageal backflow into pharynx, pt conducts multiple swallows CHL IP GO 02/03/2017 Functional Assessment Tool Used clinical judgement Functional Limitations Swallowing Swallow Current Status (H2992) CM Swallow Goal Status (E2683) CL Swallow Discharge Status (M1962) (None) Motor Speech Current Status (I2979) (None) Motor Speech Goal Status (G9211) (None) Motor Speech Goal Status (H4174) (None) Spoken Language Comprehension Current Status (Y8144) (None) Spoken Language Comprehension Goal Status (Y1856) (None) Spoken Language Comprehension Discharge Status (  G9161) (None) Spoken Language Expression Current Status [phone]) (None) Spoken Language Expression Goal Status [phone]) (None) Spoken Language Expression Discharge Status [phone]) (None) Attention Current Status [phone]) (None) Attention Goal Status (D3220) (None) Attention Discharge Status [phone]) (None) Memory Current Status (C6237) (None) Memory Goal Status (S2831) (None) Memory Discharge Status (D1761) (None) Voice Current Status (Y0737) (None) Voice Goal Status (T0626) (None) Voice Discharge Status [phone]) (None) Other Speech-Language Pathology Functional Limitation Current Status (O2703) (None) Other Speech-Language Pathology Functional Limitation Goal Status (J0093) (None) Other Speech-Language Pathology Functional Limitation Discharge Status [phone]) (None) Luanna Salk, MS [facility] SLP [phone] Macario Golds 02/03/2017, 4:24 PM               Microbiology: Recent Results (from the past 240 hour(s))  MRSA PCR Screening     Status: Abnormal   Collection Time: 02/17/17  5:25 PM  Result Value Ref Range Status    MRSA by PCR POSITIVE (Shiryl Ruddy) NEGATIVE Final    Comment:        The GeneXpert MRSA Assay (FDA approved for NASAL specimens only), is one component of Cheyrl Buley comprehensive MRSA colonization surveillance program. It is not intended to diagnose MRSA infection nor to guide or monitor treatment for MRSA infections. RESULT CALLED TO, READ BACK BY AND VERIFIED WITH: Victorino Dike [id number] @ 1903 BY J SCOTTON   Culture, blood (routine x 2)     Status: None (Preliminary result)   Collection Time: 02/17/17  5:38 PM  Result Value Ref Range Status   Specimen Description BLOOD RIGHT ARM  Final   Special Requests   Final    BOTTLES DRAWN AEROBIC ONLY Blood Culture adequate volume   Culture   Final    NO GROWTH < 24 HOURS Performed at [facility], [place] [address], [place], [place] [address]    Report Status PENDING  Incomplete  Culture, blood (routine x 2)     Status: None (Preliminary result)   Collection Time: 02/17/17  5:47 PM  Result Value Ref Range Status   Specimen Description BLOOD RIGHT ARM  Final   Special Requests   Final    BOTTLES DRAWN AEROBIC ONLY Blood Culture adequate volume   Culture   Final    NO GROWTH < 24 HOURS Performed at [facility], [place] [address], [place], [place] [address]    Report Status PENDING  Incomplete     Labs: Basic Metabolic Panel: Recent Labs  Lab 02/17/17 1320 02/17/17 1750 02/18/17 0352  NA 119* 121* 123*  K 4.2 4.1 4.1  CL 87* 88* 92*  CO2 24 23 21*  GLUCOSE 94 89 69  BUN 16 16 16   CREATININE 1.25* 1.22 1.11  CALCIUM 8.1* 7.9* 8.0*   Liver Function Tests: Recent Labs  Lab 02/18/17 0352  AST 30  ALT 23  ALKPHOS 260*  BILITOT 1.3*  PROT 5.8*  ALBUMIN 2.9*   No results for input(s): LIPASE, AMYLASE in the last 168 hours. No results for input(s): AMMONIA in the last 168 hours. CBC: Recent Labs  Lab 02/17/17 1320 02/18/17 0352  WBC 15.0* 10.1  HGB 11.0* 10.6*  HCT 31.3* 30.6*  MCV 89.9 91.3  PLT 201 185   Cardiac  Enzymes: No results for input(s): CKTOTAL, CKMB, CKMBINDEX, TROPONINI in the last 168 hours. BNP: BNP (last 3 results) Recent Labs    02/17/17 1320  BNP 481.1*    ProBNP (last 3 results) No results for input(s): PROBNP in the last 8760 hours.  CBG:  No results for input(s): GLUCAP in the last 168 hours.     Signed:  Fayrene Helper MD.  [facility] 02/19/2017, 1:26 PM

## 2017-02-19 NOTE — Progress Notes (Signed)
Report called to [facility] at [facility].

## 2017-02-19 NOTE — Clinical Social Work Note (Signed)
CSW consulted to assist with referring pt to residential hospice care. Pt and family known to CSW from recent admission, after which pt returned home to independent living apartment with increased caregiving support and home health. Spoke with pt's daughter this morning. Explains that following extensive conversations re: pt's prognosis with medical/palliative team, pt and family have opted to pursue full comfort care at residential hospice. Preference for [facility] made referral to [place] and will follow to assist with disposition.  See full assessment 02/07/17 below. Notable changes discussed above.  Sharren Bridge, MSW, LCSW Clinical Social Work 02/19/2017 [phone]    Clinical Social Work Assessment  Patient Details  Name: [name] [name] MRN: [id number] Date of Birth: [birth year]  Date of referral:                  Reason for consult:                   Permission sought to share information with:    Permission granted to share information::     Name::        Agency::     Relationship::     Contact Information:     Housing/Transportation Living arrangements for the past 2 months:    Source of Information:    Patient Interpreter Needed:    Criminal Activity/Legal Involvement Pertinent to Current Situation/Hospitalization:    Significant Relationships:    Lives with:    Do you feel safe going back to the place where you live?    Need for family participation in patient care:     Care giving concerns:  Pt from independent living community- gets home health currently but due to need for supervision for safety at DC is inquiring about how to obtain caregivers.    Social Worker assessment / plan:  CSW consulted to assist with Assisted Living placement questions. Met with pt and his son and daughter in law at bedside. They explain that pt used to live in an assisted living [facility]) in the past but was not happy there and has lived in independent  living community [facility]) for past year. They explain that they are planning at DC for pt to return to [facility] services and hiring caretakers to assist with supervision during first few days/weeks after hospitalization. Family explains that they are wanting to look into ALF placement if the need for assistance and supervision becomes long term as opposed to temporary. CSW provided family with area ALF resources and explained the differing levels of care (IL, ALF, SNF). Pt's family somewhat familiar as pt has been in SNF and ALF previously. They mention being interested in [facility] due to having multiple levels of care there, and CSW provided them with contact information after speaking with admissions. Pt notes that currently he feels comfortable at [facility] but that they are beginning to anticipate ALF being more appropriate in the future.  Plan: returning home to independent living apartment at [place] with home health and caretakers.   Employment status:    Insurance information:    PT Recommendations:    Information / Referral to community resources:     Patient/Family's Response to care:  Appreciative and engaged in care  Patient/Family's Understanding of and Emotional Response to Diagnosis, Current Treatment, and Prognosis:  Demonstrates adequate understanding of treatment and plan.   Emotional Assessment Appearance:    Attitude/Demeanor/Rapport:    Affect (typically observed):  Orientation:    Alcohol / Substance use:    Psych involvement (Current and /or in the community):     Discharge Needs  Concerns to be addressed:    Readmission within the last 30 days:    Current discharge risk:    Barriers to Discharge:      Nila Nephew, LCSW 02/19/2017, 9:39 AM  [phone]

## 2017-02-19 NOTE — Progress Notes (Signed)
No charge note   Per patient and his daughter Marijo File the patient does not have an ICD/defibrillator in place.  He only has a Psychologist, forensic.  There is no need to turn off a Psychologist, forensic.  Florentina Jenny, PA-C Palliative Medicine Pager: [phone]

## 2017-02-19 NOTE — Progress Notes (Signed)
Confirmed with device clinic at [facility] cardiology, that patient does not have ICD, but does have pacemaker.

## 2017-02-19 NOTE — Progress Notes (Signed)
Daily Progress Note   Patient Name: [name] [name]       Date: 02/19/2017 DOB: September 23, 1922  Age: 83 y.o. MRN#: [id number] Attending Physician: Elodia Florence., * Primary Care Physician: Cassandria Anger, MD Admit Date: 02/17/2017  Reason for Consultation/Follow-up: Establishing goals of care follow up  Subjective: Patient awake, comfortable.  Frequently suctioning himself.  He tells me he ate a little apple sauce today.  Grand son and grand dtr in law are at bedside.   We discussed the pace maker / cardiac defibrillator.  I explained that we never turn off pace makers but we have ICDs turned off in order for the patient to avoid being shocked.  The patient insisted that his equipment no longer shocks.  I confirmed this with his daughter [name] [name] - the patient's ICD was removed.  Per [name] [name] patient is going to [facility] hospice today.    Spoke with Dr. Florene Glen about DC meds.  Assessment: 83 yo male with recurrent aspiration pneumonia, severe deconditioning. Admitted with sepsis and aspiration pneumonia.  Family understands that this will keep recurring. They have chosen a comfort care only path.      Patient Profile/HPI:  83 y.o. male  with past medical history of CHF (ICD in place), CKD, B cell lymphoma, cirrhosis, esophageal dysmotility (s/p botox injection), with recent C1 fracture who was admitted on 02/17/2017 with recurrent aspiration pneumonia.  He was in respiratory distress and required BiPAP as well as ICU care.    Length of Stay: 2  Current Medications: Scheduled Meds:  . Chlorhexidine Gluconate Cloth  6 each Topical Q0600  . diclofenac sodium  2 g Topical QID  . fluticasone  2 spray Each Nare Daily  . lidocaine  1 patch Transdermal Q24H  . mupirocin ointment  1  application Nasal BID  . sodium chloride flush  3 mL Intravenous Q12H  . tamsulosin  0.4 mg Oral QPM    Continuous Infusions: . sodium chloride 250 mL (02/19/17 0643)  . ampicillin-sulbactam (UNASYN) IV 1.5 g (02/19/17 1127)    PRN Meds: sodium chloride, acetaminophen, albuterol, antiseptic oral rinse, glycopyrrolate **OR** glycopyrrolate **OR** glycopyrrolate, haloperidol **OR** haloperidol **OR** haloperidol lactate, LORazepam **OR** LORazepam **OR** LORazepam, morphine injection, ondansetron **OR** ondansetron (ZOFRAN) IV, polyvinyl alcohol, sodium chloride flush  Physical Exam  Well developed elderly gentleman Awake, orientated, NAD CV rrr resp wet, but no distress Abdomen thin, nt, nd  Vital Signs: BP (!) 146/60 (BP Location: Right Arm)   Pulse 66   Temp 98 F (36.7 C) (Oral)   Resp 18   Ht 5\' 4"  (1.626 m)   Wt 71.5 kg (157 lb 10.1 oz)   SpO2 100%   BMI 27.06 kg/m  SpO2: SpO2: 100 % O2 Device: O2 Device: Nasal Cannula O2 Flow Rate: O2 Flow Rate (L/min): 5 L/min  Intake/output summary:   Intake/Output Summary (Last 24 hours) at 02/19/2017 1144 Last data filed at 02/19/2017 0500 Gross per 24 hour  Intake 0 ml  Output 600 ml  Net -600 ml   LBM:   Baseline Weight: Weight: 67.6 kg (149 lb) Most recent weight: Weight: 71.5 kg (157 lb 10.1 oz)       Palliative Assessment/Data: 20%    Flowsheet Rows     Most Recent Value  Intake Tab  Referral Department  Hospitalist  Unit at Time of Referral  Med/Surg Unit  Palliative Care Primary Diagnosis  Sepsis/Infectious Disease  Date Notified  02/17/17  Palliative Care Type  New Palliative care  Reason for referral  Clarify Goals of Care, Counsel Regarding Hospice  Date of Admission  02/17/17  Date first seen by Palliative Care  02/18/17  # of days Palliative referral response time  1 Day(s)  # of days IP prior to Palliative referral  0  Clinical Assessment  Psychosocial & Spiritual Assessment  Palliative  Care Outcomes      Patient Active Problem List   Diagnosis Date Noted  . Pressure injury of skin 02/18/2017  . HCAP (healthcare-associated pneumonia)   . Palliative care encounter   . Comfort measures only status   . DNR (do not resuscitate) 02/17/2017  . Pneumonia 02/17/2017  . Respiratory failure with hypoxia ([place]) 02/17/2017  . Delirium 02/17/2017  . Dysphagia   . Abnormality of esophagus   . Hyponatremia 02/02/2017  . Neck pain 01/09/2017  . Acute nonintractable headache 01/09/2017  . Head injury 01/09/2017  . Closed nondisplaced fracture of first cervical vertebra ([place]) 01/09/2017  . Liver cirrhosis ([place]) 06/17/2016  . Chronic kidney disease, stage IV (severe) ([place]) 06/17/2016  . Dry skin dermatitis 06/12/2016  . Preventive measure 12/18/2015  . Elevated alkaline phosphatase level 12/18/2015  . Rash, skin 08/26/2015  . Diarrhea 05/19/2015  . UTI (urinary tract infection) 05/19/2015  . Monocytosis 05/12/2015  . Urinary tract infection, site not specified 04/06/2015  . Stomach pain 04/06/2015  . Cough 03/28/2015  . Pressure ulcer 03/13/2015  . Dyslipidemia 03/12/2015  . Leukocytosis 03/12/2015  . Chronic combined systolic and diastolic CHF (congestive heart failure) ([place]) 03/12/2015  . CKD (chronic kidney disease) stage 3, GFR 30-59 ml/min (HCC) 03/12/2015  . Chronic respiratory failure with hypoxia ([place]) 03/12/2015  . Right femoral fracture, closed, initial encounter 03/12/2015  . Fracture of proximal end of right femur ([place]) 03/11/2015  . History of B-cell lymphoma 10/27/2014  . Thrombocytopenia ([place]) 10/26/2014  . Anemia in chronic renal disease 06/18/2014  . Automatic implantable cardioverter-defibrillator in situ 12/30/2008  . Memory loss 06/21/2008  . LOW BACK PAIN 06/15/2007  . Hypothyroidism 01/08/2007  . Essential hypertension 01/08/2007    Palliative Care Plan    Recommendations/Plan:  DC to hospice house  No ICD to turn off per patient and  daughter  Goals of Care and Additional Recommendations:  Limitations on Scope of Treatment: Full Comfort  Care  Code Status:  DNR  Prognosis:   < 6 weeks due to respiratory failure secondary to recurrent aspiration pneumonia, immobility, severe deconditioning, and minimal PO intake.   Discharge Planning:  Hospice facility  Care plan was discussed with MD, Family, RN  Thank you for allowing the Palliative Medicine Team to assist in the care of this patient.  Total time spent:  35 min     Greater than 50%  of this time was spent counseling and coordinating care related to the above assessment and plan.  Florentina Jenny, PA-C Palliative Medicine  Please contact Palliative MedicineTeam phone at [phone] for questions and concerns between 7 am - 7 pm.   Please see AMION for individual provider pager numbers.

## 2017-02-19 NOTE — Progress Notes (Signed)
   02/19/17 1300  Clinical Encounter Type  Visited With Patient  Visit Type Initial;Psychological support;Spiritual support  Referral From Nurse  Consult/Referral To Chaplain  Spiritual Encounters  Spiritual Needs Prayer;Emotional;Other (Comment) (Pastoral Conversation/Support )  Stress Factors  Patient Stress Factors Family relationships;Health changes   I visited with the patient and provided prayer at the bedside. Nothing of significance to report.   Please, contact Spiritual Care for further assistance.   [place] M.Div.

## 2017-02-19 NOTE — Progress Notes (Signed)
[facility] notified CSW that pt can admit today. Family informed. Will arrange transportation via ambulance at DC.  Sharren Bridge, MSW, LCSW Clinical Social Work 02/19/2017 [phone]

## 2017-02-20 ENCOUNTER — Telehealth: Payer: Self-pay | Admitting: *Deleted

## 2017-02-20 NOTE — Telephone Encounter (Signed)
Pt was on TCM list admitted back to the hosp on 02/17/17 for Pneumonia. He was treated for HCAP/aspiration pneumonia on presentation and after discussion with palliative care, comfort measures were pursued.  He was discharged to [facility] on 02/19/17...Johny Chess

## 2017-02-22 LAB — CULTURE, BLOOD (ROUTINE X 2)
CULTURE: NO GROWTH
CULTURE: NO GROWTH
SPECIAL REQUESTS: ADEQUATE
Special Requests: ADEQUATE

## 2017-02-26 ENCOUNTER — Ambulatory Visit: Payer: Medicare Other | Admitting: Internal Medicine

## 2017-02-26 ENCOUNTER — Telehealth: Payer: Self-pay | Admitting: Cardiology

## 2017-02-26 ENCOUNTER — Encounter: Payer: Medicare Other | Admitting: *Deleted

## 2017-02-26 NOTE — Telephone Encounter (Signed)
LMOVM reminding pt to send remote transmission.   

## 2017-02-28 ENCOUNTER — Encounter: Payer: Self-pay | Admitting: Cardiology

## 2017-03-05 DIAGNOSIS — I495 Sick sinus syndrome: Secondary | ICD-10-CM | POA: Diagnosis not present

## 2017-03-05 DIAGNOSIS — Z87891 Personal history of nicotine dependence: Secondary | ICD-10-CM

## 2017-03-05 DIAGNOSIS — Z466 Encounter for fitting and adjustment of urinary device: Secondary | ICD-10-CM

## 2017-03-05 DIAGNOSIS — J449 Chronic obstructive pulmonary disease, unspecified: Secondary | ICD-10-CM | POA: Diagnosis not present

## 2017-03-05 DIAGNOSIS — S12091D Other nondisplaced fracture of first cervical vertebra, subsequent encounter for fracture with routine healing: Secondary | ICD-10-CM | POA: Diagnosis not present

## 2017-03-05 DIAGNOSIS — N401 Enlarged prostate with lower urinary tract symptoms: Secondary | ICD-10-CM | POA: Diagnosis not present

## 2017-03-05 DIAGNOSIS — Z87440 Personal history of urinary (tract) infections: Secondary | ICD-10-CM | POA: Diagnosis not present

## 2017-03-05 DIAGNOSIS — I5032 Chronic diastolic (congestive) heart failure: Secondary | ICD-10-CM | POA: Diagnosis not present

## 2017-03-05 DIAGNOSIS — Z9181 History of falling: Secondary | ICD-10-CM

## 2017-03-05 DIAGNOSIS — K7460 Unspecified cirrhosis of liver: Secondary | ICD-10-CM | POA: Diagnosis not present

## 2017-03-05 DIAGNOSIS — F0390 Unspecified dementia without behavioral disturbance: Secondary | ICD-10-CM | POA: Diagnosis not present

## 2017-03-05 DIAGNOSIS — Z95810 Presence of automatic (implantable) cardiac defibrillator: Secondary | ICD-10-CM

## 2017-03-05 DIAGNOSIS — N184 Chronic kidney disease, stage 4 (severe): Secondary | ICD-10-CM | POA: Diagnosis not present

## 2017-03-05 DIAGNOSIS — W19XXXD Unspecified fall, subsequent encounter: Secondary | ICD-10-CM

## 2017-03-05 DIAGNOSIS — R338 Other retention of urine: Secondary | ICD-10-CM | POA: Diagnosis not present

## 2017-03-05 DIAGNOSIS — I5023 Acute on chronic systolic (congestive) heart failure: Secondary | ICD-10-CM | POA: Diagnosis not present

## 2017-03-05 DIAGNOSIS — I130 Hypertensive heart and chronic kidney disease with heart failure and stage 1 through stage 4 chronic kidney disease, or unspecified chronic kidney disease: Secondary | ICD-10-CM | POA: Diagnosis not present

## 2017-03-05 DIAGNOSIS — Z77090 Contact with and (suspected) exposure to asbestos: Secondary | ICD-10-CM

## 2017-03-25 DEATH — deceased

## 2017-05-08 ENCOUNTER — Other Ambulatory Visit: Payer: Self-pay | Admitting: Internal Medicine

## 2017-05-08 NOTE — Telephone Encounter (Signed)
Routing to dr plotnikov, please advise, thanks 

## 2017-07-03 ENCOUNTER — Other Ambulatory Visit: Payer: Medicare Other

## 2017-07-03 ENCOUNTER — Ambulatory Visit: Payer: Medicare Other | Admitting: Hematology and Oncology

## 2017-07-03 ENCOUNTER — Telehealth: Payer: Self-pay

## 2017-07-03 ENCOUNTER — Other Ambulatory Visit: Payer: Self-pay | Admitting: Hematology and Oncology

## 2017-07-03 DIAGNOSIS — N184 Chronic kidney disease, stage 4 (severe): Secondary | ICD-10-CM

## 2017-07-03 DIAGNOSIS — I5042 Chronic combined systolic (congestive) and diastolic (congestive) heart failure: Secondary | ICD-10-CM

## 2017-07-03 DIAGNOSIS — Z8572 Personal history of non-Hodgkin lymphomas: Secondary | ICD-10-CM

## 2017-07-03 NOTE — Telephone Encounter (Signed)
Called regarding today's appt with Dr. Alvy Bimler and talked with daughter in law. Patient is deceased. Appointments canceled.

## 2018-07-20 NOTE — Progress Notes (Signed)
REVIEWED.  

## 2019-02-12 IMAGING — CR DG CHEST 2V
2 series · 2 of 2 positions shown · non-contrast
Comparison: 05/15/2015

CLINICAL DATA: Increasingly short of breath since fall 3 weeks ago.

EXAM:
CHEST  2 VIEW

[w chest pa]
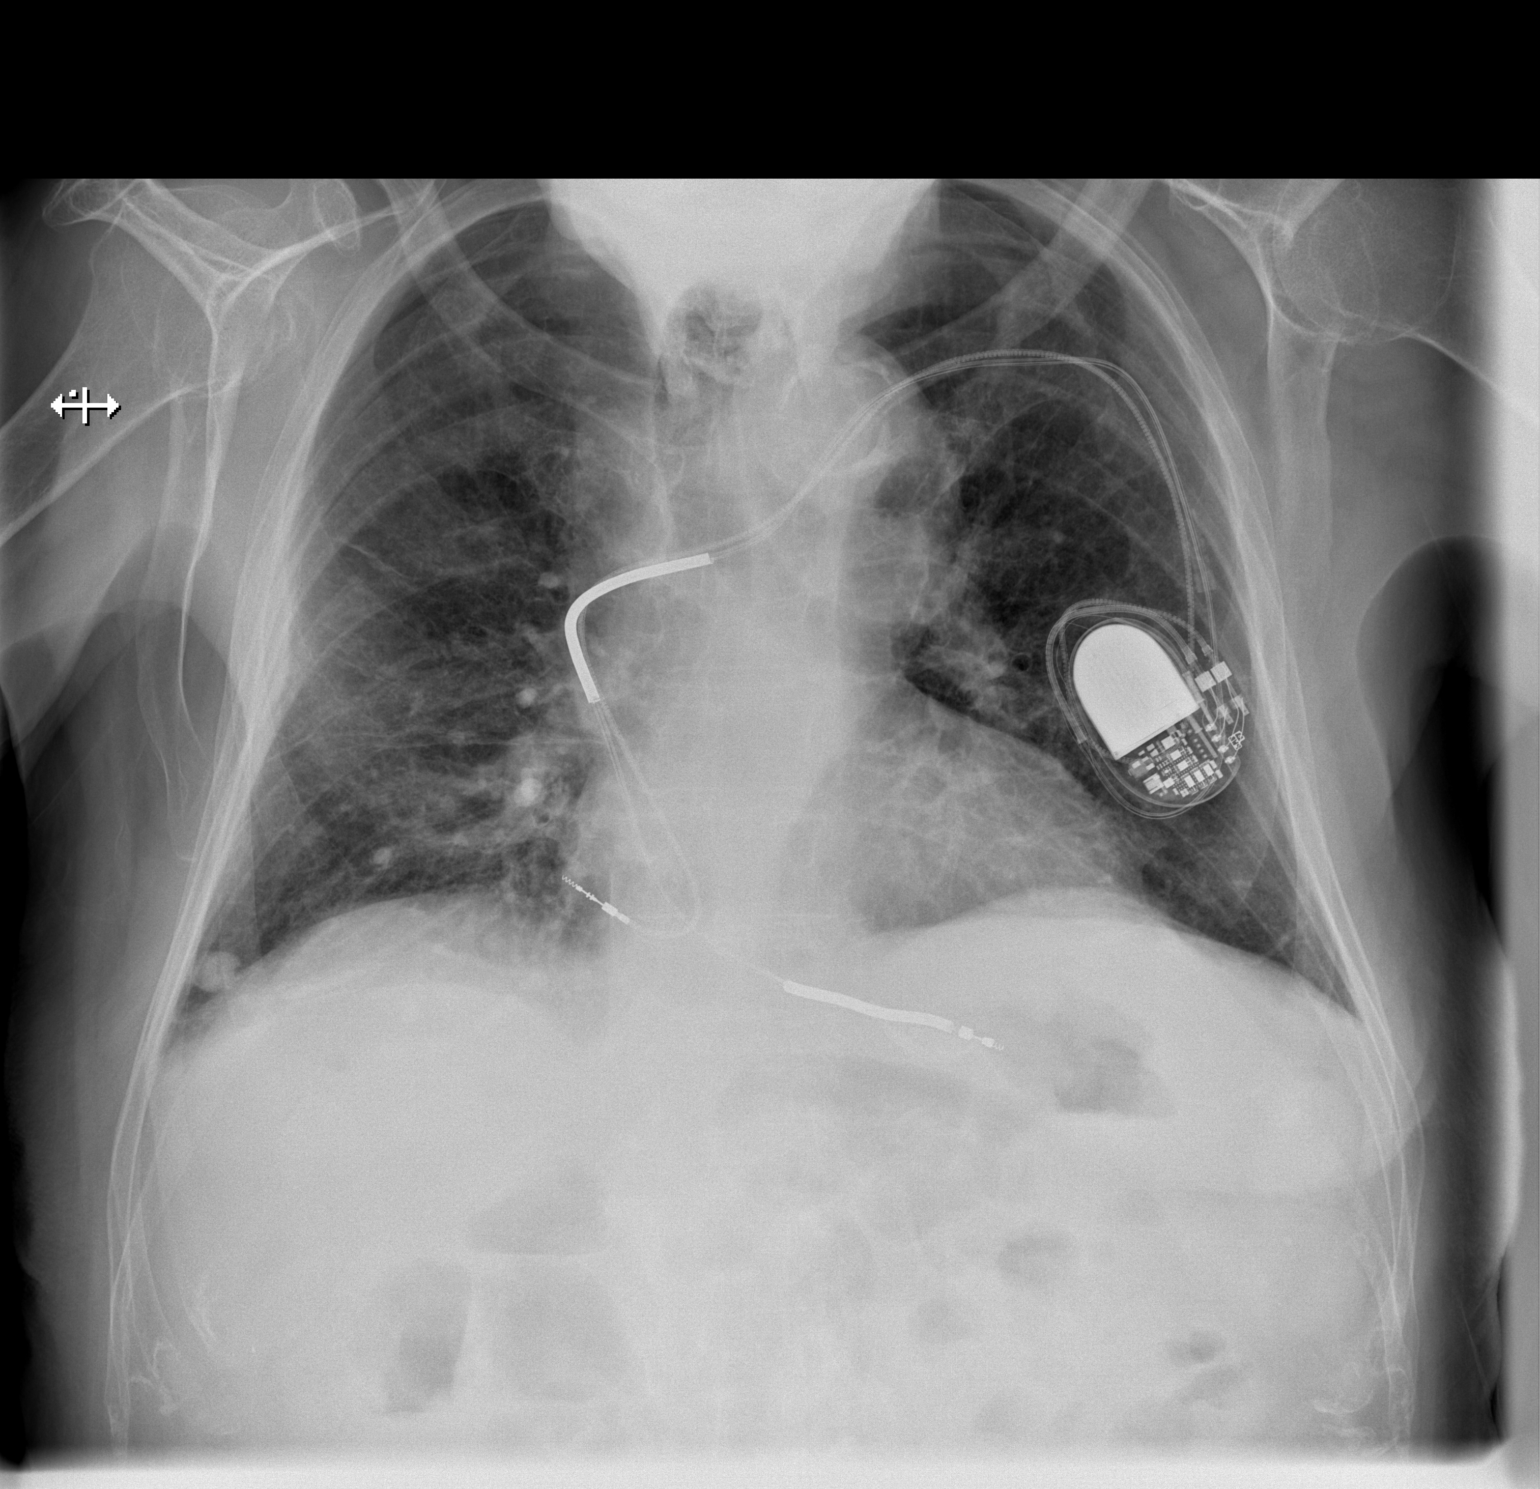

[w chest lat]
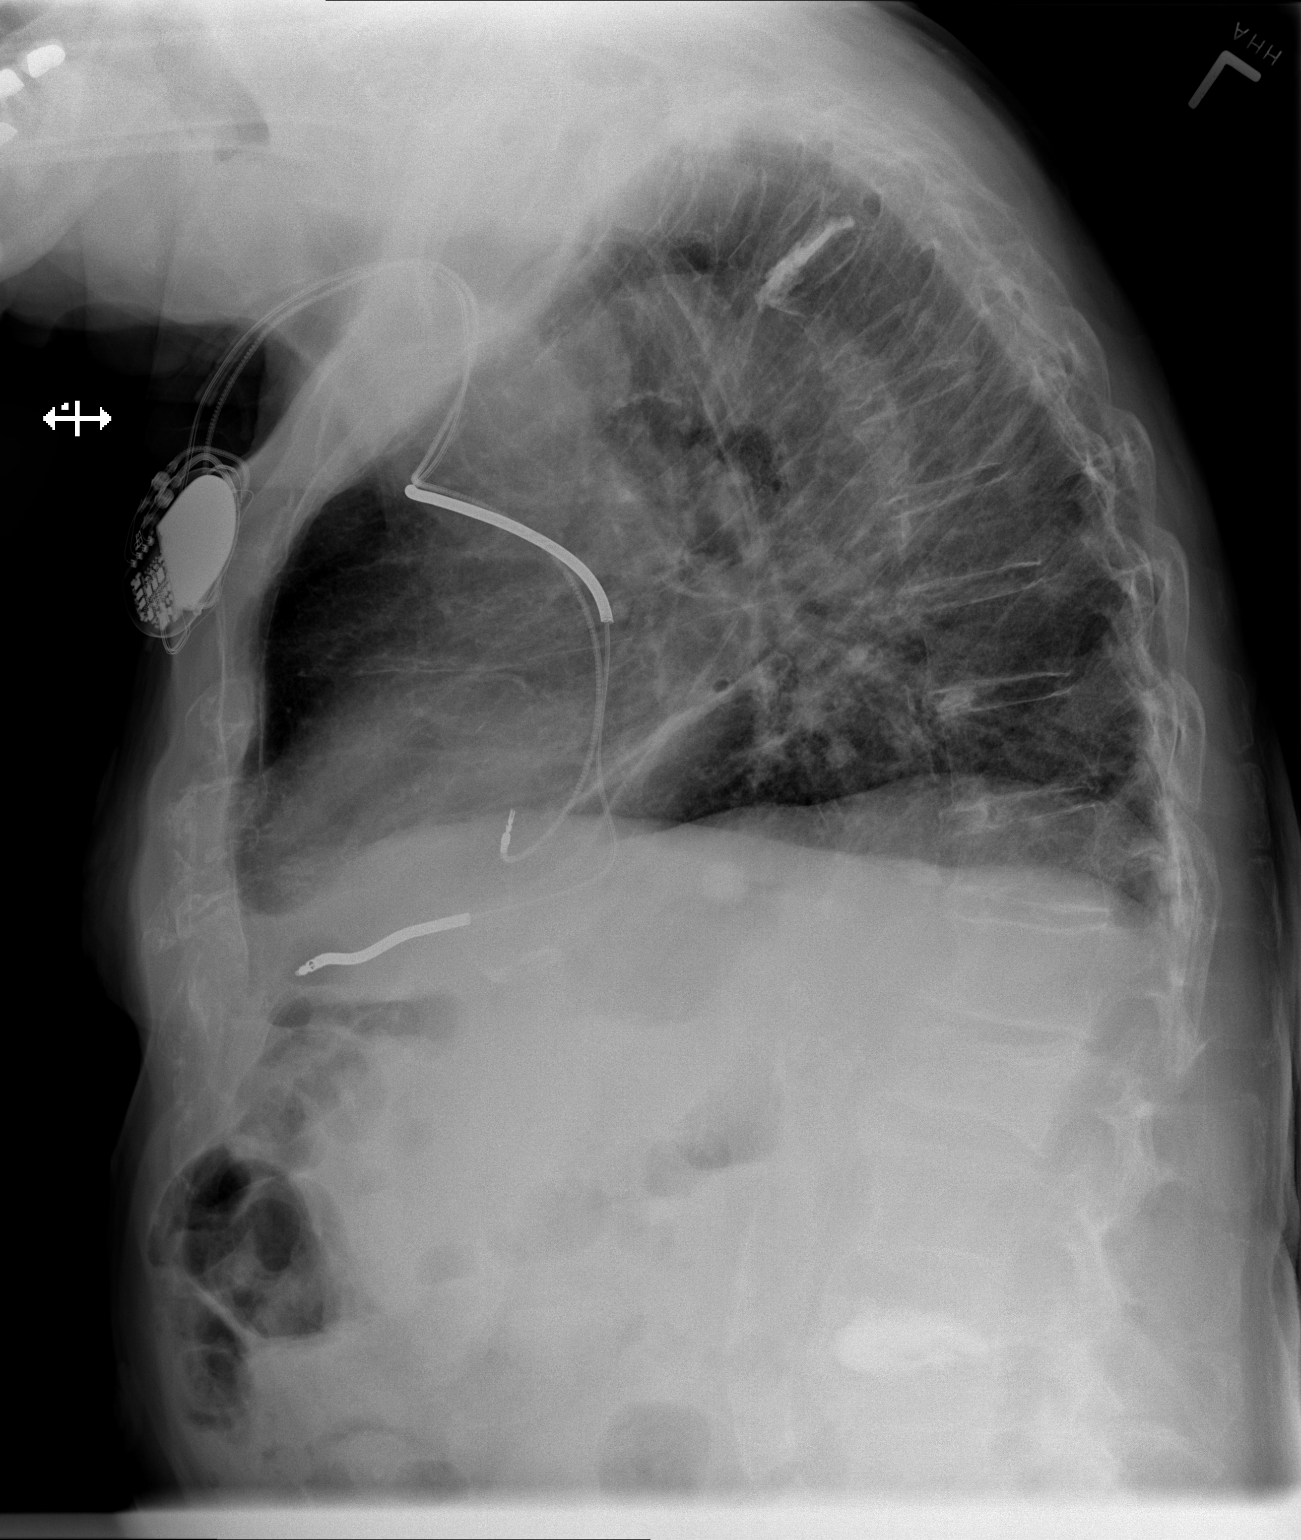

[2 of 2 positions shown; findings below may reference images not displayed]

FINDINGS: The heart remains normal in size. Aorta rib is tortuous. Mediastinum
is rotated to the right limiting evaluation of the mediastinum.
Lungs are under aerated. Chronic interstitial changes. Calcified
granulomata at the right lung base. Stable thoracic spine.
Osteopenia. No pneumothorax or pleural effusion. AICD device is
unchanged via left subclavian vein. Chronic sternal deformity.
IMPRESSION: No active cardiopulmonary disease.

## 2019-02-12 IMAGING — CR DG RIBS 2V*L*
3 series · 3 of 3 positions shown · non-contrast
Comparison: Current chest radiograph. Previous chest radiograph,
05/15/2015.

CLINICAL DATA: Increasing SOB since fall 3 weeks ago; left lower
rib pain x 3 weeks; hx gastric CA, CHF, HTN; ex smoker quit 40 years
ago;

EXAM:
LEFT RIBS - 2 VIEW

[w ribs ap upper left]
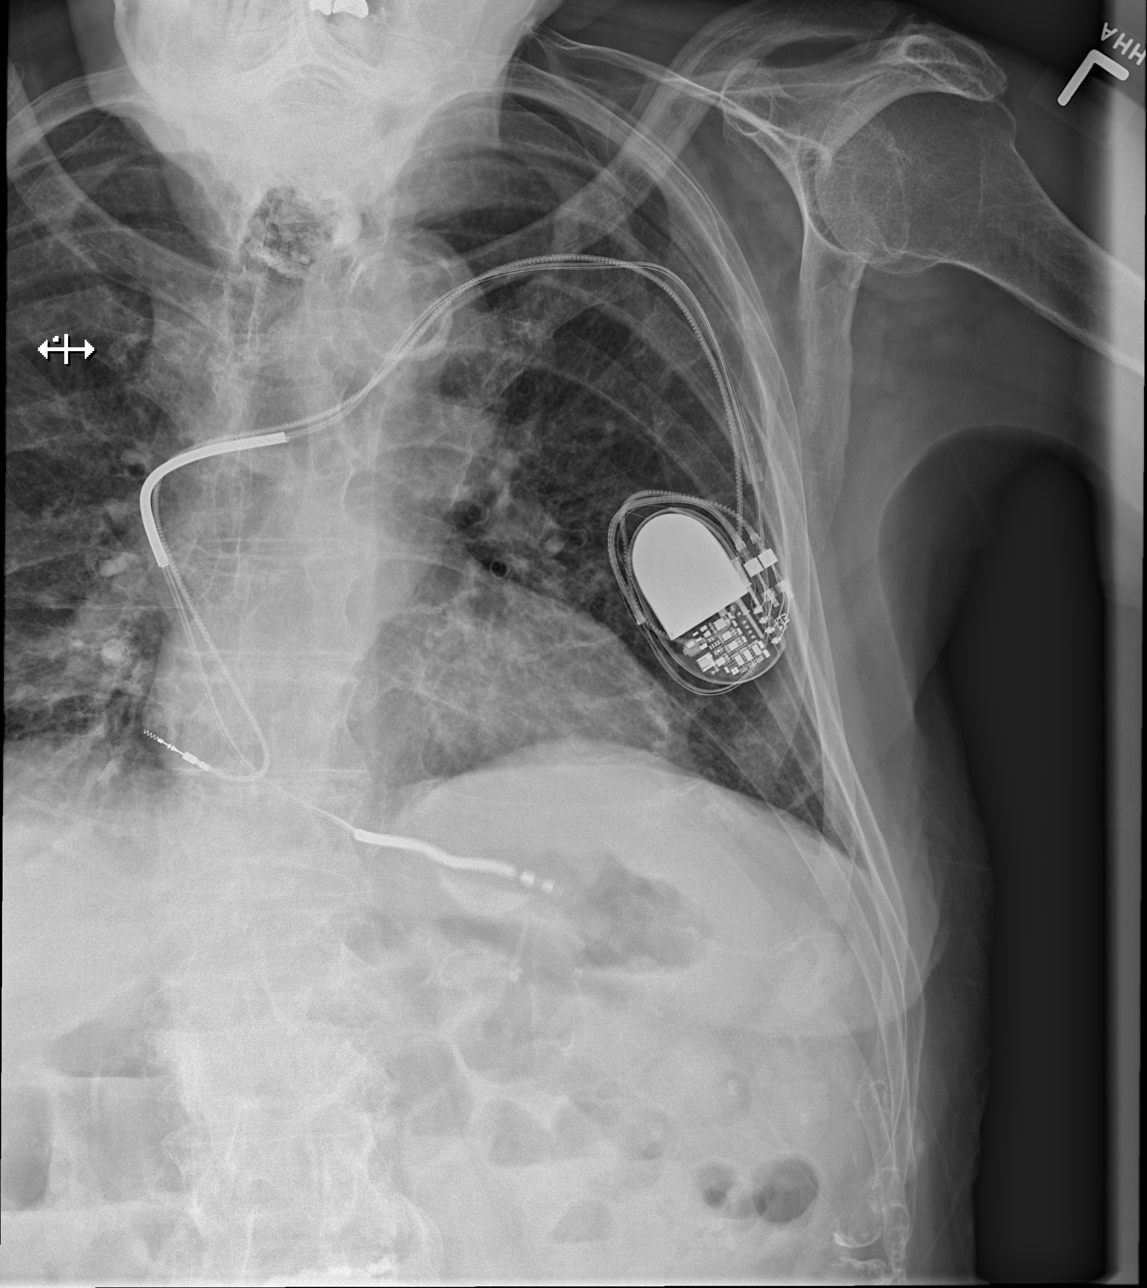

[w ribs ap lower left]
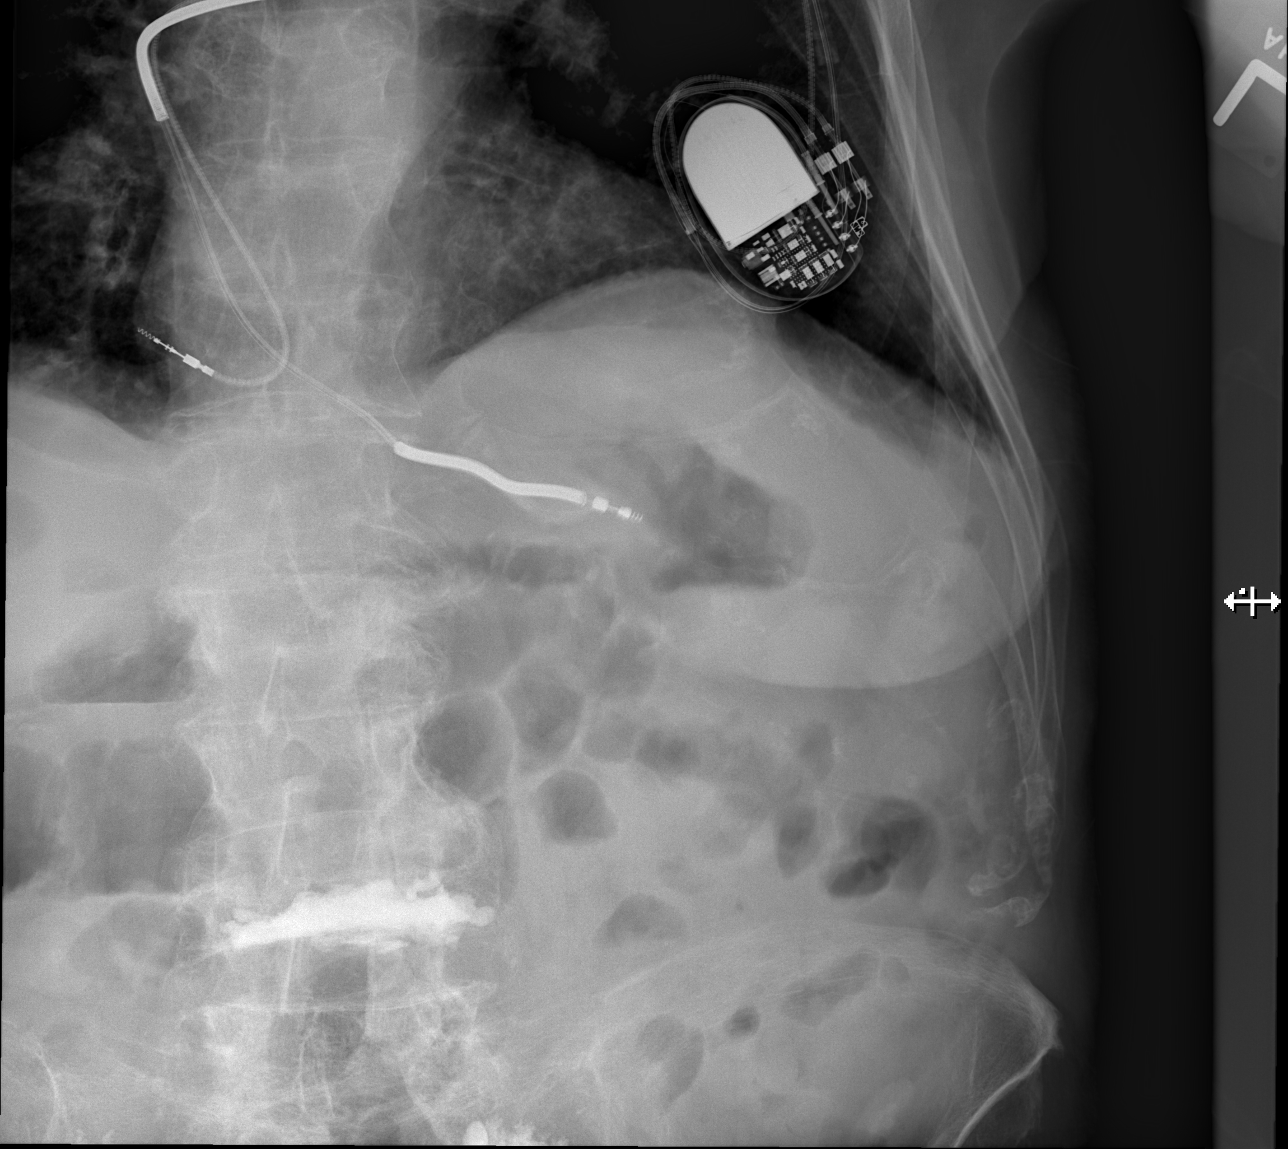

[w ribs obl left]
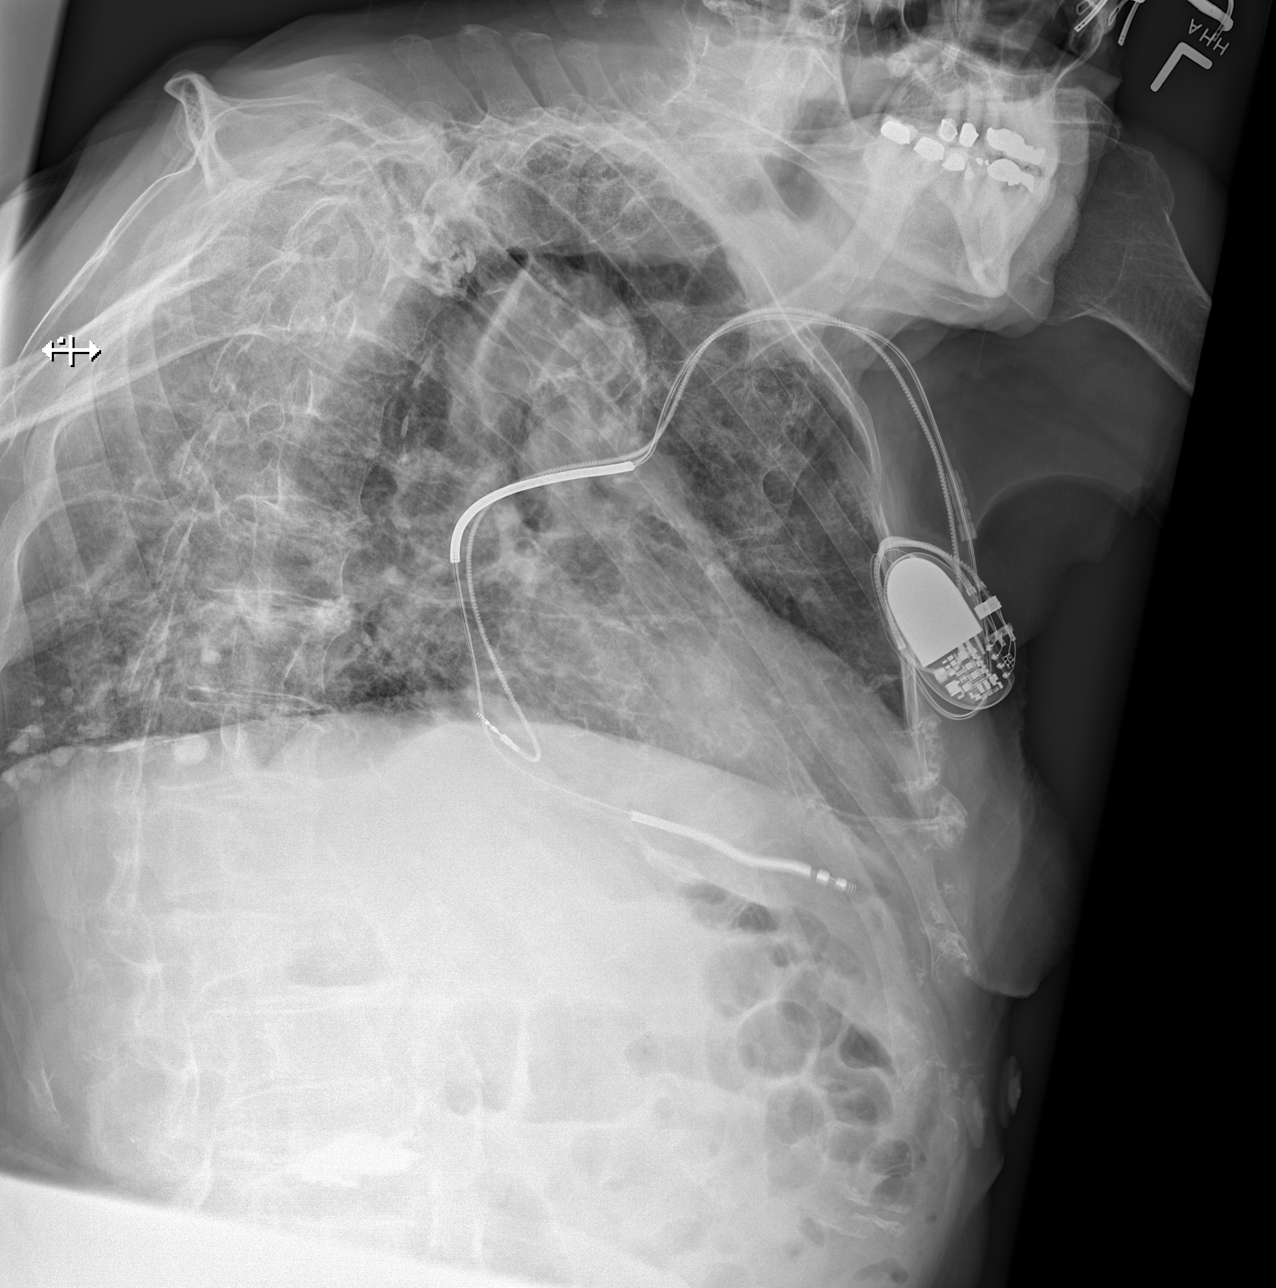

[3 of 3 positions shown; findings below may reference images not displayed]

FINDINGS: No rib fracture or rib lesion.  Bony thorax is demineralized.

No left lung consolidation. No pleural effusion or evidence of a
pneumothorax.
IMPRESSION: No rib fracture or rib lesion.
